# Patient Record
Sex: Female | Born: 1965
Health system: Southern US, Community
[De-identification: ages and names within clinical notes are randomized; demographics above are authoritative.]

## PROBLEM LIST (undated history)

## (undated) DIAGNOSIS — G8929 Other chronic pain: Secondary | ICD-10-CM

## (undated) DIAGNOSIS — IMO0002 Reserved for concepts with insufficient information to code with codable children: Secondary | ICD-10-CM

## (undated) DIAGNOSIS — F419 Anxiety disorder, unspecified: Secondary | ICD-10-CM

## (undated) DIAGNOSIS — L719 Rosacea, unspecified: Secondary | ICD-10-CM

## (undated) DIAGNOSIS — R519 Headache, unspecified: Secondary | ICD-10-CM

## (undated) DIAGNOSIS — M533 Sacrococcygeal disorders, not elsewhere classified: Secondary | ICD-10-CM

## (undated) DIAGNOSIS — N393 Stress incontinence (female) (male): Secondary | ICD-10-CM

## (undated) DIAGNOSIS — Z9989 Dependence on other enabling machines and devices: Secondary | ICD-10-CM

## (undated) DIAGNOSIS — J45998 Other asthma: Secondary | ICD-10-CM

## (undated) DIAGNOSIS — N920 Excessive and frequent menstruation with regular cycle: Secondary | ICD-10-CM

## (undated) DIAGNOSIS — G2581 Restless legs syndrome: Secondary | ICD-10-CM

## (undated) DIAGNOSIS — K219 Gastro-esophageal reflux disease without esophagitis: Secondary | ICD-10-CM

## (undated) DIAGNOSIS — M545 Low back pain, unspecified: Secondary | ICD-10-CM

## (undated) DIAGNOSIS — R112 Nausea with vomiting, unspecified: Secondary | ICD-10-CM

## (undated) DIAGNOSIS — E785 Hyperlipidemia, unspecified: Secondary | ICD-10-CM

## (undated) DIAGNOSIS — N390 Urinary tract infection, site not specified: Secondary | ICD-10-CM

## (undated) DIAGNOSIS — Z87442 Personal history of urinary calculi: Secondary | ICD-10-CM

## (undated) DIAGNOSIS — R9082 White matter disease, unspecified: Secondary | ICD-10-CM

## (undated) DIAGNOSIS — N3010 Interstitial cystitis (chronic) without hematuria: Secondary | ICD-10-CM

## (undated) DIAGNOSIS — Z8249 Family history of ischemic heart disease and other diseases of the circulatory system: Secondary | ICD-10-CM

## (undated) DIAGNOSIS — M797 Fibromyalgia: Secondary | ICD-10-CM

## (undated) DIAGNOSIS — E119 Type 2 diabetes mellitus without complications: Secondary | ICD-10-CM

## (undated) DIAGNOSIS — K589 Irritable bowel syndrome without diarrhea: Secondary | ICD-10-CM

## (undated) DIAGNOSIS — M1990 Unspecified osteoarthritis, unspecified site: Secondary | ICD-10-CM

## (undated) DIAGNOSIS — Z8659 Personal history of other mental and behavioral disorders: Secondary | ICD-10-CM

## (undated) DIAGNOSIS — H01009 Unspecified blepharitis unspecified eye, unspecified eyelid: Secondary | ICD-10-CM

## (undated) DIAGNOSIS — F329 Major depressive disorder, single episode, unspecified: Secondary | ICD-10-CM

## (undated) DIAGNOSIS — J189 Pneumonia, unspecified organism: Secondary | ICD-10-CM

## (undated) DIAGNOSIS — Z9889 Other specified postprocedural states: Secondary | ICD-10-CM

## (undated) DIAGNOSIS — G4733 Obstructive sleep apnea (adult) (pediatric): Secondary | ICD-10-CM

## (undated) DIAGNOSIS — R51 Headache: Secondary | ICD-10-CM

## (undated) DIAGNOSIS — F32A Depression, unspecified: Secondary | ICD-10-CM

## (undated) DIAGNOSIS — M329 Systemic lupus erythematosus, unspecified: Secondary | ICD-10-CM

## (undated) HISTORY — DX: Pneumonia, unspecified organism: J18.9

## (undated) HISTORY — DX: Family history of ischemic heart disease and other diseases of the circulatory system: Z82.49

## (undated) HISTORY — DX: Gastro-esophageal reflux disease without esophagitis: K21.9

## (undated) HISTORY — DX: Other chronic pain: G89.29

## (undated) HISTORY — DX: Unspecified blepharitis unspecified eye, unspecified eyelid: H01.009

## (undated) HISTORY — PX: TONSILLECTOMY: SUR1361

## (undated) HISTORY — DX: Depression, unspecified: F32.A

## (undated) HISTORY — DX: Fibromyalgia: M79.7

## (undated) HISTORY — DX: Low back pain, unspecified: M54.50

## (undated) HISTORY — DX: Type 2 diabetes mellitus without complications: E11.9

## (undated) HISTORY — DX: Irritable bowel syndrome, unspecified: K58.9

## (undated) HISTORY — DX: Restless legs syndrome: G25.81

## (undated) HISTORY — DX: Anxiety disorder, unspecified: F41.9

## (undated) HISTORY — DX: White matter disease, unspecified: R90.82

## (undated) HISTORY — DX: Interstitial cystitis (chronic) without hematuria: N30.10

## (undated) HISTORY — PX: ELBOW SURGERY: SHX618

## (undated) HISTORY — DX: Major depressive disorder, single episode, unspecified: F32.9

## (undated) HISTORY — DX: Personal history of urinary calculi: Z87.442

## (undated) HISTORY — DX: Hyperlipidemia, unspecified: E78.5

## (undated) HISTORY — DX: Unspecified osteoarthritis, unspecified site: M19.90

## (undated) HISTORY — DX: Rosacea, unspecified: L71.9

## (undated) HISTORY — PX: OTHER SURGICAL HISTORY: SHX169

## (undated) HISTORY — DX: Low back pain: M54.5

---

## 1993-09-23 HISTORY — PX: TUBAL LIGATION: SHX77

## 2002-09-23 HISTORY — PX: SHOULDER OPEN ROTATOR CUFF REPAIR: SHX2407

## 2002-11-11 ENCOUNTER — Encounter: Admission: RE | Admit: 2002-11-11 | Discharge: 2002-11-11 | Payer: Self-pay

## 2003-09-24 HISTORY — PX: OTHER SURGICAL HISTORY: SHX169

## 2004-08-12 ENCOUNTER — Emergency Department: Payer: Self-pay | Admitting: Unknown Physician Specialty

## 2004-10-15 ENCOUNTER — Encounter
Admission: RE | Admit: 2004-10-15 | Discharge: 2005-01-13 | Payer: Self-pay | Admitting: Physical Medicine & Rehabilitation

## 2004-10-17 ENCOUNTER — Ambulatory Visit: Payer: Self-pay | Admitting: Physical Medicine & Rehabilitation

## 2004-12-14 ENCOUNTER — Ambulatory Visit: Payer: Self-pay | Admitting: Physical Medicine & Rehabilitation

## 2004-12-28 ENCOUNTER — Encounter
Admission: RE | Admit: 2004-12-28 | Discharge: 2005-03-28 | Payer: Self-pay | Admitting: Physical Medicine & Rehabilitation

## 2004-12-28 ENCOUNTER — Ambulatory Visit: Payer: Self-pay | Admitting: Psychology

## 2005-01-23 ENCOUNTER — Encounter
Admission: RE | Admit: 2005-01-23 | Discharge: 2005-04-23 | Payer: Self-pay | Admitting: Physical Medicine & Rehabilitation

## 2005-02-05 ENCOUNTER — Ambulatory Visit: Payer: Self-pay | Admitting: Physical Medicine & Rehabilitation

## 2005-02-07 ENCOUNTER — Ambulatory Visit: Payer: Self-pay | Admitting: Psychology

## 2005-03-15 ENCOUNTER — Ambulatory Visit: Payer: Self-pay | Admitting: Psychology

## 2005-04-04 ENCOUNTER — Ambulatory Visit: Payer: Self-pay | Admitting: Physical Medicine & Rehabilitation

## 2005-04-17 ENCOUNTER — Ambulatory Visit: Payer: Self-pay | Admitting: Psychology

## 2005-04-29 ENCOUNTER — Ambulatory Visit: Payer: Self-pay | Admitting: Psychology

## 2005-05-17 ENCOUNTER — Encounter
Admission: RE | Admit: 2005-05-17 | Discharge: 2005-08-15 | Payer: Self-pay | Admitting: Physical Medicine & Rehabilitation

## 2005-05-17 ENCOUNTER — Ambulatory Visit: Payer: Self-pay | Admitting: Physical Medicine & Rehabilitation

## 2005-05-22 ENCOUNTER — Encounter
Admission: RE | Admit: 2005-05-22 | Discharge: 2005-08-20 | Payer: Self-pay | Admitting: Physical Medicine & Rehabilitation

## 2005-06-12 ENCOUNTER — Ambulatory Visit: Payer: Self-pay | Admitting: Psychology

## 2005-07-08 ENCOUNTER — Ambulatory Visit: Payer: Self-pay | Admitting: Psychology

## 2005-08-02 ENCOUNTER — Ambulatory Visit: Payer: Self-pay | Admitting: Psychology

## 2005-08-26 ENCOUNTER — Encounter
Admission: RE | Admit: 2005-08-26 | Discharge: 2005-11-24 | Payer: Self-pay | Admitting: Physical Medicine & Rehabilitation

## 2005-08-26 ENCOUNTER — Ambulatory Visit: Payer: Self-pay | Admitting: Physical Medicine & Rehabilitation

## 2005-10-31 ENCOUNTER — Ambulatory Visit: Payer: Self-pay | Admitting: Physical Medicine & Rehabilitation

## 2005-12-05 ENCOUNTER — Encounter
Admission: RE | Admit: 2005-12-05 | Discharge: 2006-03-05 | Payer: Self-pay | Admitting: Physical Medicine & Rehabilitation

## 2005-12-05 ENCOUNTER — Ambulatory Visit: Payer: Self-pay | Admitting: Physical Medicine & Rehabilitation

## 2006-01-02 ENCOUNTER — Ambulatory Visit: Payer: Self-pay | Admitting: Physical Medicine & Rehabilitation

## 2006-01-30 ENCOUNTER — Ambulatory Visit: Payer: Self-pay | Admitting: Cardiology

## 2006-02-04 ENCOUNTER — Ambulatory Visit: Payer: Self-pay | Admitting: Cardiology

## 2006-03-04 ENCOUNTER — Encounter: Payer: Self-pay | Admitting: Cardiology

## 2006-03-04 ENCOUNTER — Ambulatory Visit: Payer: Self-pay

## 2006-03-06 ENCOUNTER — Encounter
Admission: RE | Admit: 2006-03-06 | Discharge: 2006-06-04 | Payer: Self-pay | Admitting: Physical Medicine & Rehabilitation

## 2006-03-06 ENCOUNTER — Ambulatory Visit: Payer: Self-pay | Admitting: Physical Medicine & Rehabilitation

## 2006-03-07 ENCOUNTER — Ambulatory Visit: Payer: Self-pay | Admitting: Cardiology

## 2006-04-29 ENCOUNTER — Ambulatory Visit: Payer: Self-pay | Admitting: Physical Medicine & Rehabilitation

## 2006-06-07 HISTORY — PX: TRANSTHORACIC ECHOCARDIOGRAM: SHX275

## 2006-06-27 ENCOUNTER — Encounter
Admission: RE | Admit: 2006-06-27 | Discharge: 2006-09-25 | Payer: Self-pay | Admitting: Physical Medicine & Rehabilitation

## 2006-06-27 ENCOUNTER — Ambulatory Visit: Payer: Self-pay | Admitting: Physical Medicine & Rehabilitation

## 2006-08-22 ENCOUNTER — Ambulatory Visit: Payer: Self-pay | Admitting: Physical Medicine & Rehabilitation

## 2006-10-17 ENCOUNTER — Encounter
Admission: RE | Admit: 2006-10-17 | Discharge: 2007-01-15 | Payer: Self-pay | Admitting: Physical Medicine & Rehabilitation

## 2006-10-17 ENCOUNTER — Ambulatory Visit: Payer: Self-pay | Admitting: Physical Medicine & Rehabilitation

## 2006-10-27 ENCOUNTER — Ambulatory Visit (HOSPITAL_BASED_OUTPATIENT_CLINIC_OR_DEPARTMENT_OTHER): Admission: RE | Admit: 2006-10-27 | Discharge: 2006-10-27 | Payer: Self-pay | Admitting: Urology

## 2006-11-03 ENCOUNTER — Emergency Department: Payer: Self-pay | Admitting: Emergency Medicine

## 2006-11-05 ENCOUNTER — Ambulatory Visit: Payer: Self-pay | Admitting: Family Medicine

## 2007-01-14 ENCOUNTER — Ambulatory Visit: Payer: Self-pay | Admitting: Physical Medicine & Rehabilitation

## 2007-01-14 ENCOUNTER — Encounter
Admission: RE | Admit: 2007-01-14 | Discharge: 2007-04-14 | Payer: Self-pay | Admitting: Physical Medicine & Rehabilitation

## 2007-01-28 ENCOUNTER — Encounter
Admission: RE | Admit: 2007-01-28 | Discharge: 2007-04-28 | Payer: Self-pay | Admitting: Physical Medicine & Rehabilitation

## 2007-02-13 ENCOUNTER — Ambulatory Visit: Payer: Self-pay | Admitting: Psychology

## 2007-02-13 ENCOUNTER — Encounter: Admission: RE | Admit: 2007-02-13 | Discharge: 2007-05-14 | Payer: Self-pay | Admitting: Physician Assistant

## 2007-04-01 ENCOUNTER — Ambulatory Visit: Payer: Self-pay | Admitting: Psychology

## 2007-04-13 ENCOUNTER — Encounter
Admission: RE | Admit: 2007-04-13 | Discharge: 2007-04-14 | Payer: Self-pay | Admitting: Physical Medicine & Rehabilitation

## 2007-04-14 ENCOUNTER — Ambulatory Visit: Payer: Self-pay | Admitting: Physical Medicine & Rehabilitation

## 2007-04-16 ENCOUNTER — Encounter: Admission: RE | Admit: 2007-04-16 | Discharge: 2007-07-15 | Payer: Self-pay | Admitting: Psychology

## 2007-07-09 ENCOUNTER — Encounter
Admission: RE | Admit: 2007-07-09 | Discharge: 2007-09-01 | Payer: Self-pay | Admitting: Physical Medicine & Rehabilitation

## 2007-07-09 ENCOUNTER — Ambulatory Visit: Payer: Self-pay | Admitting: Physical Medicine & Rehabilitation

## 2007-10-01 ENCOUNTER — Encounter
Admission: RE | Admit: 2007-10-01 | Discharge: 2007-12-30 | Payer: Self-pay | Admitting: Physical Medicine & Rehabilitation

## 2007-10-01 ENCOUNTER — Ambulatory Visit: Payer: Self-pay | Admitting: Physical Medicine & Rehabilitation

## 2007-11-12 ENCOUNTER — Encounter: Admission: RE | Admit: 2007-11-12 | Discharge: 2008-02-10 | Payer: Self-pay | Admitting: Psychology

## 2007-11-12 ENCOUNTER — Ambulatory Visit: Payer: Self-pay | Admitting: Psychology

## 2007-11-25 ENCOUNTER — Ambulatory Visit: Payer: Self-pay | Admitting: Physical Medicine & Rehabilitation

## 2007-11-27 ENCOUNTER — Encounter: Payer: Self-pay | Admitting: Internal Medicine

## 2007-12-24 ENCOUNTER — Encounter
Admission: RE | Admit: 2007-12-24 | Discharge: 2007-12-24 | Payer: Self-pay | Admitting: Physical Medicine & Rehabilitation

## 2007-12-29 ENCOUNTER — Ambulatory Visit: Payer: Self-pay | Admitting: Physical Medicine & Rehabilitation

## 2007-12-30 ENCOUNTER — Ambulatory Visit (HOSPITAL_BASED_OUTPATIENT_CLINIC_OR_DEPARTMENT_OTHER): Admission: RE | Admit: 2007-12-30 | Discharge: 2007-12-30 | Payer: Self-pay | Admitting: Urology

## 2008-02-09 ENCOUNTER — Ambulatory Visit: Payer: Self-pay | Admitting: Psychology

## 2008-03-18 ENCOUNTER — Encounter
Admission: RE | Admit: 2008-03-18 | Discharge: 2008-04-13 | Payer: Self-pay | Admitting: Physical Medicine & Rehabilitation

## 2008-03-22 ENCOUNTER — Ambulatory Visit: Payer: Self-pay | Admitting: Physical Medicine & Rehabilitation

## 2008-05-23 ENCOUNTER — Encounter
Admission: RE | Admit: 2008-05-23 | Discharge: 2008-05-24 | Payer: Self-pay | Admitting: Physical Medicine & Rehabilitation

## 2008-05-24 ENCOUNTER — Ambulatory Visit: Payer: Self-pay | Admitting: Physical Medicine & Rehabilitation

## 2008-07-21 ENCOUNTER — Encounter: Admission: RE | Admit: 2008-07-21 | Discharge: 2008-10-19 | Payer: Self-pay | Admitting: Psychology

## 2008-07-21 ENCOUNTER — Ambulatory Visit: Payer: Self-pay | Admitting: Psychology

## 2008-09-12 ENCOUNTER — Encounter
Admission: RE | Admit: 2008-09-12 | Discharge: 2008-09-13 | Payer: Self-pay | Admitting: Physical Medicine & Rehabilitation

## 2008-09-13 ENCOUNTER — Ambulatory Visit: Payer: Self-pay | Admitting: Physical Medicine & Rehabilitation

## 2008-09-30 ENCOUNTER — Ambulatory Visit: Payer: Self-pay | Admitting: Psychology

## 2008-12-06 ENCOUNTER — Encounter
Admission: RE | Admit: 2008-12-06 | Discharge: 2008-12-07 | Payer: Self-pay | Admitting: Physical Medicine & Rehabilitation

## 2008-12-07 ENCOUNTER — Ambulatory Visit: Payer: Self-pay | Admitting: Physical Medicine & Rehabilitation

## 2009-02-22 ENCOUNTER — Ambulatory Visit: Payer: Self-pay | Admitting: Physical Medicine & Rehabilitation

## 2009-02-22 ENCOUNTER — Encounter
Admission: RE | Admit: 2009-02-22 | Discharge: 2009-05-23 | Payer: Self-pay | Admitting: Physical Medicine & Rehabilitation

## 2009-03-21 ENCOUNTER — Ambulatory Visit: Payer: Self-pay | Admitting: Physical Medicine & Rehabilitation

## 2009-06-06 ENCOUNTER — Encounter
Admission: RE | Admit: 2009-06-06 | Discharge: 2009-06-09 | Payer: Self-pay | Admitting: Physical Medicine & Rehabilitation

## 2009-06-09 ENCOUNTER — Ambulatory Visit: Payer: Self-pay | Admitting: Physical Medicine & Rehabilitation

## 2009-08-14 ENCOUNTER — Encounter
Admission: RE | Admit: 2009-08-14 | Discharge: 2009-09-20 | Payer: Self-pay | Admitting: Physical Medicine & Rehabilitation

## 2009-08-29 ENCOUNTER — Encounter
Admission: RE | Admit: 2009-08-29 | Discharge: 2009-09-20 | Payer: Self-pay | Admitting: Physical Medicine & Rehabilitation

## 2009-08-30 ENCOUNTER — Ambulatory Visit: Payer: Self-pay | Admitting: Physical Medicine & Rehabilitation

## 2009-09-27 ENCOUNTER — Encounter
Admission: RE | Admit: 2009-09-27 | Discharge: 2009-12-26 | Payer: Self-pay | Admitting: Physical Medicine & Rehabilitation

## 2009-11-20 ENCOUNTER — Encounter
Admission: RE | Admit: 2009-11-20 | Discharge: 2009-11-23 | Payer: Self-pay | Admitting: Physical Medicine & Rehabilitation

## 2009-11-22 ENCOUNTER — Ambulatory Visit: Payer: Self-pay | Admitting: Physical Medicine & Rehabilitation

## 2010-03-07 ENCOUNTER — Encounter
Admission: RE | Admit: 2010-03-07 | Discharge: 2010-03-14 | Payer: Self-pay | Admitting: Physical Medicine & Rehabilitation

## 2010-03-14 ENCOUNTER — Ambulatory Visit: Payer: Self-pay | Admitting: Physical Medicine & Rehabilitation

## 2010-06-01 ENCOUNTER — Encounter
Admission: RE | Admit: 2010-06-01 | Discharge: 2010-06-06 | Payer: Self-pay | Source: Home / Self Care | Attending: Physical Medicine & Rehabilitation | Admitting: Physical Medicine & Rehabilitation

## 2010-06-06 ENCOUNTER — Ambulatory Visit: Payer: Self-pay | Admitting: Physical Medicine & Rehabilitation

## 2010-07-16 ENCOUNTER — Ambulatory Visit (HOSPITAL_BASED_OUTPATIENT_CLINIC_OR_DEPARTMENT_OTHER): Admission: RE | Admit: 2010-07-16 | Discharge: 2010-07-16 | Payer: Self-pay | Admitting: Urology

## 2010-09-19 ENCOUNTER — Encounter: Payer: Self-pay | Admitting: Cardiology

## 2010-09-24 ENCOUNTER — Encounter
Admission: RE | Admit: 2010-09-24 | Discharge: 2010-09-26 | Payer: Self-pay | Source: Home / Self Care | Attending: Physical Medicine & Rehabilitation | Admitting: Physical Medicine & Rehabilitation

## 2010-09-26 ENCOUNTER — Ambulatory Visit
Admission: RE | Admit: 2010-09-26 | Discharge: 2010-09-26 | Payer: Self-pay | Source: Home / Self Care | Attending: Physical Medicine & Rehabilitation | Admitting: Physical Medicine & Rehabilitation

## 2010-10-02 ENCOUNTER — Encounter: Payer: Self-pay | Admitting: Cardiology

## 2010-10-03 ENCOUNTER — Ambulatory Visit
Admission: RE | Admit: 2010-10-03 | Discharge: 2010-10-03 | Payer: Self-pay | Source: Home / Self Care | Attending: Cardiology | Admitting: Cardiology

## 2010-10-03 DIAGNOSIS — E669 Obesity, unspecified: Secondary | ICD-10-CM | POA: Insufficient documentation

## 2010-10-03 DIAGNOSIS — F411 Generalized anxiety disorder: Secondary | ICD-10-CM | POA: Insufficient documentation

## 2010-10-03 DIAGNOSIS — R079 Chest pain, unspecified: Secondary | ICD-10-CM | POA: Insufficient documentation

## 2010-10-12 HISTORY — PX: OTHER SURGICAL HISTORY: SHX169

## 2010-10-22 ENCOUNTER — Ambulatory Visit
Admission: RE | Admit: 2010-10-22 | Discharge: 2010-10-22 | Payer: Self-pay | Source: Home / Self Care | Attending: Cardiology | Admitting: Cardiology

## 2010-10-25 NOTE — Miscellaneous (Signed)
  Clinical Lists Changes  Observations: Added new observation of ECHOINTERP:   -  Left ventricular ejection fraction was estimated to be 65 %.         There were no left ventricular regional wall motion         abnormalities.   -  No significant abnormalities noted.   IMPRESSIONS   -  No significant abnormalities noted. (03/04/2006 15:01)      Echocardiogram  Procedure date:  03/04/2006  Findings:        -  Left ventricular ejection fraction was estimated to be 65 %.         There were no left ventricular regional wall motion         abnormalities.   -  No significant abnormalities noted.   IMPRESSIONS   -  No significant abnormalities noted.

## 2010-10-25 NOTE — Letter (Signed)
Summary: [facility]   Imported By: Marylou Mccoy 10/16/2010 15:33:36  _____________________________________________________________________  External Attachment:    Type:   Image     Comment:   External Document

## 2010-10-25 NOTE — Assessment & Plan Note (Signed)
Summary: np6.  chestpain. pt has medicade. gd   Visit Type:  Initial Consult Primary Provider:  Lonie Peak PAc  CC:  chest pain.  History of Present Illness: The patient presents for evaluation of chest pain. She reports that she has had a stress test many years ago for chest pain and also had some palpitations. However, otherwise she has not had significant cardiovascular problems or testing. She reports chest discomfort happening recently 2-3 times. She points to her left-sided discomfort that has been severe. He had tingling down into her left arm. It happens at rest. It is sharp. There is associated diaphoresis and nausea. It eventually will go away after about 15 minutes. It has been 10 out of 10. It is not like previous reflux. She cannot bring this on. She has not taken anything to try to get rid of it.  Current Medications (verified): 1)  Prilosec 20 Mg Cpdr (Omeprazole) .[name].. 1 By Mouth Daily 2)  Bentyl 20 Mg Tabs (Dicyclomine Hcl) .... As Needed 3)  Alprazolam 1 Mg Tabs (Alprazolam) .[name].. 1 By Mouth Daily 4)  Proventil Hfa 108 (90 Base) Mcg/act Aers (Albuterol Sulfate) .... As Needed 5)  Cetirizine Hcl 10 Mg Tabs (Cetirizine Hcl) .[name].. 1 By Mouth Daily 6)  Phenergan 25 Mg/ml Soln (Promethazine Hcl) .... As Needed 7)  Meclizine Hcl 25 Mg Tabs (Meclizine Hcl) .[name].. 1 As Needed 8)  Flexeril 10 Mg Tabs (Cyclobenzaprine Hcl) .... As Needed 9)  Pulmicort Flexhaler 180 Mcg/act Aepb (Budesonide) .... Uad 10)  Maxalt 10 Mg Tabs (Rizatriptan Benzoate) .... As Needed 11)  Topamax 50 Mg Tabs (Topiramate) .[name].. 1 By Mouth Daily 12)  Dhea 25 Mg Tabs (Prasterone (Dhea)) .... 2 By Mouth Daily 13)  Magnesium 250 Mg Tabs (Magnesium) .[name].. 1 By Mouth Daily 14)  Celexa 20 Mg Tabs (Citalopram Hydrobromide) .[name].. 1 By Mouth Daily 15)  Vitamin D 2000 Unit Tabs (Cholecalciferol) .[name].. 1 By Mouth Daily 16)  Norco 10-325 Mg Tabs (Hydrocodone-Acetaminophen) .... As Needed  Allergies (verified): No Known Drug  Allergies  Past History:  Past Medical History: Anxiety/depression Fibromyalgia Interstial cystitis Nephrolithiasis  Past Surgical History: Tubal ligation Rotator cuff surgery Cystoscopies  Family History: The patient has no family history of early onset coronary disease though she does not know her father's medical history.  Social History: Patient lives at home with her parents and 3 children. She has never smoked cigarettes. She does not drink.  Review of Systems       Positive for headaches, dizziness, cough, reflux, nighttime leg cramping  Vital Signs:  Patient profile:   45 year old female Height:      63 inches Weight:      231 pounds BMI:     41.07 Pulse rate:   80 / minute Resp:     16 per minute BP sitting:   131 / 94  (right arm)  Vitals Entered By: Marrion Coy, CNA (October 03, 2010 2:37 PM)  Physical Exam  General:  Well developed, well nourished, in no acute distress. Head:  normocephalic and atraumatic Eyes:  PERRLA/EOM intact; conjunctiva and lids normal. Mouth:  Poor dentition otherwise unremarkable Neck:  Neck supple, no JVD. No masses, thyromegaly or abnormal cervical nodes. Chest Wall:  no deformities or breast masses noted Lungs:  Clear bilaterally to auscultation and percussion. Abdomen:  Bowel sounds positive; abdomen soft and non-tender without masses, organomegaly, or hernias noted. No hepatosplenomegaly. Msk:  Back normal, normal gait. Muscle strength and tone normal.  Extremities:  No clubbing or cyanosis. Neurologic:  Alert and oriented x 3. Skin:  Intact without lesions or rashes. Cervical Nodes:  no significant adenopathy Axillary Nodes:  no significant adenopathy Inguinal Nodes:  no significant adenopathy Psych:  Normal affect.   Detailed Cardiovascular Exam  Neck    Carotids: Carotids full and equal bilaterally without bruits.      Neck Veins: Normal, no JVD.    Heart    Inspection: no deformities or lifts noted.       Palpation: normal PMI with no thrills palpable.      Auscultation: regular rate and rhythm, S1, S2 without murmurs, rubs, gallops, or clicks.    Vascular    Abdominal Aorta: no palpable masses, pulsations, or audible bruits.      Femoral Pulses: normal femoral pulses bilaterally.      Pedal Pulses: normal pedal pulses bilaterally.      Radial Pulses: normal radial pulses bilaterally.      Peripheral Circulation: no clubbing, cyanosis, or edema noted with normal capillary refill.     EKG  Procedure date:  09/19/2010  Findings:      Sinus rhythm, rate 70, axis within normal limits, intervals within normal limits, no acute ST-T wave changes.  Impression & Recommendations:  Problem # 1:  CHEST PAIN, PRECORDIAL (ICD-786.51) The chest pain is atypical for the most part. I think screening with an exercise treadmill test is indicated. She is worried about this because she would get dizzy when trying to work with her to get this accomplished. If this is negative I would refer her back to her primary provider for consideration of GI evaluation. Orders: Treadmill (Treadmill)  Problem # 2:  OBESITY, UNSPECIFIED (ICD-278.00) We will further discuss weight loss with diet and exercise.  Problem # 3:  GENERALIZED ANXIETY DISORDER (AVW-098.11) This certainly could contribute to her chest pain. However, in discussing this with her she has wonderful followup and treatment of this.  Patient Instructions: 1)  Your physician recommends that you continue on your current medications as directed. Please refer to the Current Medication list given to you today. 2)  Your physician has requested that you have an exercise tolerance test.  For further information please visit [email or web address]  Please also follow instruction sheet, as given.

## 2010-11-07 ENCOUNTER — Telehealth (INDEPENDENT_AMBULATORY_CARE_PROVIDER_SITE_OTHER): Payer: Self-pay | Admitting: *Deleted

## 2010-11-14 NOTE — Progress Notes (Signed)
  Phone Note Other Incoming   Request: Send information Summary of Call: Request for records received from [employer]. Request forwarded to [employer].  09/23/2010 to present.

## 2010-12-05 LAB — POCT PREGNANCY, URINE: Preg Test, Ur: NEGATIVE

## 2010-12-05 LAB — POCT HEMOGLOBIN-HEMACUE: Hemoglobin: 13.9 g/dL (ref 12.0–15.0)

## 2011-02-05 NOTE — Assessment & Plan Note (Signed)
Karina Greer is back regarding her fibromyalgia syndrome.  She did not  tolerate the Savella due to bleeding from the mouth and nose as well as  rectal region.  Apparently, after stopping the medication, she had no  further bleeding.  Other workup was negative for sources of bleeding per  her PCP.  She continues the treatment per Dr. [name].  She is getting  Elmiron daily, which is helping somewhat with spasm.  Sleep is somewhat  improved.  She is working on exercise, but really not walking more than  5 or 10 minutes and is inconsistent still.  She has attempted some  socialization, but still has lot of anxiety blocking her way.  She rates  her pain as 7-8/10.  She describes pain is sharp, burning, stabbing,  aching, and constant.  Pain interferes with general activity, in  relationship with others, and enjoyment of life on a moderate-to-severe  level.   REVIEW OF SYSTEMS:  Notable for bladder, throat problems, numbness,  tremor, tingling, depression, confusion, dizziness, and anxiety.  She  has occasional abdominal pain and problem with appetite.  Full review is  in the written health and history section.   SOCIAL HISTORY:  The patient is divorced and lives with her children.  Other pertinent positives are above.   PHYSICAL EXAMINATION:  VITAL SIGNS:  Blood pressure is 134/59, pulse is  77, respiratory rate 18, and she is sating 99% on room air.  GENERAL:  The patient is pleasant, alert, and oriented x3.  Affect is  generally bright.  She did become tearful at times when talking about  her psychological issues, but overall, she showed better awareness  today.  She has some diffuse tenderness along the elbows, wrists,  shoulders, knees, neck, head, etc.  Although, insight was improved  today, memory can be a problem at times overall with better insight and  attention.  She has better recall.  Cranial nerve exam is intact.  Gait  stable.  Weight appeared to be unchanged from last visit.   ASSESSMENT:  1. Fibromyalgia syndrome.  2. Insomnia.  3. Interstitial cystitis.  4. Anxiety/depression.  5. Sacroiliac joint disorder.  6. Irritable bowel syndrome.  7. Costochondritis.   PLAN:  1. Continue exercise.  We discussed short-term and long-term stepwise      approach to her exercise.  I even would like to see her add some      weightlifting in at some point.  She needs to look on this now      rather than waiting.  I recommended keeping records of her      progress, etc.  We also discussed socialization and getting on the      Community, and this needs to begin as well.  She will continue with      Dr. Leonides Cave.  Followup as needed.  2. Follow up with Dr. [name] for urological management.  3. We will maintain Tofranil 150 mg nightly and the ReQuip at bedtime.  4. Refill Norco 10/325, #90.  5. Restart Celexa 20 mg for 3 days, 40 mg for 4 days, and then 60 mg      daily thereafter.  6. I will see her back in 4 months.      [name], M.D.  Electronically Signed     ZTS/MedQ  D:  05/24/2008 10:31:42  T:  05/25/2008 01:35:21  Job #:  [id number]   cc:   [name], M.D.  Fax: [phone]

## 2011-02-05 NOTE — Assessment & Plan Note (Signed)
HISTORY:  Karina Greer is back regarding her fibromyalgia.  She had hydraulic  bladder distention in April by Dr. [name].  She is on Elmiron treatments  now.  She still notes spastic symptoms of her bladder.  She follows up  with Dr. [name] next month.  She sees Dr. Leonides Cave for counseling.  She  has been more engaged in her diagnosis and the reasons behind her  problems.  She seems to be exercising a bit more and has better  realization of things.  She still does not do much socializing.  She  complains of left lower quadrant abdominal pain.  She wonders if it is  her kidneys or gallbladder, etc.  She rates her pain at 8/10 on average,  describes as constant, tingling, and aching.  She reports some  intermittent stabbing and stinging pain to the arms and legs.  Sleep is  poor, which she attributes to the pain in the left lower quadrant at  times.  She also has an anxiety at night.   REVIEW OF SYSTEMS:  Notable for the above as well as dizziness,  confusion, depression, constipation, abdominal pain, and diarrhea.  Full  review is in the written health and history section in the chart.   SOCIAL HISTORY:  The patient is single, living with her children.   PHYSICAL EXAMINATION:  VITAL SIGNS:  Blood pressure is 132/75, pulse 70,  and respiratory rate 20.  She is sating 99% on room air.  Weight is  stable  GENERAL:  The patient is a bit more alert.  MUSCULOSKELETAL:  She is able to ambulate and move without significant  issues today.  She has one topic and has better insight and awareness  overall today.  She has some generalized tenderness in the extremities  and trunk today.  No focal myofascial trigger points.  NEUROLOGICAL:  Cranial nerve exam is intact.   ASSESSMENT:  1. Fibromyalgia syndrome.  2. Insomnia.  3. Interstitial cystitis.  4. Irritable bowel syndrome.  5. Anxiety with depression.  6. Sacroiliac joint dysfunction.  7. Costochondritis.   PLAN:  1. Continue Elmiron per Dr.  [name].  2. Maintain Tofranil and ReQuip at current doses.  3. Norco 10/325 for breakthrough pain.  4. We will wean off Celexa and begin trial of Savella.  She was given      a starter pack.  Ultimately, we will titrate slowly up to 50 mg      b.i.d. if she is able to tolerate.  5. Continue counseling.  I wanted to focus more on goal in social-      related behavior.      [name], M.D.  Electronically Signed    ZTS/MedQ  D:  03/22/2008 10:06:51  T:  03/23/2008 02:34:56  Job #:  [id number]   cc:   [name], M.D.  Fax: [phone]

## 2011-02-05 NOTE — Assessment & Plan Note (Signed)
Karina Greer is back regarding her fibromyalgia.  She has been having more  problems with her interstitial cystitis as of late.  She is receiving  pelvic floor treatments at the outpatient center per Dr. Ellin Goodie  instruction.  She uses the Elmiron bladder washes, too, with some  results.  Sleep is poor.  Her energy is poor as well as her mood.  She  has gone back to seen Dr. Leonides Cave, and she prefers him over her prior  psychologist.  The patient uses Norco for breakthrough pain.  She is  using less Phenergan now due to decreased nausea.   REVIEW OF SYSTEMS:  Notable for the above as well as irritable bowel  symptoms.  Tremor is better with the Requip.  She still has some anxiety  issues, shortness of breath, and weight gain.  She is not eating much  yet still seems to be gaining weight.   SOCIAL HISTORY:  Notable for patient going through a divorce.  She is  living with her children currently.   PHYSICAL EXAMINATION:  Blood pressure is 128/85, pulse is 85,  respiratory rate 18.  She is satting 97% on room air.  The patient is pleasant, alert and oriented x3.  Affect is a bit flat.  She does appear to have gained weight.  She is still a bit anxious at  times as well.  She has tenderness throughout chest, arms, trunk today, etc.  HEART:  Regular.  CHEST:  Clear.  ABDOMEN:  Soft, nontender.   ASSESSMENT:  1. Fibromyalgia syndrome.  2. Insomnia.  3. Severe anxiety and depression.  4. Questionable asthma.  5. Sacroiliac disorder.  6. Interstitial cystitis/irritable bowel syndrome.   PLAN:  1. Will begin a trial of Mestinon 30 mg b.i.d. in the morning and      after lunch to see if this improves stamina and pain tolerance.  We      discussed effects and side effects including nausea, diarrhea,      dizziness, low blood pressure, and bradycardia.  The patient was      willing to try the medication despite these possibilities.  2. Begin a trial of Tofranil for sleep and mood at 75 mg  nightly.  3. Continue Requip at bedtime.  4. Refill Norco 10/325.  5. Continue pelvic floor treatments and followup with Dr. [name].  6. I will see her back in 2 months' time.      [name], M.D.  Electronically Signed     ZTS/MedQ  D:  04/14/2007 09:24:52  T:  04/14/2007 12:09:42  Job #:  [id number]

## 2011-02-05 NOTE — Assessment & Plan Note (Signed)
HISTORY OF PRESENT ILLNESS:  Karina Greer states that she has been doing a bit  better with the Requip. She did not have a good response to the Tofranil  or Mestinon but did not have any adverse effects.  She has had some  recent sternal pain and wonders if it is related to a cough or not.  It  may have just started before her cough worsened. She rates her pain as 7  to 9/10.  Described as sharp, intermittently, constant, tingling.  The  pain interferes with her general activities and relations with others  and enjoyment of life in a moderate to severe level.  She uses Vicodin  for break through pain.  The patient feels limited with her exercise.  She occasionally gets dizzy.  She wants to get back to seeing Dr. Leonides Cave  again but has some issues getting back and forth to his office.   REVIEW OF SYSTEMS:  The patient reports bladder control issues. She is  seeing urology for this.  She needs to follow up with them.  Apparently  she did some pelvic floor exercises which did not make much of a  difference for her. She complains of tremor, trouble walking, spasms,  abdominal pain, poor appetite, shortness of breath.  Other pertinent  positives as listed above.   SOCIAL HISTORY:  The patient is divorced, living with her children.   PHYSICAL EXAMINATION:  VITAL SIGNS:  Blood pressure 140/80.  Pulse 92.  Respiratory rate 18.  She is sating 99 percent on room air.  GENERAL:  The patient is pleasant.  A bit anxious. She is fairly alert.  She is not as negative as she has been today. She is able to redirect  and has fair insight.  MUSCULOSKELETAL:  She has multiple tender points once again throughout.  She has pain in the back and neck with range of motion today.  Her  weight is generally stable.  Trace swelling is appreciated in the limbs.  NEUROLOGICAL: Cognitively she is fair with some attentional issues but  no significant changes otherwise is seen.   ASSESSMENT:  1. Fibromyalgia syndrome.  2.  Insomnia.  3. Severe anxiety and depression.  4. Questionable asthma,.  5. SI joint disorder.  6. History of interstitial cystitis/irritable bowel syndrome.  7. Costochondritis.   PLAN:  1. Will increase Requip to 0.5 mg q.h.s. and add a 0. 25 mg dose in      the day.  2. I would like her to restart the Mestinon at 60 mg in the morning      and noon increasing potentially up to 90 mg depending on response.  3. Resume Tofranil.  4. Follow up with urology regarding her cystitis.  5. Recommended ice, rest, avoidance of exacerbating activities for her      chest.  6. Continue hydrocodone for break through pain (10/325).  7. I will see her back in 3 months time.      [name], M.D.  Electronically Signed    ZTS/MedQ  D:  07/10/2007 16:21:57  T:  07/12/2007 13:39:52  Job #:  [id number]

## 2011-02-05 NOTE — Assessment & Plan Note (Signed)
Karina Greer is back regarding her fibromyalgia.  She is seeing Dr. Leonides Cave for  mood and seems to be coming up with some constructive ideas for  behavioral modification.  She seems to have better awareness and reality  of her situation and her behavior.  She is focusing on positive  productive activities and thinking rather than the negative.  She rates  her pain overall 8/10.  She has issues still with sleeping which she  would like to improve.  Her pain during the day may be a bit better.  Her Oswestry score is 70% today.  Pain interferes with general activity,  relations with others, and enjoyment of life on a moderate-to-severe  level.  She is sleeping 3-4 hours at night.   REVIEW OF SYSTEMS:  Notable for urinary frequency of late.  She is  worried she has a UTI.  Also reports numbness, tremor, tingling,  confusion, and weight loss.  She lost about 15 pounds over the last 3  months.  She has occasional nausea still and problems with appetite.  The full review is in the written health and history section.   SOCIAL HISTORY:  The patient is divorced, living with her kids and  parents.   PHYSICAL EXAMINATION:  VITAL SIGNS:  Blood pressure is 123/83, pulse is  80, and respiratory rate 18.  She is sating 98% on room air.  GENERAL:  The patient is generally pleasant, a bit anxious and tearful  at times, but more appropriate than I have seen her for a while.  She  definitely was focusing on productive thoughts and activities rather  than focusing solely on her pain.  NEUROLOGIC:  She is stable with intact memory.  Cranial nerve exam,  motor function.  Sensory exam is stable.  Weight appeared to be slightly  decreased, although it is difficult to tell with her baggy clothing.  HEART:  Regular.  CHEST:  Clear.  ABDOMEN:  Soft and nontender.   ASSESSMENT:  1. Fibromyalgia syndrome.  2. Anxiety and depression.  The patient seems to have changed gears a      bit regarding her perceptions and  thinking which is a positive      sign.  3. Insomnia.  4. Interstitial cystitis and questionable urinary tract infection.  5. Sacroiliac joint disorder.  6. Irritable bowel syndrome.  7. Costochondritis.   PLAN:  1. Continue counseling with Dr. Leonides Cave and activity in psychosocial      changes which have been discussed at length in the past.      Hopefully, she is moving forward here.  2. We will check UA/CNS.  3. Follow up with Dr. [name] for ongoing urological needs.  4. Increased ReQuip 2 mg nightly to see if we can improve sleep      further before we add another agent.  Continue Tofranil 150 mg at      night.  5. Norco 10/325 one every 6 hours p.r.n. for pain.  6. Celexa 60 mg daily for mood.  7. I will see her back in 3 months.      [name], M.D.  Electronically Signed     ZTS/MedQ  D:  09/13/2008 09:50:22  T:  09/14/2008 03:25:51  Job #:  [id number]   cc:   [name], M.D.  Fax: [phone]

## 2011-02-05 NOTE — Assessment & Plan Note (Signed)
Karina Greer is back regarding her fibromyalgia.  She has actually been doing  a bit better recently.  Requip has helped her restless leg symptoms and  she feels that the Tofranil has helped her sleep a bit.  It does cause  some itching but she uses Zyrtec to help with that.  She is going for a  bladder dilatation with Dr. [name] tomorrow.  She has got a better  insight regarding her fibromyalgia and what in entails.  She has been  checking into fibromyalgia support groups, etc.  Sleep is 4-5 hours a  night.  She uses Norco for breakthrough pain.   REVIEW OF SYSTEMS:  Notable for the above as well as some depression,  anxiety, confusion, bladder control problems, abdominal pain, poor  appetite.  Abdominal pain has since resolved.  She does complain of some  pain on her medial elbows.   SOCIAL HISTORY:  Patient is divorced, living with her children.  Other  pertinent positives are above.   PHYSICAL EXAMINATION:  Blood pressure is 125/81.  Pulse is 90.  Respiratory rate 18.  She is satting 99% on room air.  Patient is  pleasant, a little less anxious than usual.  She is more directed in her  speech and thoughts.  She wonders less from topic to topic.  She had  some tenderness along the ulnar groove on both elbows.  Neither elbow  had a positive Tinel's sign today, however.  Other areas of tenderness  were noted as well.  Weight is generally stable.  Gait was intact.  Cognition was generally appropriate.  She does have some difficulties  with organization and memory still.  Cranial nerves exam is intact.   ASSESSMENT:  1. Fibromyalgia syndrome.  2. Insomnia.  3. Interstitial cystitis.  4. Anxiety, depression.  5. Sacroiliac joint disorder.  6. Irritable bowel syndrome.  7. Costochondritis.   PLAN:  1. Recommended ice, padding and extension of the elbows.  2. Patient will follow up with Dr. [name] tomorrow for the procedure.      Could consider other options including nerve block if she  fails      urological management.  3. Increased Tofranil 150 mg q. h.s. for mood, sleep and pain.      Maintain Requip at 1 mg at bedtime.  4. Refill Norco 10/325 #90.  5. Discusses exercise and more goal-oriented behavior.  I think she is      making some headway in her understanding and realization of her      problem and specific needs.  6. I will see her back in 3 month's time.      [name], M.D.  Electronically Signed     ZTS/MedQ  D:  12/29/2007 09:36:11  T:  12/29/2007 09:57:22  Job #:  [id number]   cc:   [name], M.D.  Fax: [phone]

## 2011-02-05 NOTE — Op Note (Signed)
Karina Greer, Karina Greer               ACCOUNT NO.:  [id number]   MEDICAL RECORD NO.:  [id number]          PATIENT TYPE:  AMB   LOCATION:  [facility]                         FACILITY:  [facility]   PHYSICIAN:  [name], M.D.  DATE OF BIRTH:  17-Jan-1966   DATE OF PROCEDURE:  12/30/2007  DATE OF DISCHARGE:                               OPERATIVE REPORT   PREOPERATIVE DIAGNOSIS:  Chronic pelvic pain with interstitial cystitis.   POSTOPERATIVE DIAGNOSIS:  Chronic pelvic pain with interstitial  cystitis.   PROCEDURE PERFORMED:  Cystoscopy with hydraulic overdistention of the  bladder and instillation of Clorpactin and Marcaine.   SURGEON:  [name], M.D.   ANESTHESIA:  General.   INDICATIONS:  Ms. Igo is a 45 year old female with longstanding  irritative voiding symptoms and pelvic discomfort.  In the past she  underwent a hydraulic overdistention of the bladder for presumed  interstitial cystitis with equivocal findings.  She did get better and  her symptoms did substantially improve.  The patient subsequently has  had fairly chronic pelvic discomfort with quite a bit of bladder  irritability and no other obvious etiology.  We went through several  options with her and she chose to have a repeat hydraulic overdistention  of her bladder.   TECHNIQUE AND FINDINGS:  The patient was brought to the operating room,  where she had successful induction of general anesthesia.  She was  placed in lithotomy position and prepped and draped in the usual manner.  Initial cystoscopy was unremarkable.  She underwent hydraulic  overdistention of her bladder to 100 cmH2O pressure for 5 minutes.  Capacity was reduced at 600 mL.  The patient had fairly diffuse 3-4+  glomerular hemorrhaging and endoscopic findings very consistent with  interstitial cystitis.  No ulcer noted.  A repeat hydraulic  overdistention was done for an additional 2-3 minutes and her bladder  was then emptied.  Clorpactin was  instilled under gravity drainage a few  hundred milliliters at a time utilizing a standard concentration.  That  was then drained from the bladder and 30 mL of Marcaine was instilled.  The patient appeared to tolerate the procedure well.  No obvious  complications or difficulties.  She was brought to recovery room in  stable condition.           ______________________________  [name], M.D.  Electronically Signed     DSG/MEDQ  D:  12/30/2007  T:  12/30/2007  Job:  [id number]

## 2011-02-05 NOTE — Assessment & Plan Note (Signed)
Karina Greer is back regarding her fibromyalgia.  She has been doing  fairly well as a whole related to her fibromyalgia.  She does note some  response to her periods, in regards to her fibromyalgia intensity.  She  rates her pain today at 8/10.  Her Oswestry score 64% today.  Pain is  sharp, stabbing, aching, and tingling.  Pain interferes with general  activity, relations with others, enjoyment of life on a severe level.  Pain remains poor at times.  She notes increased pain with activity and  on prolonged sitting.  She is still does exercise a bit, but does not  get the much the way of aerobic activity.   REVIEW OF SYSTEMS:  Notable for numbness, tremor, tingling, spasm,  dizziness, anxiety, confusion, depression, diarrhea, nausea, abdominal  pain, coughing, fever, chills, and shortness of breath.  She does report  ongoing load vitamin D level and she is taking essentially 1200 units a  week of vitamin D supplement.   SOCIAL HISTORY:  The patient is divorced.  Living with the children.  No  specific changes noted there.   PHYSICAL EXAMINATION:  VITAL SIGNS:  Blood pressure is 137/69, pulse is  71, respiratory rate 18, and she is saturating 97% on room air.  GENERAL:  The patient is pleasant, generally alert and appropriate.  I  thought she was in a better mood today and less anxious.  Cough was  minimal.  She has a fair range of motion activity tolerance today.  She  is able to walk with normal gait essentially.  Continues to wear loose,  bulky fitting clothing.  HEART:  Regular.  CHEST:  Clear.  ABDOMEN:  Soft and nontender.   ASSESSMENT:  1. Fibromyalgia syndrome.  2. Anxiety with depression.  3. Insomnia.  4. Questionable relation of periods to fibromyalgia intensity.  5. Interstitial cystitis.  6. Sacroiliac joint disorder.  7. Irritable bowel syndrome  8. Chronic costochondritis.  9. Vitamin D deficiency.   PLAN:  1. We will begin the patient on Calciferol 50,000  units every week x6      weeks.  We will check vitamin D series in approximately 4-6 weeks      time.  2. Begin the patient on a trial of Ortho Evra patch 20/150 mcg 1 patch      2 days prior to period for 1 week duration.  3. Refill hydrocodone 10/325, #90.  4. Discussed again exercise and importance of aerobic activity.  5. We can reduce ReQuip down to 1 mg at night for sleep as she noted      the ReQuip made her feel drunk in the morning.  Continue Tofranil.  6. I will see her back with me in about 3 months' time.     [name], M.D.  Electronically Signed    ZTS/MedQ  D:  12/07/2008 10:09:55  T:  12/07/2008 09:81:19  Job #:  [id number]

## 2011-02-05 NOTE — Assessment & Plan Note (Signed)
Karina Greer is back regarding her fibromyalgia.  She has been working on  improving her interactions and activities of a whole.  The vitamin D  seems to have helped.  I gave her instructions on the Ortho Evra patch  and it seems to help somewhat with her fibromyalgia intensity.  Apparently, she was given some different instructions and was told to  take it weekly which is not what I intended.  She has questions  regarding the scheduling of this drug today.  Today, she rates her pain  a at 4/10, but it can range to 8/10.  She describes the pain as sharp,  intermittent, tingling, stabbing.  Pain is in shoulders, back, legs.  She has some tingling and pain in the anterolateral thighs at times.  Sleep is fair.  She can walk 30 minutes at a time on a good day.   REVIEW OF SYSTEMS:  The patient reports bladder control problems,  numbness, dizziness, depression, anxiety, abdominal pain, problems with  appetite, limb swelling, nausea.  Other pertinent positives are above  and full review is in the written health and history section of the  chart.   SOCIAL HISTORY:  The patient is divorced, lives with her children and  family.   PHYSICAL EXAMINATION:  VITAL SIGNS:  Blood pressure 128/90, pulse is 90,  respiratory rate 18, and she is sating 99% on room air.  GENERAL:  The patient is pleasant, alert, and oriented x3.  Affect is  bright and appropriate.  She has not lost any significant weight.  She  has fair range of motion with flexion/extension in low back.  Her  strength is 5/5 including normal sensation overall.  She is wearing  shorts today.  HEART:  Regular.  CHEST:  Clear.  ABDOMEN:  Soft, nontender.  NEURO:  I felt that she is a bit clear as a whole and more direct with  her thought processing overall.   ASSESSMENT:  1. Fibromyalgia syndrome.  2. Anxiety with depression.  3. Insomnia.  4. Menstrual period fluctuation and fibromyalgia intensity.  5. Interstitial cystitis.  6.  Sacroiliac joint disorder.  7. Irritable bowel syndrome.  8. Chronic costochondritis.  9. Vitamin D deficiency.   PLAN:  1. The patient had her vitamin D level checked by her family      physician.  She can back off to the Calciferol to every other week      schedule.  We will check vitamin D levels in the fall.  2. Rewrote Ortho Evra patch 20/150 mcg 1 patch 2 days prior to period.      Patch is to stay on for 1 week at a time and then stop.  3. Refill hydrocodone 10/325, #90.  4. Continue pushing exercise and activity, as well as resocialization.  5. Other medicines were unchanged.  6. I will see her back in about 3 months' time.      [name], M.D.  Electronically Signed     ZTS/MedQ  D:  03/21/2009 10:33:19  T:  03/22/2009 01:42:59  Job #:  [id number]   cc:   Lonie Peak, PA

## 2011-02-05 NOTE — Assessment & Plan Note (Signed)
Karina Greer is back regarding her chronic pain.  She was unable to tolerate  the Mestinon.  She has multiple complaints about knee swelling, chest  pain, etc.  She still gets short-of-breath with minimal activity  associated with her chest tightness and pain. She did not tolerate the  Mestinon due to nausea.  She still thinks the Requip is beneficial.  She  is only on 0.5 mg at night and 0.25 mg in the morning.  She rates her  pain a 9-10/10 and describes it as stabbing and constant.  The pain  interferes with her general activity rates its interference with  enjoyment of life on a 10/10 level.  Sleep is poor.   REVIEW OF SYSTEMS:  Notable for the above as well as multiple positive  items as listed in the health history section of the chart.  The patient  does some housework such as helping with laundry and a few home chores,  but rarely does anything else.  She relies on her sons to help her with  these items.   SOCIAL HISTORY:  The patient is divorced and living with her children,  who are 61 and 8 years of age.   PHYSICAL EXAMINATION:  VITAL SIGNS:  Blood pressure 154/82.  Pulse is  94.  Respiratory rate 18.  She is saturated 100% on room air.  GENERAL:  The patient remains alert but is anxious and at times flat.  She is a bit more animated than she has been at other times but remains  really negative in the image of herself as well as her overall progress  and medical state.  She has multiple tender areas throughout.  I found  no specific areas of swelling, redness or breakdown.  The area around  her knee, which she complains of tenderness, was not specifically tender  or swollen in any way.  She continues to have problems with attention  and anxiety, which make conversation difficult at times.   ASSESSMENT:  1. Fibromyalgia syndrome.  2. Insomnia.  3. Severe anxiety with depression.  4. Questionable asthma.  5. SI joint disorder.  6. History of interstitial cystitis and irritable  bowel symptoms.  7,.  History of costochondritis.   PLAN:  1. We will try increasing Requip to 1 mg h.s. and 0.5 mg in the      morning.  2. We talked again about the patient's attitude and outlook.  She      continues to sabotage any chance she has for improvement in her      pain and overall mobility.  She is going to need to work through      some of these problems if she ever wants to have a chance to move      forward.  Dr. Leonides Cave stopped seeing her because essentially she was      sabotaging the sessions with her negativity and planned failure.  3. Follow up with Dr. Early Osmond regarding cystitis.  4. Continue ice and rest p.r.n. to tender areas on the sacrum and      chest.  5. Hydrocodone for breakthrough pain.  6. Resume Tofranil 75 mg q.h.s.  7. I will see her back in three months with nurse clinic follow up in      one month's time.      [name], M.D.  Electronically Signed    ZTS/MedQ  D:  10/02/2007 16:45:59  T:  10/02/2007 21:46:09  Job #:  [id number]

## 2011-02-08 NOTE — Assessment & Plan Note (Signed)
MEDICAL RECORD NUMBER:  [id number]   Karina Greer is back regarding her fibromyalgia. No new changes have been noted  since last visit from a pain standpoint. She did find the hip injection  helpful. She has had a little better sleep. She did not get her Norco  refilled so has not been using that. She reports her pain is an 8/10. She  follows up with Dr. Leonides Cave for behavioral issues. Her Celexa was increased  to 30 mg by Dr. Anna Genre. The patient reports increased nausea over the last  week or so. She is exercising daily but only for a few minutes at a time.  She is not doing anything scheduled or regimented. She is doing a little  stretching. The patient describes her pain as sharp, stabbing, aching, and  it interferes with general activity, relations with others, and enjoyment of  life on a severe level. Pain is a problem throughout the day. It seems to be  worse with exercise and activity. She can walk about 10 minutes without  stopping before pain sets in. She does report some blood in her stool. She  was placed on Bentyl by Dr. Anna Genre recently. Bladder is improved after she  passes what she thinks was a stone.   REVIEW OF SYSTEMS:  The patient reports anxiety and depression, spasms,  diarrhea, abdominal pain, some nausea as mentioned above. Other review of  systems items are negative.   SOCIAL HISTORY:  The patient lives with her husband and family without  significant change.   PHYSICAL EXAMINATION:  Blood pressure is a 131/89, pulse is 88, respiratory  rate is 16, she is saturating 99% in room air. The patient is in no acute  distress. She remains very anxious and is negative at times. Her weight  appears unchanged. She is alert and oriented x3. Gait stable. Her sensory  exam is without change. Reflexes are 2+. Coordination is good. The patient  has multiple tender points and tender areas throughout the body today. She  remains tight in the low back and leg musculature. Heart is  respiratory  rate, lungs are clear.   ASSESSMENT:  1.  Fibromyalgia.  2.  Low back pain and right hip pain that has been consistent with right      greater trochanter bursitis but is also related with tight hip      musculature.  3.  Insomnia.  4.  Anxiety/depression.  5.  Questionable sacroiliac joint syndrome.   PLAN:  1.  Will place Flexeril at bedtime 20-30 mg q.h.s.  2.  Continue Celexa 30 mg q.h.s. per Dr. Anna Genre.  3.  Encourage further Dr. Leonides Cave follow-up. I spoke with Dr. Leonides Cave      recently. He felt that he was making some headway with her emotional      levels.  4.  Continue magnesium.  5.  I refilled her Norco 10/325 one q.4-6h. p.r.n. #90.  6.  Encouraged increased exercise and stretching on regimented daily basis.  7.  If nausea does not improve, she needs to follow up with her      gastroenterologist.  8.  I will see the patient back in 2 months' time.     [name], M.D.  Electronically Signed    ZTS/MedQ  D:  05/20/2005 10:07:35  T:  05/20/2005 11:12:46  Job #:  [id number]

## 2011-02-08 NOTE — Assessment & Plan Note (Signed)
Karina Greer is back regarding her fibromyalgia and pain syndrome.  We had an MRI  performed of the brain after last visit which was really unremarkable.  There are still multiple areas of abnormal signal within the supratentorial  brain, parenchyma.  One small lesion was slightly increased over the prior  in the left external capsule.  One area was less apparent, and overall there  was a little significant change.  There may have been one small new lesion  in the right parietal lobe.  I spoke with Dr. Sandria Manly about the findings, and  he was not impressed by my account considering the fact that similar changes  were seen 2 years ago on a prior MRI scan.  He did not see the patient for  the films in person, however.  The patient has made good steps in reducing  her medications and is rarely using her Ativan currently.  She has gotten  off Flexeril as well.  She is only using the Norco for breakthrough pain 2  to 4 times a day.  Celexa is down to 40 mg a day.  She does complain of some  dizzy spells. She complains that her head feels fuzzy.  She is not able to  totally correlate them with an activity or certain preceding problem.  She  complains of tingling and numbness in the hands.  The pain is overall 9/10  still.  She states that it interferes with general activity, relationship  with others, and enjoyment of life on a 10/10 level.  Sleep is poor.   REVIEW OF SYSTEMS:  The patient lists multiple problems including trouble  walking, dizziness, confusion, anxiety, night sweats, nausea, vomiting,  diarrhea, decreased appetite, respiratory infections, etc.  Full review is  in the Health and History section of the chart.   SOCIAL HISTORY:  The patient lives with family and children currently. She  does not drive currently.   PHYSICAL EXAMINATION:  VITAL SIGNS: Blood pressure 151/81, pulse 84,  respiratory rate 17.  She is saturating 100% on room air.  GENERAL:  The patient is pleasant, in no acute  distress.  She appears much  more alert than she has on prior visits.  She has good awareness and  reasoning today.  Balance is good with normal gait.  The patient had some  loss of balance on heel-to-toe ambulation but corrected nicely.  Strength is  5/5.  Sensation was grossly intact, although she states there was some  decreased pinprick distally.  Reflexes are 1+ to 2+.  HEART: Regular without palpitations or irregularity noted.  CHEST: Clear.  ABDOMEN:  Soft, nontender.  NEUROLOGIC: Cranial nerve exam was nonfocal.  No vestibular signs were seen.   ASSESSMENT:  1.  Fibromyalgia-like syndrome.  2.  Low back and head pain.  3.  Insomnia.  4.  Anxiety and depression.  5.  History of questionable sacroiliac joint dysfunction.  6.  Gait disorder.  7.  Mental status changes.   PLAN:  1.  The patient seems much more alert to me with decreased Ativan and      Flexeril as well as Phenergan.  Continue Norco for breakthrough pain      10/325.  2.  Continue Celexa at 40 mg daily for mood.  3.  I see no acute evidence compared to her last exam and MRI of any new      neurological process.  If this was progressive MS,  I would expect to  see more on the scan.  I am a bit concerned over the lightheadedness as      it relates to potential cardiac issue.  I think it is worthwhile to have      her examined by a cardiologist to rule out any arrhythmia or other      cardiogenic source of her presyncopal episode, shortness of breath, and      questionable palpitations.  I will make a referral to [facility]      for this.  4.  Encourage exercise 5 days a week to tolerance.  5.  Add back DHEA 12.5 mg daily.  6.  Recommend B vitamin.  Consider nerve conduction study of the extremities      as well.  7.  Magnesium supplementation.  8.  Continue psychological counseling.  9.  I will see the patient back in about 2 months' time.      [name], M.D.  Electronically  Signed     ZTS/MedQ  D:  01/06/2006 12:25:08  T:  01/06/2006 21:04:22  Job #:  [id number]   cc:   Lonie Peak

## 2011-02-08 NOTE — Assessment & Plan Note (Signed)
Karina Greer is back regarding her fibromyalgia.  She has been doing fairly  well over the last few months.  The Ortho Evra patches work for her  during her periods for the most part except when she missed time.  Pain  ranges from 6-9/10.  Pain is sharp and tingling.  She states that she  wants to lose weight.  She is walking 2-3 times a week for about a half  mile.  She states that after she walks as the following day she has more  pain in her back, but then this resolves.  Sleep is fair.   REVIEW OF SYSTEMS:  Notable for some allergies despite her medications  that she is taking.  She reports weakness, tingling, anxiety,  depression, night sweats, abdominal pain, poor appetite, and coughing.  Full 14-point review is in the written health history section of the  chart.   SOCIAL HISTORY:  As noted above.  She lives with her children and family  still.   PHYSICAL EXAMINATION:  VITAL SIGNS:  Blood pressure 149/98, pulse 94,  respiratory rate 16.  She is sating 97% on room air.  GENERAL:  The patient is pleasant, alert, and oriented x3.  Affect is a  bit brighter than usual.  Her weight is unchanged that I can see.  MUSCULOSKELETAL:  She has fair range of motion still in her neck and low  back with some tenderness or with flexion and extension, but pain  throughout nonetheless.  Strength is 5/5 with 2+ reflexes and sensation.  HEART:  Regular.  CHEST:  Clear.  ABDOMEN:  Soft and nontender.   ASSESSMENT:  1. Fibromyalgia syndrome.  2. Anxiety with depression.  3. Insomnia.  4. Menstrual period fluctuation of fibromyalgia intensity.  5. Interstitial cystitis.  6. Sacroiliac joint disorder.  7. Irritable bowel syndrome.  8. Costochondritis, chronic.  9. Vitamin D deficiency.   PLAN:  1. Vitamin D levels per family physician.  2. Allergy management per family doctor.  I recommended resuming her      Flonase, however.  3. Should continue with the Ortho Evra patches for now as written.  4. Refill hydrocodone 10/325, #90.  5. Encourage again exercise activity and more appropriate diet.  She      is on essentially starvation-type diet, it does not lose weight.  6. I will send the patient to [facility] for      counseling.      [name], M.D.  Electronically Signed    ZTS/MedQ  D:  06/09/2009 11:43:10  T:  06/10/2009 03:48:06  Job #:  [id number]

## 2011-02-08 NOTE — Assessment & Plan Note (Signed)
MEDICAL RECORD NUMBER:  [id number]   Karina Greer is back regarding her fibromyalgia.  She has been doing a bit better  with the Bentyl for spasms at 20 mg q.i.d.  Pulmicort seems to help some of  her breathing issues as well, though she still has some anxiety that  interferes.  She does not know if the Xanax 0.25 q.12h. is completely  helping her anxiety.  It may be a bit better.  She had a fall on the steps  about 3 weeks ago where she slid and fell right on her sacral area.  She is  having right buttock and sacral pain.  She rates her pain as a 10/10.  She  describes the pain as sharp, stabbing, constant.  Pain interferes with  general activity, relations with others, and enjoyment of life on a severe  level.  Sleep is poor.  She is not consistently using her Flexeril for  spasms or sleep.   REVIEW OF SYSTEMS:  The patient reports occasional tremor, anxiety,  depression, easy bleeding, occasional nausea, diarrhea.  She still has some  intermittent shortness of breath,  poor appetite, and abdominal pain.  Full  review is in the health and history section of the chart.   SOCIAL HISTORY:  The patient is divorced and living with her children  currently.   PHYSICAL EXAMINATION:  Blood pressure is 134/78, pulse is 84, respiratory  rate 18, she is saturating 97% in room air.  The patient is pleasant, no  acute distress.  She is alert and oriented x3.  Affect is bright and  appropriate, gait is stable.  Heart is regular rate and rhythm, lungs are  clear, abdomen soft and nontender.  Coordination actually is stable to  improved.  Reflexes are 2+.  Sensation is normal.  Motor function is 5/5.  She had some pain along the lower third of the sacrum somewhat along the  upper gluteus maximus muscle.  There was point tenderness to palpation on  the sacrum today.  Flexion, extension, rotation, lateral bending were  essentially consistent with prior examinations.  Cognitively, she was  intact.  Her  anxiety seemed to be improved and she had better attention and  focus overall today.  She had written her cues for questioning on the palm  of her left hand today.   ASSESSMENT:  1. Fibromyalgia syndrome.  2. Low back pain which has been consistently musculoskeletal nature.  3. Recent fall with sacral contusion.  4. Insomnia.  5. Anxiety with depression.  6. Shortness of breath, questionable asthma.  7. History of sacroiliac joint dysfunction.   PLAN:  1. Continue low-dose Xanax 0.25 mg q.12h.  I do not really want to      increase this any further, considering her history of falls.  She needs      to see a psychiatrist for regular management.  She has failed other      medications and refused other medications in the past.  2. Maintain Norco 10/325 one q.6h. p.r.n. for pain.  3. Increase Pulmicort to 180 mcg one puff b.i.d.  4. Bentyl 20 mg q.i.d. for abdominal/GI spasms.  5. Advised ice to the sacral region.  Will give her a 14-day trial of      Celebrex 200 mg daily.  6. Phenergan 12.5 daily p.r.n. for nausea (severe).  7. Flexeril 10 mg t.i.d. p.r.n. for spasm.  8. I will see the patient back in about 2 months' time.  [name], M.D.  Electronically Signed     ZTS/MedQ  D:  06/30/2006 13:01:52  T:  07/01/2006 15:54:10  Job #:  [id number]   cc:   Lonie Peak, PA

## 2011-02-08 NOTE — Op Note (Signed)
Karina Greer, Karina Greer               ACCOUNT NO.:  [id number]   MEDICAL RECORD NO.:  [id number]          PATIENT TYPE:  AMB   LOCATION:  [facility]                         FACILITY:  [facility]   PHYSICIAN:  [name], M.D.  DATE OF BIRTH:  [birth year]   DATE OF PROCEDURE:  10/27/2006  DATE OF DISCHARGE:                               OPERATIVE REPORT   PREOPERATIVE DIAGNOSES:  1. Pelvic pain.  2. His urinary frequency.  3. 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   POSTOPERATIVE DIAGNOSES:  1. Pelvic pain.  2. His urinary frequency.  3. 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   PROCEDURE PERFORMED:  Cystoscopy, urethral dilation, hydraulic  overdistention of the bladder and installation of Marcaine.   SURGEON:  [name], M.D.   ANESTHESIA:  General.   INDICATIONS:  [name] is a 45 year old female.  She has had some  longstanding bladder issues.  She is complaining of chronic bladder  pressure and constant urge to void.  She is having nocturia three to  four times per evening and having substantial frequency.  She also has a  history of fibromyalgia.  Catheter urine was crystal clear and given her  longstanding symptoms of constant bladder pressure with significant  irritative voiding symptoms, we feel that she probably has some  component of painful bladder syndrome or interstitial cystitis.  She  presents now for further assessment.   TECHNIQUE AND FINDINGS:  The patient was brought to the operating room  where she had successful induction of general anesthesia.  She was  placed in lithotomy position and prepped, draped usual manner.  Urethral  dilation was performed to 26-French she did have mild urethral snugness.  Initial cystoscopy revealed unremarkable bladder.  She underwent  hydraulic over distension to 1 meter water pressure.  Capacity was only  500 mL.  She had some very minimal diffuse glomerular hemorrhaging I  would deem 1+.  Her hydraulic over distension was done for 5  minutes.  At the end of the procedure we instilled some Marcaine in her bladder.  She appeared to tolerate the procedure well.  There were no obvious  complications or problems.           ______________________________  [name], M.D.  Electronically Signed    DSG/MEDQ  D:  10/27/2006  T:  10/27/2006  Job:  [id number]

## 2011-02-08 NOTE — Assessment & Plan Note (Signed)
HISTORY OF PRESENT ILLNESS:  Karina Greer is back regarding her fibromyalgia and  pain syndrome.  Since I last saw her, she reports several falls just over  the last two weeks.  She even notes that her balance has been worse over the  last two months in general.  We sent her for a Lyme titer last visit, and  this was unremarkable, and within reference range.  She notes problems with  sleep.  Pain is an ongoing issue.  The pain has still been an issue in a  diffuse fashion.  She also complains of ongoing decreased mood/ energy and  blurred vision.  I asked Kelicia about her Celexa, and she states that it was  increased a few months ago and may coincide with symptoms.  She has  minimized her Ativan use and is only using this 1-2 times a week for  anxiety.  The Flexeril is used sometimes at night for sleep.  She is using  Norco 10/325 4-6 times a day for pain as needed.  She is also using  Phenergan for nausea associated with Vicodin and other medications 2-3 times  a day at the 25 mg dose.   I asked Amantha if she had seen Neurology in the past, and she stated that  she had seen Dr. Sandria Manly for headaches in the remote past.  It does not sound  as if he evaluated her for any cognitive deficits or gait disorder.   REVIEW OF SYSTEMS:  The patient reports bowel and bladder control problems,  depression, anxiety, decreased vision, weight gain, vomiting, nausea,  abdominal pain, swelling.   SOCIAL HISTORY:  The patient is married and lives with her children.  She is  no longer driving due to her decreased coordination.   PHYSICAL EXAMINATION:  VITAL SIGNS:  Blood pressure 132/80, pulse 110,  respiratory rate 16, saturating 98% on room air.  NEUROLOGICAL:  The patient's mood is fair.  She is very forgetful and  confused and lacks attention.  Gait is slightly shuffling.  We tried heel-to-  toe ambulation, and she had some difficulty with balance overall.  She had  some lateral nystagmus with end gaze to  the right or left.  Cranial nerve  exam is unremarkable, otherwise.  Sensation was normal.  Straight-leg  testing was negative to equivocal.  She has had some tightness in the  hamstrings and low back musculature.  Hand-eye coordination was fair, but  slow.  She did have some problems with following me on hand/eye coordination  and notes some decreased balance and vision when she went back and forth  from finger to nose.  HEART:  Regular rate and rhythm.  LUNGS:  Clear.  ABDOMEN:  Soft, nontender.   ASSESSMENT:  1.  Fibromyalgia-like syndrome.  2.  Low back and hip pain.  3.  Insomnia.  4.  Anxiety/depression.  5.  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  6.  Gait disorder and mental status changes.   PLAN:  1.  Would like to start by decreasing potentially sedating medications.  We      will reduce Phenergan to 12.5 mg daily p.r.n. for nausea.  2.  I will send for an MRI of the head to rule out any acute or new process      that would be affecting her balance.  3.  The patient needs to continue with her psychological/psychiatric follow      up.  We will decrease Celexa to 40 mg q.h.s. to see  if this provides any      improvement.  I refilled Norco 10/325 which she has been on for some      time.  4.  Add Rozerem 8 mg q.h.s. for sleep.  5.  Will follow up with the patient in about 4-6 weeks time.  I am still not      clear as to what we are dealing with at this point.  May consider      neurological follow up in the future if we have no success with the      current plan.      [name], M.D.  Electronically Signed     ZTS/MedQ  D:  11/01/2005 11:13:44  T:  11/01/2005 13:12:09  Job #:  [id number]

## 2011-02-08 NOTE — Assessment & Plan Note (Signed)
MEDICAL RECORD NUMBER:  [id number]   Karina Greer is back regarding her fibromyalgia.  She stopped the Klonopin due to  sedation.  Still reports some anxiety that seems to hold back her activities  and she is very self-conscious, particularly when she is out in public.  She  has not seen Dr. Leonides Cave lately for psychological support.  She is working on  her weight and has lost 12 pounds.  She is trying to exercise a bit.  She  states that she stopped eating, however.  She is taking her vitamin  supplementations which seem to be helping.  She complains of some right low  back pain that seems t worsen with activity.  She also had multiple tender  points along the extremities and chest.   Average pain ranges from a 7-9/10.  She describes it as constant. The pain  interferes with general activity, relations with others, and enjoyment of  life on a moderate level.  Sleep is fair.  The patient continues to take  Norco 10/325 for breakthrough pain.  She found the Lidoderm patches  difficult to keep on her skin.   REVIEW OF SYSTEMS:  The patient reports numbness, occasional tremor,  anxiety, weight loss, poor appetite, abdominal spasms, and diarrhea and  nausea, coughing occasionally with activity, and shortness of breath with  activity.  She has been cleared from a cardiology standpoint for the  shortness of breath.   SOCIAL HISTORY:  The patient is getting divorced and living with children.   PHYSICAL EXAMINATION:  Blood pressure is 131/79, pulse is 72, respiratory  rate 16, she is saturating 100% on room air.  The patient's weight was 193  pounds today.  The patient is generally pleasant, more alert, and  appropriate.  She was a bit anxious.  Gait was stable.  She had pain along  the low back with flexion and there was notable spasm in the lumbar  paraspinals.  Facets were stable.  Motor function is 5/5.  Sensation was  normal.  Heart was regular rate and rhythm, lungs were clear, abdomen soft  and nontender.  I thought attention and focus was improved today.  She did  have to try hard sometimes to maintain her focus in certain situations.  Cranial nerve exam was normal.   ASSESSMENT:  1. Fibromyalgia-like syndrome.  2. Low back and head pain which is musculoskeletal in nature.  3. Insomnia and anxiety with depression.  4. History of sacroiliac joint dysfunction.   PLAN:  1. Will try Xanax low-dose 0.25 q.12h., perhaps increasing to q.8h. for      mood control.  2. Continue exercise and social reintegration.  3. Refill Norco 10/325 #90.  4. Will increase the patient's Bentyl for abdominal spasms to 20 mg q.i.d.  5. I think a lot of shortness of breath with exercise is anxiety-related      but will give her a trial of Pulmicort 90 mcg inhaler one puff b.i.d.      to see if there is any type of asthmatic component to this shortness of      breath.  She has used an inhaler in the past but not recently.  She      states that these symptoms are somewhat similar to old symptoms she had      while she was younger.  6. Encourage followup with Dr. Leonides Cave for counseling.  7. Will see the patient back in about 2months' time.      Lamonte Richer.  Riley Kill, M.D.  Electronically Signed     ZTS/MedQ  D:  05/02/2006 12:34:58  T:  05/02/2006 14:13:15  Job #:  [id number]   cc:   Lonie Peak

## 2011-02-08 NOTE — Assessment & Plan Note (Signed)
Karina Greer is back regarding her fibromyalgia syndrome.  [name] has worked to  decrease some of her medications, including her Phenergan, which she has  come off of completely.  She is rarely using Flexeril and Ativan as well.  She is trying to improve her diet, which we have spoken about in the past.  She is taking vitamin B complex as well as magnesium supplementation.  She  tried the DHEA, but she stated that it makes her feel wicked.  She does  complain of severe knee pain today.  She also has pain in the low back and  neck.  She reports that her sleep is poor as she becomes very anxious at  bedtime and starts thinking about pain and other problems.  She reports a  fibro fog per se with occasional cloudiness and fuzziness in the head.   The patient has seen Dr. Diona Browner regarding her palpitations and cardiac  issues with workup still under way.  Apparently she is seeing him today, in  fact.   REVIEW OF SYSTEMS:  The patient reports anxiety, spasm, dizziness, weight  loss, fever, chills, skin rash, decreased appetite, diarrhea, occasional  nausea.   SOCIAL HISTORY:  The patient lives with her husband and children currently.   PHYSICAL EXAMINATION:  VITAL SIGNS:  Blood pressure is 133/84, pulse of 94,  respiratory rate 16.  She is saturating 98% in room air.  GENERAL:  The patient is pleasant.  She does appear to have lost some  weight.  She appears a bit more alert than in past visits.  MUSCULOSKELETAL/NEUROLOGIC:  Balance is stable.  She had some pain upon  palpation around the knee but no instability or crepitus was appreciated.  Strength was 5/5.  She had a stable gait, though she was a bit shuffling.  Multiple tender areas were palpated along the back, shoulder and neck areas.  No cranial nerve findings were noted today.  The patient remains anxious at  times.  She tends to lose focus occasionally as well.  CARDIAC:  Regular rate and rhythm without palpitations.  CHEST:  Clear.  ABDOMEN:  Soft, nontender.   ASSESSMENT:  1.  Fibromyalgia-like syndrome.  2.  Low back and head pain.  3.  Insomnia and anxiety/depression.  4.  History of sacroiliac joint dysfunction.  5.  Gait disorder.   PLAN:  1.  Try Klonopin 1 mg at bedtime to improve sleep.  A lot of her sleep      problems seem to be anxiety-mediated.  2.  Will refill Norco 10/325 mg one q.6h. p.r.n., #90 for pain.  3.  Will try Lidoderm patches to the knees.  We discussed multiple      modalities including heat and massage, desensitization exercises, etc.  4.  Continue vitamin supplementation and improved appetite.  I want to see      her push her exercise.  5.  We are a bit limited in medications due to her multiple intolerances.  6.  I still think she could benefit from psychological follow-up.  7.  Await results of cardiology workup.  8.  I will see the patient back in about 2 months' time.      [name], M.D.  Electronically Signed     ZTS/MedQ  D:  03/07/2006 12:54:02  T:  03/07/2006 13:41:26  Job #:  [id number]   cc:   Lonie Peak, M.D.

## 2011-02-08 NOTE — Assessment & Plan Note (Signed)
MEDICAL RECORD NUMBER:  [id number].   Karina Greer is here in followup of her diffuse pain and likely fibromyalgia. She  had intolerances of the trazodone at the last visit which caused headache,  so she has stopped this medication. She states that the hip injections that  we performed actually increased her pain, but now she is back to baseline.  Her pain levels range from 7 to 9/10. Generally, she is closer to 7. Anxiety  has been a bit better controlled with the Ativan. Chest x-rays were negative  for fracture or effusion. The Flexeril has been helpful for sleep, but she  is only using 5 mg. The patient had her laboratory tests done that we  ordered except that the DHEA and estrogen levels were not checked. We had  the nurse followup to see if we have the complete list lab testing in our  chart. Her magnesium was borderline low. Her progesterone was on the low  side as well. Otherwise, things appeared fairly unremarkable as related to  her metabolic panel, CBC, and thyroid function testing.   SOCIAL HISTORY:  She has had some family revelations take place this last  visit which have added stress but otherwise nothing new.   REVIEW OF SYSTEMS:  On review of systems, the patient reports nausea,  abdominal pain, problems with appetite, kidney pain in the left side,  tingling, anxiety, and depression.   PHYSICAL EXAMINATION:  Blood pressure 134/73, pulse 96, respiratory rate 16.  She is saturating 97% on room air. The patient is generally anxious but a  bit more controlled than usual. Heart is regular rate and rhythm. She is  alert and oriented x3. She is generally appropriate. Gait was stable. Motor  and sensory exam were 5/5. Deep tendon reflexes were 2+. Coordination was  fair. The patient had tenderness on multiple joints in muscular areas today  including the hips and ribs particularly. PSIS areas were painful. Cervical  range of motion remained fair. She had good shoulder movement.  Neurologically, her exam was stable.   ASSESSMENT:  1.  Likely fibromyalgia.  2.  Significant anxiety/depression.  3.  Insomnia.  4.  Bilateral hip pain, possibly consistent with greater trochanter      bursitis.   PLAN:  1.  Will increase Flexeril for better sleep habits. Encouraged her to try to      10 to 20 mg q.h.s.  2.  Regarding her mood, we will start Celexa which she has tolerated in the      past at 10 mg q.d. Also will send her to see Dr. Eula Flax for      stress, mood, and pain counseling.  3.  Need to followup on significant lab values. Would consider DHEA      supplementation pending her results, as her hormones that we did collect      seem to be the low side.  4.  Will continue off the trazodone.  5.  We would like for the patient to be in more aggressive aerobic exercise.      She may need physical therapy but is reluctant due to possible copays. I      did not push this issue with her today.  6.  I will see her back in about six weeks' time.     ZTS/MedQ  D:  12/14/2004 13:02:27  T:  12/15/2004 11:00:10  Job #:  [id number]

## 2011-02-08 NOTE — Assessment & Plan Note (Signed)
Karina Greer is back regarding her fibromyalgia. She states that she continues  to have problems with abdominal/hypogastric spasms. She has determined  that it is actually her bladder causing the symptoms. Her PCP tried her  on Detrol and Enablex without results thus far. Apparently, she has seen  a urologist in the past remotely. She felt that Celebrex was somewhat  helpful for pain. She feels that she may be a bit more depressed than  what she was. She has tried to set up follow up with Dr. Leonides Cave and has  psychiatric followup pending although she has had difficulties  communicating with the practice, apparently. The patient rates her pain  as 7 out of 10. Describes it as aching, tingling, dull, burning, and  stabbing. Pain is notable in the neck, chest, arms, shoulders, back,  legs, knees, and feet. The pain increases with walking, bending,  sitting, and most general activities. Sleep is poor. Pain is persistent  throughout the day but may be a little bit worse in the morning hours.  Pain interferes with general activity, relationships with others, and  enjoyment of life on a moderate level.   The patient continues low-dose Xanax 0.25 mg q.12 hours. She has  decreased her Phenergan use and only uses it rarely for nausea symptoms.  She remains on Pulmicort b.i.d. for asthma symptoms.  She uses Norco  10 for breakthrough pain.   REVIEW OF SYMPTOMS:  Positive for numbness and some tremor and tingling  in the hands, trouble walking, spasms, dizziness, anxiety, bladder spasm  issues, abdominal pain, poor appetite, coughing, and shortness of  breath.   SOCIAL HISTORY:  The patient is soon to be divorced. She is living with  her children and parents.   PHYSICAL EXAMINATION:  Blood pressure is 129/81, pulse 105, respiratory  rate 16, she is satting  at 98% in  room air. The patient is pleasant,  in no acute distress. She is alert and oriented times 3. Affect is  bright and appropriate.  Gait  is stable.  Coordination was fair. The  patient did have a mild intentional tremor and  I am not sure how  physiological it really was.  Reflexes are 2+.  Sensation is normal.  HEART:  Tachycardic.  CHEST: Clear.  ABDOMEN: Soft, nontender.  The patient has some generalized tenderness throughout the axial  skeleton, shoulders, hips, and extremities.  Cognitively, she had fair insight and awareness.   ASSESSMENT:  1. Fibromyalgia syndrome.  2. Low back pain, which ebbs and flows.  3. Insomnia.  4. Anxiety with depression.  5. Questionable asthma.  6. History of sacroiliac joint disfunction.  7. History of bladder spasms, questionable interstitial cystitis.   PLAN:  1. Begin trial of Sanctura 20 mg b.i.d. for spasms.  2. Begin trial of Lyrica 25 mg at low dose beginning h.s. and then      increasing up to t.i.d. over one weeks time.  3. Refill Norco 10/325 for breakthrough pain 1 q.6 hours p.r.n.,      number 90.  4. Phenergan for breakthrough nausea.  5. Celebrex 10 mg daily. The patient was given samples today.  6. I will see the patient back in about 2 months time.      [name], M.D.  Electronically Signed     ZTS/MedQ  D:  08/25/2006 10:03:43  T:  08/25/2006 16:15:22  Job #:  [id number]   cc:   Lonie Peak, P.A.

## 2011-02-08 NOTE — Assessment & Plan Note (Signed)
MEDICAL RECORD NUMBER:  [id number]   HISTORY OF PRESENT ILLNESS:  Karina Greer is back regarding her fibromyalgia and  low back pain.  We sent her for an MRI of the lumbar spine which was  completely normal.  She continues to have low back pain, however,  particularly in the central low back and into the right hip.  She has seen  Dr. Leonides Cave for psychological support which she has found helpful, and she  continues to see Dr. Anna Genre for Psychiatric adjustment.  Dr. Anna Genre is  adjusting her Celexa and plans to try some Wellbutrin at the end of the  month.  She has had some problems with urinary tract infection which  apparently have not been treated adequately at this point, and she has  dysuria, frequency, etc with severe abdominal pain centrally.  She rates her  pain an 8/10 in general.  She is unable to tolerate DHEA or the Lyrica  previously.  She is going to stick with the magnesium 250 mg daily without  obvious problems.  She finds the Flexeril is helpful for sleep.  The patient  rates her pain today at an 8-9/10, describes it as sharp and aching.  The  pain is worse throughout the day.  Sleep is fair to poor at 4-5 hours a  night.  Pain is worse with walking, bending, sitting and improves with  medication.  She does not stretch aggressively.   REVIEW OF SYSTEMS:  The patient describes bladder issues as above, as well  as, trouble walking, confusion, depression, intermittent nausea, dysuria,  abdominal pain.  She denies any cardiorespiratory complaints.   SOCIAL HISTORY:  The patient does not report any new changes today.  Pertinent positives as listed above.   PHYSICAL EXAMINATION:  VITAL SIGNS:  Blood pressure 133/85, pulse 91,  respirations 16, saturating 95% on room air.  GENERAL APPEARANCE:  The patient is pleasant and appears less negative  today.  She remains obese.  She is alert and oriented x3.  Affect is fair.  Gait is slightly wide based and she favors the right side somewhat  with  ambulation.  Coordination is fair.  Reflexes are 2+.  Sensory exam is  normal.  She had equivocal Patrick's test bilaterally today.  Compression  test was equivocal to negative.  Straight-leg testing was negative.  She did  have some pain along the right greater trochanter, and with piriformis  testing.  The pain was more in the posterior region of the fair trochanter.  BACK:  Range of motion was essentially stable.  She had tight musculature in  the hamstrings and adductors today of the right hip and somewhat in the hip  abductors and extensors.  NEUROLOGICAL:  Intact.   ASSESSMENT:  1.  Fibromyalgia.  2.  Low back/right hip pain consistent with greater trochanter bursitis and      tightness of the hip external rotators.  3.  Insomnia.  4.  Anxiety/depression.  5.  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  Facet problems appear less likely after      normal MRI.   PLAN:  1.  Continue Flexeril at bedtime at 10-20 mg.  2.  Celexa 20 mg q.h.s. and further adjustment per Dr. Anna Genre.  3.  Encouraged further psychological follow up with Dr. Leonides Cave.  4.  Will hold off on DHEA secondary to the patient's intolerance currently.      The patient may continue with her magnesium daily.  5.  After informed consent, we injected the  right hip at the insertion of      multiple muscles along the posterior greater trochanter with 40 mg of      Kenalog and 3 cc of 1% Lidocaine.  The patient tolerated the procedure      well.  6.  Stressed the importance of stretching and exercises.  Gave her a low      back exercise packet to practice on at      home.  She needs to do this daily.  7.  I will see her back in approximately 1-2 months time.       ZTS/MedQ  D:  04/08/2005 13:20:33  T:  04/09/2005 06:34:15  Job #:  [id number]

## 2011-02-08 NOTE — Assessment & Plan Note (Signed)
Karina Greer is back regarding her fibromyalgia.  She is in about the same  shape as she has been in.  She has her ups and downs.  The pain ranges  from an 8 to a 10 out of 10.  She has been put on a bladder treatment by  Dr. [name] which is helping some of her interstitial cystitis symptoms.  She has fair to poor sleep still.  The pain is diffuse and general.  She  still has some problems with focus and memory.  She sees Dr. [name] in  [place] for mood and coping mechanism.  She is working on stress  relief and relaxation strategies but is still having a hard time with  this.  She is taking multiple supplements, like I have recommended.  Sometimes she fells overwhelmed by the pills.   REVIEW OF SYSTEMS:  Notable for multiple issues including numbness,  tremor, tingling, anxiety, depression, night sweats, fever, poor  appetite, urinary retention, bladder discomfort, shortness of breath.  Full review is in the written health and history section.   SOCIAL HISTORY:  Without change.  She lives with her children.  She is  divorced.   PHYSICAL EXAMINATION:  VITAL SIGNS:  Blood pressure is 140/76, pulse is  84, respiratory rate 16.  She is sating 97% on room air.  GENERAL:  The patient is pleasant, alert and oriented x3.  Affect is  generally flat and anxious.  Gait is normal.  MUSCULOSKELETAL:  She has generalized tenderness throughout once again  in the cervical region, shoulders, back, knees, ankles.  HEART:  Regular rate.  CHEST:  Clear.  ABDOMEN:  Soft nontender.  NEUROLOGIC:  Motor function is 5 out of 5.  Sensory exam is two plus as  well as reflexes.   ASSESSMENT:  1. Fibromyalgia syndrome.  2. Insomnia.  3. Severe anxiety and depression.  4. Questionable asthma.  5. History of sacroiliac joint dysfunction.  6. Interstitial cystitis.   PLAN:  1. Continue with psychological followup.  I asked her to continue      trying to work with Dr. [name] to learn relaxation techniques.  2. We will begin a trial of Requip for sleep and restless leg type      symptoms in the evening to see if we can improve the quality of her      rest.  3. Norco 10/325 for breakthrough pain.  4. Continue her multiple supplements.  She certainly could back off      the vitamin C somewhat and maybe condense that and the magnesium      into a multivitamin.  5. Continue exercise and appropriate diet.  6. Follow up with Dr. [name] for cystitis.  7. I will see the patient back in about two months' time.      [name], M.D.  Electronically Signed     ZTS/MedQ  D:  01/16/2007 13:33:16  T:  01/16/2007 14:31:53  Job #:  [id number]   cc:   [name], M.D.  Fax: [phone]   [name], Dr.

## 2011-02-08 NOTE — Assessment & Plan Note (Signed)
MEDICAL RECORD NUMBER:  [id number]   Karina Greer is back regarding her fibromyalgia. She has seen Dr. Donell Beers and Dr.  Leonides Cave in the interim. I had a discussion with Dr. Leonides Cave a few weeks ago  about the patient's history of [facility] spotted fever. The patient  apparently had a long hospitalization. The patient has been monitored by Dr.  Donell Beers closely for her mood-related issues. She is currently on Celexa and  lorazepam. She still complains of decreased mood, forgetfulness, blurred  vision. She has low back pain and the irritable bowel symptoms. She also  complains of a lot of anxiety.   The patient stated that her [facility] spotted fever case took place in  1999 or 2000. She had a prolonged treatment course and apparently a relapse.  The patient reports her pain today at an 8/10. Describes it as constant,  aching, stabbing. It interferes with general activity, relations with  others, and enjoyment of life on a severe level. The patient's sleep is fair  although she states it could be better. The pain is worse with walking,  bending, sitting, and general activities. She is really not exercising much.  She does walk. Her pain is prominent in the low back and to the right leg.   I did review her MRI again from June and the report essentially was normal.  I have not reviewed the hard copy.   REVIEW OF SYSTEMS:  The patient reports bowel control problems, bladder  pressure, depression, anxiety, blurred vision, weight gain, vomiting,  nausea, abdominal pain, limb swelling, and the issues mentioned above.   SOCIAL HISTORY:  The patient is married and at home with her children.   PHYSICAL EXAMINATION:  Blood pressure 132/85, pulse is 120, respiratory rate  16. She is saturating 97% in room air. The patient is generally flat in  affect and significantly overweight. She is oriented to place and person  although she has poor awareness and attention. Gait was shuffling.  Coordination  was decreased. Reflexes 2+. I saw no focal motor loss in the  lower extremities. Sensation was generally normal. The patient's seated  slump test and straight leg raises were negative to equivocal bilaterally.  She only had some tightness in the back and tightness in the bilateral  hamstrings. Heart was regular rate and rhythm, lungs were clear, abdomen  soft and nontender.   ASSESSMENT:  1.  Fibromyalgia.  2.  Low back and right hip pain, appears to be more related to tight hip      muscular. I see no obvious signs of radiculopathy.  3.  Insomnia.  4.  Anxiety/depression.  5.  Question sacroiliac joint syndrome.  6.  Questionable chronic Lyme disease manifesting as fibromyalgia symptoms.   PLAN:  1.  The patient may increase her Flexeril to 30 mg q.h.s.  2.  Will send the patient for a Lyme screen including serum IgG and IgM.  3.  Continue magnesium.  4.  Continue psychological/psychiatric follow-up.  5.  I refilled Norco 10/325 #90.  6.  The patient needs to address mobility and exercises.  7.  I will see her back in 1 month's time.      [name], M.D.  Electronically Signed     ZTS/MedQ  D:  08/27/2005 16:06:47  T:  08/28/2005 09:39:16  Job #:  [id number]

## 2011-02-08 NOTE — Assessment & Plan Note (Signed)
Karina Greer is here in followup of her diffuse pain which we felt was most  consistent with fibromyalgia.  Since the last visit, she has had significant  problem with a cold and respiratory infection.  She states that she was  unable to pay for __________ due to copay. She felt that the Restoril we  called in was unhelpful for sleep.  She states that she could not tolerate  the Lyrica and does not recall why.  Norco 10/325 helped somewhat with her  pain. She has been on 2 or 3 antibiotics since I last saw her. She has had  some coughing and upper respiratory symptoms. She has had some chest wall  pain more recently.  The patient reports her pain at a 7-8/10 level.  The  pain is most prominent in the low back as well as both hips migrating to the  left as well as below the rib cage anteriorly. She also has pain elsewhere  through the ankles, knees, wrists, elbows, shoulders, neck and occipital  areas.  She continues to have a problem with anxiety.  She notes some  swelling in her fingers and hands.   SOCIAL HISTORY:  The patient is living with three kids and married. She  plans on going back to work at some point.   REVIEW OF SYMPTOMS:  The patient reports chest pain, coughing, cold, flu,  symptoms, weakness, numbness, confusion, blurred vision, anxiety, problems  with sleep, hearing, headaches, more recently nausea, reflux, frequent  urination, abdominal pain, problems with appetite, swelling in the right leg  and fingers and hands.   PHYSICAL EXAMINATION:  Blood pressure is 122/61, pulse 83, respiratory rate  18, she is sating 99% on room air. The patient walks with a fairly normal  gait unless she shuffles on occasion. Affect is bright and anxious,  appearance is generally well kept. The patient is obese particularly  abdominally. The patient has pain throughout characteristic tender point  areas. She is particularly tender along the anterior rib cage.  The  bilateral greater trochanters  were particularly tender today as well. Both  PSIS areas were painful somewhat to palpation. She had fair lumbar range of  motion without significant change. Motor and sensory exam were intact in  both upper and lower extremities. Cervical range of motion was fair today  without much additional pain provoked.  Shoulder movement was good. Strength  seemed to be better today in the upper extremities when compared to her last  exam.  Refluxes remain 1+, sensory exam is normal.  Cranial nerve exam was  intact.   ASSESSMENT:  1.  Chronic pain syndrome most consistent with fibromyalgia.  2.  Significant anxiety and depression.  3.  Recent chest pain related to upper respiratory tract illness.  4.  Insomnia.  5.  Bilateral hip pain consistent with greater trochanter bursitis.   PLAN:  1.  I would like to restart the Lyrica at some point but will hold at this      time due to her intolerance.  May try once pulmonary issues are      decreased.  2.  Will check a chest x-ray to rule out any rib fractures or effusion. She      would benefit from a trial of Lidoderm patches to the painful rib areas      which we provided her today.  3.  I refilled Lorcet.  4.  I would like to perform a set of fibromyalgia screening tests to  include      a.m. cortisol, estrogen, testosterone, progesterone, DHEA levels.  Also      will check CMET, magnesium phosphorus.  5.  Will check thyroid function tests.  6.  Check complete blood count.  7.  For sleep will try Trazodone 50-100 mg q.h.s.  8.  After informed consent, we injected both hips today with 40 mg of      Kenalog and 2 mL of 1% lidocaine. The      patient tolerated these well.  9.  I will the patient back in about one month's time.      ZTS/MedQ  D:  11/19/2004 12:25:28  T:  11/19/2004 13:56:25  Job #:  [id number]   cc:   Lonie Peak, M.D.  [facility]

## 2011-02-08 NOTE — Assessment & Plan Note (Signed)
MEDICAL RECORD NUMBER [id number]  Karina Greer is back regarding her fibromyalgia. We started her on Celexa as she  had tried this in the past and patient felt that the Celexa made her worse.  She felt like she was more tired and felt like her right foot was dragging.  She complains of increased low back pain. The patient saw Dr. Leonides Cave for  psychological support and evaluation. Dr. Leonides Cave notes that the patient had  multiple symptoms of psychological distress including anxiety, irritable  mood, preoccupation with pain and pessimism about pain relief and  helplessness. Dr. Leonides Cave is working on relaxation techniques and other  supports. The patient seemed fairly open to working with him. The patient's  DHEA level returned and was noted to be slightly in the low to borderline  low range at 141. Patient's pain levels are 7 to 8 out of 10. Pain is worse  in the neck and is causing headaches. She has chest pain, waist pain, pain  in the legs. The pain is described as sharp, intermittent stabbing aching.  The pain is worse with walking, bending, sitting. Her medication which  includes Flexeril at bed time helps somewhat with her sleep. The Narco helps  with her break through pain at 10/325 one p.o. q.8h p.r.n.   LABORATORY VALUES:  Generally labs are within normal range except borderline  low magnesium at 1.9, and the DHEA as mentioned above.   SOCIAL HISTORY:  The patient remains married and has stresses at the home  which tend to distract her.   REVIEW OF SYSTEMS:  The patient reports dizziness, numbness, confusion,  depression, problems with bladder, frequency, nausea with medications and  without medications. Irritable bowel symptoms. Poor sleep.   PHYSICAL EXAMINATION:  Blood pressure is 119/86, pulse is 104, respiratory  rate 18, she is saturating 100% on room air. The patient remains anxious and  negative in her outlook. She was very confrontational with me today about  certain topics. Heart was  regular rate and rhythm, lungs were clear. The  patient remains significantly obese. Gait is slightly wide based. The  patient's coordination is fair. Reflexes are 2+. Sensation is generally  intact. The patient is limited with lumbar flexion at about 30-40 degrees.  Extension at 5-10 degrees elicited pain as well today. Both PSIS areas were  painful as well as the gluteus maximus and lumbar flank musculature.  Trochanter regions were grossly tender. Neurological exam was generally  intact throughout.   ASSESSMENT:  1.  Fibromyalgia.  2.  Anxiety/depression.  3.  Insomnia.  4.  Bilateral hip pain.  5.  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   PLAN:  1.  Continue Flexeril at h.s., 10 to 20 mg q.h.s.  2.  For mood will be on Lexapro 10 mg q.h.s.  3.  Continue follow up with Dr. Leonides Cave.  4.  Begin low-dose DHEA supplement at 12.5 mg daily.  5.  Begin magnesium 250 mg daily.  6.  Encourage exercise and range of motion activities. The patient is      hesitant to do this due to lack of positive      reinforcement.  7.  I will see the patient back in about 1-2 month's time.      ZTS/MedQ  D:  02/05/2005 17:33:08  T:  02/06/2005 05:51:38  Job #:  [id number]

## 2011-02-08 NOTE — Group Therapy Note (Signed)
MEDICAL RECORD NUMBER:  [id number]   DATE OF VISIT:  October 17, 2004   REFERRING PHYSICIAN:  [facility]/Nathan Eber Hong.   CHIEF COMPLAINT:  Diffuse body pain and fatigue.   HISTORY OF PRESENT ILLNESS:  This is a 45 year old white female with a long  history of body pain, particularly in the right hip and low back.  She was  diagnosed and treated for Lyme's disease in 1988 and 1999.  She tells me  that she has had problems ever since then.  She notes over the last year  that her pain has increased.  She quit her job in March 2005 due to  significant hip pain.  In October 2005, her symptoms accelerated to where  she had more diffuse body pain.  She has had workup for osteoporosis which  has revealed osteoporosis in the back and hips apparently,.  I do not have  formal report to review.  The patient has complained of jaw pain, pain in  the shoulders, low back, right hip, and often left hip, as well as feet and  hand pain and swelling.  She has seen Dr. Phylliss Bob for rheumatologic workup  which really proved negative.  She had seen Dr. Melbourne Abts in August of last  year for MS workup due to some grey-white junction changes seen on T2  imaging.  His workup essentially was negative for any definite evidence of  multiple sclerosis.  The patient has had off and on headaches for 10+ years.  She has had problems with depression and anxiety.  She complains of growing  sleep issues.  She only sleeps 2 to 3 hours a night due to stiffness,  freezing, and pain in the hip and low back regions particularly.  Her  symptoms generally worsen in cold and bad weather.  She has tried a number  of medications for helping her pain and mood including Effexor, Elavil,  Skelaxin, Flexeril, Naproxen, Prozac, Cymbalta, Zoloft, Lexapro, Wellbutrin,  Ultram, Ultracet, oxycodone, Celebrex.  The list goes on and on.  She has  had definite intolerances of SKELAXIN as well as ULTRAM, ULTRACET,  OXYCODONE,  and CYMBALTA.   When the patient has an exacerbation of her pain, she tends to use Lorcet 10  with Klonopin in a hot bath as well as stretching to help her relax.  This  helps to a certain extent.  She used to walk several miles each day before  symptoms increased, and now she is lucky to walk a block before pain sets  in. She rates her pain overall at 10/10.  It may improve somewhat with ice  and heat and worse with walking and working.   CURRENT MEDICATIONS:  1.  Fosamax 70 mg.  2.  Klonopin 1 mg q.h.s.  3.  Lorcet 1/2 to 1 b.i.d., although she also takes 3 or 4 a day when pain      is worse.  4.  Urocit-K daily.  5.  Phenergan p.r.n. with her medications as she also has nausea with      LORCET.  6.  Magnesium supplement daily.   I have no x-rays to review nor from the lab reports to review at this visit  today.   PAST MEDICAL HISTORY:  1.  Osteoporosis.  2.  A 10-year history of migraine headaches.  3.  Shoulder surgery in 2000.  4.  Surgery for an ovarian cyst with tubal ligation per the patient's      report.  5.  Multiple problems with nephrolithiasis.  6.  She also reports irritable bowel syndrome.  7.  TMJ.  8.  Difficulties with frequent yeast infections.  9.  Anxiety/depression.  10. I believe she has had psychiatric followup.  I do not have any reports      here to that nature in available chart and information in hand.   Her current medications are listed above.   MEDICATION ALLERGIES:  Multiple.  The patient did not bring in a list of  allergies and intolerances.  These definitely include SKELAXIN, CYMBALTA,  ULTRAM, ULTRACET, and OXYCODONE.   SOCIAL HISTORY:  The patient is married, has three children ages 29, 68, and  7.  She last worked in March of last year.  She would like to go back to  work but does not feel that she is ready.   REVIEW OF SYSTEMS:  Pertinent positives are listed above.  The patient does  report occasional chest pain, weakness,  numbness, dizziness, spasm of neck,  problems with confusion and concentration, blurred vision, anxiety,  depression, agitation, headaches which have been actually better of late,  nausea, diarrhea, constipation, swelling in hands and lower extremities.  She also appears to have gained weight over the last several months.   PHYSICAL EXAMINATION:  VITAL SIGNS:  Blood pressure 133/81, pulse 71,  respiratory rate 16, She is saturating 97% on room air.  GENERAL:  The patient walks with a limp, really favoring alternating sides  depending on the movement in her gait process.  Affect is alert.  She is a  bit flat.  Appearance is fairly well kept.  HEENT:  Pupils equal, round, and reactive to light and accommodation.  Extraocular movements are intact.  Head is normocephalic and atraumatic.  HEART:  Regular rate and rhythm.  LUNGS:  Clear.  ABDOMEN:  Soft, nontender.  The patient is slightly obese.  EXTREMITIES:  No clubbing or cyanosis.  She has some trace edema in the  hands and 1+ pitting edema in the pretibial regions and feet.  Skin appears  to be generally intact throughout, and pulses were 2+ and regular.  MUSCULOSKELETAL:  Neck examination significant for pain along the  suboccipital ridge and associated paraspinal muscles below that to a lesser  extent. The right lower trap and medial upper border of the scapula was  tender on the left side more than the right.  Mid trap was less tender  today.  She had pain on palpation of the upper rib cage along the sternum.  Shoulder, humeral heads, and glenoid regions were minimally tender.  Elbows  were tender bilaterally at the epicondyles.  Hands were tender along the  distal phalangeal joints.  No obvious joint abnormalities were seen there.  She has diminished low back range of motion at 40 degrees flexion, 10  degrees extension, and 10 to 15 degrees lateral bending and rotation on either side.  There was minimal pain on palpation of the  lumbar facets and  paraspinal's.  She did have significant tenderness along the bilateral PS IF  areas, left more so than right.  There was also tenderness along the left  greater trochanter and left piriformis region.  Medial femoral condyles were  tender today.  She had some generalized tenderness in the feet but to a  lesser extent today.  Strength was in the 3+ to 4+/5 range in both lower  extremities, very inconsistent depending on level of pain.  Upper extremity  strength was  much less in the range of 2+ to 3/5 generally, but his is  inconsistent and I feel unreliable due to some of her pain complaints.  She  had really normal range of motion in the elbows, fingers, and hands.  She  had a bit diminution in right shoulder movement today.  Bilateral hips had  free range.  She had some tightness with straight leg raise, but this is  minimal.  She only had minimal back pain with left-sided straight leg raise.  Patrick's tests were equivocal to negative bilaterally.  Piriformis testing  was negative.  The patient had no obvious tilt or rotation.  Scapulas were  symmetrical as were shoulders.  NEUROLOGIC:  The patient had 1+ reflexes really throughout.  They may have  been a little bit more brisk in the upper extremities.  Sensory exam was  just about normal.  I found no focal abnormalities.  Motor exam as mentioned  above.  Cranial nerve exam was intact.  Cognitively, the patient seemed to  be appropriate.  She was a bit anxious and did lose concentration at times,  but I found no focal abnormalities on generalized testing today.   ASSESSMENT:  1.  This is a complex patient who appears to have chronic pain syndrome      indicative of some type of myalgia.  She has been tried on multiple      medications in the past and seems to have multiple intolerances.  She      has multiple other symptoms associated with her fibromyalgia including      chronic fatigue, decreased memory and  concentration, irritable bowel      syndrome, temporomandibular joint, insomnia, etc.  2.  The patient has been worked up for multiple sclerosis in the past with      inconclusive results.  3.  The patient has had a negative rheumatologic screen.  4.  Anxiety/depression.   PLAN:  1.  I would like to get the patient started on Lyrica which has been proven      helpful for fibromyalgia patients at higher doses.  We will begin her at      50 mg q.h.s. x 3 days and titrate her up to 150 mg b.i.d. after about      two weeks' time.  My target dosing would be 450 to 600 mg daily.  2.  I refilled her Lorcet today.  3.  I would like to review the formal x-rays and lab work that she has had      done through her primary care physician.  I also need her to bring in      her vitamin supplementation specifics.  4.  I would like to work up this patient from an endocrine standpoint to      asses cortisol and other hormones that may be potentially out of     balance.  I also would like to focus on any mineral or vitamin      deficiencies that she may have.  5.  Need to restore sleep.  We will begin Rozerem at 8 mg q.h.s. to work on      melatonin receptors.  6.  I would like to eventually address mood once again, either by further      medication or psychological support.  7.  I will see the patient back in one month's time.  My need additional      workup as far as her low back  and hips are concerned, although this more      than likely is a manifestation of fibromyalgia.   I would like to thank Lonie Peak at [facility] for the  referral of this kind lady.      ZTS/MedQ  D:  10/17/2004 11:52:01  T:  10/17/2004 13:08:22  Job #:  [id number]   cc:   [facility]  Lonie Peak, [place].A.  [address]  [place], [place] [address]

## 2011-02-08 NOTE — Assessment & Plan Note (Signed)
Monday, October 20, 2006:   HISTORY:  Karina Greer is back regarding her fibromyalgia.  She did not  tolerate the Lyrica due to swelling.  She did not feel like it helped at  lower doses either.  We placed her on Sanctura for spasms at night and  this was not helpful either.  She was placed on Elavil by her PCP which  we have used before unsuccessfully.  Celebrex irritates her stomach.  Reports pain diffusely.  She rates it 8 to 9 out of 10.  Pain is  stabbing, aching, constant and at times sharp.  She has had some sharp  chest pain as well.  She has been worked up in the past by cardiology  for this.  The Pulmicort inhaler seems to help somewhat with her  breathing.  Sleep is poor.  She reports generalized muscle tightness  and spasm.  Pain increases with walking, bending, sitting, standing and  some activities.  She has seen Dr. Mariann Laster in [place] who is a  psychologist x1 visit.  More follow-up is planned.   REVIEW OF SYSTEMS:  Notable for multiple issues including bladder  control problems, numbness, tremors, spasms, trouble walking, dizziness,  confusion, depression, anxiety, suicidal thoughts although she expressed  no will to carry through with these, chills, weight gain, night sweats,  skin rash, bleeding, vomiting, nausea, limb swelling, poor appetite.  She is using less Phenergan.   SOCIAL HISTORY:  Without significant change.   PHYSICAL EXAMINATION:  VITAL SIGNS:  Blood pressure is 123/71, pulse is  96, respirations 16.  She is sating 98% on room air.  GENERAL:  The patient is pleasant, no acute distress.  She is alert and  oriented x3.  Affect a bit flat and anxious at times, however.  Gait is  generally stable.  She has generalized tenderness throughout limbs,  chest, trunk, abdomen, hypogastric region, etc.  Reflexes are 2+.  Sensation normal.  HEART:  Regular rate and rhythm.  CHEST:  Clear.  ABDOMEN:  Soft, nontender.   ASSESSMENT:  1. Fibromyalgia  syndrome.  2. Insomnia.  3. Severe anxiety and depression.  4. Questionable asthma.  5. History of sacroiliac joint dysfunction.  6. Likely interstitial cystitis.   PLAN:  1. The patient sees Dr. [name] today for follow-up.  She may need more      investigation into her bladder issues.  I know she has had kidney      stones in the past although interstitial cystitis is very commonly      associated with fibromyalgia.  2. Will increase Celexa at 80 mg daily and observe for results.  She      feels that this does have some sort of a calming effect on her.  3. Refill Narco 10/325 one q. 6 hours p.r.n. #90.  4. Wrote down multiple supplements including DHEA which I want her to      increase to 25 mg b.i.d.  She can also try Omega 3 fatty acids,      Ribose, Coenzyme Q, NE1, cherry extract, etc. which I found helpful      with fibromyalgia patients.  5. The patient will follow-up with Dr. Letta Moynahan in [facility] for      psychological issues.  Her anxiety and depression weigh heavily on      her somatic complaints.  She has poor coping and adjustment      mechanisms.  6. Encourage exercise which is something she lacks as well.  7.  I will see her back in about three months time.      [name], M.D.  Electronically Signed     ZTS/MedQ  D:  10/20/2006 12:05:35  T:  10/20/2006 12:41:18  Job #:  [id number]   cc:   [name], M.D.  Fax: [phone]   Dr. Mariann Laster

## 2011-02-25 ENCOUNTER — Encounter: Payer: Medicare Other | Attending: Physical Medicine & Rehabilitation | Admitting: Physical Medicine & Rehabilitation

## 2011-02-25 DIAGNOSIS — N3010 Interstitial cystitis (chronic) without hematuria: Secondary | ICD-10-CM | POA: Insufficient documentation

## 2011-02-25 DIAGNOSIS — K589 Irritable bowel syndrome without diarrhea: Secondary | ICD-10-CM | POA: Insufficient documentation

## 2011-02-25 DIAGNOSIS — E539 Vitamin B deficiency, unspecified: Secondary | ICD-10-CM | POA: Insufficient documentation

## 2011-02-25 DIAGNOSIS — G43909 Migraine, unspecified, not intractable, without status migrainosus: Secondary | ICD-10-CM | POA: Insufficient documentation

## 2011-02-25 DIAGNOSIS — F341 Dysthymic disorder: Secondary | ICD-10-CM

## 2011-02-25 DIAGNOSIS — M79609 Pain in unspecified limb: Secondary | ICD-10-CM | POA: Insufficient documentation

## 2011-02-25 DIAGNOSIS — M47817 Spondylosis without myelopathy or radiculopathy, lumbosacral region: Secondary | ICD-10-CM

## 2011-02-25 DIAGNOSIS — M25559 Pain in unspecified hip: Secondary | ICD-10-CM | POA: Insufficient documentation

## 2011-02-25 DIAGNOSIS — M722 Plantar fascial fibromatosis: Secondary | ICD-10-CM | POA: Insufficient documentation

## 2011-02-25 DIAGNOSIS — IMO0001 Reserved for inherently not codable concepts without codable children: Secondary | ICD-10-CM | POA: Insufficient documentation

## 2011-02-25 DIAGNOSIS — M545 Low back pain, unspecified: Secondary | ICD-10-CM | POA: Insufficient documentation

## 2011-02-26 NOTE — Assessment & Plan Note (Signed)
Karina Greer is back regarding her fibromyalgia.  She complains of bilateral arch pain radiating up into the thighs on a daily basis.  She states there is no rhyme or reason to it.  It hurts her with rest or with walking.  She also complains of left low back pain and right hip pain. She is working with Psychology regarding coping and stress relief strategy.  She also has a psychiatrist now to.  She sees Urology regarding her bladder.  She states that she has been walking more and trying to walk on a daily basis about half a mile.  She states that the exercise makes her hurt for the most part, but she is doing it as part of her agreement to be more active.  REVIEW OF SYSTEMS:  Notable for multiple items.  These are all listed in the written health and history section of the chart.  SOCIAL HISTORY:  The patient is divorced, living with her kids and parents currently.  No other changes are noted.  PHYSICAL EXAMINATION:  VITAL SIGNS:  Blood pressure is 121/80, pulse 94, respiratory rate 18.  She is satting 96% on room air.  The patient's weight is increased, I believe from last visit. EXTREMITIES:  Strength is 5/5 and normal sensation.  She had some mild pain over the medial arches on either foot particularly with resisted inversion.  Resisted plantar flexion caused mild discomfort only.  He had some mild pain at the right greater trochanter and some discomfort on lumbar paraspinals about the L1 level on the left. HEART:  Regular. CHEST:  Clear. ABDOMEN:  Soft, nontender. PSYCHIATRICALLY:  She is stable.  She has fair insight, memory and awareness.  Focus is fair.  ASSESSMENT: 1. Fibromyalgia syndrome with myofascial pain. 2. Anxiety with depression. 3. Interstitial cystitis and irritable bowel syndrome. 4. Vitamin D deficiency. 5. Migraine headaches. 6. Bilateral plantar fasciitis likely induced by increase to exercise     regimen.  PLAN: 1. We injected the left lumbar trigger point  with 2 mL of 1%     lidocaine.  The patient tolerated well.  I asked her to work on     improved posture and technique with her gait and exercises. 2. Discussed stretching, ice and adequate medial arch supports for her     shoes.  I think she will do better also when she varies her     exercise routine up a bit as well.  Weight loss also will be a     benefit. 3. Continue psychiatric and psychological counseling. 4. I refilled her hydrocodone today. 5. I will see her back in about 4 months.     [name], M.D. Electronically Signed    ZTS/MedQ D:  02/25/2011 11:18:45  T:  02/25/2011 23:55:25  Job #:  [id number]  cc:   Lonie Peak, [place] Fax: [phone]

## 2011-04-01 ENCOUNTER — Encounter: Payer: Self-pay | Admitting: Physical Medicine & Rehabilitation

## 2011-04-24 ENCOUNTER — Encounter: Payer: Self-pay | Admitting: Physical Medicine & Rehabilitation

## 2011-05-25 ENCOUNTER — Encounter: Payer: Self-pay | Admitting: Physical Medicine & Rehabilitation

## 2011-06-17 ENCOUNTER — Encounter: Payer: Medicare Other | Attending: Physical Medicine & Rehabilitation | Admitting: Physical Medicine & Rehabilitation

## 2011-06-17 DIAGNOSIS — M545 Low back pain, unspecified: Secondary | ICD-10-CM | POA: Insufficient documentation

## 2011-06-17 DIAGNOSIS — R269 Unspecified abnormalities of gait and mobility: Secondary | ICD-10-CM

## 2011-06-17 DIAGNOSIS — F411 Generalized anxiety disorder: Secondary | ICD-10-CM | POA: Insufficient documentation

## 2011-06-17 DIAGNOSIS — K589 Irritable bowel syndrome without diarrhea: Secondary | ICD-10-CM | POA: Insufficient documentation

## 2011-06-17 DIAGNOSIS — IMO0001 Reserved for inherently not codable concepts without codable children: Secondary | ICD-10-CM

## 2011-06-17 DIAGNOSIS — E559 Vitamin D deficiency, unspecified: Secondary | ICD-10-CM | POA: Insufficient documentation

## 2011-06-17 DIAGNOSIS — F3289 Other specified depressive episodes: Secondary | ICD-10-CM | POA: Insufficient documentation

## 2011-06-17 DIAGNOSIS — F329 Major depressive disorder, single episode, unspecified: Secondary | ICD-10-CM | POA: Insufficient documentation

## 2011-06-17 DIAGNOSIS — N3010 Interstitial cystitis (chronic) without hematuria: Secondary | ICD-10-CM | POA: Insufficient documentation

## 2011-06-17 DIAGNOSIS — F341 Dysthymic disorder: Secondary | ICD-10-CM

## 2011-06-17 DIAGNOSIS — M4600 Spinal enthesopathy, site unspecified: Secondary | ICD-10-CM

## 2011-06-17 DIAGNOSIS — G43909 Migraine, unspecified, not intractable, without status migrainosus: Secondary | ICD-10-CM | POA: Insufficient documentation

## 2011-06-17 NOTE — Assessment & Plan Note (Signed)
Karina Greer is back regarding her fibromyalgia syndrome.  She states that she has had some lower pain recently with the cooler weather.  She is afraid of a cold weather and the winter coming up.  She did some therapy on her back this summer at [facility], and very much like the therapy she did. She unfortunately missed the last few sessions after she fell down her stairs.  She was placed on spironolactone by her family physician for "edema."  She does not know that it is helped a great deal and is not sure that she was to continue taking it.  She rates her pain as 7 to 10/10.  She is trying to exercise a bit more.  She can walk about 15-20 minutes without stopping.  She still involved in psychological services for her mood and depression.  REVIEW OF SYSTEMS:  Notable for the above.  Full 12-point review is in the written health and history section of the chart.  She still has some urinary urgency, which she sees Neurology for.  SOCIAL HISTORY:  The patient is single, living with her children and parents.  PHYSICAL EXAMINATION:  VITAL SIGNS:  Blood pressure 119/80, pulse 90, respiratory rate 16, she is satting 97% on room air. GENERAL:  The patient is pleasant, alert.  She remains overweight.  I saw no some swelling in either leg.  Strength is grossly 5/5 and normal sensory function today.  She has some generalized tenderness about the hands, shoulders, legs, and feet.  She can sits without any issues.  I thought her mood and affect was a bit brighter.  Her attention and purpose was better. HEART:  Regular. CHEST:  Clear. ABDOMEN:  Soft, nontender.  She had some tenderness in the low back with palpation today particularly towards the right lower quadrant.  She is able to flex to about 60 degrees and laterally bend to about 20-30, extend to 15-20 with pain in the central to right lower back with movement in all directions.  ASSESSMENT: 1. Fibromyalgia syndrome with myofascial pain. 2.  Anxiety with depression. 3. Vitamin D deficiency. 4. Interstitial cystitis and irritable bowel syndrome. 5. Migraine headaches. 6. Low back pain.  PLAN: 1. Again discussed the fact that she needs to continue with exercise,     range of motion, and weight loss.  Weight loss will probably be the     best recipe for edema control.  We discussed TED stockings as well.     I am not sure if the diuretic is right for this. 2. Continue counseling.  We discussed the fact that she needs to work     on positive attitude, goal-oriented behavior, and better coping     skills as a whole. 3. Recommended omega-3 fatty acids that help with muscle integrity as     well as any nerve pain and dysesthesias that she has.  She should     continue with her vitamin D supplementation as well. 4. We will make referral back to outpatient therapy to work on low     back, range of motion, general exercise and maintenance. 5. Refill hydrocodone 10/325, #120. 6. I will see her back in 4 months.     [name], M.D. Electronically Signed    ZTS/MedQ D:  06/17/2011 12:25:45  T:  06/17/2011 12:51:12  Job #:  [id number]

## 2011-06-18 LAB — POCT HEMOGLOBIN-HEMACUE
Hemoglobin: 15.2 — ABNORMAL HIGH
Operator id: 268271

## 2011-08-19 ENCOUNTER — Ambulatory Visit: Payer: Self-pay

## 2011-10-07 ENCOUNTER — Encounter: Payer: Self-pay | Admitting: *Deleted

## 2011-10-08 ENCOUNTER — Encounter: Payer: Medicare Other | Attending: Physical Medicine & Rehabilitation | Admitting: Physical Medicine & Rehabilitation

## 2011-10-10 ENCOUNTER — Encounter: Payer: Medicare Other | Attending: Neurosurgery | Admitting: Neurosurgery

## 2011-10-10 DIAGNOSIS — IMO0001 Reserved for inherently not codable concepts without codable children: Secondary | ICD-10-CM | POA: Insufficient documentation

## 2011-10-10 DIAGNOSIS — F329 Major depressive disorder, single episode, unspecified: Secondary | ICD-10-CM | POA: Diagnosis not present

## 2011-10-10 DIAGNOSIS — M545 Low back pain, unspecified: Secondary | ICD-10-CM | POA: Insufficient documentation

## 2011-10-10 DIAGNOSIS — F3289 Other specified depressive episodes: Secondary | ICD-10-CM | POA: Insufficient documentation

## 2011-10-10 DIAGNOSIS — F411 Generalized anxiety disorder: Secondary | ICD-10-CM | POA: Insufficient documentation

## 2011-10-10 DIAGNOSIS — F341 Dysthymic disorder: Secondary | ICD-10-CM | POA: Diagnosis not present

## 2011-10-11 NOTE — Assessment & Plan Note (Signed)
This is a patient of Dr. Riley Kill, seen for fibromyalgia.  She rates her average pain as 7 or 8, it is a sharp, stabbing, tingling, aching pain. General activity level is 5-7.  Pain is the same 24 hours a day.  Sleep patterns are fair.  All activities aggravate, medications tend to help. She walks without assistance about 15 minutes at a time.  She climbs steps and drives.  Functionally, she is on disability.  Needs help with ADLs.  REVIEW OF SYSTEMS:  Notable for difficulties as described above as well as some paresthesias, trouble walking, depression, anxiety, and suicidal thoughts for which she was encouraged to see her primary care or ED if that worsens.  She also has some GI issues, coughing, shortness of breath, and wheezing.  Past medical history, social history, and family history unchanged.  PHYSICAL EXAMINATION:  VITAL SIGNS:  Her blood pressure is 152/91, pulse 92, respirations 18, O2 sats 92 on room air. MUSCULOSKELETAL:  Motor strength and sensation intact.  Gait appears to be normal. CONSTITUTIONAL:  She is obese.  She is alert and oriented x3.  ASSESSMENT: 1. Fibromyalgia. 2. Anxiety and depression. 3. History of low back pain.  PLAN:  We will keep her hydrocodone filled.  She will follow up with Dr. Riley Kill in 4 months.  Her questions were encouraged and answered.     Karina Greer L. Blima Dessert Electronically Signed    RLW/MedQ D:  10/10/2011 11:33:13  T:  10/11/2011 01:56:37  Job #:  [id number]

## 2011-10-25 DIAGNOSIS — R002 Palpitations: Secondary | ICD-10-CM | POA: Diagnosis not present

## 2011-10-25 DIAGNOSIS — B009 Herpesviral infection, unspecified: Secondary | ICD-10-CM | POA: Diagnosis not present

## 2011-10-25 DIAGNOSIS — I4902 Ventricular flutter: Secondary | ICD-10-CM | POA: Diagnosis not present

## 2011-10-25 DIAGNOSIS — M25559 Pain in unspecified hip: Secondary | ICD-10-CM | POA: Diagnosis not present

## 2011-10-25 DIAGNOSIS — M79609 Pain in unspecified limb: Secondary | ICD-10-CM | POA: Diagnosis not present

## 2011-11-08 ENCOUNTER — Other Ambulatory Visit: Payer: Self-pay | Admitting: Orthopaedic Surgery

## 2011-11-08 ENCOUNTER — Ambulatory Visit: Payer: Medicare Other | Admitting: Physical Medicine & Rehabilitation

## 2011-11-08 DIAGNOSIS — M545 Low back pain, unspecified: Secondary | ICD-10-CM

## 2011-11-08 DIAGNOSIS — F331 Major depressive disorder, recurrent, moderate: Secondary | ICD-10-CM | POA: Diagnosis not present

## 2011-11-12 ENCOUNTER — Ambulatory Visit
Admission: RE | Admit: 2011-11-12 | Discharge: 2011-11-12 | Disposition: A | Discharge status: Home / Self Care | Payer: Medicare Other | Source: Ambulatory Visit | Attending: Orthopaedic Surgery | Admitting: Orthopaedic Surgery

## 2011-11-12 DIAGNOSIS — M545 Low back pain, unspecified: Secondary | ICD-10-CM

## 2011-11-12 DIAGNOSIS — M5137 Other intervertebral disc degeneration, lumbosacral region: Secondary | ICD-10-CM | POA: Diagnosis not present

## 2011-11-12 DIAGNOSIS — M47817 Spondylosis without myelopathy or radiculopathy, lumbosacral region: Secondary | ICD-10-CM | POA: Diagnosis not present

## 2011-11-18 ENCOUNTER — Other Ambulatory Visit: Payer: Self-pay | Admitting: *Deleted

## 2011-11-18 DIAGNOSIS — M47817 Spondylosis without myelopathy or radiculopathy, lumbosacral region: Secondary | ICD-10-CM | POA: Diagnosis not present

## 2011-11-18 MED ORDER — HYDROCODONE-ACETAMINOPHEN 10-325 MG PO TABS: 1.0000 | ORAL_TABLET | Freq: Four times a day (QID) | ORAL | Status: DC | PRN

## 2011-11-28 DIAGNOSIS — M940 Chondrocostal junction syndrome [Tietze]: Secondary | ICD-10-CM | POA: Diagnosis not present

## 2011-11-28 DIAGNOSIS — R0602 Shortness of breath: Secondary | ICD-10-CM | POA: Diagnosis not present

## 2011-12-03 DIAGNOSIS — J189 Pneumonia, unspecified organism: Secondary | ICD-10-CM | POA: Diagnosis not present

## 2011-12-06 DIAGNOSIS — J189 Pneumonia, unspecified organism: Secondary | ICD-10-CM | POA: Diagnosis not present

## 2011-12-06 DIAGNOSIS — H109 Unspecified conjunctivitis: Secondary | ICD-10-CM | POA: Diagnosis not present

## 2011-12-09 DIAGNOSIS — H209 Unspecified iridocyclitis: Secondary | ICD-10-CM | POA: Diagnosis not present

## 2011-12-09 DIAGNOSIS — J189 Pneumonia, unspecified organism: Secondary | ICD-10-CM | POA: Diagnosis not present

## 2011-12-09 DIAGNOSIS — B0052 Herpesviral keratitis: Secondary | ICD-10-CM | POA: Diagnosis not present

## 2011-12-12 DIAGNOSIS — B0052 Herpesviral keratitis: Secondary | ICD-10-CM | POA: Diagnosis not present

## 2011-12-18 ENCOUNTER — Other Ambulatory Visit: Payer: Self-pay | Admitting: *Deleted

## 2011-12-18 ENCOUNTER — Telehealth: Payer: Self-pay | Admitting: Physical Medicine & Rehabilitation

## 2011-12-18 MED ORDER — HYDROCODONE-ACETAMINOPHEN 10-325 MG PO TABS: 1.0000 | ORAL_TABLET | Freq: Four times a day (QID) | ORAL | Status: DC | PRN

## 2011-12-18 NOTE — Telephone Encounter (Signed)
Pharmacy says they keep faxing for refill and haven't heard anything.  [place]

## 2011-12-24 DIAGNOSIS — R0602 Shortness of breath: Secondary | ICD-10-CM | POA: Diagnosis not present

## 2011-12-24 DIAGNOSIS — H16209 Unspecified keratoconjunctivitis, unspecified eye: Secondary | ICD-10-CM | POA: Diagnosis not present

## 2011-12-24 DIAGNOSIS — J189 Pneumonia, unspecified organism: Secondary | ICD-10-CM | POA: Diagnosis not present

## 2011-12-30 DIAGNOSIS — B0052 Herpesviral keratitis: Secondary | ICD-10-CM | POA: Diagnosis not present

## 2012-01-01 DIAGNOSIS — J309 Allergic rhinitis, unspecified: Secondary | ICD-10-CM | POA: Diagnosis not present

## 2012-01-01 DIAGNOSIS — R059 Cough, unspecified: Secondary | ICD-10-CM | POA: Diagnosis not present

## 2012-01-01 DIAGNOSIS — R05 Cough: Secondary | ICD-10-CM | POA: Diagnosis not present

## 2012-01-07 ENCOUNTER — Ambulatory Visit: Payer: Self-pay | Admitting: Physician Assistant

## 2012-01-07 DIAGNOSIS — R05 Cough: Secondary | ICD-10-CM | POA: Diagnosis not present

## 2012-01-07 DIAGNOSIS — R059 Cough, unspecified: Secondary | ICD-10-CM | POA: Diagnosis not present

## 2012-01-07 DIAGNOSIS — R0602 Shortness of breath: Secondary | ICD-10-CM | POA: Diagnosis not present

## 2012-01-16 DIAGNOSIS — R0602 Shortness of breath: Secondary | ICD-10-CM | POA: Diagnosis not present

## 2012-01-16 DIAGNOSIS — R21 Rash and other nonspecific skin eruption: Secondary | ICD-10-CM | POA: Diagnosis not present

## 2012-01-16 DIAGNOSIS — R42 Dizziness and giddiness: Secondary | ICD-10-CM | POA: Diagnosis not present

## 2012-01-16 DIAGNOSIS — D1803 Hemangioma of intra-abdominal structures: Secondary | ICD-10-CM | POA: Diagnosis not present

## 2012-01-17 ENCOUNTER — Other Ambulatory Visit: Payer: Self-pay | Admitting: *Deleted

## 2012-01-17 MED ORDER — HYDROCODONE-ACETAMINOPHEN 10-325 MG PO TABS: 1.0000 | ORAL_TABLET | Freq: Four times a day (QID) | ORAL | Status: DC | PRN

## 2012-01-20 DIAGNOSIS — H179 Unspecified corneal scar and opacity: Secondary | ICD-10-CM | POA: Diagnosis not present

## 2012-01-27 DIAGNOSIS — F4001 Agoraphobia with panic disorder: Secondary | ICD-10-CM | POA: Diagnosis not present

## 2012-01-29 DIAGNOSIS — G4710 Hypersomnia, unspecified: Secondary | ICD-10-CM | POA: Diagnosis not present

## 2012-01-29 DIAGNOSIS — J309 Allergic rhinitis, unspecified: Secondary | ICD-10-CM | POA: Diagnosis not present

## 2012-01-29 DIAGNOSIS — J449 Chronic obstructive pulmonary disease, unspecified: Secondary | ICD-10-CM | POA: Diagnosis not present

## 2012-01-29 DIAGNOSIS — R0602 Shortness of breath: Secondary | ICD-10-CM | POA: Diagnosis not present

## 2012-02-04 ENCOUNTER — Encounter: Payer: Self-pay | Admitting: Physical Medicine & Rehabilitation

## 2012-02-04 ENCOUNTER — Encounter: Payer: Medicare Other | Attending: Physical Medicine & Rehabilitation | Admitting: Physical Medicine & Rehabilitation

## 2012-02-04 VITALS — BP 135/77 | HR 88 | Resp 16 | Ht 63.0 in | Wt 241.0 lb

## 2012-02-04 DIAGNOSIS — F329 Major depressive disorder, single episode, unspecified: Secondary | ICD-10-CM

## 2012-02-04 DIAGNOSIS — M47817 Spondylosis without myelopathy or radiculopathy, lumbosacral region: Secondary | ICD-10-CM

## 2012-02-04 DIAGNOSIS — F32A Depression, unspecified: Secondary | ICD-10-CM | POA: Insufficient documentation

## 2012-02-04 DIAGNOSIS — E669 Obesity, unspecified: Secondary | ICD-10-CM

## 2012-02-04 DIAGNOSIS — G894 Chronic pain syndrome: Secondary | ICD-10-CM | POA: Insufficient documentation

## 2012-02-04 DIAGNOSIS — IMO0001 Reserved for inherently not codable concepts without codable children: Secondary | ICD-10-CM | POA: Insufficient documentation

## 2012-02-04 DIAGNOSIS — Z8701 Personal history of pneumonia (recurrent): Secondary | ICD-10-CM | POA: Diagnosis not present

## 2012-02-04 DIAGNOSIS — M47816 Spondylosis without myelopathy or radiculopathy, lumbar region: Secondary | ICD-10-CM

## 2012-02-04 DIAGNOSIS — F341 Dysthymic disorder: Secondary | ICD-10-CM | POA: Insufficient documentation

## 2012-02-04 DIAGNOSIS — M545 Low back pain, unspecified: Secondary | ICD-10-CM | POA: Insufficient documentation

## 2012-02-04 DIAGNOSIS — F411 Generalized anxiety disorder: Secondary | ICD-10-CM

## 2012-02-04 MED ORDER — HYDROCODONE-ACETAMINOPHEN 10-325 MG PO TABS: 1.0000 | ORAL_TABLET | Freq: Four times a day (QID) | ORAL | Status: DC | PRN

## 2012-02-04 NOTE — Progress Notes (Signed)
Subjective:    Patient ID: Karina Greer, female    DOB: [birth year], 46 y.o.   MRN: [id number]  HPI  Karina Greer is back regarding her chronic pain syndrome.  She went to [facility] a few months back for low back pain.  She was told she had degenerative disk disease and another problem which she doesn't remember.  I reviewed the films which showed mild ddd at L5-S1 and facet disease at that level as well as L4-5 which is mild.  They suggested injections, but she really doesn't want to have them.  She was admitted in March for a pneumonia. She had high fevers. A pulmonary referral was made apparently and the pulmonologist is seeing her tomorrow for allergy work up, sleep study, and further work up.    She also developed herpes keratitis and was on a course of abx, etc per optho at [facility]. She still has visual deficits as a result. There is noted scaring on her eye lid as well as the cornea.   Pain Inventory Average Pain 7 Pain Right Now 5 My pain is intermittent, sharp, stabbing, tingling and aching  In the last 24 hours, has pain interfered with the following? General activity 6 Relation with others 7 Enjoyment of life 7 What TIME of day is your pain at its worst? throughout the day Sleep (in general) Fair  Pain is worse with: walking, bending, inactivity, standing and some activites Pain improves with: rest, heat/ice, therapy/exercise and medication Relief from Meds: 5  Mobility walk without assistance how many minutes can you walk? 15 min ability to climb steps?  yes do you drive?  yes Do you have any goals in this area?  yes  Function disabled: date disabled  I need assistance with the following:  meal prep, household duties and shopping Do you have any goals in this area?  yes  Neuro/Psych bladder control problems weakness numbness tremor tingling spasms dizziness confusion depression anxiety suicidal thoughts-no plan, just depends on how she feels  Prior  Studies Any changes since last visit?  no  Physicians involved in your care Any changes since last visit?  no       Review of Systems  Constitutional: Negative.   HENT: Negative.   Eyes: Negative.   Respiratory: Positive for shortness of breath.   Cardiovascular: Negative.   Gastrointestinal: Positive for nausea, abdominal pain and diarrhea.  Genitourinary: Negative.   Musculoskeletal: Negative.   Skin: Negative.   Neurological: Negative.   Hematological: Negative.   Psychiatric/Behavioral: Negative.        Objective:   Physical Exam  Constitutional: She is oriented to person, place, and time. She appears well-developed and well-nourished.       She is morbidly obese.  HENT:  Head: Normocephalic and atraumatic.  Eyes: Conjunctivae and EOM are normal. Pupils are equal, round, and reactive to light.  Neck: Normal range of motion.  Cardiovascular: Normal rate and regular rhythm.   Pulmonary/Chest: Effort normal and breath sounds normal.  Abdominal: Bowel sounds are normal.  Musculoskeletal:       Lumbar back: She exhibits decreased range of motion, tenderness, bony tenderness and pain.       Patient can flex to about 60 degrees with mild pain. She was able to extend to 15 degrees with more severe pain. Facet maneuvers are positive bilaterally producing pain at the L4-S1.  Neurological: She is alert and oriented to person, place, and time. She has normal strength. No cranial nerve deficit or  sensory deficit. She displays a negative Romberg sign.       STM deficits, anxious at times. Fair attention and awareness.           Assessment & Plan:  ASSESSMENT:  1. Fibromyalgia.  2. Anxiety and depression.  3. History of low back pain. Mild facet disease on MRI and exam is consistent 4. Recent pneumonia and herpes keratitis  PLAN:  1. Refilled hydrocodone today 2. Made a referral to PT to address lumbar ROM, exercise, posture, modalities. Aquatic therapy would be good  for her.  3. Continue psychiatry and psychology follow up. Her depression and anxiety continue to play big roles. At least at this point, she is aware that her mood is an important factor and that a lot of her somatic complaints are related to her FMS and not medical "emergencies". 4. Discussed weight loss and appropriate exercise.  She knows that she needs to lose weight. 5. Pulmonary follow up as directed. Agree with the sleep study.

## 2012-02-05 DIAGNOSIS — J309 Allergic rhinitis, unspecified: Secondary | ICD-10-CM | POA: Diagnosis not present

## 2012-02-07 DIAGNOSIS — G4710 Hypersomnia, unspecified: Secondary | ICD-10-CM | POA: Diagnosis not present

## 2012-02-07 DIAGNOSIS — J449 Chronic obstructive pulmonary disease, unspecified: Secondary | ICD-10-CM | POA: Diagnosis not present

## 2012-02-07 DIAGNOSIS — J309 Allergic rhinitis, unspecified: Secondary | ICD-10-CM | POA: Diagnosis not present

## 2012-02-11 DIAGNOSIS — IMO0001 Reserved for inherently not codable concepts without codable children: Secondary | ICD-10-CM | POA: Diagnosis not present

## 2012-02-11 DIAGNOSIS — R109 Unspecified abdominal pain: Secondary | ICD-10-CM | POA: Diagnosis not present

## 2012-02-11 DIAGNOSIS — R5382 Chronic fatigue, unspecified: Secondary | ICD-10-CM | POA: Insufficient documentation

## 2012-02-11 DIAGNOSIS — IMO0002 Reserved for concepts with insufficient information to code with codable children: Secondary | ICD-10-CM | POA: Diagnosis not present

## 2012-02-11 DIAGNOSIS — K589 Irritable bowel syndrome without diarrhea: Secondary | ICD-10-CM | POA: Diagnosis not present

## 2012-02-11 DIAGNOSIS — R300 Dysuria: Secondary | ICD-10-CM | POA: Insufficient documentation

## 2012-02-11 DIAGNOSIS — R112 Nausea with vomiting, unspecified: Secondary | ICD-10-CM | POA: Insufficient documentation

## 2012-02-11 DIAGNOSIS — R351 Nocturia: Secondary | ICD-10-CM | POA: Insufficient documentation

## 2012-02-11 DIAGNOSIS — F329 Major depressive disorder, single episode, unspecified: Secondary | ICD-10-CM | POA: Diagnosis not present

## 2012-02-11 DIAGNOSIS — N393 Stress incontinence (female) (male): Secondary | ICD-10-CM | POA: Insufficient documentation

## 2012-02-11 DIAGNOSIS — N3010 Interstitial cystitis (chronic) without hematuria: Secondary | ICD-10-CM | POA: Insufficient documentation

## 2012-02-11 DIAGNOSIS — N3941 Urge incontinence: Secondary | ICD-10-CM | POA: Insufficient documentation

## 2012-02-11 DIAGNOSIS — K580 Irritable bowel syndrome with diarrhea: Secondary | ICD-10-CM | POA: Insufficient documentation

## 2012-02-11 DIAGNOSIS — M549 Dorsalgia, unspecified: Secondary | ICD-10-CM | POA: Diagnosis not present

## 2012-02-11 DIAGNOSIS — R3915 Urgency of urination: Secondary | ICD-10-CM | POA: Insufficient documentation

## 2012-02-12 DIAGNOSIS — R0602 Shortness of breath: Secondary | ICD-10-CM | POA: Diagnosis not present

## 2012-02-12 DIAGNOSIS — J449 Chronic obstructive pulmonary disease, unspecified: Secondary | ICD-10-CM | POA: Diagnosis not present

## 2012-02-13 DIAGNOSIS — G4710 Hypersomnia, unspecified: Secondary | ICD-10-CM | POA: Diagnosis not present

## 2012-02-13 DIAGNOSIS — G4720 Circadian rhythm sleep disorder, unspecified type: Secondary | ICD-10-CM | POA: Diagnosis not present

## 2012-02-13 DIAGNOSIS — G4730 Sleep apnea, unspecified: Secondary | ICD-10-CM | POA: Diagnosis not present

## 2012-02-13 DIAGNOSIS — F4001 Agoraphobia with panic disorder: Secondary | ICD-10-CM | POA: Diagnosis not present

## 2012-02-19 DIAGNOSIS — R0602 Shortness of breath: Secondary | ICD-10-CM | POA: Diagnosis not present

## 2012-02-19 DIAGNOSIS — J449 Chronic obstructive pulmonary disease, unspecified: Secondary | ICD-10-CM | POA: Diagnosis not present

## 2012-02-24 DIAGNOSIS — G4730 Sleep apnea, unspecified: Secondary | ICD-10-CM | POA: Diagnosis not present

## 2012-02-24 DIAGNOSIS — J449 Chronic obstructive pulmonary disease, unspecified: Secondary | ICD-10-CM | POA: Diagnosis not present

## 2012-02-24 DIAGNOSIS — J309 Allergic rhinitis, unspecified: Secondary | ICD-10-CM | POA: Diagnosis not present

## 2012-02-24 DIAGNOSIS — G4710 Hypersomnia, unspecified: Secondary | ICD-10-CM | POA: Diagnosis not present

## 2012-02-26 DIAGNOSIS — M47817 Spondylosis without myelopathy or radiculopathy, lumbosacral region: Secondary | ICD-10-CM | POA: Diagnosis not present

## 2012-02-26 DIAGNOSIS — M6281 Muscle weakness (generalized): Secondary | ICD-10-CM | POA: Diagnosis not present

## 2012-02-26 DIAGNOSIS — F4001 Agoraphobia with panic disorder: Secondary | ICD-10-CM | POA: Diagnosis not present

## 2012-02-26 DIAGNOSIS — M549 Dorsalgia, unspecified: Secondary | ICD-10-CM | POA: Diagnosis not present

## 2012-03-02 DIAGNOSIS — M47817 Spondylosis without myelopathy or radiculopathy, lumbosacral region: Secondary | ICD-10-CM | POA: Diagnosis not present

## 2012-03-02 DIAGNOSIS — G43009 Migraine without aura, not intractable, without status migrainosus: Secondary | ICD-10-CM | POA: Diagnosis not present

## 2012-03-02 DIAGNOSIS — R21 Rash and other nonspecific skin eruption: Secondary | ICD-10-CM | POA: Diagnosis not present

## 2012-03-02 DIAGNOSIS — M6281 Muscle weakness (generalized): Secondary | ICD-10-CM | POA: Diagnosis not present

## 2012-03-02 DIAGNOSIS — R42 Dizziness and giddiness: Secondary | ICD-10-CM | POA: Diagnosis not present

## 2012-03-02 DIAGNOSIS — M549 Dorsalgia, unspecified: Secondary | ICD-10-CM | POA: Diagnosis not present

## 2012-03-03 DIAGNOSIS — R300 Dysuria: Secondary | ICD-10-CM | POA: Diagnosis not present

## 2012-03-03 DIAGNOSIS — N3010 Interstitial cystitis (chronic) without hematuria: Secondary | ICD-10-CM | POA: Diagnosis not present

## 2012-03-03 DIAGNOSIS — IMO0001 Reserved for inherently not codable concepts without codable children: Secondary | ICD-10-CM | POA: Diagnosis not present

## 2012-03-03 DIAGNOSIS — N3941 Urge incontinence: Secondary | ICD-10-CM | POA: Diagnosis not present

## 2012-03-03 DIAGNOSIS — N289 Disorder of kidney and ureter, unspecified: Secondary | ICD-10-CM | POA: Diagnosis not present

## 2012-03-03 DIAGNOSIS — IMO0002 Reserved for concepts with insufficient information to code with codable children: Secondary | ICD-10-CM | POA: Diagnosis not present

## 2012-03-03 DIAGNOSIS — R109 Unspecified abdominal pain: Secondary | ICD-10-CM | POA: Diagnosis not present

## 2012-03-03 DIAGNOSIS — K589 Irritable bowel syndrome without diarrhea: Secondary | ICD-10-CM | POA: Diagnosis not present

## 2012-03-03 DIAGNOSIS — F329 Major depressive disorder, single episode, unspecified: Secondary | ICD-10-CM | POA: Diagnosis not present

## 2012-03-03 DIAGNOSIS — R5382 Chronic fatigue, unspecified: Secondary | ICD-10-CM | POA: Diagnosis not present

## 2012-03-04 DIAGNOSIS — M47817 Spondylosis without myelopathy or radiculopathy, lumbosacral region: Secondary | ICD-10-CM | POA: Diagnosis not present

## 2012-03-04 DIAGNOSIS — M6281 Muscle weakness (generalized): Secondary | ICD-10-CM | POA: Diagnosis not present

## 2012-03-04 DIAGNOSIS — M549 Dorsalgia, unspecified: Secondary | ICD-10-CM | POA: Diagnosis not present

## 2012-03-06 DIAGNOSIS — G4720 Circadian rhythm sleep disorder, unspecified type: Secondary | ICD-10-CM | POA: Diagnosis not present

## 2012-03-06 DIAGNOSIS — G4730 Sleep apnea, unspecified: Secondary | ICD-10-CM | POA: Diagnosis not present

## 2012-03-06 DIAGNOSIS — G4710 Hypersomnia, unspecified: Secondary | ICD-10-CM | POA: Diagnosis not present

## 2012-03-09 DIAGNOSIS — G43009 Migraine without aura, not intractable, without status migrainosus: Secondary | ICD-10-CM | POA: Diagnosis not present

## 2012-03-09 DIAGNOSIS — R9409 Abnormal results of other function studies of central nervous system: Secondary | ICD-10-CM | POA: Diagnosis not present

## 2012-03-09 DIAGNOSIS — R209 Unspecified disturbances of skin sensation: Secondary | ICD-10-CM | POA: Diagnosis not present

## 2012-03-09 DIAGNOSIS — R51 Headache: Secondary | ICD-10-CM | POA: Diagnosis not present

## 2012-03-11 DIAGNOSIS — R51 Headache: Secondary | ICD-10-CM | POA: Diagnosis not present

## 2012-03-11 DIAGNOSIS — R209 Unspecified disturbances of skin sensation: Secondary | ICD-10-CM | POA: Diagnosis not present

## 2012-03-11 DIAGNOSIS — G43009 Migraine without aura, not intractable, without status migrainosus: Secondary | ICD-10-CM | POA: Diagnosis not present

## 2012-03-11 DIAGNOSIS — L509 Urticaria, unspecified: Secondary | ICD-10-CM | POA: Diagnosis not present

## 2012-03-12 DIAGNOSIS — R269 Unspecified abnormalities of gait and mobility: Secondary | ICD-10-CM | POA: Diagnosis not present

## 2012-03-12 DIAGNOSIS — R42 Dizziness and giddiness: Secondary | ICD-10-CM | POA: Diagnosis not present

## 2012-03-12 DIAGNOSIS — R51 Headache: Secondary | ICD-10-CM | POA: Diagnosis not present

## 2012-03-12 DIAGNOSIS — R209 Unspecified disturbances of skin sensation: Secondary | ICD-10-CM | POA: Diagnosis not present

## 2012-03-13 ENCOUNTER — Other Ambulatory Visit: Payer: Self-pay | Admitting: *Deleted

## 2012-03-13 DIAGNOSIS — M47816 Spondylosis without myelopathy or radiculopathy, lumbar region: Secondary | ICD-10-CM

## 2012-03-13 DIAGNOSIS — IMO0001 Reserved for inherently not codable concepts without codable children: Secondary | ICD-10-CM

## 2012-03-13 DIAGNOSIS — F4001 Agoraphobia with panic disorder: Secondary | ICD-10-CM | POA: Diagnosis not present

## 2012-03-13 MED ORDER — HYDROCODONE-ACETAMINOPHEN 10-325 MG PO TABS: 1.0000 | ORAL_TABLET | Freq: Four times a day (QID) | ORAL | Status: DC | PRN

## 2012-03-14 DIAGNOSIS — L299 Pruritus, unspecified: Secondary | ICD-10-CM | POA: Diagnosis not present

## 2012-03-14 DIAGNOSIS — B354 Tinea corporis: Secondary | ICD-10-CM | POA: Diagnosis not present

## 2012-03-17 ENCOUNTER — Other Ambulatory Visit: Payer: Self-pay | Admitting: Neurology

## 2012-03-17 DIAGNOSIS — R209 Unspecified disturbances of skin sensation: Secondary | ICD-10-CM

## 2012-03-17 DIAGNOSIS — M797 Fibromyalgia: Secondary | ICD-10-CM

## 2012-03-17 DIAGNOSIS — R9089 Other abnormal findings on diagnostic imaging of central nervous system: Secondary | ICD-10-CM

## 2012-03-17 DIAGNOSIS — R519 Headache, unspecified: Secondary | ICD-10-CM

## 2012-03-18 DIAGNOSIS — M47817 Spondylosis without myelopathy or radiculopathy, lumbosacral region: Secondary | ICD-10-CM | POA: Diagnosis not present

## 2012-03-18 DIAGNOSIS — M6281 Muscle weakness (generalized): Secondary | ICD-10-CM | POA: Diagnosis not present

## 2012-03-18 DIAGNOSIS — M549 Dorsalgia, unspecified: Secondary | ICD-10-CM | POA: Diagnosis not present

## 2012-03-23 DIAGNOSIS — J449 Chronic obstructive pulmonary disease, unspecified: Secondary | ICD-10-CM | POA: Diagnosis not present

## 2012-03-23 DIAGNOSIS — J309 Allergic rhinitis, unspecified: Secondary | ICD-10-CM | POA: Diagnosis not present

## 2012-03-23 DIAGNOSIS — G4710 Hypersomnia, unspecified: Secondary | ICD-10-CM | POA: Diagnosis not present

## 2012-03-23 DIAGNOSIS — G4730 Sleep apnea, unspecified: Secondary | ICD-10-CM | POA: Diagnosis not present

## 2012-03-23 DIAGNOSIS — M47817 Spondylosis without myelopathy or radiculopathy, lumbosacral region: Secondary | ICD-10-CM | POA: Diagnosis not present

## 2012-03-23 DIAGNOSIS — M549 Dorsalgia, unspecified: Secondary | ICD-10-CM | POA: Diagnosis not present

## 2012-03-23 DIAGNOSIS — M6281 Muscle weakness (generalized): Secondary | ICD-10-CM | POA: Diagnosis not present

## 2012-03-24 ENCOUNTER — Ambulatory Visit
Admission: RE | Admit: 2012-03-24 | Discharge: 2012-03-24 | Disposition: A | Discharge status: Home / Self Care | Payer: Medicare Other | Source: Ambulatory Visit | Attending: Neurology | Admitting: Neurology

## 2012-03-24 VITALS — BP 127/77 | HR 75

## 2012-03-24 DIAGNOSIS — R930 Abnormal findings on diagnostic imaging of skull and head, not elsewhere classified: Secondary | ICD-10-CM | POA: Diagnosis not present

## 2012-03-24 DIAGNOSIS — R51 Headache: Secondary | ICD-10-CM | POA: Diagnosis not present

## 2012-03-24 DIAGNOSIS — M797 Fibromyalgia: Secondary | ICD-10-CM

## 2012-03-24 DIAGNOSIS — R9089 Other abnormal findings on diagnostic imaging of central nervous system: Secondary | ICD-10-CM

## 2012-03-24 DIAGNOSIS — R209 Unspecified disturbances of skin sensation: Secondary | ICD-10-CM | POA: Diagnosis not present

## 2012-03-24 DIAGNOSIS — R519 Headache, unspecified: Secondary | ICD-10-CM

## 2012-03-24 LAB — CRYPTOCOCCAL ANTIGEN, CSF: Crypto Ag: NEGATIVE

## 2012-03-24 LAB — ANGIOTENSIN CONVERTING ENZYME: Angiotensin-Converting Enzyme: 35 U/L (ref 8–52)

## 2012-03-24 LAB — ALBUMIN: Albumin: 3.4 g/dL — ABNORMAL LOW (ref 3.5–5.2)

## 2012-03-24 NOTE — Progress Notes (Addendum)
Two tubes of blood drawn from right [facility] space without difficulty for serum for procedure.   [id number] pt returned from LP and is in no pain and is resting quietly.  Son is waiting in the car for pt.

## 2012-03-24 NOTE — Discharge Instructions (Signed)

## 2012-03-25 LAB — IGG: IgG (Immunoglobin G), Serum: 902 mg/dL (ref 690–1700)

## 2012-03-27 DIAGNOSIS — J309 Allergic rhinitis, unspecified: Secondary | ICD-10-CM | POA: Diagnosis not present

## 2012-03-27 DIAGNOSIS — F4001 Agoraphobia with panic disorder: Secondary | ICD-10-CM | POA: Diagnosis not present

## 2012-03-30 DIAGNOSIS — F4001 Agoraphobia with panic disorder: Secondary | ICD-10-CM | POA: Diagnosis not present

## 2012-04-01 DIAGNOSIS — G4710 Hypersomnia, unspecified: Secondary | ICD-10-CM | POA: Diagnosis not present

## 2012-04-01 LAB — CSF PANEL II
Glucose, CSF: 74 mg/dL (ref 43–76)
Myelin Basic Protein: <2 mcg/L (ref 0.0–4.0)
RBC Count, CSF: 0 cu mm
Total Protein, CSF: 38 mg/dL (ref 15–45)
Tube #: 1
WBC, CSF: 2 cu mm (ref 0–5)

## 2012-04-03 DIAGNOSIS — R059 Cough, unspecified: Secondary | ICD-10-CM | POA: Diagnosis not present

## 2012-04-03 DIAGNOSIS — J309 Allergic rhinitis, unspecified: Secondary | ICD-10-CM | POA: Diagnosis not present

## 2012-04-03 DIAGNOSIS — R05 Cough: Secondary | ICD-10-CM | POA: Diagnosis not present

## 2012-04-03 DIAGNOSIS — J029 Acute pharyngitis, unspecified: Secondary | ICD-10-CM | POA: Diagnosis not present

## 2012-04-08 DIAGNOSIS — R9409 Abnormal results of other function studies of central nervous system: Secondary | ICD-10-CM | POA: Diagnosis not present

## 2012-04-08 DIAGNOSIS — IMO0001 Reserved for inherently not codable concepts without codable children: Secondary | ICD-10-CM | POA: Diagnosis not present

## 2012-04-08 DIAGNOSIS — R51 Headache: Secondary | ICD-10-CM | POA: Diagnosis not present

## 2012-04-08 DIAGNOSIS — R209 Unspecified disturbances of skin sensation: Secondary | ICD-10-CM | POA: Diagnosis not present

## 2012-04-10 DIAGNOSIS — J309 Allergic rhinitis, unspecified: Secondary | ICD-10-CM | POA: Diagnosis not present

## 2012-04-13 ENCOUNTER — Other Ambulatory Visit: Payer: Self-pay

## 2012-04-13 DIAGNOSIS — IMO0001 Reserved for inherently not codable concepts without codable children: Secondary | ICD-10-CM

## 2012-04-13 DIAGNOSIS — M47816 Spondylosis without myelopathy or radiculopathy, lumbar region: Secondary | ICD-10-CM

## 2012-04-13 MED ORDER — HYDROCODONE-ACETAMINOPHEN 10-325 MG PO TABS: 1.0000 | ORAL_TABLET | Freq: Four times a day (QID) | ORAL | Status: DC

## 2012-04-17 DIAGNOSIS — J309 Allergic rhinitis, unspecified: Secondary | ICD-10-CM | POA: Diagnosis not present

## 2012-04-20 DIAGNOSIS — J449 Chronic obstructive pulmonary disease, unspecified: Secondary | ICD-10-CM | POA: Diagnosis not present

## 2012-04-20 DIAGNOSIS — G4733 Obstructive sleep apnea (adult) (pediatric): Secondary | ICD-10-CM | POA: Diagnosis not present

## 2012-04-22 DIAGNOSIS — F4001 Agoraphobia with panic disorder: Secondary | ICD-10-CM | POA: Diagnosis not present

## 2012-04-24 DIAGNOSIS — J309 Allergic rhinitis, unspecified: Secondary | ICD-10-CM | POA: Diagnosis not present

## 2012-04-30 DIAGNOSIS — J309 Allergic rhinitis, unspecified: Secondary | ICD-10-CM | POA: Diagnosis not present

## 2012-05-01 DIAGNOSIS — F4001 Agoraphobia with panic disorder: Secondary | ICD-10-CM | POA: Diagnosis not present

## 2012-05-07 ENCOUNTER — Emergency Department: Payer: Self-pay | Admitting: Emergency Medicine

## 2012-05-07 DIAGNOSIS — R109 Unspecified abdominal pain: Secondary | ICD-10-CM | POA: Diagnosis not present

## 2012-05-07 DIAGNOSIS — K8020 Calculus of gallbladder without cholecystitis without obstruction: Secondary | ICD-10-CM | POA: Diagnosis not present

## 2012-05-07 DIAGNOSIS — R1012 Left upper quadrant pain: Secondary | ICD-10-CM | POA: Diagnosis not present

## 2012-05-07 DIAGNOSIS — IMO0001 Reserved for inherently not codable concepts without codable children: Secondary | ICD-10-CM | POA: Diagnosis not present

## 2012-05-07 DIAGNOSIS — J309 Allergic rhinitis, unspecified: Secondary | ICD-10-CM | POA: Diagnosis not present

## 2012-05-07 DIAGNOSIS — R1011 Right upper quadrant pain: Secondary | ICD-10-CM | POA: Diagnosis not present

## 2012-05-07 DIAGNOSIS — Z79899 Other long term (current) drug therapy: Secondary | ICD-10-CM | POA: Diagnosis not present

## 2012-05-07 LAB — URINALYSIS, COMPLETE
Bilirubin,UR: NEGATIVE
Glucose,UR: NEGATIVE mg/dL (ref 0–75)
Hyaline Cast: 3
Nitrite: NEGATIVE
Ph: 5 (ref 4.5–8.0)
Protein: 30
RBC,UR: 17 /HPF (ref 0–5)
Specific Gravity: 1.023 (ref 1.003–1.030)
Squamous Epithelial: 64
WBC UR: 28 /HPF (ref 0–5)

## 2012-05-07 LAB — COMPREHENSIVE METABOLIC PANEL
Albumin: 3.2 g/dL — ABNORMAL LOW (ref 3.4–5.0)
Alkaline Phosphatase: 85 U/L (ref 50–136)
Anion Gap: 7 (ref 7–16)
BUN: 6 mg/dL — ABNORMAL LOW (ref 7–18)
Bilirubin,Total: 0.5 mg/dL (ref 0.2–1.0)
Calcium, Total: 8.6 mg/dL (ref 8.5–10.1)
Chloride: 106 mmol/L (ref 98–107)
Co2: 26 mmol/L (ref 21–32)
Creatinine: 0.81 mg/dL (ref 0.60–1.30)
EGFR (African American): >60
EGFR (Non-African Amer.): >60
Glucose: 94 mg/dL (ref 65–99)
Osmolality: 275 (ref 275–301)
Potassium: 3.7 mmol/L (ref 3.5–5.1)
SGOT(AST): 43 U/L — ABNORMAL HIGH (ref 15–37)
SGPT (ALT): 48 U/L (ref 12–78)
Sodium: 139 mmol/L (ref 136–145)
Total Protein: 7.3 g/dL (ref 6.4–8.2)

## 2012-05-07 LAB — CBC
HCT: 43.6 % (ref 35.0–47.0)
HGB: 14.6 g/dL (ref 12.0–16.0)
MCH: 29.4 pg (ref 26.0–34.0)
MCHC: 33.4 g/dL (ref 32.0–36.0)
MCV: 88 fL (ref 80–100)
Platelet: 340 10*3/uL (ref 150–440)
RBC: 4.95 10*6/uL (ref 3.80–5.20)
RDW: 13.9 % (ref 11.5–14.5)
WBC: 14.1 10*3/uL — ABNORMAL HIGH (ref 3.6–11.0)

## 2012-05-07 LAB — PREGNANCY, URINE: Pregnancy Test, Urine: NEGATIVE m[IU]/mL

## 2012-05-07 LAB — LIPASE, BLOOD: Lipase: 101 U/L (ref 73–393)

## 2012-05-08 DIAGNOSIS — K8020 Calculus of gallbladder without cholecystitis without obstruction: Secondary | ICD-10-CM | POA: Diagnosis not present

## 2012-05-08 DIAGNOSIS — R1012 Left upper quadrant pain: Secondary | ICD-10-CM | POA: Diagnosis not present

## 2012-05-11 ENCOUNTER — Encounter: Payer: Medicare Other | Admitting: Physical Medicine & Rehabilitation

## 2012-05-11 DIAGNOSIS — R1012 Left upper quadrant pain: Secondary | ICD-10-CM | POA: Diagnosis not present

## 2012-05-11 DIAGNOSIS — R197 Diarrhea, unspecified: Secondary | ICD-10-CM | POA: Diagnosis not present

## 2012-05-11 DIAGNOSIS — R112 Nausea with vomiting, unspecified: Secondary | ICD-10-CM | POA: Diagnosis not present

## 2012-05-12 ENCOUNTER — Telehealth: Payer: Self-pay | Admitting: Physical Medicine & Rehabilitation

## 2012-05-12 DIAGNOSIS — L909 Atrophic disorder of skin, unspecified: Secondary | ICD-10-CM | POA: Diagnosis not present

## 2012-05-12 DIAGNOSIS — L919 Hypertrophic disorder of the skin, unspecified: Secondary | ICD-10-CM | POA: Diagnosis not present

## 2012-05-12 DIAGNOSIS — L989 Disorder of the skin and subcutaneous tissue, unspecified: Secondary | ICD-10-CM | POA: Diagnosis not present

## 2012-05-12 NOTE — Telephone Encounter (Signed)
WENT TO ED LAST WEEK WITH GALLBLADDER.  SCHEDULED FOR CT ON Thursday .  PLEASE CALL

## 2012-05-12 NOTE — Telephone Encounter (Signed)
Was in ER last week. They sent her to a surgeon for her gallbladder. Surgeon is going to do a CT scan on Thursday with contrast first. He wrote for her 12 percocet to get through til the CT done.  Can Karina Greer fill this?  Her hydrocodone is due to be refilled tomorrow. She wants permission before she fills the rx for Percocet.

## 2012-05-13 ENCOUNTER — Other Ambulatory Visit: Payer: Self-pay | Admitting: *Deleted

## 2012-05-13 DIAGNOSIS — M47816 Spondylosis without myelopathy or radiculopathy, lumbar region: Secondary | ICD-10-CM

## 2012-05-13 DIAGNOSIS — IMO0001 Reserved for inherently not codable concepts without codable children: Secondary | ICD-10-CM

## 2012-05-13 MED ORDER — HYDROCODONE-ACETAMINOPHEN 10-325 MG PO TABS: 1.0000 | ORAL_TABLET | Freq: Four times a day (QID) | ORAL | Status: DC

## 2012-05-14 ENCOUNTER — Ambulatory Visit: Payer: Self-pay | Admitting: Surgery

## 2012-05-14 DIAGNOSIS — J309 Allergic rhinitis, unspecified: Secondary | ICD-10-CM | POA: Diagnosis not present

## 2012-05-14 DIAGNOSIS — N839 Noninflammatory disorder of ovary, fallopian tube and broad ligament, unspecified: Secondary | ICD-10-CM | POA: Diagnosis not present

## 2012-05-14 DIAGNOSIS — N200 Calculus of kidney: Secondary | ICD-10-CM | POA: Diagnosis not present

## 2012-05-14 DIAGNOSIS — R112 Nausea with vomiting, unspecified: Secondary | ICD-10-CM | POA: Diagnosis not present

## 2012-05-14 DIAGNOSIS — R109 Unspecified abdominal pain: Secondary | ICD-10-CM | POA: Diagnosis not present

## 2012-05-15 NOTE — Telephone Encounter (Signed)
She may fill the oxycodone this one time

## 2012-05-18 DIAGNOSIS — F4001 Agoraphobia with panic disorder: Secondary | ICD-10-CM | POA: Diagnosis not present

## 2012-05-18 NOTE — Telephone Encounter (Signed)
Notified Karina Greer that she can fill the Percocet this one time. She says that the surgeon is supposed to contact her today after reviewing her CT scan that was done.

## 2012-05-22 ENCOUNTER — Encounter: Payer: Self-pay | Admitting: Physical Medicine and Rehabilitation

## 2012-05-22 ENCOUNTER — Other Ambulatory Visit: Payer: Self-pay | Admitting: Physical Medicine and Rehabilitation

## 2012-05-22 ENCOUNTER — Encounter
Payer: Medicare Other | Attending: Physical Medicine and Rehabilitation | Admitting: Physical Medicine and Rehabilitation

## 2012-05-22 VITALS — BP 136/81 | HR 70 | Resp 14 | Ht 63.0 in | Wt 234.0 lb

## 2012-05-22 DIAGNOSIS — M47817 Spondylosis without myelopathy or radiculopathy, lumbosacral region: Secondary | ICD-10-CM

## 2012-05-22 DIAGNOSIS — B0052 Herpesviral keratitis: Secondary | ICD-10-CM | POA: Insufficient documentation

## 2012-05-22 DIAGNOSIS — M545 Low back pain, unspecified: Secondary | ICD-10-CM | POA: Diagnosis not present

## 2012-05-22 DIAGNOSIS — R209 Unspecified disturbances of skin sensation: Secondary | ICD-10-CM | POA: Diagnosis not present

## 2012-05-22 DIAGNOSIS — IMO0001 Reserved for inherently not codable concepts without codable children: Secondary | ICD-10-CM | POA: Diagnosis not present

## 2012-05-22 DIAGNOSIS — F329 Major depressive disorder, single episode, unspecified: Secondary | ICD-10-CM | POA: Insufficient documentation

## 2012-05-22 DIAGNOSIS — J309 Allergic rhinitis, unspecified: Secondary | ICD-10-CM | POA: Diagnosis not present

## 2012-05-22 DIAGNOSIS — F411 Generalized anxiety disorder: Secondary | ICD-10-CM | POA: Insufficient documentation

## 2012-05-22 DIAGNOSIS — Z5181 Encounter for therapeutic drug level monitoring: Secondary | ICD-10-CM

## 2012-05-22 DIAGNOSIS — M549 Dorsalgia, unspecified: Secondary | ICD-10-CM | POA: Diagnosis not present

## 2012-05-22 DIAGNOSIS — J189 Pneumonia, unspecified organism: Secondary | ICD-10-CM | POA: Insufficient documentation

## 2012-05-22 DIAGNOSIS — F3289 Other specified depressive episodes: Secondary | ICD-10-CM | POA: Insufficient documentation

## 2012-05-22 DIAGNOSIS — M47816 Spondylosis without myelopathy or radiculopathy, lumbar region: Secondary | ICD-10-CM

## 2012-05-22 NOTE — Progress Notes (Signed)
Subjective:    Patient ID: Karina Greer, female    DOB: 1966/08/03, 46 y.o.   MRN: [id number]  HPI The patient complains about chronic low back pain which radiates into her right posterior hip.  The patient also complains about numbness and tingling in the same distribution. The problem has been stable.  She went to [facility] a few months back for low back pain. She was told she had degenerative disk disease. I reviewed the films which showed mild ddd at L5-S1 and facet disease at that level as well as L4-5 which is mild. They suggested injections, but she really doesn't want to have them. She wants to restart PT.  Pain Inventory Average Pain 7 Pain Right Now 8 My pain is constant, sharp, dull, stabbing, tingling and aching  In the last 24 hours, has pain interfered with the following? General activity 5 Relation with others 5 Enjoyment of life 6 What TIME of day is your pain at its worst? all the time Sleep (in general) Fair  Pain is worse with: walking, bending, sitting, inactivity, standing and some activites Pain improves with: rest, heat/ice, therapy/exercise, pacing activities, medication, TENS and injections Relief from Meds: 5  Mobility how many minutes can you walk? 30 ability to climb steps?  yes do you drive?  yes Do you have any goals in this area?  yes  Function disabled: date disabled  I need assistance with the following:  meal prep, household duties and shopping Do you have any goals in this area?  yes  Neuro/Psych bladder control problems bowel control problems weakness numbness tremor tingling trouble walking spasms dizziness confusion depression anxiety suicidal thoughts  Prior Studies Hospital  Physicians involved in your care Any changes since last visit?  no   Family History  Problem Relation Age of Onset  . Hypertension Mother   . Diabetes Father    History   Social History  . Marital Status: Legally Separated    Spouse Name: N/A    Number of Children: N/A  . Years of Education: N/A   Social History Main Topics  . Smoking status: Never Smoker   . Smokeless tobacco: Never Used  . Alcohol Use: No  . Drug Use: None  . Sexually Active: None   Other Topics Concern  . None   Social History Narrative  . None   History reviewed. No pertinent past surgical history. Past Medical History  Diagnosis Date  . Fibromyalgia   . Depression   . Anxiety   . Abnormal gait   . Thoracolumbar back pain    BP 136/81  Pulse 70  Resp 14  Ht 5\' 3"  (1.6 m)  Wt 234 lb (106.142 kg)  BMI 41.45 kg/m2  SpO2 96%     Review of Systems  HENT: Positive for neck pain.   Gastrointestinal: Positive for nausea, vomiting, diarrhea and constipation.  Musculoskeletal: Positive for myalgias, back pain, arthralgias and gait problem.  Neurological: Positive for dizziness, tremors, weakness and numbness.  Psychiatric/Behavioral: Positive for suicidal ideas, confusion and dysphoric mood. The patient is nervous/anxious.   All other systems reviewed and are negative.       Objective:   Physical Exam  Constitutional: She is oriented to person, place, and time. She appears well-developed and well-nourished.       Morbidly obese  HENT:  Head: Normocephalic.  Neck: Neck supple.  Musculoskeletal: She exhibits tenderness.  Neurological: She is alert and oriented to person, place, and time.  Skin: Skin is  warm and dry.  Psychiatric: She has a normal mood and affect.    Symmetric normal motor tone is noted throughout. Normal muscle bulk. Muscle testing reveals 5/5 muscle strength of the upper extremity, and 5/5 of the lower extremity. Full range of motion in upper and lower extremities. ROM of spine is restricted. Fine motor movements are normal in both hands. Sensory is intact and symmetric to light touch, pinprick and proprioception. DTR in the upper and lower extremity are present and symmetric 2+. No clonus is noted.  Patient arises  from chair without difficulty. Narrow based gait with normal arm swing bilateral , able to walk on heels and toes . Tandem walk is stable. No pronator drift. Rhomberg negative.       Assessment & Plan:  1. Fibromyalgia.  2. Anxiety and depression.  3. History of low back pain. Mild facet disease on MRI and exam is consistent  4.  pneumonia and herpes keratitis  PLAN:  1. Refilled hydrocodone today  2. Referred her to PT, she interrupted her treatments, because of increased headaches. Prescribed Aquatic therapy, which would also help her to loose weight.  3. Continue psychiatry and psychology follow up. Her depression and anxiety continue to play big roles. At least at this point, she is aware that her mood is an important factor and that a lot of her somatic complaints are related to her FMS and not medical "emergencies".  4. Discussed weight loss and appropriate exercise. She knows that she needs to lose weight.  Gave her some suggestions about healthy nutrition. 5. Pulmonary follow up as directed. Did have a  sleep study, is on C-PAP now.

## 2012-05-22 NOTE — Patient Instructions (Signed)
Continue with a walking program , restart PT in the pool

## 2012-05-28 DIAGNOSIS — F4001 Agoraphobia with panic disorder: Secondary | ICD-10-CM | POA: Diagnosis not present

## 2012-05-29 DIAGNOSIS — J309 Allergic rhinitis, unspecified: Secondary | ICD-10-CM | POA: Diagnosis not present

## 2012-06-01 DIAGNOSIS — R197 Diarrhea, unspecified: Secondary | ICD-10-CM | POA: Diagnosis not present

## 2012-06-01 DIAGNOSIS — R933 Abnormal findings on diagnostic imaging of other parts of digestive tract: Secondary | ICD-10-CM | POA: Diagnosis not present

## 2012-06-01 DIAGNOSIS — R1310 Dysphagia, unspecified: Secondary | ICD-10-CM | POA: Diagnosis not present

## 2012-06-01 DIAGNOSIS — K625 Hemorrhage of anus and rectum: Secondary | ICD-10-CM | POA: Diagnosis not present

## 2012-06-01 DIAGNOSIS — R1012 Left upper quadrant pain: Secondary | ICD-10-CM | POA: Diagnosis not present

## 2012-06-02 ENCOUNTER — Ambulatory Visit: Payer: Self-pay | Admitting: Unknown Physician Specialty

## 2012-06-02 DIAGNOSIS — N9489 Other specified conditions associated with female genital organs and menstrual cycle: Secondary | ICD-10-CM | POA: Diagnosis not present

## 2012-06-02 DIAGNOSIS — R1012 Left upper quadrant pain: Secondary | ICD-10-CM | POA: Diagnosis not present

## 2012-06-02 DIAGNOSIS — R933 Abnormal findings on diagnostic imaging of other parts of digestive tract: Secondary | ICD-10-CM | POA: Diagnosis not present

## 2012-06-03 DIAGNOSIS — N390 Urinary tract infection, site not specified: Secondary | ICD-10-CM | POA: Diagnosis not present

## 2012-06-04 DIAGNOSIS — F4001 Agoraphobia with panic disorder: Secondary | ICD-10-CM | POA: Diagnosis not present

## 2012-06-05 DIAGNOSIS — J309 Allergic rhinitis, unspecified: Secondary | ICD-10-CM | POA: Diagnosis not present

## 2012-06-11 ENCOUNTER — Other Ambulatory Visit: Payer: Self-pay

## 2012-06-11 DIAGNOSIS — IMO0001 Reserved for inherently not codable concepts without codable children: Secondary | ICD-10-CM

## 2012-06-11 DIAGNOSIS — IMO0002 Reserved for concepts with insufficient information to code with codable children: Secondary | ICD-10-CM | POA: Diagnosis not present

## 2012-06-11 DIAGNOSIS — R262 Difficulty in walking, not elsewhere classified: Secondary | ICD-10-CM | POA: Diagnosis not present

## 2012-06-11 DIAGNOSIS — M47816 Spondylosis without myelopathy or radiculopathy, lumbar region: Secondary | ICD-10-CM

## 2012-06-11 MED ORDER — HYDROCODONE-ACETAMINOPHEN 10-325 MG PO TABS: 1.0000 | ORAL_TABLET | Freq: Four times a day (QID) | ORAL | Status: DC

## 2012-06-12 DIAGNOSIS — I10 Essential (primary) hypertension: Secondary | ICD-10-CM | POA: Diagnosis not present

## 2012-06-12 DIAGNOSIS — R079 Chest pain, unspecified: Secondary | ICD-10-CM | POA: Diagnosis not present

## 2012-06-12 DIAGNOSIS — J309 Allergic rhinitis, unspecified: Secondary | ICD-10-CM | POA: Diagnosis not present

## 2012-06-15 DIAGNOSIS — R262 Difficulty in walking, not elsewhere classified: Secondary | ICD-10-CM | POA: Diagnosis not present

## 2012-06-15 DIAGNOSIS — IMO0002 Reserved for concepts with insufficient information to code with codable children: Secondary | ICD-10-CM | POA: Diagnosis not present

## 2012-06-17 DIAGNOSIS — R262 Difficulty in walking, not elsewhere classified: Secondary | ICD-10-CM | POA: Diagnosis not present

## 2012-06-17 DIAGNOSIS — IMO0002 Reserved for concepts with insufficient information to code with codable children: Secondary | ICD-10-CM | POA: Diagnosis not present

## 2012-06-18 DIAGNOSIS — J449 Chronic obstructive pulmonary disease, unspecified: Secondary | ICD-10-CM | POA: Diagnosis not present

## 2012-06-18 DIAGNOSIS — J309 Allergic rhinitis, unspecified: Secondary | ICD-10-CM | POA: Diagnosis not present

## 2012-06-18 DIAGNOSIS — G4710 Hypersomnia, unspecified: Secondary | ICD-10-CM | POA: Diagnosis not present

## 2012-06-18 DIAGNOSIS — G4720 Circadian rhythm sleep disorder, unspecified type: Secondary | ICD-10-CM | POA: Diagnosis not present

## 2012-06-19 DIAGNOSIS — IMO0002 Reserved for concepts with insufficient information to code with codable children: Secondary | ICD-10-CM | POA: Diagnosis not present

## 2012-06-19 DIAGNOSIS — R262 Difficulty in walking, not elsewhere classified: Secondary | ICD-10-CM | POA: Diagnosis not present

## 2012-06-22 DIAGNOSIS — R262 Difficulty in walking, not elsewhere classified: Secondary | ICD-10-CM | POA: Diagnosis not present

## 2012-06-22 DIAGNOSIS — IMO0002 Reserved for concepts with insufficient information to code with codable children: Secondary | ICD-10-CM | POA: Diagnosis not present

## 2012-06-23 DIAGNOSIS — R262 Difficulty in walking, not elsewhere classified: Secondary | ICD-10-CM | POA: Diagnosis not present

## 2012-06-23 DIAGNOSIS — IMO0002 Reserved for concepts with insufficient information to code with codable children: Secondary | ICD-10-CM | POA: Diagnosis not present

## 2012-06-23 DIAGNOSIS — R1012 Left upper quadrant pain: Secondary | ICD-10-CM | POA: Diagnosis not present

## 2012-06-23 DIAGNOSIS — R109 Unspecified abdominal pain: Secondary | ICD-10-CM | POA: Diagnosis not present

## 2012-06-23 DIAGNOSIS — K625 Hemorrhage of anus and rectum: Secondary | ICD-10-CM | POA: Diagnosis not present

## 2012-06-24 DIAGNOSIS — N925 Other specified irregular menstruation: Secondary | ICD-10-CM | POA: Diagnosis not present

## 2012-06-24 DIAGNOSIS — N938 Other specified abnormal uterine and vaginal bleeding: Secondary | ICD-10-CM | POA: Diagnosis not present

## 2012-06-24 DIAGNOSIS — N949 Unspecified condition associated with female genital organs and menstrual cycle: Secondary | ICD-10-CM | POA: Diagnosis not present

## 2012-06-24 DIAGNOSIS — R109 Unspecified abdominal pain: Secondary | ICD-10-CM | POA: Diagnosis not present

## 2012-06-25 DIAGNOSIS — J309 Allergic rhinitis, unspecified: Secondary | ICD-10-CM | POA: Diagnosis not present

## 2012-07-01 DIAGNOSIS — R209 Unspecified disturbances of skin sensation: Secondary | ICD-10-CM | POA: Insufficient documentation

## 2012-07-01 DIAGNOSIS — R51 Headache: Secondary | ICD-10-CM | POA: Diagnosis not present

## 2012-07-01 DIAGNOSIS — R9409 Abnormal results of other function studies of central nervous system: Secondary | ICD-10-CM | POA: Insufficient documentation

## 2012-07-01 DIAGNOSIS — IMO0001 Reserved for inherently not codable concepts without codable children: Secondary | ICD-10-CM | POA: Diagnosis not present

## 2012-07-01 DIAGNOSIS — G43009 Migraine without aura, not intractable, without status migrainosus: Secondary | ICD-10-CM | POA: Insufficient documentation

## 2012-07-03 DIAGNOSIS — Z13220 Encounter for screening for lipoid disorders: Secondary | ICD-10-CM | POA: Diagnosis not present

## 2012-07-03 DIAGNOSIS — J309 Allergic rhinitis, unspecified: Secondary | ICD-10-CM | POA: Diagnosis not present

## 2012-07-03 DIAGNOSIS — R7401 Elevation of levels of liver transaminase levels: Secondary | ICD-10-CM | POA: Diagnosis not present

## 2012-07-03 DIAGNOSIS — Z23 Encounter for immunization: Secondary | ICD-10-CM | POA: Diagnosis not present

## 2012-07-03 DIAGNOSIS — I10 Essential (primary) hypertension: Secondary | ICD-10-CM | POA: Diagnosis not present

## 2012-07-06 ENCOUNTER — Ambulatory Visit: Payer: Self-pay | Admitting: Obstetrics and Gynecology

## 2012-07-06 DIAGNOSIS — Z01812 Encounter for preprocedural laboratory examination: Secondary | ICD-10-CM | POA: Diagnosis not present

## 2012-07-06 DIAGNOSIS — N950 Postmenopausal bleeding: Secondary | ICD-10-CM | POA: Diagnosis not present

## 2012-07-06 LAB — URINALYSIS, COMPLETE
Bacteria: NONE SEEN
Bilirubin,UR: NEGATIVE
Glucose,UR: NEGATIVE mg/dL (ref 0–75)
Ketone: NEGATIVE
Nitrite: NEGATIVE
Ph: 9 (ref 4.5–8.0)
Protein: NEGATIVE
RBC,UR: 1806 /HPF (ref 0–5)
Specific Gravity: 1.012 (ref 1.003–1.030)
Squamous Epithelial: 6
WBC UR: 7 /HPF (ref 0–5)

## 2012-07-06 LAB — HEMOGLOBIN: HGB: 15 g/dL (ref 12.0–16.0)

## 2012-07-07 ENCOUNTER — Other Ambulatory Visit: Payer: Self-pay | Admitting: Physical Medicine & Rehabilitation

## 2012-07-10 DIAGNOSIS — J309 Allergic rhinitis, unspecified: Secondary | ICD-10-CM | POA: Diagnosis not present

## 2012-07-10 DIAGNOSIS — Z23 Encounter for immunization: Secondary | ICD-10-CM | POA: Diagnosis not present

## 2012-07-10 DIAGNOSIS — B3731 Acute candidiasis of vulva and vagina: Secondary | ICD-10-CM | POA: Diagnosis not present

## 2012-07-10 DIAGNOSIS — B373 Candidiasis of vulva and vagina: Secondary | ICD-10-CM | POA: Diagnosis not present

## 2012-07-10 DIAGNOSIS — R21 Rash and other nonspecific skin eruption: Secondary | ICD-10-CM | POA: Diagnosis not present

## 2012-07-10 DIAGNOSIS — J45909 Unspecified asthma, uncomplicated: Secondary | ICD-10-CM | POA: Diagnosis not present

## 2012-07-13 ENCOUNTER — Ambulatory Visit: Payer: Self-pay | Admitting: Obstetrics and Gynecology

## 2012-07-13 DIAGNOSIS — N3010 Interstitial cystitis (chronic) without hematuria: Secondary | ICD-10-CM | POA: Diagnosis not present

## 2012-07-13 DIAGNOSIS — N924 Excessive bleeding in the premenopausal period: Secondary | ICD-10-CM | POA: Diagnosis not present

## 2012-07-13 DIAGNOSIS — G4730 Sleep apnea, unspecified: Secondary | ICD-10-CM | POA: Diagnosis not present

## 2012-07-13 DIAGNOSIS — F411 Generalized anxiety disorder: Secondary | ICD-10-CM | POA: Diagnosis not present

## 2012-07-13 DIAGNOSIS — N841 Polyp of cervix uteri: Secondary | ICD-10-CM | POA: Diagnosis not present

## 2012-07-13 DIAGNOSIS — I10 Essential (primary) hypertension: Secondary | ICD-10-CM | POA: Diagnosis not present

## 2012-07-13 DIAGNOSIS — N926 Irregular menstruation, unspecified: Secondary | ICD-10-CM | POA: Diagnosis not present

## 2012-07-13 DIAGNOSIS — Z79899 Other long term (current) drug therapy: Secondary | ICD-10-CM | POA: Diagnosis not present

## 2012-07-13 DIAGNOSIS — F329 Major depressive disorder, single episode, unspecified: Secondary | ICD-10-CM | POA: Diagnosis not present

## 2012-07-13 DIAGNOSIS — K589 Irritable bowel syndrome without diarrhea: Secondary | ICD-10-CM | POA: Diagnosis not present

## 2012-07-13 DIAGNOSIS — M545 Low back pain, unspecified: Secondary | ICD-10-CM | POA: Diagnosis not present

## 2012-07-13 DIAGNOSIS — R42 Dizziness and giddiness: Secondary | ICD-10-CM | POA: Diagnosis not present

## 2012-07-13 DIAGNOSIS — J45909 Unspecified asthma, uncomplicated: Secondary | ICD-10-CM | POA: Diagnosis not present

## 2012-07-13 DIAGNOSIS — G43909 Migraine, unspecified, not intractable, without status migrainosus: Secondary | ICD-10-CM | POA: Diagnosis not present

## 2012-07-13 DIAGNOSIS — M1990 Unspecified osteoarthritis, unspecified site: Secondary | ICD-10-CM | POA: Diagnosis not present

## 2012-07-13 DIAGNOSIS — N920 Excessive and frequent menstruation with regular cycle: Secondary | ICD-10-CM | POA: Diagnosis not present

## 2012-07-15 LAB — PATHOLOGY REPORT

## 2012-07-17 DIAGNOSIS — J309 Allergic rhinitis, unspecified: Secondary | ICD-10-CM | POA: Diagnosis not present

## 2012-07-24 DIAGNOSIS — J309 Allergic rhinitis, unspecified: Secondary | ICD-10-CM | POA: Diagnosis not present

## 2012-07-27 ENCOUNTER — Ambulatory Visit: Payer: Self-pay | Admitting: Unknown Physician Specialty

## 2012-07-27 DIAGNOSIS — D126 Benign neoplasm of colon, unspecified: Secondary | ICD-10-CM | POA: Diagnosis not present

## 2012-07-27 DIAGNOSIS — E669 Obesity, unspecified: Secondary | ICD-10-CM | POA: Diagnosis not present

## 2012-07-27 DIAGNOSIS — Z87442 Personal history of urinary calculi: Secondary | ICD-10-CM | POA: Diagnosis not present

## 2012-07-27 DIAGNOSIS — IMO0001 Reserved for inherently not codable concepts without codable children: Secondary | ICD-10-CM | POA: Diagnosis not present

## 2012-07-27 DIAGNOSIS — K625 Hemorrhage of anus and rectum: Secondary | ICD-10-CM | POA: Diagnosis not present

## 2012-07-27 DIAGNOSIS — M1990 Unspecified osteoarthritis, unspecified site: Secondary | ICD-10-CM | POA: Diagnosis not present

## 2012-07-27 DIAGNOSIS — N3020 Other chronic cystitis without hematuria: Secondary | ICD-10-CM | POA: Diagnosis not present

## 2012-07-27 DIAGNOSIS — R1310 Dysphagia, unspecified: Secondary | ICD-10-CM | POA: Diagnosis not present

## 2012-07-27 DIAGNOSIS — R1032 Left lower quadrant pain: Secondary | ICD-10-CM | POA: Diagnosis not present

## 2012-07-27 DIAGNOSIS — K2970 Gastritis, unspecified, without bleeding: Secondary | ICD-10-CM | POA: Diagnosis not present

## 2012-07-27 DIAGNOSIS — D131 Benign neoplasm of stomach: Secondary | ICD-10-CM | POA: Diagnosis not present

## 2012-07-27 DIAGNOSIS — Z79899 Other long term (current) drug therapy: Secondary | ICD-10-CM | POA: Diagnosis not present

## 2012-07-27 DIAGNOSIS — Z888 Allergy status to other drugs, medicaments and biological substances status: Secondary | ICD-10-CM | POA: Diagnosis not present

## 2012-07-27 DIAGNOSIS — K2990 Gastroduodenitis, unspecified, without bleeding: Secondary | ICD-10-CM | POA: Diagnosis not present

## 2012-07-27 DIAGNOSIS — K219 Gastro-esophageal reflux disease without esophagitis: Secondary | ICD-10-CM | POA: Diagnosis not present

## 2012-07-27 DIAGNOSIS — F411 Generalized anxiety disorder: Secondary | ICD-10-CM | POA: Diagnosis not present

## 2012-07-27 DIAGNOSIS — R112 Nausea with vomiting, unspecified: Secondary | ICD-10-CM | POA: Diagnosis not present

## 2012-07-27 DIAGNOSIS — K648 Other hemorrhoids: Secondary | ICD-10-CM | POA: Diagnosis not present

## 2012-07-27 DIAGNOSIS — J45909 Unspecified asthma, uncomplicated: Secondary | ICD-10-CM | POA: Diagnosis not present

## 2012-07-28 LAB — PATHOLOGY REPORT

## 2012-07-29 DIAGNOSIS — L719 Rosacea, unspecified: Secondary | ICD-10-CM | POA: Diagnosis not present

## 2012-07-31 DIAGNOSIS — J309 Allergic rhinitis, unspecified: Secondary | ICD-10-CM | POA: Diagnosis not present

## 2012-08-03 DIAGNOSIS — R109 Unspecified abdominal pain: Secondary | ICD-10-CM | POA: Diagnosis not present

## 2012-08-03 DIAGNOSIS — K7689 Other specified diseases of liver: Secondary | ICD-10-CM | POA: Diagnosis not present

## 2012-08-03 DIAGNOSIS — Z23 Encounter for immunization: Secondary | ICD-10-CM | POA: Diagnosis not present

## 2012-08-03 DIAGNOSIS — I10 Essential (primary) hypertension: Secondary | ICD-10-CM | POA: Diagnosis not present

## 2012-08-03 DIAGNOSIS — R1012 Left upper quadrant pain: Secondary | ICD-10-CM | POA: Diagnosis not present

## 2012-08-03 DIAGNOSIS — G43009 Migraine without aura, not intractable, without status migrainosus: Secondary | ICD-10-CM | POA: Diagnosis not present

## 2012-08-03 DIAGNOSIS — K8020 Calculus of gallbladder without cholecystitis without obstruction: Secondary | ICD-10-CM | POA: Diagnosis not present

## 2012-08-05 DIAGNOSIS — F4001 Agoraphobia with panic disorder: Secondary | ICD-10-CM | POA: Diagnosis not present

## 2012-08-07 ENCOUNTER — Other Ambulatory Visit: Payer: Self-pay | Admitting: Physical Medicine & Rehabilitation

## 2012-08-07 DIAGNOSIS — J309 Allergic rhinitis, unspecified: Secondary | ICD-10-CM | POA: Diagnosis not present

## 2012-08-10 ENCOUNTER — Ambulatory Visit: Payer: Self-pay | Admitting: Oncology

## 2012-08-10 DIAGNOSIS — D72829 Elevated white blood cell count, unspecified: Secondary | ICD-10-CM | POA: Diagnosis not present

## 2012-08-10 DIAGNOSIS — F4001 Agoraphobia with panic disorder: Secondary | ICD-10-CM | POA: Diagnosis not present

## 2012-08-10 DIAGNOSIS — Z79899 Other long term (current) drug therapy: Secondary | ICD-10-CM | POA: Diagnosis not present

## 2012-08-10 LAB — CBC CANCER CENTER
Basophil #: 0.1 x10 3/mm (ref 0.0–0.1)
Basophil %: 1.3 %
Eosinophil #: 0.1 x10 3/mm (ref 0.0–0.7)
Eosinophil %: 1.3 %
HCT: 45.3 % (ref 35.0–47.0)
HGB: 14.5 g/dL (ref 12.0–16.0)
Lymphocyte #: 2.4 x10 3/mm (ref 1.0–3.6)
Lymphocyte %: 21 %
MCH: 28.2 pg (ref 26.0–34.0)
MCHC: 32 g/dL (ref 32.0–36.0)
MCV: 88 fL (ref 80–100)
Monocyte #: 0.6 x10 3/mm (ref 0.2–0.9)
Monocyte %: 5.4 %
Neutrophil #: 8 x10 3/mm — ABNORMAL HIGH (ref 1.4–6.5)
Neutrophil %: 71 %
Platelet: 356 x10 3/mm (ref 150–440)
RBC: 5.14 10*6/uL (ref 3.80–5.20)
RDW: 13.3 % (ref 11.5–14.5)
WBC: 11.2 x10 3/mm — ABNORMAL HIGH (ref 3.6–11.0)

## 2012-08-13 ENCOUNTER — Encounter: Payer: Self-pay | Admitting: Physical Medicine and Rehabilitation

## 2012-08-13 ENCOUNTER — Encounter
Payer: Medicare Other | Attending: Physical Medicine and Rehabilitation | Admitting: Physical Medicine and Rehabilitation

## 2012-08-13 VITALS — BP 118/68 | HR 81 | Resp 14 | Ht 63.0 in | Wt 230.0 lb

## 2012-08-13 DIAGNOSIS — J189 Pneumonia, unspecified organism: Secondary | ICD-10-CM | POA: Diagnosis not present

## 2012-08-13 DIAGNOSIS — M5137 Other intervertebral disc degeneration, lumbosacral region: Secondary | ICD-10-CM | POA: Diagnosis not present

## 2012-08-13 DIAGNOSIS — R209 Unspecified disturbances of skin sensation: Secondary | ICD-10-CM | POA: Insufficient documentation

## 2012-08-13 DIAGNOSIS — B0052 Herpesviral keratitis: Secondary | ICD-10-CM | POA: Diagnosis not present

## 2012-08-13 DIAGNOSIS — F329 Major depressive disorder, single episode, unspecified: Secondary | ICD-10-CM | POA: Diagnosis not present

## 2012-08-13 DIAGNOSIS — M47817 Spondylosis without myelopathy or radiculopathy, lumbosacral region: Secondary | ICD-10-CM | POA: Diagnosis not present

## 2012-08-13 DIAGNOSIS — IMO0001 Reserved for inherently not codable concepts without codable children: Secondary | ICD-10-CM | POA: Insufficient documentation

## 2012-08-13 DIAGNOSIS — M545 Low back pain, unspecified: Secondary | ICD-10-CM | POA: Insufficient documentation

## 2012-08-13 DIAGNOSIS — F3289 Other specified depressive episodes: Secondary | ICD-10-CM | POA: Insufficient documentation

## 2012-08-13 DIAGNOSIS — J069 Acute upper respiratory infection, unspecified: Secondary | ICD-10-CM | POA: Diagnosis not present

## 2012-08-13 DIAGNOSIS — M51379 Other intervertebral disc degeneration, lumbosacral region without mention of lumbar back pain or lower extremity pain: Secondary | ICD-10-CM | POA: Insufficient documentation

## 2012-08-13 DIAGNOSIS — M26609 Unspecified temporomandibular joint disorder, unspecified side: Secondary | ICD-10-CM | POA: Diagnosis not present

## 2012-08-13 DIAGNOSIS — E669 Obesity, unspecified: Secondary | ICD-10-CM | POA: Diagnosis not present

## 2012-08-13 DIAGNOSIS — F411 Generalized anxiety disorder: Secondary | ICD-10-CM | POA: Diagnosis not present

## 2012-08-13 DIAGNOSIS — M47816 Spondylosis without myelopathy or radiculopathy, lumbar region: Secondary | ICD-10-CM

## 2012-08-13 NOTE — Progress Notes (Signed)
Subjective:    Patient ID: Karina Greer, female    DOB: 05-30-1966, 46 y.o.   MRN: [id number]  HPI The patient complains about chronic low back pain which radiates into her right posterior hip. The patient also complains about numbness and tingling in the same distribution.  The problem has improved. She reports, that she is doing aquatic PT and PT on land regularly, and she is enjoying this. She states, that her mood is much better, and she is in less pain.  She went to [facility] a few months back for low back pain. She was told she had degenerative disk disease. I reviewed the films which showed mild ddd at L5-S1 and facet disease at that level as well as L4-5 which is mild. They suggested injections, but she really doesn't want to have them.   Pain Inventory Average Pain 7 Pain Right Now 9 My pain is constant, burning and aching  In the last 24 hours, has pain interfered with the following? General activity 7 Relation with others 5 Enjoyment of life 5 What TIME of day is your pain at its worst? varies Sleep (in general) Fair  Pain is worse with: walking, bending, sitting, standing and some activites Pain improves with: rest, therapy/exercise, pacing activities and medication Relief from Meds: 5  Mobility walk without assistance how many minutes can you walk? 20 ability to climb steps?  yes do you drive?  yes Do you have any goals in this area?  yes  Function disabled: date disabled 2005  Neuro/Psych weakness tingling trouble walking spasms confusion  Prior Studies Any changes since last visit?  no  Physicians involved in your care Any changes since last visit?  no   Family History  Problem Relation Age of Onset  . Hypertension Mother   . Diabetes Father    History   Social History  . Marital Status: Legally Separated    Spouse Name: N/A    Number of Children: N/A  . Years of Education: N/A   Social History Main Topics  . Smoking status: Never Smoker   .  Smokeless tobacco: Never Used  . Alcohol Use: No  . Drug Use: None  . Sexually Active: None   Other Topics Concern  . None   Social History Narrative  . None   No past surgical history on file. Past Medical History  Diagnosis Date  . Fibromyalgia   . Depression   . Anxiety   . Abnormal gait   . Thoracolumbar back pain    BP 118/68  Pulse 81  Resp 14  Ht 5\' 3"  (1.6 m)  Wt 230 lb (104.327 kg)  BMI 40.74 kg/m2  SpO2 97%     Review of Systems  Musculoskeletal: Positive for myalgias, arthralgias and gait problem.  Neurological: Positive for weakness.  Psychiatric/Behavioral: Positive for confusion.  All other systems reviewed and are negative.       Objective:   Physical Exam Constitutional: She is oriented to person, place, and time. She appears well-developed and well-nourished.  Morbidly obese  HENT:  Head: Normocephalic.  Neck: Neck supple.  Musculoskeletal: She exhibits tenderness.  Neurological: She is alert and oriented to person, place, and time.  Skin: Skin is warm and dry.  Psychiatric: She has a normal mood and affect.   Symmetric normal motor tone is noted throughout. Normal muscle bulk. Muscle testing reveals 5/5 muscle strength of the upper extremity, and 5/5 of the lower extremity. Full range of motion in upper and  lower extremities. ROM of spine is mildly restricted. Fine motor movements are normal in both hands.  Sensory is intact and symmetric to light touch, pinprick and proprioception.  DTR in the upper and lower extremity are present and symmetric 2+. No clonus is noted.  Patient arises from chair without difficulty. Narrow based gait with normal arm swing bilateral , able to walk on heels and toes . Tandem walk is stable. No pronator drift. Rhomberg negative.        Assessment & Plan:  1. Fibromyalgia.  2. Anxiety and depression.  3. History of low back pain. Mild facet disease on MRI and exam is consistent  4. pneumonia and herpes  keratitis  PLAN:  1. Patient should continue PT, which has helped her a lot with her mood and pain. Ordered a mobile TENS unit, which has helped her in the PT office with decreasing her pain.  3. Continue psychiatry and psychology follow up. Her depression and anxiety have improved with her exercising. 4. Discussed weight loss and appropriate exercise and nutrition. She knows that she needs to lose weight. Gave her some suggestions about healthy nutrition.  5. Pulmonary follow up as directed. Did have a sleep study, is on C-PAP now. Follow up in 3 month.

## 2012-08-13 NOTE — Patient Instructions (Addendum)
Continue with the exercises in the water, the gym and at home.

## 2012-08-14 ENCOUNTER — Encounter: Payer: Medicare Other | Admitting: Physical Medicine and Rehabilitation

## 2012-08-14 DIAGNOSIS — J309 Allergic rhinitis, unspecified: Secondary | ICD-10-CM | POA: Diagnosis not present

## 2012-08-21 DIAGNOSIS — J309 Allergic rhinitis, unspecified: Secondary | ICD-10-CM | POA: Diagnosis not present

## 2012-08-23 ENCOUNTER — Ambulatory Visit: Payer: Self-pay | Admitting: Oncology

## 2012-08-23 DIAGNOSIS — M549 Dorsalgia, unspecified: Secondary | ICD-10-CM | POA: Diagnosis not present

## 2012-08-23 DIAGNOSIS — R262 Difficulty in walking, not elsewhere classified: Secondary | ICD-10-CM | POA: Diagnosis not present

## 2012-08-23 DIAGNOSIS — IMO0002 Reserved for concepts with insufficient information to code with codable children: Secondary | ICD-10-CM | POA: Diagnosis not present

## 2012-08-24 ENCOUNTER — Telehealth: Payer: Self-pay

## 2012-08-24 DIAGNOSIS — K8020 Calculus of gallbladder without cholecystitis without obstruction: Secondary | ICD-10-CM | POA: Diagnosis not present

## 2012-08-24 DIAGNOSIS — R52 Pain, unspecified: Secondary | ICD-10-CM

## 2012-08-24 DIAGNOSIS — K648 Other hemorrhoids: Secondary | ICD-10-CM | POA: Diagnosis not present

## 2012-08-24 NOTE — Telephone Encounter (Signed)
Patient needs her aquatic therapy order faxed to [phone].

## 2012-08-25 NOTE — Telephone Encounter (Signed)
Aquatic therapy order ordered.  Please fax to [facility].

## 2012-08-28 DIAGNOSIS — J309 Allergic rhinitis, unspecified: Secondary | ICD-10-CM | POA: Diagnosis not present

## 2012-08-31 DIAGNOSIS — R233 Spontaneous ecchymoses: Secondary | ICD-10-CM | POA: Diagnosis not present

## 2012-09-01 DIAGNOSIS — G43009 Migraine without aura, not intractable, without status migrainosus: Secondary | ICD-10-CM | POA: Diagnosis not present

## 2012-09-01 DIAGNOSIS — I10 Essential (primary) hypertension: Secondary | ICD-10-CM | POA: Diagnosis not present

## 2012-09-01 DIAGNOSIS — R233 Spontaneous ecchymoses: Secondary | ICD-10-CM | POA: Diagnosis not present

## 2012-09-01 DIAGNOSIS — K7689 Other specified diseases of liver: Secondary | ICD-10-CM | POA: Diagnosis not present

## 2012-09-01 DIAGNOSIS — Z23 Encounter for immunization: Secondary | ICD-10-CM | POA: Diagnosis not present

## 2012-09-01 DIAGNOSIS — E7800 Pure hypercholesterolemia, unspecified: Secondary | ICD-10-CM | POA: Diagnosis not present

## 2012-09-03 ENCOUNTER — Other Ambulatory Visit: Payer: Self-pay | Admitting: Physical Medicine & Rehabilitation

## 2012-09-04 DIAGNOSIS — J309 Allergic rhinitis, unspecified: Secondary | ICD-10-CM | POA: Diagnosis not present

## 2012-09-07 DIAGNOSIS — K648 Other hemorrhoids: Secondary | ICD-10-CM | POA: Diagnosis not present

## 2012-09-09 DIAGNOSIS — R51 Headache: Secondary | ICD-10-CM | POA: Diagnosis not present

## 2012-09-09 DIAGNOSIS — J449 Chronic obstructive pulmonary disease, unspecified: Secondary | ICD-10-CM | POA: Diagnosis not present

## 2012-09-09 DIAGNOSIS — L719 Rosacea, unspecified: Secondary | ICD-10-CM | POA: Diagnosis not present

## 2012-09-09 DIAGNOSIS — G4710 Hypersomnia, unspecified: Secondary | ICD-10-CM | POA: Diagnosis not present

## 2012-09-09 DIAGNOSIS — J309 Allergic rhinitis, unspecified: Secondary | ICD-10-CM | POA: Diagnosis not present

## 2012-09-09 DIAGNOSIS — G4730 Sleep apnea, unspecified: Secondary | ICD-10-CM | POA: Diagnosis not present

## 2012-09-09 DIAGNOSIS — I781 Nevus, non-neoplastic: Secondary | ICD-10-CM | POA: Diagnosis not present

## 2012-09-18 DIAGNOSIS — J309 Allergic rhinitis, unspecified: Secondary | ICD-10-CM | POA: Diagnosis not present

## 2012-09-21 DIAGNOSIS — E7800 Pure hypercholesterolemia, unspecified: Secondary | ICD-10-CM | POA: Diagnosis not present

## 2012-09-21 DIAGNOSIS — R5383 Other fatigue: Secondary | ICD-10-CM | POA: Diagnosis not present

## 2012-09-21 DIAGNOSIS — R5381 Other malaise: Secondary | ICD-10-CM | POA: Diagnosis not present

## 2012-09-21 DIAGNOSIS — Z0000 Encounter for general adult medical examination without abnormal findings: Secondary | ICD-10-CM | POA: Diagnosis not present

## 2012-09-21 DIAGNOSIS — J020 Streptococcal pharyngitis: Secondary | ICD-10-CM | POA: Diagnosis not present

## 2012-09-21 DIAGNOSIS — H60399 Other infective otitis externa, unspecified ear: Secondary | ICD-10-CM | POA: Diagnosis not present

## 2012-09-21 DIAGNOSIS — Z1389 Encounter for screening for other disorder: Secondary | ICD-10-CM | POA: Diagnosis not present

## 2012-09-21 DIAGNOSIS — Z130 Encounter for screening for diseases of the blood and blood-forming organs and certain disorders involving the immune mechanism: Secondary | ICD-10-CM | POA: Diagnosis not present

## 2012-09-21 DIAGNOSIS — E559 Vitamin D deficiency, unspecified: Secondary | ICD-10-CM | POA: Diagnosis not present

## 2012-09-21 DIAGNOSIS — Z01419 Encounter for gynecological examination (general) (routine) without abnormal findings: Secondary | ICD-10-CM | POA: Diagnosis not present

## 2012-09-21 DIAGNOSIS — Z139 Encounter for screening, unspecified: Secondary | ICD-10-CM | POA: Diagnosis not present

## 2012-09-23 HISTORY — PX: OTHER SURGICAL HISTORY: SHX169

## 2012-09-23 HISTORY — PX: CHOLECYSTECTOMY: SHX55

## 2012-09-24 DIAGNOSIS — J209 Acute bronchitis, unspecified: Secondary | ICD-10-CM | POA: Diagnosis not present

## 2012-09-28 DIAGNOSIS — J309 Allergic rhinitis, unspecified: Secondary | ICD-10-CM | POA: Diagnosis not present

## 2012-09-29 DIAGNOSIS — J209 Acute bronchitis, unspecified: Secondary | ICD-10-CM | POA: Diagnosis not present

## 2012-09-29 DIAGNOSIS — R21 Rash and other nonspecific skin eruption: Secondary | ICD-10-CM | POA: Diagnosis not present

## 2012-10-01 ENCOUNTER — Other Ambulatory Visit: Payer: Self-pay | Admitting: Physical Medicine & Rehabilitation

## 2012-10-06 DIAGNOSIS — K589 Irritable bowel syndrome without diarrhea: Secondary | ICD-10-CM | POA: Diagnosis not present

## 2012-10-08 DIAGNOSIS — G4730 Sleep apnea, unspecified: Secondary | ICD-10-CM | POA: Diagnosis not present

## 2012-10-08 DIAGNOSIS — R9409 Abnormal results of other function studies of central nervous system: Secondary | ICD-10-CM | POA: Diagnosis not present

## 2012-10-08 DIAGNOSIS — R51 Headache: Secondary | ICD-10-CM | POA: Diagnosis not present

## 2012-10-08 DIAGNOSIS — G4710 Hypersomnia, unspecified: Secondary | ICD-10-CM | POA: Diagnosis not present

## 2012-10-08 DIAGNOSIS — G4720 Circadian rhythm sleep disorder, unspecified type: Secondary | ICD-10-CM | POA: Diagnosis not present

## 2012-10-09 DIAGNOSIS — F4001 Agoraphobia with panic disorder: Secondary | ICD-10-CM | POA: Diagnosis not present

## 2012-10-09 DIAGNOSIS — J309 Allergic rhinitis, unspecified: Secondary | ICD-10-CM | POA: Diagnosis not present

## 2012-10-13 DIAGNOSIS — G4720 Circadian rhythm sleep disorder, unspecified type: Secondary | ICD-10-CM | POA: Diagnosis not present

## 2012-10-13 DIAGNOSIS — G4730 Sleep apnea, unspecified: Secondary | ICD-10-CM | POA: Diagnosis not present

## 2012-10-13 DIAGNOSIS — G4710 Hypersomnia, unspecified: Secondary | ICD-10-CM | POA: Diagnosis not present

## 2012-10-16 DIAGNOSIS — J309 Allergic rhinitis, unspecified: Secondary | ICD-10-CM | POA: Diagnosis not present

## 2012-10-23 DIAGNOSIS — J309 Allergic rhinitis, unspecified: Secondary | ICD-10-CM | POA: Diagnosis not present

## 2012-10-26 DIAGNOSIS — R1012 Left upper quadrant pain: Secondary | ICD-10-CM | POA: Diagnosis not present

## 2012-10-28 DIAGNOSIS — I10 Essential (primary) hypertension: Secondary | ICD-10-CM | POA: Diagnosis not present

## 2012-10-28 DIAGNOSIS — J029 Acute pharyngitis, unspecified: Secondary | ICD-10-CM | POA: Diagnosis not present

## 2012-10-28 DIAGNOSIS — G43009 Migraine without aura, not intractable, without status migrainosus: Secondary | ICD-10-CM | POA: Diagnosis not present

## 2012-10-28 DIAGNOSIS — E7800 Pure hypercholesterolemia, unspecified: Secondary | ICD-10-CM | POA: Diagnosis not present

## 2012-10-29 ENCOUNTER — Ambulatory Visit: Payer: Self-pay | Admitting: Anesthesiology

## 2012-10-29 DIAGNOSIS — K8010 Calculus of gallbladder with chronic cholecystitis without obstruction: Secondary | ICD-10-CM | POA: Diagnosis not present

## 2012-10-29 DIAGNOSIS — Z01812 Encounter for preprocedural laboratory examination: Secondary | ICD-10-CM | POA: Diagnosis not present

## 2012-10-29 DIAGNOSIS — I10 Essential (primary) hypertension: Secondary | ICD-10-CM | POA: Diagnosis not present

## 2012-10-29 DIAGNOSIS — Z01810 Encounter for preprocedural cardiovascular examination: Secondary | ICD-10-CM | POA: Diagnosis not present

## 2012-10-29 LAB — CBC
HCT: 42.5 % (ref 35.0–47.0)
HGB: 14.2 g/dL (ref 12.0–16.0)
MCH: 29.1 pg (ref 26.0–34.0)
MCHC: 33.4 g/dL (ref 32.0–36.0)
MCV: 87 fL (ref 80–100)
Platelet: 330 10*3/uL (ref 150–440)
RBC: 4.88 10*6/uL (ref 3.80–5.20)
RDW: 14 % (ref 11.5–14.5)
WBC: 11 10*3/uL (ref 3.6–11.0)

## 2012-10-29 LAB — COMPREHENSIVE METABOLIC PANEL
Albumin: 3.1 g/dL — ABNORMAL LOW (ref 3.4–5.0)
Alkaline Phosphatase: 100 U/L (ref 50–136)
Anion Gap: 5 — ABNORMAL LOW (ref 7–16)
BUN: 8 mg/dL (ref 7–18)
Bilirubin,Total: 0.3 mg/dL (ref 0.2–1.0)
Calcium, Total: 8.8 mg/dL (ref 8.5–10.1)
Chloride: 105 mmol/L (ref 98–107)
Co2: 29 mmol/L (ref 21–32)
Creatinine: 0.73 mg/dL (ref 0.60–1.30)
EGFR (African American): >60
EGFR (Non-African Amer.): >60
Glucose: 88 mg/dL (ref 65–99)
Osmolality: 275 (ref 275–301)
Potassium: 3.7 mmol/L (ref 3.5–5.1)
SGOT(AST): 19 U/L (ref 15–37)
SGPT (ALT): 23 U/L (ref 12–78)
Sodium: 139 mmol/L (ref 136–145)
Total Protein: 7.2 g/dL (ref 6.4–8.2)

## 2012-10-30 DIAGNOSIS — J309 Allergic rhinitis, unspecified: Secondary | ICD-10-CM | POA: Diagnosis not present

## 2012-11-02 ENCOUNTER — Telehealth: Payer: Self-pay

## 2012-11-02 ENCOUNTER — Ambulatory Visit: Payer: Self-pay | Admitting: Surgery

## 2012-11-02 DIAGNOSIS — Z885 Allergy status to narcotic agent status: Secondary | ICD-10-CM | POA: Diagnosis not present

## 2012-11-02 DIAGNOSIS — Z79899 Other long term (current) drug therapy: Secondary | ICD-10-CM | POA: Diagnosis not present

## 2012-11-02 DIAGNOSIS — M1990 Unspecified osteoarthritis, unspecified site: Secondary | ICD-10-CM | POA: Diagnosis not present

## 2012-11-02 DIAGNOSIS — K219 Gastro-esophageal reflux disease without esophagitis: Secondary | ICD-10-CM | POA: Diagnosis not present

## 2012-11-02 DIAGNOSIS — Z8249 Family history of ischemic heart disease and other diseases of the circulatory system: Secondary | ICD-10-CM | POA: Diagnosis not present

## 2012-11-02 DIAGNOSIS — I10 Essential (primary) hypertension: Secondary | ICD-10-CM | POA: Diagnosis not present

## 2012-11-02 DIAGNOSIS — N3010 Interstitial cystitis (chronic) without hematuria: Secondary | ICD-10-CM | POA: Diagnosis not present

## 2012-11-02 DIAGNOSIS — M810 Age-related osteoporosis without current pathological fracture: Secondary | ICD-10-CM | POA: Diagnosis not present

## 2012-11-02 DIAGNOSIS — Z888 Allergy status to other drugs, medicaments and biological substances status: Secondary | ICD-10-CM | POA: Diagnosis not present

## 2012-11-02 DIAGNOSIS — G4730 Sleep apnea, unspecified: Secondary | ICD-10-CM | POA: Diagnosis not present

## 2012-11-02 DIAGNOSIS — IMO0001 Reserved for inherently not codable concepts without codable children: Secondary | ICD-10-CM | POA: Diagnosis not present

## 2012-11-02 DIAGNOSIS — J45909 Unspecified asthma, uncomplicated: Secondary | ICD-10-CM | POA: Diagnosis not present

## 2012-11-02 DIAGNOSIS — K8020 Calculus of gallbladder without cholecystitis without obstruction: Secondary | ICD-10-CM | POA: Diagnosis not present

## 2012-11-02 DIAGNOSIS — Z833 Family history of diabetes mellitus: Secondary | ICD-10-CM | POA: Diagnosis not present

## 2012-11-02 DIAGNOSIS — E669 Obesity, unspecified: Secondary | ICD-10-CM | POA: Diagnosis not present

## 2012-11-02 DIAGNOSIS — K589 Irritable bowel syndrome without diarrhea: Secondary | ICD-10-CM | POA: Diagnosis not present

## 2012-11-02 DIAGNOSIS — K8010 Calculus of gallbladder with chronic cholecystitis without obstruction: Secondary | ICD-10-CM | POA: Diagnosis not present

## 2012-11-02 DIAGNOSIS — Z823 Family history of stroke: Secondary | ICD-10-CM | POA: Diagnosis not present

## 2012-11-02 DIAGNOSIS — G43909 Migraine, unspecified, not intractable, without status migrainosus: Secondary | ICD-10-CM | POA: Diagnosis not present

## 2012-11-02 DIAGNOSIS — M545 Low back pain, unspecified: Secondary | ICD-10-CM | POA: Diagnosis not present

## 2012-11-02 NOTE — Telephone Encounter (Signed)
Left message telling her the surgeon should prescribe until he has released her from his care.

## 2012-11-02 NOTE — Telephone Encounter (Signed)
Patient is going to have gallbladder surgery today and will be out of her norco.  She was wondering if she can get a refill or how long the surgeon can prescribe.  It is ok to leave message with her son.

## 2012-11-03 ENCOUNTER — Other Ambulatory Visit: Payer: Self-pay | Admitting: Physical Medicine & Rehabilitation

## 2012-11-03 MED ORDER — HYDROCODONE-ACETAMINOPHEN 10-325 MG PO TABS: ORAL_TABLET | ORAL | Status: DC

## 2012-11-04 LAB — PATHOLOGY REPORT

## 2012-11-09 ENCOUNTER — Ambulatory Visit: Payer: Self-pay | Admitting: Oncology

## 2012-11-10 ENCOUNTER — Encounter
Payer: Medicare Other | Attending: Physical Medicine and Rehabilitation | Admitting: Physical Medicine and Rehabilitation

## 2012-11-10 ENCOUNTER — Encounter: Payer: Self-pay | Admitting: Physical Medicine and Rehabilitation

## 2012-11-10 VITALS — BP 125/90 | HR 86 | Resp 14 | Ht 63.0 in | Wt 230.4 lb

## 2012-11-10 DIAGNOSIS — F411 Generalized anxiety disorder: Secondary | ICD-10-CM | POA: Diagnosis not present

## 2012-11-10 DIAGNOSIS — J189 Pneumonia, unspecified organism: Secondary | ICD-10-CM | POA: Diagnosis not present

## 2012-11-10 DIAGNOSIS — Z9089 Acquired absence of other organs: Secondary | ICD-10-CM | POA: Diagnosis not present

## 2012-11-10 DIAGNOSIS — M545 Low back pain, unspecified: Secondary | ICD-10-CM | POA: Diagnosis not present

## 2012-11-10 DIAGNOSIS — IMO0001 Reserved for inherently not codable concepts without codable children: Secondary | ICD-10-CM | POA: Diagnosis not present

## 2012-11-10 DIAGNOSIS — F329 Major depressive disorder, single episode, unspecified: Secondary | ICD-10-CM | POA: Insufficient documentation

## 2012-11-10 DIAGNOSIS — G8929 Other chronic pain: Secondary | ICD-10-CM | POA: Diagnosis not present

## 2012-11-10 DIAGNOSIS — F3289 Other specified depressive episodes: Secondary | ICD-10-CM | POA: Insufficient documentation

## 2012-11-10 DIAGNOSIS — B0052 Herpesviral keratitis: Secondary | ICD-10-CM | POA: Insufficient documentation

## 2012-11-10 DIAGNOSIS — M47817 Spondylosis without myelopathy or radiculopathy, lumbosacral region: Secondary | ICD-10-CM | POA: Diagnosis not present

## 2012-11-10 NOTE — Patient Instructions (Signed)
Continue with aquatic exercises as tolerated.

## 2012-11-10 NOTE — Progress Notes (Signed)
Subjective:    Patient ID: Karina Greer, female    DOB: 02/07/1966, 47 y.o.   MRN: [id number]  HPI The patient complains about chronic low back pain which radiates into her right posterior hip. The patient also complains about numbness and tingling in the same distribution.  The problem has improved. She reports, that she has stopped doing aquatic PT and PT on land, because she had a gall bladder removal on 2/10/214, which still causes her some pain. She went to [facility] a few months back for low back pain. She was told she had degenerative disk disease. I reviewed the films which showed mild ddd at L5-S1 and facet disease at that level as well as L4-5 which is mild. They suggested injections, but she really doesn't want to have them.   Pain Inventory Average Pain 8 Pain Right Now 10 My pain is sharp, stabbing and aching  In the last 24 hours, has pain interfered with the following? General activity 10 Relation with others 10 Enjoyment of life 10 What TIME of day is your pain at its worst? all Sleep (in general) Poor  Pain is worse with: standing and some activites Pain improves with: rest, heat/ice, therapy/exercise, pacing activities and medication Relief from Meds: 5  Mobility ability to climb steps?  yes do you drive?  yes  Function disabled: date disabled see chart I need assistance with the following:  meal prep, household duties and shopping  Neuro/Psych bowel control problems weakness tingling trouble walking spasms dizziness confusion depression anxiety suicidal thoughts sees psychiatrist and psychologist  Prior Studies Any changes since last visit?  no  Physicians involved in your care Any changes since last visit?  no   Family History  Problem Relation Age of Onset  . Hypertension Mother   . Diabetes Father    History   Social History  . Marital Status: Legally Separated    Spouse Name: N/A    Number of Children: N/A  . Years of Education: N/A    Social History Main Topics  . Smoking status: Never Smoker   . Smokeless tobacco: Never Used  . Alcohol Use: No  . Drug Use: None  . Sexually Active: None   Other Topics Concern  . None   Social History Narrative  . None   Past Surgical History  Procedure Laterality Date  . Cholecystectomy     Past Medical History  Diagnosis Date  . Fibromyalgia   . Depression   . Anxiety   . Abnormal gait   . Thoracolumbar back pain    BP 125/90  Pulse 86  Resp 14  Ht 5\' 3"  (1.6 m)  Wt 230 lb 6.4 oz (104.509 kg)  BMI 40.82 kg/m2  SpO2 96%    Review of Systems  Constitutional: Positive for appetite change and unexpected weight change.  Respiratory: Positive for apnea, cough, shortness of breath and wheezing.   Gastrointestinal: Positive for nausea and abdominal pain.  Genitourinary:       Bladder control problems  Musculoskeletal: Positive for gait problem.       Spasms  Neurological: Positive for weakness.       Tingling  Psychiatric/Behavioral: Positive for suicidal ideas, confusion and dysphoric mood. The patient is nervous/anxious.   All other systems reviewed and are negative.       Objective:   Physical Exam Constitutional: She is oriented to person, place, and time. She appears well-developed and well-nourished.  Morbidly obese  HENT:  Head: Normocephalic.  Neck:  Neck supple.  Musculoskeletal: She exhibits tenderness.  Neurological: She is alert and oriented to person, place, and time.  Skin: Skin is warm and dry. Redness , increased temperature, pain around at least one of her insicions, worse at RUQ. Psychiatric: She has a normal mood and affect.  Symmetric normal motor tone is noted throughout. Normal muscle bulk. Muscle testing reveals 5/5 muscle strength of the upper extremity, and 5/5 of the lower extremity. Full range of motion in upper and lower extremities. ROM of spine is mildly restricted. Fine motor movements are normal in both hands.  Sensory is  intact and symmetric to light touch, pinprick and proprioception.  DTR in the upper and lower extremity are present and symmetric 2+. No clonus is noted.  Patient arises from chair without difficulty. Narrow based gait with normal arm swing bilateral , able to walk on heels and toes . Tandem walk is stable. No pronator drift. Rhomberg negative.        Assessment & Plan:  1. Fibromyalgia.  2. Anxiety and depression.  3. History of low back pain. Mild facet disease on MRI and exam is consistent  4. pneumonia and herpes keratitis  5. S/p gallbladder removal on 2/10/ 2014, advised patient to follow up with her surgeon for redness, increased temperature and pain on palpation around at least one of her incisions. She states, that she will call him today. PLAN:  1. Patient should continue PT, which has helped her a lot with her mood and pain. Ordered a mobile TENS unit, which has helped her in the PT office with decreasing her pain.  3. Continue psychiatry and psychology follow up. Her depression and anxiety have improved with her exercising.  4. Discussed weight loss and appropriate exercise and nutrition. She knows that she needs to lose weight. Gave her some suggestions about healthy nutrition.  5. Pulmonary follow up as directed. Did have a sleep study, is on C-PAP now.  Follow up in 3 month.

## 2012-11-11 DIAGNOSIS — J309 Allergic rhinitis, unspecified: Secondary | ICD-10-CM | POA: Diagnosis not present

## 2012-11-24 ENCOUNTER — Encounter: Payer: Medicare Other | Admitting: Physical Medicine and Rehabilitation

## 2012-11-24 DIAGNOSIS — J309 Allergic rhinitis, unspecified: Secondary | ICD-10-CM | POA: Diagnosis not present

## 2012-11-25 ENCOUNTER — Encounter
Payer: Medicare Other | Attending: Physical Medicine and Rehabilitation | Admitting: Physical Medicine and Rehabilitation

## 2012-11-25 ENCOUNTER — Encounter: Payer: Self-pay | Admitting: Physical Medicine and Rehabilitation

## 2012-11-25 VITALS — BP 124/65 | HR 85 | Resp 17 | Ht 63.0 in | Wt 226.4 lb

## 2012-11-25 DIAGNOSIS — M545 Low back pain, unspecified: Secondary | ICD-10-CM | POA: Insufficient documentation

## 2012-11-25 DIAGNOSIS — M47817 Spondylosis without myelopathy or radiculopathy, lumbosacral region: Secondary | ICD-10-CM

## 2012-11-25 DIAGNOSIS — R209 Unspecified disturbances of skin sensation: Secondary | ICD-10-CM | POA: Insufficient documentation

## 2012-11-25 DIAGNOSIS — IMO0001 Reserved for inherently not codable concepts without codable children: Secondary | ICD-10-CM

## 2012-11-25 DIAGNOSIS — F411 Generalized anxiety disorder: Secondary | ICD-10-CM | POA: Diagnosis not present

## 2012-11-25 DIAGNOSIS — Z9089 Acquired absence of other organs: Secondary | ICD-10-CM | POA: Insufficient documentation

## 2012-11-25 DIAGNOSIS — Z6841 Body Mass Index (BMI) 40.0 and over, adult: Secondary | ICD-10-CM | POA: Diagnosis not present

## 2012-11-25 DIAGNOSIS — F329 Major depressive disorder, single episode, unspecified: Secondary | ICD-10-CM | POA: Insufficient documentation

## 2012-11-25 DIAGNOSIS — F3289 Other specified depressive episodes: Secondary | ICD-10-CM | POA: Insufficient documentation

## 2012-11-25 NOTE — Patient Instructions (Signed)
Continue with PT and your TENS unit

## 2012-11-25 NOTE — Progress Notes (Signed)
Subjective:    Patient ID: Karina Greer, female    DOB: 1966-05-19, 47 y.o.   MRN: [id number]  HPI The patient complains about chronic low back pain which radiates into her right posterior hip. The patient also complains about numbness and tingling in the same distribution.  The problem has improved. She reports, that she has stopped doing aquatic PT and PT on land, because she had a gall bladder removal on 2/10/214, but she would like to restart now, this was also approved by her surgeon. She went to [facility] a few months back for low back pain. She was told she had degenerative disk disease. I reviewed the films which showed mild ddd at L5-S1 and facet disease at that level as well as L4-5 which is mild. They suggested injections, but she really doesn't want to have them.   Pain Inventory Average Pain 7 Pain Right Now 5 My pain is intermittent, sharp, burning, dull, stabbing, tingling and aching  In the last 24 hours, has pain interfered with the following? General activity 4 Relation with others 5 Enjoyment of life 5 What TIME of day is your pain at its worst? morning, day time, evening, night time Sleep (in general) Fair  Pain is worse with: walking, bending, sitting, inactivity, standing and some activites Pain improves with: rest, heat/ice, therapy/exercise, pacing activities, medication, TENS and injections Relief from Meds: not answered  Mobility how many minutes can you walk? 20 ability to climb steps?  yes do you drive?  yes Do you have any goals in this area?  yes  Function disabled: date disabled 2006 Do you have any goals in this area?  yes  Neuro/Psych spasms depression anxiety  Prior Studies Any changes since last visit?  no  Physicians involved in your care Primary care Dr. Anna Genre   Family History  Problem Relation Age of Onset  . Hypertension Mother   . Diabetes Father    History   Social History  . Marital Status: Legally Separated    Spouse Name:  N/A    Number of Children: N/A  . Years of Education: N/A   Social History Main Topics  . Smoking status: Never Smoker   . Smokeless tobacco: Never Used  . Alcohol Use: No  . Drug Use: None  . Sexually Active: None   Other Topics Concern  . None   Social History Narrative  . None   Past Surgical History  Procedure Laterality Date  . Cholecystectomy     Past Medical History  Diagnosis Date  . Fibromyalgia   . Depression   . Anxiety   . Abnormal gait   . Thoracolumbar back pain    BP 124/65  Pulse 85  Resp 17  Ht 5\' 3"  (1.6 m)  Wt 226 lb 6.4 oz (102.694 kg)  BMI 40.11 kg/m2  SpO2 97%  LMP 11/20/2012      Review of Systems  Constitutional: Positive for appetite change and unexpected weight change.  Respiratory: Positive for wheezing.   Psychiatric/Behavioral: Positive for sleep disturbance.       Cpap  All other systems reviewed and are negative.       Objective:   Physical Exam Constitutional: She is oriented to person, place, and time. She appears well-developed and well-nourished.  Morbidly obese  HENT:  Head: Normocephalic.  Neck: Neck supple.  Musculoskeletal: She exhibits tenderness.  Neurological: She is alert and oriented to person, place, and time.  Skin: Skin is warm and dry. Redness ,  increased temperature, pain around at least one of her insicions, worse at RUQ. Psychiatric: She has a normal mood and affect.  Symmetric normal motor tone is noted throughout. Normal muscle bulk. Muscle testing reveals 5/5 muscle strength of the upper extremity, and 5/5 of the lower extremity. Full range of motion in upper and lower extremities. ROM of spine is mildly restricted. Fine motor movements are normal in both hands.  Sensory is intact and symmetric to light touch, pinprick and proprioception.  DTR in the upper and lower extremity are present and symmetric 2+. No clonus is noted.  Patient arises from chair without difficulty. Narrow based gait with  normal arm swing bilateral , able to walk on heels and toes . Tandem walk is stable. No pronator drift. Rhomberg negative.        Assessment & Plan:  1. Fibromyalgia.  2. Anxiety and depression.  3. History of low back pain. Mild facet disease on MRI and exam is consistent  4. pneumonia and herpes keratitis  5. S/p gallbladder removal on 2/10/ 2014, advised patient to follow up with her surgeon for redness, increased temperature and pain on palpation around at least one of her incisions. She states, that she will call him today.  PLAN:  1. Patient should continue PT, which has helped her a lot with her mood and pain, her surgeon approved of restarting pt, wrote a new order for aquatic and land PT. Ordered a mobile TENS unit, which has helped her in the PT office with decreasing her pain. She now has a TENS unit. She is using the TENS unit 4 times or more per week, for about 60 min, and it is giving her about 50% relief. 3. Continue psychiatry and psychology follow up. Her depression and anxiety have improved with her exercising.  4. Discussed weight loss and appropriate exercise and nutrition. She knows that she needs to lose weight. Gave her some suggestions about healthy nutrition.  5. Pulmonary follow up as directed. Did have a sleep study, is on C-PAP now.  Follow up in 3 month.

## 2012-11-29 ENCOUNTER — Other Ambulatory Visit: Payer: Self-pay | Admitting: Physical Medicine & Rehabilitation

## 2012-12-01 DIAGNOSIS — R42 Dizziness and giddiness: Secondary | ICD-10-CM | POA: Diagnosis not present

## 2012-12-01 DIAGNOSIS — S0993XA Unspecified injury of face, initial encounter: Secondary | ICD-10-CM | POA: Diagnosis not present

## 2012-12-01 DIAGNOSIS — H5710 Ocular pain, unspecified eye: Secondary | ICD-10-CM | POA: Diagnosis not present

## 2012-12-01 DIAGNOSIS — S0990XA Unspecified injury of head, initial encounter: Secondary | ICD-10-CM | POA: Diagnosis not present

## 2012-12-04 DIAGNOSIS — R110 Nausea: Secondary | ICD-10-CM | POA: Diagnosis not present

## 2012-12-04 DIAGNOSIS — R42 Dizziness and giddiness: Secondary | ICD-10-CM | POA: Diagnosis not present

## 2012-12-04 DIAGNOSIS — J309 Allergic rhinitis, unspecified: Secondary | ICD-10-CM | POA: Diagnosis not present

## 2012-12-04 DIAGNOSIS — Z87828 Personal history of other (healed) physical injury and trauma: Secondary | ICD-10-CM | POA: Diagnosis not present

## 2012-12-04 DIAGNOSIS — S0990XA Unspecified injury of head, initial encounter: Secondary | ICD-10-CM | POA: Diagnosis not present

## 2012-12-04 DIAGNOSIS — F4001 Agoraphobia with panic disorder: Secondary | ICD-10-CM | POA: Diagnosis not present

## 2012-12-07 DIAGNOSIS — F4001 Agoraphobia with panic disorder: Secondary | ICD-10-CM | POA: Diagnosis not present

## 2012-12-09 DIAGNOSIS — G44309 Post-traumatic headache, unspecified, not intractable: Secondary | ICD-10-CM | POA: Diagnosis not present

## 2012-12-14 DIAGNOSIS — J309 Allergic rhinitis, unspecified: Secondary | ICD-10-CM | POA: Diagnosis not present

## 2012-12-17 DIAGNOSIS — L719 Rosacea, unspecified: Secondary | ICD-10-CM | POA: Diagnosis not present

## 2012-12-18 DIAGNOSIS — J309 Allergic rhinitis, unspecified: Secondary | ICD-10-CM | POA: Diagnosis not present

## 2012-12-25 DIAGNOSIS — J309 Allergic rhinitis, unspecified: Secondary | ICD-10-CM | POA: Diagnosis not present

## 2012-12-28 DIAGNOSIS — R109 Unspecified abdominal pain: Secondary | ICD-10-CM | POA: Diagnosis not present

## 2012-12-28 DIAGNOSIS — J0110 Acute frontal sinusitis, unspecified: Secondary | ICD-10-CM | POA: Diagnosis not present

## 2012-12-28 DIAGNOSIS — K1370 Unspecified lesions of oral mucosa: Secondary | ICD-10-CM | POA: Diagnosis not present

## 2012-12-28 DIAGNOSIS — R51 Headache: Secondary | ICD-10-CM | POA: Diagnosis not present

## 2012-12-28 DIAGNOSIS — J019 Acute sinusitis, unspecified: Secondary | ICD-10-CM | POA: Diagnosis not present

## 2012-12-28 DIAGNOSIS — J029 Acute pharyngitis, unspecified: Secondary | ICD-10-CM | POA: Diagnosis not present

## 2012-12-28 DIAGNOSIS — R599 Enlarged lymph nodes, unspecified: Secondary | ICD-10-CM | POA: Diagnosis not present

## 2012-12-31 ENCOUNTER — Other Ambulatory Visit: Payer: Self-pay | Admitting: Physical Medicine & Rehabilitation

## 2013-01-01 DIAGNOSIS — J309 Allergic rhinitis, unspecified: Secondary | ICD-10-CM | POA: Diagnosis not present

## 2013-01-07 DIAGNOSIS — J309 Allergic rhinitis, unspecified: Secondary | ICD-10-CM | POA: Diagnosis not present

## 2013-01-15 DIAGNOSIS — J309 Allergic rhinitis, unspecified: Secondary | ICD-10-CM | POA: Diagnosis not present

## 2013-01-19 DIAGNOSIS — J309 Allergic rhinitis, unspecified: Secondary | ICD-10-CM | POA: Diagnosis not present

## 2013-01-22 DIAGNOSIS — J309 Allergic rhinitis, unspecified: Secondary | ICD-10-CM | POA: Diagnosis not present

## 2013-01-31 ENCOUNTER — Other Ambulatory Visit: Payer: Self-pay | Admitting: Physical Medicine & Rehabilitation

## 2013-02-02 DIAGNOSIS — Z006 Encounter for examination for normal comparison and control in clinical research program: Secondary | ICD-10-CM | POA: Diagnosis not present

## 2013-02-02 DIAGNOSIS — G4730 Sleep apnea, unspecified: Secondary | ICD-10-CM | POA: Diagnosis not present

## 2013-02-02 DIAGNOSIS — G4720 Circadian rhythm sleep disorder, unspecified type: Secondary | ICD-10-CM | POA: Diagnosis not present

## 2013-02-02 DIAGNOSIS — G4710 Hypersomnia, unspecified: Secondary | ICD-10-CM | POA: Diagnosis not present

## 2013-02-02 DIAGNOSIS — J4489 Other specified chronic obstructive pulmonary disease: Secondary | ICD-10-CM | POA: Diagnosis not present

## 2013-02-02 DIAGNOSIS — J309 Allergic rhinitis, unspecified: Secondary | ICD-10-CM | POA: Diagnosis not present

## 2013-02-02 DIAGNOSIS — J449 Chronic obstructive pulmonary disease, unspecified: Secondary | ICD-10-CM | POA: Diagnosis not present

## 2013-02-02 DIAGNOSIS — G479 Sleep disorder, unspecified: Secondary | ICD-10-CM | POA: Diagnosis not present

## 2013-02-03 DIAGNOSIS — J209 Acute bronchitis, unspecified: Secondary | ICD-10-CM | POA: Diagnosis not present

## 2013-02-03 DIAGNOSIS — R0601 Orthopnea: Secondary | ICD-10-CM | POA: Diagnosis not present

## 2013-02-05 ENCOUNTER — Encounter
Payer: Medicare Other | Attending: Physical Medicine and Rehabilitation | Admitting: Physical Medicine and Rehabilitation

## 2013-02-05 ENCOUNTER — Encounter: Payer: Self-pay | Admitting: Physical Medicine and Rehabilitation

## 2013-02-05 VITALS — BP 131/80 | HR 68 | Resp 16 | Ht 63.0 in | Wt 223.0 lb

## 2013-02-05 DIAGNOSIS — J189 Pneumonia, unspecified organism: Secondary | ICD-10-CM | POA: Diagnosis not present

## 2013-02-05 DIAGNOSIS — M545 Low back pain, unspecified: Secondary | ICD-10-CM | POA: Diagnosis not present

## 2013-02-05 DIAGNOSIS — F329 Major depressive disorder, single episode, unspecified: Secondary | ICD-10-CM | POA: Diagnosis not present

## 2013-02-05 DIAGNOSIS — M47817 Spondylosis without myelopathy or radiculopathy, lumbosacral region: Secondary | ICD-10-CM | POA: Diagnosis not present

## 2013-02-05 DIAGNOSIS — B0052 Herpesviral keratitis: Secondary | ICD-10-CM | POA: Diagnosis not present

## 2013-02-05 DIAGNOSIS — M5137 Other intervertebral disc degeneration, lumbosacral region: Secondary | ICD-10-CM | POA: Insufficient documentation

## 2013-02-05 DIAGNOSIS — R209 Unspecified disturbances of skin sensation: Secondary | ICD-10-CM | POA: Diagnosis not present

## 2013-02-05 DIAGNOSIS — M51379 Other intervertebral disc degeneration, lumbosacral region without mention of lumbar back pain or lower extremity pain: Secondary | ICD-10-CM | POA: Insufficient documentation

## 2013-02-05 DIAGNOSIS — F3289 Other specified depressive episodes: Secondary | ICD-10-CM | POA: Insufficient documentation

## 2013-02-05 DIAGNOSIS — IMO0001 Reserved for inherently not codable concepts without codable children: Secondary | ICD-10-CM | POA: Insufficient documentation

## 2013-02-05 DIAGNOSIS — F411 Generalized anxiety disorder: Secondary | ICD-10-CM | POA: Diagnosis not present

## 2013-02-05 NOTE — Patient Instructions (Addendum)
Try to stay as active as tolerated, continue with your aquatic exercising .

## 2013-02-05 NOTE — Progress Notes (Signed)
Subjective:    Patient ID: Karina Greer, female    DOB: July 30, 1966, 47 y.o.   MRN: [id number]  HPI The patient complains about chronic low back pain which radiates into her right posterior hip. The patient also complains about numbness and tingling in the same distribution.  The problem has improved. She reports, that she has restarted doing aquatic PT and PT on land, after  she had a gall bladder removal on 2/10/214,  this was also approved by her surgeon. She enjoys the PT in the water, and has already lost some weight, and feels overall better. She went to [facility] a few months back for low back pain. She was told she had degenerative disk disease. I reviewed the films which showed mild ddd at L5-S1 and facet disease at that level as well as L4-5 which is mild. They suggested injections, but she really doesn't want to have them.   Pain Inventory Average Pain 6 Pain Right Now 7 My pain is constant, sharp, stabbing, tingling and aching  In the last 24 hours, has pain interfered with the following? General activity na Relation with others na Enjoyment of life na What TIME of day is your pain at its worst? constant Sleep (in general) Fair  Pain is worse with: walking, bending, sitting, inactivity, standing and some activites Pain improves with: rest, heat/ice, therapy/exercise, pacing activities, medication and TENS Relief from Meds: 8  Mobility how many minutes can you walk? 10 ability to climb steps?  yes do you drive?  yes Do you have any goals in this area?  yes  Function disabled: date disabled na I need assistance with the following:  meal prep, household duties and shopping  Neuro/Psych bowel control problems tingling trouble walking spasms dizziness depression anxiety  Prior Studies Any changes since last visit?  no  Physicians involved in your care Any changes since last visit?  no   Family History  Problem Relation Age of Onset  . Hypertension Mother   .  Diabetes Father    History   Social History  . Marital Status: Legally Separated    Spouse Name: N/A    Number of Children: N/A  . Years of Education: N/A   Social History Main Topics  . Smoking status: Never Smoker   . Smokeless tobacco: Never Used  . Alcohol Use: No  . Drug Use: None  . Sexually Active: None   Other Topics Concern  . None   Social History Narrative  . None   Past Surgical History  Procedure Laterality Date  . Cholecystectomy     Past Medical History  Diagnosis Date  . Fibromyalgia   . Depression   . Anxiety   . Abnormal gait   . Thoracolumbar back pain    BP 131/80  Pulse 68  Resp 16  Ht 5\' 3"  (1.6 m)  Wt 223 lb (101.152 kg)  BMI 39.51 kg/m2  SpO2 94%  LMP 12/19/2012     Review of Systems  Constitutional: Positive for appetite change.  Respiratory: Positive for apnea, cough, shortness of breath and wheezing.   Gastrointestinal: Positive for nausea and abdominal pain.  Musculoskeletal: Positive for gait problem.  Neurological: Positive for dizziness.       Tingling  Psychiatric/Behavioral: Positive for dysphoric mood and agitation.  All other systems reviewed and are negative.       Objective:   Physical Exam Constitutional: She is oriented to person, place, and time. She appears well-developed and well-nourished.  Morbidly  obese  HENT:  Head: Normocephalic.  Neck: Neck supple.  Musculoskeletal: She exhibits tenderness.  Neurological: She is alert and oriented to person, place, and time.  Skin: Skin is warm and dry. Redness , increased temperature, pain around at least one of her insicions, worse at RUQ. Psychiatric: She has a normal mood and affect.  Symmetric normal motor tone is noted throughout. Normal muscle bulk. Muscle testing reveals 5/5 muscle strength of the upper extremity, and 5/5 of the lower extremity. Full range of motion in upper and lower extremities. ROM of spine is mildly restricted. Fine motor movements are  normal in both hands.  Sensory is intact and symmetric to light touch, pinprick and proprioception.  DTR in the upper and lower extremity are present and symmetric 2+. No clonus is noted.  Patient arises from chair without difficulty. Narrow based gait with normal arm swing bilateral , able to walk on heels and toes . Tandem walk is stable. No pronator drift. Rhomberg negative.        Assessment & Plan:   1. Fibromyalgia.  2. Anxiety and depression.  3. History of low back pain. Mild facet disease on MRI and exam is consistent  4. pneumonia and herpes keratitis  5. S/p gallbladder removal on 2/10/ 2014  PLAN:  1. Patient should continue PT, which has helped her a lot with her mood and pain, her surgeon approved of restarting pt, she has restarted aquatic and land PT. Ordered a mobile TENS unit, which has helped her in the PT office with decreasing her pain. She now has a TENS unit.  She is using the TENS unit 4 times or more per week, for about 60 min, and it is giving her about 50% relief.  3. Continue psychiatry and psychology follow up. Her depression and anxiety have improved with her exercising.  4. Discussed weight loss and appropriate exercise and nutrition. She knows that she needs to lose weight. Gave her some suggestions about healthy nutrition.  5. Pulmonary follow up as directed. Did have a sleep study, is on C-PAP now.  Follow up in 3 month.

## 2013-02-12 DIAGNOSIS — J309 Allergic rhinitis, unspecified: Secondary | ICD-10-CM | POA: Diagnosis not present

## 2013-02-18 DIAGNOSIS — Z23 Encounter for immunization: Secondary | ICD-10-CM | POA: Diagnosis not present

## 2013-02-19 DIAGNOSIS — J309 Allergic rhinitis, unspecified: Secondary | ICD-10-CM | POA: Diagnosis not present

## 2013-02-22 ENCOUNTER — Encounter: Payer: Self-pay | Admitting: Neurology

## 2013-02-22 DIAGNOSIS — G43009 Migraine without aura, not intractable, without status migrainosus: Secondary | ICD-10-CM

## 2013-02-22 DIAGNOSIS — R209 Unspecified disturbances of skin sensation: Secondary | ICD-10-CM

## 2013-02-22 DIAGNOSIS — R9409 Abnormal results of other function studies of central nervous system: Secondary | ICD-10-CM

## 2013-02-23 ENCOUNTER — Ambulatory Visit (INDEPENDENT_AMBULATORY_CARE_PROVIDER_SITE_OTHER): Payer: Medicare Other | Admitting: Neurology

## 2013-02-23 ENCOUNTER — Encounter: Payer: Self-pay | Admitting: Neurology

## 2013-02-23 ENCOUNTER — Telehealth: Payer: Self-pay | Admitting: Physical Medicine & Rehabilitation

## 2013-02-23 VITALS — BP 116/80 | HR 74 | Ht 62.5 in | Wt 225.0 lb

## 2013-02-23 DIAGNOSIS — G43009 Migraine without aura, not intractable, without status migrainosus: Secondary | ICD-10-CM | POA: Diagnosis not present

## 2013-02-23 DIAGNOSIS — R9082 White matter disease, unspecified: Secondary | ICD-10-CM

## 2013-02-23 DIAGNOSIS — R930 Abnormal findings on diagnostic imaging of skull and head, not elsewhere classified: Secondary | ICD-10-CM | POA: Diagnosis not present

## 2013-02-23 HISTORY — DX: White matter disease, unspecified: R90.82

## 2013-02-23 MED ORDER — RIZATRIPTAN BENZOATE 10 MG PO TABS: 10.0000 mg | ORAL_TABLET | Freq: Two times a day (BID) | ORAL | Status: DC | PRN

## 2013-02-23 MED ORDER — TIZANIDINE HCL 2 MG PO TABS: 2.0000 mg | ORAL_TABLET | Freq: Four times a day (QID) | ORAL | Status: DC | PRN

## 2013-02-23 NOTE — Telephone Encounter (Signed)
Patient came to window requesting a new prescription for aquatic therapy.  The one that she had has expired and she is requesting a new one and it will need to go to [facility].  Any questions please call patient.  Clydie Braun had ordered this for patient.

## 2013-02-23 NOTE — Progress Notes (Signed)
Reason for visit: Migraine headache  Karina Greer is an 47 y.o. female  History of present illness:  Karina Greer is a 47 year old right-handed white female with a history of obesity, fibromyalgia, interstitial cystitis, migraine headache, irritable bowel syndrome, and obstructive sleep apnea. The patient is having headaches, but the frequency has reduced to about one headache every 2 weeks. The patient indicates the headaches may last 2 hours to a full day. The patient may have confusion with the headache and some visual disturbance. In the past, the patient has had an abnormal MRI of the brain, and one frontal enhancing lesion was noted. A workup for multiple sclerosis was negative with a lumbar puncture. A repeat MRI done in January 2014 showed good stability. The patient returns for an evaluation. The patient has had some issues with chest pain and shortness of breath. The patient could not tolerate low-dose aspirin. The patient indicates that the use of Maxalt does help some, but not consistently. The patient also reports some neck discomfort. The patient has Flexeril, but this does not help her neck.  Past Medical History  Diagnosis Date  . Fibromyalgia   . Depression   . Anxiety   . Abnormal gait   . Thoracolumbar back pain   . Hypertension   . Dyslipidemia   . Arthritis   . Morbid obesity   . Obstructive sleep apnea   . GERD (gastroesophageal reflux disease)   . Chronic low back pain   . History of renal calculi   . RLS (restless legs syndrome)   . Interstitial cystitis   . IBS (irritable bowel syndrome)   . Pneumonia 2013,2014    x2  . 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 02/23/2013    Past Surgical History  Procedure Laterality Date  . Cholecystectomy    . Tubal ligation Bilateral   . Shoulder surgery Right   . Ovarian biopsy    . Gallbladder surgery  02/14    Family History  Problem Relation Age of Onset  . Hypertension Mother   . Diabetes Father   .  Fibromyalgia Sister   . Hypertension Other     Cancer, Cerebrovascular disease run on mother side of family    Social history:  reports that she has never smoked. She has never used smokeless tobacco. She reports that she does not drink alcohol or use illicit drugs.  Allergies:  Allergies  Allergen Reactions  . Alendronate Sodium Anaphylaxis    Closes throat up  . Milnacipran Hcl Other (See Comments)    Makes patient bleed "from everywhere."  . Elavil (Amitriptyline Hcl) Other (See Comments)    Hallucinations   . Tramadol Palpitations and Other (See Comments)    Tachycardia   . Cymbalta (Duloxetine Hcl) Other (See Comments)    Cannot remember   . Skelaxin Other (See Comments)    Cannot remember  . Oxycodone Rash    Medications:  Current Outpatient Prescriptions on File Prior to Visit  Medication Sig Dispense Refill  . albuterol (PROVENTIL HFA;VENTOLIN HFA) 108 (90 BASE) MCG/ACT inhaler Inhale 2 puffs into the lungs every 6 (six) hours as needed.       [name] b complex vitamins tablet Take 1 tablet by mouth daily.      . cetirizine (ZYRTEC) 5 MG tablet Take 5 mg by mouth.      . cyclobenzaprine (FLEXERIL) 5 MG tablet Take 5 mg by mouth 3 (three) times daily as needed.       [name]  dicyclomine (BENTYL) 10 MG capsule Take 10 mg by mouth 2 (two) times daily.       [name] HYDROcodone-acetaminophen (NORCO) 10-325 MG per tablet TAKE 1 TABLET EVERY 6 HOURS AS NEEDED FOR PAIN **MUST LAST 30 DAYS**  120 tablet  0  . ibandronate (BONIVA) 150 MG tablet Take 150 mg by mouth once a week. Take in the morning with a full glass of water, on an empty stomach, and do not take anything else by mouth or lie down for the next 30 min.      [name] omeprazole (PRILOSEC) 10 MG capsule Take by mouth.      . ondansetron (ZOFRAN-ODT) 4 MG disintegrating tablet Take 4 mg by mouth every 8 (eight) hours as needed.       . promethazine (PHENERGAN) 25 MG tablet Take 25 mg by mouth every 6 (six) hours as needed.        No  current facility-administered medications on file prior to visit.    ROS:  Out of a complete 14 system review of symptoms, the patient complains only of the following symptoms, and all other reviewed systems are negative.  Weight loss, fatigue Chest pain, palpitations Blurred vision, loss of vision with headache Shortness of breath, wheezing, snoring Urination problems, incontinence Flushing Joint pain, muscle cramps, achy muscles Allergies, skin sensitivity Memory loss, confusion, headache, numbness, weakness, dizziness Depression, anxiety, change in appetite, racing thoughts Insomnia, snoring, restless legs  Blood pressure 116/80, pulse 74, height 5' 2.5" (1.588 m), weight 225 lb (102.059 kg), last menstrual period 12/19/2012.  Physical Exam  General: The patient is alert and cooperative at the time of the examination. The patient is markedly obese.  Skin: No significant peripheral edema is noted.   Neurologic Exam  Cranial nerves: Facial symmetry is present. Speech is normal, no aphasia or dysarthria is noted. Extraocular movements are full. Visual fields are full.  Motor: The patient has good strength in all 4 extremities.  Coordination: The patient has good finger-nose-finger and heel-to-shin bilaterally.  Gait and station: The patient has a normal gait. Tandem gait is normal. Romberg is negative. No drift is seen.  Reflexes: Deep tendon reflexes are symmetric.   Assessment/Plan:  One. Migraine headache  2. Obesity  3. Fibromyalgia  4. Irritable bowel syndrome  5. Interstitial cystitis  6. Obstructive sleep apnea  7. Abnormal MRI brain  The patient will be set up for a transcranial doppler bubble study to evaluate for a patent foramen ovale. If this is unremarkable, the patient will be considered for genetic testing for the notch 3 gene looking for CADISIL syndrome. The patient was given refills for Maxalt, and placed on tizanidine. The patient will  followup in 6 months.  Marlan Palau MD 02/23/2013 8:39 PM  [facility] [address] [place], [place] [address]  Phone [phone] Fax [phone]

## 2013-02-24 NOTE — Telephone Encounter (Signed)
I put the order in , will also tell [facility]

## 2013-02-24 NOTE — Addendum Note (Signed)
Addended by: Su Monks on: 02/24/2013 11:08 AM   Modules accepted: Orders

## 2013-03-01 ENCOUNTER — Other Ambulatory Visit: Payer: Self-pay | Admitting: Physical Medicine and Rehabilitation

## 2013-03-04 DIAGNOSIS — R262 Difficulty in walking, not elsewhere classified: Secondary | ICD-10-CM | POA: Diagnosis not present

## 2013-03-04 DIAGNOSIS — M47817 Spondylosis without myelopathy or radiculopathy, lumbosacral region: Secondary | ICD-10-CM | POA: Diagnosis not present

## 2013-03-04 DIAGNOSIS — IMO0001 Reserved for inherently not codable concepts without codable children: Secondary | ICD-10-CM | POA: Diagnosis not present

## 2013-03-05 DIAGNOSIS — J309 Allergic rhinitis, unspecified: Secondary | ICD-10-CM | POA: Diagnosis not present

## 2013-03-10 DIAGNOSIS — IMO0001 Reserved for inherently not codable concepts without codable children: Secondary | ICD-10-CM | POA: Diagnosis not present

## 2013-03-10 DIAGNOSIS — R262 Difficulty in walking, not elsewhere classified: Secondary | ICD-10-CM | POA: Diagnosis not present

## 2013-03-10 DIAGNOSIS — M47817 Spondylosis without myelopathy or radiculopathy, lumbosacral region: Secondary | ICD-10-CM | POA: Diagnosis not present

## 2013-03-11 ENCOUNTER — Other Ambulatory Visit: Payer: Medicare Other

## 2013-03-11 ENCOUNTER — Other Ambulatory Visit: Payer: Self-pay | Admitting: Neurology

## 2013-03-11 DIAGNOSIS — R209 Unspecified disturbances of skin sensation: Secondary | ICD-10-CM

## 2013-03-12 DIAGNOSIS — IMO0001 Reserved for inherently not codable concepts without codable children: Secondary | ICD-10-CM | POA: Diagnosis not present

## 2013-03-12 DIAGNOSIS — R262 Difficulty in walking, not elsewhere classified: Secondary | ICD-10-CM | POA: Diagnosis not present

## 2013-03-12 DIAGNOSIS — M47817 Spondylosis without myelopathy or radiculopathy, lumbosacral region: Secondary | ICD-10-CM | POA: Diagnosis not present

## 2013-03-19 DIAGNOSIS — J309 Allergic rhinitis, unspecified: Secondary | ICD-10-CM | POA: Diagnosis not present

## 2013-03-31 ENCOUNTER — Other Ambulatory Visit: Payer: Self-pay | Admitting: Physical Medicine and Rehabilitation

## 2013-04-02 DIAGNOSIS — J309 Allergic rhinitis, unspecified: Secondary | ICD-10-CM | POA: Diagnosis not present

## 2013-04-08 ENCOUNTER — Ambulatory Visit (INDEPENDENT_AMBULATORY_CARE_PROVIDER_SITE_OTHER): Payer: Medicare Other

## 2013-04-08 ENCOUNTER — Ambulatory Visit (INDEPENDENT_AMBULATORY_CARE_PROVIDER_SITE_OTHER): Payer: Medicare Other | Admitting: Neurology

## 2013-04-08 DIAGNOSIS — G43009 Migraine without aura, not intractable, without status migrainosus: Secondary | ICD-10-CM

## 2013-04-08 DIAGNOSIS — Z0289 Encounter for other administrative examinations: Secondary | ICD-10-CM

## 2013-04-08 DIAGNOSIS — R9082 White matter disease, unspecified: Secondary | ICD-10-CM

## 2013-04-08 DIAGNOSIS — R209 Unspecified disturbances of skin sensation: Secondary | ICD-10-CM

## 2013-04-08 DIAGNOSIS — H5310 Unspecified subjective visual disturbances: Secondary | ICD-10-CM | POA: Diagnosis not present

## 2013-04-08 DIAGNOSIS — R9409 Abnormal results of other function studies of central nervous system: Secondary | ICD-10-CM | POA: Diagnosis not present

## 2013-04-19 DIAGNOSIS — N3010 Interstitial cystitis (chronic) without hematuria: Secondary | ICD-10-CM | POA: Diagnosis not present

## 2013-04-19 DIAGNOSIS — R5383 Other fatigue: Secondary | ICD-10-CM | POA: Diagnosis not present

## 2013-04-19 DIAGNOSIS — Z79899 Other long term (current) drug therapy: Secondary | ICD-10-CM | POA: Diagnosis not present

## 2013-04-19 DIAGNOSIS — R5381 Other malaise: Secondary | ICD-10-CM | POA: Diagnosis not present

## 2013-04-19 DIAGNOSIS — R358 Other polyuria: Secondary | ICD-10-CM | POA: Diagnosis not present

## 2013-04-19 DIAGNOSIS — R3589 Other polyuria: Secondary | ICD-10-CM | POA: Diagnosis not present

## 2013-04-20 DIAGNOSIS — J309 Allergic rhinitis, unspecified: Secondary | ICD-10-CM | POA: Diagnosis not present

## 2013-04-21 DIAGNOSIS — J309 Allergic rhinitis, unspecified: Secondary | ICD-10-CM | POA: Diagnosis not present

## 2013-04-28 DIAGNOSIS — R0602 Shortness of breath: Secondary | ICD-10-CM | POA: Diagnosis not present

## 2013-04-28 DIAGNOSIS — J309 Allergic rhinitis, unspecified: Secondary | ICD-10-CM | POA: Diagnosis not present

## 2013-04-30 ENCOUNTER — Other Ambulatory Visit: Payer: Self-pay | Admitting: Physical Medicine and Rehabilitation

## 2013-04-30 NOTE — Telephone Encounter (Signed)
Called hydrocodone into [facility].

## 2013-05-06 ENCOUNTER — Encounter
Payer: Medicare Other | Attending: Physical Medicine and Rehabilitation | Admitting: Physical Medicine and Rehabilitation

## 2013-05-06 ENCOUNTER — Encounter: Payer: Self-pay | Admitting: Physical Medicine and Rehabilitation

## 2013-05-06 VITALS — BP 118/78 | HR 70 | Resp 14 | Ht 63.0 in | Wt 216.0 lb

## 2013-05-06 DIAGNOSIS — M47817 Spondylosis without myelopathy or radiculopathy, lumbosacral region: Secondary | ICD-10-CM

## 2013-05-06 DIAGNOSIS — F329 Major depressive disorder, single episode, unspecified: Secondary | ICD-10-CM | POA: Insufficient documentation

## 2013-05-06 DIAGNOSIS — IMO0001 Reserved for inherently not codable concepts without codable children: Secondary | ICD-10-CM | POA: Diagnosis not present

## 2013-05-06 DIAGNOSIS — I10 Essential (primary) hypertension: Secondary | ICD-10-CM | POA: Insufficient documentation

## 2013-05-06 DIAGNOSIS — M25559 Pain in unspecified hip: Secondary | ICD-10-CM | POA: Diagnosis not present

## 2013-05-06 DIAGNOSIS — R209 Unspecified disturbances of skin sensation: Secondary | ICD-10-CM | POA: Diagnosis not present

## 2013-05-06 DIAGNOSIS — F4001 Agoraphobia with panic disorder: Secondary | ICD-10-CM | POA: Diagnosis not present

## 2013-05-06 DIAGNOSIS — Z79899 Other long term (current) drug therapy: Secondary | ICD-10-CM

## 2013-05-06 DIAGNOSIS — Z5181 Encounter for therapeutic drug level monitoring: Secondary | ICD-10-CM | POA: Diagnosis not present

## 2013-05-06 DIAGNOSIS — M545 Low back pain, unspecified: Secondary | ICD-10-CM | POA: Insufficient documentation

## 2013-05-06 DIAGNOSIS — E785 Hyperlipidemia, unspecified: Secondary | ICD-10-CM | POA: Insufficient documentation

## 2013-05-06 DIAGNOSIS — Z8701 Personal history of pneumonia (recurrent): Secondary | ICD-10-CM | POA: Insufficient documentation

## 2013-05-06 DIAGNOSIS — Z9089 Acquired absence of other organs: Secondary | ICD-10-CM | POA: Insufficient documentation

## 2013-05-06 DIAGNOSIS — F411 Generalized anxiety disorder: Secondary | ICD-10-CM | POA: Insufficient documentation

## 2013-05-06 DIAGNOSIS — F3289 Other specified depressive episodes: Secondary | ICD-10-CM | POA: Insufficient documentation

## 2013-05-06 DIAGNOSIS — G4733 Obstructive sleep apnea (adult) (pediatric): Secondary | ICD-10-CM | POA: Diagnosis not present

## 2013-05-06 DIAGNOSIS — K219 Gastro-esophageal reflux disease without esophagitis: Secondary | ICD-10-CM | POA: Insufficient documentation

## 2013-05-06 MED ORDER — HYDROCODONE-ACETAMINOPHEN 10-325 MG PO TABS: ORAL_TABLET | ORAL | Status: DC

## 2013-05-06 MED ORDER — DICLOFENAC SODIUM 1 % TD GEL: 2.0000 g | Freq: Four times a day (QID) | TRANSDERMAL | Status: DC

## 2013-05-06 NOTE — Progress Notes (Signed)
Subjective:    Patient ID: Karina Greer, female    DOB: 10/07/1965, 47 y.o.   MRN: [id number]  HPI The patient complains about chronic low back pain which radiates into her right posterior hip. The patient also complains about numbness and tingling in the same distribution.  The problem has improved. She reports, that she has restarted doing aquatic PT and PT on land, after she had a gall bladder removal on 2/10/214, this was also approved by her surgeon. She enjoys the PT in the water, and has already lost some weight, and feels overall better.  She went to [facility] a few months back for low back pain. She was told she had degenerative disk disease. I reviewed the films which showed mild ddd at L5-S1 and facet disease at that level as well as L4-5 which is mild. They suggested injections, but she really doesn't want to have them.  The patient reports that she slipped in her shower 2 weeks ago, where she hurt her left knee. It was mildly swollen for 2-3 days, this has resolved, no bruising. Pain Inventory Average Pain 6 Pain Right Now 9 My pain is constant, sharp, stabbing, tingling and aching  In the last 24 hours, has pain interfered with the following? General activity 9 Relation with others 9 Enjoyment of life 10 What TIME of day is your pain at its worst? constant Sleep (in general) Poor  Pain is worse with: walking, bending, sitting, inactivity, standing and some activites Pain improves with: rest, heat/ice, therapy/exercise, medication and TENS Relief from Meds: 5  Mobility walk with assistance how many minutes can you walk? 10 ability to climb steps?  yes do you drive?  yes Do you have any goals in this area?  yes  Function disabled: date disabled 2005 I need assistance with the following:  meal prep, household duties and shopping Do you have any goals in this area?  yes  Neuro/Psych bladder control problems weakness numbness tingling trouble  walking spasms dizziness confusion anxiety loss of taste or smell suicidal thoughts-no plan  Prior Studies Any changes since last visit?  no  Physicians involved in your care Any changes since last visit?  no   Family History  Problem Relation Age of Onset  . Hypertension Mother   . Diabetes Father   . Fibromyalgia Sister   . Hypertension Other     Cancer, Cerebrovascular disease run on mother side of family   History   Social History  . Marital Status: Legally Separated    Spouse Name: N/A    Number of Children: 3  . Years of Education: HS   Occupational History  . Unemployed     Disability   Social History Main Topics  . Smoking status: Never Smoker   . Smokeless tobacco: Never Used  . Alcohol Use: No  . Drug Use: No  . Sexual Activity: None   Other Topics Concern  . None   Social History Narrative  . None   Past Surgical History  Procedure Laterality Date  . Cholecystectomy    . Tubal ligation Bilateral   . Shoulder surgery Right   . Ovarian biopsy    . Gallbladder surgery  02/14   Past Medical History  Diagnosis Date  . Fibromyalgia   . Depression   . Anxiety   . Abnormal gait   . Thoracolumbar back pain   . Hypertension   . Dyslipidemia   . Arthritis   . Morbid obesity   .  Obstructive sleep apnea   . GERD (gastroesophageal reflux disease)   . Chronic low back pain   . History of renal calculi   . RLS (restless legs syndrome)   . Interstitial cystitis   . IBS (irritable bowel syndrome)   . Pneumonia 2013,2014    x2  . 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 02/23/2013   BP 118/78  Pulse 70  Resp 14  Ht 5\' 3"  (1.6 m)  Wt 216 lb (97.977 kg)  BMI 38.27 kg/m2  SpO2 96%  LMP 03/06/2013     Review of Systems  Constitutional: Positive for fever and chills.  Gastrointestinal: Positive for nausea, vomiting and abdominal pain.  Musculoskeletal: Positive for myalgias, arthralgias and gait problem.  Neurological: Positive for  dizziness, weakness and numbness.  Psychiatric/Behavioral: Positive for suicidal ideas and confusion. The patient is nervous/anxious.   All other systems reviewed and are negative.       Objective:   Physical Exam Constitutional: She is oriented to person, place, and time. She appears well-developed and well-nourished.  Morbidly obese  HENT:  Head: Normocephalic.  Neck: Neck supple.  Musculoskeletal: She exhibits tenderness at junction of muscle/tendon of left vastus medialis Neurological: She is alert and oriented to person, place, and time.  Skin: Skin is warm and dry. Redness , increased temperature, pain around at least one of her insicions, worse at RUQ. Psychiatric: She has a normal mood and affect.  Symmetric normal motor tone is noted throughout. Normal muscle bulk. Muscle testing reveals 5/5 muscle strength of the upper extremity, and 5/5 of the lower extremity. Full range of motion in upper and lower extremities. ROM of spine is mildly restricted. Fine motor movements are normal in both hands.  Sensory is intact and symmetric to light touch, pinprick and proprioception.  DTR in the upper and lower extremity are present and symmetric 2+. No clonus is noted.  Patient arises from chair without difficulty. Narrow based gait with normal arm swing bilateral , able to walk on heels and toes . Tandem walk is stable. No pronator drift. Rhomberg negative. ROM,stability tests of left knee, no significant findings       Assessment & Plan:  1. Fibromyalgia.  2. Anxiety and depression.  3. History of low back pain. Mild facet disease on MRI and exam is consistent  4. pneumonia and herpes keratitis  5. S/p gallbladder removal on 2/10/ 2014  6. Muscle pain at muscle/tendon junction of vastus medialis on the left, after slipping in the shower 2 weeks ago.Prescribed Voltaren gel, showed her some stretches, and how to do deep friction massage at that location. Consider further imaging, if not  resolved in the next 2 month. PLAN:  1. Patient should continue PT, which has helped her a lot with her mood and pain, her surgeon approved of restarting pt, she has restarted aquatic and land PT. Ordered a mobile TENS unit, which has helped her in the PT office with decreasing her pain. She now has a TENS unit. Reordered PT on land, also with stretches for quadriceps She is using the TENS unit 4 times or more per week, for about 60 min, and it is giving her about 50% relief.  3. Continue psychiatry and psychology follow up. Her depression and anxiety have improved with her exercising.  4. Discussed weight loss and appropriate exercise and nutrition. She knows that she needs to lose weight. Gave her some suggestions about healthy nutrition.  5. Pulmonary follow up as directed. Did  have a sleep study, is on C-PAP now.  Follow up in 3 month.

## 2013-05-12 DIAGNOSIS — IMO0001 Reserved for inherently not codable concepts without codable children: Secondary | ICD-10-CM | POA: Diagnosis not present

## 2013-05-12 DIAGNOSIS — M47817 Spondylosis without myelopathy or radiculopathy, lumbosacral region: Secondary | ICD-10-CM | POA: Diagnosis not present

## 2013-05-12 DIAGNOSIS — M549 Dorsalgia, unspecified: Secondary | ICD-10-CM | POA: Diagnosis not present

## 2013-05-12 DIAGNOSIS — M25569 Pain in unspecified knee: Secondary | ICD-10-CM | POA: Diagnosis not present

## 2013-05-14 DIAGNOSIS — J309 Allergic rhinitis, unspecified: Secondary | ICD-10-CM | POA: Diagnosis not present

## 2013-05-28 DIAGNOSIS — J209 Acute bronchitis, unspecified: Secondary | ICD-10-CM | POA: Diagnosis not present

## 2013-05-28 DIAGNOSIS — S63509A Unspecified sprain of unspecified wrist, initial encounter: Secondary | ICD-10-CM | POA: Diagnosis not present

## 2013-06-01 ENCOUNTER — Ambulatory Visit: Payer: Self-pay | Admitting: Internal Medicine

## 2013-06-01 DIAGNOSIS — R059 Cough, unspecified: Secondary | ICD-10-CM | POA: Diagnosis not present

## 2013-06-01 DIAGNOSIS — R0602 Shortness of breath: Secondary | ICD-10-CM | POA: Diagnosis not present

## 2013-06-01 DIAGNOSIS — R05 Cough: Secondary | ICD-10-CM | POA: Diagnosis not present

## 2013-06-01 DIAGNOSIS — R918 Other nonspecific abnormal finding of lung field: Secondary | ICD-10-CM | POA: Diagnosis not present

## 2013-06-01 DIAGNOSIS — J441 Chronic obstructive pulmonary disease with (acute) exacerbation: Secondary | ICD-10-CM | POA: Diagnosis not present

## 2013-06-02 DIAGNOSIS — M25539 Pain in unspecified wrist: Secondary | ICD-10-CM | POA: Diagnosis not present

## 2013-06-02 DIAGNOSIS — M79609 Pain in unspecified limb: Secondary | ICD-10-CM | POA: Diagnosis not present

## 2013-06-03 ENCOUNTER — Telehealth: Payer: Self-pay

## 2013-06-03 NOTE — Telephone Encounter (Signed)
Usually it is 10mg  hydrocodone per dose of her cough syrup, if that is the case ,she should take one tablet less for every dose of the cough syrup she is taking, because she is already taking Hydrocodone tablets 10mg  qid

## 2013-06-03 NOTE — Telephone Encounter (Signed)
Patient advised to decrease her dose of oral hydrocodone while talking the tussenex.  She understands.

## 2013-06-03 NOTE — Telephone Encounter (Signed)
Patient says she has bronchitis and was given tussenex.  Patient would like to know it its ok to take.

## 2013-06-15 DIAGNOSIS — N764 Abscess of vulva: Secondary | ICD-10-CM | POA: Diagnosis not present

## 2013-06-15 DIAGNOSIS — N938 Other specified abnormal uterine and vaginal bleeding: Secondary | ICD-10-CM | POA: Diagnosis not present

## 2013-06-15 DIAGNOSIS — J449 Chronic obstructive pulmonary disease, unspecified: Secondary | ICD-10-CM | POA: Diagnosis not present

## 2013-06-15 DIAGNOSIS — R109 Unspecified abdominal pain: Secondary | ICD-10-CM | POA: Diagnosis not present

## 2013-06-15 DIAGNOSIS — N949 Unspecified condition associated with female genital organs and menstrual cycle: Secondary | ICD-10-CM | POA: Diagnosis not present

## 2013-06-15 DIAGNOSIS — M810 Age-related osteoporosis without current pathological fracture: Secondary | ICD-10-CM | POA: Diagnosis not present

## 2013-06-15 DIAGNOSIS — D259 Leiomyoma of uterus, unspecified: Secondary | ICD-10-CM | POA: Diagnosis not present

## 2013-06-15 DIAGNOSIS — J309 Allergic rhinitis, unspecified: Secondary | ICD-10-CM | POA: Diagnosis not present

## 2013-06-28 ENCOUNTER — Telehealth: Payer: Self-pay

## 2013-06-28 DIAGNOSIS — J189 Pneumonia, unspecified organism: Secondary | ICD-10-CM | POA: Diagnosis not present

## 2013-06-28 NOTE — Telephone Encounter (Signed)
Patient aware of new hydrocodone dosing while taking cough syrup.

## 2013-06-28 NOTE — Telephone Encounter (Signed)
Patient called and said that she went to her PCP and was diagnosed with pneumonia and prescribed Tussinex, She was calling to see if she could take it.

## 2013-06-28 NOTE — Telephone Encounter (Signed)
Usually it is 10mg  hydrocodone per dose of her cough syrup, if that is the case ,she should take one tablet less for every dose of the cough syrup she is taking, because she is already taking Hydrocodone tablets 10mg  qid. Please let patient know

## 2013-07-05 DIAGNOSIS — J449 Chronic obstructive pulmonary disease, unspecified: Secondary | ICD-10-CM | POA: Diagnosis not present

## 2013-07-05 DIAGNOSIS — J309 Allergic rhinitis, unspecified: Secondary | ICD-10-CM | POA: Diagnosis not present

## 2013-07-06 DIAGNOSIS — B0052 Herpesviral keratitis: Secondary | ICD-10-CM | POA: Diagnosis not present

## 2013-07-09 DIAGNOSIS — F4001 Agoraphobia with panic disorder: Secondary | ICD-10-CM | POA: Diagnosis not present

## 2013-07-09 DIAGNOSIS — B0052 Herpesviral keratitis: Secondary | ICD-10-CM | POA: Diagnosis not present

## 2013-07-12 DIAGNOSIS — Z23 Encounter for immunization: Secondary | ICD-10-CM | POA: Diagnosis not present

## 2013-07-15 DIAGNOSIS — H1045 Other chronic allergic conjunctivitis: Secondary | ICD-10-CM | POA: Diagnosis not present

## 2013-07-21 DIAGNOSIS — L719 Rosacea, unspecified: Secondary | ICD-10-CM | POA: Diagnosis not present

## 2013-07-22 DIAGNOSIS — K7689 Other specified diseases of liver: Secondary | ICD-10-CM | POA: Diagnosis not present

## 2013-07-22 DIAGNOSIS — J309 Allergic rhinitis, unspecified: Secondary | ICD-10-CM | POA: Diagnosis not present

## 2013-07-22 DIAGNOSIS — K589 Irritable bowel syndrome without diarrhea: Secondary | ICD-10-CM | POA: Diagnosis not present

## 2013-08-03 ENCOUNTER — Encounter
Payer: Medicare Other | Attending: Physical Medicine and Rehabilitation | Admitting: Physical Medicine and Rehabilitation

## 2013-08-03 ENCOUNTER — Ambulatory Visit: Payer: Medicare Other | Admitting: Physical Medicine and Rehabilitation

## 2013-08-03 ENCOUNTER — Encounter: Payer: Self-pay | Admitting: Physical Medicine and Rehabilitation

## 2013-08-03 VITALS — BP 132/80 | HR 86 | Resp 14 | Ht 63.0 in | Wt 217.6 lb

## 2013-08-03 DIAGNOSIS — G4733 Obstructive sleep apnea (adult) (pediatric): Secondary | ICD-10-CM | POA: Diagnosis not present

## 2013-08-03 DIAGNOSIS — IMO0001 Reserved for inherently not codable concepts without codable children: Secondary | ICD-10-CM | POA: Insufficient documentation

## 2013-08-03 DIAGNOSIS — F329 Major depressive disorder, single episode, unspecified: Secondary | ICD-10-CM | POA: Diagnosis not present

## 2013-08-03 DIAGNOSIS — Z9089 Acquired absence of other organs: Secondary | ICD-10-CM | POA: Insufficient documentation

## 2013-08-03 DIAGNOSIS — M5137 Other intervertebral disc degeneration, lumbosacral region: Secondary | ICD-10-CM | POA: Diagnosis not present

## 2013-08-03 DIAGNOSIS — M47817 Spondylosis without myelopathy or radiculopathy, lumbosacral region: Secondary | ICD-10-CM

## 2013-08-03 DIAGNOSIS — F411 Generalized anxiety disorder: Secondary | ICD-10-CM | POA: Diagnosis not present

## 2013-08-03 DIAGNOSIS — M51379 Other intervertebral disc degeneration, lumbosacral region without mention of lumbar back pain or lower extremity pain: Secondary | ICD-10-CM | POA: Insufficient documentation

## 2013-08-03 DIAGNOSIS — F3289 Other specified depressive episodes: Secondary | ICD-10-CM | POA: Insufficient documentation

## 2013-08-03 MED ORDER — HYDROCODONE-ACETAMINOPHEN 10-325 MG PO TABS: ORAL_TABLET | ORAL | Status: DC

## 2013-08-03 NOTE — Progress Notes (Signed)
Subjective:    Patient ID: Karina Greer, female    DOB: 05/23/1966, 47 y.o.   MRN: [id number]  HPI The patient complains about chronic low back pain which radiates into her right posterior hip. The patient also complains about numbness and tingling in the same distribution.  The problem has gotten a little worse. Mainly because she was sick the last 3 month with having a bladder infection, severe pneumonia, shingles, and flare up of her Rosacea and IBS, she has been and still is on Ax almost for the last 3 month. She reports, that she has not done aquatic PT , which she had enjoyed before.  She went to [facility] a few months back for low back pain. She was told she had degenerative disk disease. I reviewed the films which showed mild ddd at L5-S1 and facet disease at that level as well as L4-5 which is mild. They suggested injections, but she really doesn't want to have them.    Pain Inventory Average Pain 7 Pain Right Now 5 My pain is constant, burning, stabbing, tingling and aching  In the last 24 hours, has pain interfered with the following? General activity 6 Relation with others 4 Enjoyment of life 7 What TIME of day is your pain at its worst? all Sleep (in general) Fair  Pain is worse with: walking, bending, sitting, standing and some activites Pain improves with: rest, heat/ice, therapy/exercise, medication and TENS Relief from Meds: 5  Mobility walk without assistance how many minutes can you walk? 10  Function disabled: date disabled . I need assistance with the following:  meal prep, household duties and shopping  Neuro/Psych bladder control problems numbness tingling spasms dizziness depression anxiety suicidal thoughts  No plan just thoughts  Prior Studies Any changes since last visit?  no  Physicians involved in your care Any changes since last visit?  no   Family History  Problem Relation Age of Onset  . Hypertension Mother   . Diabetes Father   .  Fibromyalgia Sister   . Hypertension Other     Cancer, Cerebrovascular disease run on mother side of family   History   Social History  . Marital Status: Legally Separated    Spouse Name: N/A    Number of Children: 3  . Years of Education: HS   Occupational History  . Unemployed     Disability   Social History Main Topics  . Smoking status: Never Smoker   . Smokeless tobacco: Never Used  . Alcohol Use: No  . Drug Use: No  . Sexual Activity: None   Other Topics Concern  . None   Social History Narrative  . None   Past Surgical History  Procedure Laterality Date  . Cholecystectomy    . Tubal ligation Bilateral   . Shoulder surgery Right   . Ovarian biopsy    . Gallbladder surgery  02/14   Past Medical History  Diagnosis Date  . Fibromyalgia   . Depression   . Anxiety   . Abnormal gait   . Thoracolumbar back pain   . Hypertension   . Dyslipidemia   . Arthritis   . Morbid obesity   . Obstructive sleep apnea   . GERD (gastroesophageal reflux disease)   . Chronic low back pain   . History of renal calculi   . RLS (restless legs syndrome)   . Interstitial cystitis   . IBS (irritable bowel syndrome)   . Pneumonia 2013,2014    x2  .  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 02/23/2013   BP 132/80  Pulse 86  Resp 14  Ht 5\' 3"  (1.6 m)  Wt 217 lb 9.6 oz (98.703 kg)  BMI 38.56 kg/m2  SpO2 99%   Review of Systems  Constitutional: Positive for appetite change and unexpected weight change.  Respiratory: Positive for apnea.   Gastrointestinal: Positive for nausea, abdominal pain, diarrhea and constipation.  Musculoskeletal:       Spasms  Skin: Positive for rash.  Neurological: Positive for dizziness.       Tingling  Psychiatric/Behavioral: Positive for suicidal ideas and dysphoric mood. The patient is nervous/anxious.        No plan  All other systems reviewed and are negative.       Objective:   Physical Exam Constitutional: She is oriented to  person, place, and time. She appears well-developed and well-nourished.  Morbidly obese  HENT:  Head: Normocephalic.  Neck: Neck supple.  Musculoskeletal: She exhibits tenderness at junction of muscle/tendon of left vastus medialis Neurological: She is alert and oriented to person, place, and time.  Skin: Skin is warm and dry. Psychiatric: She has a normal mood and affect.  Symmetric normal motor tone is noted throughout. Normal muscle bulk. Muscle testing reveals 5/5 muscle strength of the upper extremity, and 5/5 of the lower extremity. Full range of motion in upper and lower extremities. ROM of spine is mildly restricted. Fine motor movements are normal in both hands.  Sensory is intact and symmetric to light touch, pinprick and proprioception.  DTR in the upper and lower extremity are present and symmetric 2+. No clonus is noted.  Patient arises from chair without difficulty. Narrow based gait with normal arm swing bilateral , able to walk on heels and toes . Tandem walk is stable. No pronator drift. Rhomberg negative.  ROM,stability tests of left knee, no significant findings        Assessment & Plan:  1. Fibromyalgia.  2. Anxiety and depression.  3. History of low back pain. Mild facet disease on MRI and exam is consistent  4. She was sick the last 3 month with having a bladder infection, severe pneumonia, shingles, and flare up of her Rosacea and IBS, she has been and still is on Ax almost for the last 3 month. She reports, that she has not done aquatic PT , which she had enjoyed before. Advised patient to get her hormones, TSH, Vit D etc checked, her immune system seems to be a little deficient. She stated that she will see her OBGYN tomorrow, her neurologist next month, and that she will also go to her PCP for her yearly check up and will ask them for an extensive work up. 5. S/p gallbladder removal on 2/10/ 2014   PLAN:  1. Patient should continue with exercising, when she feels  a little better, exercising has helped her a lot with her mood and pain. Continue with the TENS unit, which has helped her . She is using the TENS unit 4 times or more per week, for about 60 min, and it is giving her about 50% relief.  3. Continue psychiatry and psychology follow up. Her depression and anxiety have gotten worse, because of all her sicknesses in the last 3 month.  4. Discussed weight loss and appropriate exercise and nutrition. She knows that she needs to lose weight. Gave her some suggestions about healthy nutrition.  5. Pulmonary follow up as directed. Did have a sleep study, is  on C-PAP now.  Refilled Hydrocodone 10mg , qid, #120 Follow up in 1 month.

## 2013-08-03 NOTE — Patient Instructions (Signed)
Continue with your exercise program as pain permits

## 2013-08-04 DIAGNOSIS — R197 Diarrhea, unspecified: Secondary | ICD-10-CM | POA: Diagnosis not present

## 2013-08-04 DIAGNOSIS — Z124 Encounter for screening for malignant neoplasm of cervix: Secondary | ICD-10-CM | POA: Diagnosis not present

## 2013-08-04 DIAGNOSIS — Z01419 Encounter for gynecological examination (general) (routine) without abnormal findings: Secondary | ICD-10-CM | POA: Diagnosis not present

## 2013-08-04 DIAGNOSIS — L719 Rosacea, unspecified: Secondary | ICD-10-CM | POA: Diagnosis not present

## 2013-08-04 DIAGNOSIS — R300 Dysuria: Secondary | ICD-10-CM | POA: Diagnosis not present

## 2013-08-04 DIAGNOSIS — R87610 Atypical squamous cells of undetermined significance on cytologic smear of cervix (ASC-US): Secondary | ICD-10-CM | POA: Diagnosis not present

## 2013-08-04 DIAGNOSIS — L981 Factitial dermatitis: Secondary | ICD-10-CM | POA: Diagnosis not present

## 2013-08-05 DIAGNOSIS — Z1231 Encounter for screening mammogram for malignant neoplasm of breast: Secondary | ICD-10-CM | POA: Diagnosis not present

## 2013-08-05 DIAGNOSIS — J309 Allergic rhinitis, unspecified: Secondary | ICD-10-CM | POA: Diagnosis not present

## 2013-08-11 DIAGNOSIS — R0789 Other chest pain: Secondary | ICD-10-CM | POA: Diagnosis not present

## 2013-08-11 DIAGNOSIS — G2581 Restless legs syndrome: Secondary | ICD-10-CM | POA: Diagnosis not present

## 2013-08-11 DIAGNOSIS — B079 Viral wart, unspecified: Secondary | ICD-10-CM | POA: Diagnosis not present

## 2013-08-11 DIAGNOSIS — E7800 Pure hypercholesterolemia, unspecified: Secondary | ICD-10-CM | POA: Diagnosis not present

## 2013-08-11 DIAGNOSIS — F341 Dysthymic disorder: Secondary | ICD-10-CM | POA: Diagnosis not present

## 2013-08-11 DIAGNOSIS — IMO0001 Reserved for inherently not codable concepts without codable children: Secondary | ICD-10-CM | POA: Diagnosis not present

## 2013-08-12 DIAGNOSIS — L719 Rosacea, unspecified: Secondary | ICD-10-CM | POA: Diagnosis not present

## 2013-08-16 IMAGING — CR CERVICAL SPINE - COMPLETE 4+ VIEW
1 series · 7 of 7 positions shown · non-contrast
Comparison: none

REASON FOR EXAM: trauma to head last night lt side neck pain
COMMENTS:

PROCEDURE:     DXR - DXR CERVICAL SPINE COMPLETE  - August 19, 2011  [DATE]
RESULT:     Comparison: None.

[Series 1: w cervical spine lat · 0.14mm/px · 7 of 7 slices shown]
[im 1/7]
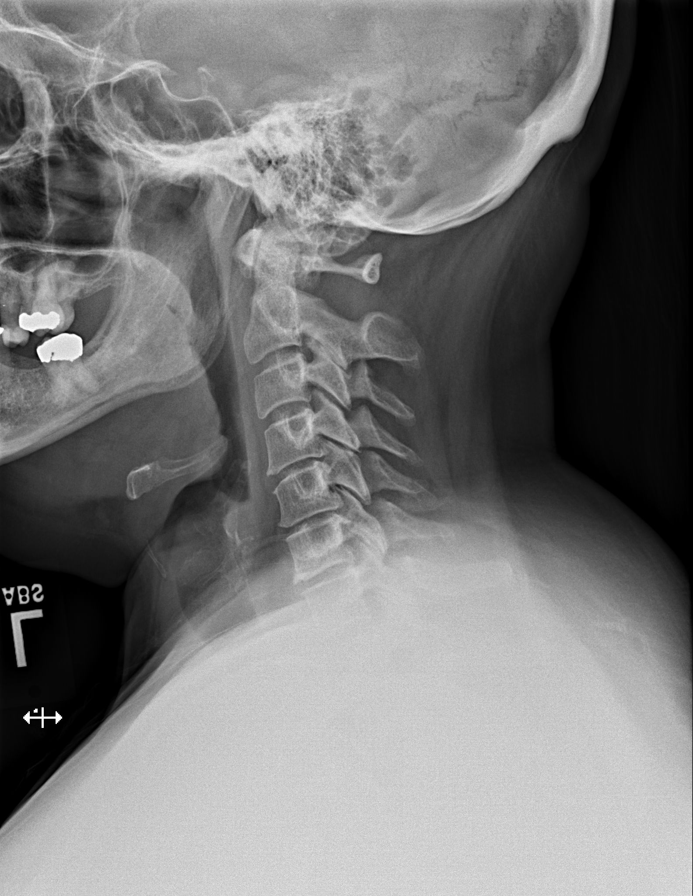
[im 2/7]
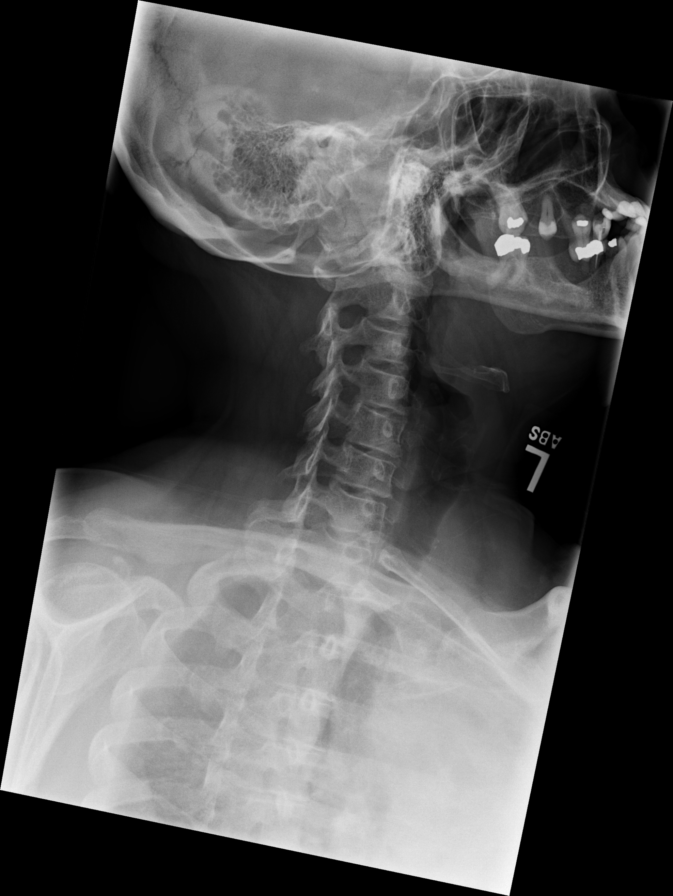
[im 3/7]
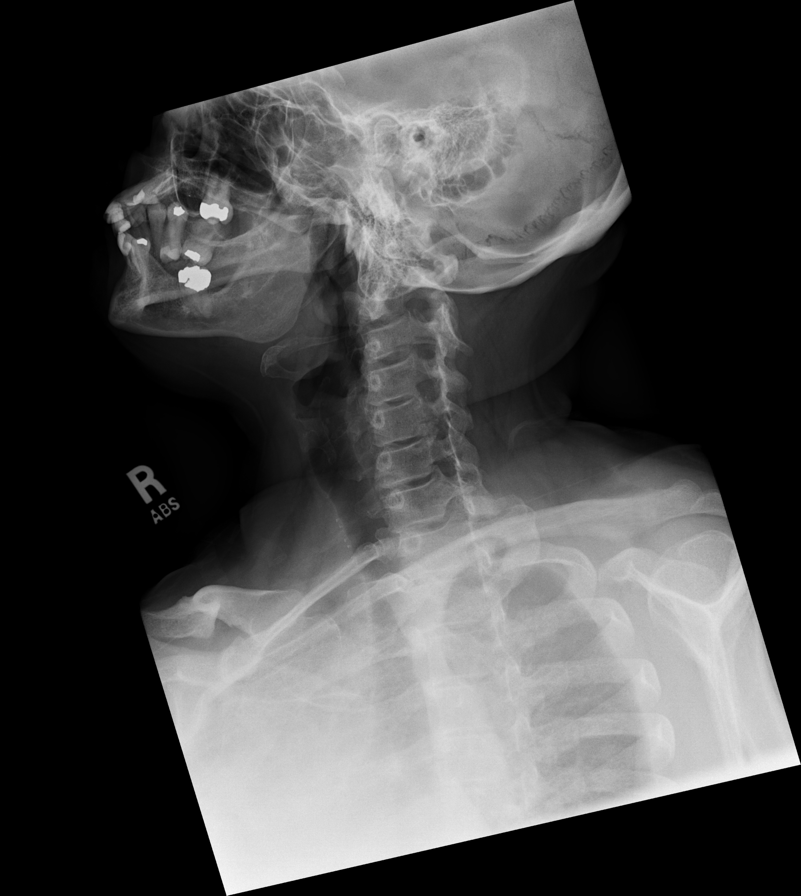
[im 4/7]
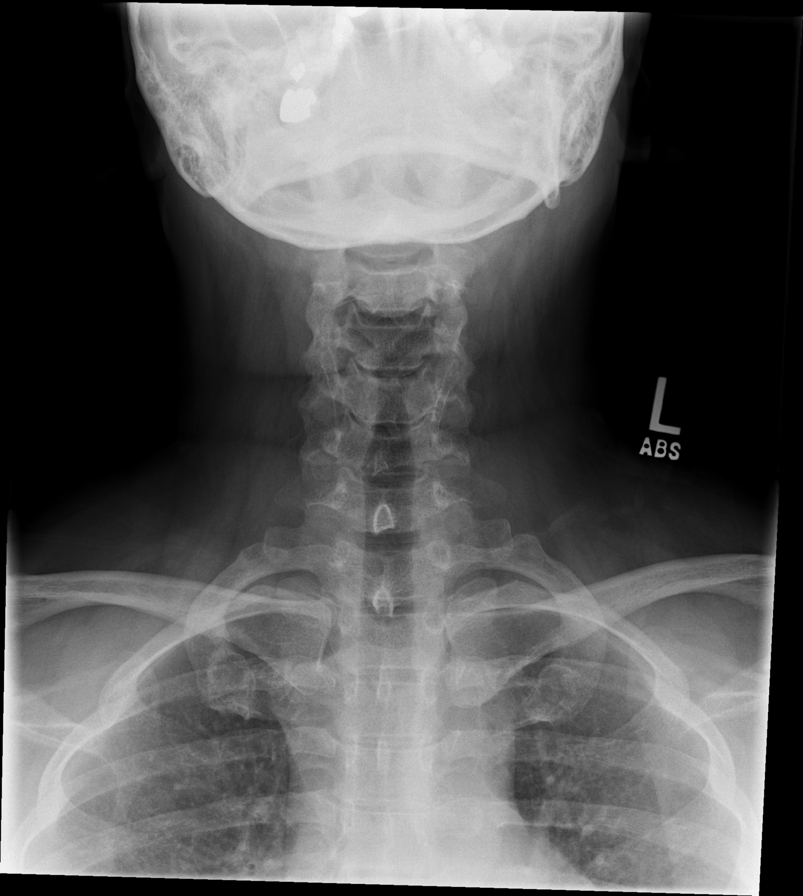
[im 5/7]
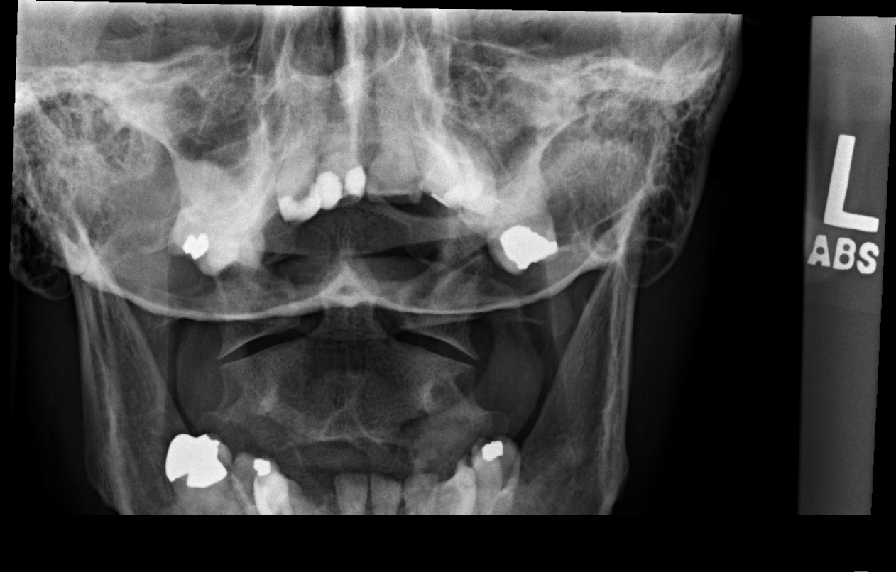
[im 6/7]
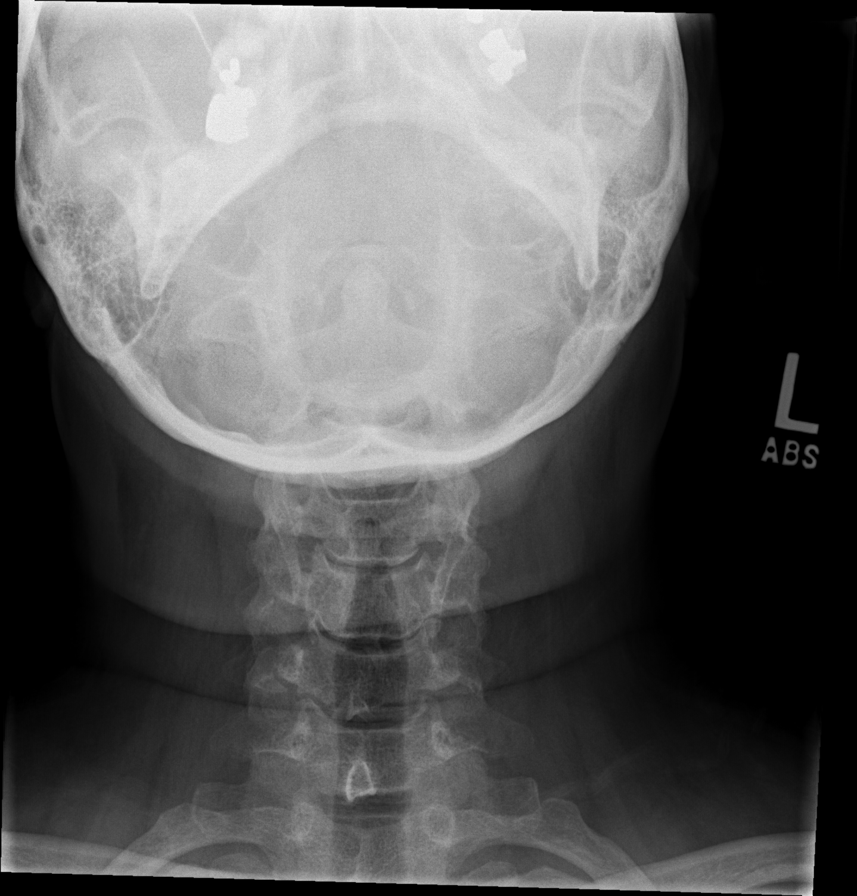
[im 7/7]
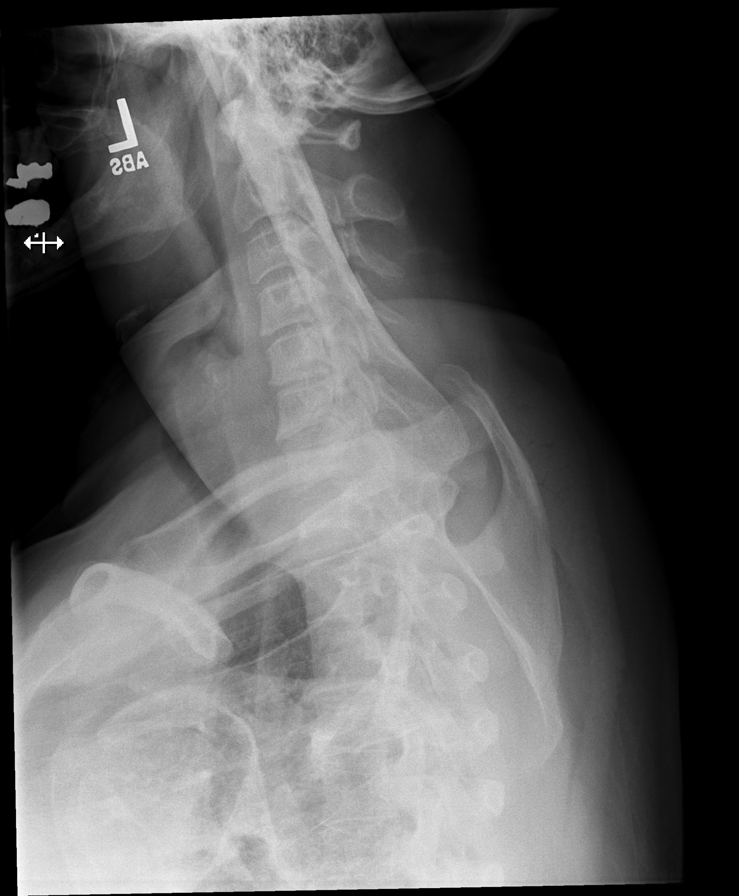

[7 of 7 positions shown; findings below may reference images not displayed]

FINDINGS: The cervical spine is imaged from the skull base through C7. C7-T1 is not
well seen. There is mild intervertebral disc height loss and osteophytosis
at C4-C5, C5-C6, and C6-C7. There is normal alignment. The neural foramina
are patent. Mild uncovertebral hypertrophy seen in the mid and lower
cervical spine.
IMPRESSION: 1. No cervical spine fracture seen. If there is continued clinical concern
for occult cervical spine fracture, further evaluation with CT is
recommended.
2. Mild multilevel degenerative disc disease.

## 2013-08-18 DIAGNOSIS — J309 Allergic rhinitis, unspecified: Secondary | ICD-10-CM | POA: Diagnosis not present

## 2013-08-25 ENCOUNTER — Other Ambulatory Visit: Payer: Self-pay | Admitting: *Deleted

## 2013-08-25 DIAGNOSIS — R109 Unspecified abdominal pain: Secondary | ICD-10-CM | POA: Diagnosis not present

## 2013-08-25 MED ORDER — HYDROCODONE-ACETAMINOPHEN 10-325 MG PO TABS: ORAL_TABLET | ORAL | Status: DC

## 2013-08-25 NOTE — Telephone Encounter (Signed)
rx printed early for controlled medication for the visit with RN on 08/31/13 (to be signed by MD) 

## 2013-08-31 ENCOUNTER — Encounter: Payer: Self-pay | Admitting: *Deleted

## 2013-08-31 ENCOUNTER — Encounter: Payer: Medicare Other | Attending: Physical Medicine & Rehabilitation | Admitting: *Deleted

## 2013-08-31 ENCOUNTER — Telehealth: Payer: Self-pay | Admitting: *Deleted

## 2013-08-31 VITALS — BP 140/90 | HR 94 | Resp 14 | Ht 63.0 in | Wt 215.0 lb

## 2013-08-31 DIAGNOSIS — F329 Major depressive disorder, single episode, unspecified: Secondary | ICD-10-CM

## 2013-08-31 DIAGNOSIS — M47816 Spondylosis without myelopathy or radiculopathy, lumbar region: Secondary | ICD-10-CM

## 2013-08-31 DIAGNOSIS — M5137 Other intervertebral disc degeneration, lumbosacral region: Secondary | ICD-10-CM | POA: Diagnosis not present

## 2013-08-31 DIAGNOSIS — IMO0001 Reserved for inherently not codable concepts without codable children: Secondary | ICD-10-CM

## 2013-08-31 DIAGNOSIS — M51379 Other intervertebral disc degeneration, lumbosacral region without mention of lumbar back pain or lower extremity pain: Secondary | ICD-10-CM | POA: Insufficient documentation

## 2013-08-31 DIAGNOSIS — F32A Depression, unspecified: Secondary | ICD-10-CM

## 2013-08-31 NOTE — Patient Instructions (Signed)
Follow up in one month or sooner with PA or Dr Riley Kill for refill and leg cramps

## 2013-08-31 NOTE — Progress Notes (Signed)
Here for pill count and medication refills. Hydrocodone 10-325 # 120 Fill date 08/03/13   Today NV# 40 appropriate She is complaining of cramps in her right lower leg and has been especially bad in the last 5 days.  She was concerned about having blood clot like her father has. I had her remove her shoes and socks.  Her legs are symetrical, 13.75 in around largest part of calf with no sign of swelling. Homans sign negative- no increased pain when foot flexed upward and pedal and posterior tibial pulses 2+. No edema or redness. Suggested she try a magnesium suppliment, she has just gotten one to take.  No diuretics that would deplete electrolytes.  Epsom salt soak to see if that would help calm cramps.  She has a card from [facility] for her tens unit supplies and the coding was incorrect and they could not pay for it, so new paperwork needs to be filled out.  I suggested that she see our PA Pam or Dr Riley Kill next visit to follow up on her leg/hip.

## 2013-08-31 NOTE — Telephone Encounter (Signed)
Itza has a card from [facility] for her tens unit supplies and the coding was incorrect and they could not pay for it, so new paperwork needs to be filled out.

## 2013-08-31 NOTE — Telephone Encounter (Signed)
It's hard for me to make any recommendations since i didn't see the leg. As long as it wasn't edematous or warm, the index of suspicion should be low.

## 2013-08-31 NOTE — Telephone Encounter (Signed)
There was no redness and did not appear to be suspicious.  She knows to all if more problems.

## 2013-08-31 NOTE — Telephone Encounter (Signed)
Please see my note from RN visit.  She was complaining of her leg having severe cramps and pain and was worried because her father has had DVTs.  I did not see an indication but look at documentation.

## 2013-09-01 DIAGNOSIS — R5381 Other malaise: Secondary | ICD-10-CM | POA: Diagnosis not present

## 2013-09-01 DIAGNOSIS — L719 Rosacea, unspecified: Secondary | ICD-10-CM | POA: Diagnosis not present

## 2013-09-01 DIAGNOSIS — J309 Allergic rhinitis, unspecified: Secondary | ICD-10-CM | POA: Diagnosis not present

## 2013-09-02 ENCOUNTER — Ambulatory Visit: Payer: Medicare Other | Admitting: Neurology

## 2013-09-02 ENCOUNTER — Telehealth: Payer: Self-pay | Admitting: Neurology

## 2013-09-02 NOTE — Telephone Encounter (Signed)
This patient did not show for the revisit appointment today. 

## 2013-09-10 DIAGNOSIS — B079 Viral wart, unspecified: Secondary | ICD-10-CM | POA: Diagnosis not present

## 2013-09-10 DIAGNOSIS — F4001 Agoraphobia with panic disorder: Secondary | ICD-10-CM | POA: Diagnosis not present

## 2013-09-10 DIAGNOSIS — J069 Acute upper respiratory infection, unspecified: Secondary | ICD-10-CM | POA: Diagnosis not present

## 2013-09-15 DIAGNOSIS — J309 Allergic rhinitis, unspecified: Secondary | ICD-10-CM | POA: Diagnosis not present

## 2013-09-24 DIAGNOSIS — J019 Acute sinusitis, unspecified: Secondary | ICD-10-CM | POA: Diagnosis not present

## 2013-09-24 DIAGNOSIS — N3010 Interstitial cystitis (chronic) without hematuria: Secondary | ICD-10-CM | POA: Diagnosis not present

## 2013-09-24 DIAGNOSIS — R894 Abnormal immunological findings in specimens from other organs, systems and tissues: Secondary | ICD-10-CM | POA: Diagnosis not present

## 2013-09-24 DIAGNOSIS — G43009 Migraine without aura, not intractable, without status migrainosus: Secondary | ICD-10-CM | POA: Diagnosis not present

## 2013-09-30 ENCOUNTER — Ambulatory Visit: Payer: Medicare Other | Admitting: Physical Medicine and Rehabilitation

## 2013-10-01 ENCOUNTER — Encounter: Payer: Medicare Other | Attending: Physical Medicine & Rehabilitation | Admitting: Physical Medicine & Rehabilitation

## 2013-10-01 ENCOUNTER — Encounter: Payer: Self-pay | Admitting: Physical Medicine & Rehabilitation

## 2013-10-01 VITALS — BP 136/80 | HR 98 | Resp 14 | Ht 63.0 in | Wt 221.2 lb

## 2013-10-01 DIAGNOSIS — R21 Rash and other nonspecific skin eruption: Secondary | ICD-10-CM | POA: Diagnosis not present

## 2013-10-01 DIAGNOSIS — G43809 Other migraine, not intractable, without status migrainosus: Secondary | ICD-10-CM | POA: Insufficient documentation

## 2013-10-01 DIAGNOSIS — F329 Major depressive disorder, single episode, unspecified: Secondary | ICD-10-CM

## 2013-10-01 DIAGNOSIS — G43009 Migraine without aura, not intractable, without status migrainosus: Secondary | ICD-10-CM

## 2013-10-01 DIAGNOSIS — G4733 Obstructive sleep apnea (adult) (pediatric): Secondary | ICD-10-CM | POA: Diagnosis not present

## 2013-10-01 DIAGNOSIS — E785 Hyperlipidemia, unspecified: Secondary | ICD-10-CM | POA: Insufficient documentation

## 2013-10-01 DIAGNOSIS — I10 Essential (primary) hypertension: Secondary | ICD-10-CM | POA: Insufficient documentation

## 2013-10-01 DIAGNOSIS — M47816 Spondylosis without myelopathy or radiculopathy, lumbar region: Secondary | ICD-10-CM

## 2013-10-01 DIAGNOSIS — F32A Depression, unspecified: Secondary | ICD-10-CM

## 2013-10-01 DIAGNOSIS — M47817 Spondylosis without myelopathy or radiculopathy, lumbosacral region: Secondary | ICD-10-CM

## 2013-10-01 DIAGNOSIS — IMO0001 Reserved for inherently not codable concepts without codable children: Secondary | ICD-10-CM

## 2013-10-01 DIAGNOSIS — M545 Low back pain, unspecified: Secondary | ICD-10-CM | POA: Insufficient documentation

## 2013-10-01 DIAGNOSIS — F411 Generalized anxiety disorder: Secondary | ICD-10-CM | POA: Diagnosis not present

## 2013-10-01 DIAGNOSIS — F3289 Other specified depressive episodes: Secondary | ICD-10-CM

## 2013-10-01 DIAGNOSIS — Z79899 Other long term (current) drug therapy: Secondary | ICD-10-CM | POA: Diagnosis not present

## 2013-10-01 MED ORDER — HYDROCODONE-ACETAMINOPHEN 10-325 MG PO TABS: ORAL_TABLET | ORAL | Status: DC

## 2013-10-01 NOTE — Progress Notes (Signed)
Subjective:    Patient ID: Karina Greer, female    DOB: 05/23/1966, 48 y.o.   MRN: [id number]  HPI  Karina Greer is back regarding her FMS. Things have been fairly steady over the last year. She is complaining of more low back pain. She has MRI evidence of sponydlosis. She continues to be followed by numerous specialists for her mood, OSA, skin, bladder, medical needs.   She had lost weight this past year until the holidays where she gained some of it back. She is wearing her CPAP at night which has helped her sleep for the most part.     Pain Inventory Average Pain 6 Pain Right Now 9 My pain is constant, sharp and tingling  In the last 24 hours, has pain interfered with the following? General activity 4 Relation with others 7 Enjoyment of life 7 What TIME of day is your pain at its worst? all day Sleep (in general) Fair  Pain is worse with: walking, bending, sitting, inactivity, standing and some activites Pain improves with: rest, heat/ice, therapy/exercise, pacing activities, medication and TENS Relief from Meds: 6  Mobility walk without assistance how many minutes can you walk? 1/2 ability to climb steps?  yes do you drive?  yes transfers alone Do you have any goals in this area?  yes  Function disabled: date disabled 06/06 I need assistance with the following:  meal prep, household duties and shopping Do you have any goals in this area?  yes  Neuro/Psych bladder control problems weakness numbness tremor tingling spasms dizziness confusion depression anxiety loss of taste or smell suicidal thoughts  Prior Studies Any changes since last visit?  no  Physicians involved in your care Any changes since last visit?  no   Family History  Problem Relation Age of Onset  . Hypertension Mother   . Diabetes Father   . Fibromyalgia Sister   . Hypertension Other     Cancer, Cerebrovascular disease run on mother side of family   History   Social History  .  Marital Status: Legally Separated    Spouse Name: N/A    Number of Children: 3  . Years of Education: HS   Occupational History  . Unemployed     Disability   Social History Main Topics  . Smoking status: Never Smoker   . Smokeless tobacco: Never Used  . Alcohol Use: No  . Drug Use: No  . Sexual Activity: None   Other Topics Concern  . None   Social History Narrative  . None   Past Surgical History  Procedure Laterality Date  . Cholecystectomy    . Tubal ligation Bilateral   . Shoulder surgery Right   . Ovarian biopsy    . Gallbladder surgery  02/14   Past Medical History  Diagnosis Date  . Fibromyalgia   . Depression   . Anxiety   . Abnormal gait   . Thoracolumbar back pain   . Hypertension   . Dyslipidemia   . Arthritis   . Morbid obesity   . Obstructive sleep apnea   . GERD (gastroesophageal reflux disease)   . Chronic low back pain   . History of renal calculi   . RLS (restless legs syndrome)   . Interstitial cystitis   . IBS (irritable bowel syndrome)   . Pneumonia 2013,2014    x2  . 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 02/23/2013   BP 136/80  Pulse 98  Resp 14  Ht _0  (  1.6 m)  Wt 221 lb 3.2 oz (100.336 kg)  BMI 39.19 kg/m2  SpO2 98%     Review of Systems  Constitutional: Positive for appetite change and unexpected weight change.  HENT:       Loss of taste or smell  Respiratory: Positive for apnea, cough, shortness of breath and wheezing.   Cardiovascular: Positive for leg swelling.  Gastrointestinal: Positive for nausea, vomiting, abdominal pain and diarrhea.  Genitourinary: Positive for dysuria.       Bladder control problems  Musculoskeletal: Positive for back pain and myalgias.  Skin: Positive for rash.  Neurological: Positive for dizziness, tremors, weakness and numbness.       Tingling, spasms  Psychiatric/Behavioral: Positive for suicidal ideas, confusion and dysphoric mood. The patient is nervous/anxious.   All other  systems reviewed and are negative.       Objective:   Physical Exam  Constitutional: She is oriented to person, place, and time. She appears well-developed and well-nourished.  She is morbidly obese but appears to have lost weight.  HENT:  Head: Normocephalic and atraumatic.  Eyes: Conjunctivae and EOM are normal. Pupils are equal, round, and reactive to light.  Neck: Normal range of motion.  Cardiovascular: Normal rate and regular rhythm.  Pulmonary/Chest: Effort normal and breath sounds normal.  Abdominal: Bowel sounds are normal.  Musculoskeletal:  Lumbar back:   Right lumbar paraspinals are taut. Slight elevation of the right hemipelvis as well.  Patient can flex to about 75 degrees with mild pain. She was able to extend to 15 degrees with more severe pain. Facet maneuvers are still positive bilaterally producing pain at the L4-S1.  Neurological: She is alert and oriented to person, place, and time. She has normal strength. No cranial nerve deficit or sensory deficit. She displays a negative Romberg sign. STM and attentional deficits  Psych:  anxious at times. Fair attention and awareness. Mood is generally pleasant   Assessment & Plan:   ASSESSMENT:  1. Fibromyalgia.  2. Anxiety and depression.  3. History of low back pain. Mild facet disease on MRI and exam is consistent  4. OSA 5. Migraine headaches due to FMS, but has history of WML on MRI. (neurology is following)   PLAN:  1. Refilled hydrocodone today  2. Continue to work on weight loss measures. Provided pilates and lumbar spine exercises today. May benefit from shoe lift on left, but will hold off on this for now. TENS unit is on order. I want her to increase her aerobic exercise as well.  3. Continue psychiatry and psychology follow up as her mood cycles and remains a prominent issues. I encouraged her to try the 11m mg dose of her celexa. 4. CPAP per pulmonolgy 5. She had one +ANA and a negative ANA---would hold  off on rheum referral given negative work up in the past. I would recommend a repeat ANA,RF, ESR, CRP in the spring however.  6. Follow up in one month with RN. I will see her back in about 2 months.

## 2013-10-01 NOTE — Patient Instructions (Signed)
I WOULD RECOMMEND CHECKING ANOTHER ANA, RHEUMATOID FACTOR, ESR, AND CRP IN ABOUT 3 MONTHS.  WORK ON INCREASING YOUR WALKING  FOLLOW THE PILATES PRINCIPLES AND EXERCISES I PROVIDED YOU  INCREASE YOU CELEXA TO 40MG DAILY

## 2013-10-04 DIAGNOSIS — J309 Allergic rhinitis, unspecified: Secondary | ICD-10-CM | POA: Diagnosis not present

## 2013-10-11 DIAGNOSIS — B079 Viral wart, unspecified: Secondary | ICD-10-CM | POA: Diagnosis not present

## 2013-10-14 DIAGNOSIS — K7689 Other specified diseases of liver: Secondary | ICD-10-CM | POA: Diagnosis not present

## 2013-10-14 DIAGNOSIS — K591 Functional diarrhea: Secondary | ICD-10-CM | POA: Diagnosis not present

## 2013-10-20 DIAGNOSIS — J309 Allergic rhinitis, unspecified: Secondary | ICD-10-CM | POA: Diagnosis not present

## 2013-10-26 ENCOUNTER — Other Ambulatory Visit: Payer: Self-pay | Admitting: *Deleted

## 2013-10-26 MED ORDER — HYDROCODONE-ACETAMINOPHEN 10-325 MG PO TABS: ORAL_TABLET | ORAL | Status: DC

## 2013-10-26 NOTE — Telephone Encounter (Signed)
RX printed early for controlled medication for the visit with RN on 11/01/13 (to be signed by MD) 

## 2013-10-28 DIAGNOSIS — R21 Rash and other nonspecific skin eruption: Secondary | ICD-10-CM | POA: Diagnosis not present

## 2013-10-28 DIAGNOSIS — E669 Obesity, unspecified: Secondary | ICD-10-CM | POA: Diagnosis not present

## 2013-10-28 DIAGNOSIS — R51 Headache: Secondary | ICD-10-CM | POA: Diagnosis not present

## 2013-11-01 DIAGNOSIS — N393 Stress incontinence (female) (male): Secondary | ICD-10-CM | POA: Diagnosis not present

## 2013-11-01 DIAGNOSIS — N318 Other neuromuscular dysfunction of bladder: Secondary | ICD-10-CM | POA: Diagnosis not present

## 2013-11-01 DIAGNOSIS — N3010 Interstitial cystitis (chronic) without hematuria: Secondary | ICD-10-CM | POA: Diagnosis not present

## 2013-11-02 ENCOUNTER — Encounter: Payer: Medicare Other | Attending: Physical Medicine & Rehabilitation | Admitting: *Deleted

## 2013-11-02 ENCOUNTER — Encounter: Payer: Self-pay | Admitting: *Deleted

## 2013-11-02 VITALS — BP 147/86 | HR 76 | Resp 14 | Wt 222.2 lb

## 2013-11-02 DIAGNOSIS — G2581 Restless legs syndrome: Secondary | ICD-10-CM | POA: Diagnosis not present

## 2013-11-02 DIAGNOSIS — G4733 Obstructive sleep apnea (adult) (pediatric): Secondary | ICD-10-CM | POA: Insufficient documentation

## 2013-11-02 DIAGNOSIS — F3289 Other specified depressive episodes: Secondary | ICD-10-CM | POA: Diagnosis not present

## 2013-11-02 DIAGNOSIS — M545 Low back pain, unspecified: Secondary | ICD-10-CM | POA: Insufficient documentation

## 2013-11-02 DIAGNOSIS — IMO0001 Reserved for inherently not codable concepts without codable children: Secondary | ICD-10-CM | POA: Insufficient documentation

## 2013-11-02 DIAGNOSIS — E785 Hyperlipidemia, unspecified: Secondary | ICD-10-CM | POA: Insufficient documentation

## 2013-11-02 DIAGNOSIS — K219 Gastro-esophageal reflux disease without esophagitis: Secondary | ICD-10-CM | POA: Insufficient documentation

## 2013-11-02 DIAGNOSIS — Z87442 Personal history of urinary calculi: Secondary | ICD-10-CM | POA: Insufficient documentation

## 2013-11-02 DIAGNOSIS — M47816 Spondylosis without myelopathy or radiculopathy, lumbar region: Secondary | ICD-10-CM

## 2013-11-02 DIAGNOSIS — F329 Major depressive disorder, single episode, unspecified: Secondary | ICD-10-CM | POA: Diagnosis not present

## 2013-11-02 DIAGNOSIS — I10 Essential (primary) hypertension: Secondary | ICD-10-CM | POA: Diagnosis not present

## 2013-11-02 DIAGNOSIS — G43909 Migraine, unspecified, not intractable, without status migrainosus: Secondary | ICD-10-CM | POA: Insufficient documentation

## 2013-11-02 DIAGNOSIS — F411 Generalized anxiety disorder: Secondary | ICD-10-CM | POA: Diagnosis not present

## 2013-11-02 NOTE — Patient Instructions (Signed)
Follow up in one month with RN for med refill

## 2013-11-02 NOTE — Progress Notes (Signed)
Here for pill count and medication refills.  Hydrocodone 10-325   #120 Fill date 10/01/13    Today NV#   30 VSS    Pain level:  6   No falls in last 6 months and her fall risk is moderate.  I have educated her and given her a handout on home fall prevention tips.  Her pill count was appropriate and refill given.  She will return in one month to see me for refills. She has concerns about  Her hair thinning and leg cramps as well as weight gain. She says that she has had her thyroid checked and it has not been abnormal.  I gave her a name of a test to that reveals hypothyroid due to presence of antibodies that she might want to talk with her other MD's about called a thyroid peroxidase antibody test. She has been suspected of having an autoimmune disorder but nothing conclusive. She will discuss this further with other physicians She does have a prominent butterfly rash on her face today. She will be following up with a neurologist very soon.

## 2013-11-04 DIAGNOSIS — G4730 Sleep apnea, unspecified: Secondary | ICD-10-CM | POA: Diagnosis not present

## 2013-11-04 DIAGNOSIS — J449 Chronic obstructive pulmonary disease, unspecified: Secondary | ICD-10-CM | POA: Diagnosis not present

## 2013-11-04 DIAGNOSIS — J4489 Other specified chronic obstructive pulmonary disease: Secondary | ICD-10-CM | POA: Diagnosis not present

## 2013-11-04 DIAGNOSIS — J309 Allergic rhinitis, unspecified: Secondary | ICD-10-CM | POA: Diagnosis not present

## 2013-11-04 DIAGNOSIS — G4710 Hypersomnia, unspecified: Secondary | ICD-10-CM | POA: Diagnosis not present

## 2013-11-05 DIAGNOSIS — R21 Rash and other nonspecific skin eruption: Secondary | ICD-10-CM | POA: Diagnosis not present

## 2013-11-05 DIAGNOSIS — M329 Systemic lupus erythematosus, unspecified: Secondary | ICD-10-CM | POA: Diagnosis not present

## 2013-11-06 DIAGNOSIS — M359 Systemic involvement of connective tissue, unspecified: Secondary | ICD-10-CM | POA: Diagnosis not present

## 2013-11-17 DIAGNOSIS — J309 Allergic rhinitis, unspecified: Secondary | ICD-10-CM | POA: Diagnosis not present

## 2013-11-17 DIAGNOSIS — J019 Acute sinusitis, unspecified: Secondary | ICD-10-CM | POA: Diagnosis not present

## 2013-11-19 ENCOUNTER — Telehealth: Payer: Self-pay

## 2013-11-19 NOTE — Telephone Encounter (Signed)
Patient wanted to let us know she has pneumonia.  She is concerned that she needs tussinex.  Advised her not to take until approved by provider.  Dr Naaman Plummer not in the office patient aware.  Please advise whether this is ok?

## 2013-11-19 NOTE — Telephone Encounter (Signed)
Informed patient she can take tussinex but should not take hydrocodone when she takes the syrup.  Patient understands and will be seen by Dr Naaman Plummer 11/23/2013.

## 2013-11-22 DIAGNOSIS — M940 Chondrocostal junction syndrome [Tietze]: Secondary | ICD-10-CM | POA: Diagnosis not present

## 2013-11-22 DIAGNOSIS — J019 Acute sinusitis, unspecified: Secondary | ICD-10-CM | POA: Diagnosis not present

## 2013-11-23 ENCOUNTER — Encounter: Payer: Self-pay | Admitting: Physical Medicine & Rehabilitation

## 2013-11-23 ENCOUNTER — Encounter: Payer: Medicare Other | Attending: Physical Medicine & Rehabilitation | Admitting: Physical Medicine & Rehabilitation

## 2013-11-23 VITALS — BP 136/90 | HR 90 | Resp 14 | Ht 62.0 in | Wt 224.0 lb

## 2013-11-23 DIAGNOSIS — F32A Depression, unspecified: Secondary | ICD-10-CM

## 2013-11-23 DIAGNOSIS — M51379 Other intervertebral disc degeneration, lumbosacral region without mention of lumbar back pain or lower extremity pain: Secondary | ICD-10-CM | POA: Insufficient documentation

## 2013-11-23 DIAGNOSIS — G43009 Migraine without aura, not intractable, without status migrainosus: Secondary | ICD-10-CM | POA: Diagnosis not present

## 2013-11-23 DIAGNOSIS — M5137 Other intervertebral disc degeneration, lumbosacral region: Secondary | ICD-10-CM | POA: Insufficient documentation

## 2013-11-23 DIAGNOSIS — F329 Major depressive disorder, single episode, unspecified: Secondary | ICD-10-CM | POA: Diagnosis not present

## 2013-11-23 DIAGNOSIS — F3289 Other specified depressive episodes: Secondary | ICD-10-CM

## 2013-11-23 DIAGNOSIS — IMO0001 Reserved for inherently not codable concepts without codable children: Secondary | ICD-10-CM

## 2013-11-23 DIAGNOSIS — E669 Obesity, unspecified: Secondary | ICD-10-CM

## 2013-11-23 MED ORDER — HYDROCODONE-ACETAMINOPHEN 10-325 MG PO TABS: ORAL_TABLET | ORAL | Status: DC

## 2013-11-23 NOTE — Progress Notes (Signed)
Subjective:    Patient ID: Karina Greer, female    DOB: 02-03-1966, 48 y.o.   MRN: [id number]  HPI  Eh is back regarding her chronic pain. She complains of being "sick" recently. She felt that she was getting a pneumonia. Her pcp placed her on abx which had varied effect. She finally feels that the symptoms are getting better. She also received a steroid injection which helped the most.  She may have developed some costochondritis as a result.   She is using her CPAP.   Her exercise has been limited due to the weather and her health issues.   Panic attacks are still a problem.   She was placed on pamelor for headaches, but the medication caused nightmares.     Pain Inventory Average Pain 8 Pain Right Now 5 My pain is sharp, burning, dull, stabbing, tingling and aching  In the last 24 hours, has pain interfered with the following? General activity 5 Relation with others 5 Enjoyment of life 5 What TIME of day is your pain at its worst? all day Sleep (in general) Fair  Pain is worse with: na Pain improves with: rest, heat/ice, therapy/exercise, pacing activities, medication and TENS Relief from Meds: 8  Mobility walk without assistance how many minutes can you walk? 15 ability to climb steps?  yes do you drive?  yes transfers alone  Function disabled: date disabled na I need assistance with the following:  meal prep, household duties and shopping Do you have any goals in this area?  yes  Neuro/Psych bladder control problems weakness numbness tingling dizziness depression anxiety  Prior Studies Any changes since last visit?  no  Physicians involved in your care Any changes since last visit?  no   Family History  Problem Relation Age of Onset  . Hypertension Mother   . Diabetes Father   . Fibromyalgia Sister   . Hypertension Other     Cancer, Cerebrovascular disease run on mother side of family   History   Social History  . Marital Status:  Legally Separated    Spouse Name: N/A    Number of Children: 3  . Years of Education: HS   Occupational History  . Unemployed     Disability   Social History Main Topics  . Smoking status: Never Smoker   . Smokeless tobacco: Never Used  . Alcohol Use: No  . Drug Use: No  . Sexual Activity: None   Other Topics Concern  . None   Social History Narrative  . None   Past Surgical History  Procedure Laterality Date  . Cholecystectomy    . Tubal ligation Bilateral   . Shoulder surgery Right   . Ovarian biopsy    . Gallbladder surgery  02/14   Past Medical History  Diagnosis Date  . Fibromyalgia   . Depression   . Anxiety   . Abnormal gait   . Thoracolumbar back pain   . Hypertension   . Dyslipidemia   . Arthritis   . Morbid obesity   . Obstructive sleep apnea   . GERD (gastroesophageal reflux disease)   . Chronic low back pain   . History of renal calculi   . RLS (restless legs syndrome)   . Interstitial cystitis   . IBS (irritable bowel syndrome)   . Pneumonia 2013,2014    x2  . 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 02/23/2013   BP 136/90  Pulse 90  Resp 14  Ht 5\' 2"  (  1.575 m)  Wt 224 lb (101.606 kg)  BMI 40.96 kg/m2  SpO2 99%  Opioid Risk Score:   Fall Risk Score:      Review of Systems  Constitutional: Positive for appetite change.  Respiratory: Positive for cough, shortness of breath and wheezing.   Gastrointestinal: Positive for nausea, abdominal pain and diarrhea.  Genitourinary:       Bladder control problems  Musculoskeletal: Positive for back pain.  Skin: Positive for rash.  Neurological: Positive for dizziness, weakness and numbness.       Tingling  Psychiatric/Behavioral: Positive for dysphoric mood. The patient is nervous/anxious.   All other systems reviewed and are negative.       Objective:   Physical Exam  Constitutional: She is oriented to person, place, and time. She appears well-developed and well-nourished.  She  remains overweight HENT:  Head: Normocephalic and atraumatic.  Eyes: Conjunctivae and EOM are normal. Pupils are equal, round, and reactive to light.  Neck: Normal range of motion.  Cardiovascular: Normal rate and regular rhythm.  Pulmonary/Chest: Effort normal and breath sounds normal.  Abdominal: Bowel sounds are normal.  Musculoskeletal:  Lumbar back: Right lumbar paraspinals are taut. Slight elevation of the right hemipelvis as well.  Patient can flex to about 80 degrees with mild pain. She was able to extend to 20 degrees with more severe pain. Facet maneuvers are still positive bilaterally producing pain at the L4-S1.  Neurological: She is alert and oriented to person, place, and time. She has normal strength. No cranial nerve deficit or sensory deficit. She displays a negative Romberg sign. STM and attentional deficits  Psych:   Fair attention and awareness. Mood is generally pleasant  Assessment & Plan:   ASSESSMENT:  1. Fibromyalgia.  2. Anxiety and depression.  3. History of low back pain. Mild facet disease on MRI and exam is consistent  4. OSA  5. Migraine headaches due to FMS, but has history of WML on MRI. (neurology is following)    PLAN:  1. Refilled hydrocodone today for next month. 2. Discussed the importance of exercise in treating her pain Still waiting on TENS unit. Asked her to follow up with company to see what the problem is.   3. Think that the celexa would be positive at a 40mg  dose  4. CPAP per pulmonology. Discussed use of cough medicines with patient.  5. No further rheum work up at this point.  6. Follow up in two months with PA. I will see her back in about 3 months.

## 2013-11-23 NOTE — Patient Instructions (Signed)
PLEASE CALL ME WITH ANY PROBLEMS OR QUESTIONS (#[phone]).      PLEASE WORK ON CARDIO EXERCISE AND STRENGTH TRAINING

## 2013-11-25 DIAGNOSIS — R51 Headache: Secondary | ICD-10-CM | POA: Diagnosis not present

## 2013-11-25 DIAGNOSIS — E669 Obesity, unspecified: Secondary | ICD-10-CM | POA: Diagnosis not present

## 2013-11-26 DIAGNOSIS — F4001 Agoraphobia with panic disorder: Secondary | ICD-10-CM | POA: Diagnosis not present

## 2013-12-02 DIAGNOSIS — J309 Allergic rhinitis, unspecified: Secondary | ICD-10-CM | POA: Diagnosis not present

## 2013-12-16 DIAGNOSIS — J309 Allergic rhinitis, unspecified: Secondary | ICD-10-CM | POA: Diagnosis not present

## 2013-12-31 DIAGNOSIS — J309 Allergic rhinitis, unspecified: Secondary | ICD-10-CM | POA: Diagnosis not present

## 2014-01-12 DIAGNOSIS — R209 Unspecified disturbances of skin sensation: Secondary | ICD-10-CM | POA: Diagnosis not present

## 2014-01-12 DIAGNOSIS — R51 Headache: Secondary | ICD-10-CM | POA: Diagnosis not present

## 2014-01-12 DIAGNOSIS — E669 Obesity, unspecified: Secondary | ICD-10-CM | POA: Diagnosis not present

## 2014-01-12 DIAGNOSIS — M542 Cervicalgia: Secondary | ICD-10-CM | POA: Diagnosis not present

## 2014-01-14 DIAGNOSIS — R143 Flatulence: Secondary | ICD-10-CM | POA: Diagnosis not present

## 2014-01-14 DIAGNOSIS — R141 Gas pain: Secondary | ICD-10-CM | POA: Diagnosis not present

## 2014-01-14 DIAGNOSIS — J309 Allergic rhinitis, unspecified: Secondary | ICD-10-CM | POA: Diagnosis not present

## 2014-01-14 DIAGNOSIS — K589 Irritable bowel syndrome without diarrhea: Secondary | ICD-10-CM | POA: Diagnosis not present

## 2014-01-14 DIAGNOSIS — K591 Functional diarrhea: Secondary | ICD-10-CM | POA: Diagnosis not present

## 2014-01-14 DIAGNOSIS — R110 Nausea: Secondary | ICD-10-CM | POA: Diagnosis not present

## 2014-01-19 DIAGNOSIS — M545 Low back pain, unspecified: Secondary | ICD-10-CM | POA: Diagnosis not present

## 2014-01-19 DIAGNOSIS — R110 Nausea: Secondary | ICD-10-CM | POA: Diagnosis not present

## 2014-01-19 DIAGNOSIS — L719 Rosacea, unspecified: Secondary | ICD-10-CM | POA: Diagnosis not present

## 2014-01-19 DIAGNOSIS — N3010 Interstitial cystitis (chronic) without hematuria: Secondary | ICD-10-CM | POA: Diagnosis not present

## 2014-01-19 DIAGNOSIS — R1012 Left upper quadrant pain: Secondary | ICD-10-CM | POA: Diagnosis not present

## 2014-01-20 ENCOUNTER — Encounter: Payer: Medicare Other | Attending: Physical Medicine & Rehabilitation | Admitting: Registered Nurse

## 2014-01-20 ENCOUNTER — Encounter: Payer: Self-pay | Admitting: Registered Nurse

## 2014-01-20 VITALS — BP 129/87 | HR 70 | Resp 14 | Ht 63.0 in | Wt 224.0 lb

## 2014-01-20 DIAGNOSIS — M5137 Other intervertebral disc degeneration, lumbosacral region: Secondary | ICD-10-CM | POA: Diagnosis not present

## 2014-01-20 DIAGNOSIS — Z5181 Encounter for therapeutic drug level monitoring: Secondary | ICD-10-CM

## 2014-01-20 DIAGNOSIS — IMO0001 Reserved for inherently not codable concepts without codable children: Secondary | ICD-10-CM

## 2014-01-20 DIAGNOSIS — F329 Major depressive disorder, single episode, unspecified: Secondary | ICD-10-CM | POA: Diagnosis not present

## 2014-01-20 DIAGNOSIS — F32A Depression, unspecified: Secondary | ICD-10-CM

## 2014-01-20 DIAGNOSIS — G43009 Migraine without aura, not intractable, without status migrainosus: Secondary | ICD-10-CM

## 2014-01-20 DIAGNOSIS — Z79899 Other long term (current) drug therapy: Secondary | ICD-10-CM

## 2014-01-20 DIAGNOSIS — M51379 Other intervertebral disc degeneration, lumbosacral region without mention of lumbar back pain or lower extremity pain: Secondary | ICD-10-CM | POA: Insufficient documentation

## 2014-01-20 DIAGNOSIS — F3289 Other specified depressive episodes: Secondary | ICD-10-CM

## 2014-01-20 DIAGNOSIS — E669 Obesity, unspecified: Secondary | ICD-10-CM | POA: Diagnosis not present

## 2014-01-20 MED ORDER — HYDROCODONE-ACETAMINOPHEN 10-325 MG PO TABS: ORAL_TABLET | ORAL | Status: DC

## 2014-01-20 NOTE — Progress Notes (Signed)
Subjective:    Patient ID: Karina Greer, female    DOB: 1966/02/22, 48 y.o.   MRN: [id number]  HPI: [name] is a 48 year old female who retrurns for follow up for chronic pain and medication refill. She says her pain is located right lower back and left flank pain. Her left flank pain is her concerned today. She seen her PMD Dr. Tobie Lords yesterday she had blood work done to see if she has a kidney stone, she has a history of nephrolithiasis.  She rates her pain 8. Her current exercise regime is walking. She has followed up with her urologist and neurologist last week.She admits to feeling anxious today due to left side pain.  She admits she lost her footing last week coming down her porch stairs and landed on her buttocks in the mud, she is not sure if she landed on her side.  Pain Inventory Average Pain 6 Pain Right Now 8 My pain is constant, sharp, burning and stabbing  In the last 24 hours, has pain interfered with the following? General activity 5 Relation with others 6 Enjoyment of life 6 What TIME of day is your pain at its worst? all day constant Sleep (in general) Fair  Pain is worse with: walking, bending, sitting, inactivity, standing, some activites and other Pain improves with: rest, heat/ice, therapy/exercise, pacing activities, medication, TENS and other Relief from Meds: 5  Mobility walk without assistance how many minutes can you walk? 20 ability to climb steps?  yes transfers alone Do you have any goals in this area?  yes  Function disabled: date disabled na I need assistance with the following:  meal prep, household duties and shopping Do you have any goals in this area?  yes  Neuro/Psych bladder control problems numbness tingling dizziness confusion depression anxiety  Prior Studies Any changes since last visit?  no  Physicians involved in your care Any changes since last visit?  no   Family History  Problem Relation Age of Onset  .  Hypertension Mother   . Diabetes Father   . Fibromyalgia Sister   . Hypertension Other     Cancer, Cerebrovascular disease run on mother side of family   History   Social History  . Marital Status: Legally Separated    Spouse Name: N/A    Number of Children: 3  . Years of Education: HS   Occupational History  . Unemployed     Disability   Social History Main Topics  . Smoking status: Never Smoker   . Smokeless tobacco: Never Used  . Alcohol Use: No  . Drug Use: No  . Sexual Activity: None   Other Topics Concern  . None   Social History Narrative  . None   Past Surgical History  Procedure Laterality Date  . Cholecystectomy    . Tubal ligation Bilateral   . Shoulder surgery Right   . Ovarian biopsy    . Gallbladder surgery  02/14   Past Medical History  Diagnosis Date  . Fibromyalgia   . Depression   . Anxiety   . Abnormal gait   . Thoracolumbar back pain   . Hypertension   . Dyslipidemia   . Arthritis   . Morbid obesity   . Obstructive sleep apnea   . GERD (gastroesophageal reflux disease)   . Chronic low back pain   . History of renal calculi   . RLS (restless legs syndrome)   . Interstitial cystitis   .  IBS (irritable bowel syndrome)   . Pneumonia 2013,2014    x2  . 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 02/23/2013   BP 129/87  Pulse 70  Resp 14  Ht 5\' 3"  (1.6 m)  Wt 224 lb (101.606 kg)  BMI 39.69 kg/m2  SpO2 97%  Opioid Risk Score:   Fall Risk Score: Low Fall Risk (0-5 points) (pt educated and given a brochure on fall risk previously)    Review of Systems  Constitutional: Positive for appetite change.  Respiratory: Positive for apnea and shortness of breath.   Gastrointestinal: Positive for nausea, abdominal pain and diarrhea.  Genitourinary:       Bladder control problems  Musculoskeletal: Positive for back pain.  Neurological: Positive for dizziness and numbness.       Tingling  Psychiatric/Behavioral: Positive for confusion. The  patient is nervous/anxious.        Depression  All other systems reviewed and are negative.      Objective:   Physical Exam  Nursing note and vitals reviewed. Constitutional: She is oriented to person, place, and time. She appears well-developed and well-nourished.  HENT:  Head: Normocephalic and atraumatic.  Neck: Normal range of motion. Neck supple.  Cardiovascular: Normal rate, regular rhythm and normal heart sounds.   Pulmonary/Chest: Effort normal and breath sounds normal.  Musculoskeletal:  Normal Muscle Bulk: Muscle Testing Reveals: Upper extremities Muscle Strength 5/5. Full ROM Left Flank Tenderness Noted Oblique muscles Lower Extremities: Left: Full ROM and Muscle Strength 5/5. Right: Flexion: Produce Pain to lower Back Narrow based Gait. Arises from chair with Ease  Neurological: She is alert and oriented to person, place, and time.  Skin: Skin is warm and dry.  Psychiatric: She has a normal mood and affect.  Anxious           Assessment & Plan:  1. Fibromyalgia.  Continue Current Medication Regime 2. Anxiety and depression:Psychiatry Following    3. History of low back pain. Refilled: HYDROcodone 10/325mg  one tablet every 6 hours as needed #120 4. OSA : Continue voltaren gel, exercise regime and losing weight.   5. Muscle Strain: PMD Following/ Continue to Monitor    20 minutes of face to face patient care time was spent during this visit. All questions were encouraged and answered.  F/U in 1 month

## 2014-01-21 ENCOUNTER — Encounter (HOSPITAL_BASED_OUTPATIENT_CLINIC_OR_DEPARTMENT_OTHER): Payer: Self-pay | Admitting: *Deleted

## 2014-01-21 ENCOUNTER — Other Ambulatory Visit: Payer: Self-pay | Admitting: Urology

## 2014-01-24 ENCOUNTER — Encounter (HOSPITAL_BASED_OUTPATIENT_CLINIC_OR_DEPARTMENT_OTHER): Payer: Self-pay | Admitting: *Deleted

## 2014-01-24 DIAGNOSIS — N943 Premenstrual tension syndrome: Secondary | ICD-10-CM | POA: Diagnosis not present

## 2014-01-24 DIAGNOSIS — Z01419 Encounter for gynecological examination (general) (routine) without abnormal findings: Secondary | ICD-10-CM | POA: Diagnosis not present

## 2014-01-24 NOTE — Progress Notes (Signed)
NPO AFTER MN. ARRIVE AT 0745. NEEDS HG.  WILL TAKE XANAX, GABAPENTIN, AND BENTYL AM DOS W/ SIPS OF WATER AND IF NEEDED HYDROCODONE.

## 2014-01-25 DIAGNOSIS — M7660 Achilles tendinitis, unspecified leg: Secondary | ICD-10-CM | POA: Diagnosis not present

## 2014-01-26 ENCOUNTER — Encounter (HOSPITAL_BASED_OUTPATIENT_CLINIC_OR_DEPARTMENT_OTHER): Payer: Medicare Other | Admitting: Anesthesiology

## 2014-01-26 ENCOUNTER — Encounter (HOSPITAL_BASED_OUTPATIENT_CLINIC_OR_DEPARTMENT_OTHER): Payer: Self-pay | Admitting: *Deleted

## 2014-01-26 ENCOUNTER — Ambulatory Visit (HOSPITAL_BASED_OUTPATIENT_CLINIC_OR_DEPARTMENT_OTHER)
Admission: RE | Admit: 2014-01-26 | Discharge: 2014-01-26 | Disposition: A | Discharge status: Home / Self Care | Payer: Medicare Other | Source: Ambulatory Visit | Attending: Urology | Admitting: Urology

## 2014-01-26 ENCOUNTER — Encounter (HOSPITAL_BASED_OUTPATIENT_CLINIC_OR_DEPARTMENT_OTHER)
Admission: RE | Disposition: A | Discharge status: Home / Self Care | Payer: Self-pay | Source: Ambulatory Visit | Attending: Urology

## 2014-01-26 ENCOUNTER — Ambulatory Visit (HOSPITAL_BASED_OUTPATIENT_CLINIC_OR_DEPARTMENT_OTHER): Payer: Medicare Other | Admitting: Anesthesiology

## 2014-01-26 DIAGNOSIS — N3010 Interstitial cystitis (chronic) without hematuria: Secondary | ICD-10-CM | POA: Diagnosis not present

## 2014-01-26 DIAGNOSIS — J309 Allergic rhinitis, unspecified: Secondary | ICD-10-CM | POA: Diagnosis not present

## 2014-01-26 HISTORY — DX: Anxiety disorder, unspecified: F41.9

## 2014-01-26 HISTORY — DX: Stress incontinence (female) (male): N39.3

## 2014-01-26 HISTORY — DX: Other specified postprocedural states: Z98.890

## 2014-01-26 HISTORY — DX: Obstructive sleep apnea (adult) (pediatric): G47.33

## 2014-01-26 HISTORY — DX: Sacrococcygeal disorders, not elsewhere classified: M53.3

## 2014-01-26 HISTORY — DX: Other asthma: J45.998

## 2014-01-26 HISTORY — DX: Dependence on other enabling machines and devices: Z99.89

## 2014-01-26 HISTORY — DX: Excessive and frequent menstruation with regular cycle: N92.0

## 2014-01-26 HISTORY — PX: CYSTO WITH HYDRODISTENSION: SHX5453

## 2014-01-26 HISTORY — DX: Personal history of other mental and behavioral disorders: Z86.59

## 2014-01-26 HISTORY — DX: Other specified postprocedural states: R11.2

## 2014-01-26 LAB — POCT HEMOGLOBIN-HEMACUE: Hemoglobin: 14.6 g/dL (ref 12.0–15.0)

## 2014-01-26 SURGERY — CYSTOSCOPY, WITH BLADDER HYDRODISTENSION
Anesthesia: General | Site: Bladder

## 2014-01-26 MED ORDER — ONDANSETRON HCL 4 MG/2ML IJ SOLN
INTRAMUSCULAR | Status: DC | PRN
  Administered 2014-01-26: 4 mg via INTRAVENOUS

## 2014-01-26 MED ORDER — SCOPOLAMINE 1 MG/3DAYS TD PT72
MEDICATED_PATCH | TRANSDERMAL | Status: AC
  Filled 2014-01-26: qty 1

## 2014-01-26 MED ORDER — MIDAZOLAM HCL 5 MG/5ML IJ SOLN
INTRAMUSCULAR | Status: DC | PRN
  Administered 2014-01-26 (×2): 2 mg via INTRAVENOUS

## 2014-01-26 MED ORDER — BUPIVACAINE HCL (PF) 0.25 % IJ SOLN
INTRAMUSCULAR | Status: DC | PRN
  Administered 2014-01-26: 30 mL

## 2014-01-26 MED ORDER — CIPROFLOXACIN IN D5W 400 MG/200ML IV SOLN
400.0000 mg | INTRAVENOUS | Status: AC
  Administered 2014-01-26: 400 mg via INTRAVENOUS
  Filled 2014-01-26: qty 200

## 2014-01-26 MED ORDER — FENTANYL CITRATE 0.05 MG/ML IJ SOLN
INTRAMUSCULAR | Status: DC | PRN
  Administered 2014-01-26 (×4): 50 ug via INTRAVENOUS

## 2014-01-26 MED ORDER — LACTATED RINGERS IV SOLN
INTRAVENOUS | Status: DC
  Administered 2014-01-26: 08:00:00 via INTRAVENOUS
  Filled 2014-01-26: qty 1000

## 2014-01-26 MED ORDER — HYDROMORPHONE HCL PF 1 MG/ML IJ SOLN
0.2500 mg | INTRAMUSCULAR | Status: DC | PRN
  Filled 2014-01-26: qty 1

## 2014-01-26 MED ORDER — METOCLOPRAMIDE HCL 5 MG/ML IJ SOLN
INTRAMUSCULAR | Status: DC | PRN
  Administered 2014-01-26: 10 mg via INTRAVENOUS

## 2014-01-26 MED ORDER — DEXAMETHASONE SODIUM PHOSPHATE 4 MG/ML IJ SOLN
INTRAMUSCULAR | Status: DC | PRN
  Administered 2014-01-26: 10 mg via INTRAVENOUS

## 2014-01-26 MED ORDER — PROMETHAZINE HCL 25 MG/ML IJ SOLN
6.2500 mg | INTRAMUSCULAR | Status: DC | PRN
  Filled 2014-01-26: qty 1

## 2014-01-26 MED ORDER — MIDAZOLAM HCL 2 MG/2ML IJ SOLN
INTRAMUSCULAR | Status: AC
  Filled 2014-01-26: qty 2

## 2014-01-26 MED ORDER — SCOPOLAMINE 1 MG/3DAYS TD PT72
1.0000 | MEDICATED_PATCH | Freq: Once | TRANSDERMAL | Status: AC
  Administered 2014-01-26: 1 via TRANSDERMAL
  Filled 2014-01-26: qty 1

## 2014-01-26 MED ORDER — FENTANYL CITRATE 0.05 MG/ML IJ SOLN
INTRAMUSCULAR | Status: AC
  Filled 2014-01-26: qty 4

## 2014-01-26 MED ORDER — MEPERIDINE HCL 25 MG/ML IJ SOLN
6.2500 mg | INTRAMUSCULAR | Status: DC | PRN
  Filled 2014-01-26: qty 1

## 2014-01-26 MED ORDER — ACETAMINOPHEN 10 MG/ML IV SOLN
INTRAVENOUS | Status: DC | PRN
  Administered 2014-01-26: 1000 mg via INTRAVENOUS

## 2014-01-26 MED ORDER — PROPOFOL 10 MG/ML IV BOLUS
INTRAVENOUS | Status: DC | PRN
  Administered 2014-01-26: 200 mg via INTRAVENOUS

## 2014-01-26 MED ORDER — KETOROLAC TROMETHAMINE 30 MG/ML IJ SOLN
INTRAMUSCULAR | Status: DC | PRN
Start: 2014-01-26 — End: 2014-01-26
  Administered 2014-01-26: 30 mg via INTRAVENOUS

## 2014-01-26 MED ORDER — LIDOCAINE HCL (CARDIAC) 20 MG/ML IV SOLN
INTRAVENOUS | Status: DC | PRN
  Administered 2014-01-26: 100 mg via INTRAVENOUS

## 2014-01-26 MED ORDER — STERILE WATER FOR IRRIGATION IR SOLN
Status: DC | PRN
  Administered 2014-01-26: 3000 mL

## 2014-01-26 SURGICAL SUPPLY — 24 items
BAG DRAIN URO-CYSTO SKYTR STRL (DRAIN) ×2 IMPLANT
BAG DRN UROCATH (DRAIN) ×1
CANISTER SUCT LVC 12 LTR MEDI- (MISCELLANEOUS) ×1 IMPLANT
CATH ROBINSON RED A/P 14FR (CATHETERS) IMPLANT
CATH ROBINSON RED A/P 16FR (CATHETERS) IMPLANT
CATH ROBINSON RED A/P 18FR (CATHETERS) ×3 IMPLANT
CLOTH BEACON ORANGE TIMEOUT ST (SAFETY) ×2 IMPLANT
DRAPE CAMERA CLOSED 9X96 (DRAPES) ×2 IMPLANT
ELECT REM PT RETURN 9FT ADLT (ELECTROSURGICAL)
ELECTRODE REM PT RTRN 9FT ADLT (ELECTROSURGICAL) ×1 IMPLANT
GLOVE BIO SURGEON STRL SZ7.5 (GLOVE) ×2 IMPLANT
GLOVE BIOGEL PI IND STRL 7.5 (GLOVE) IMPLANT
GLOVE BIOGEL PI INDICATOR 7.5 (GLOVE) ×2
GOWN PREVENTION PLUS LG XLONG (DISPOSABLE) ×1 IMPLANT
GOWN STRL REIN XL XLG (GOWN DISPOSABLE) ×1 IMPLANT
GOWN STRL REUS W/TWL LRG LVL3 (GOWN DISPOSABLE) ×1 IMPLANT
GOWN STRL REUS W/TWL XL LVL3 (GOWN DISPOSABLE) ×1 IMPLANT
NEEDLE HYPO 22GX1.5 SAFETY (NEEDLE) IMPLANT
NS IRRIG 500ML POUR BTL (IV SOLUTION) IMPLANT
PACK CYSTOSCOPY (CUSTOM PROCEDURE TRAY) ×2 IMPLANT
SYR 30ML LL (SYRINGE) ×1 IMPLANT
SYR BULB IRRIGATION 50ML (SYRINGE) IMPLANT
WATER STERILE IRR 3000ML UROMA (IV SOLUTION) ×2 IMPLANT
WATER STERILE IRR 500ML POUR (IV SOLUTION) IMPLANT

## 2014-01-26 NOTE — Discharge Instructions (Signed)
Cystoscopy patient instructions ° °Following a cystoscopy, a catheter (a flexible rubber tube) is sometimes left in place to empty the bladder. This may cause some discomfort or a feeling that you need to urinate. Your doctor determines the period of time that the catheter will be left in place. °You may have bloody urine for two to three days (Call your doctor if the amount of bleeding increases or does not subside). ° °You may pass blood clots in your urine, especially if you had a biopsy. It is not unusual to pass small blood clots and have some bloody urine a couple of weeks after your cystoscopy. Again, call your doctor if the bleeding does not subside. °You may have: °Dysuria (painful urination) °Frequency (urinating often) °Urgency (strong desire to urinate) ° °These symptoms are common especially if medicine is instilled into the bladder or a ureteral stent is placed. Avoiding alcohol and caffeine, such as coffee, tea, and chocolate, may help relieve these symptoms. Drink plenty of water, unless otherwise instructed. Your doctor may also prescribe an antibiotic or other medicine to reduce these symptoms. ° °Cystoscopy results are available soon after the procedure; biopsy results usually take two to four days. Your doctor will discuss the results of your exam with you. Before you go home, you will be given specific instructions for follow-up care. °Special Instructions: ° °1  If you are going home with a catheter in place do not take a tub bath until removed by your doctor.  °2  You may resume your normal activities.  °3  Do not drive or operate machinery if you are taking narcotic pain medicine.  °4  Be sure to keep all follow-up appointments with your doctor.  ° °5 Call Your Doctor If: The catheter is not draining ° You have severe pain ° You are unable to urinate ° You have a fever over 101 ° You have severe bleeding °        ° °Post Anesthesia Home Care Instructions ° °Activity: °Get plenty of rest for  the remainder of the day. A responsible adult should stay with you for 24 hours following the procedure.  °For the next 24 hours, DO NOT: °-Drive a car °-Operate machinery °-Drink alcoholic beverages °-Take any medication unless instructed by your physician °-Make any legal decisions or sign important papers. ° °Meals: °Start with liquid foods such as gelatin or soup. Progress to regular foods as tolerated. Avoid greasy, spicy, heavy foods. If nausea and/or vomiting occur, drink only clear liquids until the nausea and/or vomiting subsides. Call your physician if vomiting continues. ° °Special Instructions/Symptoms: °Your throat may feel dry or sore from the anesthesia or the breathing tube placed in your throat during surgery. If this causes discomfort, gargle with warm salt water. The discomfort should disappear within 24 hours. ° °

## 2014-01-26 NOTE — Transfer of Care (Signed)
Immediate Anesthesia Transfer of Care Note  Patient: Karina Greer  Procedure(s) Performed: Procedure(s) (LRB): CYSTOSCOPY/HYDRODISTENSION (N/A)  Patient Location: PACU  Anesthesia Type: General  Level of Consciousness: awake, alert  and oriented  Airway & Oxygen Therapy: Patient Spontanous Breathing and Patient connected to face mask oxygen  Post-op Assessment: Report given to PACU RN and Post -op Vital signs reviewed and stable  Post vital signs: Reviewed and stable  Complications: No apparent anesthesia complications

## 2014-01-26 NOTE — Anesthesia Postprocedure Evaluation (Signed)
Anesthesia Post Note  Patient: Karina Greer  Procedure(s) Performed: Procedure(s) (LRB): CYSTOSCOPY/HYDRODISTENSION (N/A)  Anesthesia type: General  Patient location: PACU  Post pain: Pain level controlled  Post assessment: Post-op Vital signs reviewed  Last Vitals: BP 131/86  Pulse 79  Temp(Src) 36.1 C (Oral)  Resp 16  Wt 222 lb (100.699 kg)  SpO2 97%  LMP 12/25/2013  Post vital signs: Reviewed  Level of consciousness: sedated  Complications: No apparent anesthesia complications

## 2014-01-26 NOTE — H&P (Signed)
Active Problems Ms. Belcourt presents today for a 2 month follow up   History of Present Illness Ms. Schaad has a history of IC. She has had HOD in the past as well as bladder instillations. She has mixed incontinence. She has tried and failed several medications for this due to side effects. She has seen Dr. Amalia Hailey a few times and he suggested interstim but she is not interested in this.    Interval history: Ms. Petz presents today for a 2 month recheck. She saw Dr. Risa Grill in February. She was given a trial of Uribel for her IC. She has been taking this and it does seem to help some. It certainly takes the edge off of the discomfort. She does continue to have intermittent flares. She denies gross hematuria, n/v or f/c.   Vitals Vital Signs [Data Includes: Last 1 Day]  Recorded: 29Apr2015 09:26AM  Blood Pressure: 124 / 85 Temperature: 97.1 F Heart Rate: 74  Physical Exam Constitutional: Well nourished and well developed . No acute distress.  Abdomen: The abdomen is soft and nontender.    Results/Data Urine [Data Includes: Last 1 Day]   29Apr2015  COLOR YELLOW   APPEARANCE CLEAR   SPECIFIC GRAVITY 1.025   pH 5.5   GLUCOSE NEG mg/dL  BILIRUBIN NEG   KETONE NEG mg/dL  BLOOD SMALL   PROTEIN NEG mg/dL  UROBILINOGEN 0.2 mg/dL  NITRITE NEG   LEUKOCYTE ESTERASE NEG   SQUAMOUS EPITHELIAL/HPF RARE   WBC 0-2 WBC/hpf  RBC NONE SEEN RBC/hpf  BACTERIA RARE   CRYSTALS NONE SEEN   CASTS NONE SEEN    Assessment Assessed  1. Chronic interstitial cystitis without hematuria (595.1)  Ms. Titsworth is managing her symptoms with Uribel. Despite this she does have some ongoing "flares" intermittently. She is not interested in instillations as she recalls from the past that these were even more painful for her than the IC discomfort. She would like to see if Dr. Risa Grill would consider a repeat HOD. I told her I can ask. I will get back to her. In the meantime she will continue Uribel PRN.    Plan Chronic interstitial cystitis without hematuria  1. Start: Uribel 118 MG Oral Capsule; TAKE 1 CAPSULE 4 times daily PRN dysuria Health Maintenance  2. UA With REFLEX; [Do Not Release]; Status:Complete;   Done: [id number] 09:18AM  1. Uribel PRN  2. Contact Dr. Risa Grill in regards to repeat HOD   Signatures Electronically signed by : Anders Grant, NP-C; Jan 19 2014  3:06PM EST

## 2014-01-26 NOTE — Op Note (Signed)
Preoperative diagnosis:  Interstitial Cystitis Postoperative diagnosis: Same Procedure:  Cystoscopy and hydraulic overdistention of the bladder with instillation of  Marcaine Surgeon Bernestine Amass, MD Anesthesia: General  Indication: Karina Greer has a long-standing history of interstitial cystitis.She  has had a recent flare and requested repeat hydraulic overdistention of the bladder.  Technique and findings:   The patient was brought to the operating room where [facility] successful induction of general anesthesia.The patient was placed in lithotomy position and prepped and draped in usual manner. Appropriate surgical timeout was performed.The patient received perioperative antibiotics. Initial cystoscopy revealed no abnormalities of the bladder. The patient underwent hydraulic overdistention of the bladder to 100 cm of water pressure for 5 minutes. Capacity was felt to be 600 cc. The patient had diffuse 2+ glomerular bleeding noted consistent with the diagnosis of interstitial cystitis. Marcaine was then instilled in the bladder.The patient was brought to recovery room in stable condition having no obvious complications or problems.   Bernestine Amass, MD 01/26/2014, 9:13 AM

## 2014-01-26 NOTE — Anesthesia Preprocedure Evaluation (Signed)
Anesthesia Evaluation  Patient identified by MRN, date of birth, ID band Patient awake    Reviewed: Allergy & Precautions, H&P , NPO status , Patient's Chart, lab work & pertinent test results  History of Anesthesia Complications (+) PONV and history of anesthetic complications  Airway Mallampati: II TM Distance: >3 FB Neck ROM: Full    Dental  (+) Dental Advisory Given   Pulmonary asthma , sleep apnea and Continuous Positive Airway Pressure Ventilation ,  breath sounds clear to auscultation        Cardiovascular negative cardio ROS  Rhythm:Regular Rate:Normal     Neuro/Psych  Headaches, PSYCHIATRIC DISORDERS Anxiety Depression    GI/Hepatic Neg liver ROS, GERD-  Medicated,  Endo/Other  Morbid obesity  Renal/GU negative Renal ROS  negative genitourinary   Musculoskeletal  (+) Fibromyalgia -  Abdominal (+) + obese,   Peds negative pediatric ROS (+)  Hematology negative hematology ROS (+)   Anesthesia Other Findings   Reproductive/Obstetrics negative OB ROS                           Anesthesia Physical Anesthesia Plan  ASA: III  Anesthesia Plan: General   Post-op Pain Management:    Induction: Intravenous  Airway Management Planned: LMA  Additional Equipment:   Intra-op Plan:   Post-operative Plan: Extubation in OR  Informed Consent: I have reviewed the patients History and Physical, chart, labs and discussed the procedure including the risks, benefits and alternatives for the proposed anesthesia with the patient or authorized representative who has indicated his/her understanding and acceptance.   Dental advisory given  Plan Discussed with: CRNA  Anesthesia Plan Comments:         Anesthesia Quick Evaluation

## 2014-01-26 NOTE — Interval H&P Note (Signed)
History and Physical Interval Note:  Karina Greer presents today for cystoscopy with hydraulic overdistention of her bladder for interstitial cystitis.  Exam: Well-developed well-nourished female in no acute distress Respiratory: Normal effort Cardiac: Regular rate and rhythm Abdomen: Obese, soft nontender Extremities: No gross edema  01/26/2014 8:37 AM  Karina Greer  has presented today for surgery, with the diagnosis of Interstitial Cystitis  The various methods of treatment have been discussed with the patient and family. After consideration of risks, benefits and other options for treatment, the patient has consented to  Procedure(s): CYSTOSCOPY/HYDRODISTENSION (N/A) as a surgical intervention .  The patient's history has been reviewed, patient examined, no change in status, stable for surgery.  I have reviewed the patient's chart and labs.  Questions were answered to the patient's satisfaction.     Bernestine Amass

## 2014-01-27 ENCOUNTER — Encounter (HOSPITAL_BASED_OUTPATIENT_CLINIC_OR_DEPARTMENT_OTHER): Payer: Self-pay | Admitting: Urology

## 2014-01-28 DIAGNOSIS — J309 Allergic rhinitis, unspecified: Secondary | ICD-10-CM | POA: Diagnosis not present

## 2014-01-28 DIAGNOSIS — F331 Major depressive disorder, recurrent, moderate: Secondary | ICD-10-CM | POA: Diagnosis not present

## 2014-01-29 ENCOUNTER — Other Ambulatory Visit: Payer: Self-pay | Admitting: Neurology

## 2014-01-31 ENCOUNTER — Telehealth: Payer: Self-pay

## 2014-01-31 NOTE — Telephone Encounter (Signed)
I called patient. I indicated that the Bentyl is not described to this office, she indicates that this is done through her gastroenterologist. We did not refill the medication.

## 2014-01-31 NOTE — Telephone Encounter (Signed)
[facility] in [place] is requesting a Rx for Bentyl 10mg  one twice daily.  It does not appear we have previously prescribed this med.  Would you like to fill?  Please advise.  Thank you.  (Patient has rescheduled appt, and will be seen on 06/09)

## 2014-02-01 DIAGNOSIS — N3010 Interstitial cystitis (chronic) without hematuria: Secondary | ICD-10-CM | POA: Diagnosis not present

## 2014-02-02 DIAGNOSIS — E7800 Pure hypercholesterolemia, unspecified: Secondary | ICD-10-CM | POA: Diagnosis not present

## 2014-02-02 DIAGNOSIS — R5383 Other fatigue: Secondary | ICD-10-CM | POA: Diagnosis not present

## 2014-02-02 DIAGNOSIS — R5381 Other malaise: Secondary | ICD-10-CM | POA: Diagnosis not present

## 2014-02-02 DIAGNOSIS — R42 Dizziness and giddiness: Secondary | ICD-10-CM | POA: Diagnosis not present

## 2014-02-02 DIAGNOSIS — G43009 Migraine without aura, not intractable, without status migrainosus: Secondary | ICD-10-CM | POA: Diagnosis not present

## 2014-02-02 DIAGNOSIS — R197 Diarrhea, unspecified: Secondary | ICD-10-CM | POA: Diagnosis not present

## 2014-02-07 ENCOUNTER — Other Ambulatory Visit: Payer: Self-pay | Admitting: Unknown Physician Specialty

## 2014-02-07 DIAGNOSIS — R197 Diarrhea, unspecified: Secondary | ICD-10-CM | POA: Diagnosis not present

## 2014-02-07 LAB — CLOSTRIDIUM DIFFICILE(ARMC)

## 2014-02-08 DIAGNOSIS — M66369 Spontaneous rupture of flexor tendons, unspecified lower leg: Secondary | ICD-10-CM | POA: Diagnosis not present

## 2014-02-10 LAB — STOOL CULTURE

## 2014-02-11 DIAGNOSIS — J309 Allergic rhinitis, unspecified: Secondary | ICD-10-CM | POA: Diagnosis not present

## 2014-02-17 ENCOUNTER — Encounter: Payer: Self-pay | Admitting: Registered Nurse

## 2014-02-17 ENCOUNTER — Ambulatory Visit (HOSPITAL_COMMUNITY)
Admission: RE | Admit: 2014-02-17 | Discharge: 2014-02-17 | Disposition: A | Discharge status: Home / Self Care | Payer: Medicare Other | Source: Ambulatory Visit | Attending: Registered Nurse | Admitting: Registered Nurse

## 2014-02-17 ENCOUNTER — Encounter: Payer: Medicare Other | Attending: Physical Medicine & Rehabilitation | Admitting: Registered Nurse

## 2014-02-17 VITALS — BP 126/82 | HR 77 | Resp 14 | Wt 225.0 lb

## 2014-02-17 DIAGNOSIS — F3289 Other specified depressive episodes: Secondary | ICD-10-CM

## 2014-02-17 DIAGNOSIS — G8929 Other chronic pain: Secondary | ICD-10-CM | POA: Diagnosis not present

## 2014-02-17 DIAGNOSIS — M25559 Pain in unspecified hip: Secondary | ICD-10-CM | POA: Insufficient documentation

## 2014-02-17 DIAGNOSIS — IMO0001 Reserved for inherently not codable concepts without codable children: Secondary | ICD-10-CM

## 2014-02-17 DIAGNOSIS — E669 Obesity, unspecified: Secondary | ICD-10-CM | POA: Diagnosis not present

## 2014-02-17 DIAGNOSIS — Z79899 Other long term (current) drug therapy: Secondary | ICD-10-CM

## 2014-02-17 DIAGNOSIS — M545 Low back pain, unspecified: Secondary | ICD-10-CM | POA: Diagnosis not present

## 2014-02-17 DIAGNOSIS — Z5181 Encounter for therapeutic drug level monitoring: Secondary | ICD-10-CM

## 2014-02-17 DIAGNOSIS — M25551 Pain in right hip: Secondary | ICD-10-CM

## 2014-02-17 DIAGNOSIS — G43009 Migraine without aura, not intractable, without status migrainosus: Secondary | ICD-10-CM | POA: Diagnosis not present

## 2014-02-17 DIAGNOSIS — F32A Depression, unspecified: Secondary | ICD-10-CM

## 2014-02-17 DIAGNOSIS — M169 Osteoarthritis of hip, unspecified: Secondary | ICD-10-CM | POA: Diagnosis not present

## 2014-02-17 DIAGNOSIS — F329 Major depressive disorder, single episode, unspecified: Secondary | ICD-10-CM

## 2014-02-17 DIAGNOSIS — M5137 Other intervertebral disc degeneration, lumbosacral region: Secondary | ICD-10-CM | POA: Diagnosis not present

## 2014-02-17 DIAGNOSIS — M51379 Other intervertebral disc degeneration, lumbosacral region without mention of lumbar back pain or lower extremity pain: Secondary | ICD-10-CM | POA: Diagnosis not present

## 2014-02-17 DIAGNOSIS — M1610 Unilateral primary osteoarthritis, unspecified hip: Secondary | ICD-10-CM | POA: Diagnosis not present

## 2014-02-17 MED ORDER — HYDROCODONE-ACETAMINOPHEN 10-325 MG PO TABS: ORAL_TABLET | ORAL | Status: DC

## 2014-02-17 NOTE — Progress Notes (Signed)
Subjective:    Patient ID: Karina Greer, female    DOB: 1965/12/02, 48 y.o.   MRN: [id number]  HPI: Karina Greer is a 48 year old female who returns for follow up for chronic pain and medication refill. She says her pain is located in her lower back, right hip and right foot. She rates her pain 8. Her current exercise routine is stretching exercises and ankle and chair exercises. She's not walking at this time due to a fall she had two weeks ago. She states she fell two weeks ago at her brother house, she lost her footing he has runners on his hardwood floors. She landed on her right hip and ankle. She went to see Dr. Lorin Mercy, she says he told her she had achilles tendonitis she has been keeping her right foot elevated and iced.     Pain Inventory Average Pain 6 Pain Right Now 8 My pain is intermittent, constant, sharp, dull, stabbing, tingling and aching  In the last 24 hours, has pain interfered with the following? General activity 5 Relation with others 5 Enjoyment of life 6 What TIME of day is your pain at its worst? all Sleep (in general) Fair  Pain is worse with: walking, bending, sitting, inactivity, standing and some activites Pain improves with: rest, heat/ice, therapy/exercise, pacing activities, medication, TENS and injections Relief from Meds: 6  Mobility walk without assistance how many minutes can you walk? 15 ability to climb steps?  yes do you drive?  yes  Function disabled: date disabled 2005 I need assistance with the following:  meal prep, household duties and shopping  Neuro/Psych bladder control problems dizziness depression anxiety  Prior Studies Any changes since last visit?  no  Physicians involved in your care Any changes since last visit?  no   Family History  Problem Relation Age of Onset  . Hypertension Mother   . Diabetes Father   . Fibromyalgia Sister   . Hypertension Other     Cancer, Cerebrovascular disease run on mother  side of family   History   Social History  . Marital Status: Legally Separated    Spouse Name: N/A    Number of Children: 3  . Years of Education: HS   Occupational History  . Unemployed     Disability   Social History Main Topics  . Smoking status: Never Smoker   . Smokeless tobacco: Never Used  . Alcohol Use: No  . Drug Use: No  . Sexual Activity: None   Other Topics Concern  . None   Social History Narrative  . None   Past Surgical History  Procedure Laterality Date  . Laparoscopic ovarian cyst bx  2005    AND  URETEROSCOPIC LASER LITHO  STONE EXTRACTION  . Cysto/  urethral dilation/  hydrodistention/   instillation therapy  07-16-2010//   12-30-2007//   10-27-2006  . Transthoracic echocardiogram  06-07-2006    normal study/  ef 60-65%  . Exercise tolerence test  10-12-2010    NEGATIVE  ADEQUATE ETT/  NO ISCHEMIA OR EVIDENCE HIGH GRADE OBSTRUCTIVE CAD/  NO FURTHER TEST NEEDED  . Tubal ligation Bilateral 1995  . Shoulder open rotator cuff repair Right 2004  . Cholecystectomy  2014  . Hysteroscopy w/  novasure endometrial ablation  2014  . Cysto with hydrodistension N/A 01/26/2014    Procedure: CYSTOSCOPY/HYDRODISTENSION;  Surgeon: Bernestine Amass, MD;  Location: [facility];  Service: Urology;  Laterality: N/A;  Past Medical History  Diagnosis Date  . Fibromyalgia   . Depression   . Dyslipidemia   . Arthritis   . GERD (gastroesophageal reflux disease)   . Chronic low back pain   . History of renal calculi   . RLS (restless legs syndrome)   . Interstitial cystitis   . IBS (irritable bowel syndrome)   . 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 PONV (postoperative nausea and vomiting)   . Anxiety disorder   . SI (sacroiliac) joint dysfunction   . SUI (stress urinary incontinence, female)   . Anxiety   . OSA on CPAP   . History of panic attacks   . Seasonal asthma   . Menorrhagia    BP 126/82  Pulse 77  Resp 14  Wt 225 lb  (102.059 kg)  SpO2 98%  LMP 12/25/2013  Opioid Risk Score:   Fall Risk Score: Low Fall Risk (0-5 points) (educated and fall prevention in the home handout given previously) Review of Systems  Constitutional: Positive for appetite change.  Respiratory: Positive for apnea and cough.   Gastrointestinal: Positive for nausea, vomiting, abdominal pain, diarrhea and constipation.  Genitourinary:       Bladder control problems  Neurological: Positive for dizziness.  Psychiatric/Behavioral: Positive for dysphoric mood. The patient is nervous/anxious.   All other systems reviewed and are negative.      Objective:   Physical Exam  Nursing note and vitals reviewed. Constitutional: She is oriented to person, place, and time. She appears well-developed and well-nourished.  HENT:  Head: Normocephalic and atraumatic.  Neck: Normal range of motion. Neck supple.  Cardiovascular: Normal rate, regular rhythm and normal heart sounds.   Pulmonary/Chest: Effort normal and breath sounds normal.  Musculoskeletal:  Normal Muscle Bulk and Muscle Testing Reveals: Upper Extremities: Full ROM and Muscle strength 5/5. Lower extremities: Bilateral Flexion Produces Pain to lumbar Right Lower extremity Muscle Strength 4/5 Left Lower extremity Muscle Strength   5/5 Arises from chair with ease Narrow based gait.  Neurological: She is alert and oriented to person, place, and time.  Skin: Skin is warm and dry.  Psychiatric: She has a normal mood and affect.          Assessment & Plan:  1.Right Hip Pain: S/P Fall:  X- ray of the Right Hip today 2. Lumbago: S/P Fall increased in intensity: X-ray of Lumbar Refilled: HYDROcodone 10/325mg  one tablet every 6 hours as needed #120  3. Fibromyalgia. Continue Current exercise  Regime  4. Anxiety and depression:Psychiatry Following  5. Migraines: On Maxalt. She says her neurologist added Vitamin B6 and Petadolex 4. OSA : Continue voltaren gel, exercise regime and  losing weight.   20 minutes of face to face patient care time was spent during this visit. All questions were encouraged and answered.

## 2014-02-22 DIAGNOSIS — R42 Dizziness and giddiness: Secondary | ICD-10-CM | POA: Diagnosis not present

## 2014-02-22 DIAGNOSIS — J309 Allergic rhinitis, unspecified: Secondary | ICD-10-CM | POA: Diagnosis not present

## 2014-02-24 DIAGNOSIS — J309 Allergic rhinitis, unspecified: Secondary | ICD-10-CM | POA: Diagnosis not present

## 2014-02-25 ENCOUNTER — Telehealth: Payer: Self-pay | Admitting: *Deleted

## 2014-02-25 NOTE — Telephone Encounter (Signed)
Will patient be willing to see Jinny Blossom instead of Willis on Tuesday? If yes, please switch schedules.

## 2014-02-28 ENCOUNTER — Ambulatory Visit: Payer: Self-pay | Admitting: Unknown Physician Specialty

## 2014-02-28 DIAGNOSIS — N949 Unspecified condition associated with female genital organs and menstrual cycle: Secondary | ICD-10-CM | POA: Diagnosis not present

## 2014-02-28 DIAGNOSIS — N318 Other neuromuscular dysfunction of bladder: Secondary | ICD-10-CM | POA: Diagnosis not present

## 2014-02-28 DIAGNOSIS — N3010 Interstitial cystitis (chronic) without hematuria: Secondary | ICD-10-CM | POA: Diagnosis not present

## 2014-02-28 DIAGNOSIS — Z713 Dietary counseling and surveillance: Secondary | ICD-10-CM | POA: Diagnosis not present

## 2014-02-28 DIAGNOSIS — E669 Obesity, unspecified: Secondary | ICD-10-CM | POA: Diagnosis not present

## 2014-03-01 ENCOUNTER — Ambulatory Visit: Payer: Medicare Other | Admitting: Neurology

## 2014-03-14 DIAGNOSIS — J449 Chronic obstructive pulmonary disease, unspecified: Secondary | ICD-10-CM | POA: Diagnosis not present

## 2014-03-14 DIAGNOSIS — G4730 Sleep apnea, unspecified: Secondary | ICD-10-CM | POA: Diagnosis not present

## 2014-03-14 DIAGNOSIS — G4720 Circadian rhythm sleep disorder, unspecified type: Secondary | ICD-10-CM | POA: Diagnosis not present

## 2014-03-14 DIAGNOSIS — G4710 Hypersomnia, unspecified: Secondary | ICD-10-CM | POA: Diagnosis not present

## 2014-03-16 DIAGNOSIS — R0602 Shortness of breath: Secondary | ICD-10-CM | POA: Diagnosis not present

## 2014-03-21 ENCOUNTER — Encounter: Payer: Self-pay | Admitting: Registered Nurse

## 2014-03-21 ENCOUNTER — Encounter: Payer: Medicare Other | Attending: Physical Medicine & Rehabilitation | Admitting: Registered Nurse

## 2014-03-21 VITALS — BP 132/78 | HR 82 | Resp 14 | Wt 222.0 lb

## 2014-03-21 DIAGNOSIS — E669 Obesity, unspecified: Secondary | ICD-10-CM

## 2014-03-21 DIAGNOSIS — Z79899 Other long term (current) drug therapy: Secondary | ICD-10-CM

## 2014-03-21 DIAGNOSIS — F329 Major depressive disorder, single episode, unspecified: Secondary | ICD-10-CM

## 2014-03-21 DIAGNOSIS — F32A Depression, unspecified: Secondary | ICD-10-CM

## 2014-03-21 DIAGNOSIS — N390 Urinary tract infection, site not specified: Secondary | ICD-10-CM | POA: Diagnosis not present

## 2014-03-21 DIAGNOSIS — Z5181 Encounter for therapeutic drug level monitoring: Secondary | ICD-10-CM

## 2014-03-21 DIAGNOSIS — IMO0001 Reserved for inherently not codable concepts without codable children: Secondary | ICD-10-CM

## 2014-03-21 DIAGNOSIS — M5441 Lumbago with sciatica, right side: Secondary | ICD-10-CM

## 2014-03-21 DIAGNOSIS — M5430 Sciatica, unspecified side: Secondary | ICD-10-CM

## 2014-03-21 DIAGNOSIS — G43009 Migraine without aura, not intractable, without status migrainosus: Secondary | ICD-10-CM | POA: Diagnosis not present

## 2014-03-21 DIAGNOSIS — F3289 Other specified depressive episodes: Secondary | ICD-10-CM | POA: Diagnosis not present

## 2014-03-21 DIAGNOSIS — M25559 Pain in unspecified hip: Secondary | ICD-10-CM

## 2014-03-21 DIAGNOSIS — M25551 Pain in right hip: Secondary | ICD-10-CM

## 2014-03-21 MED ORDER — HYDROCODONE-ACETAMINOPHEN 10-325 MG PO TABS: ORAL_TABLET | ORAL | Status: DC

## 2014-03-21 MED ORDER — METHYLPREDNISOLONE 4 MG PO KIT: PACK | ORAL | Status: DC

## 2014-03-21 NOTE — Progress Notes (Signed)
Subjective:    Patient ID: Karina Greer, female    DOB: [birth year], 48 y.o.   MRN: [id number]  HPI: Karina Greer is a 48 year old female who returns for follow up for chronic pain and medication refill. She says her pain is located in her lower back and right hip. Also states her right achilles tendonitis has been having increase intensity of pain. She will be Following with Dr. Lorin Mercy, She rates her pain 6. Her current exercise regime is performing stretching exercises, her walking is limited. She also using heat and ice therapy.  She is complaining of burning upon urination, urinalysis and urine culture ordered. She will follow up with her urologist. She is aware I will call her with urine culture results she verbalizes understanding.  Pain Inventory Average Pain 6 Pain Right Now 6 My pain is intermittent, sharp, dull, tingling and aching  In the last 24 hours, has pain interfered with the following? General activity 5 Relation with others 5 Enjoyment of life 6 What TIME of day is your pain at its worst? all Sleep (in general) Fair  Pain is worse with: walking, bending, sitting, inactivity, standing and some activites Pain improves with: rest, heat/ice, therapy/exercise, pacing activities, medication and TENS Relief from Meds: 6  Mobility walk without assistance how many minutes can you walk? 15 ability to climb steps?  yes do you drive?  yes  Function disabled: date disabled 2005 I need assistance with the following:  meal prep, household duties and shopping  Neuro/Psych bladder control problems numbness tingling spasms dizziness depression anxiety  Prior Studies Any changes since last visit?  no  Physicians involved in your care Any changes since last visit?  no   Family History  Problem Relation Age of Onset  . Hypertension Mother   . Diabetes Father   . Fibromyalgia Sister   . Hypertension Other     Cancer, Cerebrovascular disease run on mother  side of family   History   Social History  . Marital Status: Legally Separated    Spouse Name: N/A    Number of Children: 3  . Years of Education: HS   Occupational History  . Unemployed     Disability   Social History Main Topics  . Smoking status: Never Smoker   . Smokeless tobacco: Never Used  . Alcohol Use: No  . Drug Use: No  . Sexual Activity: None   Other Topics Concern  . None   Social History Narrative  . None   Past Surgical History  Procedure Laterality Date  . Laparoscopic ovarian cyst bx  2005    AND  URETEROSCOPIC LASER LITHO  STONE EXTRACTION  . Cysto/  urethral dilation/  hydrodistention/   instillation therapy  07-16-2010//   12-30-2007//   10-27-2006  . Transthoracic echocardiogram  06-07-2006    normal study/  ef 60-65%  . Exercise tolerence test  10-12-2010    NEGATIVE  ADEQUATE ETT/  NO ISCHEMIA OR EVIDENCE HIGH GRADE OBSTRUCTIVE CAD/  NO FURTHER TEST NEEDED  . Tubal ligation Bilateral 1995  . Shoulder open rotator cuff repair Right 2004  . Cholecystectomy  2014  . Hysteroscopy w/  novasure endometrial ablation  2014  . Cysto with hydrodistension N/A 01/26/2014    Procedure: CYSTOSCOPY/HYDRODISTENSION;  Surgeon: Bernestine Amass, MD;  Location: [facility];  Service: Urology;  Laterality: N/A;   Past Medical History  Diagnosis Date  . Fibromyalgia   . Depression   .  Dyslipidemia   . Arthritis   . GERD (gastroesophageal reflux disease)   . Chronic low back pain   . History of renal calculi   . RLS (restless legs syndrome)   . Interstitial cystitis   . IBS (irritable bowel syndrome)   . 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 PONV (postoperative nausea and vomiting)   . Anxiety disorder   . SI (sacroiliac) joint dysfunction   . SUI (stress urinary incontinence, female)   . Anxiety   . OSA on CPAP   . History of panic attacks   . Seasonal asthma   . Menorrhagia    BP 132/78  Pulse 82  Resp 14  Wt 222 lb  (100.699 kg)  SpO2 97%  Opioid Risk Score:   Fall Risk Score: Moderate Fall Risk (6-13 points) (previously educated and given handout for fall prevention in the home)  Review of Systems  Constitutional: Positive for appetite change.  Respiratory: Positive for apnea.   Gastrointestinal: Positive for nausea and abdominal pain.  Musculoskeletal:       Spasms  Neurological: Positive for dizziness and numbness.       Tingling  Psychiatric/Behavioral: Positive for dysphoric mood. The patient is nervous/anxious.   All other systems reviewed and are negative.      Objective:   Physical Exam  Nursing note and vitals reviewed. Constitutional: She is oriented to person, place, and time. She appears well-developed and well-nourished.  HENT:  Head: Normocephalic and atraumatic.  Neck: Normal range of motion. Neck supple.  Cardiovascular: Normal rate, regular rhythm and normal heart sounds.   Pulmonary/Chest: Effort normal and breath sounds normal.  Musculoskeletal:  Normal Muscle Bulk and Muscle Testing Reveals: Upper Extremities Full ROM and Muscle Strength 5/5 Lumbar Paraspinal Tenderness: L-3- L-5 Right Gluteal Maximus Tenderness Bilateral Greater Trochanteric Tenderness Lower Extremities: Left Leg: Full ROM and Muscle Strength 5/5 Right Leg: Flexion Produces Pain into Right Hip and Lumbar Arises from chair with ease Antalgic Gait  Neurological: She is alert and oriented to person, place, and time.  Skin: Skin is warm and dry.  Psychiatric: She has a normal mood and affect.          Assessment & Plan:  1.Right Hip Pain/ Bilateral Greater Trochanteric Tenderness: RX: Medrol Dose Pak 2. Lumbago:  Refilled: HYDROcodone 10/325mg  one tablet every 6 hours as needed #120  3. Fibromyalgia. Continue Current exercise Regime  4. Anxiety and depression:Psychiatry Following  5. Migraines: On Maxalt. Neurology Following 6. OSA : Continue voltaren gel, exercise regime and losing weight.   7. Obesity: Following with Dietician, encourage to continue follow with healthy diet regimen.  30 minutes of face to face patient care time was spent during this visit. All questions were encouraged and answered.

## 2014-03-21 NOTE — Patient Instructions (Signed)
Hip Exercises RANGE OF MOTION (ROM) AND STRETCHING EXERCISES  These exercises may help you when beginning to rehabilitate your injury. Doing them too aggressively can worsen your condition. Complete them slowly and gently. Your symptoms may resolve with or without further involvement from your physician, physical therapist or athletic trainer. While completing these exercises, remember:   Restoring tissue flexibility helps normal motion to return to the joints. This allows healthier, less painful movement and activity.  An effective stretch should be held for at least 30 seconds.  A stretch should never be painful. You should only feel a gentle lengthening or release in the stretched tissue. If these stretches worsen your symptoms even when done gently, consult your physician, physical therapist or athletic trainer. STRETCH - Hamstrings, Supine   Lie on your back. Loop a belt or towel over the ball of your right / left foot.  Straighten your right / left knee and slowly pull on the belt to raise your leg. Do not allow the right / left knee to bend. Keep your opposite leg flat on the floor.  Raise the leg until you feel a gentle stretch behind your right / left knee or thigh. Hold this position for __________ seconds. Repeat __________ times. Complete this stretch __________ times per day.  STRETCH - Hip Rotators   Lie on your back on a firm surface. Grasp your right / left knee with your right / left hand and your ankle with your opposite hand.  Keeping your hips and shoulders firmly planted, gently pull your right / left knee and rotate your lower leg toward your opposite shoulder until you feel a stretch in your buttocks.  Hold this stretch for __________ seconds. Repeat this stretch __________ times. Complete this stretch __________ times per day. STRETCH - Hamstrings/Adductors, V-Sit   Sit on the floor with your legs extended in a large "V," keeping your knees straight.  With your  head and chest upright, bend at your waist reaching for your right foot to stretch your left adductors.  You should feel a stretch in your left inner thigh. Hold for __________ seconds.  Return to the upright position to relax your leg muscles.  Continuing to keep your chest upright, bend straight forward at your waist to stretch your hamstrings.  You should feel a stretch behind both of your thighs and/or knees. Hold for __________ seconds.  Return to the upright position to relax your leg muscles.  Repeat steps 2 through 4 for opposite leg. Repeat __________ times. Complete this exercise __________ times per day.  STRETCHING - Hip Flexors, Lunge  Half kneel with your right / left knee on the floor and your opposite knee bent and directly over your ankle.  Keep good posture with your head over your shoulders. Tighten your buttocks to point your tailbone downward; this will prevent your back from arching too much.  You should feel a gentle stretch in the front of your thigh and/or hip. If you do not feel any resistance, slightly slide your opposite foot forward and then slowly lunge forward so your knee once again lines up over your ankle. Be sure your tailbone remains pointed downward.  Hold this stretch for __________ seconds. Repeat __________ times. Complete this stretch __________ times per day. STRENGTHENING EXERCISES These exercises may help you when beginning to rehabilitate your injury. They may resolve your symptoms with or without further involvement from your physician, physical therapist or athletic trainer. While completing these exercises, remember:   Muscles  can gain both the endurance and the strength needed for everyday activities through controlled exercises.  Complete these exercises as instructed by your physician, physical therapist or athletic trainer. Progress the resistance and repetitions only as guided.  You may experience muscle soreness or fatigue, but the  pain or discomfort you are trying to eliminate should never worsen during these exercises. If this pain does worsen, stop and make certain you are following the directions exactly. If the pain is still present after adjustments, discontinue the exercise until you can discuss the trouble with your clinician. STRENGTH - Hip Extensors, Bridge   Lie on your back on a firm surface. Bend your knees and place your feet flat on the floor.  Tighten your buttocks muscles and lift your bottom off the floor until your trunk is level with your thighs. You should feel the muscles in your buttocks and back of your thighs working. If you do not feel these muscles, slide your feet 1-2 inches further away from your buttocks.  Hold this position for __________ seconds.  Slowly lower your hips to the starting position and allow your buttock muscles relax completely before beginning the next repetition.  If this exercise is too easy, you may cross your arms over your chest. Repeat __________ times. Complete this exercise __________ times per day.  STRENGTH - Hip Abductors, Straight Leg Raises  Be aware of your form throughout the entire exercise so that you exercise the correct muscles. Sloppy form means that you are not strengthening the correct muscles.  Lie on your side so that your head, shoulders, knee and hip line up. You may bend your lower knee to help maintain your balance. Your right / left leg should be on top.  Roll your hips slightly forward, so that your hips are stacked directly over each other and your right / left knee is facing forward.  Lift your top leg up 4-6 inches, leading with your heel. Be sure that your foot does not drift forward or that your knee does not roll toward the ceiling.  Hold this position for __________ seconds. You should feel the muscles in your outer hip lifting (you may not notice this until your leg begins to tire).  Slowly lower your leg to the starting position. Allow  the muscles to fully relax before beginning the next repetition. Repeat __________ times. Complete this exercise __________ times per day.  STRENGTH - Hip Adductors, Straight Leg Raises   Lie on your side so that your head, shoulders, knee and hip line up. You may place your upper foot in front to help maintain your balance. Your right / left leg should be on the bottom.  Roll your hips slightly forward, so that your hips are stacked directly over each other and your right / left knee is facing forward.  Tense the muscles in your inner thigh and lift your bottom leg 4-6 inches. Hold this position for __________ seconds.  Slowly lower your leg to the starting position. Allow the muscles to fully relax before beginning the next repetition. Repeat __________ times. Complete this exercise __________ times per day.  STRENGTH - Quadriceps, Straight Leg Raises  Quality counts! Watch for signs that the quadriceps muscle is working to insure you are strengthening the correct muscles and not "cheating" by substituting with healthier muscles.  Lay on your back with your right / left leg extended and your opposite knee bent.  Tense the muscles in the front of your right / left  thigh. You should see either your knee cap slide up or increased dimpling just above the knee. Your thigh may even quiver.  Tighten these muscles even more and raise your leg 4 to 6 inches off the floor. Hold for right / left seconds.  Keeping these muscles tense, lower your leg.  Relax the muscles slowly and completely in between each repetition. Repeat __________ times. Complete this exercise __________ times per day.  STRENGTH - Hip Abductors, Standing  Tie one end of a rubber exercise band/tubing to a secure surface (table, pole) and tie a loop at the other end.  Place the loop around your right / left ankle. Keeping your ankle with the band directly opposite of the secured end, step away until there is tension in the  tube/band.  Hold onto a chair as needed for balance.  Keeping your back upright, your shoulders over your hips, and your toes pointing forward, lift your right / left leg out to your side. Be sure to lift your leg with your hip muscles. Do not "throw" your leg or tip your body to lift your leg.  Slowly and with control, return to the starting position. Repeat exercise __________ times. Complete this exercise __________ times per day.  STRENGTH - Quadriceps, Squats  Stand in a door frame so that your feet and knees are in line with the frame.  Use your hands for balance, not support, on the frame.  Slowly lower your weight, bending at the hips and knees. Keep your lower legs upright so that they are parallel with the door frame. Squat only within the range that does not increase your knee pain. Never let your hips drop below your knees.  Slowly return upright, pushing with your legs, not pulling with your hands. Document Released: 09/27/2005 Document Revised: 12/02/2011 Document Reviewed: 12/22/2008 [facility] Patient Information 2015 [place], [place]. This information is not intended to replace advice given to you by your health care provider. Make sure you discuss any questions you have with your health care provider. Back Exercises Back exercises help treat and prevent back injuries. The goal of back exercises is to increase the strength of your abdominal and back muscles and the flexibility of your back. These exercises should be started when you no longer have back pain. Back exercises include:  Pelvic Tilt. Lie on your back with your knees bent. Tilt your pelvis until the lower part of your back is against the floor. Hold this position 5 to 10 sec and repeat 5 to 10 times.  Knee to Chest. Pull first 1 knee up against your chest and hold for 20 to 30 seconds, repeat this with the other knee, and then both knees. This may be done with the other leg straight or bent, whichever feels  better.  Sit-Ups or Curl-Ups. Bend your knees 90 degrees. Start with tilting your pelvis, and do a partial, slow sit-up, lifting your trunk only 30 to 45 degrees off the floor. Take at least 2 to 3 seconds for each sit-up. Do not do sit-ups with your knees out straight. If partial sit-ups are difficult, simply do the above but with only tightening your abdominal muscles and holding it as directed.  Hip-Lift. Lie on your back with your knees flexed 90 degrees. Push down with your feet and shoulders as you raise your hips a couple inches off the floor; hold for 10 seconds, repeat 5 to 10 times.  Back arches. Lie on your stomach, propping yourself up on bent  elbows. Slowly press on your hands, causing an arch in your low back. Repeat 3 to 5 times. Any initial stiffness and discomfort should lessen with repetition over time.  Shoulder-Lifts. Lie face down with arms beside your body. Keep hips and torso pressed to floor as you slowly lift your head and shoulders off the floor. Do not overdo your exercises, especially in the beginning. Exercises may cause you some mild back discomfort which lasts for a few minutes; however, if the pain is more severe, or lasts for more than 15 minutes, do not continue exercises until you see your caregiver. Improvement with exercise therapy for back problems is slow.  See your caregivers for assistance with developing a proper back exercise program. Document Released: 10/17/2004 Document Revised: 12/02/2011 Document Reviewed: 07/11/2011 [facility] Patient Information 2015 [place], [place]. This information is not intended to replace advice given to you by your health care provider. Make sure you discuss any questions you have with your health care provider.

## 2014-03-22 LAB — URINALYSIS
Bilirubin Urine: NEGATIVE
Glucose, UA: NEGATIVE mg/dL
Hgb urine dipstick: NEGATIVE
Ketones, ur: NEGATIVE mg/dL
Nitrite: NEGATIVE
Protein, ur: NEGATIVE mg/dL
Specific Gravity, Urine: 1.027 (ref 1.005–1.030)
Urobilinogen, UA: 0.2 mg/dL (ref 0.0–1.0)
pH: 5 (ref 5.0–8.0)

## 2014-03-23 ENCOUNTER — Ambulatory Visit: Payer: Self-pay | Admitting: Unknown Physician Specialty

## 2014-03-23 DIAGNOSIS — M79609 Pain in unspecified limb: Secondary | ICD-10-CM | POA: Diagnosis not present

## 2014-03-23 DIAGNOSIS — L719 Rosacea, unspecified: Secondary | ICD-10-CM | POA: Diagnosis not present

## 2014-03-23 DIAGNOSIS — M7660 Achilles tendinitis, unspecified leg: Secondary | ICD-10-CM | POA: Diagnosis not present

## 2014-03-23 DIAGNOSIS — D72829 Elevated white blood cell count, unspecified: Secondary | ICD-10-CM | POA: Diagnosis not present

## 2014-03-23 DIAGNOSIS — E7800 Pure hypercholesterolemia, unspecified: Secondary | ICD-10-CM | POA: Diagnosis not present

## 2014-03-24 ENCOUNTER — Telehealth: Payer: Self-pay | Admitting: Registered Nurse

## 2014-03-24 LAB — URINE CULTURE: Colony Count: 50000

## 2014-03-24 MED ORDER — CIPROFLOXACIN HCL 500 MG PO TABS: 500.0000 mg | ORAL_TABLET | Freq: Two times a day (BID) | ORAL | Status: DC

## 2014-03-24 NOTE — Telephone Encounter (Signed)
I spoke with Karina Greer regarding her urine culture: She had 50,000 of E Coli. Prescribed Cipro 500 mg BID x 14 days. She was instructed not to take Tizanidine whilee on Cipro. She verbalized Understanding.

## 2014-04-01 DIAGNOSIS — F3112 Bipolar disorder, current episode manic without psychotic features, moderate: Secondary | ICD-10-CM | POA: Diagnosis not present

## 2014-04-01 DIAGNOSIS — N3010 Interstitial cystitis (chronic) without hematuria: Secondary | ICD-10-CM | POA: Diagnosis not present

## 2014-04-01 DIAGNOSIS — R109 Unspecified abdominal pain: Secondary | ICD-10-CM | POA: Diagnosis not present

## 2014-04-04 DIAGNOSIS — E669 Obesity, unspecified: Secondary | ICD-10-CM | POA: Diagnosis not present

## 2014-04-04 DIAGNOSIS — M6281 Muscle weakness (generalized): Secondary | ICD-10-CM | POA: Diagnosis not present

## 2014-04-04 DIAGNOSIS — R51 Headache: Secondary | ICD-10-CM | POA: Diagnosis not present

## 2014-04-04 DIAGNOSIS — N3010 Interstitial cystitis (chronic) without hematuria: Secondary | ICD-10-CM | POA: Diagnosis not present

## 2014-04-04 DIAGNOSIS — M62838 Other muscle spasm: Secondary | ICD-10-CM | POA: Diagnosis not present

## 2014-04-04 DIAGNOSIS — R200 Anesthesia of skin: Secondary | ICD-10-CM | POA: Insufficient documentation

## 2014-04-04 DIAGNOSIS — J309 Allergic rhinitis, unspecified: Secondary | ICD-10-CM | POA: Diagnosis not present

## 2014-04-04 DIAGNOSIS — M542 Cervicalgia: Secondary | ICD-10-CM | POA: Insufficient documentation

## 2014-04-04 DIAGNOSIS — R202 Paresthesia of skin: Secondary | ICD-10-CM

## 2014-04-04 DIAGNOSIS — R519 Headache, unspecified: Secondary | ICD-10-CM | POA: Insufficient documentation

## 2014-04-04 DIAGNOSIS — R279 Unspecified lack of coordination: Secondary | ICD-10-CM | POA: Diagnosis not present

## 2014-04-04 DIAGNOSIS — R209 Unspecified disturbances of skin sensation: Secondary | ICD-10-CM | POA: Diagnosis not present

## 2014-04-05 DIAGNOSIS — L719 Rosacea, unspecified: Secondary | ICD-10-CM | POA: Diagnosis not present

## 2014-04-05 DIAGNOSIS — L981 Factitial dermatitis: Secondary | ICD-10-CM | POA: Diagnosis not present

## 2014-04-11 DIAGNOSIS — H6980 Other specified disorders of Eustachian tube, unspecified ear: Secondary | ICD-10-CM | POA: Diagnosis not present

## 2014-04-11 DIAGNOSIS — K219 Gastro-esophageal reflux disease without esophagitis: Secondary | ICD-10-CM | POA: Diagnosis not present

## 2014-04-14 DIAGNOSIS — J069 Acute upper respiratory infection, unspecified: Secondary | ICD-10-CM | POA: Diagnosis not present

## 2014-04-18 ENCOUNTER — Encounter: Payer: Self-pay | Admitting: Registered Nurse

## 2014-04-18 ENCOUNTER — Telehealth: Payer: Self-pay

## 2014-04-18 ENCOUNTER — Encounter: Payer: Medicare Other | Attending: Physical Medicine & Rehabilitation | Admitting: Registered Nurse

## 2014-04-18 ENCOUNTER — Ambulatory Visit (HOSPITAL_COMMUNITY)
Admission: RE | Admit: 2014-04-18 | Discharge: 2014-04-18 | Disposition: A | Discharge status: Home / Self Care | Payer: Medicare Other | Source: Ambulatory Visit | Attending: Registered Nurse | Admitting: Registered Nurse

## 2014-04-18 VITALS — BP 141/83 | HR 79 | Resp 14

## 2014-04-18 DIAGNOSIS — R0989 Other specified symptoms and signs involving the circulatory and respiratory systems: Secondary | ICD-10-CM | POA: Diagnosis not present

## 2014-04-18 DIAGNOSIS — M797 Fibromyalgia: Secondary | ICD-10-CM | POA: Insufficient documentation

## 2014-04-18 DIAGNOSIS — M5441 Lumbago with sciatica, right side: Secondary | ICD-10-CM

## 2014-04-18 DIAGNOSIS — J019 Acute sinusitis, unspecified: Secondary | ICD-10-CM | POA: Diagnosis not present

## 2014-04-18 DIAGNOSIS — R079 Chest pain, unspecified: Secondary | ICD-10-CM | POA: Diagnosis not present

## 2014-04-18 DIAGNOSIS — M810 Age-related osteoporosis without current pathological fracture: Secondary | ICD-10-CM | POA: Insufficient documentation

## 2014-04-18 DIAGNOSIS — J189 Pneumonia, unspecified organism: Secondary | ICD-10-CM

## 2014-04-18 DIAGNOSIS — F32A Depression, unspecified: Secondary | ICD-10-CM

## 2014-04-18 DIAGNOSIS — N3090 Cystitis, unspecified without hematuria: Secondary | ICD-10-CM | POA: Insufficient documentation

## 2014-04-18 DIAGNOSIS — M1990 Unspecified osteoarthritis, unspecified site: Secondary | ICD-10-CM | POA: Insufficient documentation

## 2014-04-18 DIAGNOSIS — E669 Obesity, unspecified: Secondary | ICD-10-CM | POA: Diagnosis not present

## 2014-04-18 DIAGNOSIS — M5430 Sciatica, unspecified side: Secondary | ICD-10-CM

## 2014-04-18 DIAGNOSIS — F3289 Other specified depressive episodes: Secondary | ICD-10-CM

## 2014-04-18 DIAGNOSIS — IMO0001 Reserved for inherently not codable concepts without codable children: Secondary | ICD-10-CM | POA: Diagnosis not present

## 2014-04-18 DIAGNOSIS — M940 Chondrocostal junction syndrome [Tietze]: Secondary | ICD-10-CM | POA: Diagnosis not present

## 2014-04-18 DIAGNOSIS — F329 Major depressive disorder, single episode, unspecified: Secondary | ICD-10-CM

## 2014-04-18 DIAGNOSIS — Z5181 Encounter for therapeutic drug level monitoring: Secondary | ICD-10-CM

## 2014-04-18 DIAGNOSIS — M129 Arthropathy, unspecified: Secondary | ICD-10-CM | POA: Insufficient documentation

## 2014-04-18 DIAGNOSIS — F419 Anxiety disorder, unspecified: Secondary | ICD-10-CM | POA: Insufficient documentation

## 2014-04-18 DIAGNOSIS — Z79899 Other long term (current) drug therapy: Secondary | ICD-10-CM

## 2014-04-18 DIAGNOSIS — M329 Systemic lupus erythematosus, unspecified: Secondary | ICD-10-CM | POA: Insufficient documentation

## 2014-04-18 DIAGNOSIS — G43009 Migraine without aura, not intractable, without status migrainosus: Secondary | ICD-10-CM | POA: Diagnosis not present

## 2014-04-18 MED ORDER — HYDROCODONE-ACETAMINOPHEN 10-325 MG PO TABS: ORAL_TABLET | ORAL | Status: DC

## 2014-04-18 NOTE — Telephone Encounter (Signed)
I spoke with Ms. Celia she was given her xray results and a copy sent to the PA.  I okay the Tussinex prescribed by her PA Primary Care

## 2014-04-18 NOTE — Telephone Encounter (Signed)
Patient states she went to see her PCP. He prescribed Tussionex for her she wanted to call and inform Zella Ball.

## 2014-04-18 NOTE — Progress Notes (Signed)
Subjective:    Patient ID: Karina Greer, female    DOB: 1966/01/14, 48 y.o.   MRN: [id number]  HPI: [name] is a 48 year old female who returns for follow up for chronic pain and medication refill. She says her pain is located in her left ear and her chest from coughing and a sore throat. She rates her pain 6. She has not followed an exercise regime for the last three weeks due to not feeling well.   She went to emergent care and was told her eustachian tube was blocked she was given an antibiotic and referral to ENT. She followed up with her Primary PA Karina Greer. The following week 7/26 she wasn't feeling better, she went back to the PCP and received steroids and another antibiotic. Today she arrived to the office very tearful and stated she feels awful, and felt like an elephant was sitting on her chest. Denies any chest pain or pain radiating down her arms. She refused to go to the ED. She consented to chest xray, this was ordered. She went to [facility], results: no acute abnormalities. She was called and results were given. She also followed up with her urologist she says she had an [place] and it showed she had a kidney stone. Her urologist is following. She has been sent to Pelvic Pain Dysfunction Physical Therapy. Pain Inventory Average Pain 8 Pain Right Now 7 My pain is constant, sharp, stabbing and aching  In the last 24 hours, has pain interfered with the following? General activity 8 Relation with others 9 Enjoyment of life 10 What TIME of day is your pain at its worst? constant all day Sleep (in general) Fair  Pain is worse with: walking, bending, sitting, inactivity, standing, unsure and some activites Pain improves with: rest, heat/ice, therapy/exercise, pacing activities, medication and TENS Relief from Meds: 5  Mobility walk without assistance how many minutes can you walk? 5 ability to climb steps?  no do you drive?  yes transfers alone Do you have any goals  in this area?  yes  Function disabled: date disabled na I need assistance with the following:  meal prep, household duties and shopping Do you have any goals in this area?  yes  Neuro/Psych bladder control problems bowel control problems  Prior Studies Any changes since last visit?  no  Physicians involved in your care Any changes since last visit?  no   Family History  Problem Relation Age of Onset  . Hypertension Mother   . Diabetes Father   . Fibromyalgia Sister   . Hypertension Other     Cancer, Cerebrovascular disease run on mother side of family   History   Social History  . Marital Status: Legally Separated    Spouse Name: N/A    Number of Children: 3  . Years of Education: HS   Occupational History  . Unemployed     Disability   Social History Main Topics  . Smoking status: Never Smoker   . Smokeless tobacco: Never Used  . Alcohol Use: No  . Drug Use: No  . Sexual Activity: None   Other Topics Concern  . None   Social History Narrative  . None   Past Surgical History  Procedure Laterality Date  . Laparoscopic ovarian cyst bx  2005    AND  URETEROSCOPIC LASER LITHO  STONE EXTRACTION  . Cysto/  urethral dilation/  hydrodistention/   instillation therapy  07-16-2010//   12-30-2007//  10-27-2006  . Transthoracic echocardiogram  06-07-2006    normal study/  ef 60-65%  . Exercise tolerence test  10-12-2010    NEGATIVE  ADEQUATE ETT/  NO ISCHEMIA OR EVIDENCE HIGH GRADE OBSTRUCTIVE CAD/  NO FURTHER TEST NEEDED  . Tubal ligation Bilateral 1995  . Shoulder open rotator cuff repair Right 2004  . Cholecystectomy  2014  . Hysteroscopy w/  novasure endometrial ablation  2014  . Cysto with hydrodistension N/A 01/26/2014    Procedure: CYSTOSCOPY/HYDRODISTENSION;  Surgeon: Bernestine Amass, MD;  Location: [facility];  Service: Urology;  Laterality: N/A;   Past Medical History  Diagnosis Date  . Fibromyalgia   . Depression   . Dyslipidemia     . Arthritis   . GERD (gastroesophageal reflux disease)   . Chronic low back pain   . History of renal calculi   . RLS (restless legs syndrome)   . Interstitial cystitis   . IBS (irritable bowel syndrome)   . 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 PONV (postoperative nausea and vomiting)   . Anxiety disorder   . SI (sacroiliac) joint dysfunction   . SUI (stress urinary incontinence, female)   . Anxiety   . OSA on CPAP   . History of panic attacks   . Seasonal asthma   . Menorrhagia    BP 141/83  Pulse 79  Resp 14  SpO2 98%  Opioid Risk Score:   Fall Risk Score: High Fall Risk (>13 points) (pt educated on fall risk, brochure given to pt previously)    Review of Systems  Genitourinary:       Bowel and bladder control problems  Musculoskeletal: Positive for back pain.  All other systems reviewed and are negative.      Objective:   Physical Exam  Nursing note and vitals reviewed. Constitutional: She is oriented to person, place, and time. She appears well-developed and well-nourished.  HENT:  Head: Normocephalic and atraumatic.  Neck: Normal range of motion. Neck supple.  Cardiovascular: Normal rate and regular rhythm.   Pulmonary/Chest: Effort normal and breath sounds normal.  Musculoskeletal:  Normal Muscle Bulk and Muscle Testing Reveals: Upper Extremities: Full ROM and Muscle strength 5/5 Spinal Forward Flexion 30 Degrees and extension 20 Degrees Lumbar Paraspinal Tenderness: L-3- L-5 Lower Extremities: Full ROM and Muscle Strength 5/5 Arises From chair with ease Narrow based gait  Neurological: She is alert and oriented to person, place, and time.  Skin: Skin is warm and dry.  Psychiatric: She has a normal mood and affect.          Assessment & Plan:  1. Lumbago:  Refilled: HYDROcodone 10/325mg  one tablet every 6 hours as needed #120  2. Fibromyalgia. Continue Current exercise Regime  3. Anxiety and depression:Psychiatry Following  4.  Migraines: On Maxalt. Neurology Following  5. OSA : Continue voltaren gel, exercise regime and losing weight.  6. Obesity: Following with Dietician, encourage to continue follow with healthy diet regimen.  7. ? Pneumonia: RX: CXR: F/U PCP  30 minutes of face to face patient care time was spent during this visit. All questions were encouraged and answered.  F/U in 1 Month

## 2014-04-21 DIAGNOSIS — J309 Allergic rhinitis, unspecified: Secondary | ICD-10-CM | POA: Diagnosis not present

## 2014-04-22 DIAGNOSIS — R141 Gas pain: Secondary | ICD-10-CM | POA: Diagnosis not present

## 2014-04-22 DIAGNOSIS — R143 Flatulence: Secondary | ICD-10-CM | POA: Diagnosis not present

## 2014-04-28 DIAGNOSIS — J309 Allergic rhinitis, unspecified: Secondary | ICD-10-CM | POA: Diagnosis not present

## 2014-05-04 DIAGNOSIS — J309 Allergic rhinitis, unspecified: Secondary | ICD-10-CM | POA: Diagnosis not present

## 2014-05-06 DIAGNOSIS — K219 Gastro-esophageal reflux disease without esophagitis: Secondary | ICD-10-CM | POA: Diagnosis not present

## 2014-05-06 DIAGNOSIS — M79609 Pain in unspecified limb: Secondary | ICD-10-CM | POA: Diagnosis not present

## 2014-05-06 DIAGNOSIS — R0789 Other chest pain: Secondary | ICD-10-CM | POA: Diagnosis not present

## 2014-05-06 DIAGNOSIS — F341 Dysthymic disorder: Secondary | ICD-10-CM | POA: Diagnosis not present

## 2014-05-16 ENCOUNTER — Encounter: Payer: Self-pay | Admitting: Registered Nurse

## 2014-05-16 ENCOUNTER — Encounter: Payer: Medicare Other | Attending: Physical Medicine & Rehabilitation | Admitting: Registered Nurse

## 2014-05-16 VITALS — BP 156/97 | HR 73 | Resp 14 | Wt 224.6 lb

## 2014-05-16 DIAGNOSIS — F329 Major depressive disorder, single episode, unspecified: Secondary | ICD-10-CM

## 2014-05-16 DIAGNOSIS — G43009 Migraine without aura, not intractable, without status migrainosus: Secondary | ICD-10-CM

## 2014-05-16 DIAGNOSIS — F3289 Other specified depressive episodes: Secondary | ICD-10-CM | POA: Diagnosis not present

## 2014-05-16 DIAGNOSIS — Z5181 Encounter for therapeutic drug level monitoring: Secondary | ICD-10-CM

## 2014-05-16 DIAGNOSIS — E669 Obesity, unspecified: Secondary | ICD-10-CM

## 2014-05-16 DIAGNOSIS — M5441 Lumbago with sciatica, right side: Secondary | ICD-10-CM

## 2014-05-16 DIAGNOSIS — M5430 Sciatica, unspecified side: Secondary | ICD-10-CM

## 2014-05-16 DIAGNOSIS — J189 Pneumonia, unspecified organism: Secondary | ICD-10-CM | POA: Insufficient documentation

## 2014-05-16 DIAGNOSIS — IMO0001 Reserved for inherently not codable concepts without codable children: Secondary | ICD-10-CM

## 2014-05-16 DIAGNOSIS — F32A Depression, unspecified: Secondary | ICD-10-CM

## 2014-05-16 DIAGNOSIS — Z79899 Other long term (current) drug therapy: Secondary | ICD-10-CM

## 2014-05-16 MED ORDER — HYDROCODONE-ACETAMINOPHEN 10-325 MG PO TABS: ORAL_TABLET | ORAL | Status: DC

## 2014-05-16 NOTE — Progress Notes (Signed)
Subjective:    Patient ID: Karina Greer, female    DOB: [birth year], 48 y.o.   MRN: [id number]  HPI: [name] is a 48 year old female who returns for follow up for chronic pain and medication refill. She says her pain is located in her lower back, right hip and right foot. She has an appointment with Dr. Lorin Mercy on 06/17/14 regarding her her right foot pain. She rates her pain 9. Her current exercise regime is performing stretching exercises and walking for short distances.  Pain Inventory Average Pain 7 Pain Right Now 9 My pain is constant, sharp, burning, stabbing and aching  In the last 24 hours, has pain interfered with the following? General activity 6 Relation with others 6 Enjoyment of life 8 What TIME of day is your pain at its worst? all Sleep (in general) Fair  Pain is worse with: walking, bending, sitting, inactivity, standing and some activites Pain improves with: rest, heat/ice, therapy/exercise, pacing activities and medication Relief from Meds: 6  Mobility walk without assistance how many minutes can you walk? 5 ability to climb steps?  yes do you drive?  yes  Function disabled: date disabled . I need assistance with the following:  meal prep, household duties and shopping  Neuro/Psych bladder control problems weakness numbness tingling dizziness depression anxiety  Prior Studies Any changes since last visit?  no  Physicians involved in your care Any changes since last visit?  no   Family History  Problem Relation Age of Onset  . Hypertension Mother   . Diabetes Father   . Fibromyalgia Sister   . Hypertension Other     Cancer, Cerebrovascular disease run on mother side of family   History   Social History  . Marital Status: Legally Separated    Spouse Name: N/A    Number of Children: 3  . Years of Education: HS   Occupational History  . Unemployed     Disability   Social History Main Topics  . Smoking status: Never Smoker    . Smokeless tobacco: Never Used  . Alcohol Use: No  . Drug Use: No  . Sexual Activity: None   Other Topics Concern  . None   Social History Narrative  . None   Past Surgical History  Procedure Laterality Date  . Laparoscopic ovarian cyst bx  2005    AND  URETEROSCOPIC LASER LITHO  STONE EXTRACTION  . Cysto/  urethral dilation/  hydrodistention/   instillation therapy  07-16-2010//   12-30-2007//   10-27-2006  . Transthoracic echocardiogram  06-07-2006    normal study/  ef 60-65%  . Exercise tolerence test  10-12-2010    NEGATIVE  ADEQUATE ETT/  NO ISCHEMIA OR EVIDENCE HIGH GRADE OBSTRUCTIVE CAD/  NO FURTHER TEST NEEDED  . Tubal ligation Bilateral 1995  . Shoulder open rotator cuff repair Right 2004  . Cholecystectomy  2014  . Hysteroscopy w/  novasure endometrial ablation  2014  . Cysto with hydrodistension N/A 01/26/2014    Procedure: CYSTOSCOPY/HYDRODISTENSION;  Surgeon: Bernestine Amass, MD;  Location: [facility];  Service: Urology;  Laterality: N/A;   Past Medical History  Diagnosis Date  . Fibromyalgia   . Depression   . Dyslipidemia   . Arthritis   . GERD (gastroesophageal reflux disease)   . Chronic low back pain   . History of renal calculi   . RLS (restless legs syndrome)   . Interstitial cystitis   . IBS (  irritable bowel syndrome)   . 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 PONV (postoperative nausea and vomiting)   . Anxiety disorder   . SI (sacroiliac) joint dysfunction   . SUI (stress urinary incontinence, female)   . Anxiety   . OSA on CPAP   . History of panic attacks   . Seasonal asthma   . Menorrhagia    BP 156/97  Pulse 73  Resp 14  Wt 224 lb 9.6 oz (101.878 kg)  SpO2 97%  Opioid Risk Score:   Fall Risk Score: Moderate Fall Risk (6-13 points) (previoulsy educated and given handout)   Review of Systems  Constitutional: Positive for appetite change.  Respiratory: Positive for apnea.   Gastrointestinal: Positive  for nausea and abdominal pain.  Skin: Positive for rash.  Neurological: Positive for numbness.       Tingling  All other systems reviewed and are negative.      Objective:   Physical Exam  Nursing note and vitals reviewed. Constitutional: She is oriented to person, place, and time. She appears well-developed and well-nourished.  HENT:  Head: Normocephalic and atraumatic.  Neck: Normal range of motion. Neck supple.  Cardiovascular: Normal rate and regular rhythm.   Pulmonary/Chest: Effort normal and breath sounds normal.  Musculoskeletal:  Normal Muscle Bulk and Muscle testing Reveals: Upper Extremities: Full ROM and Muscle strength 4/5 Spinal Forward Flexion 80 Degrees and extension 20 Degrees Lower Extremities: Full ROM and Muscle strength 5/5 Bilateral Lower extremities Flexion Produces Pain into Lumbar Arises from chair with ease Narrow based gait  Neurological: She is alert and oriented to person, place, and time.  Skin: Skin is warm and dry.  Psychiatric: She has a normal mood and affect.          Assessment & Plan:  1. Lumbago:  Refilled: HYDROcodone 10/325mg  one tablet every 6 hours as needed #120  2. Fibromyalgia. Continue Current exercise Regime  3. Anxiety and depression:Psychiatry/ Psychologist Following  4. Migraines: On Maxalt. Neurology Following  5. OSA : Continue voltaren gel, exercise regime and losing weight.  6. Obesity: Following Healthy Diet Regimen with Dietician,   20 minutes of face to face patient care time was spent during this visit. All questions were encouraged and answered.   F/U in 1 Month

## 2014-05-19 DIAGNOSIS — N3010 Interstitial cystitis (chronic) without hematuria: Secondary | ICD-10-CM | POA: Diagnosis not present

## 2014-05-19 DIAGNOSIS — M62838 Other muscle spasm: Secondary | ICD-10-CM | POA: Diagnosis not present

## 2014-05-19 DIAGNOSIS — R279 Unspecified lack of coordination: Secondary | ICD-10-CM | POA: Diagnosis not present

## 2014-05-19 DIAGNOSIS — N949 Unspecified condition associated with female genital organs and menstrual cycle: Secondary | ICD-10-CM | POA: Diagnosis not present

## 2014-05-19 DIAGNOSIS — J309 Allergic rhinitis, unspecified: Secondary | ICD-10-CM | POA: Diagnosis not present

## 2014-05-25 DIAGNOSIS — M66369 Spontaneous rupture of flexor tendons, unspecified lower leg: Secondary | ICD-10-CM | POA: Diagnosis not present

## 2014-05-26 DIAGNOSIS — F331 Major depressive disorder, recurrent, moderate: Secondary | ICD-10-CM | POA: Diagnosis not present

## 2014-05-26 DIAGNOSIS — F411 Generalized anxiety disorder: Secondary | ICD-10-CM | POA: Diagnosis not present

## 2014-06-09 DIAGNOSIS — J309 Allergic rhinitis, unspecified: Secondary | ICD-10-CM | POA: Diagnosis not present

## 2014-06-14 ENCOUNTER — Encounter: Payer: Self-pay | Admitting: Registered Nurse

## 2014-06-14 ENCOUNTER — Encounter: Payer: Medicare Other | Attending: Physical Medicine & Rehabilitation | Admitting: Registered Nurse

## 2014-06-14 VITALS — BP 148/91 | HR 78 | Resp 14 | Ht 63.0 in | Wt 220.0 lb

## 2014-06-14 DIAGNOSIS — G43009 Migraine without aura, not intractable, without status migrainosus: Secondary | ICD-10-CM | POA: Diagnosis not present

## 2014-06-14 DIAGNOSIS — Z5181 Encounter for therapeutic drug level monitoring: Secondary | ICD-10-CM | POA: Diagnosis not present

## 2014-06-14 DIAGNOSIS — E669 Obesity, unspecified: Secondary | ICD-10-CM | POA: Diagnosis not present

## 2014-06-14 DIAGNOSIS — M5430 Sciatica, unspecified side: Secondary | ICD-10-CM | POA: Diagnosis not present

## 2014-06-14 DIAGNOSIS — IMO0001 Reserved for inherently not codable concepts without codable children: Secondary | ICD-10-CM

## 2014-06-14 DIAGNOSIS — J189 Pneumonia, unspecified organism: Secondary | ICD-10-CM | POA: Insufficient documentation

## 2014-06-14 DIAGNOSIS — F329 Major depressive disorder, single episode, unspecified: Secondary | ICD-10-CM

## 2014-06-14 DIAGNOSIS — F3289 Other specified depressive episodes: Secondary | ICD-10-CM

## 2014-06-14 DIAGNOSIS — Z79899 Other long term (current) drug therapy: Secondary | ICD-10-CM

## 2014-06-14 DIAGNOSIS — F32A Depression, unspecified: Secondary | ICD-10-CM

## 2014-06-14 DIAGNOSIS — M5441 Lumbago with sciatica, right side: Secondary | ICD-10-CM

## 2014-06-14 MED ORDER — HYDROCODONE-ACETAMINOPHEN 10-325 MG PO TABS: ORAL_TABLET | ORAL | Status: DC

## 2014-06-14 NOTE — Progress Notes (Deleted)
   Subjective:    Patient ID: Karina Greer, female    DOB: Jun 13, 1966, 48 y.o.   MRN: [id number]  HPI    Review of Systems     Objective:   Physical Exam        Assessment & Plan:

## 2014-06-14 NOTE — Progress Notes (Signed)
Subjective:    Patient ID: Karina Greer, female    DOB: [birth year], 48 y.o.   MRN: [id number]  HPI: [name] is a 48 year old female who returns for follow up for chronic pain and medication refill. She says her pain is located in her lower back, right hip right leg and foot She rates her pain 10. Her current exercise regime is performing stretching exercises, chair exercises and walking for short distances.  She is wearing a Cam Boot on her right foot she states" Dr. Lorin Mercy says she has some torn tendons, she will be wearing the boot for 6 weeks and he will repeat the x-ray and decide whether she needs surgery.  Pain Inventory Average Pain 8 Pain Right Now 10 My pain is constant, sharp and burning  In the last 24 hours, has pain interfered with the following? General activity 10 Relation with others 10 Enjoyment of life 10 What TIME of day is your pain at its worst? All Sleep (in general) Fair  Pain is worse with: walking, bending, sitting, inactivity and standing Pain improves with: rest, heat/ice, therapy/exercise, pacing activities, medication and TENS Relief from Meds: 5  Mobility walk without assistance  Function disabled: date disabled 2005  Neuro/Psych bladder control problems spasms dizziness confusion  Prior Studies Any changes since last visit?  yes  Physicians involved in your care Any changes since last visit?  no   Family History  Problem Relation Age of Onset  . Hypertension Mother   . Diabetes Father   . Fibromyalgia Sister   . Hypertension Other     Cancer, Cerebrovascular disease run on mother side of family   History   Social History  . Marital Status: Legally Separated    Spouse Name: N/A    Number of Children: 3  . Years of Education: HS   Occupational History  . Unemployed     Disability   Social History Main Topics  . Smoking status: Never Smoker   . Smokeless tobacco: Never Used  . Alcohol Use: No  . Drug Use: No    . Sexual Activity: None   Other Topics Concern  . None   Social History Narrative  . None   Past Surgical History  Procedure Laterality Date  . Laparoscopic ovarian cyst bx  2005    AND  URETEROSCOPIC LASER LITHO  STONE EXTRACTION  . Cysto/  urethral dilation/  hydrodistention/   instillation therapy  07-16-2010//   12-30-2007//   10-27-2006  . Transthoracic echocardiogram  06-07-2006    normal study/  ef 60-65%  . Exercise tolerence test  10-12-2010    NEGATIVE  ADEQUATE ETT/  NO ISCHEMIA OR EVIDENCE HIGH GRADE OBSTRUCTIVE CAD/  NO FURTHER TEST NEEDED  . Tubal ligation Bilateral 1995  . Shoulder open rotator cuff repair Right 2004  . Cholecystectomy  2014  . Hysteroscopy w/  novasure endometrial ablation  2014  . Cysto with hydrodistension N/A 01/26/2014    Procedure: CYSTOSCOPY/HYDRODISTENSION;  Surgeon: Bernestine Amass, MD;  Location: [facility];  Service: Urology;  Laterality: N/A;   Past Medical History  Diagnosis Date  . Fibromyalgia   . Depression   . Dyslipidemia   . Arthritis   . GERD (gastroesophageal reflux disease)   . Chronic low back pain   . History of renal calculi   . RLS (restless legs syndrome)   . Interstitial cystitis   . IBS (irritable bowel syndrome)   .  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 PONV (postoperative nausea and vomiting)   . Anxiety disorder   . SI (sacroiliac) joint dysfunction   . SUI (stress urinary incontinence, female)   . Anxiety   . OSA on CPAP   . History of panic attacks   . Seasonal asthma   . Menorrhagia    BP 148/91  Pulse 78  Resp 14  Ht 5\' 3"  (1.6 m)  Wt 220 lb (99.791 kg)  BMI 38.98 kg/m2  SpO2 99%  Opioid Risk Score:   Fall Risk Score: Low Fall Risk (0-5 points)   Review of Systems     Objective:   Physical Exam  Nursing note and vitals reviewed. Constitutional: She is oriented to person, place, and time. She appears well-developed and well-nourished.  HENT:  Head:  Normocephalic and atraumatic.  Neck: Normal range of motion. Neck supple.  Cardiovascular: Normal rate and regular rhythm.   Pulmonary/Chest: Effort normal and breath sounds normal.  Musculoskeletal:  Normal Muscle Bulk and Muscle Testing Reveals: Upper Extremities: Full ROM and Muscle Strength 5/5 Lumbar Paraspinal Tenderness: L-3- L-5 Mainly Right Side Lower Extremities: Full ROM and Muscle Strength 5/5 Right Leg Flexion Produces Pain into Patella medial Arises from chair with ease Wide Based Gait   Neurological: She is alert and oriented to person, place, and time.  Skin: Skin is warm and dry.  Psychiatric: She has a normal mood and affect.          Assessment & Plan:  1. Lumbago:  Refilled: HYDROcodone 10/325mg  one tablet every 6 hours as needed #120  2. Fibromyalgia. Continue Current exercise Regime  3. Anxiety and depression:Psychiatry/ Psychologist Following  4. Migraines: On Maxalt. Neurology Following  5. OSA : Continue voltaren gel, exercise regime and losing weight.  6. Obesity: Following Healthy Diet Regimen with Dietician,   20 minutes of face to face patient care time was spent during this visit. All questions were encouraged and answered.   F/U in 1 Month

## 2014-06-16 DIAGNOSIS — G43009 Migraine without aura, not intractable, without status migrainosus: Secondary | ICD-10-CM | POA: Diagnosis not present

## 2014-06-16 DIAGNOSIS — Z23 Encounter for immunization: Secondary | ICD-10-CM | POA: Diagnosis not present

## 2014-06-16 DIAGNOSIS — R42 Dizziness and giddiness: Secondary | ICD-10-CM | POA: Diagnosis not present

## 2014-06-21 DIAGNOSIS — F331 Major depressive disorder, recurrent, moderate: Secondary | ICD-10-CM | POA: Diagnosis not present

## 2014-06-29 DIAGNOSIS — J301 Allergic rhinitis due to pollen: Secondary | ICD-10-CM | POA: Diagnosis not present

## 2014-06-29 DIAGNOSIS — J3081 Allergic rhinitis due to animal (cat) (dog) hair and dander: Secondary | ICD-10-CM | POA: Diagnosis not present

## 2014-06-29 DIAGNOSIS — J3089 Other allergic rhinitis: Secondary | ICD-10-CM | POA: Diagnosis not present

## 2014-07-06 DIAGNOSIS — M66361 Spontaneous rupture of flexor tendons, right lower leg: Secondary | ICD-10-CM | POA: Diagnosis not present

## 2014-07-12 ENCOUNTER — Encounter: Payer: Medicare Other | Attending: Physical Medicine & Rehabilitation | Admitting: Registered Nurse

## 2014-07-12 ENCOUNTER — Encounter: Payer: Self-pay | Admitting: Registered Nurse

## 2014-07-12 VITALS — BP 131/84 | HR 80 | Resp 14 | Ht 63.0 in | Wt 230.6 lb

## 2014-07-12 DIAGNOSIS — N920 Excessive and frequent menstruation with regular cycle: Secondary | ICD-10-CM | POA: Insufficient documentation

## 2014-07-12 DIAGNOSIS — M25551 Pain in right hip: Secondary | ICD-10-CM | POA: Insufficient documentation

## 2014-07-12 DIAGNOSIS — E669 Obesity, unspecified: Secondary | ICD-10-CM | POA: Diagnosis not present

## 2014-07-12 DIAGNOSIS — G43909 Migraine, unspecified, not intractable, without status migrainosus: Secondary | ICD-10-CM | POA: Insufficient documentation

## 2014-07-12 DIAGNOSIS — M79671 Pain in right foot: Secondary | ICD-10-CM | POA: Insufficient documentation

## 2014-07-12 DIAGNOSIS — N393 Stress incontinence (female) (male): Secondary | ICD-10-CM | POA: Diagnosis not present

## 2014-07-12 DIAGNOSIS — N3010 Interstitial cystitis (chronic) without hematuria: Secondary | ICD-10-CM | POA: Insufficient documentation

## 2014-07-12 DIAGNOSIS — G4733 Obstructive sleep apnea (adult) (pediatric): Secondary | ICD-10-CM | POA: Insufficient documentation

## 2014-07-12 DIAGNOSIS — F32A Depression, unspecified: Secondary | ICD-10-CM

## 2014-07-12 DIAGNOSIS — G8929 Other chronic pain: Secondary | ICD-10-CM | POA: Diagnosis not present

## 2014-07-12 DIAGNOSIS — L918 Other hypertrophic disorders of the skin: Secondary | ICD-10-CM | POA: Diagnosis not present

## 2014-07-12 DIAGNOSIS — Z87442 Personal history of urinary calculi: Secondary | ICD-10-CM | POA: Diagnosis not present

## 2014-07-12 DIAGNOSIS — J45998 Other asthma: Secondary | ICD-10-CM | POA: Insufficient documentation

## 2014-07-12 DIAGNOSIS — G2581 Restless legs syndrome: Secondary | ICD-10-CM | POA: Insufficient documentation

## 2014-07-12 DIAGNOSIS — N926 Irregular menstruation, unspecified: Secondary | ICD-10-CM | POA: Diagnosis not present

## 2014-07-12 DIAGNOSIS — F418 Other specified anxiety disorders: Secondary | ICD-10-CM | POA: Diagnosis not present

## 2014-07-12 DIAGNOSIS — M797 Fibromyalgia: Secondary | ICD-10-CM | POA: Diagnosis not present

## 2014-07-12 DIAGNOSIS — M1990 Unspecified osteoarthritis, unspecified site: Secondary | ICD-10-CM | POA: Insufficient documentation

## 2014-07-12 DIAGNOSIS — Z760 Encounter for issue of repeat prescription: Secondary | ICD-10-CM | POA: Diagnosis not present

## 2014-07-12 DIAGNOSIS — M545 Low back pain: Secondary | ICD-10-CM | POA: Diagnosis not present

## 2014-07-12 DIAGNOSIS — M791 Myalgia: Secondary | ICD-10-CM

## 2014-07-12 DIAGNOSIS — E785 Hyperlipidemia, unspecified: Secondary | ICD-10-CM | POA: Diagnosis not present

## 2014-07-12 DIAGNOSIS — M5441 Lumbago with sciatica, right side: Secondary | ICD-10-CM | POA: Diagnosis not present

## 2014-07-12 DIAGNOSIS — Z79899 Other long term (current) drug therapy: Secondary | ICD-10-CM

## 2014-07-12 DIAGNOSIS — K219 Gastro-esophageal reflux disease without esophagitis: Secondary | ICD-10-CM | POA: Insufficient documentation

## 2014-07-12 DIAGNOSIS — IMO0001 Reserved for inherently not codable concepts without codable children: Secondary | ICD-10-CM

## 2014-07-12 DIAGNOSIS — K589 Irritable bowel syndrome without diarrhea: Secondary | ICD-10-CM | POA: Insufficient documentation

## 2014-07-12 DIAGNOSIS — F329 Major depressive disorder, single episode, unspecified: Secondary | ICD-10-CM

## 2014-07-12 DIAGNOSIS — R21 Rash and other nonspecific skin eruption: Secondary | ICD-10-CM | POA: Diagnosis not present

## 2014-07-12 DIAGNOSIS — Z5181 Encounter for therapeutic drug level monitoring: Secondary | ICD-10-CM

## 2014-07-12 DIAGNOSIS — M609 Myositis, unspecified: Secondary | ICD-10-CM

## 2014-07-12 MED ORDER — HYDROCODONE-ACETAMINOPHEN 10-325 MG PO TABS: ORAL_TABLET | ORAL | Status: DC

## 2014-07-12 NOTE — Progress Notes (Signed)
Subjective:    Patient ID: Karina Greer, female    DOB: 11-06-1965, 48 y.o.   MRN: [id number]  HPI: Karina Greer is a 48 year old female who returns for follow up for chronic pain and medication refill. She says her pain is located in her lower back, right hip and right foot. She rates her pain 9. Her current exercise regime is performing stretching exercises, and walking for short distances.  She is still wearing her Cam Boot on her right foot she states" Dr. Lorin Mercy wants her to keep the boot on for additional 4 weeks.   Pain Inventory Average Pain 8 Pain Right Now 9 My pain is constant, sharp, burning and tingling  In the last 24 hours, has pain interfered with the following? General activity 8 Relation with others 8 Enjoyment of life 9 What TIME of day is your pain at its worst? ALL Sleep (in general) Fair  Pain is worse with: walking, bending, sitting, inactivity, standing and some activites Pain improves with: rest, heat/ice, therapy/exercise, pacing activities, medication, TENS and injections Relief from Meds: 5  Mobility walk without assistance how many minutes can you walk? 10 ability to climb steps?  no do you drive?  yes Function disabled: date disabled 2005  Neuro/Psych bladder control problems weakness tingling dizziness confusion depression anxiety  Prior Studies Any changes since last visit?  no  Physicians involved in your care Any changes since last visit?  no   Family History  Problem Relation Age of Onset  . Hypertension Mother   . Diabetes Father   . Fibromyalgia Sister   . Hypertension Other     Cancer, Cerebrovascular disease run on mother side of family   History   Social History  . Marital Status: Legally Separated    Spouse Name: N/A    Number of Children: 3  . Years of Education: HS   Occupational History  . Unemployed     Disability   Social History Main Topics  . Smoking status: Never Smoker   . Smokeless  tobacco: Never Used  . Alcohol Use: No  . Drug Use: No  . Sexual Activity: None   Other Topics Concern  . None   Social History Narrative  . None   Past Surgical History  Procedure Laterality Date  . Laparoscopic ovarian cyst bx  2005    AND  URETEROSCOPIC LASER LITHO  STONE EXTRACTION  . Cysto/  urethral dilation/  hydrodistention/   instillation therapy  07-16-2010//   12-30-2007//   10-27-2006  . Transthoracic echocardiogram  06-07-2006    normal study/  ef 60-65%  . Exercise tolerence test  10-12-2010    NEGATIVE  ADEQUATE ETT/  NO ISCHEMIA OR EVIDENCE HIGH GRADE OBSTRUCTIVE CAD/  NO FURTHER TEST NEEDED  . Tubal ligation Bilateral 1995  . Shoulder open rotator cuff repair Right 2004  . Cholecystectomy  2014  . Hysteroscopy w/  novasure endometrial ablation  2014  . Cysto with hydrodistension N/A 01/26/2014    Procedure: CYSTOSCOPY/HYDRODISTENSION;  Surgeon: Bernestine Amass, MD;  Location: [facility];  Service: Urology;  Laterality: N/A;   Past Medical History  Diagnosis Date  . Fibromyalgia   . Depression   . Dyslipidemia   . Arthritis   . GERD (gastroesophageal reflux disease)   . Chronic low back pain   . History of renal calculi   . RLS (restless legs syndrome)   . Interstitial cystitis   . IBS (  irritable bowel syndrome)   . 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 PONV (postoperative nausea and vomiting)   . Anxiety disorder   . SI (sacroiliac) joint dysfunction   . SUI (stress urinary incontinence, female)   . Anxiety   . OSA on CPAP   . History of panic attacks   . Seasonal asthma   . Menorrhagia    BP 131/84  Pulse 80  Resp 14  Ht 5\' 3"  (1.6 m)  Wt 230 lb 9.6 oz (104.599 kg)  BMI 40.86 kg/m2  SpO2 96%  Opioid Risk Score:   Fall Risk Score: Low Fall Risk (0-5 points)  Review of Systems     Objective:   Physical Exam  Nursing note and vitals reviewed. Constitutional: She is oriented to person, place, and time. She  appears well-developed and well-nourished.  HENT:  Head: Normocephalic and atraumatic.  Neck: Normal range of motion. Neck supple.  Cardiovascular: Normal rate and regular rhythm.   Pulmonary/Chest: Effort normal and breath sounds normal.  Musculoskeletal:  Normal Muscle Bulk and Muscle Testing Reveals: Upper Extremities: Full ROM and Muscle Strength 5/5 Lumbar Paraspinal Tenderness: L-3- L-5 Lower Extremities: Right Cam Boot Intact/ Flexion Produces Pain into Lower Back and Muscle Strength 4/5 Left Full ROM and Muscle Strength 5/5 Arises from chair with ease Narrow based Gait  Neurological: She is alert and oriented to person, place, and time.  Skin: Skin is warm and dry.  Psychiatric: She has a normal mood and affect.          Assessment & Plan:  1. Lumbago:  Refilled: HYDROcodone 10/325mg  one tablet every 6 hours as needed #120  2. Fibromyalgia. Continue Current exercise Regime  3. Anxiety and depression: On Xanax: Psychiatry/ Psychologist Following  4. Migraines: On Maxalt. Neurology Following  5. OSA : Continue voltaren gel, exercise regime and losing weight.  6. Obesity: Following Healthy Diet Regimen with Dietician,  20 minutes of face to face patient care time was spent during this visit. All questions were encouraged and answered.  F/U in 1 Month

## 2014-07-13 DIAGNOSIS — G44221 Chronic tension-type headache, intractable: Secondary | ICD-10-CM | POA: Insufficient documentation

## 2014-07-13 DIAGNOSIS — R202 Paresthesia of skin: Secondary | ICD-10-CM | POA: Diagnosis not present

## 2014-07-13 DIAGNOSIS — G44229 Chronic tension-type headache, not intractable: Secondary | ICD-10-CM | POA: Diagnosis not present

## 2014-07-13 DIAGNOSIS — J301 Allergic rhinitis due to pollen: Secondary | ICD-10-CM | POA: Diagnosis not present

## 2014-07-13 DIAGNOSIS — J3089 Other allergic rhinitis: Secondary | ICD-10-CM | POA: Diagnosis not present

## 2014-07-13 DIAGNOSIS — M542 Cervicalgia: Secondary | ICD-10-CM | POA: Diagnosis not present

## 2014-07-13 DIAGNOSIS — J3081 Allergic rhinitis due to animal (cat) (dog) hair and dander: Secondary | ICD-10-CM | POA: Diagnosis not present

## 2014-07-13 DIAGNOSIS — E669 Obesity, unspecified: Secondary | ICD-10-CM | POA: Diagnosis not present

## 2014-07-15 DIAGNOSIS — L918 Other hypertrophic disorders of the skin: Secondary | ICD-10-CM | POA: Diagnosis not present

## 2014-07-21 DIAGNOSIS — F331 Major depressive disorder, recurrent, moderate: Secondary | ICD-10-CM | POA: Diagnosis not present

## 2014-07-26 DIAGNOSIS — F331 Major depressive disorder, recurrent, moderate: Secondary | ICD-10-CM | POA: Diagnosis not present

## 2014-07-27 DIAGNOSIS — J3081 Allergic rhinitis due to animal (cat) (dog) hair and dander: Secondary | ICD-10-CM | POA: Diagnosis not present

## 2014-07-27 DIAGNOSIS — J301 Allergic rhinitis due to pollen: Secondary | ICD-10-CM | POA: Diagnosis not present

## 2014-07-27 DIAGNOSIS — J3089 Other allergic rhinitis: Secondary | ICD-10-CM | POA: Diagnosis not present

## 2014-07-28 DIAGNOSIS — L299 Pruritus, unspecified: Secondary | ICD-10-CM | POA: Diagnosis not present

## 2014-07-28 DIAGNOSIS — L719 Rosacea, unspecified: Secondary | ICD-10-CM | POA: Diagnosis not present

## 2014-07-28 DIAGNOSIS — R233 Spontaneous ecchymoses: Secondary | ICD-10-CM | POA: Diagnosis not present

## 2014-08-08 DIAGNOSIS — G44221 Chronic tension-type headache, intractable: Secondary | ICD-10-CM | POA: Diagnosis not present

## 2014-08-08 DIAGNOSIS — N951 Menopausal and female climacteric states: Secondary | ICD-10-CM | POA: Insufficient documentation

## 2014-08-08 DIAGNOSIS — G4489 Other headache syndrome: Secondary | ICD-10-CM | POA: Diagnosis not present

## 2014-08-08 DIAGNOSIS — Z01419 Encounter for gynecological examination (general) (routine) without abnormal findings: Secondary | ICD-10-CM | POA: Diagnosis not present

## 2014-08-08 DIAGNOSIS — F329 Major depressive disorder, single episode, unspecified: Secondary | ICD-10-CM | POA: Diagnosis not present

## 2014-08-08 DIAGNOSIS — R42 Dizziness and giddiness: Secondary | ICD-10-CM | POA: Insufficient documentation

## 2014-08-08 DIAGNOSIS — Z124 Encounter for screening for malignant neoplasm of cervix: Secondary | ICD-10-CM | POA: Diagnosis not present

## 2014-08-08 DIAGNOSIS — R202 Paresthesia of skin: Secondary | ICD-10-CM | POA: Diagnosis not present

## 2014-08-10 ENCOUNTER — Other Ambulatory Visit: Payer: Self-pay | Admitting: Orthopaedic Surgery

## 2014-08-10 ENCOUNTER — Encounter: Payer: Medicare Other | Attending: Physical Medicine & Rehabilitation | Admitting: Registered Nurse

## 2014-08-10 ENCOUNTER — Encounter: Payer: Self-pay | Admitting: Registered Nurse

## 2014-08-10 ENCOUNTER — Ambulatory Visit
Admission: RE | Admit: 2014-08-10 | Discharge: 2014-08-10 | Disposition: A | Discharge status: Home / Self Care | Payer: Medicare Other | Source: Ambulatory Visit | Attending: Orthopaedic Surgery | Admitting: Orthopaedic Surgery

## 2014-08-10 VITALS — BP 150/97 | HR 78 | Resp 14 | Ht 63.0 in | Wt 228.8 lb

## 2014-08-10 DIAGNOSIS — M79671 Pain in right foot: Secondary | ICD-10-CM | POA: Insufficient documentation

## 2014-08-10 DIAGNOSIS — Z79899 Other long term (current) drug therapy: Secondary | ICD-10-CM | POA: Diagnosis not present

## 2014-08-10 DIAGNOSIS — M545 Low back pain: Secondary | ICD-10-CM | POA: Diagnosis not present

## 2014-08-10 DIAGNOSIS — M1990 Unspecified osteoarthritis, unspecified site: Secondary | ICD-10-CM | POA: Diagnosis not present

## 2014-08-10 DIAGNOSIS — E785 Hyperlipidemia, unspecified: Secondary | ICD-10-CM | POA: Diagnosis not present

## 2014-08-10 DIAGNOSIS — Z87442 Personal history of urinary calculi: Secondary | ICD-10-CM | POA: Diagnosis not present

## 2014-08-10 DIAGNOSIS — G8929 Other chronic pain: Secondary | ICD-10-CM | POA: Diagnosis not present

## 2014-08-10 DIAGNOSIS — Z5181 Encounter for therapeutic drug level monitoring: Secondary | ICD-10-CM

## 2014-08-10 DIAGNOSIS — G43909 Migraine, unspecified, not intractable, without status migrainosus: Secondary | ICD-10-CM | POA: Diagnosis not present

## 2014-08-10 DIAGNOSIS — K219 Gastro-esophageal reflux disease without esophagitis: Secondary | ICD-10-CM | POA: Diagnosis not present

## 2014-08-10 DIAGNOSIS — N920 Excessive and frequent menstruation with regular cycle: Secondary | ICD-10-CM | POA: Insufficient documentation

## 2014-08-10 DIAGNOSIS — M797 Fibromyalgia: Secondary | ICD-10-CM | POA: Diagnosis not present

## 2014-08-10 DIAGNOSIS — Z760 Encounter for issue of repeat prescription: Secondary | ICD-10-CM | POA: Insufficient documentation

## 2014-08-10 DIAGNOSIS — M609 Myositis, unspecified: Secondary | ICD-10-CM

## 2014-08-10 DIAGNOSIS — N3010 Interstitial cystitis (chronic) without hematuria: Secondary | ICD-10-CM | POA: Insufficient documentation

## 2014-08-10 DIAGNOSIS — M5441 Lumbago with sciatica, right side: Secondary | ICD-10-CM

## 2014-08-10 DIAGNOSIS — F418 Other specified anxiety disorders: Secondary | ICD-10-CM | POA: Insufficient documentation

## 2014-08-10 DIAGNOSIS — G2581 Restless legs syndrome: Secondary | ICD-10-CM | POA: Insufficient documentation

## 2014-08-10 DIAGNOSIS — K589 Irritable bowel syndrome without diarrhea: Secondary | ICD-10-CM | POA: Insufficient documentation

## 2014-08-10 DIAGNOSIS — N393 Stress incontinence (female) (male): Secondary | ICD-10-CM | POA: Insufficient documentation

## 2014-08-10 DIAGNOSIS — IMO0001 Reserved for inherently not codable concepts without codable children: Secondary | ICD-10-CM

## 2014-08-10 DIAGNOSIS — F329 Major depressive disorder, single episode, unspecified: Secondary | ICD-10-CM | POA: Diagnosis not present

## 2014-08-10 DIAGNOSIS — G4733 Obstructive sleep apnea (adult) (pediatric): Secondary | ICD-10-CM | POA: Diagnosis not present

## 2014-08-10 DIAGNOSIS — M25571 Pain in right ankle and joints of right foot: Secondary | ICD-10-CM

## 2014-08-10 DIAGNOSIS — J45998 Other asthma: Secondary | ICD-10-CM | POA: Diagnosis not present

## 2014-08-10 DIAGNOSIS — M25551 Pain in right hip: Secondary | ICD-10-CM | POA: Diagnosis not present

## 2014-08-10 DIAGNOSIS — M791 Myalgia: Secondary | ICD-10-CM

## 2014-08-10 DIAGNOSIS — E669 Obesity, unspecified: Secondary | ICD-10-CM

## 2014-08-10 DIAGNOSIS — F32A Depression, unspecified: Secondary | ICD-10-CM

## 2014-08-10 MED ORDER — HYDROCODONE-ACETAMINOPHEN 10-325 MG PO TABS: ORAL_TABLET | ORAL | Status: DC

## 2014-08-10 NOTE — Progress Notes (Signed)
Subjective:    Patient ID: Karina Greer, female    DOB: Nov 29, 1965, 48 y.o.   MRN: [id number]  HPI: [name] is a 48 year old female who returns for follow up for chronic pain and medication refill. She says her pain is located in her bilateral arms,right hip, right leg and right foot. She rates her pain 9. Her current exercise regime is performing stretching exercises, and walking in her yard. She's wearing her Cam Boot on her right foot she states" Dr. Lorin Mercy wants her to keep the boot on until after the results of the MRI. She is scheduled for MRI on right foot today at [facility]. She stated" she has been having chest pain and bilateral arm pain and relates it to her anxiety". She states" her psychiatrist decreased her Xanax and she's having a hard time adjusting. She's  going to counseling, and seeing the psychiatrist.Also was told the psychiatrist is no longer at the practice and states"their not listening to me". Her GYN referred  her to a new psychiatrist she is awaiting a call back. I educated her on the importance of seeking medical attention for chest pain. She verbalizes understanding. She denies any chest pain at this time admits she is very anxious and tearful. She denies wanting to harm herself, she does not have a plan. Emotional support given, all questions answered. She fell last Friday 08/05/14 she tripped over her feet and hit her head on the car she didn't seek medical attention. The pain increased in intensity over the weekend, she called Dr. Melrose Nakayama the neurologist and was seen on 08/08/2014 she stated "she was told she had a concussion and medrol dose pak was given".   Pain Inventory Average Pain 8 Pain Right Now 9 My pain is constant, burning, dull, stabbing, tingling and aching  In the last 24 hours, has pain interfered with the following? General activity 10 Relation with others 10 Enjoyment of life 10 What TIME of day is your pain at its worst? All  Day and night Sleep (in general) Poor  Pain is worse with: walking, bending, sitting, standing and some activites Pain improves with: rest, heat/ice, therapy/exercise, pacing activities, medication and TENS Relief from Meds: 6  Mobility walk without assistance how many minutes can you walk? 10 ability to climb steps?  yes do you drive?  yes  Function disabled: date disabled .  Neuro/Psych bladder control problems weakness numbness tingling trouble walking dizziness confusion depression anxiety  Prior Studies Any changes since last visit?  no  Physicians involved in your care Any changes since last visit?  no   Family History  Problem Relation Age of Onset  . Hypertension Mother   . Diabetes Father   . Fibromyalgia Sister   . Hypertension Other     Cancer, Cerebrovascular disease run on mother side of family   History   Social History  . Marital Status: Legally Separated    Spouse Name: N/A    Number of Children: 3  . Years of Education: HS   Occupational History  . Unemployed     Disability   Social History Main Topics  . Smoking status: Never Smoker   . Smokeless tobacco: Never Used  . Alcohol Use: No  . Drug Use: No  . Sexual Activity: None   Other Topics Concern  . None   Social History Narrative   Past Surgical History  Procedure Laterality Date  . Laparoscopic ovarian cyst bx  2005    AND  URETEROSCOPIC LASER LITHO  STONE EXTRACTION  . Cysto/  urethral dilation/  hydrodistention/   instillation therapy  07-16-2010//   12-30-2007//   10-27-2006  . Transthoracic echocardiogram  06-07-2006    normal study/  ef 60-65%  . Exercise tolerence test  10-12-2010    NEGATIVE  ADEQUATE ETT/  NO ISCHEMIA OR EVIDENCE HIGH GRADE OBSTRUCTIVE CAD/  NO FURTHER TEST NEEDED  . Tubal ligation Bilateral 1995  . Shoulder open rotator cuff repair Right 2004  . Cholecystectomy  2014  . Hysteroscopy w/  novasure endometrial ablation  2014  . Cysto with  hydrodistension N/A 01/26/2014    Procedure: CYSTOSCOPY/HYDRODISTENSION;  Surgeon: Bernestine Amass, MD;  Location: [facility];  Service: Urology;  Laterality: N/A;   Past Medical History  Diagnosis Date  . Fibromyalgia   . Depression   . Dyslipidemia   . Arthritis   . GERD (gastroesophageal reflux disease)   . Chronic low back pain   . History of renal calculi   . RLS (restless legs syndrome)   . Interstitial cystitis   . IBS (irritable bowel syndrome)   . 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 PONV (postoperative nausea and vomiting)   . Anxiety disorder   . SI (sacroiliac) joint dysfunction   . SUI (stress urinary incontinence, female)   . Anxiety   . OSA on CPAP   . History of panic attacks   . Seasonal asthma   . Menorrhagia    BP 150/97 mmHg  Pulse 78  Resp 14  Ht 5\' 3"  (1.6 m)  Wt 228 lb 12.8 oz (103.783 kg)  BMI 40.54 kg/m2  SpO2 98%  Opioid Risk Score:   Fall Risk Score: Moderate Fall Risk (6-13 points)  Review of Systems  Constitutional: Positive for fatigue.  HENT: Negative.   Eyes: Negative.   Respiratory: Negative.   Cardiovascular: Negative.   Gastrointestinal: Negative.   Endocrine: Negative.   Genitourinary:       Bladder control problems  Musculoskeletal: Positive for myalgias, back pain, joint swelling, arthralgias and neck pain.  Skin: Negative.   Allergic/Immunologic: Negative.   Neurological: Positive for dizziness, weakness and numbness.       Tingling, trouble walking, dizziness  Hematological: Negative.   Psychiatric/Behavioral: Positive for dysphoric mood. The patient is nervous/anxious.        Objective:   Physical Exam  Constitutional: She is oriented to person, place, and time. She appears well-developed and well-nourished.  HENT:  Head: Normocephalic and atraumatic.  Neck: Normal range of motion. Neck supple.  Cardiovascular: Normal rate.   Pulmonary/Chest: Effort normal and breath sounds normal.    Musculoskeletal:  Normal Muscle Bulk and Muscle testing Reveals: Upper Extremities:Left:Full ROM and Muscle Strength 5/5 Right Muscle Strength 4/5 Hypersensitivity Thoracic and Lumbar Lower Extremities: Right with Cam Boot/ Full ROM and Muscle strength 4/5 Left Full ROM and Muscle strength 5/5 Arises from chair with ease   Neurological: She is alert and oriented to person, place, and time.  Skin: Skin is warm and dry.  Psychiatric: She has a normal mood and affect.  Nursing note and vitals reviewed.         Assessment & Plan:  1. Lumbago:  Refilled: HYDROcodone 10/325mg  one tablet every 6 hours as needed #120  2. Fibromyalgia. Continue Current exercise Regime  3. Anxiety and depression: On Xanax: Psychiatry/ Psychologist Following . She's obtaining a new Psychiatrist 4. Migraines: On Maxalt.  Neurology Following  5. OSA : Continue voltaren gel, exercise regime and losing weight.  6. Obesity: Following Healthy Diet Regimen with Dietician,   20 minutes of face to face patient care time was spent during this visit. All questions were encouraged and answered.   F/U in 1 Month

## 2014-08-11 DIAGNOSIS — M79675 Pain in left toe(s): Secondary | ICD-10-CM | POA: Diagnosis not present

## 2014-08-11 DIAGNOSIS — R0789 Other chest pain: Secondary | ICD-10-CM | POA: Diagnosis not present

## 2014-08-11 DIAGNOSIS — F418 Other specified anxiety disorders: Secondary | ICD-10-CM | POA: Diagnosis not present

## 2014-08-15 DIAGNOSIS — J3081 Allergic rhinitis due to animal (cat) (dog) hair and dander: Secondary | ICD-10-CM | POA: Diagnosis not present

## 2014-08-15 DIAGNOSIS — J301 Allergic rhinitis due to pollen: Secondary | ICD-10-CM | POA: Diagnosis not present

## 2014-08-15 DIAGNOSIS — J3089 Other allergic rhinitis: Secondary | ICD-10-CM | POA: Diagnosis not present

## 2014-08-16 DIAGNOSIS — M66361 Spontaneous rupture of flexor tendons, right lower leg: Secondary | ICD-10-CM | POA: Diagnosis not present

## 2014-08-19 DIAGNOSIS — R300 Dysuria: Secondary | ICD-10-CM | POA: Diagnosis not present

## 2014-08-24 DIAGNOSIS — J3089 Other allergic rhinitis: Secondary | ICD-10-CM | POA: Diagnosis not present

## 2014-08-24 DIAGNOSIS — J301 Allergic rhinitis due to pollen: Secondary | ICD-10-CM | POA: Diagnosis not present

## 2014-08-24 DIAGNOSIS — J3081 Allergic rhinitis due to animal (cat) (dog) hair and dander: Secondary | ICD-10-CM | POA: Diagnosis not present

## 2014-08-26 DIAGNOSIS — R42 Dizziness and giddiness: Secondary | ICD-10-CM | POA: Diagnosis not present

## 2014-08-26 DIAGNOSIS — E780 Pure hypercholesterolemia: Secondary | ICD-10-CM | POA: Diagnosis not present

## 2014-08-26 DIAGNOSIS — N390 Urinary tract infection, site not specified: Secondary | ICD-10-CM | POA: Diagnosis not present

## 2014-09-07 ENCOUNTER — Other Ambulatory Visit: Payer: Self-pay | Admitting: Physical Medicine & Rehabilitation

## 2014-09-07 ENCOUNTER — Encounter: Payer: Medicare Other | Attending: Physical Medicine & Rehabilitation | Admitting: Registered Nurse

## 2014-09-07 ENCOUNTER — Encounter: Payer: Self-pay | Admitting: Registered Nurse

## 2014-09-07 VITALS — BP 139/101 | HR 94 | Resp 14

## 2014-09-07 DIAGNOSIS — M25551 Pain in right hip: Secondary | ICD-10-CM | POA: Diagnosis not present

## 2014-09-07 DIAGNOSIS — N3010 Interstitial cystitis (chronic) without hematuria: Secondary | ICD-10-CM | POA: Diagnosis not present

## 2014-09-07 DIAGNOSIS — M797 Fibromyalgia: Secondary | ICD-10-CM | POA: Diagnosis not present

## 2014-09-07 DIAGNOSIS — J3081 Allergic rhinitis due to animal (cat) (dog) hair and dander: Secondary | ICD-10-CM | POA: Diagnosis not present

## 2014-09-07 DIAGNOSIS — IMO0001 Reserved for inherently not codable concepts without codable children: Secondary | ICD-10-CM

## 2014-09-07 DIAGNOSIS — F418 Other specified anxiety disorders: Secondary | ICD-10-CM | POA: Insufficient documentation

## 2014-09-07 DIAGNOSIS — N200 Calculus of kidney: Secondary | ICD-10-CM | POA: Diagnosis not present

## 2014-09-07 DIAGNOSIS — F32A Depression, unspecified: Secondary | ICD-10-CM

## 2014-09-07 DIAGNOSIS — M1990 Unspecified osteoarthritis, unspecified site: Secondary | ICD-10-CM | POA: Insufficient documentation

## 2014-09-07 DIAGNOSIS — Z760 Encounter for issue of repeat prescription: Secondary | ICD-10-CM | POA: Insufficient documentation

## 2014-09-07 DIAGNOSIS — M545 Low back pain: Secondary | ICD-10-CM | POA: Insufficient documentation

## 2014-09-07 DIAGNOSIS — J45998 Other asthma: Secondary | ICD-10-CM | POA: Insufficient documentation

## 2014-09-07 DIAGNOSIS — E669 Obesity, unspecified: Secondary | ICD-10-CM | POA: Diagnosis not present

## 2014-09-07 DIAGNOSIS — G8929 Other chronic pain: Secondary | ICD-10-CM | POA: Insufficient documentation

## 2014-09-07 DIAGNOSIS — N920 Excessive and frequent menstruation with regular cycle: Secondary | ICD-10-CM | POA: Insufficient documentation

## 2014-09-07 DIAGNOSIS — Z87442 Personal history of urinary calculi: Secondary | ICD-10-CM | POA: Diagnosis not present

## 2014-09-07 DIAGNOSIS — G2581 Restless legs syndrome: Secondary | ICD-10-CM | POA: Diagnosis not present

## 2014-09-07 DIAGNOSIS — M5441 Lumbago with sciatica, right side: Secondary | ICD-10-CM

## 2014-09-07 DIAGNOSIS — G43909 Migraine, unspecified, not intractable, without status migrainosus: Secondary | ICD-10-CM | POA: Diagnosis not present

## 2014-09-07 DIAGNOSIS — R109 Unspecified abdominal pain: Secondary | ICD-10-CM | POA: Diagnosis not present

## 2014-09-07 DIAGNOSIS — N393 Stress incontinence (female) (male): Secondary | ICD-10-CM | POA: Diagnosis not present

## 2014-09-07 DIAGNOSIS — G4733 Obstructive sleep apnea (adult) (pediatric): Secondary | ICD-10-CM | POA: Insufficient documentation

## 2014-09-07 DIAGNOSIS — K219 Gastro-esophageal reflux disease without esophagitis: Secondary | ICD-10-CM | POA: Insufficient documentation

## 2014-09-07 DIAGNOSIS — E785 Hyperlipidemia, unspecified: Secondary | ICD-10-CM | POA: Insufficient documentation

## 2014-09-07 DIAGNOSIS — M609 Myositis, unspecified: Secondary | ICD-10-CM

## 2014-09-07 DIAGNOSIS — Z5181 Encounter for therapeutic drug level monitoring: Secondary | ICD-10-CM | POA: Diagnosis not present

## 2014-09-07 DIAGNOSIS — M791 Myalgia: Secondary | ICD-10-CM

## 2014-09-07 DIAGNOSIS — K589 Irritable bowel syndrome without diarrhea: Secondary | ICD-10-CM | POA: Diagnosis not present

## 2014-09-07 DIAGNOSIS — Z79899 Other long term (current) drug therapy: Secondary | ICD-10-CM

## 2014-09-07 DIAGNOSIS — G894 Chronic pain syndrome: Secondary | ICD-10-CM | POA: Diagnosis not present

## 2014-09-07 DIAGNOSIS — M79671 Pain in right foot: Secondary | ICD-10-CM | POA: Insufficient documentation

## 2014-09-07 DIAGNOSIS — J3089 Other allergic rhinitis: Secondary | ICD-10-CM | POA: Diagnosis not present

## 2014-09-07 DIAGNOSIS — F329 Major depressive disorder, single episode, unspecified: Secondary | ICD-10-CM

## 2014-09-07 DIAGNOSIS — J301 Allergic rhinitis due to pollen: Secondary | ICD-10-CM | POA: Diagnosis not present

## 2014-09-07 MED ORDER — HYDROCODONE-ACETAMINOPHEN 10-325 MG PO TABS: ORAL_TABLET | ORAL | Status: DC

## 2014-09-07 NOTE — Progress Notes (Signed)
Subjective:    Patient ID: Karina Greer, female    DOB: August 05, 1966, 48 y.o.   MRN: [id number]  HPI: Karina Greer is a 48 year old female who returns for follow up for chronic pain and medication refill. She says her pain is located in her lower back and right hip. She rates her pain 9. Her current exercise regime is performing stretching exercises, and walking.  She fell up the stairs 4 days ago lost her footing. Also fell in her bathroom she states she tripped over her foot, she wasn't wearing her cam boot. She was educated on falls prevention and verbalizes understanding. Also instructed to wear her cam boot as directed by Dr. Lorin Mercy.  She's very emotional today still looking for a new psychiatrist and psychologist her PCP is assisting in this matter. Emotional support given. Admits to being anxious and having panic attack her PCP increased her Xanax. She denies any suicidal ideation or plans.  She arrived to office hypertensive 139/101 blood pressure was re-checked 149/104. She refuses to go to the ED, she states" she will call her PCP Today for a follow-up appointment. Encouraged to keep a blood pressure log and she verbalizes understanding.   Pain Inventory Average Pain 7 Pain Right Now 9 My pain is constant, sharp, dull, stabbing, tingling and aching  In the last 24 hours, has pain interfered with the following? General activity 6 Relation with others 8 Enjoyment of life 8 What TIME of day is your pain at its worst? all Sleep (in general) Poor  Pain is worse with: walking, bending, sitting, inactivity, standing and some activites Pain improves with: rest, heat/ice, therapy/exercise, pacing activities, medication and TENS Relief from Meds: 5  Mobility walk without assistance how many minutes can you walk? 10 ability to climb steps?  yes do you drive?  yes Do you have any goals in this area?  yes  Function disabled: date disabled . I need assistance with the  following:  meal prep, household duties and shopping Do you have any goals in this area?  yes  Neuro/Psych bladder control problems weakness dizziness confusion depression anxiety  Prior Studies Any changes since last visit?  no  Physicians involved in your care Any changes since last visit?  no   Family History  Problem Relation Age of Onset  . Hypertension Mother   . Diabetes Father   . Fibromyalgia Sister   . Hypertension Other     Cancer, Cerebrovascular disease run on mother side of family   History   Social History  . Marital Status: Divorced    Spouse Name: N/A    Number of Children: 3  . Years of Education: HS   Occupational History  . Unemployed     Disability   Social History Main Topics  . Smoking status: Never Smoker   . Smokeless tobacco: Never Used  . Alcohol Use: No  . Drug Use: No  . Sexual Activity: None   Other Topics Concern  . None   Social History Narrative   Past Surgical History  Procedure Laterality Date  . Laparoscopic ovarian cyst bx  2005    AND  URETEROSCOPIC LASER LITHO  STONE EXTRACTION  . Cysto/  urethral dilation/  hydrodistention/   instillation therapy  07-16-2010//   12-30-2007//   10-27-2006  . Transthoracic echocardiogram  06-07-2006    normal study/  ef 60-65%  . Exercise tolerence test  10-12-2010    NEGATIVE  ADEQUATE ETT/  NO ISCHEMIA OR EVIDENCE HIGH GRADE OBSTRUCTIVE CAD/  NO FURTHER TEST NEEDED  . Tubal ligation Bilateral 1995  . Shoulder open rotator cuff repair Right 2004  . Cholecystectomy  2014  . Hysteroscopy w/  novasure endometrial ablation  2014  . Cysto with hydrodistension N/A 01/26/2014    Procedure: CYSTOSCOPY/HYDRODISTENSION;  Surgeon: Bernestine Amass, MD;  Location: [facility];  Service: Urology;  Laterality: N/A;   Past Medical History  Diagnosis Date  . Fibromyalgia   . Depression   . Dyslipidemia   . Arthritis   . GERD (gastroesophageal reflux disease)   . Chronic low  back pain   . History of renal calculi   . RLS (restless legs syndrome)   . Interstitial cystitis   . IBS (irritable bowel syndrome)   . 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 PONV (postoperative nausea and vomiting)   . Anxiety disorder   . SI (sacroiliac) joint dysfunction   . SUI (stress urinary incontinence, female)   . Anxiety   . OSA on CPAP   . History of panic attacks   . Seasonal asthma   . Menorrhagia    BP 139/101 mmHg  Pulse 94  Resp 14  SpO2 96%  Opioid Risk Score:   Fall Risk Score: High Fall Risk (>13 points) (pt given pamphlet during todays visit) Review of Systems  Constitutional: Positive for appetite change.  Respiratory: Positive for apnea, cough and shortness of breath.   Gastrointestinal: Positive for nausea and abdominal pain.  Genitourinary:       Bladder control problems  Neurological: Positive for dizziness and weakness.  Psychiatric/Behavioral: Positive for confusion and dysphoric mood. The patient is nervous/anxious.   All other systems reviewed and are negative.      Objective:   Physical Exam  Constitutional: She is oriented to person, place, and time. She appears well-developed and well-nourished.  HENT:  Head: Normocephalic and atraumatic.  Neck: Normal range of motion. Neck supple.  Cervical Paraspinal tenderness: C-3- C-5   Cardiovascular: Normal rate and regular rhythm.   Pulmonary/Chest: Effort normal and breath sounds normal.  Musculoskeletal:  Normal Muscle Bulk and Muscle Testing Reveals: Upper Extremities: Full ROM and Muscle Strength 5/5 Left Spine of Scapula Tenderness Lumbar Paraspinal tenderness: L-3- L-5 Lower Extremities: Full ROM and Muscle strength 5/5 Arises from chair with ease Narrow based Gait  Neurological: She is alert and oriented to person, place, and time.  Skin: Skin is warm and dry.  Psychiatric: She has a normal mood and affect.  Nursing note and vitals reviewed.         Assessment  & Plan:  1. Lumbago:  Refilled: HYDROcodone 10/325mg  one tablet every 6 hours as needed #120  2. Fibromyalgia. Continue Current exercise Regime  3. Anxiety and depression: On Xanax: PCP Following:  obtaining a new Psychiatrist/ Psychologist 4. Migraines: On Maxalt. Neurology Following  5. OSA : Continue voltaren gel, exercise regime and losing weight.  6. Obesity: Following Healthy Diet Regimen with Dietician,   20 minutes of face to face patient care time was spent during this visit. All questions were encouraged and answered.   F/U in 1 Month

## 2014-09-08 DIAGNOSIS — L719 Rosacea, unspecified: Secondary | ICD-10-CM | POA: Diagnosis not present

## 2014-09-08 DIAGNOSIS — L270 Generalized skin eruption due to drugs and medicaments taken internally: Secondary | ICD-10-CM | POA: Diagnosis not present

## 2014-09-08 LAB — PMP ALCOHOL METABOLITE (ETG): Ethyl Glucuronide (EtG): NEGATIVE ng/mL

## 2014-09-09 DIAGNOSIS — F418 Other specified anxiety disorders: Secondary | ICD-10-CM | POA: Diagnosis not present

## 2014-09-09 DIAGNOSIS — E780 Pure hypercholesterolemia: Secondary | ICD-10-CM | POA: Diagnosis not present

## 2014-09-09 DIAGNOSIS — K5900 Constipation, unspecified: Secondary | ICD-10-CM | POA: Diagnosis not present

## 2014-09-12 DIAGNOSIS — R140 Abdominal distension (gaseous): Secondary | ICD-10-CM | POA: Insufficient documentation

## 2014-09-12 DIAGNOSIS — M542 Cervicalgia: Secondary | ICD-10-CM | POA: Diagnosis not present

## 2014-09-12 DIAGNOSIS — G44221 Chronic tension-type headache, intractable: Secondary | ICD-10-CM | POA: Diagnosis not present

## 2014-09-12 DIAGNOSIS — R202 Paresthesia of skin: Secondary | ICD-10-CM | POA: Diagnosis not present

## 2014-09-12 LAB — BENZODIAZEPINES (GC/LC/MS), URINE
Alprazolam metabolite (GC/LC/MS), ur confirm: 1208 ng/mL — AB (ref <25)
Clonazepam metabolite (GC/LC/MS), ur confirm: NEGATIVE ng/mL (ref <25)
Flurazepam metabolite (GC/LC/MS), ur confirm: NEGATIVE ng/mL (ref <50)
Lorazepam (GC/LC/MS), ur confirm: NEGATIVE ng/mL (ref <50)
Midazolam (GC/LC/MS), ur confirm: NEGATIVE ng/mL (ref <50)
Nordiazepam (GC/LC/MS), ur confirm: NEGATIVE ng/mL (ref <50)
Oxazepam (GC/LC/MS), ur confirm: NEGATIVE ng/mL (ref <50)
Temazepam (GC/LC/MS), ur confirm: NEGATIVE ng/mL (ref <50)
Triazolam metabolite (GC/LC/MS), ur confirm: NEGATIVE ng/mL (ref <50)

## 2014-09-12 LAB — OPIATES/OPIOIDS (LC/MS-MS)
Codeine Urine: NEGATIVE ng/mL (ref <50)
Hydrocodone: 2560 ng/mL (ref <50)
Hydromorphone: 564 ng/mL (ref <50)
Morphine Urine: NEGATIVE ng/mL (ref <50)
Norhydrocodone, Ur: 3085 ng/mL (ref <50)
Noroxycodone, Ur: NEGATIVE ng/mL (ref <50)
Oxycodone, ur: NEGATIVE ng/mL (ref <50)
Oxymorphone: NEGATIVE ng/mL (ref <50)

## 2014-09-13 LAB — PRESCRIPTION MONITORING PROFILE (SOLSTAS)
Amphetamine/Meth: NEGATIVE ng/mL
Barbiturate Screen, Urine: NEGATIVE ng/mL
Buprenorphine, Urine: NEGATIVE ng/mL
Cannabinoid Scrn, Ur: NEGATIVE ng/mL
Carisoprodol, Urine: NEGATIVE ng/mL
Cocaine Metabolites: NEGATIVE ng/mL
Creatinine, Urine: 386.57 mg/dL (ref >20.0)
Fentanyl, Ur: NEGATIVE ng/mL
MDMA URINE: NEGATIVE ng/mL
Meperidine, Ur: NEGATIVE ng/mL
Methadone Screen, Urine: NEGATIVE ng/mL
Nitrites, Initial: NEGATIVE ug/mL
Oxycodone Screen, Ur: NEGATIVE ng/mL
Propoxyphene: NEGATIVE ng/mL
Tapentadol, urine: NEGATIVE ng/mL
Tramadol Scrn, Ur: NEGATIVE ng/mL
Zolpidem, Urine: NEGATIVE ng/mL
pH, Initial: 5.1 pH (ref 4.5–8.9)

## 2014-09-21 DIAGNOSIS — J3089 Other allergic rhinitis: Secondary | ICD-10-CM | POA: Diagnosis not present

## 2014-09-21 DIAGNOSIS — J301 Allergic rhinitis due to pollen: Secondary | ICD-10-CM | POA: Diagnosis not present

## 2014-09-21 DIAGNOSIS — J3081 Allergic rhinitis due to animal (cat) (dog) hair and dander: Secondary | ICD-10-CM | POA: Diagnosis not present

## 2014-09-28 NOTE — Progress Notes (Signed)
Urine drug screen for this encounter is consistent for prescribed medications.   

## 2014-09-29 DIAGNOSIS — E559 Vitamin D deficiency, unspecified: Secondary | ICD-10-CM | POA: Diagnosis not present

## 2014-09-29 DIAGNOSIS — J019 Acute sinusitis, unspecified: Secondary | ICD-10-CM | POA: Diagnosis not present

## 2014-09-29 DIAGNOSIS — M940 Chondrocostal junction syndrome [Tietze]: Secondary | ICD-10-CM | POA: Diagnosis not present

## 2014-10-03 DIAGNOSIS — F418 Other specified anxiety disorders: Secondary | ICD-10-CM | POA: Diagnosis not present

## 2014-10-03 DIAGNOSIS — J019 Acute sinusitis, unspecified: Secondary | ICD-10-CM | POA: Diagnosis not present

## 2014-10-03 DIAGNOSIS — R0602 Shortness of breath: Secondary | ICD-10-CM | POA: Diagnosis not present

## 2014-10-03 DIAGNOSIS — G4733 Obstructive sleep apnea (adult) (pediatric): Secondary | ICD-10-CM | POA: Diagnosis not present

## 2014-10-03 DIAGNOSIS — J301 Allergic rhinitis due to pollen: Secondary | ICD-10-CM | POA: Diagnosis not present

## 2014-10-03 DIAGNOSIS — M797 Fibromyalgia: Secondary | ICD-10-CM | POA: Diagnosis not present

## 2014-10-03 DIAGNOSIS — J0110 Acute frontal sinusitis, unspecified: Secondary | ICD-10-CM | POA: Diagnosis not present

## 2014-10-05 ENCOUNTER — Encounter: Payer: Medicare Other | Admitting: Registered Nurse

## 2014-10-07 ENCOUNTER — Telehealth: Payer: Self-pay | Admitting: *Deleted

## 2014-10-07 NOTE — Telephone Encounter (Signed)
Notified Kalya

## 2014-10-07 NOTE — Telephone Encounter (Signed)
Its fine

## 2014-10-07 NOTE — Telephone Encounter (Signed)
Pt. Called feeling really ill, saw PCP, PCP said he would provide  An Rx for cough syrup if we would give her permission, patient  Asking for permission to get this script

## 2014-10-12 ENCOUNTER — Encounter: Payer: Medicare Other | Attending: Physical Medicine & Rehabilitation | Admitting: Registered Nurse

## 2014-10-12 ENCOUNTER — Encounter: Payer: Self-pay | Admitting: Registered Nurse

## 2014-10-12 VITALS — BP 164/99 | HR 99

## 2014-10-12 DIAGNOSIS — M1990 Unspecified osteoarthritis, unspecified site: Secondary | ICD-10-CM | POA: Diagnosis not present

## 2014-10-12 DIAGNOSIS — M25551 Pain in right hip: Secondary | ICD-10-CM

## 2014-10-12 DIAGNOSIS — M609 Myositis, unspecified: Secondary | ICD-10-CM

## 2014-10-12 DIAGNOSIS — N920 Excessive and frequent menstruation with regular cycle: Secondary | ICD-10-CM | POA: Insufficient documentation

## 2014-10-12 DIAGNOSIS — G8929 Other chronic pain: Secondary | ICD-10-CM | POA: Diagnosis not present

## 2014-10-12 DIAGNOSIS — E669 Obesity, unspecified: Secondary | ICD-10-CM | POA: Diagnosis not present

## 2014-10-12 DIAGNOSIS — N3010 Interstitial cystitis (chronic) without hematuria: Secondary | ICD-10-CM | POA: Insufficient documentation

## 2014-10-12 DIAGNOSIS — M797 Fibromyalgia: Secondary | ICD-10-CM | POA: Insufficient documentation

## 2014-10-12 DIAGNOSIS — Z760 Encounter for issue of repeat prescription: Secondary | ICD-10-CM | POA: Diagnosis not present

## 2014-10-12 DIAGNOSIS — M5441 Lumbago with sciatica, right side: Secondary | ICD-10-CM | POA: Diagnosis not present

## 2014-10-12 DIAGNOSIS — Z79899 Other long term (current) drug therapy: Secondary | ICD-10-CM | POA: Diagnosis not present

## 2014-10-12 DIAGNOSIS — G4733 Obstructive sleep apnea (adult) (pediatric): Secondary | ICD-10-CM | POA: Insufficient documentation

## 2014-10-12 DIAGNOSIS — K589 Irritable bowel syndrome without diarrhea: Secondary | ICD-10-CM | POA: Diagnosis not present

## 2014-10-12 DIAGNOSIS — K219 Gastro-esophageal reflux disease without esophagitis: Secondary | ICD-10-CM | POA: Insufficient documentation

## 2014-10-12 DIAGNOSIS — G43909 Migraine, unspecified, not intractable, without status migrainosus: Secondary | ICD-10-CM | POA: Diagnosis not present

## 2014-10-12 DIAGNOSIS — M791 Myalgia: Secondary | ICD-10-CM | POA: Diagnosis not present

## 2014-10-12 DIAGNOSIS — E785 Hyperlipidemia, unspecified: Secondary | ICD-10-CM | POA: Diagnosis not present

## 2014-10-12 DIAGNOSIS — F418 Other specified anxiety disorders: Secondary | ICD-10-CM | POA: Insufficient documentation

## 2014-10-12 DIAGNOSIS — IMO0001 Reserved for inherently not codable concepts without codable children: Secondary | ICD-10-CM

## 2014-10-12 DIAGNOSIS — Z5181 Encounter for therapeutic drug level monitoring: Secondary | ICD-10-CM

## 2014-10-12 DIAGNOSIS — M79671 Pain in right foot: Secondary | ICD-10-CM | POA: Insufficient documentation

## 2014-10-12 DIAGNOSIS — J45998 Other asthma: Secondary | ICD-10-CM | POA: Diagnosis not present

## 2014-10-12 DIAGNOSIS — M545 Low back pain, unspecified: Secondary | ICD-10-CM

## 2014-10-12 DIAGNOSIS — G2581 Restless legs syndrome: Secondary | ICD-10-CM | POA: Diagnosis not present

## 2014-10-12 DIAGNOSIS — N393 Stress incontinence (female) (male): Secondary | ICD-10-CM | POA: Diagnosis not present

## 2014-10-12 DIAGNOSIS — Z87442 Personal history of urinary calculi: Secondary | ICD-10-CM | POA: Diagnosis not present

## 2014-10-12 MED ORDER — HYDROCODONE-ACETAMINOPHEN 10-325 MG PO TABS: ORAL_TABLET | ORAL | Status: DC

## 2014-10-12 NOTE — Progress Notes (Signed)
Subjective:    Patient ID: Karina Greer, female    DOB: August 03, 1966, 49 y.o.   MRN: [id number]  HPI: Karina Greer is a 49 year old female who returns for follow up for chronic pain and medication refill. She says her pain is located in her lower back, right hip and right foot. She rates her pain 7. Her current exercise regime is performing stretching exercises, and walking. She admits to being depressed her PCP discontinued her celexa and started her on cymbalta. She had an allergic reaction to cymbalta and discontinued the medication. Still awaiting  counselor, [facility] given. She verbalizes understanding.   Pain Inventory Average Pain 6 Pain Right Now 7 My pain is constant, sharp, stabbing and aching  In the last 24 hours, has pain interfered with the following? General activity 6 Relation with others 7 Enjoyment of life 8 What TIME of day is your pain at its worst? ALL Sleep (in general) VARIES  Pain is worse with: walking, bending, sitting, standing and some activites Pain improves with: rest, heat/ice, therapy/exercise, pacing activities, medication and TENS Relief from Meds: 6  Mobility walk without assistance how many minutes can you walk? 10 ability to climb steps?  yes do you drive?  yes  Function disabled: date disabled .  Neuro/Psych bladder control problems weakness numbness dizziness depression anxiety  Prior Studies Any changes since last visit?  no  Physicians involved in your care Any changes since last visit?  no   Family History  Problem Relation Age of Onset  . Hypertension Mother   . Diabetes Father   . Fibromyalgia Sister   . Hypertension Other     Cancer, Cerebrovascular disease run on mother side of family   History   Social History  . Marital Status: Divorced    Spouse Name: N/A    Number of Children: 3  . Years of Education: HS   Occupational History  . Unemployed     Disability   Social History Main Topics    . Smoking status: Never Smoker   . Smokeless tobacco: Never Used  . Alcohol Use: No  . Drug Use: No  . Sexual Activity: None   Other Topics Concern  . None   Social History Narrative   Past Surgical History  Procedure Laterality Date  . Laparoscopic ovarian cyst bx  2005    AND  URETEROSCOPIC LASER LITHO  STONE EXTRACTION  . Cysto/  urethral dilation/  hydrodistention/   instillation therapy  07-16-2010//   12-30-2007//   10-27-2006  . Transthoracic echocardiogram  06-07-2006    normal study/  ef 60-65%  . Exercise tolerence test  10-12-2010    NEGATIVE  ADEQUATE ETT/  NO ISCHEMIA OR EVIDENCE HIGH GRADE OBSTRUCTIVE CAD/  NO FURTHER TEST NEEDED  . Tubal ligation Bilateral 1995  . Shoulder open rotator cuff repair Right 2004  . Cholecystectomy  2014  . Hysteroscopy w/  novasure endometrial ablation  2014  . Cysto with hydrodistension N/A 01/26/2014    Procedure: CYSTOSCOPY/HYDRODISTENSION;  Surgeon: Bernestine Amass, MD;  Location: [facility];  Service: Urology;  Laterality: N/A;   Past Medical History  Diagnosis Date  . Fibromyalgia   . Depression   . Dyslipidemia   . Arthritis   . GERD (gastroesophageal reflux disease)   . Chronic low back pain   . History of renal calculi   . RLS (restless legs syndrome)   . Interstitial cystitis   .  IBS (irritable bowel syndrome)   . 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 PONV (postoperative nausea and vomiting)   . Anxiety disorder   . SI (sacroiliac) joint dysfunction   . SUI (stress urinary incontinence, female)   . Anxiety   . OSA on CPAP   . History of panic attacks   . Seasonal asthma   . Menorrhagia    BP 164/99 mmHg  Pulse 99  SpO2 99%  Opioid Risk Score:   Fall Risk Score:    Review of Systems  Constitutional:       Poor appetite  HENT: Negative.   Eyes: Negative.   Respiratory: Positive for cough and shortness of breath.        Sleep apnea/CPAP  Cardiovascular: Negative.    Gastrointestinal: Positive for nausea, diarrhea and constipation.  Endocrine: Negative.   Genitourinary:       Bladder control problems  Musculoskeletal: Positive for myalgias, back pain, arthralgias, neck pain and neck stiffness.  Skin: Positive for rash.  Allergic/Immunologic: Negative.   Neurological: Positive for dizziness, weakness and numbness.  Hematological: Negative.   Psychiatric/Behavioral: Positive for dysphoric mood. The patient is nervous/anxious.        Objective:   Physical Exam  Constitutional: She is oriented to person, place, and time. She appears well-developed and well-nourished.  HENT:  Head: Normocephalic and atraumatic.  Neck: Normal range of motion. Neck supple.  Cardiovascular: Normal rate and regular rhythm.   Pulmonary/Chest: Effort normal and breath sounds normal.  Musculoskeletal:  Normal Muscle Bulk and Muscle Testing Reveals: Upper Extremities: Full ROM and Muscle strength 5/5 Lumbar Paraspinal Tenderness: L-3- L-5 Right Greater Trochanteric Tenderness Lower Extremities: Full ROM and Muscle Strength 5/5 Arises from chair with ease Narrow Based gait   Neurological: She is alert and oriented to person, place, and time.  Skin: Skin is warm and dry.  Psychiatric: She has a normal mood and affect.  Nursing note and vitals reviewed.         Assessment & Plan:  1. Lumbago:  Refilled: HYDROcodone 10/325mg  one tablet every 6 hours as needed #120  2. Fibromyalgia. Continue Current exercise Regime  3. Anxiety and depression: On Xanax: PCP Following: [facility] Pamphlet Given 4. Migraines: On Maxalt. Neurology Following  5. OSA : Continue voltaren gel, exercise regime and losing weight.  6. Obesity: Following Healthy Diet Regimen with Dietician,   20 minutes of face to face patient care time was spent during this visit. All questions were encouraged and answered.   F/U in 1 Month

## 2014-10-17 DIAGNOSIS — M549 Dorsalgia, unspecified: Secondary | ICD-10-CM | POA: Diagnosis not present

## 2014-10-17 DIAGNOSIS — M79604 Pain in right leg: Secondary | ICD-10-CM | POA: Diagnosis not present

## 2014-10-17 DIAGNOSIS — W009XXA Unspecified fall due to ice and snow, initial encounter: Secondary | ICD-10-CM | POA: Diagnosis not present

## 2014-10-17 DIAGNOSIS — R51 Headache: Secondary | ICD-10-CM | POA: Diagnosis not present

## 2014-10-24 DIAGNOSIS — R112 Nausea with vomiting, unspecified: Secondary | ICD-10-CM | POA: Diagnosis not present

## 2014-10-24 DIAGNOSIS — S060X0S Concussion without loss of consciousness, sequela: Secondary | ICD-10-CM | POA: Diagnosis not present

## 2014-10-24 DIAGNOSIS — H538 Other visual disturbances: Secondary | ICD-10-CM | POA: Diagnosis not present

## 2014-10-25 DIAGNOSIS — J301 Allergic rhinitis due to pollen: Secondary | ICD-10-CM | POA: Diagnosis not present

## 2014-10-25 DIAGNOSIS — R51 Headache: Secondary | ICD-10-CM | POA: Diagnosis not present

## 2014-10-25 DIAGNOSIS — S060X0S Concussion without loss of consciousness, sequela: Secondary | ICD-10-CM | POA: Diagnosis not present

## 2014-11-02 DIAGNOSIS — F411 Generalized anxiety disorder: Secondary | ICD-10-CM | POA: Diagnosis not present

## 2014-11-02 DIAGNOSIS — M66361 Spontaneous rupture of flexor tendons, right lower leg: Secondary | ICD-10-CM | POA: Diagnosis not present

## 2014-11-02 DIAGNOSIS — F331 Major depressive disorder, recurrent, moderate: Secondary | ICD-10-CM | POA: Diagnosis not present

## 2014-11-02 DIAGNOSIS — M25571 Pain in right ankle and joints of right foot: Secondary | ICD-10-CM | POA: Diagnosis not present

## 2014-11-04 DIAGNOSIS — F331 Major depressive disorder, recurrent, moderate: Secondary | ICD-10-CM | POA: Diagnosis not present

## 2014-11-04 DIAGNOSIS — F411 Generalized anxiety disorder: Secondary | ICD-10-CM | POA: Diagnosis not present

## 2014-11-09 ENCOUNTER — Encounter: Payer: Medicare Other | Attending: Physical Medicine & Rehabilitation | Admitting: Registered Nurse

## 2014-11-09 ENCOUNTER — Encounter: Payer: Self-pay | Admitting: Registered Nurse

## 2014-11-09 VITALS — BP 149/95 | HR 94 | Resp 14

## 2014-11-09 DIAGNOSIS — K219 Gastro-esophageal reflux disease without esophagitis: Secondary | ICD-10-CM | POA: Insufficient documentation

## 2014-11-09 DIAGNOSIS — M79671 Pain in right foot: Secondary | ICD-10-CM | POA: Insufficient documentation

## 2014-11-09 DIAGNOSIS — K589 Irritable bowel syndrome without diarrhea: Secondary | ICD-10-CM | POA: Insufficient documentation

## 2014-11-09 DIAGNOSIS — M5441 Lumbago with sciatica, right side: Secondary | ICD-10-CM

## 2014-11-09 DIAGNOSIS — M545 Low back pain, unspecified: Secondary | ICD-10-CM

## 2014-11-09 DIAGNOSIS — M797 Fibromyalgia: Secondary | ICD-10-CM | POA: Insufficient documentation

## 2014-11-09 DIAGNOSIS — F418 Other specified anxiety disorders: Secondary | ICD-10-CM | POA: Insufficient documentation

## 2014-11-09 DIAGNOSIS — Z5181 Encounter for therapeutic drug level monitoring: Secondary | ICD-10-CM | POA: Diagnosis not present

## 2014-11-09 DIAGNOSIS — E785 Hyperlipidemia, unspecified: Secondary | ICD-10-CM | POA: Diagnosis not present

## 2014-11-09 DIAGNOSIS — Z79899 Other long term (current) drug therapy: Secondary | ICD-10-CM

## 2014-11-09 DIAGNOSIS — G8929 Other chronic pain: Secondary | ICD-10-CM | POA: Diagnosis not present

## 2014-11-09 DIAGNOSIS — G2581 Restless legs syndrome: Secondary | ICD-10-CM | POA: Diagnosis not present

## 2014-11-09 DIAGNOSIS — G43909 Migraine, unspecified, not intractable, without status migrainosus: Secondary | ICD-10-CM | POA: Insufficient documentation

## 2014-11-09 DIAGNOSIS — M791 Myalgia: Secondary | ICD-10-CM

## 2014-11-09 DIAGNOSIS — J45998 Other asthma: Secondary | ICD-10-CM | POA: Diagnosis not present

## 2014-11-09 DIAGNOSIS — N393 Stress incontinence (female) (male): Secondary | ICD-10-CM | POA: Insufficient documentation

## 2014-11-09 DIAGNOSIS — G4733 Obstructive sleep apnea (adult) (pediatric): Secondary | ICD-10-CM | POA: Insufficient documentation

## 2014-11-09 DIAGNOSIS — Z87442 Personal history of urinary calculi: Secondary | ICD-10-CM | POA: Diagnosis not present

## 2014-11-09 DIAGNOSIS — N3010 Interstitial cystitis (chronic) without hematuria: Secondary | ICD-10-CM | POA: Insufficient documentation

## 2014-11-09 DIAGNOSIS — M1990 Unspecified osteoarthritis, unspecified site: Secondary | ICD-10-CM | POA: Insufficient documentation

## 2014-11-09 DIAGNOSIS — M25551 Pain in right hip: Secondary | ICD-10-CM

## 2014-11-09 DIAGNOSIS — N920 Excessive and frequent menstruation with regular cycle: Secondary | ICD-10-CM | POA: Insufficient documentation

## 2014-11-09 DIAGNOSIS — Z760 Encounter for issue of repeat prescription: Secondary | ICD-10-CM | POA: Insufficient documentation

## 2014-11-09 DIAGNOSIS — F411 Generalized anxiety disorder: Secondary | ICD-10-CM | POA: Diagnosis not present

## 2014-11-09 DIAGNOSIS — IMO0001 Reserved for inherently not codable concepts without codable children: Secondary | ICD-10-CM

## 2014-11-09 DIAGNOSIS — M609 Myositis, unspecified: Secondary | ICD-10-CM

## 2014-11-09 DIAGNOSIS — F331 Major depressive disorder, recurrent, moderate: Secondary | ICD-10-CM | POA: Diagnosis not present

## 2014-11-09 DIAGNOSIS — E669 Obesity, unspecified: Secondary | ICD-10-CM | POA: Insufficient documentation

## 2014-11-09 MED ORDER — HYDROCODONE-ACETAMINOPHEN 10-325 MG PO TABS: ORAL_TABLET | ORAL | Status: DC

## 2014-11-09 NOTE — Progress Notes (Signed)
Subjective:    Patient ID: Karina Greer, female    DOB: August 17, 1966, 49 y.o.   MRN: [id number]  HPI [name] is a 49 year old female who returns for follow up for chronic pain and medication refill. She says her pain is located in her lower back, right hip, right knee and right foot. She rates her pain 9. Her current exercise regime is performing stretching exercises, and walking. She says she was walking outside and loss her footing in January slid across the ice and landed on her back and she hit her head. She followed up with her PCP and had a CT Scan. Has a follow up appointment with her neurologist Dr. Melrose Nakayama 11/15/14. She has been receiving counseling at [facility] waiting on appointment with Psychiatrist for anti-depressant. She's very pleased with the counseling she is receiving. Her father has been diagnosed with Glioblastoma receiving radiation. Very tearful emotional support given. She will be having surgery on her right foot by Dr. Lorin Mercy awaiting a date.   Pain Inventory Average Pain 6 Pain Right Now 9 My pain is constant, sharp, burning and stabbing  In the last 24 hours, has pain interfered with the following? General activity 8 Relation with others 9 Enjoyment of life 9 What TIME of day is your pain at its worst? all Sleep (in general) Poor  Pain is worse with: walking, bending, sitting, inactivity, standing and some activites Pain improves with: rest, heat/ice, pacing activities, medication and TENS Relief from Meds: 5  Mobility walk without assistance how many minutes can you walk? 10 ability to climb steps?  yes do you drive?  yes Do you have any goals in this area?  yes  Function disabled: date disabled . I need assistance with the following:  meal prep, household duties and shopping Do you have any goals in this area?  yes  Neuro/Psych bladder control problems weakness tingling trouble  walking spasms dizziness confusion depression  Prior Studies Any changes since last visit?  yes bone scan x-rays CT/MRI nerve study  Physicians involved in your care Any changes since last visit?  no   Family History  Problem Relation Age of Onset  . Hypertension Mother   . Diabetes Father   . Fibromyalgia Sister   . Hypertension Other     Cancer, Cerebrovascular disease run on mother side of family   History   Social History  . Marital Status: Divorced    Spouse Name: N/A  . Number of Children: 3  . Years of Education: HS   Occupational History  . Unemployed     Disability   Social History Main Topics  . Smoking status: Never Smoker   . Smokeless tobacco: Never Used  . Alcohol Use: No  . Drug Use: No  . Sexual Activity: Not on file   Other Topics Concern  . None   Social History Narrative   Past Surgical History  Procedure Laterality Date  . Laparoscopic ovarian cyst bx  2005    AND  URETEROSCOPIC LASER LITHO  STONE EXTRACTION  . Cysto/  urethral dilation/  hydrodistention/   instillation therapy  07-16-2010//   12-30-2007//   10-27-2006  . Transthoracic echocardiogram  06-07-2006    normal study/  ef 60-65%  . Exercise tolerence test  10-12-2010    NEGATIVE  ADEQUATE ETT/  NO ISCHEMIA OR EVIDENCE HIGH GRADE OBSTRUCTIVE CAD/  NO FURTHER TEST NEEDED  . Tubal ligation Bilateral 1995  . Shoulder  open rotator cuff repair Right 2004  . Cholecystectomy  2014  . Hysteroscopy w/  novasure endometrial ablation  2014  . Cysto with hydrodistension N/A 01/26/2014    Procedure: CYSTOSCOPY/HYDRODISTENSION;  Surgeon: Bernestine Amass, MD;  Location: [facility];  Service: Urology;  Laterality: N/A;   Past Medical History  Diagnosis Date  . Fibromyalgia   . Depression   . Dyslipidemia   . Arthritis   . GERD (gastroesophageal reflux disease)   . Chronic low back pain   . History of renal calculi   . RLS (restless legs syndrome)   . Interstitial  cystitis   . IBS (irritable bowel syndrome)   . 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 PONV (postoperative nausea and vomiting)   . Anxiety disorder   . SI (sacroiliac) joint dysfunction   . SUI (stress urinary incontinence, female)   . Anxiety   . OSA on CPAP   . History of panic attacks   . Seasonal asthma   . Menorrhagia    BP 149/95 mmHg  Pulse 94  Resp 14  SpO2 96%  Opioid Risk Score:   Fall Risk Score: High Fall Risk (>13 points)  Review of Systems  Constitutional:       Poor appetite  Respiratory: Positive for apnea.   Gastrointestinal: Positive for nausea.  Musculoskeletal: Positive for gait problem.  Neurological: Positive for dizziness and weakness.       Tingling Spasms   Psychiatric/Behavioral: Positive for confusion and dysphoric mood. The patient is nervous/anxious.   All other systems reviewed and are negative.      Objective:   Physical Exam  Constitutional: She is oriented to person, place, and time. She appears well-developed and well-nourished.  HENT:  Head: Normocephalic and atraumatic.  Neck: Normal range of motion. Neck supple.  Cardiovascular: Normal rate and regular rhythm.   Pulmonary/Chest: Effort normal and breath sounds normal.  Musculoskeletal:  Normal Muscle Bulk and Muscle Testing Reveals: Upper Extremities: Full ROM and Muscle Strength 5/5 Lumbar Paraspinal Tenderness: L-3- L-5 Right Greater Trochanteric Tenderness Lower Extremities: Full ROM and Muscle Strength 5/5 Arises from chair with ease Narrow Based Gait  Neurological: She is alert and oriented to person, place, and time.  Skin: Skin is warm and dry.  Psychiatric: She has a normal mood and affect.  Nursing note and vitals reviewed.         Assessment & Plan:  1. Lumbago:  Refilled: HYDROcodone 10/325mg  one tablet every 6 hours as needed #120  2. Fibromyalgia. Continue Current exercise Regime  3. Anxiety and depression: On Xanax: PCP  Following:Continue Counseling at [facility] 4. Migraines: On Maxalt. Neurology Following  5. OSA : Continue voltaren gel, exercise regime and losing weight.  6. Obesity: Following Healthy Diet Regimen with Dietician,   20 minutes of face to face patient care time was spent during this visit. All questions were encouraged and answered.   F/U in 1 Month

## 2014-11-11 DIAGNOSIS — F411 Generalized anxiety disorder: Secondary | ICD-10-CM | POA: Diagnosis not present

## 2014-11-11 DIAGNOSIS — F331 Major depressive disorder, recurrent, moderate: Secondary | ICD-10-CM | POA: Diagnosis not present

## 2014-11-14 DIAGNOSIS — F411 Generalized anxiety disorder: Secondary | ICD-10-CM | POA: Diagnosis not present

## 2014-11-14 DIAGNOSIS — F331 Major depressive disorder, recurrent, moderate: Secondary | ICD-10-CM | POA: Diagnosis not present

## 2014-11-15 DIAGNOSIS — M542 Cervicalgia: Secondary | ICD-10-CM | POA: Diagnosis not present

## 2014-11-15 DIAGNOSIS — R109 Unspecified abdominal pain: Secondary | ICD-10-CM | POA: Diagnosis not present

## 2014-11-15 DIAGNOSIS — R42 Dizziness and giddiness: Secondary | ICD-10-CM | POA: Diagnosis not present

## 2014-11-15 DIAGNOSIS — G44221 Chronic tension-type headache, intractable: Secondary | ICD-10-CM | POA: Diagnosis not present

## 2014-11-15 DIAGNOSIS — J301 Allergic rhinitis due to pollen: Secondary | ICD-10-CM | POA: Diagnosis not present

## 2014-11-18 ENCOUNTER — Emergency Department: Payer: Self-pay | Admitting: Emergency Medicine

## 2014-11-18 DIAGNOSIS — I10 Essential (primary) hypertension: Secondary | ICD-10-CM | POA: Diagnosis not present

## 2014-11-18 DIAGNOSIS — R0789 Other chest pain: Secondary | ICD-10-CM | POA: Diagnosis not present

## 2014-11-18 DIAGNOSIS — Z79899 Other long term (current) drug therapy: Secondary | ICD-10-CM | POA: Diagnosis not present

## 2014-11-18 DIAGNOSIS — R079 Chest pain, unspecified: Secondary | ICD-10-CM | POA: Diagnosis not present

## 2014-11-18 DIAGNOSIS — F329 Major depressive disorder, single episode, unspecified: Secondary | ICD-10-CM | POA: Diagnosis not present

## 2014-11-18 DIAGNOSIS — F419 Anxiety disorder, unspecified: Secondary | ICD-10-CM | POA: Diagnosis not present

## 2014-11-18 DIAGNOSIS — R110 Nausea: Secondary | ICD-10-CM | POA: Diagnosis not present

## 2014-11-22 HISTORY — PX: ACHILLES TENDON SURGERY: SHX542

## 2014-11-23 DIAGNOSIS — J301 Allergic rhinitis due to pollen: Secondary | ICD-10-CM | POA: Diagnosis not present

## 2014-11-23 DIAGNOSIS — J3081 Allergic rhinitis due to animal (cat) (dog) hair and dander: Secondary | ICD-10-CM | POA: Diagnosis not present

## 2014-11-23 DIAGNOSIS — J3089 Other allergic rhinitis: Secondary | ICD-10-CM | POA: Diagnosis not present

## 2014-11-25 ENCOUNTER — Telehealth: Payer: Self-pay | Admitting: *Deleted

## 2014-11-25 DIAGNOSIS — F411 Generalized anxiety disorder: Secondary | ICD-10-CM | POA: Diagnosis not present

## 2014-11-25 DIAGNOSIS — F331 Major depressive disorder, recurrent, moderate: Secondary | ICD-10-CM | POA: Diagnosis not present

## 2014-11-25 NOTE — Telephone Encounter (Signed)
Pt is reporting that she is having foot surgery this Monday with Dr. Lorin Mercy and it may be that she is given a Rx for additional pain medications. Pt is calling in advance to make sure that this would be okay. Pt asking for call back

## 2014-11-25 NOTE — Telephone Encounter (Signed)
I talked to the patient and relayed this message to her from Dr. Naaman Plummer asking the patient to contact us with the medication type, dose and sig. Pt understood

## 2014-11-25 NOTE — Telephone Encounter (Signed)
i'm ok. Please let us know what the med/dose are

## 2014-11-28 DIAGNOSIS — S86011A Strain of right Achilles tendon, initial encounter: Secondary | ICD-10-CM | POA: Diagnosis not present

## 2014-11-28 DIAGNOSIS — X58XXXA Exposure to other specified factors, initial encounter: Secondary | ICD-10-CM | POA: Diagnosis not present

## 2014-11-28 DIAGNOSIS — M65271 Calcific tendinitis, right ankle and foot: Secondary | ICD-10-CM | POA: Diagnosis not present

## 2014-11-28 DIAGNOSIS — G8918 Other acute postprocedural pain: Secondary | ICD-10-CM | POA: Diagnosis not present

## 2014-11-28 DIAGNOSIS — M66361 Spontaneous rupture of flexor tendons, right lower leg: Secondary | ICD-10-CM | POA: Diagnosis not present

## 2014-11-28 DIAGNOSIS — Y929 Unspecified place or not applicable: Secondary | ICD-10-CM | POA: Diagnosis not present

## 2014-11-28 DIAGNOSIS — M25571 Pain in right ankle and joints of right foot: Secondary | ICD-10-CM | POA: Diagnosis not present

## 2014-11-28 DIAGNOSIS — M7731 Calcaneal spur, right foot: Secondary | ICD-10-CM | POA: Diagnosis not present

## 2014-12-02 ENCOUNTER — Telehealth: Payer: Self-pay | Admitting: *Deleted

## 2014-12-02 NOTE — Telephone Encounter (Signed)
Karina Greer called to report that she had her surgery Monday and was discharged Tuesday.  They gave her oxycodone 5/325 # 50 2 q 4-6 hr prn.  She has appt next week 12/07/14 with Naaman Plummer

## 2014-12-07 ENCOUNTER — Encounter: Payer: Self-pay | Admitting: Registered Nurse

## 2014-12-07 ENCOUNTER — Ambulatory Visit: Payer: Medicare Other | Admitting: Physical Medicine & Rehabilitation

## 2014-12-07 ENCOUNTER — Telehealth: Payer: Self-pay | Admitting: *Deleted

## 2014-12-07 ENCOUNTER — Encounter: Payer: Medicare Other | Attending: Physical Medicine & Rehabilitation | Admitting: Registered Nurse

## 2014-12-07 VITALS — BP 141/92 | HR 83 | Resp 14

## 2014-12-07 DIAGNOSIS — F418 Other specified anxiety disorders: Secondary | ICD-10-CM | POA: Insufficient documentation

## 2014-12-07 DIAGNOSIS — N393 Stress incontinence (female) (male): Secondary | ICD-10-CM | POA: Diagnosis not present

## 2014-12-07 DIAGNOSIS — M25551 Pain in right hip: Secondary | ICD-10-CM | POA: Diagnosis not present

## 2014-12-07 DIAGNOSIS — N920 Excessive and frequent menstruation with regular cycle: Secondary | ICD-10-CM | POA: Insufficient documentation

## 2014-12-07 DIAGNOSIS — Z87442 Personal history of urinary calculi: Secondary | ICD-10-CM | POA: Diagnosis not present

## 2014-12-07 DIAGNOSIS — E669 Obesity, unspecified: Secondary | ICD-10-CM | POA: Insufficient documentation

## 2014-12-07 DIAGNOSIS — G8929 Other chronic pain: Secondary | ICD-10-CM | POA: Insufficient documentation

## 2014-12-07 DIAGNOSIS — Z79899 Other long term (current) drug therapy: Secondary | ICD-10-CM | POA: Diagnosis not present

## 2014-12-07 DIAGNOSIS — N3010 Interstitial cystitis (chronic) without hematuria: Secondary | ICD-10-CM | POA: Diagnosis not present

## 2014-12-07 DIAGNOSIS — E785 Hyperlipidemia, unspecified: Secondary | ICD-10-CM | POA: Diagnosis not present

## 2014-12-07 DIAGNOSIS — M66361 Spontaneous rupture of flexor tendons, right lower leg: Secondary | ICD-10-CM | POA: Diagnosis not present

## 2014-12-07 DIAGNOSIS — Z9889 Other specified postprocedural states: Secondary | ICD-10-CM

## 2014-12-07 DIAGNOSIS — K589 Irritable bowel syndrome without diarrhea: Secondary | ICD-10-CM | POA: Diagnosis not present

## 2014-12-07 DIAGNOSIS — M1990 Unspecified osteoarthritis, unspecified site: Secondary | ICD-10-CM | POA: Diagnosis not present

## 2014-12-07 DIAGNOSIS — Z760 Encounter for issue of repeat prescription: Secondary | ICD-10-CM | POA: Insufficient documentation

## 2014-12-07 DIAGNOSIS — K219 Gastro-esophageal reflux disease without esophagitis: Secondary | ICD-10-CM | POA: Diagnosis not present

## 2014-12-07 DIAGNOSIS — M791 Myalgia: Secondary | ICD-10-CM

## 2014-12-07 DIAGNOSIS — G4733 Obstructive sleep apnea (adult) (pediatric): Secondary | ICD-10-CM | POA: Diagnosis not present

## 2014-12-07 DIAGNOSIS — M797 Fibromyalgia: Secondary | ICD-10-CM | POA: Diagnosis not present

## 2014-12-07 DIAGNOSIS — M609 Myositis, unspecified: Secondary | ICD-10-CM | POA: Diagnosis not present

## 2014-12-07 DIAGNOSIS — G43909 Migraine, unspecified, not intractable, without status migrainosus: Secondary | ICD-10-CM | POA: Insufficient documentation

## 2014-12-07 DIAGNOSIS — IMO0001 Reserved for inherently not codable concepts without codable children: Secondary | ICD-10-CM

## 2014-12-07 DIAGNOSIS — J45998 Other asthma: Secondary | ICD-10-CM | POA: Diagnosis not present

## 2014-12-07 DIAGNOSIS — G2581 Restless legs syndrome: Secondary | ICD-10-CM | POA: Diagnosis not present

## 2014-12-07 DIAGNOSIS — Z5181 Encounter for therapeutic drug level monitoring: Secondary | ICD-10-CM

## 2014-12-07 DIAGNOSIS — M5441 Lumbago with sciatica, right side: Secondary | ICD-10-CM

## 2014-12-07 DIAGNOSIS — M79671 Pain in right foot: Secondary | ICD-10-CM | POA: Diagnosis not present

## 2014-12-07 DIAGNOSIS — M545 Low back pain: Secondary | ICD-10-CM | POA: Diagnosis not present

## 2014-12-07 MED ORDER — HYDROCODONE-ACETAMINOPHEN 10-325 MG PO TABS: ORAL_TABLET | ORAL | Status: DC

## 2014-12-07 NOTE — Progress Notes (Signed)
Subjective:    Patient ID: Karina Greer, female    DOB: 11-Feb-1966, 49 y.o.   MRN: [id number]  HPI:  [name] is a 49 year old female who returns for follow up for chronic pain and medication refill. She says her pain is located in her lower back and  Right lower extremity (S/P right foot surgery 11/28/14 by Dr. Lorin Greer If he wants to change her analgesics due to manipulation of foot today she has been instructed to call office she verbalizes understanding). She has a follow-up appointment with Dr. Lorin Greer today at 10:15.  She rates her pain 9. She's unable to perform current exercise regime due to non-weight bearing on her right foot. She's using a knee scooter at this time. On her PHQ-9 she scored a 19, she's receiving counseling at [facility] and seeing Dr. Silvio Greer the Psychiatrist. She's very pleased with the counseling she is receiving. Her father has been diagnosed with Glioblastoma receiving chemotherapy at this time. Emotional support given.   Pain Inventory Average Pain 8 Pain Right Now 9 My pain is constant, sharp, burning, stabbing, tingling and aching  In the last 24 hours, has pain interfered with the following? General activity 10 Relation with others 10 Enjoyment of life 10 What TIME of day is your pain at its worst? all Sleep (in general) Fair  Pain is worse with: walking, bending, sitting, inactivity, standing and some activites Pain improves with: rest, heat/ice, therapy/exercise, pacing activities, medication and TENS Relief from Meds: 5  Mobility walk with assistance use a walker how many minutes can you walk? 0 ability to climb steps?  no do you drive?  no Do you have any goals in this area?  yes  Function disabled: date disabled . I need assistance with the following:  meal prep, household duties and shopping Do you have any goals in this area?  yes  Neuro/Psych numbness trouble walking dizziness depression anxiety  Prior Studies Any  changes since last visit?  no bone scan x-rays CT/MRI nerve study  Physicians involved in your care Any changes since last visit?  no   Family History  Problem Relation Age of Onset  . Hypertension Mother   . Diabetes Father   . Fibromyalgia Sister   . Hypertension Other     Cancer, Cerebrovascular disease run on mother side of family   History   Social History  . Marital Status: Divorced    Spouse Name: N/A  . Number of Children: 3  . Years of Education: HS   Occupational History  . Unemployed     Disability   Social History Main Topics  . Smoking status: Never Smoker   . Smokeless tobacco: Never Used  . Alcohol Use: No  . Drug Use: No  . Sexual Activity: Not on file   Other Topics Concern  . None   Social History Narrative   Past Surgical History  Procedure Laterality Date  . Laparoscopic ovarian cyst bx  2005    AND  URETEROSCOPIC LASER LITHO  STONE EXTRACTION  . Cysto/  urethral dilation/  hydrodistention/   instillation therapy  07-16-2010//   12-30-2007//   10-27-2006  . Transthoracic echocardiogram  06-07-2006    normal study/  ef 60-65%  . Exercise tolerence test  10-12-2010    NEGATIVE  ADEQUATE ETT/  NO ISCHEMIA OR EVIDENCE HIGH GRADE OBSTRUCTIVE CAD/  NO FURTHER TEST NEEDED  . Tubal ligation Bilateral 1995  . Shoulder open rotator  cuff repair Right 2004  . Cholecystectomy  2014  . Hysteroscopy w/  novasure endometrial ablation  2014  . Cysto with hydrodistension N/A 01/26/2014    Procedure: CYSTOSCOPY/HYDRODISTENSION;  Surgeon: Karina Amass, MD;  Location: [facility];  Service: Urology;  Laterality: N/A;   Past Medical History  Diagnosis Date  . Fibromyalgia   . Depression   . Dyslipidemia   . Arthritis   . GERD (gastroesophageal reflux disease)   . Chronic low back pain   . History of renal calculi   . RLS (restless legs syndrome)   . Interstitial cystitis   . IBS (irritable bowel syndrome)   . White matter abnormality  on MRI of brain 02/23/2013  . PONV (postoperative nausea and vomiting)   . Anxiety disorder   . SI (sacroiliac) joint dysfunction   . SUI (stress urinary incontinence, female)   . Anxiety   . OSA on CPAP   . History of panic attacks   . Seasonal asthma   . Menorrhagia    BP 141/92 mmHg  Pulse 83  Resp 14  SpO2 97%  Opioid Risk Score:   Fall Risk Score: High Fall Risk (>13 points)  Review of Systems  Constitutional:       Poor appetite  Respiratory: Positive for apnea and shortness of breath.        Respiratory infection  Cardiovascular: Positive for leg swelling.  Gastrointestinal: Positive for nausea and constipation.  Musculoskeletal: Positive for joint swelling and gait problem.  Neurological: Positive for dizziness and numbness.  Psychiatric/Behavioral: Positive for dysphoric mood. The patient is nervous/anxious.   All other systems reviewed and are negative.      Objective:   Physical Exam  Constitutional: She is oriented to person, place, and time. She appears well-developed and well-nourished.  HENT:  Head: Normocephalic and atraumatic.  Neck: Normal range of motion. Neck supple.  Cardiovascular: Normal rate and regular rhythm.   Pulmonary/Chest: Effort normal and breath sounds normal.  Musculoskeletal:  Normal Muscle Bulk and Muscle Testing Reveals: Upper Extremities: Full ROM and Muscle Strength 5/5 Lumbar Paraspinal Tenderness: L-4- L-5 Lower Extremities: Left Full ROM and Muscle Strength 5/5 Right Lower Extremity: Cast/ ace-wrap intact [place] Mobile Arises from chair using knee scooter for support    Neurological: She is alert and oriented to person, place, and time.  Skin: Skin is warm and dry.  Psychiatric: She has a normal mood and affect.  Nursing note and vitals reviewed.         Assessment & Plan:  1. Lumbago:  Refilled: HYDROcodone 10/325mg  one tablet every 6 hours as needed #120  2. Fibromyalgia. Continue Current exercise Regime   3. Anxiety and depression: On Xanax: PCP Following:Continue Counseling at [facility] 4. Migraines: On Maxalt. Neurology Following  5. OSA : Continue voltaren gel, exercise regime and losing weight.  6. Obesity: Following Healthy Diet Regimen with Dietician,  7. Status Post Right Foot Surgery: Dr. Lorin Greer Following 20 minutes of face to face patient care time was spent during this visit. All questions were encouraged and answered.   F/U in 1 Month

## 2014-12-07 NOTE — Telephone Encounter (Signed)
Saw you this morning, called back as requested following her orthopedic appt. Orthopedic doctor gave her another script for the percocet. She also has the information regarding her surgery.  Asking you to please call her back

## 2014-12-07 NOTE — Telephone Encounter (Signed)
Spoke to [name] ,  Dr. Lorin Mercy prescribed her Percocets today. Instructed to follow Dr. Lorin Mercy instructions. She was instructed not to take the hydrocodone at this time. She verbalizes understanding.

## 2014-12-08 DIAGNOSIS — J301 Allergic rhinitis due to pollen: Secondary | ICD-10-CM | POA: Diagnosis not present

## 2014-12-13 DIAGNOSIS — M66361 Spontaneous rupture of flexor tendons, right lower leg: Secondary | ICD-10-CM | POA: Diagnosis not present

## 2014-12-13 DIAGNOSIS — M25571 Pain in right ankle and joints of right foot: Secondary | ICD-10-CM | POA: Diagnosis not present

## 2014-12-27 DIAGNOSIS — F331 Major depressive disorder, recurrent, moderate: Secondary | ICD-10-CM | POA: Diagnosis not present

## 2014-12-27 DIAGNOSIS — F411 Generalized anxiety disorder: Secondary | ICD-10-CM | POA: Diagnosis not present

## 2015-01-04 ENCOUNTER — Encounter: Payer: Medicare Other | Admitting: Physical Medicine & Rehabilitation

## 2015-01-05 DIAGNOSIS — G4733 Obstructive sleep apnea (adult) (pediatric): Secondary | ICD-10-CM | POA: Diagnosis not present

## 2015-01-05 DIAGNOSIS — J301 Allergic rhinitis due to pollen: Secondary | ICD-10-CM | POA: Diagnosis not present

## 2015-01-05 DIAGNOSIS — R079 Chest pain, unspecified: Secondary | ICD-10-CM | POA: Diagnosis not present

## 2015-01-05 DIAGNOSIS — R0602 Shortness of breath: Secondary | ICD-10-CM | POA: Diagnosis not present

## 2015-01-09 ENCOUNTER — Ambulatory Visit
Admit: 2015-01-09 | Disposition: A | Discharge status: Home / Self Care | Payer: Self-pay | Attending: Internal Medicine | Admitting: Internal Medicine

## 2015-01-09 DIAGNOSIS — R0602 Shortness of breath: Secondary | ICD-10-CM | POA: Diagnosis not present

## 2015-01-09 DIAGNOSIS — R0789 Other chest pain: Secondary | ICD-10-CM | POA: Diagnosis not present

## 2015-01-09 DIAGNOSIS — R079 Chest pain, unspecified: Secondary | ICD-10-CM | POA: Diagnosis not present

## 2015-01-10 ENCOUNTER — Ambulatory Visit
Admission: RE | Admit: 2015-01-10 | Discharge: 2015-01-10 | Disposition: A | Discharge status: Home / Self Care | Payer: Medicare Other | Source: Ambulatory Visit | Attending: Orthopaedic Surgery | Admitting: Orthopaedic Surgery

## 2015-01-10 ENCOUNTER — Other Ambulatory Visit: Payer: Self-pay | Admitting: Orthopaedic Surgery

## 2015-01-10 DIAGNOSIS — M79604 Pain in right leg: Secondary | ICD-10-CM | POA: Diagnosis not present

## 2015-01-10 DIAGNOSIS — R609 Edema, unspecified: Secondary | ICD-10-CM

## 2015-01-10 DIAGNOSIS — M7989 Other specified soft tissue disorders: Secondary | ICD-10-CM | POA: Diagnosis not present

## 2015-01-10 NOTE — Op Note (Signed)
PATIENT NAME:  Karina Greer, Karina Greer MR#:  [id number] DATE OF BIRTH:  20-Feb-1966  DATE OF PROCEDURE:  07/13/2012  PREOPERATIVE DIAGNOSIS: Irregular heavy bleeding.   POSTOPERATIVE DIAGNOSIS: Irregular heavy bleeding.   PROCEDURES:  1. Hysteroscopy.  2. Endometrial sampling. 3. NovaSure ablation.   SURGEON: Ricky L. Amalia Hailey, MD  ANESTHESIA: General endotracheal.   FINDINGS: Small fleshy-appearing polyp towards the left anterior cornual. Minimal to moderate amount of curettings otherwise.   DRAINS: In and out catheter at the end of the case  of clear urine.   PROCEDURE IN DETAIL: Patient consented. Preoperative antibiotics given. Taken to the Operating Room, placed in supine position. Anesthesia was initiated. Placed in dorsal lithotomy position, prepped and draped in usual sterile fashion. Cardiovascular was visualized, grasped with single-tooth tenaculum. Measurements were taken which showed the endometrial cavity to be 4 cm.   Hysteroscopic evaluation did show polyp toward the left cornu which was sampled with polyp forceps. I attempted to place a NovaSure device, unable to get the arms to array laterally. This was overcome by some sharp curettage of the upper uterus. Then inserted the NovaSure device and was able to get a cavity width of 2.5 and then cycle was carried out after passing cavity test on first attempt, for the full two minutes.   Instruments removed, cervix was seen to be hemostatic. Patient was returned to the supine position. All instrument, needle, and sponge counts were correct. Tissue sent for permanent pathology.   I anticipate a routine postoperative course.   ____________________________ Rockey Situ. Amalia Hailey, MD rle:cms D: 07/13/2012 08:16:59 ET T: 07/13/2012 09:40:26 ET JOB#: [id number]  cc: Ricky L. Amalia Hailey, MD, <Dictator> Selmer Dominion MD ELECTRONICALLY SIGNED 07/14/2012 8:36

## 2015-01-11 ENCOUNTER — Encounter: Payer: Medicare Other | Attending: Physical Medicine & Rehabilitation | Admitting: Registered Nurse

## 2015-01-11 ENCOUNTER — Other Ambulatory Visit: Payer: Self-pay | Admitting: Registered Nurse

## 2015-01-11 ENCOUNTER — Encounter: Payer: Self-pay | Admitting: Registered Nurse

## 2015-01-11 VITALS — BP 141/98 | HR 80 | Resp 14

## 2015-01-11 DIAGNOSIS — E669 Obesity, unspecified: Secondary | ICD-10-CM | POA: Diagnosis not present

## 2015-01-11 DIAGNOSIS — M609 Myositis, unspecified: Secondary | ICD-10-CM

## 2015-01-11 DIAGNOSIS — N920 Excessive and frequent menstruation with regular cycle: Secondary | ICD-10-CM | POA: Diagnosis not present

## 2015-01-11 DIAGNOSIS — N3010 Interstitial cystitis (chronic) without hematuria: Secondary | ICD-10-CM | POA: Diagnosis not present

## 2015-01-11 DIAGNOSIS — M1990 Unspecified osteoarthritis, unspecified site: Secondary | ICD-10-CM | POA: Diagnosis not present

## 2015-01-11 DIAGNOSIS — M797 Fibromyalgia: Secondary | ICD-10-CM | POA: Diagnosis not present

## 2015-01-11 DIAGNOSIS — G43909 Migraine, unspecified, not intractable, without status migrainosus: Secondary | ICD-10-CM | POA: Insufficient documentation

## 2015-01-11 DIAGNOSIS — F32A Depression, unspecified: Secondary | ICD-10-CM

## 2015-01-11 DIAGNOSIS — G894 Chronic pain syndrome: Secondary | ICD-10-CM

## 2015-01-11 DIAGNOSIS — Z79899 Other long term (current) drug therapy: Secondary | ICD-10-CM | POA: Diagnosis not present

## 2015-01-11 DIAGNOSIS — K589 Irritable bowel syndrome without diarrhea: Secondary | ICD-10-CM | POA: Insufficient documentation

## 2015-01-11 DIAGNOSIS — J45998 Other asthma: Secondary | ICD-10-CM | POA: Diagnosis not present

## 2015-01-11 DIAGNOSIS — F418 Other specified anxiety disorders: Secondary | ICD-10-CM | POA: Diagnosis not present

## 2015-01-11 DIAGNOSIS — M25551 Pain in right hip: Secondary | ICD-10-CM | POA: Insufficient documentation

## 2015-01-11 DIAGNOSIS — F329 Major depressive disorder, single episode, unspecified: Secondary | ICD-10-CM

## 2015-01-11 DIAGNOSIS — E785 Hyperlipidemia, unspecified: Secondary | ICD-10-CM | POA: Diagnosis not present

## 2015-01-11 DIAGNOSIS — K219 Gastro-esophageal reflux disease without esophagitis: Secondary | ICD-10-CM | POA: Diagnosis not present

## 2015-01-11 DIAGNOSIS — Z760 Encounter for issue of repeat prescription: Secondary | ICD-10-CM | POA: Insufficient documentation

## 2015-01-11 DIAGNOSIS — G2581 Restless legs syndrome: Secondary | ICD-10-CM | POA: Insufficient documentation

## 2015-01-11 DIAGNOSIS — G4733 Obstructive sleep apnea (adult) (pediatric): Secondary | ICD-10-CM | POA: Insufficient documentation

## 2015-01-11 DIAGNOSIS — N393 Stress incontinence (female) (male): Secondary | ICD-10-CM | POA: Insufficient documentation

## 2015-01-11 DIAGNOSIS — M79671 Pain in right foot: Secondary | ICD-10-CM | POA: Diagnosis not present

## 2015-01-11 DIAGNOSIS — Z87442 Personal history of urinary calculi: Secondary | ICD-10-CM | POA: Insufficient documentation

## 2015-01-11 DIAGNOSIS — Z5181 Encounter for therapeutic drug level monitoring: Secondary | ICD-10-CM | POA: Diagnosis not present

## 2015-01-11 DIAGNOSIS — G8929 Other chronic pain: Secondary | ICD-10-CM | POA: Insufficient documentation

## 2015-01-11 DIAGNOSIS — M791 Myalgia: Secondary | ICD-10-CM

## 2015-01-11 DIAGNOSIS — M5441 Lumbago with sciatica, right side: Secondary | ICD-10-CM

## 2015-01-11 DIAGNOSIS — M545 Low back pain: Secondary | ICD-10-CM | POA: Diagnosis not present

## 2015-01-11 DIAGNOSIS — IMO0001 Reserved for inherently not codable concepts without codable children: Secondary | ICD-10-CM

## 2015-01-11 MED ORDER — HYDROCODONE-ACETAMINOPHEN 10-325 MG PO TABS: ORAL_TABLET | ORAL | Status: DC

## 2015-01-11 NOTE — Progress Notes (Signed)
Subjective:    Patient ID: Karina Greer, female    DOB: 01/06/1966, 49 y.o.   MRN: [id number]  HPI: [name] is a 49 year old female who returns for follow up for chronic pain and medication refill. She's complaining of generalized pain all over. She's not following an exercise regime at this time. She had her right lower extremity cast removed yesterday 01/10/15 at Dr. Lorin Mercy office, she was instructed to wear the cam boot 24 hours a day. Also was instructed to start light weight bearing she's walking with a walker. She rates her pain 10.  Pain Inventory Average Pain 6 Pain Right Now 10 My pain is constant, sharp and burning  In the last 24 hours, has pain interfered with the following? General activity 10 Relation with others 10 Enjoyment of life 10 What TIME of day is your pain at its worst? all Sleep (in general) Fair  Pain is worse with: walking, bending, sitting, inactivity, standing and some activites Pain improves with: rest, heat/ice, therapy/exercise, pacing activities, medication and TENS Relief from Meds: 5  Mobility walk with assistance use a walker ability to climb steps?  no do you drive?  no Do you have any goals in this area?  yes  Function disabled: date disabled . I need assistance with the following:  meal prep, household duties and shopping Do you have any goals in this area?  yes  Neuro/Psych bladder control problems weakness numbness trouble walking dizziness confusion depression anxiety  Prior Studies Any changes since last visit?  yes  Physicians involved in your care Any changes since last visit?  yes   Family History  Problem Relation Age of Onset  . Hypertension Mother   . Diabetes Father   . Fibromyalgia Sister   . Hypertension Other     Cancer, Cerebrovascular disease run on mother side of family   History   Social History  . Marital Status: Divorced    Spouse Name: N/A  . Number of Children: 3  . Years of  Education: HS   Occupational History  . Unemployed     Disability   Social History Main Topics  . Smoking status: Never Smoker   . Smokeless tobacco: Never Used  . Alcohol Use: No  . Drug Use: No  . Sexual Activity: Not on file   Other Topics Concern  . None   Social History Narrative   Past Surgical History  Procedure Laterality Date  . Laparoscopic ovarian cyst bx  2005    AND  URETEROSCOPIC LASER LITHO  STONE EXTRACTION  . Cysto/  urethral dilation/  hydrodistention/   instillation therapy  07-16-2010//   12-30-2007//   10-27-2006  . Transthoracic echocardiogram  06-07-2006    normal study/  ef 60-65%  . Exercise tolerence test  10-12-2010    NEGATIVE  ADEQUATE ETT/  NO ISCHEMIA OR EVIDENCE HIGH GRADE OBSTRUCTIVE CAD/  NO FURTHER TEST NEEDED  . Tubal ligation Bilateral 1995  . Shoulder open rotator cuff repair Right 2004  . Cholecystectomy  2014  . Hysteroscopy w/  novasure endometrial ablation  2014  . Cysto with hydrodistension N/A 01/26/2014    Procedure: CYSTOSCOPY/HYDRODISTENSION;  Surgeon: Bernestine Amass, MD;  Location: [facility];  Service: Urology;  Laterality: N/A;   Past Medical History  Diagnosis Date  . Fibromyalgia   . Depression   . Dyslipidemia   . Arthritis   . GERD (gastroesophageal reflux disease)   . Chronic  low back pain   . History of renal calculi   . RLS (restless legs syndrome)   . Interstitial cystitis   . IBS (irritable bowel syndrome)   . 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 PONV (postoperative nausea and vomiting)   . Anxiety disorder   . SI (sacroiliac) joint dysfunction   . SUI (stress urinary incontinence, female)   . Anxiety   . OSA on CPAP   . History of panic attacks   . Seasonal asthma   . Menorrhagia    BP 141/98 mmHg  Pulse 80  Resp 14  SpO2 97%  Opioid Risk Score:   Fall Risk Score: High Fall Risk (>13 points)`1  Depression screen PHQ 2/9  Depression screen PHQ 2/9 12/07/2014    Decreased Interest 3  Down, Depressed, Hopeless 2  PHQ - 2 Score 5  Altered sleeping 3  Tired, decreased energy 3  Change in appetite 3  Feeling bad or failure about yourself  1  Trouble concentrating 3  Moving slowly or fidgety/restless 1  Suicidal thoughts 0  PHQ-9 Score 19     Review of Systems  Constitutional:       Poor appetite  Respiratory: Positive for apnea, cough and shortness of breath.   Cardiovascular: Positive for leg swelling.  Gastrointestinal: Positive for nausea.  Genitourinary: Positive for urgency and frequency.  Musculoskeletal: Positive for gait problem.  Neurological: Positive for dizziness, weakness and numbness.  Psychiatric/Behavioral: Positive for confusion and dysphoric mood. The patient is nervous/anxious.   All other systems reviewed and are negative.      Objective:   Physical Exam  Constitutional: She is oriented to person, place, and time. She appears well-developed and well-nourished.  HENT:  Head: Normocephalic and atraumatic.  Neck: Normal range of motion. Neck supple.  Cardiovascular: Normal rate and regular rhythm.   Pulmonary/Chest: Effort normal and breath sounds normal.  Musculoskeletal:  Normal Muscle Bulk and Muscle Testing Reveals: Upper Extremities: Full ROM and Muscle Strength 5/5 Thoracic Tightness T-3- T-12 Lumbar Paraspinal Tenderness: L-3- L-5 Lower Extremities: Left: Full ROM and Muscle Strength 5/5 Right: Decreased ROM and Muscle Strength 3/5 Wearing Cam Boot Arises from chair slowly using walker Antalgic Gait  Neurological: She is alert and oriented to person, place, and time.  Skin: Skin is warm and dry.  Psychiatric: She has a normal mood and affect.  Nursing note and vitals reviewed.         Assessment & Plan:  1. Lumbago:  Refilled: HYDROcodone 10/325mg  one tablet every 6 hours as needed #120  2. Fibromyalgia. Continue Current exercise Regime  3. Anxiety and depression: Dr. Jordan Hawks Psychiatrist  Following was prescribed Klonopin.  Continue Counseling at [facility] 4. Migraines: On Maxalt. Neurology Following  5. OSA : Continue exercise regime as tolerated and losing weight.  6. Obesity: Following Healthy Diet Regimen 7. Status Post Right Foot Surgery: Dr. Lorin Mercy Following  20 minutes of face to face patient care time was spent during this visit. All questions were encouraged and answered.   F/U in 1 Month

## 2015-01-12 DIAGNOSIS — G4733 Obstructive sleep apnea (adult) (pediatric): Secondary | ICD-10-CM | POA: Diagnosis not present

## 2015-01-12 DIAGNOSIS — R0602 Shortness of breath: Secondary | ICD-10-CM | POA: Diagnosis not present

## 2015-01-12 DIAGNOSIS — J301 Allergic rhinitis due to pollen: Secondary | ICD-10-CM | POA: Diagnosis not present

## 2015-01-12 DIAGNOSIS — F410 Panic disorder [episodic paroxysmal anxiety] without agoraphobia: Secondary | ICD-10-CM | POA: Diagnosis not present

## 2015-01-12 DIAGNOSIS — J3089 Other allergic rhinitis: Secondary | ICD-10-CM | POA: Diagnosis not present

## 2015-01-12 DIAGNOSIS — R079 Chest pain, unspecified: Secondary | ICD-10-CM | POA: Diagnosis not present

## 2015-01-12 LAB — PMP ALCOHOL METABOLITE (ETG): Ethyl Glucuronide (EtG): NEGATIVE ng/mL

## 2015-01-13 NOTE — Op Note (Signed)
PATIENT NAME:  Karina, Greer MR#:  [id number] DATE OF BIRTH:  05-19-1966  DATE OF PROCEDURE:  11/02/2012  PREOPERATIVE DIAGNOSIS: Chronic cholecystitis, cholelithiasis.   POSTOPERATIVE DIAGNOSIS:  Chronic cholecystitis, cholelithiasis.  PROCEDURE:  1.  Laparoscopic cholecystectomy. 2.  Cholangiogram.   SURGEON: Rochel Brome, M.D.   ANESTHESIA: General.   INDICATIONS: This 49 year old female has a history of chronic intermittent pains in the left upper quadrant of the abdomen.  She had ultrasound findings of gallstones. She has had recent gastroenterology evaluation. Did have some findings of gastritis. Has been treated with omeprazole and is continuing to have these intermittent left upper quadrant pains and it appeared that her pains were likely attributed to cholelithiasis and surgery was recommended for definitive treatment.   DESCRIPTION OF PROCEDURE: The patient was placed on the operating table in the supine position under general endotracheal anesthesia. The abdomen was prepared with ChloraPrep and draped in a sterile manner. A short incision was made in the inferior aspect of the umbilicus and carried down to the deep fascia which was grasped with laryngeal hook and elevated. A Veress needle was inserted, aspirated and irrigated with a saline solution. Next, the peritoneal cavity was inflated with carbon dioxide. The Veress needle was removed. The 10 mm cannula was inserted. The 10 mm, 0 degree laparoscope was inserted to view the peritoneal cavity. The liver appeared normal. Another incision was made in the epigastrium just to the right of the midline to introduce an 11 mm cannula and 2 incisions were made in the lateral aspect of the right upper quadrant to introduce two 5-mm cannulas.   With the patient in the reverse Trendelenburg position and turned several degrees to the left, the gallbladder was retracted towards the right shoulder.  A number of adhesions were taken down with  blunt and sharp dissection and use of electrocautery. The infundibulum was retracted inferiorly and laterally. The cystic duct was dissected free from surrounding structures. The cystic artery was dissected free from surrounding structures. The neck of the gallbladder was mobilized with incision of the visceral peritoneum. A critical view of safety was demonstrated. An Endo Clip was placed across the cystic duct adjacent to the neck of the gallbladder. An incision was made in the cystic duct to introduce a Reddick catheter. Half-strength Conray-60 dye was injected as the cholangiogram was done with fluoroscopy.  The vertebral column did secure the distal common bile duct even when the patient was turned several degrees to the right, but there was prompt flow of dye into the duodenum. The biliary tree was not dilated and the biliary tree was observed and films sent for cholangiogram interpretation. The Reddick catheter was removed.   The cystic duct was doubly ligated with Endo clips and divided. The cystic artery was controlled with double Endo clips and divided. Several small tubular structures possibly ducts of Luschka  drugs wished were controlled with Endo Clip and divided. The gallbladder was dissected free from the liver with hook and cautery. The gallbladder was delivered out through the infraumbilical incision; however, it was somewhat bulky and did have to lengthen the incision by about a centimeter and also lengthen the fascial defect which the patient was quite obese and was fairly deep. The gallbladder was delivered up as it was opened and suctioned.  It did contain some dark bile. It did contain an approximately 1 cm gallstone and was submitted in formalin for routine pathology.  Next, it appeared that the fascia would need to be  closed at the umbilicus and used an Endo Close suture needle and used 0 Vicryl and place a figure-of-eight suture to approximate the fascia.  Following this, all cannulas  were removed.  The wounds were infiltrated with 0.5% Sensorcaine with epinephrine. The skin edges were approximated with interrupted 5-0 chromic subcuticular suture, benzoin and Steri-Strips. Dressings were applied with paper tape. The patient tolerated surgery satisfactorily and was prepared for transfer to the recovery room.    ____________________________ Lenna Sciara. Rochel Brome, MD jws:ct D: 11/02/2012 13:34:44 ET T: 11/02/2012 13:54:42 ET JOB#: [id number]  cc: Loreli Dollar, MD, <Dictator> Loreli Dollar MD ELECTRONICALLY SIGNED 11/07/2012 20:38

## 2015-01-16 LAB — BENZODIAZEPINES (GC/LC/MS), URINE
Alprazolam metabolite (GC/LC/MS), ur confirm: 2056 ng/mL (ref <25)
Clonazepam metabolite (GC/LC/MS), ur confirm: 409 ng/mL — AB (ref <25)
Flurazepam metabolite (GC/LC/MS), ur confirm: NEGATIVE ng/mL (ref <50)
Lorazepam (GC/LC/MS), ur confirm: NEGATIVE ng/mL (ref <50)
Midazolam (GC/LC/MS), ur confirm: NEGATIVE ng/mL (ref <50)
Nordiazepam (GC/LC/MS), ur confirm: NEGATIVE ng/mL (ref <50)
Oxazepam (GC/LC/MS), ur confirm: NEGATIVE ng/mL (ref <50)
Temazepam (GC/LC/MS), ur confirm: NEGATIVE ng/mL (ref <50)
Triazolam metabolite (GC/LC/MS), ur confirm: NEGATIVE ng/mL (ref <50)

## 2015-01-16 LAB — OPIATES/OPIOIDS (LC/MS-MS)
Codeine Urine: NEGATIVE ng/mL (ref <50)
Hydrocodone: 4178 ng/mL (ref <50)
Hydromorphone: 1021 ng/mL (ref <50)
Morphine Urine: NEGATIVE ng/mL (ref <50)
Norhydrocodone, Ur: 4727 ng/mL (ref <50)
Noroxycodone, Ur: NEGATIVE ng/mL (ref <50)
Oxycodone, ur: NEGATIVE ng/mL (ref <50)
Oxymorphone: NEGATIVE ng/mL (ref <50)

## 2015-01-17 LAB — PRESCRIPTION MONITORING PROFILE (SOLSTAS)
Amphetamine/Meth: NEGATIVE ng/mL
Barbiturate Screen, Urine: NEGATIVE ng/mL
Buprenorphine, Urine: NEGATIVE ng/mL
Cannabinoid Scrn, Ur: NEGATIVE ng/mL
Carisoprodol, Urine: NEGATIVE ng/mL
Cocaine Metabolites: NEGATIVE ng/mL
Creatinine, Urine: 439.39 mg/dL (ref >20.0)
Fentanyl, Ur: NEGATIVE ng/mL
MDMA URINE: NEGATIVE ng/mL
Meperidine, Ur: NEGATIVE ng/mL
Methadone Screen, Urine: NEGATIVE ng/mL
Nitrites, Initial: NEGATIVE ug/mL
Oxycodone Screen, Ur: NEGATIVE ng/mL
Propoxyphene: NEGATIVE ng/mL
Tapentadol, urine: NEGATIVE ng/mL
Tramadol Scrn, Ur: NEGATIVE ng/mL
Zolpidem, Urine: NEGATIVE ng/mL
pH, Initial: 5.4 pH (ref 4.5–8.9)

## 2015-01-20 NOTE — Progress Notes (Signed)
Urine drug screen for this encounter is consistent for prescribed medication.  There is inconsistent a positive clonazepam which by [employer] she is being prescribed by a 2nd MD other than the Xanax prescriber.

## 2015-01-26 DIAGNOSIS — J301 Allergic rhinitis due to pollen: Secondary | ICD-10-CM | POA: Diagnosis not present

## 2015-01-30 DIAGNOSIS — J301 Allergic rhinitis due to pollen: Secondary | ICD-10-CM | POA: Diagnosis not present

## 2015-02-07 DIAGNOSIS — F411 Generalized anxiety disorder: Secondary | ICD-10-CM | POA: Diagnosis not present

## 2015-02-07 DIAGNOSIS — F331 Major depressive disorder, recurrent, moderate: Secondary | ICD-10-CM | POA: Diagnosis not present

## 2015-02-09 DIAGNOSIS — R0602 Shortness of breath: Secondary | ICD-10-CM | POA: Diagnosis not present

## 2015-02-09 DIAGNOSIS — J3081 Allergic rhinitis due to animal (cat) (dog) hair and dander: Secondary | ICD-10-CM | POA: Diagnosis not present

## 2015-02-09 DIAGNOSIS — J301 Allergic rhinitis due to pollen: Secondary | ICD-10-CM | POA: Diagnosis not present

## 2015-02-09 DIAGNOSIS — G4733 Obstructive sleep apnea (adult) (pediatric): Secondary | ICD-10-CM | POA: Diagnosis not present

## 2015-02-09 DIAGNOSIS — J0101 Acute recurrent maxillary sinusitis: Secondary | ICD-10-CM | POA: Diagnosis not present

## 2015-02-09 DIAGNOSIS — F410 Panic disorder [episodic paroxysmal anxiety] without agoraphobia: Secondary | ICD-10-CM | POA: Diagnosis not present

## 2015-02-09 DIAGNOSIS — J3089 Other allergic rhinitis: Secondary | ICD-10-CM | POA: Diagnosis not present

## 2015-02-14 ENCOUNTER — Encounter: Payer: Medicare Other | Attending: Physical Medicine & Rehabilitation | Admitting: Physical Medicine & Rehabilitation

## 2015-02-14 ENCOUNTER — Telehealth: Payer: Self-pay | Admitting: *Deleted

## 2015-02-14 ENCOUNTER — Encounter: Payer: Self-pay | Admitting: Physical Medicine & Rehabilitation

## 2015-02-14 VITALS — BP 154/98 | HR 77 | Resp 16

## 2015-02-14 DIAGNOSIS — K589 Irritable bowel syndrome without diarrhea: Secondary | ICD-10-CM | POA: Diagnosis not present

## 2015-02-14 DIAGNOSIS — G2581 Restless legs syndrome: Secondary | ICD-10-CM | POA: Insufficient documentation

## 2015-02-14 DIAGNOSIS — R42 Dizziness and giddiness: Secondary | ICD-10-CM | POA: Diagnosis not present

## 2015-02-14 DIAGNOSIS — G44221 Chronic tension-type headache, intractable: Secondary | ICD-10-CM | POA: Diagnosis not present

## 2015-02-14 DIAGNOSIS — M47816 Spondylosis without myelopathy or radiculopathy, lumbar region: Secondary | ICD-10-CM

## 2015-02-14 DIAGNOSIS — E669 Obesity, unspecified: Secondary | ICD-10-CM | POA: Diagnosis not present

## 2015-02-14 DIAGNOSIS — M79671 Pain in right foot: Secondary | ICD-10-CM | POA: Insufficient documentation

## 2015-02-14 DIAGNOSIS — IMO0001 Reserved for inherently not codable concepts without codable children: Secondary | ICD-10-CM

## 2015-02-14 DIAGNOSIS — M797 Fibromyalgia: Secondary | ICD-10-CM | POA: Diagnosis not present

## 2015-02-14 DIAGNOSIS — G43909 Migraine, unspecified, not intractable, without status migrainosus: Secondary | ICD-10-CM | POA: Insufficient documentation

## 2015-02-14 DIAGNOSIS — J45998 Other asthma: Secondary | ICD-10-CM | POA: Diagnosis not present

## 2015-02-14 DIAGNOSIS — F418 Other specified anxiety disorders: Secondary | ICD-10-CM | POA: Insufficient documentation

## 2015-02-14 DIAGNOSIS — G8929 Other chronic pain: Secondary | ICD-10-CM | POA: Diagnosis not present

## 2015-02-14 DIAGNOSIS — R109 Unspecified abdominal pain: Secondary | ICD-10-CM | POA: Diagnosis not present

## 2015-02-14 DIAGNOSIS — G43019 Migraine without aura, intractable, without status migrainosus: Secondary | ICD-10-CM

## 2015-02-14 DIAGNOSIS — M609 Myositis, unspecified: Secondary | ICD-10-CM

## 2015-02-14 DIAGNOSIS — M25551 Pain in right hip: Secondary | ICD-10-CM | POA: Insufficient documentation

## 2015-02-14 DIAGNOSIS — M1990 Unspecified osteoarthritis, unspecified site: Secondary | ICD-10-CM | POA: Insufficient documentation

## 2015-02-14 DIAGNOSIS — Z760 Encounter for issue of repeat prescription: Secondary | ICD-10-CM | POA: Insufficient documentation

## 2015-02-14 DIAGNOSIS — F329 Major depressive disorder, single episode, unspecified: Secondary | ICD-10-CM

## 2015-02-14 DIAGNOSIS — N3010 Interstitial cystitis (chronic) without hematuria: Secondary | ICD-10-CM | POA: Insufficient documentation

## 2015-02-14 DIAGNOSIS — M545 Low back pain: Secondary | ICD-10-CM | POA: Insufficient documentation

## 2015-02-14 DIAGNOSIS — Z87442 Personal history of urinary calculi: Secondary | ICD-10-CM | POA: Diagnosis not present

## 2015-02-14 DIAGNOSIS — E785 Hyperlipidemia, unspecified: Secondary | ICD-10-CM | POA: Insufficient documentation

## 2015-02-14 DIAGNOSIS — N920 Excessive and frequent menstruation with regular cycle: Secondary | ICD-10-CM | POA: Diagnosis not present

## 2015-02-14 DIAGNOSIS — N393 Stress incontinence (female) (male): Secondary | ICD-10-CM | POA: Insufficient documentation

## 2015-02-14 DIAGNOSIS — G4733 Obstructive sleep apnea (adult) (pediatric): Secondary | ICD-10-CM | POA: Insufficient documentation

## 2015-02-14 DIAGNOSIS — K219 Gastro-esophageal reflux disease without esophagitis: Secondary | ICD-10-CM | POA: Diagnosis not present

## 2015-02-14 DIAGNOSIS — F32A Depression, unspecified: Secondary | ICD-10-CM

## 2015-02-14 DIAGNOSIS — M542 Cervicalgia: Secondary | ICD-10-CM | POA: Diagnosis not present

## 2015-02-14 DIAGNOSIS — M7918 Myalgia, other site: Secondary | ICD-10-CM

## 2015-02-14 DIAGNOSIS — M791 Myalgia: Secondary | ICD-10-CM

## 2015-02-14 MED ORDER — HYDROCODONE-ACETAMINOPHEN 10-325 MG PO TABS: ORAL_TABLET | ORAL | Status: DC

## 2015-02-14 NOTE — Patient Instructions (Addendum)
Call Dr. Silvio Pate regarding your Viibryd.  FIND TIME EACH DAY TO DO SOMETHING FOR YOURSELF.

## 2015-02-14 NOTE — Progress Notes (Signed)
Subjective:    Patient ID: Karina Greer, female    DOB: Jul 27, 1966, 49 y.o.   MRN: [id number]  HPI   Karina Greer is back in regard to her chronic pain. She had surgery on her right Achilles tendon in March of this year. She is in a walking boot currently. The injury has put a crimp in her exercise and general activity. The ankle remains quite tender. She is doing a little stretching at this point.   She was put on Viibrid for depression but doesn't like how it makes her feel angry and irritable. She sees Dr. Silvio Greer at the [facility] for her depression/anxiety.  She did some genetic testing on her drug metabolism---pt brought in a copy of results.  Still needs a dx code for TENS unit. Hasn't received.  Pain Inventory Average Pain 8 Pain Right Now 9 My pain is constant, sharp, burning and stabbing  In the last 24 hours, has pain interfered with the following? General activity 9 Relation with others 10 Enjoyment of life 10 What TIME of day is your pain at its worst? all Sleep (in general) Poor  Pain is worse with: walking, bending, sitting, inactivity, standing and some activites Pain improves with: rest, heat/ice, therapy/exercise, pacing activities, medication and TENS Relief from Meds: 6  Mobility how many minutes can you walk? 0 ability to climb steps?  yes do you drive?  yes  Function I need assistance with the following:  meal prep, household duties and shopping  Neuro/Psych bladder control problems trouble walking dizziness confusion depression anxiety  Prior Studies Any changes since last visit?  no  Physicians involved in your care Any changes since last visit?  no   Family History  Problem Relation Age of Onset  . Hypertension Mother   . Diabetes Father   . Fibromyalgia Sister   . Hypertension Other     Cancer, Cerebrovascular disease run on mother side of family   History   Social History  . Marital Status: Divorced    Spouse Name: N/A  .  Number of Children: 3  . Years of Education: HS   Occupational History  . Unemployed     Disability   Social History Main Topics  . Smoking status: Never Smoker   . Smokeless tobacco: Never Used  . Alcohol Use: No  . Drug Use: No  . Sexual Activity: Not on file   Other Topics Concern  . None   Social History Narrative   Past Surgical History  Procedure Laterality Date  . Laparoscopic ovarian cyst bx  2005    AND  URETEROSCOPIC LASER LITHO  STONE EXTRACTION  . Cysto/  urethral dilation/  hydrodistention/   instillation therapy  07-16-2010//   12-30-2007//   10-27-2006  . Transthoracic echocardiogram  06-07-2006    normal study/  ef 60-65%  . Exercise tolerence test  10-12-2010    NEGATIVE  ADEQUATE ETT/  NO ISCHEMIA OR EVIDENCE HIGH GRADE OBSTRUCTIVE CAD/  NO FURTHER TEST NEEDED  . Tubal ligation Bilateral 1995  . Shoulder open rotator cuff repair Right 2004  . Cholecystectomy  2014  . Hysteroscopy w/  novasure endometrial ablation  2014  . Cysto with hydrodistension N/A 01/26/2014    Procedure: CYSTOSCOPY/HYDRODISTENSION;  Surgeon: Bernestine Amass, MD;  Location: [facility];  Service: Urology;  Laterality: N/A;   Past Medical History  Diagnosis Date  . Fibromyalgia   . Depression   . Dyslipidemia   . Arthritis   .  GERD (gastroesophageal reflux disease)   . Chronic low back pain   . History of renal calculi   . RLS (restless legs syndrome)   . Interstitial cystitis   . IBS (irritable bowel syndrome)   . 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 PONV (postoperative nausea and vomiting)   . Anxiety disorder   . SI (sacroiliac) joint dysfunction   . SUI (stress urinary incontinence, female)   . Anxiety   . OSA on CPAP   . History of panic attacks   . Seasonal asthma   . Menorrhagia    BP 154/98 mmHg  Pulse 77  Resp 16  SpO2 97%  Opioid Risk Score:   Fall Risk Score: Moderate Fall Risk (6-13 points) (previously educated and given  handout)`1  Depression screen PHQ 2/9  Depression screen PHQ 2/9 12/07/2014  Decreased Interest 3  Down, Depressed, Hopeless 2  PHQ - 2 Score 5  Altered sleeping 3  Tired, decreased energy 3  Change in appetite 3  Feeling bad or failure about yourself  1  Trouble concentrating 3  Moving slowly or fidgety/restless 1  Suicidal thoughts 0  PHQ-9 Score 19     Review of Systems  Constitutional: Positive for appetite change.  Respiratory: Positive for apnea and shortness of breath.   Gastrointestinal: Positive for nausea.  Genitourinary:       Bladder control problems  Musculoskeletal: Positive for gait problem.  Neurological: Positive for dizziness.  Psychiatric/Behavioral: Positive for confusion and dysphoric mood. The patient is nervous/anxious.   All other systems reviewed and are negative.      Objective:   Physical Exam  Constitutional: She is oriented to person, place, and time. She appears well-developed and well-nourished.  She remains overweight  HENT:  Head: Normocephalic and atraumatic.  Eyes: Conjunctivae and EOM are normal. Pupils are equal, round, and reactive to light.  Neck: Normal range of motion.  Cardiovascular: Normal rate and regular rhythm.  Pulmonary/Chest: Effort normal and breath sounds normal.  Abdominal: Bowel sounds are normal.  Musculoskeletal:  Lumbar back: Right lumbar paraspinals are taut. Slight elevation of the right hemipelvis as well.  Patient can flex to about 80 degrees with mild pain. She was able to extend to 25 degrees with pain.  Neurological: She is alert and oriented to person, place, and time. She has normal strength. No cranial nerve deficit or sensory deficit. She displays a negative Romberg sign. STM and attentional deficits  Psych: Fair attention and awareness. Mood is generally pleasant   Assessment & Plan:   ASSESSMENT:  1. Fibromyalgia.  2. Anxiety and depression.  3. History of low back pain. Mild facet disease on  MRI and exam is consistent  4. OSA  5. Migraine headaches due to FMS, but has history of WML on MRI. (neurology is following)   PLAN:  1. Refilled hydrocodone today and for next month.  2. Working on acquiring TENS unit. Provided appropriate dx codes today. 3. Asked pt to follow up with her psychiatrist about viibryd--not having a good response 4. CPAP per pulmonology. Discussed use of cough medicines with patient.  5. Discussed finding an hour each day to have fun, relaxation, stress relief, etc. Might want discuss with psychiatry having a therapist to work on coping skills.  6. Follow up in two months with PA. I will see her back in about 4-6 months.

## 2015-02-14 NOTE — Telephone Encounter (Signed)
Pt seen earlier today by Dr. Naaman Plummer. She called [facility] about getting supplies for her TENS unit. The rep told her that her account has been closed. She received her unit back in 2013. They are asking for a new request/prescription in order to reopen her account and so she can get her supplies

## 2015-02-15 NOTE — Telephone Encounter (Addendum)
Rena needs a new orders sent to [facility] for TENS .  New order placed and faxed to [facility] [phone]

## 2015-02-22 DIAGNOSIS — J301 Allergic rhinitis due to pollen: Secondary | ICD-10-CM | POA: Diagnosis not present

## 2015-02-22 DIAGNOSIS — R0602 Shortness of breath: Secondary | ICD-10-CM | POA: Diagnosis not present

## 2015-02-22 DIAGNOSIS — J3081 Allergic rhinitis due to animal (cat) (dog) hair and dander: Secondary | ICD-10-CM | POA: Diagnosis not present

## 2015-02-22 DIAGNOSIS — J3089 Other allergic rhinitis: Secondary | ICD-10-CM | POA: Diagnosis not present

## 2015-02-23 DIAGNOSIS — F331 Major depressive disorder, recurrent, moderate: Secondary | ICD-10-CM | POA: Diagnosis not present

## 2015-02-23 DIAGNOSIS — F411 Generalized anxiety disorder: Secondary | ICD-10-CM | POA: Diagnosis not present

## 2015-03-01 ENCOUNTER — Telehealth: Payer: Self-pay | Admitting: *Deleted

## 2015-03-01 NOTE — Telephone Encounter (Signed)
Spoke to [name],  I asked if she will call the office after seeing her PCP she verbalizes understanding.

## 2015-03-01 NOTE — Telephone Encounter (Signed)
Pt says she is ill, is going to see PCP,

## 2015-03-01 NOTE — Telephone Encounter (Signed)
Pt is ill, going to see PCP tomorrow. She wants to report in advance that with way she is feeling, it might be that the PCP will prescribe a cough medication containing codeine. In the past she says she has rec'd tussinex.  She understands clinic policy and that is why she is calling.  She is asking you to call her back to see if it is okay

## 2015-03-02 ENCOUNTER — Telehealth: Payer: Self-pay | Admitting: *Deleted

## 2015-03-02 DIAGNOSIS — R05 Cough: Secondary | ICD-10-CM | POA: Diagnosis not present

## 2015-03-02 DIAGNOSIS — F418 Other specified anxiety disorders: Secondary | ICD-10-CM | POA: Diagnosis not present

## 2015-03-02 DIAGNOSIS — B079 Viral wart, unspecified: Secondary | ICD-10-CM | POA: Diagnosis not present

## 2015-03-02 DIAGNOSIS — L719 Rosacea, unspecified: Secondary | ICD-10-CM | POA: Diagnosis not present

## 2015-03-02 DIAGNOSIS — M67441 Ganglion, right hand: Secondary | ICD-10-CM | POA: Diagnosis not present

## 2015-03-02 DIAGNOSIS — Z6841 Body Mass Index (BMI) 40.0 and over, adult: Secondary | ICD-10-CM | POA: Diagnosis not present

## 2015-03-02 NOTE — Telephone Encounter (Signed)
Miss Buster called back to report that she was given a script for 4 oz's of Tussinex. She called back as instructed by Zella Ball.  I consulted [place].  I called the pt back and informed that she had permission to take the cough syrup containig codeine. I also passed on the instructions to NOT take the cough syrup and her pain meds at the same time. She understood clearly

## 2015-03-06 DIAGNOSIS — L719 Rosacea, unspecified: Secondary | ICD-10-CM | POA: Diagnosis not present

## 2015-03-07 DIAGNOSIS — F411 Generalized anxiety disorder: Secondary | ICD-10-CM | POA: Diagnosis not present

## 2015-03-07 DIAGNOSIS — F331 Major depressive disorder, recurrent, moderate: Secondary | ICD-10-CM | POA: Diagnosis not present

## 2015-03-09 DIAGNOSIS — J3089 Other allergic rhinitis: Secondary | ICD-10-CM | POA: Diagnosis not present

## 2015-03-09 DIAGNOSIS — J301 Allergic rhinitis due to pollen: Secondary | ICD-10-CM | POA: Diagnosis not present

## 2015-03-09 DIAGNOSIS — J3081 Allergic rhinitis due to animal (cat) (dog) hair and dander: Secondary | ICD-10-CM | POA: Diagnosis not present

## 2015-03-13 DIAGNOSIS — F331 Major depressive disorder, recurrent, moderate: Secondary | ICD-10-CM | POA: Diagnosis not present

## 2015-03-13 DIAGNOSIS — F411 Generalized anxiety disorder: Secondary | ICD-10-CM | POA: Diagnosis not present

## 2015-03-15 DIAGNOSIS — M25531 Pain in right wrist: Secondary | ICD-10-CM | POA: Diagnosis not present

## 2015-03-16 DIAGNOSIS — M25531 Pain in right wrist: Secondary | ICD-10-CM | POA: Diagnosis not present

## 2015-03-21 DIAGNOSIS — M25841 Other specified joint disorders, right hand: Secondary | ICD-10-CM | POA: Diagnosis not present

## 2015-03-21 DIAGNOSIS — M654 Radial styloid tenosynovitis [de Quervain]: Secondary | ICD-10-CM | POA: Diagnosis not present

## 2015-03-21 DIAGNOSIS — M25571 Pain in right ankle and joints of right foot: Secondary | ICD-10-CM | POA: Diagnosis not present

## 2015-03-21 DIAGNOSIS — M67449 Ganglion, unspecified hand: Secondary | ICD-10-CM | POA: Diagnosis not present

## 2015-03-21 DIAGNOSIS — M66361 Spontaneous rupture of flexor tendons, right lower leg: Secondary | ICD-10-CM | POA: Diagnosis not present

## 2015-03-22 DIAGNOSIS — J301 Allergic rhinitis due to pollen: Secondary | ICD-10-CM | POA: Diagnosis not present

## 2015-03-22 DIAGNOSIS — J3081 Allergic rhinitis due to animal (cat) (dog) hair and dander: Secondary | ICD-10-CM | POA: Diagnosis not present

## 2015-03-22 DIAGNOSIS — J3089 Other allergic rhinitis: Secondary | ICD-10-CM | POA: Diagnosis not present

## 2015-03-30 DIAGNOSIS — F411 Generalized anxiety disorder: Secondary | ICD-10-CM | POA: Diagnosis not present

## 2015-03-30 DIAGNOSIS — F331 Major depressive disorder, recurrent, moderate: Secondary | ICD-10-CM | POA: Diagnosis not present

## 2015-04-05 DIAGNOSIS — R140 Abdominal distension (gaseous): Secondary | ICD-10-CM | POA: Diagnosis not present

## 2015-04-05 DIAGNOSIS — M542 Cervicalgia: Secondary | ICD-10-CM | POA: Diagnosis not present

## 2015-04-05 DIAGNOSIS — R200 Anesthesia of skin: Secondary | ICD-10-CM | POA: Diagnosis not present

## 2015-04-05 DIAGNOSIS — R202 Paresthesia of skin: Secondary | ICD-10-CM | POA: Diagnosis not present

## 2015-04-05 DIAGNOSIS — R42 Dizziness and giddiness: Secondary | ICD-10-CM | POA: Diagnosis not present

## 2015-04-05 DIAGNOSIS — G44221 Chronic tension-type headache, intractable: Secondary | ICD-10-CM | POA: Diagnosis not present

## 2015-04-10 DIAGNOSIS — J3081 Allergic rhinitis due to animal (cat) (dog) hair and dander: Secondary | ICD-10-CM | POA: Diagnosis not present

## 2015-04-10 DIAGNOSIS — J3089 Other allergic rhinitis: Secondary | ICD-10-CM | POA: Diagnosis not present

## 2015-04-10 DIAGNOSIS — J301 Allergic rhinitis due to pollen: Secondary | ICD-10-CM | POA: Diagnosis not present

## 2015-04-17 ENCOUNTER — Encounter: Payer: Medicare Other | Attending: Physical Medicine & Rehabilitation | Admitting: Physical Medicine & Rehabilitation

## 2015-04-17 ENCOUNTER — Encounter: Payer: Self-pay | Admitting: Physical Medicine & Rehabilitation

## 2015-04-17 DIAGNOSIS — M25551 Pain in right hip: Secondary | ICD-10-CM | POA: Insufficient documentation

## 2015-04-17 DIAGNOSIS — F418 Other specified anxiety disorders: Secondary | ICD-10-CM | POA: Diagnosis not present

## 2015-04-17 DIAGNOSIS — M545 Low back pain: Secondary | ICD-10-CM | POA: Insufficient documentation

## 2015-04-17 DIAGNOSIS — M797 Fibromyalgia: Secondary | ICD-10-CM | POA: Insufficient documentation

## 2015-04-17 DIAGNOSIS — N920 Excessive and frequent menstruation with regular cycle: Secondary | ICD-10-CM | POA: Insufficient documentation

## 2015-04-17 DIAGNOSIS — N393 Stress incontinence (female) (male): Secondary | ICD-10-CM | POA: Insufficient documentation

## 2015-04-17 DIAGNOSIS — G8929 Other chronic pain: Secondary | ICD-10-CM | POA: Diagnosis not present

## 2015-04-17 DIAGNOSIS — G43009 Migraine without aura, not intractable, without status migrainosus: Secondary | ICD-10-CM | POA: Diagnosis not present

## 2015-04-17 DIAGNOSIS — G2581 Restless legs syndrome: Secondary | ICD-10-CM | POA: Diagnosis not present

## 2015-04-17 DIAGNOSIS — E785 Hyperlipidemia, unspecified: Secondary | ICD-10-CM | POA: Diagnosis not present

## 2015-04-17 DIAGNOSIS — F419 Anxiety disorder, unspecified: Secondary | ICD-10-CM

## 2015-04-17 DIAGNOSIS — N3010 Interstitial cystitis (chronic) without hematuria: Secondary | ICD-10-CM | POA: Diagnosis not present

## 2015-04-17 DIAGNOSIS — J45998 Other asthma: Secondary | ICD-10-CM | POA: Insufficient documentation

## 2015-04-17 DIAGNOSIS — Z760 Encounter for issue of repeat prescription: Secondary | ICD-10-CM | POA: Diagnosis not present

## 2015-04-17 DIAGNOSIS — M47816 Spondylosis without myelopathy or radiculopathy, lumbar region: Secondary | ICD-10-CM | POA: Diagnosis not present

## 2015-04-17 DIAGNOSIS — M1990 Unspecified osteoarthritis, unspecified site: Secondary | ICD-10-CM | POA: Insufficient documentation

## 2015-04-17 DIAGNOSIS — K219 Gastro-esophageal reflux disease without esophagitis: Secondary | ICD-10-CM | POA: Diagnosis not present

## 2015-04-17 DIAGNOSIS — G4733 Obstructive sleep apnea (adult) (pediatric): Secondary | ICD-10-CM | POA: Insufficient documentation

## 2015-04-17 DIAGNOSIS — M79671 Pain in right foot: Secondary | ICD-10-CM | POA: Diagnosis not present

## 2015-04-17 DIAGNOSIS — E669 Obesity, unspecified: Secondary | ICD-10-CM | POA: Diagnosis not present

## 2015-04-17 DIAGNOSIS — Z87442 Personal history of urinary calculi: Secondary | ICD-10-CM | POA: Insufficient documentation

## 2015-04-17 DIAGNOSIS — G43909 Migraine, unspecified, not intractable, without status migrainosus: Secondary | ICD-10-CM | POA: Diagnosis not present

## 2015-04-17 DIAGNOSIS — M791 Myalgia: Secondary | ICD-10-CM

## 2015-04-17 DIAGNOSIS — IMO0001 Reserved for inherently not codable concepts without codable children: Secondary | ICD-10-CM

## 2015-04-17 DIAGNOSIS — K589 Irritable bowel syndrome without diarrhea: Secondary | ICD-10-CM | POA: Insufficient documentation

## 2015-04-17 DIAGNOSIS — M609 Myositis, unspecified: Secondary | ICD-10-CM

## 2015-04-17 MED ORDER — HYDROCODONE-ACETAMINOPHEN 10-325 MG PO TABS: ORAL_TABLET | ORAL | Status: DC

## 2015-04-17 NOTE — Progress Notes (Signed)
Subjective:    Patient ID: Karina Greer, female    DOB: 11-Jan-1966, 49 y.o.   MRN: [id number]  HPI  Karina Greer is here in follow up of her chronic pain. She continues to see numerous providers who manage mood, ortho, neuro issues. She sees psychiatry for mood mgt, ortho for wrist and hand, and neurology for migraines. She continues to have headaches and insists that they are related to "polycystic" ovary disease. She wants to see an endocrinologist to run further tests on her. She hasn't seen her gynecologist for some time.   She is eating better and trying to work on stress mgt, she is trying to exercise when she can (now out of the boot).   Pain Inventory Average Pain 8 Pain Right Now 9 My pain is intermittent, constant, sharp and stabbing  In the last 24 hours, has pain interfered with the following? General activity 7 Relation with others 8 Enjoyment of life 8 What TIME of day is your pain at its worst? all Sleep (in general) Poor  Pain is worse with: standing and some activites Pain improves with: rest, heat/ice, therapy/exercise, pacing activities, medication and TENS Relief from Meds: 6  Mobility walk without assistance how many minutes can you walk? 10 ability to climb steps?  yes do you drive?  yes  Function I need assistance with the following:  meal prep, household duties and shopping  Neuro/Psych bladder control problems numbness tingling dizziness confusion depression anxiety  Prior Studies Any changes since last visit?  yes  Right wrist injection by Dr Lorin Mercy  Physicians involved in your care Any changes since last visit?  no   Family History  Problem Relation Age of Onset  . Hypertension Mother   . Diabetes Father   . Fibromyalgia Sister   . Hypertension Other     Cancer, Cerebrovascular disease run on mother side of family   History   Social History  . Marital Status: Divorced    Spouse Name: N/A  . Number of Children: 3  . Years of  Education: HS   Occupational History  . Unemployed     Disability   Social History Main Topics  . Smoking status: Never Smoker   . Smokeless tobacco: Never Used  . Alcohol Use: No  . Drug Use: No  . Sexual Activity: Not on file   Other Topics Concern  . None   Social History Narrative   Past Surgical History  Procedure Laterality Date  . Laparoscopic ovarian cyst bx  2005    AND  URETEROSCOPIC LASER LITHO  STONE EXTRACTION  . Cysto/  urethral dilation/  hydrodistention/   instillation therapy  07-16-2010//   12-30-2007//   10-27-2006  . Transthoracic echocardiogram  06-07-2006    normal study/  ef 60-65%  . Exercise tolerence test  10-12-2010    NEGATIVE  ADEQUATE ETT/  NO ISCHEMIA OR EVIDENCE HIGH GRADE OBSTRUCTIVE CAD/  NO FURTHER TEST NEEDED  . Tubal ligation Bilateral 1995  . Shoulder open rotator cuff repair Right 2004  . Cholecystectomy  2014  . Hysteroscopy w/  novasure endometrial ablation  2014  . Cysto with hydrodistension N/A 01/26/2014    Procedure: CYSTOSCOPY/HYDRODISTENSION;  Surgeon: Bernestine Amass, MD;  Location: [facility];  Service: Urology;  Laterality: N/A;   Past Medical History  Diagnosis Date  . Fibromyalgia   . Depression   . Dyslipidemia   . Arthritis   . GERD (gastroesophageal reflux disease)   .  Chronic low back pain   . History of renal calculi   . RLS (restless legs syndrome)   . Interstitial cystitis   . IBS (irritable bowel syndrome)   . 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 PONV (postoperative nausea and vomiting)   . Anxiety disorder   . SI (sacroiliac) joint dysfunction   . SUI (stress urinary incontinence, female)   . Anxiety   . OSA on CPAP   . History of panic attacks   . Seasonal asthma   . Menorrhagia    There were no vitals taken for this visit.  Opioid Risk Score:   Fall Risk Score:  `1  Depression screen PHQ 2/9  Depression screen [facility] 2/9 04/17/2015 12/07/2014  Decreased Interest 3 3    Down, Depressed, Hopeless 2 2  PHQ - 2 Score 5 5  Altered sleeping - 3  Tired, decreased energy - 3  Change in appetite - 3  Feeling bad or failure about yourself  - 1  Trouble concentrating - 3  Moving slowly or fidgety/restless - 1  Suicidal thoughts - 0  PHQ-9 Score - 19    Review of Systems  Constitutional: Positive for appetite change.       Poor  Gastrointestinal: Positive for nausea and abdominal pain.  Genitourinary:       Bladder control problems  Neurological: Positive for dizziness and numbness.       Tingling  Psychiatric/Behavioral: Positive for confusion and dysphoric mood. The patient is nervous/anxious.   All other systems reviewed and are negative.      Objective:   Physical Exam  Constitutional: She is oriented to person, place, and time. She appears well-developed and well-nourished.  She remains overweight but hasn't gained further HENT:  Head: Normocephalic and atraumatic.  Eyes: Conjunctivae and EOM are normal. Pupils are equal, round, and reactive to light.  Neck: Normal range of motion.  Cardiovascular: Normal rate and regular rhythm.  Pulmonary/Chest: Effort normal and breath sounds normal.  Abdominal: Bowel sounds are normal.  Musculoskeletal:  Lumbar back: Right lumbar paraspinals are taut. Slight elevation of the right hemipelvis as well.  Patient can flex to about 80 degrees with mild pain. She was able to extend to 25 degrees with pain.  Neurological: She is alert and oriented to person, place, and time. She has normal strength. No cranial nerve deficit or sensory deficit. She displays a negative Romberg sign. STM and attentional deficits  Psych: still anxious, but not overly so. Can occasionally become tearful when talking about herself.  Assessment & Plan:   ASSESSMENT:  1. Fibromyalgia.  2. Anxiety and depression.  3. History of low back pain. Mild facet disease on MRI and exam is consistent  4. OSA  5. Migraine headaches due to FMS,  (does have history of WML on MRI). (neurology is following)   PLAN:  1. Refilled hydrocodone today and for next month.  2. Headache mgt per [facility]---received riboflavin per neuro, VPA?---would check lft's before beginning  3. TENS:  Will contact [facility] re: TENS---hasn't received yet---will check on her 4. CPAP per pulmonology.    5. Again stress mgt and relaxation is most important for her.   6. Follow up in two months with PA. Fifteen minutes of face to face patient care time were spent during this visit. All questions were encouraged and answered.

## 2015-04-17 NOTE — Patient Instructions (Signed)
PLEASE CALL ME WITH ANY PROBLEMS OR QUESTIONS (#[phone]).  HAVE A GOOD DAY!    

## 2015-04-25 DIAGNOSIS — F411 Generalized anxiety disorder: Secondary | ICD-10-CM | POA: Diagnosis not present

## 2015-04-25 DIAGNOSIS — F331 Major depressive disorder, recurrent, moderate: Secondary | ICD-10-CM | POA: Diagnosis not present

## 2015-04-26 DIAGNOSIS — J3089 Other allergic rhinitis: Secondary | ICD-10-CM | POA: Diagnosis not present

## 2015-04-26 DIAGNOSIS — J3081 Allergic rhinitis due to animal (cat) (dog) hair and dander: Secondary | ICD-10-CM | POA: Diagnosis not present

## 2015-04-26 DIAGNOSIS — J301 Allergic rhinitis due to pollen: Secondary | ICD-10-CM | POA: Diagnosis not present

## 2015-05-09 DIAGNOSIS — F331 Major depressive disorder, recurrent, moderate: Secondary | ICD-10-CM | POA: Diagnosis not present

## 2015-05-09 DIAGNOSIS — J3081 Allergic rhinitis due to animal (cat) (dog) hair and dander: Secondary | ICD-10-CM | POA: Diagnosis not present

## 2015-05-09 DIAGNOSIS — J301 Allergic rhinitis due to pollen: Secondary | ICD-10-CM | POA: Diagnosis not present

## 2015-05-09 DIAGNOSIS — J3089 Other allergic rhinitis: Secondary | ICD-10-CM | POA: Diagnosis not present

## 2015-05-09 DIAGNOSIS — F411 Generalized anxiety disorder: Secondary | ICD-10-CM | POA: Diagnosis not present

## 2015-05-24 DIAGNOSIS — F331 Major depressive disorder, recurrent, moderate: Secondary | ICD-10-CM | POA: Diagnosis not present

## 2015-05-24 DIAGNOSIS — F411 Generalized anxiety disorder: Secondary | ICD-10-CM | POA: Diagnosis not present

## 2015-05-25 DIAGNOSIS — J3081 Allergic rhinitis due to animal (cat) (dog) hair and dander: Secondary | ICD-10-CM | POA: Diagnosis not present

## 2015-05-25 DIAGNOSIS — J3089 Other allergic rhinitis: Secondary | ICD-10-CM | POA: Diagnosis not present

## 2015-05-25 DIAGNOSIS — M67449 Ganglion, unspecified hand: Secondary | ICD-10-CM | POA: Diagnosis not present

## 2015-05-25 DIAGNOSIS — J301 Allergic rhinitis due to pollen: Secondary | ICD-10-CM | POA: Diagnosis not present

## 2015-05-25 DIAGNOSIS — M654 Radial styloid tenosynovitis [de Quervain]: Secondary | ICD-10-CM | POA: Diagnosis not present

## 2015-05-25 DIAGNOSIS — M66361 Spontaneous rupture of flexor tendons, right lower leg: Secondary | ICD-10-CM | POA: Diagnosis not present

## 2015-06-01 DIAGNOSIS — J301 Allergic rhinitis due to pollen: Secondary | ICD-10-CM | POA: Diagnosis not present

## 2015-06-01 DIAGNOSIS — G4733 Obstructive sleep apnea (adult) (pediatric): Secondary | ICD-10-CM | POA: Diagnosis not present

## 2015-06-05 DIAGNOSIS — F411 Generalized anxiety disorder: Secondary | ICD-10-CM | POA: Diagnosis not present

## 2015-06-05 DIAGNOSIS — F331 Major depressive disorder, recurrent, moderate: Secondary | ICD-10-CM | POA: Diagnosis not present

## 2015-06-08 DIAGNOSIS — B079 Viral wart, unspecified: Secondary | ICD-10-CM | POA: Diagnosis not present

## 2015-06-08 DIAGNOSIS — Z6841 Body Mass Index (BMI) 40.0 and over, adult: Secondary | ICD-10-CM | POA: Diagnosis not present

## 2015-06-12 ENCOUNTER — Encounter: Payer: Medicare Other | Attending: Physical Medicine & Rehabilitation | Admitting: Registered Nurse

## 2015-06-12 ENCOUNTER — Encounter: Payer: Self-pay | Admitting: Registered Nurse

## 2015-06-12 ENCOUNTER — Other Ambulatory Visit: Payer: Self-pay | Admitting: Registered Nurse

## 2015-06-12 VITALS — BP 142/96 | HR 81

## 2015-06-12 DIAGNOSIS — G4733 Obstructive sleep apnea (adult) (pediatric): Secondary | ICD-10-CM | POA: Insufficient documentation

## 2015-06-12 DIAGNOSIS — M25551 Pain in right hip: Secondary | ICD-10-CM | POA: Diagnosis not present

## 2015-06-12 DIAGNOSIS — N393 Stress incontinence (female) (male): Secondary | ICD-10-CM | POA: Diagnosis not present

## 2015-06-12 DIAGNOSIS — N3010 Interstitial cystitis (chronic) without hematuria: Secondary | ICD-10-CM | POA: Diagnosis not present

## 2015-06-12 DIAGNOSIS — K589 Irritable bowel syndrome without diarrhea: Secondary | ICD-10-CM | POA: Insufficient documentation

## 2015-06-12 DIAGNOSIS — Z79899 Other long term (current) drug therapy: Secondary | ICD-10-CM

## 2015-06-12 DIAGNOSIS — Z87442 Personal history of urinary calculi: Secondary | ICD-10-CM | POA: Insufficient documentation

## 2015-06-12 DIAGNOSIS — G2581 Restless legs syndrome: Secondary | ICD-10-CM | POA: Diagnosis not present

## 2015-06-12 DIAGNOSIS — K219 Gastro-esophageal reflux disease without esophagitis: Secondary | ICD-10-CM | POA: Diagnosis not present

## 2015-06-12 DIAGNOSIS — E785 Hyperlipidemia, unspecified: Secondary | ICD-10-CM | POA: Insufficient documentation

## 2015-06-12 DIAGNOSIS — Z5181 Encounter for therapeutic drug level monitoring: Secondary | ICD-10-CM | POA: Diagnosis not present

## 2015-06-12 DIAGNOSIS — M79671 Pain in right foot: Secondary | ICD-10-CM | POA: Diagnosis not present

## 2015-06-12 DIAGNOSIS — M609 Myositis, unspecified: Secondary | ICD-10-CM | POA: Diagnosis not present

## 2015-06-12 DIAGNOSIS — M797 Fibromyalgia: Secondary | ICD-10-CM | POA: Insufficient documentation

## 2015-06-12 DIAGNOSIS — F418 Other specified anxiety disorders: Secondary | ICD-10-CM | POA: Insufficient documentation

## 2015-06-12 DIAGNOSIS — M545 Low back pain: Secondary | ICD-10-CM | POA: Diagnosis not present

## 2015-06-12 DIAGNOSIS — R5381 Other malaise: Secondary | ICD-10-CM

## 2015-06-12 DIAGNOSIS — IMO0001 Reserved for inherently not codable concepts without codable children: Secondary | ICD-10-CM

## 2015-06-12 DIAGNOSIS — G8929 Other chronic pain: Secondary | ICD-10-CM | POA: Insufficient documentation

## 2015-06-12 DIAGNOSIS — M47816 Spondylosis without myelopathy or radiculopathy, lumbar region: Secondary | ICD-10-CM | POA: Diagnosis not present

## 2015-06-12 DIAGNOSIS — M1990 Unspecified osteoarthritis, unspecified site: Secondary | ICD-10-CM | POA: Insufficient documentation

## 2015-06-12 DIAGNOSIS — M791 Myalgia: Secondary | ICD-10-CM

## 2015-06-12 DIAGNOSIS — G43009 Migraine without aura, not intractable, without status migrainosus: Secondary | ICD-10-CM

## 2015-06-12 DIAGNOSIS — G43909 Migraine, unspecified, not intractable, without status migrainosus: Secondary | ICD-10-CM | POA: Insufficient documentation

## 2015-06-12 DIAGNOSIS — J45998 Other asthma: Secondary | ICD-10-CM | POA: Insufficient documentation

## 2015-06-12 DIAGNOSIS — Z760 Encounter for issue of repeat prescription: Secondary | ICD-10-CM | POA: Diagnosis not present

## 2015-06-12 DIAGNOSIS — G839 Paralytic syndrome, unspecified: Secondary | ICD-10-CM | POA: Diagnosis not present

## 2015-06-12 DIAGNOSIS — F419 Anxiety disorder, unspecified: Secondary | ICD-10-CM

## 2015-06-12 DIAGNOSIS — E669 Obesity, unspecified: Secondary | ICD-10-CM | POA: Diagnosis not present

## 2015-06-12 DIAGNOSIS — N920 Excessive and frequent menstruation with regular cycle: Secondary | ICD-10-CM | POA: Diagnosis not present

## 2015-06-12 MED ORDER — HYDROCODONE-ACETAMINOPHEN 10-325 MG PO TABS: ORAL_TABLET | ORAL | Status: DC

## 2015-06-12 NOTE — Progress Notes (Signed)
Subjective:    Patient ID: Karina Greer, female    DOB: 1966-03-07, 49 y.o.   MRN: [id number]  HPI: [name] is a 49 year old female who returns for follow up for chronic pain and medication refill. She says her pain is located in her mid- lower back, right hip and right leg. She rates her pain 9. Her current exercise regime walking short distances, foot exercises and performing stretching exercises. She admits to physical decline will order physical therapy for deconditioning.  Pain Inventory Average Pain 7 Pain Right Now 9 My pain is constant, sharp, stabbing, tingling and aching  In the last 24 hours, has pain interfered with the following? General activity 6 Relation with others 7 Enjoyment of life 7 What TIME of day is your pain at its worst? morning,daytime, evening, night Sleep (in general) Fair  Pain is worse with: walking, bending, sitting, inactivity, standing and some activites Pain improves with: rest, heat/ice, therapy/exercise, pacing activities, medication and TENS Relief from Meds: na  Mobility walk without assistance how many minutes can you walk? 10 ability to climb steps?  yes do you drive?  yes Do you have any goals in this area?  yes  Function disabled: date disabled 2005 I need assistance with the following:  meal prep, household duties and shopping Do you have any goals in this area?  yes  Neuro/Psych bladder control problems weakness trouble walking dizziness confusion depression anxiety  Prior Studies Any changes since last visit?  no x-rays CT/MRI nerve study  Physicians involved in your care Any changes since last visit?  no Primary care na Neurologist na Psychiatrist na Orthopedist na Psychologist na   Family History  Problem Relation Age of Onset  . Hypertension Mother   . Diabetes Father   . Fibromyalgia Sister   . Hypertension Other     Cancer, Cerebrovascular disease run on mother side of family   Social  History   Social History  . Marital Status: Divorced    Spouse Name: N/A  . Number of Children: 3  . Years of Education: HS   Occupational History  . Unemployed     Disability   Social History Main Topics  . Smoking status: Never Smoker   . Smokeless tobacco: Never Used  . Alcohol Use: No  . Drug Use: No  . Sexual Activity: Not Asked   Other Topics Concern  . None   Social History Narrative   Past Surgical History  Procedure Laterality Date  . Laparoscopic ovarian cyst bx  2005    AND  URETEROSCOPIC LASER LITHO  STONE EXTRACTION  . Cysto/  urethral dilation/  hydrodistention/   instillation therapy  07-16-2010//   12-30-2007//   10-27-2006  . Transthoracic echocardiogram  06-07-2006    normal study/  ef 60-65%  . Exercise tolerence test  10-12-2010    NEGATIVE  ADEQUATE ETT/  NO ISCHEMIA OR EVIDENCE HIGH GRADE OBSTRUCTIVE CAD/  NO FURTHER TEST NEEDED  . Tubal ligation Bilateral 1995  . Shoulder open rotator cuff repair Right 2004  . Cholecystectomy  2014  . Hysteroscopy w/  novasure endometrial ablation  2014  . Cysto with hydrodistension N/A 01/26/2014    Procedure: CYSTOSCOPY/HYDRODISTENSION;  Surgeon: Bernestine Amass, MD;  Location: [facility];  Service: Urology;  Laterality: N/A;   Past Medical History  Diagnosis Date  . Fibromyalgia   . Depression   . Dyslipidemia   . Arthritis   . GERD (  gastroesophageal reflux disease)   . Chronic low back pain   . History of renal calculi   . RLS (restless legs syndrome)   . Interstitial cystitis   . IBS (irritable bowel syndrome)   . 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 PONV (postoperative nausea and vomiting)   . Anxiety disorder   . SI (sacroiliac) joint dysfunction   . SUI (stress urinary incontinence, female)   . Anxiety   . OSA on CPAP   . History of panic attacks   . Seasonal asthma   . Menorrhagia    BP 146/91 mmHg  Pulse 106  SpO2 98%  Opioid Risk Score:   Fall Risk  Score:  `1  Depression screen PHQ 2/9  Depression screen [facility] 2/9 06/12/2015 04/17/2015 12/07/2014  Decreased Interest 3 3 3   Down, Depressed, Hopeless 3 2 2   PHQ - 2 Score 6 5 5   Altered sleeping - - 3  Tired, decreased energy - - 3  Change in appetite - - 3  Feeling bad or failure about yourself  - - 1  Trouble concentrating - - 3  Moving slowly or fidgety/restless - - 1  Suicidal thoughts - - 0  PHQ-9 Score - - 19     Review of Systems  Constitutional: Positive for appetite change and unexpected weight change.  Respiratory: Positive for apnea.   Gastrointestinal: Positive for nausea.  Skin: Positive for rash.  All other systems reviewed and are negative.      Objective:   Physical Exam  Constitutional: She is oriented to person, place, and time. She appears well-developed and well-nourished.  HENT:  Head: Normocephalic and atraumatic.  Neck: Normal range of motion. Neck supple.  Cardiovascular: Normal rate and regular rhythm.   Pulmonary/Chest: Effort normal and breath sounds normal.  Musculoskeletal:  Normal Muscle Bulk and Muscle Testing Reveals: Upper Extremities: Full ROM and Muscle Strength 5/5 Spinal Forward Flexion 45 Degrees and Extension 20 Degrees Thoracic Paraspinal Tenderness: T-3- T-6 Lumbar Paraspinal Tenderness: L-3- L-5 Lower Extremities: Full ROM and Muscle Strength 5/5 Bilateral Lower Extremities Flexion Produces Pain into Lumbar Arises from chair slowly Antalgic Gait  Neurological: She is alert and oriented to person, place, and time.  Skin: Skin is warm and dry.  Psychiatric: She has a normal mood and affect.  Nursing note and vitals reviewed.         Assessment & Plan:  1. Lumbago:  Refilled: HYDROcodone 10/325mg  one tablet every 6 hours as needed #120 second script given for the following month.  2. Fibromyalgia. Continue Current exercise Regime  3. Anxiety and depression: Dr. Jordan Hawks Psychiatrist Following was prescribed Klonopin.  Continue Counseling at [facility] 4. Migraines: On Maxalt. Neurology Following  5. OSA : Continue exercise regime as tolerated and losing weight.  6. Obesity: Following Healthy Diet Regimen 7. Status Post Right Foot Surgery: Dr. Lorin Mercy Following 8. Physical Deconditioning: RX: Physical Therapy 20 minutes of face to face patient care time was spent during this visit. All questions were encouraged and answered.   F/U in 2 Month

## 2015-06-13 LAB — PMP ALCOHOL METABOLITE (ETG): Ethyl Glucuronide (EtG): NEGATIVE ng/mL

## 2015-06-14 DIAGNOSIS — J3081 Allergic rhinitis due to animal (cat) (dog) hair and dander: Secondary | ICD-10-CM | POA: Diagnosis not present

## 2015-06-14 DIAGNOSIS — J301 Allergic rhinitis due to pollen: Secondary | ICD-10-CM | POA: Diagnosis not present

## 2015-06-14 DIAGNOSIS — J3089 Other allergic rhinitis: Secondary | ICD-10-CM | POA: Diagnosis not present

## 2015-06-15 DIAGNOSIS — J301 Allergic rhinitis due to pollen: Secondary | ICD-10-CM | POA: Diagnosis not present

## 2015-06-16 LAB — OPIATES/OPIOIDS (LC/MS-MS)
Codeine Urine: NEGATIVE ng/mL (ref <50)
Hydrocodone: 1773 ng/mL (ref <50)
Hydromorphone: 279 ng/mL (ref <50)
Morphine Urine: NEGATIVE ng/mL (ref <50)
Norhydrocodone, Ur: 1641 ng/mL (ref <50)
Noroxycodone, Ur: NEGATIVE ng/mL (ref <50)
Oxycodone, ur: NEGATIVE ng/mL (ref <50)
Oxymorphone: NEGATIVE ng/mL (ref <50)

## 2015-06-16 LAB — BENZODIAZEPINES (GC/LC/MS), URINE
Alprazolam metabolite (GC/LC/MS), ur confirm: 169 ng/mL — AB (ref <25)
Clonazepam metabolite (GC/LC/MS), ur confirm: NEGATIVE ng/mL (ref <25)
Flurazepam metabolite (GC/LC/MS), ur confirm: NEGATIVE ng/mL (ref <50)
Lorazepam (GC/LC/MS), ur confirm: NEGATIVE ng/mL (ref <50)
Midazolam (GC/LC/MS), ur confirm: NEGATIVE ng/mL (ref <50)
Nordiazepam (GC/LC/MS), ur confirm: NEGATIVE ng/mL (ref <50)
Oxazepam (GC/LC/MS), ur confirm: NEGATIVE ng/mL (ref <50)
Temazepam (GC/LC/MS), ur confirm: NEGATIVE ng/mL (ref <50)
Triazolam metabolite (GC/LC/MS), ur confirm: NEGATIVE ng/mL (ref <50)

## 2015-06-17 LAB — PRESCRIPTION MONITORING PROFILE (SOLSTAS)
Amphetamine/Meth: NEGATIVE ng/mL
Barbiturate Screen, Urine: NEGATIVE ng/mL
Buprenorphine, Urine: NEGATIVE ng/mL
Cannabinoid Scrn, Ur: NEGATIVE ng/mL
Carisoprodol, Urine: NEGATIVE ng/mL
Cocaine Metabolites: NEGATIVE ng/mL
Creatinine, Urine: 130.92 mg/dL (ref >20.0)
Fentanyl, Ur: NEGATIVE ng/mL
MDMA URINE: NEGATIVE ng/mL
Meperidine, Ur: NEGATIVE ng/mL
Methadone Screen, Urine: NEGATIVE ng/mL
Nitrites, Initial: NEGATIVE ug/mL
Oxycodone Screen, Ur: NEGATIVE ng/mL
Propoxyphene: NEGATIVE ng/mL
Tapentadol, urine: NEGATIVE ng/mL
Tramadol Scrn, Ur: NEGATIVE ng/mL
Zolpidem, Urine: NEGATIVE ng/mL
pH, Initial: 5.4 pH (ref 4.5–8.9)

## 2015-06-22 ENCOUNTER — Ambulatory Visit: Payer: Medicare Other | Attending: Registered Nurse | Admitting: Physical Therapy

## 2015-06-22 DIAGNOSIS — M545 Low back pain, unspecified: Secondary | ICD-10-CM

## 2015-06-22 DIAGNOSIS — R531 Weakness: Secondary | ICD-10-CM | POA: Insufficient documentation

## 2015-06-22 DIAGNOSIS — R262 Difficulty in walking, not elsewhere classified: Secondary | ICD-10-CM | POA: Diagnosis not present

## 2015-06-22 DIAGNOSIS — F331 Major depressive disorder, recurrent, moderate: Secondary | ICD-10-CM | POA: Diagnosis not present

## 2015-06-22 DIAGNOSIS — F411 Generalized anxiety disorder: Secondary | ICD-10-CM | POA: Diagnosis not present

## 2015-06-22 NOTE — Therapy (Addendum)
[place] MAIN [facility] SERVICES [address] [place], [place], [address] Phone: [phone]   Fax:  [phone]  Physical Therapy Evaluation  Patient Details  Name: Karina Greer MRN: [id number] Date of Birth: November 05, 1965 Referring Provider:  Bayard Hugger, NP  Encounter Date: 06/22/2015      PT End of Session - 06/22/15 1411    Visit Number 1   Number of Visits 25   PT Start Time 0100   PT Stop Time 0200   PT Time Calculation (min) 60 min   Activity Tolerance Patient limited by fatigue;Patient limited by pain   Behavior During Therapy [facility] for tasks assessed/performed;Restless      Past Medical History  Diagnosis Date  . Fibromyalgia   . Depression   . Dyslipidemia   . Arthritis   . GERD (gastroesophageal reflux disease)   . Chronic low back pain   . History of renal calculi   . RLS (restless legs syndrome)   . Interstitial cystitis   . IBS (irritable bowel syndrome)   . 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 PONV (postoperative nausea and vomiting)   . Anxiety disorder   . SI (sacroiliac) joint dysfunction   . SUI (stress urinary incontinence, female)   . Anxiety   . OSA on CPAP   . History of panic attacks   . Seasonal asthma   . Menorrhagia     Past Surgical History  Procedure Laterality Date  . Laparoscopic ovarian cyst bx  2005    AND  URETEROSCOPIC LASER LITHO  STONE EXTRACTION  . Cysto/  urethral dilation/  hydrodistention/   instillation therapy  07-16-2010//   12-30-2007//   10-27-2006  . Transthoracic echocardiogram  06-07-2006    normal study/  ef 60-65%  . Exercise tolerence test  10-12-2010    NEGATIVE  ADEQUATE ETT/  NO ISCHEMIA OR EVIDENCE HIGH GRADE OBSTRUCTIVE CAD/  NO FURTHER TEST NEEDED  . Tubal ligation Bilateral 1995  . Shoulder open rotator cuff repair Right 2004  . Cholecystectomy  2014  . Hysteroscopy w/  novasure endometrial ablation  2014  . Cysto with hydrodistension N/A  01/26/2014    Procedure: CYSTOSCOPY/HYDRODISTENSION;  Surgeon: Bernestine Amass, MD;  Location: [facility];  Service: Urology;  Laterality: N/A;    There were no vitals filed for this visit.  Visit Diagnosis:  Low back pain at multiple sites - Plan: PT plan of care cert/re-cert  Weakness - Plan: PT plan of care cert/re-cert  Difficulty walking - Plan: PT plan of care cert/re-cert      Subjective Assessment - 06/22/15 1311    Subjective Patient is having 7/10 today, and pins and needles in her BLE.,    Currently in Pain? Yes            [facility] PT Assessment - 06/22/15 0001    Assessment   Medical Diagnosis low back pain   Onset Date/Surgical Date 06/12/15   Hand Dominance Right   Next MD Visit 08/08/15   Prior Therapy --  Patient thinks that she had therapy for her back pain a long   Precautions   Precautions None   Balance Screen   Has the patient fallen in the past 6 months Yes   How many times? 3   Has the patient had a decrease in activity level because of a fear of falling?  Yes   Is the patient reluctant to leave their home  because of a fear of falling?  No   Home Social worker Private residence   Living Arrangements Children   Available Help at Discharge Family   Type of [place] to enter   Entrance Stairs-Number of Steps 6   Entrance Stairs-Rails Right;Left;Can reach both   [place] One level   [place] - 2 wheels;Shower seat   Prior Function   Level of Independence Independent     PAIN: Patient has intermittent pain that ranges from 4-10/10 in back and radiates down both legs intermittently  POSTURE: WFL Palpation: + spring test L1-L5, tender to palpation L1-L5 and paraspinal muscles Accessory ; hypomobilie L 1-L5   PROM/AROM:  STRENGTH:  Graded on a 0-5 scale Muscle Group Left Right  Shoulder flex    Shoulder Abd    Shoulder Ext    Shoulder IR/ER    Elbow    Wrist/hand     Hip Flex 4 4  Hip Abd 4 4  Hip Add 3 3  Hip Ext 3 3  Hip IR/ER 3 3  Knee Flex 4 4  Knee Ext 4 4  Ankle DF 5 5  Ankle PF 5 5   SENSATION: numbness and tingling BLE from hip to foot   SPECIAL TESTS:  + SLR bilaterally, - prone knee flex test, + FABER Repeated movement improved  With extension and RSB, LSB was painful , flex had no effect21.59   FUNCTIONAL MOBILITY: indpendent  :   GAIT: antalgic gait with RLE foot pain , R hip pain and low back pain that gets worse with walking OUTCOME MEASURES: TEST Outcome Interpretation  5 times sit<>stand 21.50sec >58 yo, >15 sec indicates increased risk for falls  10 meter walk test   1.14              m/s <1.0 m/s indicates increased risk for falls; limited community ambulator  Timed up and Go   9.50               sec <14 sec indicates increased risk for falls  6 minute walk test   960             Feet 1000 feet is community Water quality scientist na <36/56 (100% risk for falls), 37-45 (80% risk for falls); 46-51 (>50% risk for falls); 52-55 (lower risk <25% of falls)  9 Hole Peg Test L:   na             R: na                                  PT Long Term Goals - 06/22/15 1408    PT LONG TERM GOAL #1   Title Patient will report a worst pain of 3/10 on VAS in      back and right hip       to improve tolerance with ADLs and reduced symptoms with activities  09/14/15   PT LONG TERM GOAL #2   Title Patient will be modified independent in walking on even/uneven surface with least restrictive assistive device, for 20+ minutes without rest break, reporting some difficulty or less to improve walking tolerance with community ambulation including grocery shopping, going to church,09/14/15   PT LONG TERM GOAL #3   Title Patient will be able to perform household work/ chores without increase in symptoms.  09/14/15   PT LONG TERM GOAL #4   Title Patient will increase six minute walk test distance to >1000 for  progression to community ambulator and improve gait ability 09/14/15               Plan - 07-21-2015 1344    Clinical Impression Statement Patient is 49 yr old female with multiple complaints of pain: right foot, neck, low back, right hip. She had foot surgery on achilels tendon in march 2017 and has been in a boot or a cast beginning october 2015 until June 2016. She develpoped right hip pain and her chronic low bac pain has gotten worse. She is concerned about her weight gain and overall health.    Pt will benefit from skilled therapeutic intervention in order to improve on the following deficits Abnormal gait;Impaired flexibility;Dizziness;Decreased range of motion;Decreased mobility;Difficulty walking;Decreased strength;Impaired sensation;Obesity;Pain   Rehab Potential Fair   PT Frequency 2x / week   PT Duration 12 weeks   PT Treatment/Interventions Cryotherapy;Electrical Stimulation;Moist Heat;Traction;Therapeutic exercise;Therapeutic activities;Stair training;Gait training;Manual techniques   PT Next Visit Plan core strengthening, modalities for pain control   Consulted and Agree with Plan of Care Patient          G-Codes - 07/21/2015 1541    Functional Assessment Tool Used 5 x sit to stand, TUG, 6 MW 10  MW   Functional Limitation Mobility: Walking and moving around   Mobility: Walking and Moving Around Current Status [phone]) At least 20 percent but less than 40 percent impaired, limited or restricted   Mobility: Walking and Moving Around Goal Status [phone]) At least 1 percent but less than 20 percent impaired, limited or restricted       Problem List Patient Active Problem List   Diagnosis Date Noted  . Anxiety 04/18/2014  . Chronic arthritis 04/18/2014  . Cystitis 04/18/2014  . Extreme obesity 04/18/2014  . Fibrositis 04/18/2014  . 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 Cephalalgia 04/04/2014  . Cervical pain  04/04/2014  . Numbness and tingling 04/04/2014  . 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 Other nonspecific abnormal result of function study of brain and central nervous system 07/01/2012  . Disturbance of skin sensation 07/01/2012  . Migraine without aura 07/01/2012  . Lumbar spondylosis 02/04/2012  . Myalgia and myositis 02/04/2012  . Depression 02/04/2012  . OBESITY, UNSPECIFIED 10/03/2010  . GENERALIZED ANXIETY DISORDER 10/03/2010  . CHEST PAIN, PRECORDIAL 10/03/2010    Alanson Puls Jul 21, 2015, 3:42 PM  [place] MAIN [facility] SERVICES [address] [place], [place], [address] Phone: [phone]   Fax:  [phone]

## 2015-06-27 ENCOUNTER — Ambulatory Visit: Payer: Medicare Other | Admitting: Physical Therapy

## 2015-06-28 DIAGNOSIS — J3081 Allergic rhinitis due to animal (cat) (dog) hair and dander: Secondary | ICD-10-CM | POA: Diagnosis not present

## 2015-06-28 DIAGNOSIS — F331 Major depressive disorder, recurrent, moderate: Secondary | ICD-10-CM | POA: Diagnosis not present

## 2015-06-28 DIAGNOSIS — J301 Allergic rhinitis due to pollen: Secondary | ICD-10-CM | POA: Diagnosis not present

## 2015-06-28 DIAGNOSIS — F411 Generalized anxiety disorder: Secondary | ICD-10-CM | POA: Diagnosis not present

## 2015-06-28 DIAGNOSIS — J3089 Other allergic rhinitis: Secondary | ICD-10-CM | POA: Diagnosis not present

## 2015-06-29 ENCOUNTER — Ambulatory Visit: Payer: Medicare Other | Admitting: Physical Therapy

## 2015-06-29 DIAGNOSIS — L219 Seborrheic dermatitis, unspecified: Secondary | ICD-10-CM | POA: Diagnosis not present

## 2015-06-29 DIAGNOSIS — K219 Gastro-esophageal reflux disease without esophagitis: Secondary | ICD-10-CM | POA: Diagnosis not present

## 2015-06-29 DIAGNOSIS — R490 Dysphonia: Secondary | ICD-10-CM | POA: Diagnosis not present

## 2015-06-29 DIAGNOSIS — F411 Generalized anxiety disorder: Secondary | ICD-10-CM | POA: Diagnosis not present

## 2015-06-29 DIAGNOSIS — B079 Viral wart, unspecified: Secondary | ICD-10-CM | POA: Diagnosis not present

## 2015-06-29 DIAGNOSIS — E049 Nontoxic goiter, unspecified: Secondary | ICD-10-CM | POA: Diagnosis not present

## 2015-06-29 DIAGNOSIS — F331 Major depressive disorder, recurrent, moderate: Secondary | ICD-10-CM | POA: Diagnosis not present

## 2015-06-30 DIAGNOSIS — M79671 Pain in right foot: Secondary | ICD-10-CM | POA: Diagnosis not present

## 2015-06-30 DIAGNOSIS — M7661 Achilles tendinitis, right leg: Secondary | ICD-10-CM | POA: Diagnosis not present

## 2015-07-04 ENCOUNTER — Ambulatory Visit: Payer: Medicare Other | Attending: Registered Nurse | Admitting: Physical Therapy

## 2015-07-06 ENCOUNTER — Ambulatory Visit: Payer: Medicare Other | Admitting: Physical Therapy

## 2015-07-06 NOTE — Progress Notes (Signed)
Urine drug screen for this encounter is consistent for prescribed medication 

## 2015-07-07 DIAGNOSIS — F411 Generalized anxiety disorder: Secondary | ICD-10-CM | POA: Diagnosis not present

## 2015-07-07 DIAGNOSIS — F331 Major depressive disorder, recurrent, moderate: Secondary | ICD-10-CM | POA: Diagnosis not present

## 2015-07-07 DIAGNOSIS — Z23 Encounter for immunization: Secondary | ICD-10-CM | POA: Diagnosis not present

## 2015-07-13 DIAGNOSIS — E041 Nontoxic single thyroid nodule: Secondary | ICD-10-CM | POA: Diagnosis not present

## 2015-07-13 DIAGNOSIS — J301 Allergic rhinitis due to pollen: Secondary | ICD-10-CM | POA: Diagnosis not present

## 2015-07-13 DIAGNOSIS — J3081 Allergic rhinitis due to animal (cat) (dog) hair and dander: Secondary | ICD-10-CM | POA: Diagnosis not present

## 2015-07-13 DIAGNOSIS — E010 Iodine-deficiency related diffuse (endemic) goiter: Secondary | ICD-10-CM | POA: Diagnosis not present

## 2015-07-13 DIAGNOSIS — J3089 Other allergic rhinitis: Secondary | ICD-10-CM | POA: Diagnosis not present

## 2015-07-14 DIAGNOSIS — N3010 Interstitial cystitis (chronic) without hematuria: Secondary | ICD-10-CM | POA: Diagnosis not present

## 2015-07-14 DIAGNOSIS — J029 Acute pharyngitis, unspecified: Secondary | ICD-10-CM | POA: Diagnosis not present

## 2015-07-14 DIAGNOSIS — Z6841 Body Mass Index (BMI) 40.0 and over, adult: Secondary | ICD-10-CM | POA: Diagnosis not present

## 2015-07-14 DIAGNOSIS — F331 Major depressive disorder, recurrent, moderate: Secondary | ICD-10-CM | POA: Diagnosis not present

## 2015-07-14 DIAGNOSIS — R300 Dysuria: Secondary | ICD-10-CM | POA: Diagnosis not present

## 2015-07-14 DIAGNOSIS — F411 Generalized anxiety disorder: Secondary | ICD-10-CM | POA: Diagnosis not present

## 2015-07-25 DIAGNOSIS — F411 Generalized anxiety disorder: Secondary | ICD-10-CM | POA: Diagnosis not present

## 2015-07-25 DIAGNOSIS — F331 Major depressive disorder, recurrent, moderate: Secondary | ICD-10-CM | POA: Diagnosis not present

## 2015-07-26 DIAGNOSIS — N3010 Interstitial cystitis (chronic) without hematuria: Secondary | ICD-10-CM | POA: Diagnosis not present

## 2015-07-26 DIAGNOSIS — R3121 Asymptomatic microscopic hematuria: Secondary | ICD-10-CM | POA: Diagnosis not present

## 2015-07-26 DIAGNOSIS — R300 Dysuria: Secondary | ICD-10-CM | POA: Diagnosis not present

## 2015-07-27 DIAGNOSIS — J301 Allergic rhinitis due to pollen: Secondary | ICD-10-CM | POA: Diagnosis not present

## 2015-07-27 DIAGNOSIS — J3081 Allergic rhinitis due to animal (cat) (dog) hair and dander: Secondary | ICD-10-CM | POA: Diagnosis not present

## 2015-07-27 DIAGNOSIS — J3089 Other allergic rhinitis: Secondary | ICD-10-CM | POA: Diagnosis not present

## 2015-08-07 DIAGNOSIS — M940 Chondrocostal junction syndrome [Tietze]: Secondary | ICD-10-CM | POA: Diagnosis not present

## 2015-08-07 DIAGNOSIS — N83202 Unspecified ovarian cyst, left side: Secondary | ICD-10-CM | POA: Diagnosis not present

## 2015-08-07 DIAGNOSIS — Z6841 Body Mass Index (BMI) 40.0 and over, adult: Secondary | ICD-10-CM | POA: Diagnosis not present

## 2015-08-07 DIAGNOSIS — J019 Acute sinusitis, unspecified: Secondary | ICD-10-CM | POA: Diagnosis not present

## 2015-08-07 DIAGNOSIS — R110 Nausea: Secondary | ICD-10-CM | POA: Diagnosis not present

## 2015-08-08 DIAGNOSIS — M7661 Achilles tendinitis, right leg: Secondary | ICD-10-CM | POA: Diagnosis not present

## 2015-08-08 DIAGNOSIS — M79671 Pain in right foot: Secondary | ICD-10-CM | POA: Diagnosis not present

## 2015-08-09 DIAGNOSIS — R102 Pelvic and perineal pain: Secondary | ICD-10-CM | POA: Diagnosis not present

## 2015-08-09 DIAGNOSIS — R278 Other lack of coordination: Secondary | ICD-10-CM | POA: Diagnosis not present

## 2015-08-09 DIAGNOSIS — M62838 Other muscle spasm: Secondary | ICD-10-CM | POA: Diagnosis not present

## 2015-08-09 DIAGNOSIS — N3010 Interstitial cystitis (chronic) without hematuria: Secondary | ICD-10-CM | POA: Diagnosis not present

## 2015-08-10 DIAGNOSIS — N393 Stress incontinence (female) (male): Secondary | ICD-10-CM | POA: Diagnosis not present

## 2015-08-10 DIAGNOSIS — R278 Other lack of coordination: Secondary | ICD-10-CM | POA: Diagnosis not present

## 2015-08-10 DIAGNOSIS — R102 Pelvic and perineal pain: Secondary | ICD-10-CM | POA: Diagnosis not present

## 2015-08-10 DIAGNOSIS — J301 Allergic rhinitis due to pollen: Secondary | ICD-10-CM | POA: Diagnosis not present

## 2015-08-10 DIAGNOSIS — J3081 Allergic rhinitis due to animal (cat) (dog) hair and dander: Secondary | ICD-10-CM | POA: Diagnosis not present

## 2015-08-10 DIAGNOSIS — J3089 Other allergic rhinitis: Secondary | ICD-10-CM | POA: Diagnosis not present

## 2015-08-10 DIAGNOSIS — M62838 Other muscle spasm: Secondary | ICD-10-CM | POA: Diagnosis not present

## 2015-08-11 ENCOUNTER — Encounter: Payer: Medicare Other | Attending: Physical Medicine & Rehabilitation | Admitting: Registered Nurse

## 2015-08-11 ENCOUNTER — Encounter: Payer: Self-pay | Admitting: Registered Nurse

## 2015-08-11 ENCOUNTER — Telehealth: Payer: Self-pay | Admitting: Physical Medicine & Rehabilitation

## 2015-08-11 VITALS — BP 139/106 | HR 72

## 2015-08-11 DIAGNOSIS — F419 Anxiety disorder, unspecified: Secondary | ICD-10-CM

## 2015-08-11 DIAGNOSIS — M47816 Spondylosis without myelopathy or radiculopathy, lumbar region: Secondary | ICD-10-CM | POA: Diagnosis not present

## 2015-08-11 DIAGNOSIS — Z5181 Encounter for therapeutic drug level monitoring: Secondary | ICD-10-CM

## 2015-08-11 DIAGNOSIS — M79671 Pain in right foot: Secondary | ICD-10-CM | POA: Diagnosis not present

## 2015-08-11 DIAGNOSIS — E785 Hyperlipidemia, unspecified: Secondary | ICD-10-CM | POA: Diagnosis not present

## 2015-08-11 DIAGNOSIS — Z79899 Other long term (current) drug therapy: Secondary | ICD-10-CM

## 2015-08-11 DIAGNOSIS — N920 Excessive and frequent menstruation with regular cycle: Secondary | ICD-10-CM | POA: Diagnosis not present

## 2015-08-11 DIAGNOSIS — N393 Stress incontinence (female) (male): Secondary | ICD-10-CM | POA: Diagnosis not present

## 2015-08-11 DIAGNOSIS — G2581 Restless legs syndrome: Secondary | ICD-10-CM | POA: Diagnosis not present

## 2015-08-11 DIAGNOSIS — Z760 Encounter for issue of repeat prescription: Secondary | ICD-10-CM | POA: Diagnosis not present

## 2015-08-11 DIAGNOSIS — G43909 Migraine, unspecified, not intractable, without status migrainosus: Secondary | ICD-10-CM | POA: Insufficient documentation

## 2015-08-11 DIAGNOSIS — M791 Myalgia: Secondary | ICD-10-CM

## 2015-08-11 DIAGNOSIS — G8929 Other chronic pain: Secondary | ICD-10-CM | POA: Insufficient documentation

## 2015-08-11 DIAGNOSIS — G4733 Obstructive sleep apnea (adult) (pediatric): Secondary | ICD-10-CM | POA: Diagnosis not present

## 2015-08-11 DIAGNOSIS — M545 Low back pain: Secondary | ICD-10-CM | POA: Insufficient documentation

## 2015-08-11 DIAGNOSIS — Z87442 Personal history of urinary calculi: Secondary | ICD-10-CM | POA: Diagnosis not present

## 2015-08-11 DIAGNOSIS — IMO0001 Reserved for inherently not codable concepts without codable children: Secondary | ICD-10-CM

## 2015-08-11 DIAGNOSIS — F418 Other specified anxiety disorders: Secondary | ICD-10-CM | POA: Diagnosis not present

## 2015-08-11 DIAGNOSIS — M25551 Pain in right hip: Secondary | ICD-10-CM

## 2015-08-11 DIAGNOSIS — K219 Gastro-esophageal reflux disease without esophagitis: Secondary | ICD-10-CM | POA: Insufficient documentation

## 2015-08-11 DIAGNOSIS — M797 Fibromyalgia: Secondary | ICD-10-CM | POA: Diagnosis not present

## 2015-08-11 DIAGNOSIS — G43009 Migraine without aura, not intractable, without status migrainosus: Secondary | ICD-10-CM

## 2015-08-11 DIAGNOSIS — K589 Irritable bowel syndrome without diarrhea: Secondary | ICD-10-CM | POA: Diagnosis not present

## 2015-08-11 DIAGNOSIS — J45998 Other asthma: Secondary | ICD-10-CM | POA: Diagnosis not present

## 2015-08-11 DIAGNOSIS — M1990 Unspecified osteoarthritis, unspecified site: Secondary | ICD-10-CM | POA: Diagnosis not present

## 2015-08-11 DIAGNOSIS — E669 Obesity, unspecified: Secondary | ICD-10-CM | POA: Insufficient documentation

## 2015-08-11 DIAGNOSIS — N3010 Interstitial cystitis (chronic) without hematuria: Secondary | ICD-10-CM | POA: Diagnosis not present

## 2015-08-11 DIAGNOSIS — M609 Myositis, unspecified: Secondary | ICD-10-CM

## 2015-08-11 MED ORDER — HYDROCODONE-ACETAMINOPHEN 10-325 MG PO TABS: ORAL_TABLET | ORAL | Status: DC

## 2015-08-11 NOTE — Progress Notes (Signed)
Subjective:    Patient ID: Karina Greer, female    DOB: [birth year], 49 y.o.   MRN: [id number]  HPI: Karina Greer is a 49 year old female who returns for follow up for chronic pain and medication refill. She says her pain is located in her lower back, sacral pain, right hip, right leg and right foot pain. She rates her pain 7. Her current exercise regime is walking. Arrived hypertensive encouraged to keep a blood pressure log she verbalizes understanding.  Pain Inventory Average Pain 7 Pain Right Now 7 My pain is sharp, burning, stabbing, tingling and aching  In the last 24 hours, has pain interfered with the following? General activity 6 Relation with others 6 Enjoyment of life 7 What TIME of day is your pain at its worst? constant Sleep (in general) Fair  Pain is worse with: walking, bending, sitting, inactivity, standing and some activites Pain improves with: rest, heat/ice, therapy/exercise, pacing activities, medication and TENS Relief from Meds: 5  Mobility walk without assistance how many minutes can you walk? 5 ability to climb steps?  yes do you drive?  yes Do you have any goals in this area?  yes  Function disabled: date disabled . I need assistance with the following:  meal prep, household duties and shopping  Neuro/Psych bladder control problems weakness numbness tingling trouble walking spasms dizziness confusion depression anxiety  Prior Studies Any changes since last visit?  no  Physicians involved in your care Any changes since last visit?  no   Family History  Problem Relation Age of Onset  . Hypertension Mother   . Diabetes Father   . Fibromyalgia Sister   . Hypertension Other     Cancer, Cerebrovascular disease run on mother side of family   Social History   Social History  . Marital Status: Divorced    Spouse Name: N/A  . Number of Children: 3  . Years of Education: HS   Occupational History  . Unemployed    Disability   Social History Main Topics  . Smoking status: Never Smoker   . Smokeless tobacco: Never Used  . Alcohol Use: No  . Drug Use: No  . Sexual Activity: Not Asked   Other Topics Concern  . None   Social History Narrative   Past Surgical History  Procedure Laterality Date  . Laparoscopic ovarian cyst bx  2005    AND  URETEROSCOPIC LASER LITHO  STONE EXTRACTION  . Cysto/  urethral dilation/  hydrodistention/   instillation therapy  07-16-2010//   12-30-2007//   10-27-2006  . Transthoracic echocardiogram  06-07-2006    normal study/  ef 60-65%  . Exercise tolerence test  10-12-2010    NEGATIVE  ADEQUATE ETT/  NO ISCHEMIA OR EVIDENCE HIGH GRADE OBSTRUCTIVE CAD/  NO FURTHER TEST NEEDED  . Tubal ligation Bilateral 1995  . Shoulder open rotator cuff repair Right 2004  . Cholecystectomy  2014  . Hysteroscopy w/  novasure endometrial ablation  2014  . Cysto with hydrodistension N/A 01/26/2014    Procedure: CYSTOSCOPY/HYDRODISTENSION;  Surgeon: Bernestine Amass, MD;  Location: [facility];  Service: Urology;  Laterality: N/A;   Past Medical History  Diagnosis Date  . Fibromyalgia   . Depression   . Dyslipidemia   . Arthritis   . GERD (gastroesophageal reflux disease)   . Chronic low back pain   . History of renal calculi   . RLS (restless legs syndrome)   . Interstitial  cystitis   . IBS (irritable bowel syndrome)   . 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 PONV (postoperative nausea and vomiting)   . Anxiety disorder   . SI (sacroiliac) joint dysfunction   . SUI (stress urinary incontinence, female)   . Anxiety   . OSA on CPAP   . History of panic attacks   . Seasonal asthma   . Menorrhagia    BP 139/106 mmHg  Pulse 72  SpO2 94%  Opioid Risk Score:   Fall Risk Score:  `1  Depression screen PHQ 2/9  Depression screen [facility] 2/9 06/12/2015 04/17/2015 12/07/2014  Decreased Interest 3 3 3   Down, Depressed, Hopeless 3 2 2   PHQ - 2 Score 6 5  5   Altered sleeping - - 3  Tired, decreased energy - - 3  Change in appetite - - 3  Feeling bad or failure about yourself  - - 1  Trouble concentrating - - 3  Moving slowly or fidgety/restless - - 1  Suicidal thoughts - - 0  PHQ-9 Score - - 19      Review of Systems  Gastrointestinal: Positive for nausea.  Musculoskeletal: Positive for gait problem.  Neurological: Positive for weakness.  Psychiatric/Behavioral: Positive for dysphoric mood.  All other systems reviewed and are negative.      Objective:   Physical Exam  Constitutional: She is oriented to person, place, and time. She appears well-developed and well-nourished.  HENT:  Head: Normocephalic and atraumatic.  Neck: Normal range of motion. Neck supple.  Cardiovascular: Normal rate and regular rhythm.   Pulmonary/Chest: Effort normal and breath sounds normal.  Musculoskeletal:  Normal Muscle Bulk and Muscle Testing Reveals: Upper Extremities: Full ROM and Muscle Strength 5/5 Thoracic Paraspinal Tenderness: T-10- T-12 Lumbar Paraspinal Tenderness: L-3-L-5 Lower Extremities: Full ROM and Muscle Strength 5/5 Arises from chair with ease Narrow Based Gait   Neurological: She is alert and oriented to person, place, and time.  Skin: Skin is warm and dry.  Psychiatric: She has a normal mood and affect.  Nursing note and vitals reviewed.         Assessment & Plan:  1. Lumbago:  Refilled: HYDROcodone 10/325mg  one tablet every 6 hours as needed #120. Second script given for the following month. 2. Fibromyalgia. Continue Current exercise Regime  3. Anxiety and depression: Dr. Jordan Hawks Psychiatrist Following prescribed Klonopin. Continue Counseling at [facility] 4. Migraines: On Maxalt. Neurology Following  5. OSA : Continue exercise regime as tolerated and losing weight.  6. Obesity: Following Healthy Diet Regimen 7. Status Post Right Foot Surgery: Dr. Lorin Mercy Following  20 minutes of face to face patient  care time was spent during this visit. All questions were encouraged and answered.   F/U in 1 Month

## 2015-08-11 NOTE — Telephone Encounter (Signed)
Spoke with Ms. Fante she called to states her PCP Dr. Tobie Lords prescribed Tussinex and a Z-pak. Instructed not to take the Hydrocodone while on Tussinex she verbalizes understanding. Also states she took her blood pressure it was 138/94, encouraged to continue with blood pressure log and to follow up with her PCP she verbalizes understanding.

## 2015-08-11 NOTE — Telephone Encounter (Signed)
Patient wanted to let Zella Ball know she checked her blood pressure again and it was 138/93.

## 2015-08-14 ENCOUNTER — Telehealth: Payer: Self-pay

## 2015-08-14 NOTE — Telephone Encounter (Signed)
Patient called to make sure it was ok for her to get tussinex filled.  Patient says she spoke with Zella Ball regarding having syrup.  Per Zella Ball patient can take tussinex but not hydrocodone at the same time.

## 2015-08-15 DIAGNOSIS — R102 Pelvic and perineal pain: Secondary | ICD-10-CM | POA: Diagnosis not present

## 2015-08-15 DIAGNOSIS — N393 Stress incontinence (female) (male): Secondary | ICD-10-CM | POA: Diagnosis not present

## 2015-08-15 DIAGNOSIS — R278 Other lack of coordination: Secondary | ICD-10-CM | POA: Diagnosis not present

## 2015-08-15 DIAGNOSIS — M62838 Other muscle spasm: Secondary | ICD-10-CM | POA: Diagnosis not present

## 2015-08-16 DIAGNOSIS — R278 Other lack of coordination: Secondary | ICD-10-CM | POA: Diagnosis not present

## 2015-08-16 DIAGNOSIS — R102 Pelvic and perineal pain: Secondary | ICD-10-CM | POA: Diagnosis not present

## 2015-08-16 DIAGNOSIS — M62838 Other muscle spasm: Secondary | ICD-10-CM | POA: Diagnosis not present

## 2015-08-16 DIAGNOSIS — N393 Stress incontinence (female) (male): Secondary | ICD-10-CM | POA: Diagnosis not present

## 2015-08-21 DIAGNOSIS — M6281 Muscle weakness (generalized): Secondary | ICD-10-CM | POA: Diagnosis not present

## 2015-08-21 DIAGNOSIS — N393 Stress incontinence (female) (male): Secondary | ICD-10-CM | POA: Diagnosis not present

## 2015-08-21 DIAGNOSIS — R278 Other lack of coordination: Secondary | ICD-10-CM | POA: Diagnosis not present

## 2015-08-21 DIAGNOSIS — R102 Pelvic and perineal pain: Secondary | ICD-10-CM | POA: Diagnosis not present

## 2015-08-21 DIAGNOSIS — M62838 Other muscle spasm: Secondary | ICD-10-CM | POA: Diagnosis not present

## 2015-08-22 DIAGNOSIS — B9689 Other specified bacterial agents as the cause of diseases classified elsewhere: Secondary | ICD-10-CM | POA: Diagnosis not present

## 2015-08-22 DIAGNOSIS — J019 Acute sinusitis, unspecified: Secondary | ICD-10-CM | POA: Diagnosis not present

## 2015-08-28 DIAGNOSIS — J019 Acute sinusitis, unspecified: Secondary | ICD-10-CM | POA: Diagnosis not present

## 2015-08-28 DIAGNOSIS — E559 Vitamin D deficiency, unspecified: Secondary | ICD-10-CM | POA: Diagnosis not present

## 2015-08-28 DIAGNOSIS — E7800 Pure hypercholesterolemia, unspecified: Secondary | ICD-10-CM | POA: Diagnosis not present

## 2015-08-28 DIAGNOSIS — K219 Gastro-esophageal reflux disease without esophagitis: Secondary | ICD-10-CM | POA: Diagnosis not present

## 2015-08-28 DIAGNOSIS — Z79899 Other long term (current) drug therapy: Secondary | ICD-10-CM | POA: Diagnosis not present

## 2015-08-29 DIAGNOSIS — J3089 Other allergic rhinitis: Secondary | ICD-10-CM | POA: Diagnosis not present

## 2015-08-29 DIAGNOSIS — J3081 Allergic rhinitis due to animal (cat) (dog) hair and dander: Secondary | ICD-10-CM | POA: Diagnosis not present

## 2015-08-29 DIAGNOSIS — Z79899 Other long term (current) drug therapy: Secondary | ICD-10-CM | POA: Diagnosis not present

## 2015-08-29 DIAGNOSIS — E7800 Pure hypercholesterolemia, unspecified: Secondary | ICD-10-CM | POA: Diagnosis not present

## 2015-08-29 DIAGNOSIS — J301 Allergic rhinitis due to pollen: Secondary | ICD-10-CM | POA: Diagnosis not present

## 2015-08-29 DIAGNOSIS — E559 Vitamin D deficiency, unspecified: Secondary | ICD-10-CM | POA: Diagnosis not present

## 2015-09-01 DIAGNOSIS — R1032 Left lower quadrant pain: Secondary | ICD-10-CM | POA: Diagnosis not present

## 2015-09-01 DIAGNOSIS — R300 Dysuria: Secondary | ICD-10-CM | POA: Diagnosis not present

## 2015-09-01 DIAGNOSIS — Z113 Encounter for screening for infections with a predominantly sexual mode of transmission: Secondary | ICD-10-CM | POA: Diagnosis not present

## 2015-09-14 DIAGNOSIS — F331 Major depressive disorder, recurrent, moderate: Secondary | ICD-10-CM | POA: Diagnosis not present

## 2015-09-14 DIAGNOSIS — J301 Allergic rhinitis due to pollen: Secondary | ICD-10-CM | POA: Diagnosis not present

## 2015-09-14 DIAGNOSIS — F411 Generalized anxiety disorder: Secondary | ICD-10-CM | POA: Diagnosis not present

## 2015-09-14 DIAGNOSIS — J3081 Allergic rhinitis due to animal (cat) (dog) hair and dander: Secondary | ICD-10-CM | POA: Diagnosis not present

## 2015-09-14 DIAGNOSIS — J3089 Other allergic rhinitis: Secondary | ICD-10-CM | POA: Diagnosis not present

## 2015-09-14 DIAGNOSIS — Z1231 Encounter for screening mammogram for malignant neoplasm of breast: Secondary | ICD-10-CM | POA: Diagnosis not present

## 2015-09-15 DIAGNOSIS — J3089 Other allergic rhinitis: Secondary | ICD-10-CM | POA: Diagnosis not present

## 2015-09-15 DIAGNOSIS — J301 Allergic rhinitis due to pollen: Secondary | ICD-10-CM | POA: Diagnosis not present

## 2015-09-15 DIAGNOSIS — J3081 Allergic rhinitis due to animal (cat) (dog) hair and dander: Secondary | ICD-10-CM | POA: Diagnosis not present

## 2015-09-21 DIAGNOSIS — Z6841 Body Mass Index (BMI) 40.0 and over, adult: Secondary | ICD-10-CM | POA: Diagnosis not present

## 2015-09-21 DIAGNOSIS — M25532 Pain in left wrist: Secondary | ICD-10-CM | POA: Diagnosis not present

## 2015-09-29 DIAGNOSIS — N3000 Acute cystitis without hematuria: Secondary | ICD-10-CM | POA: Diagnosis not present

## 2015-09-29 DIAGNOSIS — R1032 Left lower quadrant pain: Secondary | ICD-10-CM | POA: Diagnosis not present

## 2015-10-05 DIAGNOSIS — J3081 Allergic rhinitis due to animal (cat) (dog) hair and dander: Secondary | ICD-10-CM | POA: Diagnosis not present

## 2015-10-05 DIAGNOSIS — J301 Allergic rhinitis due to pollen: Secondary | ICD-10-CM | POA: Diagnosis not present

## 2015-10-05 DIAGNOSIS — J3089 Other allergic rhinitis: Secondary | ICD-10-CM | POA: Diagnosis not present

## 2015-10-06 DIAGNOSIS — N63 Unspecified lump in breast: Secondary | ICD-10-CM | POA: Diagnosis not present

## 2015-10-06 DIAGNOSIS — R928 Other abnormal and inconclusive findings on diagnostic imaging of breast: Secondary | ICD-10-CM | POA: Diagnosis not present

## 2015-10-17 ENCOUNTER — Encounter: Payer: Self-pay | Admitting: Registered Nurse

## 2015-10-17 ENCOUNTER — Encounter: Payer: Medicare Other | Attending: Physical Medicine & Rehabilitation | Admitting: Registered Nurse

## 2015-10-17 VITALS — BP 133/91 | HR 87 | Resp 14

## 2015-10-17 DIAGNOSIS — F32A Depression, unspecified: Secondary | ICD-10-CM

## 2015-10-17 DIAGNOSIS — M791 Myalgia: Secondary | ICD-10-CM | POA: Diagnosis not present

## 2015-10-17 DIAGNOSIS — Z87442 Personal history of urinary calculi: Secondary | ICD-10-CM | POA: Insufficient documentation

## 2015-10-17 DIAGNOSIS — E785 Hyperlipidemia, unspecified: Secondary | ICD-10-CM | POA: Insufficient documentation

## 2015-10-17 DIAGNOSIS — M47816 Spondylosis without myelopathy or radiculopathy, lumbar region: Secondary | ICD-10-CM | POA: Diagnosis not present

## 2015-10-17 DIAGNOSIS — K219 Gastro-esophageal reflux disease without esophagitis: Secondary | ICD-10-CM | POA: Insufficient documentation

## 2015-10-17 DIAGNOSIS — G4733 Obstructive sleep apnea (adult) (pediatric): Secondary | ICD-10-CM | POA: Insufficient documentation

## 2015-10-17 DIAGNOSIS — M609 Myositis, unspecified: Secondary | ICD-10-CM

## 2015-10-17 DIAGNOSIS — G8929 Other chronic pain: Secondary | ICD-10-CM | POA: Insufficient documentation

## 2015-10-17 DIAGNOSIS — F418 Other specified anxiety disorders: Secondary | ICD-10-CM | POA: Diagnosis not present

## 2015-10-17 DIAGNOSIS — Z760 Encounter for issue of repeat prescription: Secondary | ICD-10-CM | POA: Diagnosis not present

## 2015-10-17 DIAGNOSIS — G2581 Restless legs syndrome: Secondary | ICD-10-CM | POA: Diagnosis not present

## 2015-10-17 DIAGNOSIS — G43909 Migraine, unspecified, not intractable, without status migrainosus: Secondary | ICD-10-CM | POA: Insufficient documentation

## 2015-10-17 DIAGNOSIS — Z79899 Other long term (current) drug therapy: Secondary | ICD-10-CM

## 2015-10-17 DIAGNOSIS — K589 Irritable bowel syndrome without diarrhea: Secondary | ICD-10-CM | POA: Diagnosis not present

## 2015-10-17 DIAGNOSIS — N393 Stress incontinence (female) (male): Secondary | ICD-10-CM | POA: Insufficient documentation

## 2015-10-17 DIAGNOSIS — N3010 Interstitial cystitis (chronic) without hematuria: Secondary | ICD-10-CM | POA: Insufficient documentation

## 2015-10-17 DIAGNOSIS — M545 Low back pain: Secondary | ICD-10-CM | POA: Insufficient documentation

## 2015-10-17 DIAGNOSIS — IMO0001 Reserved for inherently not codable concepts without codable children: Secondary | ICD-10-CM

## 2015-10-17 DIAGNOSIS — M25551 Pain in right hip: Secondary | ICD-10-CM

## 2015-10-17 DIAGNOSIS — M797 Fibromyalgia: Secondary | ICD-10-CM | POA: Diagnosis not present

## 2015-10-17 DIAGNOSIS — J45998 Other asthma: Secondary | ICD-10-CM | POA: Insufficient documentation

## 2015-10-17 DIAGNOSIS — M1990 Unspecified osteoarthritis, unspecified site: Secondary | ICD-10-CM | POA: Diagnosis not present

## 2015-10-17 DIAGNOSIS — Z5181 Encounter for therapeutic drug level monitoring: Secondary | ICD-10-CM | POA: Diagnosis not present

## 2015-10-17 DIAGNOSIS — E669 Obesity, unspecified: Secondary | ICD-10-CM | POA: Insufficient documentation

## 2015-10-17 DIAGNOSIS — N920 Excessive and frequent menstruation with regular cycle: Secondary | ICD-10-CM | POA: Insufficient documentation

## 2015-10-17 DIAGNOSIS — M79671 Pain in right foot: Secondary | ICD-10-CM | POA: Diagnosis not present

## 2015-10-17 DIAGNOSIS — F419 Anxiety disorder, unspecified: Secondary | ICD-10-CM

## 2015-10-17 DIAGNOSIS — G43009 Migraine without aura, not intractable, without status migrainosus: Secondary | ICD-10-CM

## 2015-10-17 DIAGNOSIS — F329 Major depressive disorder, single episode, unspecified: Secondary | ICD-10-CM

## 2015-10-17 MED ORDER — HYDROCODONE-ACETAMINOPHEN 10-325 MG PO TABS: ORAL_TABLET | ORAL | Status: DC

## 2015-10-17 NOTE — Progress Notes (Signed)
Subjective:    Patient ID: Karina Greer, female    DOB: Apr 16, 1966, 50 y.o.   MRN: [id number]  HPI: Ms. SAAVI LAWTON is a 50 year old female who returns for follow up for chronic pain and medication refill. She says her pain is located in her mid- lower back and right hip. She rates her pain 6. Her current exercise regime is walking and performing stretching exercises using bands.  Pain Inventory Average Pain 6 Pain Right Now 6 My pain is constant, sharp, burning, dull, stabbing and aching  In the last 24 hours, has pain interfered with the following? General activity 5 Relation with others 10 Enjoyment of life 10 What TIME of day is your pain at its worst? all Sleep (in general) Fair  Pain is worse with: walking, bending, sitting, inactivity, standing and some activites Pain improves with: rest, heat/ice, therapy/exercise, pacing activities, medication and TENS Relief from Meds: 6  Mobility walk without assistance how many minutes can you walk? 0 ability to climb steps?  yes do you drive?  yes  Function disabled: date disabled . I need assistance with the following:  meal prep, household duties and shopping Do you have any goals in this area?  no  Neuro/Psych No problems in this area  Prior Studies Any changes since last visit?  yes bone scan CT/MRI  Physicians involved in your care Any changes since last visit?  no   Family History  Problem Relation Age of Onset  . Hypertension Mother   . Diabetes Father   . Fibromyalgia Sister   . Hypertension Other     Cancer, Cerebrovascular disease run on mother side of family   Social History   Social History  . Marital Status: Divorced    Spouse Name: N/A  . Number of Children: 3  . Years of Education: HS   Occupational History  . Unemployed     Disability   Social History Main Topics  . Smoking status: Never Smoker   . Smokeless tobacco: Never Used  . Alcohol Use: No  . Drug Use: No  . Sexual  Activity: Not Asked   Other Topics Concern  . None   Social History Narrative   Past Surgical History  Procedure Laterality Date  . Laparoscopic ovarian cyst bx  2005    AND  URETEROSCOPIC LASER LITHO  STONE EXTRACTION  . Cysto/  urethral dilation/  hydrodistention/   instillation therapy  07-16-2010//   12-30-2007//   10-27-2006  . Transthoracic echocardiogram  06-07-2006    normal study/  ef 60-65%  . Exercise tolerence test  10-12-2010    NEGATIVE  ADEQUATE ETT/  NO ISCHEMIA OR EVIDENCE HIGH GRADE OBSTRUCTIVE CAD/  NO FURTHER TEST NEEDED  . Tubal ligation Bilateral 1995  . Shoulder open rotator cuff repair Right 2004  . Cholecystectomy  2014  . Hysteroscopy w/  novasure endometrial ablation  2014  . Cysto with hydrodistension N/A 01/26/2014    Procedure: CYSTOSCOPY/HYDRODISTENSION;  Surgeon: Bernestine Amass, MD;  Location: [facility];  Service: Urology;  Laterality: N/A;   Past Medical History  Diagnosis Date  . Fibromyalgia   . Depression   . Dyslipidemia   . Arthritis   . GERD (gastroesophageal reflux disease)   . Chronic low back pain   . History of renal calculi   . RLS (restless legs syndrome)   . Interstitial cystitis   . IBS (irritable bowel syndrome)   . White matter abnormality  on MRI of brain 02/23/2013  . PONV (postoperative nausea and vomiting)   . Anxiety disorder   . SI (sacroiliac) joint dysfunction   . SUI (stress urinary incontinence, female)   . Anxiety   . OSA on CPAP   . History of panic attacks   . Seasonal asthma   . Menorrhagia    BP 133/91 mmHg  Pulse 87  Resp 14  SpO2 97%  Opioid Risk Score:   Fall Risk Score:  `1  Depression screen PHQ 2/9  Depression screen [facility] 2/9 06/12/2015 04/17/2015 12/07/2014  Decreased Interest 3 3 3   Down, Depressed, Hopeless 3 2 2   PHQ - 2 Score 6 5 5   Altered sleeping - - 3  Tired, decreased energy - - 3  Change in appetite - - 3  Feeling bad or failure about yourself  - - 1  Trouble  concentrating - - 3  Moving slowly or fidgety/restless - - 1  Suicidal thoughts - - 0  PHQ-9 Score - - 19     Review of Systems  Constitutional: Positive for appetite change.  Respiratory: Positive for apnea.   Gastrointestinal: Positive for abdominal pain.  All other systems reviewed and are negative.      Objective:   Physical Exam  Constitutional: She is oriented to person, place, and time. She appears well-developed and well-nourished.  HENT:  Head: Normocephalic and atraumatic.  Neck: Normal range of motion. Neck supple.  Cardiovascular: Normal rate and regular rhythm.   Pulmonary/Chest: Effort normal and breath sounds normal.  Musculoskeletal:  Normal Muscle Bulk and Muscle Testing Reveals: Upper Extremities: Full ROM and Muscle Strength 5/5 Lumbar Paraspinal Tenderness: L-3- L-5 Mainly Right Side Right Greater Trochanteric Tenderness Lower Extremities: Full ROM and Muscle strength 5/5 Right Lower Extremity Flexion Produces Pain into Right Hip Arises from chair with ease Narrow Based Gait     Neurological: She is alert and oriented to person, place, and time.  Skin: Skin is warm and dry.  Psychiatric: She has a normal mood and affect.  Nursing note and vitals reviewed.         Assessment & Plan:  1. Lumbago:  Refilled: HYDROcodone 10/325mg  one tablet every 6 hours as needed #120. Second script given for the following month. 2. Fibromyalgia. Continue Current exercise Regime  3. Anxiety and depression: Dr. Jordan Hawks Psychiatrist Following prescribed Xanax. Continue Counseling at [facility] 4. Migraines: On Maxalt. Neurology Following  5. OSA : Continue exercise regime as tolerated and losing weight.  6. Obesity: Following Healthy Diet Regimen 7. Status Post Right Foot Surgery: Dr. Lorin Mercy Following  20 minutes of face to face patient care time was spent during this visit. All questions were encouraged and answered.   F/U in 1 Month

## 2015-10-23 DIAGNOSIS — F411 Generalized anxiety disorder: Secondary | ICD-10-CM | POA: Diagnosis not present

## 2015-10-23 DIAGNOSIS — F331 Major depressive disorder, recurrent, moderate: Secondary | ICD-10-CM | POA: Diagnosis not present

## 2015-10-24 LAB — TOXASSURE SELECT,+ANTIDEPR,UR: PDF: 0

## 2015-10-24 NOTE — Progress Notes (Signed)
Urine drug screen for this encounter is consistent for prescribed medication 

## 2015-10-30 DIAGNOSIS — J069 Acute upper respiratory infection, unspecified: Secondary | ICD-10-CM | POA: Diagnosis not present

## 2015-10-30 DIAGNOSIS — J301 Allergic rhinitis due to pollen: Secondary | ICD-10-CM | POA: Diagnosis not present

## 2015-10-30 DIAGNOSIS — G4733 Obstructive sleep apnea (adult) (pediatric): Secondary | ICD-10-CM | POA: Diagnosis not present

## 2015-10-30 DIAGNOSIS — J3089 Other allergic rhinitis: Secondary | ICD-10-CM | POA: Diagnosis not present

## 2015-11-08 DIAGNOSIS — M542 Cervicalgia: Secondary | ICD-10-CM | POA: Diagnosis not present

## 2015-11-14 DIAGNOSIS — F331 Major depressive disorder, recurrent, moderate: Secondary | ICD-10-CM | POA: Diagnosis not present

## 2015-11-14 DIAGNOSIS — F411 Generalized anxiety disorder: Secondary | ICD-10-CM | POA: Diagnosis not present

## 2015-11-15 DIAGNOSIS — R202 Paresthesia of skin: Secondary | ICD-10-CM | POA: Diagnosis not present

## 2015-11-22 DIAGNOSIS — E7800 Pure hypercholesterolemia, unspecified: Secondary | ICD-10-CM | POA: Diagnosis not present

## 2015-11-22 DIAGNOSIS — M79643 Pain in unspecified hand: Secondary | ICD-10-CM | POA: Diagnosis not present

## 2015-11-22 DIAGNOSIS — E559 Vitamin D deficiency, unspecified: Secondary | ICD-10-CM | POA: Diagnosis not present

## 2015-11-22 DIAGNOSIS — Z6841 Body Mass Index (BMI) 40.0 and over, adult: Secondary | ICD-10-CM | POA: Diagnosis not present

## 2015-11-22 DIAGNOSIS — G43919 Migraine, unspecified, intractable, without status migrainosus: Secondary | ICD-10-CM | POA: Diagnosis not present

## 2015-11-24 ENCOUNTER — Other Ambulatory Visit: Payer: Self-pay | Admitting: Orthopaedic Surgery

## 2015-11-24 DIAGNOSIS — M542 Cervicalgia: Secondary | ICD-10-CM | POA: Diagnosis not present

## 2015-11-24 DIAGNOSIS — R202 Paresthesia of skin: Secondary | ICD-10-CM | POA: Diagnosis not present

## 2015-11-27 DIAGNOSIS — J3089 Other allergic rhinitis: Secondary | ICD-10-CM | POA: Diagnosis not present

## 2015-11-27 DIAGNOSIS — J301 Allergic rhinitis due to pollen: Secondary | ICD-10-CM | POA: Diagnosis not present

## 2015-11-29 DIAGNOSIS — I7300 Raynaud's syndrome without gangrene: Secondary | ICD-10-CM | POA: Insufficient documentation

## 2015-11-29 DIAGNOSIS — M79641 Pain in right hand: Secondary | ICD-10-CM | POA: Insufficient documentation

## 2015-11-29 DIAGNOSIS — M797 Fibromyalgia: Secondary | ICD-10-CM | POA: Diagnosis not present

## 2015-11-29 DIAGNOSIS — R768 Other specified abnormal immunological findings in serum: Secondary | ICD-10-CM | POA: Insufficient documentation

## 2015-11-29 DIAGNOSIS — M79642 Pain in left hand: Secondary | ICD-10-CM | POA: Diagnosis not present

## 2015-11-29 DIAGNOSIS — M12842 Other specific arthropathies, not elsewhere classified, left hand: Secondary | ICD-10-CM | POA: Diagnosis not present

## 2015-11-29 DIAGNOSIS — G44221 Chronic tension-type headache, intractable: Secondary | ICD-10-CM | POA: Diagnosis not present

## 2015-11-29 DIAGNOSIS — M12841 Other specific arthropathies, not elsewhere classified, right hand: Secondary | ICD-10-CM | POA: Diagnosis not present

## 2015-11-29 DIAGNOSIS — R7982 Elevated C-reactive protein (CRP): Secondary | ICD-10-CM | POA: Insufficient documentation

## 2015-12-03 ENCOUNTER — Ambulatory Visit
Admission: RE | Admit: 2015-12-03 | Discharge: 2015-12-03 | Disposition: A | Discharge status: Home / Self Care | Payer: Medicare Other | Source: Ambulatory Visit | Attending: Orthopaedic Surgery | Admitting: Orthopaedic Surgery

## 2015-12-03 DIAGNOSIS — M542 Cervicalgia: Secondary | ICD-10-CM

## 2015-12-03 DIAGNOSIS — M5023 Other cervical disc displacement, cervicothoracic region: Secondary | ICD-10-CM | POA: Diagnosis not present

## 2015-12-12 ENCOUNTER — Encounter: Payer: Self-pay | Admitting: Physical Medicine & Rehabilitation

## 2015-12-12 ENCOUNTER — Encounter: Payer: Medicare Other | Attending: Physical Medicine & Rehabilitation | Admitting: Physical Medicine & Rehabilitation

## 2015-12-12 VITALS — BP 143/101 | HR 85

## 2015-12-12 DIAGNOSIS — K219 Gastro-esophageal reflux disease without esophagitis: Secondary | ICD-10-CM | POA: Diagnosis not present

## 2015-12-12 DIAGNOSIS — G4733 Obstructive sleep apnea (adult) (pediatric): Secondary | ICD-10-CM | POA: Insufficient documentation

## 2015-12-12 DIAGNOSIS — K589 Irritable bowel syndrome without diarrhea: Secondary | ICD-10-CM | POA: Insufficient documentation

## 2015-12-12 DIAGNOSIS — M791 Myalgia: Secondary | ICD-10-CM | POA: Diagnosis not present

## 2015-12-12 DIAGNOSIS — G43009 Migraine without aura, not intractable, without status migrainosus: Secondary | ICD-10-CM | POA: Diagnosis not present

## 2015-12-12 DIAGNOSIS — J45998 Other asthma: Secondary | ICD-10-CM | POA: Insufficient documentation

## 2015-12-12 DIAGNOSIS — F418 Other specified anxiety disorders: Secondary | ICD-10-CM | POA: Diagnosis not present

## 2015-12-12 DIAGNOSIS — M609 Myositis, unspecified: Secondary | ICD-10-CM

## 2015-12-12 DIAGNOSIS — G8929 Other chronic pain: Secondary | ICD-10-CM | POA: Diagnosis not present

## 2015-12-12 DIAGNOSIS — G43909 Migraine, unspecified, not intractable, without status migrainosus: Secondary | ICD-10-CM | POA: Insufficient documentation

## 2015-12-12 DIAGNOSIS — N920 Excessive and frequent menstruation with regular cycle: Secondary | ICD-10-CM | POA: Insufficient documentation

## 2015-12-12 DIAGNOSIS — E785 Hyperlipidemia, unspecified: Secondary | ICD-10-CM | POA: Diagnosis not present

## 2015-12-12 DIAGNOSIS — Z87442 Personal history of urinary calculi: Secondary | ICD-10-CM | POA: Insufficient documentation

## 2015-12-12 DIAGNOSIS — N393 Stress incontinence (female) (male): Secondary | ICD-10-CM | POA: Diagnosis not present

## 2015-12-12 DIAGNOSIS — M1990 Unspecified osteoarthritis, unspecified site: Secondary | ICD-10-CM | POA: Diagnosis not present

## 2015-12-12 DIAGNOSIS — E669 Obesity, unspecified: Secondary | ICD-10-CM | POA: Diagnosis not present

## 2015-12-12 DIAGNOSIS — F419 Anxiety disorder, unspecified: Secondary | ICD-10-CM

## 2015-12-12 DIAGNOSIS — N3010 Interstitial cystitis (chronic) without hematuria: Secondary | ICD-10-CM | POA: Insufficient documentation

## 2015-12-12 DIAGNOSIS — M47816 Spondylosis without myelopathy or radiculopathy, lumbar region: Secondary | ICD-10-CM | POA: Diagnosis not present

## 2015-12-12 DIAGNOSIS — J3089 Other allergic rhinitis: Secondary | ICD-10-CM | POA: Diagnosis not present

## 2015-12-12 DIAGNOSIS — M79671 Pain in right foot: Secondary | ICD-10-CM | POA: Diagnosis not present

## 2015-12-12 DIAGNOSIS — M797 Fibromyalgia: Secondary | ICD-10-CM | POA: Diagnosis not present

## 2015-12-12 DIAGNOSIS — M545 Low back pain: Secondary | ICD-10-CM | POA: Diagnosis not present

## 2015-12-12 DIAGNOSIS — G2581 Restless legs syndrome: Secondary | ICD-10-CM | POA: Insufficient documentation

## 2015-12-12 DIAGNOSIS — Z760 Encounter for issue of repeat prescription: Secondary | ICD-10-CM | POA: Diagnosis not present

## 2015-12-12 DIAGNOSIS — J301 Allergic rhinitis due to pollen: Secondary | ICD-10-CM | POA: Diagnosis not present

## 2015-12-12 DIAGNOSIS — M25551 Pain in right hip: Secondary | ICD-10-CM | POA: Insufficient documentation

## 2015-12-12 DIAGNOSIS — IMO0001 Reserved for inherently not codable concepts without codable children: Secondary | ICD-10-CM

## 2015-12-12 MED ORDER — HYDROCODONE-ACETAMINOPHEN 10-325 MG PO TABS: ORAL_TABLET | ORAL | Status: DC

## 2015-12-12 NOTE — Patient Instructions (Signed)
YOU NEED TO COLLECT YOUR IMAGES AND LABWORK FROM RHEUMATOLOGIST TO BRING WITH YOU TO FUTURE APPOINTMENT

## 2015-12-12 NOTE — Progress Notes (Signed)
Subjective:    Patient ID: Karina Greer, female    DOB: [birth year], 50 y.o.   MRN: [id number]  HPI   Karina Greer is here in follow up of her chronic pain. Her sister committed suicide last week by overdosing on her psychiatric medications.  It has been a struggle for Karina Greer since then in coping with the loss.  She has an appointment with her counselor scheduled for next week.   Prior to this she had been starting at the [facility] and working on the equipment and in the water, too.  She has seen a rheumatologist at [facility] who found elevated CRP, ESR, and rheumatoid factor and advised she start on methotrexate. Apparently xrays were normal per patient. She was unhappy with the recommendations and would like another opinion.   She continues on her other medications as prescribed.     Pain Inventory Average Pain 8 Pain Right Now 9 My pain is constant, stabbing and aching  In the last 24 hours, has pain interfered with the following? General activity 9 Relation with others 9 Enjoyment of life 9 What TIME of day is your pain at its worst? all times Sleep (in general) NA  Pain is worse with: walking, bending, sitting, inactivity, standing and some activites Pain improves with: rest, heat/ice, therapy/exercise, pacing activities, medication and TENS Relief from Meds: 7  Mobility walk without assistance how many minutes can you walk? 53mns ability to climb steps?  yes do you drive?  yes Do you have any goals in this area?  yes  Function disabled: date disabled .  Neuro/Psych bladder control problems weakness numbness tingling trouble walking dizziness confusion depression anxiety loss of taste or smell  Prior Studies Any changes since last visit?  no  Physicians involved in your care Any changes since last visit?  no   Family History  Problem Relation Age of Onset  . Hypertension Mother   . Diabetes Father   . Fibromyalgia Sister   . Hypertension Other     Cancer,  Cerebrovascular disease run on mother side of family   Social History   Social History  . Marital Status: Divorced    Spouse Name: N/A  . Number of Children: 3  . Years of Education: HS   Occupational History  . Unemployed     Disability   Social History Main Topics  . Smoking status: Never Smoker   . Smokeless tobacco: Never Used  . Alcohol Use: No  . Drug Use: No  . Sexual Activity: Not Asked   Other Topics Concern  . None   Social History Narrative   Past Surgical History  Procedure Laterality Date  . Laparoscopic ovarian cyst bx  2005    AND  URETEROSCOPIC LASER LITHO  STONE EXTRACTION  . Cysto/  urethral dilation/  hydrodistention/   instillation therapy  07-16-2010//   12-30-2007//   10-27-2006  . Transthoracic echocardiogram  06-07-2006    normal study/  ef 60-65%  . Exercise tolerence test  10-12-2010    NEGATIVE  ADEQUATE ETT/  NO ISCHEMIA OR EVIDENCE HIGH GRADE OBSTRUCTIVE CAD/  NO FURTHER TEST NEEDED  . Tubal ligation Bilateral 1995  . Shoulder open rotator cuff repair Right 2004  . Cholecystectomy  2014  . Hysteroscopy w/  novasure endometrial ablation  2014  . Cysto with hydrodistension N/A 01/26/2014    Procedure: CYSTOSCOPY/HYDRODISTENSION;  Surgeon: DBernestine Amass MD;  [facility]  Service: Urology;  Laterality: N/A;  Past Medical History  Diagnosis Date  . Fibromyalgia   . Depression   . Dyslipidemia   . Arthritis   . GERD (gastroesophageal reflux disease)   . Chronic low back pain   . History of renal calculi   . RLS (restless legs syndrome)   . Interstitial cystitis   . IBS (irritable bowel syndrome)   . 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 PONV (postoperative nausea and vomiting)   . Anxiety disorder   . SI (sacroiliac) joint dysfunction   . SUI (stress urinary incontinence, female)   . Anxiety   . OSA on CPAP   . History of panic attacks   . Seasonal asthma   . Menorrhagia    There were no  vitals taken for this visit.  Opioid Risk Score:   Fall Risk Score:  `1  Depression screen PHQ 2/9  Depression screen [facility] 2/9 06/12/2015 04/17/2015 12/07/2014  Decreased Interest _0 Down, Depressed, Hopeless _1 PHQ - 2 Score _2 Altered sleeping - - 3  Tired, decreased energy - - 3  Change in appetite - - 3  Feeling bad or failure about yourself  - - 1  Trouble concentrating - - 3  Moving slowly or fidgety/restless - - 1  Suicidal thoughts - - 0  PHQ-9 Score - - 19     Review of Systems     Objective:   Physical Exam   Constitutional: She is oriented to person, place, and time. She appears well-developed and well-nourished.  She remains overweight but hasn't gained further  HENT:  Head: Normocephalic and atraumatic.  Eyes: Conjunctivae and EOM are normal. Pupils are equal, round, and reactive to light.  Neck: Normal range of motion.  Cardiovascular: Normal rate and regular rhythm.  Pulmonary/Chest: Effort normal and breath sounds normal.  Abdominal: Bowel sounds are normal.  Musculoskeletal:  Lumbar back: Right lumbar paraspinals are taut. Slight elevation of the right hemipelvis as well.  Back rom fair. Neurological: She is alert and oriented to person, place, and time. She has normal strength. No cranial nerve deficit or sensory deficit. She displays a negative Romberg sign. STM and attentional deficits  Psych: surprisingly calm given the recent events. Good attention and awareness today   Assessment & Plan:   ASSESSMENT:  1. Fibromyalgia.  2. Anxiety and depression.  3. History of low back pain. Mild facet disease on MRI and exam is consistent  4. OSA  5. 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 (neurology is following)  6. Recent suicide of sister 36. 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 Refilled hydrocodone today and for next month.  2. Pt will seek another opinion  regarding her autoimmune disorder/elevated labs. Asked her to collect labwork and images.  3. Grief counseling offered --will go through behavioral health for now 4. CPAP per pulmonology.  5. Provided supportive counseling regarding her sister.   6. Follow up in two months with PA. Fifteen minutes of face to face patient care time were spent during this visit. All questions were encouraged and answered.

## 2015-12-13 DIAGNOSIS — J019 Acute sinusitis, unspecified: Secondary | ICD-10-CM | POA: Diagnosis not present

## 2015-12-13 DIAGNOSIS — L719 Rosacea, unspecified: Secondary | ICD-10-CM | POA: Diagnosis not present

## 2015-12-13 DIAGNOSIS — R768 Other specified abnormal immunological findings in serum: Secondary | ICD-10-CM | POA: Diagnosis not present

## 2015-12-15 DIAGNOSIS — F331 Major depressive disorder, recurrent, moderate: Secondary | ICD-10-CM | POA: Diagnosis not present

## 2015-12-15 DIAGNOSIS — F411 Generalized anxiety disorder: Secondary | ICD-10-CM | POA: Diagnosis not present

## 2015-12-15 DIAGNOSIS — M542 Cervicalgia: Secondary | ICD-10-CM | POA: Diagnosis not present

## 2015-12-18 DIAGNOSIS — Z6841 Body Mass Index (BMI) 40.0 and over, adult: Secondary | ICD-10-CM | POA: Diagnosis not present

## 2015-12-18 DIAGNOSIS — J019 Acute sinusitis, unspecified: Secondary | ICD-10-CM | POA: Diagnosis not present

## 2015-12-26 DIAGNOSIS — J0101 Acute recurrent maxillary sinusitis: Secondary | ICD-10-CM | POA: Diagnosis not present

## 2015-12-26 DIAGNOSIS — J301 Allergic rhinitis due to pollen: Secondary | ICD-10-CM | POA: Diagnosis not present

## 2015-12-26 DIAGNOSIS — J3081 Allergic rhinitis due to animal (cat) (dog) hair and dander: Secondary | ICD-10-CM | POA: Diagnosis not present

## 2015-12-26 DIAGNOSIS — J3089 Other allergic rhinitis: Secondary | ICD-10-CM | POA: Diagnosis not present

## 2015-12-26 DIAGNOSIS — R1310 Dysphagia, unspecified: Secondary | ICD-10-CM | POA: Diagnosis not present

## 2015-12-27 ENCOUNTER — Other Ambulatory Visit: Payer: Self-pay | Admitting: Internal Medicine

## 2015-12-27 DIAGNOSIS — L719 Rosacea, unspecified: Secondary | ICD-10-CM | POA: Diagnosis not present

## 2015-12-27 DIAGNOSIS — B079 Viral wart, unspecified: Secondary | ICD-10-CM | POA: Diagnosis not present

## 2015-12-27 DIAGNOSIS — R1310 Dysphagia, unspecified: Secondary | ICD-10-CM

## 2015-12-29 ENCOUNTER — Telehealth: Payer: Self-pay

## 2015-12-29 NOTE — Telephone Encounter (Signed)
Pt called requesting permission to pick up some cough syrup from her PCP. She can not recall the name, but she knows it has codeine. Please advise.

## 2015-12-29 NOTE — Telephone Encounter (Signed)
It's ok. She needs to let us know which one. thanks

## 2015-12-29 NOTE — Telephone Encounter (Signed)
Pt advised. She states it is Tussinex.

## 2016-01-03 ENCOUNTER — Ambulatory Visit
Admission: RE | Admit: 2016-01-03 | Discharge: 2016-01-03 | Disposition: A | Discharge status: Home / Self Care | Payer: Medicare Other | Source: Ambulatory Visit | Attending: Internal Medicine | Admitting: Internal Medicine

## 2016-01-03 DIAGNOSIS — K219 Gastro-esophageal reflux disease without esophagitis: Secondary | ICD-10-CM | POA: Diagnosis not present

## 2016-01-03 DIAGNOSIS — R1310 Dysphagia, unspecified: Secondary | ICD-10-CM | POA: Insufficient documentation

## 2016-01-08 DIAGNOSIS — J301 Allergic rhinitis due to pollen: Secondary | ICD-10-CM | POA: Diagnosis not present

## 2016-01-08 DIAGNOSIS — J3081 Allergic rhinitis due to animal (cat) (dog) hair and dander: Secondary | ICD-10-CM | POA: Diagnosis not present

## 2016-01-08 DIAGNOSIS — F411 Generalized anxiety disorder: Secondary | ICD-10-CM | POA: Diagnosis not present

## 2016-01-08 DIAGNOSIS — J3089 Other allergic rhinitis: Secondary | ICD-10-CM | POA: Diagnosis not present

## 2016-01-08 DIAGNOSIS — F331 Major depressive disorder, recurrent, moderate: Secondary | ICD-10-CM | POA: Diagnosis not present

## 2016-01-09 DIAGNOSIS — J3089 Other allergic rhinitis: Secondary | ICD-10-CM | POA: Diagnosis not present

## 2016-01-09 DIAGNOSIS — J3081 Allergic rhinitis due to animal (cat) (dog) hair and dander: Secondary | ICD-10-CM | POA: Diagnosis not present

## 2016-01-09 DIAGNOSIS — R0602 Shortness of breath: Secondary | ICD-10-CM | POA: Diagnosis not present

## 2016-01-09 DIAGNOSIS — J301 Allergic rhinitis due to pollen: Secondary | ICD-10-CM | POA: Diagnosis not present

## 2016-01-09 DIAGNOSIS — K219 Gastro-esophageal reflux disease without esophagitis: Secondary | ICD-10-CM | POA: Diagnosis not present

## 2016-01-15 DIAGNOSIS — M542 Cervicalgia: Secondary | ICD-10-CM | POA: Diagnosis not present

## 2016-01-16 DIAGNOSIS — M542 Cervicalgia: Secondary | ICD-10-CM | POA: Diagnosis not present

## 2016-01-19 DIAGNOSIS — N3010 Interstitial cystitis (chronic) without hematuria: Secondary | ICD-10-CM | POA: Diagnosis not present

## 2016-01-19 DIAGNOSIS — R030 Elevated blood-pressure reading, without diagnosis of hypertension: Secondary | ICD-10-CM | POA: Diagnosis not present

## 2016-01-19 DIAGNOSIS — N764 Abscess of vulva: Secondary | ICD-10-CM | POA: Diagnosis not present

## 2016-01-19 DIAGNOSIS — M542 Cervicalgia: Secondary | ICD-10-CM | POA: Diagnosis not present

## 2016-01-24 ENCOUNTER — Telehealth: Payer: Self-pay | Admitting: *Deleted

## 2016-01-24 DIAGNOSIS — J3081 Allergic rhinitis due to animal (cat) (dog) hair and dander: Secondary | ICD-10-CM | POA: Diagnosis not present

## 2016-01-24 DIAGNOSIS — J301 Allergic rhinitis due to pollen: Secondary | ICD-10-CM | POA: Diagnosis not present

## 2016-01-24 DIAGNOSIS — J3089 Other allergic rhinitis: Secondary | ICD-10-CM | POA: Diagnosis not present

## 2016-01-24 DIAGNOSIS — M542 Cervicalgia: Secondary | ICD-10-CM | POA: Diagnosis not present

## 2016-01-24 NOTE — Telephone Encounter (Signed)
Pharmacy did override and medication can be filled.  I have spoken to Karina Greer to please get the information from the pharmacy as to where the rejection is coming from.  They pharmacy)are not articulating where the breakdown is either.The only cards she has is medicare and medicaid.

## 2016-01-24 NOTE — Telephone Encounter (Signed)
Message from credentialing:  I have checked the CMS website, and it says that to be eligible as a prescribing provider with Medicare, the provider must be enrolled with their local MAC, which for [place] is [place]. I checked, and Dr. Naaman Plummer is currently active with [facility] and not due for any revalidation at this time.   Please have the pharmacy tell us which plan is denying the RX.

## 2016-01-24 NOTE — Telephone Encounter (Signed)
Elgene called back this morning about the pharmacy problem. I let Leianna know Arletta Bale is working on the issue.

## 2016-01-24 NOTE — Telephone Encounter (Signed)
Calling because they could not fill Karina Greer's hydrocodone because Dr Naaman Plummer is coming up not credentialed with her medicare. Please advise

## 2016-02-02 DIAGNOSIS — I10 Essential (primary) hypertension: Secondary | ICD-10-CM | POA: Diagnosis not present

## 2016-02-02 DIAGNOSIS — R109 Unspecified abdominal pain: Secondary | ICD-10-CM | POA: Diagnosis not present

## 2016-02-02 DIAGNOSIS — M5090 Cervical disc disorder, unspecified, unspecified cervical region: Secondary | ICD-10-CM | POA: Diagnosis not present

## 2016-02-02 DIAGNOSIS — E7800 Pure hypercholesterolemia, unspecified: Secondary | ICD-10-CM | POA: Diagnosis not present

## 2016-02-02 DIAGNOSIS — E559 Vitamin D deficiency, unspecified: Secondary | ICD-10-CM | POA: Diagnosis not present

## 2016-02-02 DIAGNOSIS — Z6841 Body Mass Index (BMI) 40.0 and over, adult: Secondary | ICD-10-CM | POA: Diagnosis not present

## 2016-02-05 DIAGNOSIS — F411 Generalized anxiety disorder: Secondary | ICD-10-CM | POA: Diagnosis not present

## 2016-02-05 DIAGNOSIS — F331 Major depressive disorder, recurrent, moderate: Secondary | ICD-10-CM | POA: Diagnosis not present

## 2016-02-06 DIAGNOSIS — M542 Cervicalgia: Secondary | ICD-10-CM | POA: Diagnosis not present

## 2016-02-08 DIAGNOSIS — F411 Generalized anxiety disorder: Secondary | ICD-10-CM | POA: Diagnosis not present

## 2016-02-08 DIAGNOSIS — M542 Cervicalgia: Secondary | ICD-10-CM | POA: Diagnosis not present

## 2016-02-08 DIAGNOSIS — F331 Major depressive disorder, recurrent, moderate: Secondary | ICD-10-CM | POA: Diagnosis not present

## 2016-02-12 ENCOUNTER — Encounter: Payer: Medicare Other | Attending: Physical Medicine & Rehabilitation | Admitting: Physical Medicine & Rehabilitation

## 2016-02-12 ENCOUNTER — Encounter: Payer: Self-pay | Admitting: Physical Medicine & Rehabilitation

## 2016-02-12 VITALS — BP 142/88 | HR 71 | Resp 17

## 2016-02-12 DIAGNOSIS — G4733 Obstructive sleep apnea (adult) (pediatric): Secondary | ICD-10-CM | POA: Insufficient documentation

## 2016-02-12 DIAGNOSIS — N920 Excessive and frequent menstruation with regular cycle: Secondary | ICD-10-CM | POA: Insufficient documentation

## 2016-02-12 DIAGNOSIS — M1990 Unspecified osteoarthritis, unspecified site: Secondary | ICD-10-CM | POA: Insufficient documentation

## 2016-02-12 DIAGNOSIS — G8929 Other chronic pain: Secondary | ICD-10-CM | POA: Insufficient documentation

## 2016-02-12 DIAGNOSIS — N393 Stress incontinence (female) (male): Secondary | ICD-10-CM | POA: Diagnosis not present

## 2016-02-12 DIAGNOSIS — M25551 Pain in right hip: Secondary | ICD-10-CM | POA: Insufficient documentation

## 2016-02-12 DIAGNOSIS — M545 Low back pain: Secondary | ICD-10-CM | POA: Diagnosis not present

## 2016-02-12 DIAGNOSIS — M79671 Pain in right foot: Secondary | ICD-10-CM | POA: Diagnosis not present

## 2016-02-12 DIAGNOSIS — Z79899 Other long term (current) drug therapy: Secondary | ICD-10-CM | POA: Diagnosis not present

## 2016-02-12 DIAGNOSIS — F418 Other specified anxiety disorders: Secondary | ICD-10-CM | POA: Insufficient documentation

## 2016-02-12 DIAGNOSIS — N3010 Interstitial cystitis (chronic) without hematuria: Secondary | ICD-10-CM | POA: Insufficient documentation

## 2016-02-12 DIAGNOSIS — M542 Cervicalgia: Secondary | ICD-10-CM | POA: Diagnosis not present

## 2016-02-12 DIAGNOSIS — J45998 Other asthma: Secondary | ICD-10-CM | POA: Diagnosis not present

## 2016-02-12 DIAGNOSIS — G894 Chronic pain syndrome: Secondary | ICD-10-CM

## 2016-02-12 DIAGNOSIS — Z87442 Personal history of urinary calculi: Secondary | ICD-10-CM | POA: Diagnosis not present

## 2016-02-12 DIAGNOSIS — IMO0001 Reserved for inherently not codable concepts without codable children: Secondary | ICD-10-CM

## 2016-02-12 DIAGNOSIS — M609 Myositis, unspecified: Secondary | ICD-10-CM

## 2016-02-12 DIAGNOSIS — G43009 Migraine without aura, not intractable, without status migrainosus: Secondary | ICD-10-CM | POA: Diagnosis not present

## 2016-02-12 DIAGNOSIS — Z760 Encounter for issue of repeat prescription: Secondary | ICD-10-CM | POA: Diagnosis not present

## 2016-02-12 DIAGNOSIS — M47816 Spondylosis without myelopathy or radiculopathy, lumbar region: Secondary | ICD-10-CM

## 2016-02-12 DIAGNOSIS — M791 Myalgia: Secondary | ICD-10-CM | POA: Diagnosis not present

## 2016-02-12 DIAGNOSIS — M797 Fibromyalgia: Secondary | ICD-10-CM | POA: Diagnosis not present

## 2016-02-12 DIAGNOSIS — K219 Gastro-esophageal reflux disease without esophagitis: Secondary | ICD-10-CM | POA: Diagnosis not present

## 2016-02-12 DIAGNOSIS — G43909 Migraine, unspecified, not intractable, without status migrainosus: Secondary | ICD-10-CM | POA: Diagnosis not present

## 2016-02-12 DIAGNOSIS — E669 Obesity, unspecified: Secondary | ICD-10-CM | POA: Diagnosis not present

## 2016-02-12 DIAGNOSIS — Z5181 Encounter for therapeutic drug level monitoring: Secondary | ICD-10-CM

## 2016-02-12 DIAGNOSIS — G2581 Restless legs syndrome: Secondary | ICD-10-CM | POA: Diagnosis not present

## 2016-02-12 DIAGNOSIS — F419 Anxiety disorder, unspecified: Secondary | ICD-10-CM

## 2016-02-12 DIAGNOSIS — K589 Irritable bowel syndrome without diarrhea: Secondary | ICD-10-CM | POA: Insufficient documentation

## 2016-02-12 DIAGNOSIS — E785 Hyperlipidemia, unspecified: Secondary | ICD-10-CM | POA: Diagnosis not present

## 2016-02-12 MED ORDER — HYDROCODONE-ACETAMINOPHEN 10-325 MG PO TABS: ORAL_TABLET | ORAL | Status: DC

## 2016-02-12 NOTE — Progress Notes (Signed)
Subjective:    Patient ID: Karina Greer, female    DOB: [birth year], 50 y.o.   MRN: [id number]  HPI   Karina Greer is here in follow up of her chronic pain. She is just now getting over the loss of her sister although she still struggles with it emotionally still. Her ex husband is now sick which has added to the "emotional load."  She is going to the [facility] for counseling and psychiatric rx which has helped.   She has had increased neck pain with radiation and numbness in the arms. There Cervical MRI revealed:   C5-C6: Broad-based central disc protrusion indents the ventral thecal sac, abutting the cervical spinal cord with mild cord flattening. No cord signal changes. Cephalad migration of disc material posterior to the C5 vertebral body (series 4, image 6). Mild to moderate canal stenosis with mild bilateral foraminal narrowing. This could potentially effect the transiting C6 nerve roots bilaterally.  C6-C7: Diffuse degenerative disc bulge, slightly eccentric to the left. Superimposed bilateral uncovertebral hypertrophy. The bulging disc flattens and partially faces the ventral thecal sac with resultant mild canal stenosis. No significant foraminal narrowing.  C7-T1: Shallow broad-based posterior disc protrusion minimally indents the ventral thecal sac without significant stenosis. Bulging disc slightly eccentric to the left. Foramina widely patent.  Apparently Dr. Lorin Mercy will be doing surgery on her soon----perhaps a C5-6 discectomy?     Pain Inventory Average Pain 7 Pain Right Now 9 My pain is constant, sharp, burning, stabbing and tingling  In the last 24 hours, has pain interfered with the following? General activity 6 Relation with others 6 Enjoyment of life 6 What TIME of day is your pain at its worst? All the time Sleep (in general) Fair  Pain is worse with: walking, bending, sitting, inactivity, standing and some activites Pain improves with: rest,  heat/ice, therapy/exercise, pacing activities, medication and TENS Relief from Meds: 6  Mobility walk without assistance how many minutes can you walk? 15 ability to climb steps?  yes do you drive?  yes Do you have any goals in this area?  yes  Function disabled: date disabled NA I need assistance with the following:  meal prep, household duties and shopping Do you have any goals in this area?  yes  Neuro/Psych bladder control problems weakness numbness tingling spasms dizziness confusion depression anxiety  Prior Studies Any changes since last visit?  yes  Physicians involved in your care Any changes since last visit?  yes   Family History  Problem Relation Age of Onset  . Hypertension Mother   . Diabetes Father   . Fibromyalgia Sister   . Hypertension Other     Cancer, Cerebrovascular disease run on mother side of family   Social History   Social History  . Marital Status: Divorced    Spouse Name: N/A  . Number of Children: 3  . Years of Education: HS   Occupational History  . Unemployed     Disability   Social History Main Topics  . Smoking status: Never Smoker   . Smokeless tobacco: Never Used  . Alcohol Use: No  . Drug Use: No  . Sexual Activity: Not Asked   Other Topics Concern  . None   Social History Narrative   Past Surgical History  Procedure Laterality Date  . Laparoscopic ovarian cyst bx  2005    AND  URETEROSCOPIC LASER LITHO  STONE EXTRACTION  . Cysto/  urethral dilation/  hydrodistention/   instillation therapy  07-16-2010//   12-30-2007//   10-27-2006  . Transthoracic echocardiogram  06-07-2006    normal study/  ef 60-65%  . Exercise tolerence test  10-12-2010    NEGATIVE  ADEQUATE ETT/  NO ISCHEMIA OR EVIDENCE HIGH GRADE OBSTRUCTIVE CAD/  NO FURTHER TEST NEEDED  . Tubal ligation Bilateral 1995  . Shoulder open rotator cuff repair Right 2004  . Cholecystectomy  2014  . Hysteroscopy w/  novasure endometrial ablation  2014  .  Cysto with hydrodistension N/A 01/26/2014    Procedure: CYSTOSCOPY/HYDRODISTENSION;  Surgeon: Bernestine Amass, MD;  Location: [facility];  Service: Urology;  Laterality: N/A;   Past Medical History  Diagnosis Date  . Fibromyalgia   . Depression   . Dyslipidemia   . Arthritis   . GERD (gastroesophageal reflux disease)   . Chronic low back pain   . History of renal calculi   . RLS (restless legs syndrome)   . Interstitial cystitis   . IBS (irritable bowel syndrome)   . 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 PONV (postoperative nausea and vomiting)   . Anxiety disorder   . SI (sacroiliac) joint dysfunction   . SUI (stress urinary incontinence, female)   . Anxiety   . OSA on CPAP   . History of panic attacks   . Seasonal asthma   . Menorrhagia    BP 142/88 mmHg  Pulse 71  Resp 17  SpO2 97%  Opioid Risk Score:   Fall Risk Score:  `1  Depression screen PHQ 2/9  Depression screen [facility] 2/9 12/12/2015 06/12/2015 04/17/2015 12/07/2014  Decreased Interest _0 Down, Depressed, Hopeless _1 PHQ - 2 Score _2 Altered sleeping 2 - - 3  Tired, decreased energy 3 - - 3  Change in appetite 3 - - 3  Feeling bad or failure about yourself  3 - - 1  Trouble concentrating 3 - - 3  Moving slowly or fidgety/restless 0 - - 1  Suicidal thoughts 0 - - 0  PHQ-9 Score 18 - - 19  Difficult doing work/chores Very difficult - - -      Review of Systems  Constitutional: Positive for appetite change.  Respiratory: Positive for apnea.   Gastrointestinal: Positive for nausea and abdominal pain.  Genitourinary:       Bladder Control Problems  Neurological: Positive for dizziness, weakness and numbness.       Tingling Spasms  Psychiatric/Behavioral: Positive for confusion.       Depression Anxiety  All other systems reviewed and are negative.      Objective:   Physical Exam  Constitutional: She is oriented to person, place, and time. She appears  well-developed and well-nourished.  She remains overweight but hasn't gained further  HENT:  Head: Normocephalic and atraumatic.  Eyes: Conjunctivae and EOM are normal. Pupils are equal, round, and reactive to light.  Neck: Normal range of motion.  Cardiovascular: Normal rate and regular rhythm.  Pulmonary/Chest: Effort normal and breath sounds normal.  Abdominal: Bowel sounds are normal.  Musculoskeletal:  Lumbar back: Right lumbar paraspinals are taut. Slight elevation of the right hemipelvis as well.  Back rom fair. Neurological: She is alert and oriented to person, place, and time. She has normal strength. No cranial nerve deficit or sensory deficit. She displays a negative Romberg sign. STM and attentional deficits  Psych: reasonable awareness today.     Assessment & Plan:  ASSESSMENT:  1. Fibromyalgia.  2. Anxiety and depression.  3. History of low back pain. Mild facet disease on MRI and exam is consistent  4. OSA  5. 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 (neurology is following)  6. Recent suicide of sister  70. ?autoimmune disorder with elevated CRP, ESR, and RF per paperwork she brought      PLAN:  1. Refilled hydrocodone today and for next month.  2. GI work up per primary. I have to think a lot of her GI symptoms are related to stress,FMS,IBS 3. Continue psychology/psychiatry rx per [facility] 4. CPAP per pulmonology.  5. Provided counseling today regarding the impact of mood upon her pain. Discussed constructive ways to channel her emotions and to distract her from pain. Pt expressed an understanding and was appreciative. 6. Follow up in two months with NP. Fifteen minutes of face to face patient care time were spent during this visit. All questions were encouraged and answered.

## 2016-02-12 NOTE — Addendum Note (Signed)
Addended by: Gerald Leitz A on: 02/12/2016 02:59 PM   Modules accepted: Orders

## 2016-02-12 NOTE — Patient Instructions (Signed)
  PLEASE CALL ME WITH ANY PROBLEMS OR QUESTIONS (#[phone]).      

## 2016-02-13 DIAGNOSIS — M542 Cervicalgia: Secondary | ICD-10-CM | POA: Diagnosis not present

## 2016-02-15 DIAGNOSIS — J3081 Allergic rhinitis due to animal (cat) (dog) hair and dander: Secondary | ICD-10-CM | POA: Diagnosis not present

## 2016-02-15 DIAGNOSIS — J301 Allergic rhinitis due to pollen: Secondary | ICD-10-CM | POA: Diagnosis not present

## 2016-02-15 DIAGNOSIS — J3089 Other allergic rhinitis: Secondary | ICD-10-CM | POA: Diagnosis not present

## 2016-02-17 LAB — TOXASSURE SELECT,+ANTIDEPR,UR

## 2016-02-20 DIAGNOSIS — F331 Major depressive disorder, recurrent, moderate: Secondary | ICD-10-CM | POA: Diagnosis not present

## 2016-02-20 DIAGNOSIS — F411 Generalized anxiety disorder: Secondary | ICD-10-CM | POA: Diagnosis not present

## 2016-02-23 DIAGNOSIS — R768 Other specified abnormal immunological findings in serum: Secondary | ICD-10-CM | POA: Diagnosis not present

## 2016-02-23 NOTE — Progress Notes (Signed)
Urine drug screen for this encounter is consistent for prescribed medication 

## 2016-02-26 DIAGNOSIS — M2550 Pain in unspecified joint: Secondary | ICD-10-CM | POA: Diagnosis not present

## 2016-02-26 DIAGNOSIS — R768 Other specified abnormal immunological findings in serum: Secondary | ICD-10-CM | POA: Diagnosis not present

## 2016-02-26 DIAGNOSIS — M7989 Other specified soft tissue disorders: Secondary | ICD-10-CM | POA: Diagnosis not present

## 2016-02-29 DIAGNOSIS — J3089 Other allergic rhinitis: Secondary | ICD-10-CM | POA: Diagnosis not present

## 2016-02-29 DIAGNOSIS — J301 Allergic rhinitis due to pollen: Secondary | ICD-10-CM | POA: Diagnosis not present

## 2016-02-29 DIAGNOSIS — J3081 Allergic rhinitis due to animal (cat) (dog) hair and dander: Secondary | ICD-10-CM | POA: Diagnosis not present

## 2016-03-01 ENCOUNTER — Ambulatory Visit (HOSPITAL_COMMUNITY)
Admission: RE | Admit: 2016-03-01 | Discharge: 2016-03-01 | Disposition: A | Discharge status: Home / Self Care | Payer: Medicare Other | Source: Ambulatory Visit | Attending: Surgery | Admitting: Surgery

## 2016-03-01 ENCOUNTER — Encounter (HOSPITAL_COMMUNITY)
Admission: RE | Admit: 2016-03-01 | Discharge: 2016-03-01 | Disposition: A | Discharge status: Home / Self Care | Payer: Medicare Other | Source: Ambulatory Visit | Attending: Orthopaedic Surgery | Admitting: Orthopaedic Surgery

## 2016-03-01 ENCOUNTER — Encounter (HOSPITAL_COMMUNITY): Payer: Self-pay

## 2016-03-01 DIAGNOSIS — Z01810 Encounter for preprocedural cardiovascular examination: Secondary | ICD-10-CM | POA: Insufficient documentation

## 2016-03-01 DIAGNOSIS — Z01818 Encounter for other preprocedural examination: Secondary | ICD-10-CM | POA: Diagnosis not present

## 2016-03-01 DIAGNOSIS — Z01812 Encounter for preprocedural laboratory examination: Secondary | ICD-10-CM | POA: Insufficient documentation

## 2016-03-01 HISTORY — DX: Urinary tract infection, site not specified: N39.0

## 2016-03-01 HISTORY — DX: Systemic lupus erythematosus, unspecified: M32.9

## 2016-03-01 HISTORY — DX: Headache, unspecified: R51.9

## 2016-03-01 HISTORY — DX: Headache: R51

## 2016-03-01 HISTORY — DX: Personal history of urinary calculi: Z87.442

## 2016-03-01 HISTORY — DX: Reserved for concepts with insufficient information to code with codable children: IMO0002

## 2016-03-01 LAB — COMPREHENSIVE METABOLIC PANEL
ALT: 32 U/L (ref 14–54)
AST: 24 U/L (ref 15–41)
Albumin: 3.3 g/dL — ABNORMAL LOW (ref 3.5–5.0)
Alkaline Phosphatase: 69 U/L (ref 38–126)
Anion gap: 4 — ABNORMAL LOW (ref 5–15)
BUN: 8 mg/dL (ref 6–20)
CO2: 28 mmol/L (ref 22–32)
Calcium: 9.2 mg/dL (ref 8.9–10.3)
Chloride: 108 mmol/L (ref 101–111)
Creatinine, Ser: 0.89 mg/dL (ref 0.44–1.00)
GFR calc Af Amer: >60 mL/min (ref >60)
GFR calc non Af Amer: >60 mL/min (ref >60)
Glucose, Bld: 104 mg/dL — ABNORMAL HIGH (ref 65–99)
Potassium: 3.8 mmol/L (ref 3.5–5.1)
Sodium: 140 mmol/L (ref 135–145)
Total Bilirubin: 0.5 mg/dL (ref 0.3–1.2)
Total Protein: 6.6 g/dL (ref 6.5–8.1)

## 2016-03-01 LAB — URINALYSIS, ROUTINE W REFLEX MICROSCOPIC
Bilirubin Urine: NEGATIVE
Glucose, UA: NEGATIVE mg/dL
Hgb urine dipstick: NEGATIVE
Ketones, ur: NEGATIVE mg/dL
Leukocytes, UA: NEGATIVE
Nitrite: NEGATIVE
Protein, ur: NEGATIVE mg/dL
Specific Gravity, Urine: 1.024 (ref 1.005–1.030)
pH: 5 (ref 5.0–8.0)

## 2016-03-01 LAB — CBC
HCT: 41.6 % (ref 36.0–46.0)
Hemoglobin: 14.2 g/dL (ref 12.0–15.0)
MCH: 28.8 pg (ref 26.0–34.0)
MCHC: 34.1 g/dL (ref 30.0–36.0)
MCV: 84.4 fL (ref 78.0–100.0)
Platelets: 309 10*3/uL (ref 150–400)
RBC: 4.93 MIL/uL (ref 3.87–5.11)
RDW: 12.4 % (ref 11.5–15.5)
WBC: 8.7 10*3/uL (ref 4.0–10.5)

## 2016-03-01 LAB — SURGICAL PCR SCREEN
MRSA, PCR: NEGATIVE
Staphylococcus aureus: NEGATIVE

## 2016-03-01 LAB — PROTIME-INR
INR: 1.32 (ref 0.00–1.49)
Prothrombin Time: 16.5 seconds — ABNORMAL HIGH (ref 11.6–15.2)

## 2016-03-01 LAB — APTT: aPTT: 32 seconds (ref 24–37)

## 2016-03-01 LAB — HCG, SERUM, QUALITATIVE: Preg, Serum: NEGATIVE

## 2016-03-01 NOTE — Pre-Procedure Instructions (Addendum)
Karina Greer  03/01/2016      [id number] [place] [place] [address] [place] [address] Phone: [phone] Fax: [phone]    Your procedure is scheduled on 03/11/16  Report to [facility] Admitting at 1030 A.M.  Call this number if you have problems the morning of surgery:  [phone]   Remember:  Do not eat food or drink liquids after midnight.  Take these medicines the morning of surgery with A SIP OF WATER inhalers if needed(bring albuterol),dexilant, hydrocodone if needed,singulair,   STOP all herbel meds, nsaids (aleve,naproxen,advil,ibuprofen) 5 days prior to surgery including vitamins,aspirin   Do not wear jewelry, make-up or nail polish.  Do not wear lotions, powders, or perfumes.  You may wear deodorant.  Do not shave 48 hours prior to surgery.  Men may shave face and neck.  Do not bring valuables to the hospital.  [facility] is not responsible for any belongings or valuables.  Contacts, dentures or bridgework may not be worn into surgery.  Leave your suitcase in the car.  After surgery it may be brought to your room.  For patients admitted to the hospital, discharge time will be determined by your treatment team.  Patients discharged the day of surgery will not be allowed to drive home.   Name and phone number of your driver:   Special instructions:   Special Instructions: [facility] - Preparing for Surgery  Before surgery, you can play an important role.  Because skin is not sterile, your skin needs to be as free of germs as possible.  You can reduce the number of germs on you skin by washing with CHG (chlorahexidine gluconate) soap before surgery.  CHG is an antiseptic cleaner which kills germs and bonds with the skin to continue killing germs even after washing.  Please DO NOT use if you have an allergy to CHG or antibacterial soaps.  If your skin becomes  reddened/irritated stop using the CHG and inform your nurse when you arrive at Short Stay.  Do not shave (including legs and underarms) for at least 48 hours prior to the first CHG shower.  You may shave your face.  Please follow these instructions carefully:   1.  Shower with CHG Soap the night before surgery and the morning of Surgery.  2.  If you choose to wash your hair, wash your hair first as usual with your normal shampoo.  3.  After you shampoo, rinse your hair and body thoroughly to remove the Shampoo.  4.  Use CHG as you would any other liquid soap.  You can apply chg directly  to the skin and wash gently with scrungie or a clean washcloth.  5.  Apply the CHG Soap to your body ONLY FROM THE NECK DOWN.  Do not use on open wounds or open sores.  Avoid contact with your eyes ears, mouth and genitals (private parts).  Wash genitals (private parts)       with your normal soap.  6.  Wash thoroughly, paying special attention to the area where your surgery will be performed.  7.  Thoroughly rinse your body with warm water from the neck down.  8.  DO NOT shower/wash with your normal soap after using and rinsing off the CHG Soap.  9.  Pat yourself dry with a clean towel.  10.  Wear clean pajamas.            11.  Place clean sheets on your bed the night of your first shower and do not sleep with pets.  Day of Surgery  Do not apply any lotions/deodorants the morning of surgery.  Please wear clean clothes to the hospital/surgery center.  Please read over the following fact sheets that you were given. Pain Booklet

## 2016-03-11 ENCOUNTER — Encounter (HOSPITAL_COMMUNITY): Payer: Self-pay | Admitting: *Deleted

## 2016-03-11 ENCOUNTER — Inpatient Hospital Stay (HOSPITAL_COMMUNITY): Payer: Medicare Other | Admitting: Certified Registered Nurse Anesthetist

## 2016-03-11 ENCOUNTER — Encounter (HOSPITAL_COMMUNITY)
Admission: RE | Disposition: A | Discharge status: Home / Self Care | Payer: Self-pay | Source: Ambulatory Visit | Attending: Orthopaedic Surgery

## 2016-03-11 ENCOUNTER — Observation Stay (HOSPITAL_COMMUNITY)
Admission: RE | Admit: 2016-03-11 | Discharge: 2016-03-12 | Disposition: A | Discharge status: Home / Self Care | Payer: Medicare Other | Source: Ambulatory Visit | Attending: Orthopaedic Surgery | Admitting: Orthopaedic Surgery

## 2016-03-11 ENCOUNTER — Inpatient Hospital Stay (HOSPITAL_COMMUNITY): Payer: Medicare Other

## 2016-03-11 DIAGNOSIS — G43909 Migraine, unspecified, not intractable, without status migrainosus: Secondary | ICD-10-CM | POA: Diagnosis not present

## 2016-03-11 DIAGNOSIS — Z79899 Other long term (current) drug therapy: Secondary | ICD-10-CM | POA: Insufficient documentation

## 2016-03-11 DIAGNOSIS — Z981 Arthrodesis status: Secondary | ICD-10-CM | POA: Diagnosis not present

## 2016-03-11 DIAGNOSIS — M5020 Other cervical disc displacement, unspecified cervical region: Secondary | ICD-10-CM | POA: Diagnosis not present

## 2016-03-11 DIAGNOSIS — M329 Systemic lupus erythematosus, unspecified: Secondary | ICD-10-CM | POA: Diagnosis not present

## 2016-03-11 DIAGNOSIS — F418 Other specified anxiety disorders: Secondary | ICD-10-CM | POA: Diagnosis not present

## 2016-03-11 DIAGNOSIS — K219 Gastro-esophageal reflux disease without esophagitis: Secondary | ICD-10-CM | POA: Diagnosis not present

## 2016-03-11 DIAGNOSIS — G8929 Other chronic pain: Secondary | ICD-10-CM | POA: Insufficient documentation

## 2016-03-11 DIAGNOSIS — G4733 Obstructive sleep apnea (adult) (pediatric): Secondary | ICD-10-CM | POA: Insufficient documentation

## 2016-03-11 DIAGNOSIS — M545 Low back pain: Secondary | ICD-10-CM | POA: Diagnosis not present

## 2016-03-11 DIAGNOSIS — M47812 Spondylosis without myelopathy or radiculopathy, cervical region: Secondary | ICD-10-CM | POA: Diagnosis not present

## 2016-03-11 DIAGNOSIS — M50222 Other cervical disc displacement at C5-C6 level: Secondary | ICD-10-CM | POA: Diagnosis not present

## 2016-03-11 DIAGNOSIS — M4712 Other spondylosis with myelopathy, cervical region: Principal | ICD-10-CM | POA: Insufficient documentation

## 2016-03-11 DIAGNOSIS — J45998 Other asthma: Secondary | ICD-10-CM | POA: Diagnosis not present

## 2016-03-11 DIAGNOSIS — M50022 Cervical disc disorder at C5-C6 level with myelopathy: Secondary | ICD-10-CM | POA: Diagnosis not present

## 2016-03-11 DIAGNOSIS — Z419 Encounter for procedure for purposes other than remedying health state, unspecified: Secondary | ICD-10-CM

## 2016-03-11 HISTORY — PX: ANTERIOR CERVICAL DECOMP/DISCECTOMY FUSION: SHX1161

## 2016-03-11 SURGERY — ANTERIOR CERVICAL DECOMPRESSION/DISCECTOMY FUSION 2 LEVELS
Anesthesia: General

## 2016-03-11 MED ORDER — PROMETHAZINE HCL 25 MG/ML IJ SOLN: 6.2500 mg | INTRAMUSCULAR | Status: DC | PRN

## 2016-03-11 MED ORDER — CEFAZOLIN SODIUM-DEXTROSE 2-4 GM/100ML-% IV SOLN
2.0000 g | INTRAVENOUS | Status: AC
  Administered 2016-03-11: 2 g via INTRAVENOUS

## 2016-03-11 MED ORDER — MECLIZINE HCL 25 MG PO TABS
25.0000 mg | ORAL_TABLET | Freq: Three times a day (TID) | ORAL | Status: DC | PRN
  Filled 2016-03-11: qty 1

## 2016-03-11 MED ORDER — BUPIVACAINE-EPINEPHRINE (PF) 0.25% -1:200000 IJ SOLN
INTRAMUSCULAR | Status: AC
  Filled 2016-03-11: qty 30

## 2016-03-11 MED ORDER — CEFAZOLIN SODIUM-DEXTROSE 2-4 GM/100ML-% IV SOLN
INTRAVENOUS | Status: AC
  Filled 2016-03-11: qty 100

## 2016-03-11 MED ORDER — ROCURONIUM BROMIDE 100 MG/10ML IV SOLN
INTRAVENOUS | Status: DC | PRN
  Administered 2016-03-11: 50 mg via INTRAVENOUS

## 2016-03-11 MED ORDER — FENTANYL CITRATE (PF) 250 MCG/5ML IJ SOLN
INTRAMUSCULAR | Status: AC
  Filled 2016-03-11: qty 5

## 2016-03-11 MED ORDER — POLYETHYLENE GLYCOL 3350 17 G PO PACK: 17.0000 g | PACK | Freq: Every day | ORAL | Status: DC | PRN

## 2016-03-11 MED ORDER — GLYCOPYRROLATE 0.2 MG/ML IJ SOLN
INTRAMUSCULAR | Status: DC | PRN
  Administered 2016-03-11: 0.4 mg via INTRAVENOUS

## 2016-03-11 MED ORDER — DICYCLOMINE HCL 10 MG PO CAPS: 10.0000 mg | ORAL_CAPSULE | Freq: Two times a day (BID) | ORAL | Status: DC | PRN

## 2016-03-11 MED ORDER — BISACODYL 10 MG RE SUPP: 10.0000 mg | Freq: Every day | RECTAL | Status: DC | PRN

## 2016-03-11 MED ORDER — ACETAMINOPHEN 10 MG/ML IV SOLN
INTRAVENOUS | Status: AC
  Filled 2016-03-11: qty 100

## 2016-03-11 MED ORDER — HYDROMORPHONE HCL 1 MG/ML IJ SOLN
0.2500 mg | INTRAMUSCULAR | Status: DC | PRN
  Administered 2016-03-11 (×4): 0.5 mg via INTRAVENOUS

## 2016-03-11 MED ORDER — LORATADINE 10 MG PO TABS
10.0000 mg | ORAL_TABLET | Freq: Every day | ORAL | Status: DC
  Administered 2016-03-11: 10 mg via ORAL
  Filled 2016-03-11: qty 1

## 2016-03-11 MED ORDER — HYDROMORPHONE HCL 1 MG/ML IJ SOLN
INTRAMUSCULAR | Status: AC
  Administered 2016-03-11: 0.5 mg via INTRAVENOUS
  Filled 2016-03-11: qty 1

## 2016-03-11 MED ORDER — MONTELUKAST SODIUM 10 MG PO TABS: 10.0000 mg | ORAL_TABLET | Freq: Every morning | ORAL | Status: DC

## 2016-03-11 MED ORDER — NEOSTIGMINE METHYLSULFATE 10 MG/10ML IV SOLN
INTRAVENOUS | Status: DC | PRN
  Administered 2016-03-11: 2 mg via INTRAVENOUS

## 2016-03-11 MED ORDER — ALBUTEROL SULFATE (2.5 MG/3ML) 0.083% IN NEBU: 2.5000 mg | INHALATION_SOLUTION | Freq: Four times a day (QID) | RESPIRATORY_TRACT | Status: DC | PRN

## 2016-03-11 MED ORDER — ACETAMINOPHEN 10 MG/ML IV SOLN
INTRAVENOUS | Status: DC | PRN
  Administered 2016-03-11: 1000 mg via INTRAVENOUS

## 2016-03-11 MED ORDER — ALPRAZOLAM 0.5 MG PO TABS: 0.5000 mg | ORAL_TABLET | Freq: Every evening | ORAL | Status: DC | PRN

## 2016-03-11 MED ORDER — FLUTICASONE PROPIONATE 50 MCG/ACT NA SUSP
1.0000 | Freq: Every day | NASAL | Status: DC | PRN
  Filled 2016-03-11: qty 16

## 2016-03-11 MED ORDER — BUPIVACAINE-EPINEPHRINE 0.25% -1:200000 IJ SOLN
INTRAMUSCULAR | Status: DC | PRN
  Administered 2016-03-11: 6 mL

## 2016-03-11 MED ORDER — ONDANSETRON HCL 4 MG/2ML IJ SOLN
4.0000 mg | INTRAMUSCULAR | Status: DC | PRN
  Administered 2016-03-11: 4 mg via INTRAVENOUS
  Filled 2016-03-11: qty 2

## 2016-03-11 MED ORDER — LIDOCAINE HCL (CARDIAC) 20 MG/ML IV SOLN
INTRAVENOUS | Status: DC | PRN
  Administered 2016-03-11: 100 mg via INTRAVENOUS

## 2016-03-11 MED ORDER — DEXAMETHASONE SODIUM PHOSPHATE 10 MG/ML IJ SOLN
INTRAMUSCULAR | Status: DC | PRN
  Administered 2016-03-11: 10 mg via INTRAVENOUS

## 2016-03-11 MED ORDER — LIDOCAINE 2% (20 MG/ML) 5 ML SYRINGE
INTRAMUSCULAR | Status: AC
  Filled 2016-03-11: qty 5

## 2016-03-11 MED ORDER — KETAMINE HCL 100 MG/ML IJ SOLN
INTRAMUSCULAR | Status: AC
  Filled 2016-03-11: qty 1

## 2016-03-11 MED ORDER — LACTATED RINGERS IV SOLN
INTRAVENOUS | Status: DC
  Administered 2016-03-11 (×2): via INTRAVENOUS

## 2016-03-11 MED ORDER — ROCURONIUM BROMIDE 50 MG/5ML IV SOLN
INTRAVENOUS | Status: AC
  Filled 2016-03-11: qty 1

## 2016-03-11 MED ORDER — MIDAZOLAM HCL 5 MG/5ML IJ SOLN
INTRAMUSCULAR | Status: DC | PRN
  Administered 2016-03-11: 2 mg via INTRAVENOUS

## 2016-03-11 MED ORDER — ONDANSETRON HCL 4 MG/2ML IJ SOLN
INTRAMUSCULAR | Status: DC | PRN
  Administered 2016-03-11 (×2): 4 mg via INTRAVENOUS

## 2016-03-11 MED ORDER — METHOCARBAMOL 1000 MG/10ML IJ SOLN
500.0000 mg | Freq: Four times a day (QID) | INTRAVENOUS | Status: DC | PRN
  Filled 2016-03-11: qty 5

## 2016-03-11 MED ORDER — ALBUTEROL SULFATE HFA 108 (90 BASE) MCG/ACT IN AERS: 2.0000 | INHALATION_SPRAY | Freq: Four times a day (QID) | RESPIRATORY_TRACT | Status: DC | PRN

## 2016-03-11 MED ORDER — SODIUM CHLORIDE 0.9% FLUSH: 3.0000 mL | Freq: Two times a day (BID) | INTRAVENOUS | Status: DC

## 2016-03-11 MED ORDER — SODIUM CHLORIDE 0.45 % IV SOLN: INTRAVENOUS | Status: DC

## 2016-03-11 MED ORDER — MIDAZOLAM HCL 2 MG/2ML IJ SOLN
INTRAMUSCULAR | Status: AC
  Filled 2016-03-11: qty 2

## 2016-03-11 MED ORDER — SODIUM CHLORIDE 0.9% FLUSH: 3.0000 mL | INTRAVENOUS | Status: DC | PRN

## 2016-03-11 MED ORDER — ONDANSETRON HCL 4 MG/2ML IJ SOLN
INTRAMUSCULAR | Status: AC
  Filled 2016-03-11: qty 4

## 2016-03-11 MED ORDER — CHLORHEXIDINE GLUCONATE 4 % EX LIQD
60.0000 mL | Freq: Once | CUTANEOUS | Status: DC
Start: 2016-03-11 — End: 2016-03-11

## 2016-03-11 MED ORDER — PROPOFOL 10 MG/ML IV BOLUS
INTRAVENOUS | Status: AC
  Filled 2016-03-11: qty 20

## 2016-03-11 MED ORDER — PROPOFOL 10 MG/ML IV BOLUS
INTRAVENOUS | Status: DC | PRN
  Administered 2016-03-11: 150 mg via INTRAVENOUS

## 2016-03-11 MED ORDER — PHENOL 1.4 % MT LIQD: 1.0000 | OROMUCOSAL | Status: DC | PRN

## 2016-03-11 MED ORDER — FENTANYL CITRATE (PF) 100 MCG/2ML IJ SOLN
INTRAMUSCULAR | Status: DC | PRN
  Administered 2016-03-11 (×2): 50 ug via INTRAVENOUS
  Administered 2016-03-11: 150 ug via INTRAVENOUS
  Administered 2016-03-11: 50 ug via INTRAVENOUS
  Administered 2016-03-11: 100 ug via INTRAVENOUS

## 2016-03-11 MED ORDER — CEFAZOLIN SODIUM-DEXTROSE 2-4 GM/100ML-% IV SOLN
2.0000 g | Freq: Three times a day (TID) | INTRAVENOUS | Status: AC
  Administered 2016-03-11 – 2016-03-12 (×2): 2 g via INTRAVENOUS
  Filled 2016-03-11 (×2): qty 100

## 2016-03-11 MED ORDER — SODIUM CHLORIDE 0.9 % IV SOLN: 250.0000 mL | INTRAVENOUS | Status: DC

## 2016-03-11 MED ORDER — HYDROCODONE-ACETAMINOPHEN 7.5-325 MG PO TABS: 2.0000 | ORAL_TABLET | Freq: Once | ORAL | Status: DC | PRN

## 2016-03-11 MED ORDER — METHOCARBAMOL 500 MG PO TABS
500.0000 mg | ORAL_TABLET | Freq: Four times a day (QID) | ORAL | Status: DC | PRN
  Administered 2016-03-11 – 2016-03-12 (×2): 500 mg via ORAL
  Filled 2016-03-11 (×2): qty 1

## 2016-03-11 MED ORDER — 0.9 % SODIUM CHLORIDE (POUR BTL) OPTIME
TOPICAL | Status: DC | PRN
  Administered 2016-03-11: 1000 mL

## 2016-03-11 MED ORDER — MENTHOL 3 MG MT LOZG: 1.0000 | LOZENGE | OROMUCOSAL | Status: DC | PRN

## 2016-03-11 MED ORDER — SCOPOLAMINE 1 MG/3DAYS TD PT72
MEDICATED_PATCH | TRANSDERMAL | Status: AC
  Administered 2016-03-11: 1 via TRANSDERMAL
  Filled 2016-03-11: qty 1

## 2016-03-11 MED ORDER — KETAMINE HCL 100 MG/ML IJ SOLN
INTRAMUSCULAR | Status: DC | PRN
  Administered 2016-03-11: 10 mg via INTRAVENOUS
  Administered 2016-03-11: 20 mg via INTRAVENOUS
  Administered 2016-03-11: 50 mg via INTRAVENOUS
  Administered 2016-03-11: 20 mg via INTRAVENOUS

## 2016-03-11 MED ORDER — HYDROMORPHONE HCL 1 MG/ML IJ SOLN
0.5000 mg | INTRAMUSCULAR | Status: DC | PRN
  Administered 2016-03-11 – 2016-03-12 (×2): 0.5 mg via INTRAVENOUS
  Filled 2016-03-11 (×2): qty 1

## 2016-03-11 MED ORDER — PANTOPRAZOLE SODIUM 40 MG PO TBEC
40.0000 mg | DELAYED_RELEASE_TABLET | Freq: Every day | ORAL | Status: DC
  Administered 2016-03-11: 40 mg via ORAL
  Filled 2016-03-11: qty 1

## 2016-03-11 SURGICAL SUPPLY — 57 items
APL SKNCLS STERI-STRIP NONHPOA (GAUZE/BANDAGES/DRESSINGS) ×2
BENZOIN TINCTURE PRP APPL 2/3 (GAUZE/BANDAGES/DRESSINGS) ×3 IMPLANT
BIT DRILL SKYLINE 12MM (BIT) IMPLANT
BLADE SURG ROTATE 9660 (MISCELLANEOUS) IMPLANT
BONE CERV LORDOTIC 14.5X12X6 (Bone Implant) ×2 IMPLANT
BONE CERV LORDOTIC 14.5X12X7 (Bone Implant) ×2 IMPLANT
BUR ROUND FLUTED 4 SOFT TCH (BURR) ×1 IMPLANT
COLLAR CERV LO CONTOUR FIRM DE (SOFTGOODS) ×1 IMPLANT
CORDS BIPOLAR (ELECTRODE) ×2 IMPLANT
COVER SURGICAL LIGHT HANDLE (MISCELLANEOUS) ×2 IMPLANT
CRADLE DONUT ADULT HEAD (MISCELLANEOUS) ×2 IMPLANT
DRAPE C-ARM 42X72 X-RAY (DRAPES) ×2 IMPLANT
DRAPE MICROSCOPE LEICA (MISCELLANEOUS) ×2 IMPLANT
DRAPE PROXIMA HALF (DRAPES) ×2 IMPLANT
DRILL BIT SKYLINE 12MM (BIT) ×2
DURAPREP 6ML APPLICATOR 50/CS (WOUND CARE) ×2 IMPLANT
ELECT COATED BLADE 2.86 ST (ELECTRODE) ×2 IMPLANT
ELECT REM PT RETURN 9FT ADLT (ELECTROSURGICAL) ×2
ELECTRODE REM PT RTRN 9FT ADLT (ELECTROSURGICAL) ×1 IMPLANT
EVACUATOR 1/8 PVC DRAIN (DRAIN) ×2 IMPLANT
GAUZE SPONGE 4X4 12PLY STRL (GAUZE/BANDAGES/DRESSINGS) ×2 IMPLANT
GAUZE XEROFORM 1X8 LF (GAUZE/BANDAGES/DRESSINGS) IMPLANT
GLOVE BIO SURGEON STRL SZ 6.5 (GLOVE) ×1 IMPLANT
GLOVE BIOGEL PI IND STRL 8 (GLOVE) ×2 IMPLANT
GLOVE BIOGEL PI INDICATOR 8 (GLOVE) ×2
GLOVE ORTHO TXT STRL SZ7.5 (GLOVE) ×4 IMPLANT
GOWN STRL REUS W/ TWL LRG LVL3 (GOWN DISPOSABLE) ×1 IMPLANT
GOWN STRL REUS W/ TWL XL LVL3 (GOWN DISPOSABLE) ×1 IMPLANT
GOWN STRL REUS W/TWL 2XL LVL3 (GOWN DISPOSABLE) ×2 IMPLANT
GOWN STRL REUS W/TWL LRG LVL3 (GOWN DISPOSABLE) ×2
GOWN STRL REUS W/TWL XL LVL3 (GOWN DISPOSABLE) ×2
GRAFT BNE SPCR VG2 14.5X12X6 (Bone Implant) IMPLANT
GRAFT BNE SPCR VG2 14.5X12X7 (Bone Implant) IMPLANT
HEAD HALTER (SOFTGOODS) ×2 IMPLANT
HEMOSTAT SURGICEL 2X14 (HEMOSTASIS) IMPLANT
KIT BASIN OR (CUSTOM PROCEDURE TRAY) ×2 IMPLANT
KIT ROOM TURNOVER OR (KITS) ×2 IMPLANT
MANIFOLD NEPTUNE II (INSTRUMENTS) ×1 IMPLANT
NDL 25GX 5/8IN NON SAFETY (NEEDLE) ×1 IMPLANT
NEEDLE 25GX 5/8IN NON SAFETY (NEEDLE) ×2 IMPLANT
NS IRRIG 1000ML POUR BTL (IV SOLUTION) ×2 IMPLANT
PACK ORTHO CERVICAL (CUSTOM PROCEDURE TRAY) ×2 IMPLANT
PAD ARMBOARD 7.5X6 YLW CONV (MISCELLANEOUS) ×4 IMPLANT
PATTIES SURGICAL .5 X.5 (GAUZE/BANDAGES/DRESSINGS) IMPLANT
PIN TEMP SKYLINE THREADED (PIN) ×1 IMPLANT
PLATE TWO LEVEL SKYLINE 30MM (Plate) ×1 IMPLANT
RESTRAINT LIMB HOLDER UNIV (RESTRAINTS) ×1 IMPLANT
SCREW VARIABLE SELF TAP 12MM (Screw) ×6 IMPLANT
STRIP CLOSURE SKIN 1/2X4 (GAUZE/BANDAGES/DRESSINGS) ×2 IMPLANT
SURGIFLO W/THROMBIN 8M KIT (HEMOSTASIS) ×1 IMPLANT
SUT BONE WAX W31G (SUTURE) ×2 IMPLANT
SUT VIC AB 3-0 X1 27 (SUTURE) ×2 IMPLANT
SUT VICRYL 4-0 PS2 18IN ABS (SUTURE) ×4 IMPLANT
SYR 30ML SLIP (SYRINGE) ×2 IMPLANT
TOWEL OR 17X24 6PK STRL BLUE (TOWEL DISPOSABLE) ×2 IMPLANT
TOWEL OR 17X26 10 PK STRL BLUE (TOWEL DISPOSABLE) ×2 IMPLANT
TRAY FOLEY CATH 16FR SILVER (SET/KITS/TRAYS/PACK) IMPLANT

## 2016-03-11 NOTE — Brief Op Note (Signed)
03/11/2016  3:59 PM  PATIENT:  Karina Greer  50 y.o. 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

## 2016-03-11 NOTE — Anesthesia Procedure Notes (Addendum)
Procedure Name: Intubation Date/Time: 03/11/2016 1:34 PM Performed by: Salli Quarry Hermen Mario Pre-anesthesia Checklist: Patient identified, Emergency Drugs available, Suction available and Patient being monitored Patient Re-evaluated:Patient Re-evaluated prior to inductionOxygen Delivery Method: Circle System Utilized Preoxygenation: Pre-oxygenation with 100% oxygen Intubation Type: IV induction Ventilation: Mask ventilation without difficulty Laryngoscope Size: Glidescope and 3 Grade View: Grade I Tube type: Oral Tube size: 7.0 mm Number of attempts: 1 Airway Equipment and Method: Stylet and Video-laryngoscopy Placement Confirmation: ETT inserted through vocal cords under direct vision,  positive ETCO2 and breath sounds checked- equal and bilateral Secured at: 22 cm Tube secured with: Tape Dental Injury: Teeth and Oropharynx as per pre-operative assessment

## 2016-03-11 NOTE — Interval H&P Note (Signed)
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 After consideration of risks, benefits and other options for treatment, the patient has consented to  Procedure(s): C5-6, C6-7 Anterior Cervical Discectomy and Fusion, Allograft, Plate (N/A) as a surgical intervention .  The patient's history has been reviewed, patient examined, no change in status, stable for surgery.  I have reviewed the patient's chart and labs.  Questions were answered to the patient's satisfaction.     Marketia Stallsmith C

## 2016-03-11 NOTE — Anesthesia Preprocedure Evaluation (Addendum)
Anesthesia Evaluation  Patient identified by MRN, date of birth, ID band Patient awake    Reviewed: Allergy & Precautions, H&P , NPO status , Patient's Chart, lab work & pertinent test results  History of Anesthesia Complications (+) PONV and history of anesthetic complications  Airway Mallampati: II  TM Distance: >3 FB Neck ROM: full    Dental no notable dental hx.    Pulmonary asthma , sleep apnea and Continuous Positive Airway Pressure Ventilation ,    Pulmonary exam normal breath sounds clear to auscultation       Cardiovascular negative cardio ROS Normal cardiovascular exam Rhythm:regular Rate:Normal     Neuro/Psych  Headaches, PSYCHIATRIC DISORDERS Anxiety Depression  Neuromuscular disease    GI/Hepatic Neg liver ROS, GERD  ,  Endo/Other  Morbid obesity  Renal/GU negative Renal ROS     Musculoskeletal  (+) Arthritis , Fibromyalgia -  Abdominal   Peds  Hematology negative hematology ROS (+)   Anesthesia Other Findings Chronic pain which is psychiatric in nature  Reproductive/Obstetrics negative OB ROS                            Anesthesia Physical Anesthesia Plan  ASA: III  Anesthesia Plan: General   Post-op Pain Management:    Induction: Intravenous  Airway Management Planned: Oral ETT and Video Laryngoscope Planned  Additional Equipment:   Intra-op Plan:   Post-operative Plan: Extubation in OR  Informed Consent: I have reviewed the patients History and Physical, chart, labs and discussed the procedure including the risks, benefits and alternatives for the proposed anesthesia with the patient or authorized representative who has indicated his/her understanding and acceptance.   Dental Advisory Given  Plan Discussed with: Anesthesiologist, CRNA and Surgeon  Anesthesia Plan Comments: (Multimodal with ketamine, opioid, acetaminophen)       Anesthesia Quick  Evaluation

## 2016-03-11 NOTE — Transfer of Care (Signed)
Immediate Anesthesia Transfer of Care Note  Patient: Karina Greer  Procedure(s) Performed: Procedure(s): C5-6, C6-7 Anterior Cervical Discectomy and Fusion, Allograft, Plate (N/A)  Patient Location: PACU  Anesthesia Type:General  Level of Consciousness: sedated  Airway & Oxygen Therapy: Patient Spontanous Breathing and Patient connected to nasal cannula oxygen  Post-op Assessment: Report given to RN and Post -op Vital signs reviewed and stable  Post vital signs: stable  Last Vitals:  Filed Vitals:   03/11/16 1037  BP: 140/84  Pulse: 83  Temp: 36.8 C  Resp: 18    Last Pain: There were no vitals filed for this visit.    Patients Stated Pain Goal: 7 ([id number])  Complications: No apparent anesthesia complications

## 2016-03-11 NOTE — H&P (Signed)
Karina Greer is an 50 y.o. female.   CHIEF COMPLAINT:    C5-6 HNP with compression and C6-7 spondylosis.    HISTORY OF PRESENT ILLNESS:   This 50 year old female returns, she states she is continuing to have pain.  We sent her for a month of therapy, she states the therapy, of which she has worked hard with the therapist, is actually making her symptoms worse.  She has been worked up for some reflux and is doing much better.  She continues to have neck pain, shoulder pain, pain that radiates down more to the left C6 distribution than right.  She has cephalad migration of disk material behind the vertebral body with compression.  She has had several prednisone packs without relief.  She has had over a month of physical therapy.  Patient is normally followed at [facility] by Cyndi Bender, P.A.    CURRENT MEDICATIONS:   Include B6, Norco 10  mg for the neck pain, Maxalt/hydrochlorothiazide  p.r.n. swelling, Zyrtec, Singulair daily, tizanidine muscle relaxant for her neck, Bentyl 20 mg daily, ondansetron 4 mg daily, Flovent inhaler, bupropion, Dexilant.   ALLERGIES:    Certain ANTI-INFLAMMATORIES, ROBAXIN, CODEINE bothers her stomach, REQUIP, ATORVASTATIN.   SOCIAL HISTORY:    Patient is disabled, divorced, does not work, does not smoke or drink.   REVIEW OF SYSTEMS:  Positive for acid reflux, depression, anxiety, bladder problems, asthma, bronchitis, hypertension, migraines, pneumonia in the distant past, sleep apnea.   Past Medical History  Diagnosis Date  . Fibromyalgia   . Depression   . Dyslipidemia   . Arthritis   . GERD (gastroesophageal reflux disease)   . Chronic low back pain   . History of renal calculi   . RLS (restless legs syndrome)   . Interstitial cystitis   . IBS (irritable bowel syndrome)   . 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 PONV (postoperative nausea and vomiting)   . Anxiety disorder   . SI (sacroiliac) joint dysfunction   . SUI (stress  urinary incontinence, female)   . Anxiety   . History of panic attacks   . Seasonal asthma   . Menorrhagia   . Lupus ([place])     tested positive for antibodies for lupus. dr to do further tests  . OSA on CPAP     not used cpap for several weeks  . Pneumonia     hx  . History of kidney stones   . UTI (lower urinary tract infection)     hx  . Headache     hx migraines    Past Surgical History  Procedure Laterality Date  . Laparoscopic ovarian cyst bx  2005    AND  URETEROSCOPIC LASER LITHO  STONE EXTRACTION  . Cysto/  urethral dilation/  hydrodistention/   instillation therapy  07-16-2010//   12-30-2007//   10-27-2006  . Transthoracic echocardiogram  06-07-2006    normal study/  ef 60-65%  . Exercise tolerence test  10-12-2010    NEGATIVE  ADEQUATE ETT/  NO ISCHEMIA OR EVIDENCE HIGH GRADE OBSTRUCTIVE CAD/  NO FURTHER TEST NEEDED  . Tubal ligation Bilateral 1995  . Shoulder open rotator cuff repair Right 2004  . Cholecystectomy  2014  . Hysteroscopy w/  novasure endometrial ablation  2014  . Cysto with hydrodistension N/A 01/26/2014    Procedure: CYSTOSCOPY/HYDRODISTENSION;  Surgeon: Bernestine Amass, MD;  Location: [facility];  Service: Urology;  Laterality: N/A;  . Tonsillectomy    .  Achilles tendon surgery Right 3/16    Family History  Problem Relation Age of Onset  . Hypertension Mother   . Diabetes Father   . Fibromyalgia Sister   . Hypertension Other     Cancer, Cerebrovascular disease run on mother side of family   Social History:  reports that she has never smoked. She has never used smokeless tobacco. She reports that she does not drink alcohol or use illicit drugs.  Allergies:  Allergies  Allergen Reactions  . Alendronate Sodium Anaphylaxis    Closes throat up  . Milnacipran Hcl Other (See Comments)    Makes patient bleed "from everywhere."  . Elavil [Amitriptyline Hcl] Other (See Comments)    Hallucinations   . Tramadol Palpitations and Other  (See Comments)    Tachycardia   . Codeine Sulfate Other (See Comments)  . Cymbalta [Duloxetine Hcl] Other (See Comments)    Cannot remember   . Lamotrigine   . Morphine Other (See Comments)    Tachycardia per pt  . Nsaids Other (See Comments)    Upset stomach  . Other     bandaid  . Skelaxin Other (See Comments)    Cannot remember  . Tessalon [Benzonatate] Other (See Comments)    "FEELS LIKE I'M CHOKING"  . Oxycodone Itching and Rash    SEVERE ITCHING  . Oxycodone-Acetaminophen Palpitations    Medications Prior to Admission  Medication Sig Dispense Refill  . albuterol (PROVENTIL HFA;VENTOLIN HFA) 108 (90 BASE) MCG/ACT inhaler Inhale 2 puffs into the lungs every 6 (six) hours as needed.     [name] albuterol (PROVENTIL) (2.5 MG/3ML) 0.083% nebulizer solution INHALE 1 VIAL VIA NEBULIZER EVERY 6 HOURS AS NEEDED  1  . ALPRAZolam (XANAX) 0.5 MG tablet Take 0.5 mg by mouth at bedtime as needed for anxiety.    . cetirizine (ZYRTEC) 10 MG tablet TAKE 1 TABLET BY MOUTH EVERY DAY FOR ALLERGIES  1  . DEXILANT 30 MG capsule Take 1 capsule by mouth 2 (two) times daily.    [name] dicyclomine (BENTYL) 10 MG capsule Take 10 mg by mouth 2 (two) times daily. As needed    . EPIPEN 2-PAK 0.3 MG/0.3ML SOAJ injection 0.3 mg See admin instructions.   0  . fluticasone (FLONASE) 50 MCG/ACT nasal spray Place 1 spray into both nostrils daily as needed for allergies.     [name] HYDROcodone-acetaminophen (NORCO) 10-325 MG tablet TAKE 1 TABLET BY MOUTH EVERY 6 HOURS AS NEEDED FOR PAIN 120 tablet 0  . meclizine (ANTIVERT) 25 MG tablet Take 1 tablet by mouth 3 (three) times daily as needed.    . montelukast (SINGULAIR) 10 MG tablet Take 10 mg by mouth every morning.    . ondansetron (ZOFRAN-ODT) 4 MG disintegrating tablet Take 1 tablet by mouth 3 (three) times daily as needed.    [name] PRESCRIPTION MEDICATION every 14 (fourteen) days. 2 ALLERGY INJECTIONS EVERY 2 WKS    . rizatriptan (MAXALT) 10 MG tablet Take 1 tablet (10 mg  total) by mouth 2 (two) times daily as needed. May repeat in 2 hours if needed 10 tablet 5  . Vitamin D, Ergocalciferol, (DRISDOL) 50000 units CAPS capsule Take 50,000 Units by mouth every 7 (seven) days. THURSDAYS    . b complex vitamins tablet Take 1 tablet by mouth daily.    [name] tiZANidine (ZANAFLEX) 2 MG tablet Take 1 tablet (2 mg total) by mouth every 6 (six) hours as needed. 90 tablet 3    No results found for this  or any previous visit (from the past 48 hour(s)). No results found.  Review of Systems  Constitutional: Negative.   Respiratory: Negative.   Cardiovascular: Negative.   Gastrointestinal: Negative.   Genitourinary: Negative.   Musculoskeletal: Positive for neck pain.  Psychiatric/Behavioral: Negative.     Blood pressure 140/84, pulse 83, temperature 98.2 F (36.8 C), temperature source Oral, resp. rate 18, height 5\' 3"  (1.6 m), weight 103.103 kg (227 lb 4.8 oz), SpO2 97 %. Physical Exam  Constitutional: She is oriented to person, place, and time. No distress.  HENT:  Head: Atraumatic.  Eyes: EOM are normal.  Respiratory: No respiratory distress.  GI: She exhibits no distension.  Musculoskeletal: She exhibits tenderness.  Neurological: She is alert and oriented to person, place, and time.  Skin: Skin is warm and dry.  Psychiatric: She has a normal mood and affect.      PHYSICAL EXAMINATION:  Patient is 5 feet 3 inches, 225 pounds, BMI is 40, BP is 155/90.  Extraocular movements intact.  She has bilateral brachial plexus tenderness.  Decreased sensation in the radial side of the left hand.  No audible wheezing. No rash over exposed skin.  Skin over neck is normal, thyroid is normal to palpation.  Upper extremity reflexes are 2+, no lower extremity hyperreflexia.  Normal heel-toe gait.  Biceps, triceps, wrist flexion, pronation, extension, interossei are normal.  EMGs,  nerve conduction velocity shows some evidence of right ulnar nerve sensitivity, no evidence of carpal  tunnel syndrome.  Patient has been treated at [facility], has been through physical therapy, multiple Medrol Dosepaks, traction and is having persistent neck pain.  MRI scan 12/04/2015 showed broad-based disk protrusion, flattening of the cord with cephalad migration of disk material behind C5 vertebral body causing mild to moderate canal stenosis.  Diffuse disk bulge left paracentral at C6-7 with uncovertebral spurring and osteophytes causing mild canal stenosis.  Shallow protrusion at C7-T1, no compression.  C4-5 level shows minimal changes.   ASSESSMENT:  C5-6 HNP, C6-7 spondylosis.   PLAN:  Two level cervical fusion, allograft and plate.  She is on Medicare, she would be admission only procedure, overnight stay, discharged the following morning.  Soft collar x6 weeks.  We discussed dysphonia, dysphagia, pseudoarthrosis and need for posterior fusion if the anterior fusion did not heal, which we discussed in detail at previous visit.  All questions answered.  She requested we proceed pending insurance approval.    Lanae Crumbly, PA-C 03/11/2016, 12:02 PM

## 2016-03-11 NOTE — Anesthesia Postprocedure Evaluation (Signed)
Anesthesia Post Note  Patient: Karina Greer  Procedure(s) Performed: Procedure(s) (LRB): C5-6, C6-7 Anterior Cervical Discectomy and Fusion, Allograft, Plate (N/A)  Patient location during evaluation: PACU Anesthesia Type: General Level of consciousness: awake Pain management: pain level controlled Vital Signs Assessment: post-procedure vital signs reviewed and stable Respiratory status: spontaneous breathing Cardiovascular status: stable Anesthetic complications: no    Last Vitals:  Filed Vitals:   03/11/16 1628 03/11/16 1643  BP: 140/88 140/90  Pulse: 104 93  Temp: 36.8 C   Resp: 15 18    Last Pain:  Filed Vitals:   03/11/16 1646  PainSc: 7                  EDWARDS,Chasitty Hehl

## 2016-03-11 NOTE — Op Note (Signed)
NAMENALLELY, FADLEY NO.:  [id number]  MEDICAL RECORD NO.:  [id number]  LOCATION:  [id number]                        FACILITY:  [facility]  PHYSICIAN:  Mark C. Lorin Mercy, M.D.    DATE OF BIRTH:  [birth year]  DATE OF PROCEDURE:  03/11/2016 DATE OF DISCHARGE:                              OPERATIVE REPORT   PREOPERATIVE DIAGNOSIS:  C5-6 herniated nucleus pulposus with compression, C6-7 spondylosis.  POSTOPERATIVE DIAGNOSIS:  C5-6 herniated nucleus pulposus with compression, C6-7 spondylosis.  PROCEDURE:  C5-6, C6-7 anterior cervical diskectomy and fusion, allograft and plate.  SURGEON:  Mark C. Lorin Mercy, M.D.  ASSISTANT:  Alyson Locket. Ricard Dillon, PA-C, medically necessary and present for the entire procedure.  DRAINS:  One Hemovac neck.  ANESTHESIA:  GOT plus 6 mL Marcaine.  IMPLANTS:  Synthes Skyline 30-mm implant, 7-mm graft at C5-6, 6-mm C6-7, 12-mm screws x6.  DESCRIPTION OF PROCEDURE:  After standard prepping and draping, arms tucked to the side with traction available with wrist restraints.  Due to the patient's body habitus, neck was prepped, after head halter traction had been applied without weight.  Neck was prepped with DuraPrep.  The area was squared with towels, sterile skin marker, used one-half of a Betadine Steri-Drape, sterile Mayo stand at the head and thyroid sheet and drape.  Ancef prophylaxis.  Time-out completed. Incision was made started at the midline extending to the left. Platysma was divided.  Omohyoid was noted in the disk level after splitting the longus coli in the midline just above the omohyoid with short 25 needle, with a straight clamp, draped, C-arm brought in. Lateral image showed this was C4-5.  We jumped on the other side of the omohyoid, prominent spurs, C5-6 needle placed and then second spot fluoro picture confirming this was the appropriate level.  C5-6 was done first, which had the larger HNP with compression.  Spurs were  removed anteriorly.  Cloward retractor was placed.  Teeth blades right and left; smooth blades, cephalad and caudad.  Microscope was brought in and draped as the posterior aspect of the disk was removed.  Overhanging spurs were removed.  Chunks of ligament and disk.  There were no extruded fragments.  All disk fragments were retained by the ligament, but it was causing diffuse bulge with compression.  This was decompressed with the dura exposed.  Uncovertebral joints were stripped. Trial sizers 6 was not quite tight enough, 7 gave good fit.  A 7-mm graft was marked anteriorly with the marker.  Vertically impacted, countersunk 2-mm was snug tight.  Identical procedure repeated at C6-7. At this level, there was primarily spondylitic changes with some narrowing of the disk space, spurs some disk, put primarily spur overhanging and uncovertebral changes, which were stripped.  A 6-mm graft was placed at this size, plate was selected.  C-arm was brought back in and confirmed appropriate position.  Screws were filled, rechecked with C-arm.  Screws were locked in.  Hemovac placed with in-and-out technique, 3-0 Vicryl in the platysmal, 4-0 Vicryl subcuticular closure.  Operative field was dry.  Tincture of benzoin and Steri-Strips.  Instrument count and needle count were correct.     Mark C. Lorin Mercy, M.D.  MCY/MEDQ  D:  03/11/2016  T:  03/11/2016  Job:  [id number]

## 2016-03-12 ENCOUNTER — Encounter (HOSPITAL_COMMUNITY): Payer: Self-pay | Admitting: Orthopaedic Surgery

## 2016-03-12 DIAGNOSIS — G8929 Other chronic pain: Secondary | ICD-10-CM | POA: Diagnosis not present

## 2016-03-12 DIAGNOSIS — M50022 Cervical disc disorder at C5-C6 level with myelopathy: Secondary | ICD-10-CM | POA: Diagnosis not present

## 2016-03-12 DIAGNOSIS — M4712 Other spondylosis with myelopathy, cervical region: Secondary | ICD-10-CM | POA: Diagnosis not present

## 2016-03-12 DIAGNOSIS — M545 Low back pain: Secondary | ICD-10-CM | POA: Diagnosis not present

## 2016-03-12 DIAGNOSIS — J45998 Other asthma: Secondary | ICD-10-CM | POA: Diagnosis not present

## 2016-03-12 DIAGNOSIS — K219 Gastro-esophageal reflux disease without esophagitis: Secondary | ICD-10-CM | POA: Diagnosis not present

## 2016-03-12 MED ORDER — HYDROCODONE-ACETAMINOPHEN 10-325 MG PO TABS: 1.0000 | ORAL_TABLET | Freq: Four times a day (QID) | ORAL | Status: DC | PRN

## 2016-03-12 NOTE — Progress Notes (Signed)
Subjective: 1 Day Post-Op Procedure(s) (LRB): C5-6, C6-7 Anterior Cervical Discectomy and Fusion, Allograft, Plate (N/A) Patient reports pain as mild.  Good relief of hand pain and numbness she had pre-op.   Objective: Vital signs in last 24 hours: Temp:  [97.6 F (36.4 C)-98.8 F (37.1 C)] 97.6 F (36.4 C) (06/20 0502) Pulse Rate:  [76-110] 76 (06/20 0502) Resp:  [15-26] 16 (06/20 0502) BP: (119-150)/(72-96) 119/73 mmHg (06/20 0502) SpO2:  [91 %-97 %] 96 % (06/20 0502) Weight:  [103.103 kg (227 lb 4.8 oz)] 103.103 kg (227 lb 4.8 oz) (06/19 1037)  Intake/Output from previous day: 06/19 0701 - 06/20 0700 In: 1500 [I.V.:1500] Out: 50 [Drains:20; Blood:30] Intake/Output this shift:    No results for input(s): HGB in the last 72 hours. No results for input(s): WBC, RBC, HCT, PLT in the last 72 hours. No results for input(s): NA, K, CL, CO2, BUN, CREATININE, GLUCOSE, CALCIUM in the last 72 hours. No results for input(s): LABPT, INR in the last 72 hours.  Neurologically intact  Assessment/Plan: 1 Day Post-Op Procedure(s) (LRB): C5-6, C6-7 Anterior Cervical Discectomy and Fusion, Allograft, Plate (N/A) Plan: discharge [place] C 03/12/2016, 7:45 AM

## 2016-03-12 NOTE — Progress Notes (Signed)
Orthopedic Tech Progress Note Patient Details:  Karina Greer Sep 26, 1965 [id number]  Ortho Devices Type of Ortho Device: Soft collar Ortho Device/Splint Location: neck Ortho Device/Splint Interventions: Loanne Drilling, Jaylin Benzel 03/12/2016, 7:41 AM

## 2016-03-12 NOTE — Discharge Instructions (Signed)
Keep collar on except when changing to the shower collar. See Dr. Lorin Mercy in about one week. Avoid foods that are difficult to swallow for a couple weeks.

## 2016-03-12 NOTE — Progress Notes (Signed)
Changed dressing per Dr Lorin Mercy, clean dry and intact

## 2016-03-21 DIAGNOSIS — J301 Allergic rhinitis due to pollen: Secondary | ICD-10-CM | POA: Diagnosis not present

## 2016-03-21 DIAGNOSIS — J3089 Other allergic rhinitis: Secondary | ICD-10-CM | POA: Diagnosis not present

## 2016-03-21 DIAGNOSIS — J3081 Allergic rhinitis due to animal (cat) (dog) hair and dander: Secondary | ICD-10-CM | POA: Diagnosis not present

## 2016-03-22 DIAGNOSIS — M4712 Other spondylosis with myelopathy, cervical region: Secondary | ICD-10-CM | POA: Diagnosis not present

## 2016-03-22 DIAGNOSIS — M50222 Other cervical disc displacement at C5-C6 level: Secondary | ICD-10-CM | POA: Diagnosis not present

## 2016-03-22 DIAGNOSIS — F411 Generalized anxiety disorder: Secondary | ICD-10-CM | POA: Diagnosis not present

## 2016-03-22 DIAGNOSIS — F331 Major depressive disorder, recurrent, moderate: Secondary | ICD-10-CM | POA: Diagnosis not present

## 2016-03-27 ENCOUNTER — Telehealth: Payer: Self-pay | Admitting: *Deleted

## 2016-03-27 NOTE — Discharge Summary (Signed)
Patient ID: Karina Greer MRN: [id number] DOB/AGE: Dec 02, 1965 50 y.o.  Admit date: 03/11/2016 Discharge date: 03/27/2016  Admission Diagnoses:  Active Problems:   S/P cervical spinal fusion   Discharge Diagnoses:  Active Problems:   S/P cervical spinal fusion  status post Procedure(s): C5-6, C6-7 Anterior Cervical Discectomy and Fusion, Allograft, Plate  Past Medical History  Diagnosis Date  . Fibromyalgia   . Depression   . Dyslipidemia   . Arthritis   . GERD (gastroesophageal reflux disease)   . Chronic low back pain   . History of renal calculi   . RLS (restless legs syndrome)   . Interstitial cystitis   . IBS (irritable bowel syndrome)   . 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 PONV (postoperative nausea and vomiting)   . Anxiety disorder   . SI (sacroiliac) joint dysfunction   . SUI (stress urinary incontinence, female)   . Anxiety   . History of panic attacks   . Seasonal asthma   . Menorrhagia   . Lupus ([place])     tested positive for antibodies for lupus. dr to do further tests  . OSA on CPAP     not used cpap for several weeks  . Pneumonia     hx  . History of kidney stones   . UTI (lower urinary tract infection)     hx  . Headache     hx migraines    Surgeries: Procedure(s): C5-6, C6-7 Anterior Cervical Discectomy and Fusion, Allograft, Plate on 075-GRM   Consultants:    Discharged Condition: Improved  Hospital Course: Karina Greer is an 50 y.o. female who was admitted 03/11/2016 for operative treatment of cervical stenosis. Patient failed conservative treatments (please see the history and physical for the specifics) and had severe unremitting pain that affects sleep, daily activities and work/hobbies. After pre-op clearance, the patient was taken to the operating room on 03/11/2016 and underwent  Procedure(s): C5-6, C6-7 Anterior Cervical Discectomy and Fusion, Allograft, Plate.    Patient was given perioperative antibiotics:   Anti-infectives    Start     Dose/Rate Route Frequency Ordered Stop   03/11/16 2200  ceFAZolin (ANCEF) IVPB 2g/100 mL premix     2 g 200 mL/hr over 30 Minutes Intravenous Every 8 hours 03/11/16 1623 03/12/16 0640   03/11/16 1200  ceFAZolin (ANCEF) IVPB 2g/100 mL premix     2 g 200 mL/hr over 30 Minutes Intravenous To ShortStay [facility] 03/11/16 1024 03/11/16 1345   03/11/16 1028  ceFAZolin (ANCEF) 2-4 GM/100ML-% IVPB    Comments:  Ronnald Ramp, [place]   : cabinet override      03/11/16 1028 03/11/16 2229       Patient was given sequential compression devices and early ambulation to prevent DVT.   Patient benefited maximally from hospital stay and there were no complications. At the time of discharge, the patient was urinating/moving their bowels without difficulty, tolerating a regular diet, pain is controlled with oral pain medications and they have been cleared by PT/OT.   Recent vital signs: No data found.    Recent laboratory studies: No results for input(s): WBC, HGB, HCT, PLT, NA, K, CL, CO2, BUN, CREATININE, GLUCOSE, INR, CALCIUM in the last 72 hours.  Invalid input(s): PT, 2   Discharge Medications:     Medication List    TAKE these medications        albuterol 108 (90 Base) MCG/ACT inhaler  Commonly known as:  PROVENTIL HFA;VENTOLIN  HFA  Inhale 2 puffs into the lungs every 6 (six) hours as needed.     albuterol (2.5 MG/3ML) 0.083% nebulizer solution  Commonly known as:  PROVENTIL  INHALE 1 VIAL VIA NEBULIZER EVERY 6 HOURS AS NEEDED     ALPRAZolam 0.5 MG tablet  Commonly known as:  XANAX  Take 0.5 mg by mouth at bedtime as needed for anxiety.     b complex vitamins tablet  Take 1 tablet by mouth daily.     cetirizine 10 MG tablet  Commonly known as:  ZYRTEC  TAKE 1 TABLET BY MOUTH EVERY DAY FOR ALLERGIES     DEXILANT 30 MG capsule  Generic drug:  Dexlansoprazole  Take 1 capsule by mouth 2 (two) times daily.     dicyclomine 10 MG capsule  Commonly known as:   BENTYL  Take 10 mg by mouth 2 (two) times daily. As needed     EPIPEN 2-PAK 0.3 mg/0.3 mL Soaj injection  Generic drug:  EPINEPHrine  0.3 mg See admin instructions.     fluticasone 50 MCG/ACT nasal spray  Commonly known as:  FLONASE  Place 1 spray into both nostrils daily as needed for allergies.     HYDROcodone-acetaminophen 10-325 MG tablet  Commonly known as:  NORCO  TAKE 1 TABLET BY MOUTH EVERY 6 HOURS AS NEEDED FOR PAIN     HYDROcodone-acetaminophen 10-325 MG tablet  Commonly known as:  NORCO  Take 1 tablet by mouth every 6 (six) hours as needed.     meclizine 25 MG tablet  Commonly known as:  ANTIVERT  Take 1 tablet by mouth 3 (three) times daily as needed.     montelukast 10 MG tablet  Commonly known as:  SINGULAIR  Take 10 mg by mouth every morning.     ondansetron 4 MG disintegrating tablet  Commonly known as:  ZOFRAN-ODT  Take 1 tablet by mouth 3 (three) times daily as needed.     PRESCRIPTION MEDICATION  every 14 (fourteen) days. 2 ALLERGY INJECTIONS EVERY 2 WKS     rizatriptan 10 MG tablet  Commonly known as:  MAXALT  Take 1 tablet (10 mg total) by mouth 2 (two) times daily as needed. May repeat in 2 hours if needed     tiZANidine 2 MG tablet  Commonly known as:  ZANAFLEX  Take 1 tablet (2 mg total) by mouth every 6 (six) hours as needed.     Vitamin D (Ergocalciferol) 50000 units Caps capsule  Commonly known as:  DRISDOL  Take 50,000 Units by mouth every 7 (seven) days. THURSDAYS        Diagnostic Studies: Dg Chest 2 View  03/01/2016  CLINICAL DATA:  Preoperative radiograph. EXAM: CHEST  2 VIEW COMPARISON:  CT of the chest 01/09/2015 FINDINGS: Cardiomediastinal silhouette is normal. Mediastinal contours appear intact. There is no evidence of focal airspace consolidation, pleural effusion or pneumothorax. Osseous structures are without acute abnormality. Soft tissues are grossly normal. IMPRESSION: No active cardiopulmonary disease. Electronically Signed    By: Fidela Salisbury M.D.   On: 03/01/2016 14:04   Dg Cervical Spine 2-3 Views  03/11/2016  CLINICAL DATA:  50 year old female undergoing cervical spine surgery. Initial encounter. EXAM: DG C-ARM 61-120 MIN; CERVICAL SPINE - 2-3 VIEW COMPARISON:  Cervical spine MRI 12/03/2015. FLUOROSCOPY TIME:  0 minutes 12 seconds FINDINGS: Intraoperative fluoroscopic AP and lateral views of the cervical spine. ACDF hardware is visible at C5-C6 and C6-C7. Endotracheal tube in place. IMPRESSION: C5-C6 and C6-C7 ACDF  hardware in place. Electronically Signed   By: Genevie Ann M.D.   On: 03/11/2016 16:00   Dg C-arm 61-120 Min  03/11/2016  CLINICAL DATA:  50 year old female undergoing cervical spine surgery. Initial encounter. EXAM: DG C-ARM 61-120 MIN; CERVICAL SPINE - 2-3 VIEW COMPARISON:  Cervical spine MRI 12/03/2015. FLUOROSCOPY TIME:  0 minutes 12 seconds FINDINGS: Intraoperative fluoroscopic AP and lateral views of the cervical spine. ACDF hardware is visible at C5-C6 and C6-C7. Endotracheal tube in place. IMPRESSION: C5-C6 and C6-C7 ACDF hardware in place. Electronically Signed   By: Genevie Ann M.D.   On: 03/11/2016 16:00          Follow-up Information    Follow up with YATES,MARK C, MD In 1 week.   Specialty:  Orthopedic Surgery   Contact information:   [address] [place] [address] [phone]       Discharge Plan:  discharge to home  Disposition:     Signed: Lanae Crumbly  03/27/2016, 9:27 AM

## 2016-03-27 NOTE — Telephone Encounter (Signed)
Pharmacy called regarding Dr. Charm Barges  DEA number not being recognized.

## 2016-03-28 NOTE — Telephone Encounter (Signed)
Spoke with patient and the pharmacy. Patient was assured by the pharmacist that the problem was fixed.

## 2016-04-01 DIAGNOSIS — K219 Gastro-esophageal reflux disease without esophagitis: Secondary | ICD-10-CM | POA: Diagnosis not present

## 2016-04-01 DIAGNOSIS — Z79899 Other long term (current) drug therapy: Secondary | ICD-10-CM | POA: Diagnosis not present

## 2016-04-01 DIAGNOSIS — I10 Essential (primary) hypertension: Secondary | ICD-10-CM | POA: Diagnosis not present

## 2016-04-01 DIAGNOSIS — Z6839 Body mass index (BMI) 39.0-39.9, adult: Secondary | ICD-10-CM | POA: Diagnosis not present

## 2016-04-01 DIAGNOSIS — L309 Dermatitis, unspecified: Secondary | ICD-10-CM | POA: Diagnosis not present

## 2016-04-01 DIAGNOSIS — M5090 Cervical disc disorder, unspecified, unspecified cervical region: Secondary | ICD-10-CM | POA: Diagnosis not present

## 2016-04-04 DIAGNOSIS — J301 Allergic rhinitis due to pollen: Secondary | ICD-10-CM | POA: Diagnosis not present

## 2016-04-04 DIAGNOSIS — J3081 Allergic rhinitis due to animal (cat) (dog) hair and dander: Secondary | ICD-10-CM | POA: Diagnosis not present

## 2016-04-04 DIAGNOSIS — J3089 Other allergic rhinitis: Secondary | ICD-10-CM | POA: Diagnosis not present

## 2016-04-11 DIAGNOSIS — J3081 Allergic rhinitis due to animal (cat) (dog) hair and dander: Secondary | ICD-10-CM | POA: Diagnosis not present

## 2016-04-11 DIAGNOSIS — J3089 Other allergic rhinitis: Secondary | ICD-10-CM | POA: Diagnosis not present

## 2016-04-11 DIAGNOSIS — J301 Allergic rhinitis due to pollen: Secondary | ICD-10-CM | POA: Diagnosis not present

## 2016-04-12 ENCOUNTER — Ambulatory Visit: Payer: Medicare Other | Admitting: Registered Nurse

## 2016-04-15 ENCOUNTER — Encounter: Payer: Medicare Other | Attending: Physical Medicine & Rehabilitation | Admitting: Registered Nurse

## 2016-04-15 ENCOUNTER — Encounter: Payer: Self-pay | Admitting: Registered Nurse

## 2016-04-15 VITALS — BP 149/91 | HR 70 | Temp 98.3°F

## 2016-04-15 DIAGNOSIS — M545 Low back pain: Secondary | ICD-10-CM | POA: Diagnosis not present

## 2016-04-15 DIAGNOSIS — F418 Other specified anxiety disorders: Secondary | ICD-10-CM | POA: Diagnosis not present

## 2016-04-15 DIAGNOSIS — G8929 Other chronic pain: Secondary | ICD-10-CM | POA: Diagnosis not present

## 2016-04-15 DIAGNOSIS — K589 Irritable bowel syndrome without diarrhea: Secondary | ICD-10-CM | POA: Insufficient documentation

## 2016-04-15 DIAGNOSIS — G2581 Restless legs syndrome: Secondary | ICD-10-CM | POA: Diagnosis not present

## 2016-04-15 DIAGNOSIS — N920 Excessive and frequent menstruation with regular cycle: Secondary | ICD-10-CM | POA: Diagnosis not present

## 2016-04-15 DIAGNOSIS — M1990 Unspecified osteoarthritis, unspecified site: Secondary | ICD-10-CM | POA: Insufficient documentation

## 2016-04-15 DIAGNOSIS — E785 Hyperlipidemia, unspecified: Secondary | ICD-10-CM | POA: Diagnosis not present

## 2016-04-15 DIAGNOSIS — M47816 Spondylosis without myelopathy or radiculopathy, lumbar region: Secondary | ICD-10-CM | POA: Diagnosis not present

## 2016-04-15 DIAGNOSIS — G4733 Obstructive sleep apnea (adult) (pediatric): Secondary | ICD-10-CM | POA: Diagnosis not present

## 2016-04-15 DIAGNOSIS — Z760 Encounter for issue of repeat prescription: Secondary | ICD-10-CM | POA: Insufficient documentation

## 2016-04-15 DIAGNOSIS — M79671 Pain in right foot: Secondary | ICD-10-CM | POA: Insufficient documentation

## 2016-04-15 DIAGNOSIS — Z5181 Encounter for therapeutic drug level monitoring: Secondary | ICD-10-CM

## 2016-04-15 DIAGNOSIS — L918 Other hypertrophic disorders of the skin: Secondary | ICD-10-CM | POA: Diagnosis not present

## 2016-04-15 DIAGNOSIS — M797 Fibromyalgia: Secondary | ICD-10-CM | POA: Insufficient documentation

## 2016-04-15 DIAGNOSIS — F419 Anxiety disorder, unspecified: Secondary | ICD-10-CM

## 2016-04-15 DIAGNOSIS — N393 Stress incontinence (female) (male): Secondary | ICD-10-CM | POA: Insufficient documentation

## 2016-04-15 DIAGNOSIS — Z6841 Body Mass Index (BMI) 40.0 and over, adult: Secondary | ICD-10-CM | POA: Diagnosis not present

## 2016-04-15 DIAGNOSIS — K219 Gastro-esophageal reflux disease without esophagitis: Secondary | ICD-10-CM | POA: Insufficient documentation

## 2016-04-15 DIAGNOSIS — IMO0001 Reserved for inherently not codable concepts without codable children: Secondary | ICD-10-CM

## 2016-04-15 DIAGNOSIS — M25551 Pain in right hip: Secondary | ICD-10-CM | POA: Insufficient documentation

## 2016-04-15 DIAGNOSIS — E669 Obesity, unspecified: Secondary | ICD-10-CM | POA: Diagnosis not present

## 2016-04-15 DIAGNOSIS — M791 Myalgia: Secondary | ICD-10-CM

## 2016-04-15 DIAGNOSIS — M62838 Other muscle spasm: Secondary | ICD-10-CM

## 2016-04-15 DIAGNOSIS — G894 Chronic pain syndrome: Secondary | ICD-10-CM

## 2016-04-15 DIAGNOSIS — N3010 Interstitial cystitis (chronic) without hematuria: Secondary | ICD-10-CM | POA: Diagnosis not present

## 2016-04-15 DIAGNOSIS — G43909 Migraine, unspecified, not intractable, without status migrainosus: Secondary | ICD-10-CM | POA: Insufficient documentation

## 2016-04-15 DIAGNOSIS — M609 Myositis, unspecified: Secondary | ICD-10-CM

## 2016-04-15 DIAGNOSIS — Z79899 Other long term (current) drug therapy: Secondary | ICD-10-CM

## 2016-04-15 DIAGNOSIS — Z87442 Personal history of urinary calculi: Secondary | ICD-10-CM | POA: Insufficient documentation

## 2016-04-15 DIAGNOSIS — Z981 Arthrodesis status: Secondary | ICD-10-CM

## 2016-04-15 DIAGNOSIS — G43009 Migraine without aura, not intractable, without status migrainosus: Secondary | ICD-10-CM

## 2016-04-15 DIAGNOSIS — J45998 Other asthma: Secondary | ICD-10-CM | POA: Diagnosis not present

## 2016-04-15 MED ORDER — HYDROCODONE-ACETAMINOPHEN 10-325 MG PO TABS: ORAL_TABLET | ORAL | 0 refills | Status: DC

## 2016-04-15 NOTE — Progress Notes (Signed)
Subjective:    Patient ID: Karina Greer, female    DOB: Dec 02, 1965, 50 y.o.   MRN: [id number]  HPI:  Karina Greer is a 50 year old female who returns for follow up for chronic pain and medication refill. She states her pain is located in her neck, bilateral hands, lower back radiating into her right hip. She rates her pain 8. Her current exercise regime is walking. Ms. Brizendine wearing soft neck brace.  S/p C5C6- C6-C7 Anterior Cervical Discectomy and Fusion, Allograft Plate by Dr. Lorin Mercy.  Pain Inventory Average Pain 7 Pain Right Now 8 My pain is intermittent, sharp, stabbing and tingling  In the last 24 hours, has pain interfered with the following? General activity 8 Relation with others 9 Enjoyment of life 9 What TIME of day is your pain at its worst? daytime evening and night Sleep (in general) Fair  Pain is worse with: walking, bending, sitting, inactivity, standing and some activites Pain improves with: rest, heat/ice, therapy/exercise, pacing activities, medication and injections Relief from Meds: 5  Mobility walk without assistance how many minutes can you walk? 0 right now ability to climb steps?  yes do you drive?  yes  Function disabled: date disabled 2013  Neuro/Psych weakness tingling dizziness anxiety  Prior Studies Any changes since last visit?  yes  Neck surgery  Physicians involved in your care Any changes since last visit?  no   Family History  Problem Relation Age of Onset  . Hypertension Mother   . Diabetes Father   . Fibromyalgia Sister   . Hypertension Other     Cancer, Cerebrovascular disease run on mother side of family   Social History   Social History  . Marital status: Divorced    Spouse name: N/A  . Number of children: 3  . Years of education: HS   Occupational History  . Unemployed Unemployed    Disability   Social History Main Topics  . Smoking status: Never Smoker  . Smokeless tobacco: Never Used  . Alcohol  use No  . Drug use: No  . Sexual activity: Not Asked   Other Topics Concern  . None   Social History Narrative  . None   Past Surgical History:  Procedure Laterality Date  . ACHILLES TENDON SURGERY Right 3/16  . ANTERIOR CERVICAL DECOMP/DISCECTOMY FUSION N/A 03/11/2016   Procedure: C5-6, C6-7 Anterior Cervical Discectomy and Fusion, Allograft, Plate;  Surgeon: Marybelle Killings, MD;  Location: [facility];  Service: Orthopedics;  Laterality: N/A;  . CHOLECYSTECTOMY  2014  . CYSTO WITH HYDRODISTENSION N/A 01/26/2014   Procedure: CYSTOSCOPY/HYDRODISTENSION;  Surgeon: Bernestine Amass, MD;  Location[facility];  Service: Urology;  Laterality: N/A;  . CYSTO/  URETHRAL DILATION/  HYDRODISTENTION/   INSTILLATION THERAPY  07-16-2010//   12-30-2007//   10-27-2006  . EXERCISE TOLERENCE TEST  10-12-2010   NEGATIVE  ADEQUATE ETT/  NO ISCHEMIA OR EVIDENCE HIGH GRADE OBSTRUCTIVE CAD/  NO FURTHER TEST NEEDED  . [place] ENDOMETRIAL ABLATION  2014  . LAPAROSCOPIC OVARIAN CYST BX  2005   AND  URETEROSCOPIC LASER LITHO  STONE EXTRACTION  . SHOULDER OPEN ROTATOR CUFF REPAIR Right 2004  . TONSILLECTOMY    . TRANSTHORACIC ECHOCARDIOGRAM  06-07-2006   normal study/  ef 60-65%  . TUBAL LIGATION Bilateral 1995   Past Medical History:  Diagnosis Date  . Anxiety   . Anxiety disorder   . Arthritis   . Chronic low  back pain   . Depression   . Dyslipidemia   . Fibromyalgia   . GERD (gastroesophageal reflux disease)   . Headache    hx migraines  . History of kidney stones   . History of panic attacks   . History of renal calculi   . IBS (irritable bowel syndrome)   . Interstitial cystitis   . Lupus ([place])    tested positive for antibodies for lupus. dr to do further tests  . Menorrhagia   . OSA on CPAP    not used cpap for several weeks  . Pneumonia    hx  . PONV (postoperative nausea and vomiting)   . RLS (restless legs syndrome)   . Seasonal asthma   . SI (sacroiliac)  joint dysfunction   . SUI (stress urinary incontinence, female)   . UTI (lower urinary tract infection)    hx  . 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 02/23/2013   BP (!) 149/91 (BP Location: Left Arm)   Pulse 70   Temp 98.3 F (36.8 C)   SpO2 98%   Opioid Risk Score:   Fall Risk Score:  `1  Depression screen PHQ 2/9  Depression screen [facility] 2/9 12/12/2015 06/12/2015 04/17/2015 12/07/2014  Decreased Interest 3 3 3 3   Down, Depressed, Hopeless 1 3 2 2   PHQ - 2 Score 4 6 5 5   Altered sleeping 2 - - 3  Tired, decreased energy 3 - - 3  Change in appetite 3 - - 3  Feeling bad or failure about yourself  3 - - 1  Trouble concentrating 3 - - 3  Moving slowly or fidgety/restless 0 - - 1  Suicidal thoughts 0 - - 0  PHQ-9 Score 18 - - 19  Difficult doing work/chores Very difficult - - -    Review of Systems  Respiratory: Positive for apnea and cough.   Gastrointestinal: Positive for nausea.       Objective:   Physical Exam  Constitutional: She is oriented to person, place, and time. She appears well-developed and well-nourished.  HENT:  Head: Normocephalic and atraumatic.  Neck: Normal range of motion. Neck supple.  Cervical Paraspinal Tenderness: C-5- C-6  Cardiovascular: Normal rate and regular rhythm.   Pulmonary/Chest: Effort normal and breath sounds normal.  Musculoskeletal:  Normal Muscle Bulk and Muscle Testing Reveals: Upper Extremities: Full ROM and Muscle Strength 5/5 Left AC Joint Tenderness Lumbar Paraspinal Tenderness: L-4- L-5 Lower Extremities: Full ROM and Muscle Strength 5/5 Arises from chair with ease  Narrow Based Gait  Neurological: She is alert and oriented to person, place, and time.  Skin: Skin is warm and dry.  Psychiatric: She has a normal mood and affect.  Nursing note and vitals reviewed.         Assessment & Plan:  1. Lumbago:  Refilled: HYDROcodone 10/325mg  one tablet every 6 hours as needed #120. Second script given for the following  month. We will continue the opioid monitoring program, this consists of regular clinic visits, examinations, urine drug screen, pill counts as well as use of [place] Controlled Substance Reporting System. 2. Fibromyalgia. Continue Current exercise Regime  3. Anxiety and depression: Dr. Jordan Hawks Psychiatrist Following prescribed Xanax. Continue Counseling at [facility] 4. Migraines: On Maxalt. Neurology Following  5. OSA : Continue exercise regime as tolerated and losing weight.  6. Obesity: Following Healthy Diet Regimen 7. Status Post Cervical Spinal Fusion: C5C6- C-6- C-7 Anterior Cervical Discectomy Fusion and Allograft Plate.: Dr.  Yates Following 8. Muscle Spasm: Continue Tizanidine  20 minutes of face to face patient care time was spent during this visit. All questions were encouraged and answered.

## 2016-04-17 DIAGNOSIS — J3081 Allergic rhinitis due to animal (cat) (dog) hair and dander: Secondary | ICD-10-CM | POA: Diagnosis not present

## 2016-04-17 DIAGNOSIS — J0110 Acute frontal sinusitis, unspecified: Secondary | ICD-10-CM | POA: Diagnosis not present

## 2016-04-17 DIAGNOSIS — J3089 Other allergic rhinitis: Secondary | ICD-10-CM | POA: Diagnosis not present

## 2016-04-17 DIAGNOSIS — G4733 Obstructive sleep apnea (adult) (pediatric): Secondary | ICD-10-CM | POA: Diagnosis not present

## 2016-04-17 DIAGNOSIS — J301 Allergic rhinitis due to pollen: Secondary | ICD-10-CM | POA: Diagnosis not present

## 2016-04-19 DIAGNOSIS — R768 Other specified abnormal immunological findings in serum: Secondary | ICD-10-CM | POA: Diagnosis not present

## 2016-04-19 DIAGNOSIS — M2550 Pain in unspecified joint: Secondary | ICD-10-CM | POA: Diagnosis not present

## 2016-04-19 DIAGNOSIS — M5030 Other cervical disc degeneration, unspecified cervical region: Secondary | ICD-10-CM | POA: Diagnosis not present

## 2016-04-29 ENCOUNTER — Telehealth: Payer: Self-pay | Admitting: Physical Medicine & Rehabilitation

## 2016-04-29 NOTE — Telephone Encounter (Signed)
[facility]  She would like a call at [phone] to let her know why it's not being filled.

## 2016-04-29 NOTE — Telephone Encounter (Signed)
Resolved

## 2016-04-30 DIAGNOSIS — M50222 Other cervical disc displacement at C5-C6 level: Secondary | ICD-10-CM | POA: Diagnosis not present

## 2016-05-06 DIAGNOSIS — L309 Dermatitis, unspecified: Secondary | ICD-10-CM | POA: Diagnosis not present

## 2016-05-06 DIAGNOSIS — J301 Allergic rhinitis due to pollen: Secondary | ICD-10-CM | POA: Diagnosis not present

## 2016-05-06 DIAGNOSIS — L719 Rosacea, unspecified: Secondary | ICD-10-CM | POA: Diagnosis not present

## 2016-05-07 DIAGNOSIS — F331 Major depressive disorder, recurrent, moderate: Secondary | ICD-10-CM | POA: Diagnosis not present

## 2016-05-07 DIAGNOSIS — F411 Generalized anxiety disorder: Secondary | ICD-10-CM | POA: Diagnosis not present

## 2016-05-14 DIAGNOSIS — F331 Major depressive disorder, recurrent, moderate: Secondary | ICD-10-CM | POA: Diagnosis not present

## 2016-05-14 DIAGNOSIS — F411 Generalized anxiety disorder: Secondary | ICD-10-CM | POA: Diagnosis not present

## 2016-05-14 DIAGNOSIS — Z6841 Body Mass Index (BMI) 40.0 and over, adult: Secondary | ICD-10-CM | POA: Diagnosis not present

## 2016-05-14 DIAGNOSIS — N644 Mastodynia: Secondary | ICD-10-CM | POA: Diagnosis not present

## 2016-05-15 ENCOUNTER — Emergency Department: Payer: Medicare Other

## 2016-05-15 ENCOUNTER — Emergency Department
Admission: EM | Admit: 2016-05-15 | Discharge: 2016-05-16 | Disposition: A | Discharge status: Home / Self Care | Payer: Medicare Other | Attending: Emergency Medicine | Admitting: Emergency Medicine

## 2016-05-15 ENCOUNTER — Encounter: Payer: Self-pay | Admitting: Emergency Medicine

## 2016-05-15 DIAGNOSIS — N200 Calculus of kidney: Secondary | ICD-10-CM | POA: Insufficient documentation

## 2016-05-15 DIAGNOSIS — Z79899 Other long term (current) drug therapy: Secondary | ICD-10-CM | POA: Insufficient documentation

## 2016-05-15 DIAGNOSIS — R1030 Lower abdominal pain, unspecified: Secondary | ICD-10-CM | POA: Diagnosis present

## 2016-05-15 DIAGNOSIS — N201 Calculus of ureter: Secondary | ICD-10-CM

## 2016-05-15 DIAGNOSIS — N132 Hydronephrosis with renal and ureteral calculous obstruction: Secondary | ICD-10-CM | POA: Diagnosis not present

## 2016-05-15 LAB — BASIC METABOLIC PANEL
Anion gap: 5 (ref 5–15)
BUN: 11 mg/dL (ref 6–20)
CO2: 27 mmol/L (ref 22–32)
Calcium: 9.1 mg/dL (ref 8.9–10.3)
Chloride: 104 mmol/L (ref 101–111)
Creatinine, Ser: 0.65 mg/dL (ref 0.44–1.00)
GFR calc Af Amer: >60 mL/min (ref >60)
GFR calc non Af Amer: >60 mL/min (ref >60)
Glucose, Bld: 129 mg/dL — ABNORMAL HIGH (ref 65–99)
Potassium: 3.9 mmol/L (ref 3.5–5.1)
Sodium: 136 mmol/L (ref 135–145)

## 2016-05-15 LAB — URINALYSIS COMPLETE WITH MICROSCOPIC (ARMC ONLY)
Bacteria, UA: NONE SEEN
Bilirubin Urine: NEGATIVE
Glucose, UA: NEGATIVE mg/dL
Ketones, ur: NEGATIVE mg/dL
Leukocytes, UA: NEGATIVE
Nitrite: NEGATIVE
Protein, ur: NEGATIVE mg/dL
Specific Gravity, Urine: 1.018 (ref 1.005–1.030)
pH: 5 (ref 5.0–8.0)

## 2016-05-15 LAB — CBC
HCT: 44.6 % (ref 35.0–47.0)
Hemoglobin: 15.2 g/dL (ref 12.0–16.0)
MCH: 28.9 pg (ref 26.0–34.0)
MCHC: 34.1 g/dL (ref 32.0–36.0)
MCV: 84.8 fL (ref 80.0–100.0)
Platelets: 270 10*3/uL (ref 150–440)
RBC: 5.26 MIL/uL — ABNORMAL HIGH (ref 3.80–5.20)
RDW: 13.5 % (ref 11.5–14.5)
WBC: 11.3 10*3/uL — ABNORMAL HIGH (ref 3.6–11.0)

## 2016-05-15 MED ORDER — SODIUM CHLORIDE 0.9 % IV BOLUS (SEPSIS)
500.0000 mL | INTRAVENOUS | Status: AC
  Administered 2016-05-15: 500 mL via INTRAVENOUS

## 2016-05-15 MED ORDER — HYDROMORPHONE HCL 1 MG/ML IJ SOLN
INTRAMUSCULAR | Status: AC
  Administered 2016-05-15: 1 mg via INTRAVENOUS
  Filled 2016-05-15: qty 1

## 2016-05-15 MED ORDER — HYDROMORPHONE HCL 1 MG/ML IJ SOLN
1.0000 mg | Freq: Once | INTRAMUSCULAR | Status: AC
  Administered 2016-05-15: 1 mg via INTRAVENOUS

## 2016-05-15 MED ORDER — KETOROLAC TROMETHAMINE 30 MG/ML IJ SOLN
INTRAMUSCULAR | Status: AC
  Administered 2016-05-15: 15 mg via INTRAVENOUS
  Filled 2016-05-15: qty 1

## 2016-05-15 MED ORDER — TAMSULOSIN HCL 0.4 MG PO CAPS: ORAL_CAPSULE | ORAL | 0 refills | Status: DC

## 2016-05-15 MED ORDER — HYDROMORPHONE HCL 1 MG/ML IJ SOLN
1.0000 mg | INTRAMUSCULAR | Status: AC
  Administered 2016-05-15: 1 mg via INTRAVENOUS
  Filled 2016-05-15: qty 1

## 2016-05-15 MED ORDER — ONDANSETRON HCL 4 MG/2ML IJ SOLN
4.0000 mg | INTRAMUSCULAR | Status: AC
  Administered 2016-05-15: 4 mg via INTRAVENOUS
  Filled 2016-05-15: qty 2

## 2016-05-15 MED ORDER — KETOROLAC TROMETHAMINE 30 MG/ML IJ SOLN
15.0000 mg | Freq: Once | INTRAMUSCULAR | Status: AC
  Administered 2016-05-15: 15 mg via INTRAVENOUS

## 2016-05-15 MED ORDER — LIDOCAINE HCL (CARDIAC) 20 MG/ML IV SOLN
1.5000 mg/kg | Freq: Once | INTRAVENOUS | Status: AC
  Administered 2016-05-15: 156.4 mg via INTRAVENOUS
  Filled 2016-05-15: qty 10

## 2016-05-15 NOTE — ED Provider Notes (Signed)
[facility] Emergency Department Provider Note  ____________________________________________   First MD Initiated Contact with Patient 05/15/16 2128     (approximate)  I have reviewed the triage vital signs and the nursing notes.   HISTORY  Chief Complaint Flank Pain    HPI Karina Greer is a 50 y.o. female with an extensive past medical history that includes several chronic pain syndromes and a history of kidney stones that she states required lithotripsy who presents for evaluation of acute onset right-sided flank pain.He states that it started about 7 hours ago acutely, is sharp and stabbing and radiating around from her right flank to her lower abdomen.  She reports nausea but denies vomiting.  She has not noticed any hematuria and has had no dysuria.  She is very restless and uncomfortable and states that it hurts all the time but sitting down or lying down makes it worse.  She denies fever/chills, chest pain, shortness of breath.  Pain Is severe and feels similar to prior kidney stones but worse.   Past Medical History:  Diagnosis Date  . Anxiety   . Anxiety disorder   . Arthritis   . Chronic low back pain   . Depression   . Dyslipidemia   . Fibromyalgia   . GERD (gastroesophageal reflux disease)   . Headache    hx migraines  . History of kidney stones   . History of panic attacks   . History of renal calculi   . IBS (irritable bowel syndrome)   . Interstitial cystitis   . Lupus ([place])    tested positive for antibodies for lupus. dr to do further tests  . Menorrhagia   . OSA on CPAP    not used cpap for several weeks  . Pneumonia    hx  . PONV (postoperative nausea and vomiting)   . RLS (restless legs syndrome)   . Seasonal asthma   . SI (sacroiliac) joint dysfunction   . SUI (stress urinary incontinence, female)   . UTI (lower urinary tract infection)    hx  . 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 02/23/2013    Patient Active  Problem List   Diagnosis Date Noted  . S/P cervical spinal fusion 03/11/2016  . Anxiety 04/18/2014  . Chronic arthritis 04/18/2014  . Cystitis 04/18/2014  . Extreme obesity ([place]) 04/18/2014  . Fibrositis 04/18/2014  . 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 Cephalalgia 04/04/2014  . Cervical pain 04/04/2014  . Numbness and tingling 04/04/2014  . 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 Other nonspecific abnormal result of function study of brain and central nervous system 07/01/2012  . Disturbance of skin sensation 07/01/2012  . Migraine without aura 07/01/2012  . Lumbar spondylosis 02/04/2012  . Myalgia and myositis 02/04/2012  . Depression 02/04/2012  . OBESITY, UNSPECIFIED 10/03/2010  . GENERALIZED ANXIETY DISORDER 10/03/2010  . CHEST PAIN, PRECORDIAL 10/03/2010    Past Surgical History:  Procedure Laterality Date  . ACHILLES TENDON SURGERY Right 3/16  . ANTERIOR CERVICAL DECOMP/DISCECTOMY FUSION N/A 03/11/2016   Procedure: C5-6, C6-7 Anterior Cervical Discectomy and Fusion, Allograft, Plate;  Surgeon: Marybelle Killings, MD;  Location: [place];  Service: Orthopedics;  Laterality: N/A;  . CHOLECYSTECTOMY  2014  . CYSTO WITH HYDRODISTENSION N/A 01/26/2014   Procedure: CYSTOSCOPY/HYDRODISTENSION;  Surgeon: Bernestine Amass, MD;  Location: [facility];  Service: Urology;  Laterality: N/A;  . CYSTO/  URETHRAL  DILATION/  HYDRODISTENTION/   INSTILLATION THERAPY  07-16-2010//   12-30-2007//   10-27-2006  . EXERCISE TOLERENCE TEST  10-12-2010   NEGATIVE  ADEQUATE ETT/  NO ISCHEMIA OR [place]IDENCE HIGH GRADE OBSTRUCTIVE CAD/  NO FURTHER TEST NEEDED  . [place] ENDOMETRIAL ABLATION  2014  . LAPAROSCOPIC OVARIAN CYST BX  2005   AND  URETEROSCOPIC LASER LITHO  STONE EXTRACTION  . SHOULDER OPEN ROTATOR CUFF REPAIR Right 2004  . TONSILLECTOMY    . TRANSTHORACIC ECHOCARDIOGRAM  06-07-2006   normal  study/  ef 60-65%  . TUBAL LIGATION Bilateral 1995    Prior to Admission medications   Medication Sig Start Date End Date Taking? Authorizing Provider  albuterol (PROVENTIL HFA;VENTOLIN HFA) 108 (90 BASE) MCG/ACT inhaler Inhale 2 puffs into the lungs every 6 (six) hours as needed.     Historical Provider, MD  albuterol (PROVENTIL) (2.5 MG/3ML) 0.083% nebulizer solution INHALE 1 VIAL VIA NEBULIZER EVERY 6 HOURS AS NEEDED 03/18/15   Historical Provider, MD  ALPRAZolam Duanne Moron) 0.5 MG tablet Take 0.5 mg by mouth at bedtime as needed for anxiety.    Historical Provider, MD  b complex vitamins tablet Take 1 tablet by mouth daily.    Historical Provider, MD  cetirizine (ZYRTEC) 10 MG tablet TAKE 1 TABLET BY MOUTH EVERY DAY FOR ALLERGIES 03/14/15   Historical Provider, MD  DEXILANT 30 MG capsule Take 1 capsule by mouth 2 (two) times daily. 02/14/16   Historical Provider, MD  dicyclomine (BENTYL) 10 MG capsule Take 10 mg by mouth 2 (two) times daily. As needed 08/03/12   Historical Provider, MD  EPIPEN 2-PAK 0.3 MG/0.3ML SOAJ injection 0.3 mg See admin instructions.  01/12/15   Historical Provider, MD  fluticasone (FLONASE) 50 MCG/ACT nasal spray Place 1 spray into both nostrils daily as needed for allergies.  02/09/15   Historical Provider, MD  HYDROcodone-acetaminophen (NORCO) 10-325 MG tablet TAKE 1 TABLET BY MOUTH EVERY 6 HOURS AS NEEDED FOR PAIN 04/15/16   Bayard Hugger, NP  meclizine (ANTIVERT) 25 MG tablet Take 1 tablet by mouth 3 (three) times daily as needed. 12/23/13   Historical Provider, MD  montelukast (SINGULAIR) 10 MG tablet Take 10 mg by mouth every morning.    Historical Provider, MD  ondansetron (ZOFRAN-ODT) 4 MG disintegrating tablet Take 1 tablet by mouth 3 (three) times daily as needed. 02/02/14   Historical Provider, MD  PRESCRIPTION MEDICATION every 14 (fourteen) days. 2 ALLERGY INJECTIONS EVERY 2 WKS    Historical Provider, MD  rizatriptan (MAXALT) 10 MG tablet Take 1 tablet (10 mg total)  by mouth 2 (two) times daily as needed. May repeat in 2 hours if needed 02/23/13   Kathrynn Ducking, MD  tamsulosin [facility]) 0.4 MG CAPS capsule Take 1 tablet by mouth daily until you pass the kidney stone or no longer have symptoms 05/15/16   Hinda Kehr, MD  tiZANidine (ZANAFLEX) 2 MG tablet Take 1 tablet (2 mg total) by mouth every 6 (six) hours as needed. 02/23/13   Kathrynn Ducking, MD  Vitamin D, Ergocalciferol, (DRISDOL) 50000 units CAPS capsule Take 50,000 Units by mouth every 7 (seven) days. [place]    Historical Provider, MD    Allergies Alendronate sodium; Milnacipran hcl; Elavil [amitriptyline hcl]; Tramadol; Codeine sulfate; Cymbalta [duloxetine hcl]; Lamotrigine; Morphine; Nsaids; Other; Skelaxin; Tessalon [benzonatate]; Oxycodone; and Oxycodone-acetaminophen  Family History  Problem Relation Age of Onset  . Hypertension Mother   . Diabetes Father   . Fibromyalgia Sister   .  Hypertension Other     Cancer, Cerebrovascular disease run on mother side of family    Social History Social History  Substance Use Topics  . Smoking status: Never Smoker  . Smokeless tobacco: Never Used  . Alcohol use No    Review of Systems Constitutional: No fever/chills Eyes: No visual changes. ENT: No sore throat. Cardiovascular: Denies chest pain. Respiratory: Denies shortness of breath. Gastrointestinal: pain from right flank into right side of abdomen.  Nausea, no vomiting. Genitourinary: Negative for dysuria. Musculoskeletal: Right flank pain radiating to abd Skin: Negative for rash. Neurological: Negative for headaches, focal weakness or numbness.  10-point ROS otherwise negative.  ____________________________________________   PHYSICAL EXAM:  VITAL SIGNS: ED Triage Vitals  Enc Vitals Group     BP 05/15/16 1910 (!) 161/103     Pulse Rate 05/15/16 1910 93     Resp 05/15/16 1910 (!) 22     Temp 05/15/16 1910 97.7 F (36.5 C)     Temp Source 05/15/16 1910 Oral     SpO2  05/15/16 1910 100 %     Weight 05/15/16 1911 230 lb (104.3 kg)     Height 05/15/16 1911 5\' 3"  (1.6 m)     Head Circumference --      Peak Flow --      Pain Score 05/15/16 1911 10     Pain Loc --      Pain Edu? --      Excl. in [place]? --     Constitutional: Alert and oriented. Well appearing and in no acute distress. Eyes: Conjunctivae are normal. PERRL. EOMI. Head: Atraumatic. Nose: No congestion/rhinnorhea. Mouth/Throat: Mucous membranes are moist.  Oropharynx non-erythematous. Neck: No stridor.  No meningeal signs.   Cardiovascular: Normal rate, regular rhythm. Good peripheral circulation. Grossly normal heart sounds. Respiratory: Normal respiratory effort.  No retractions. Lungs CTAB. Gastrointestinal: Soft with diffuse right sided tenderness. Musculoskeletal: No lower extremity tenderness nor edema. No gross deformities of extremities. Right CVA tenderness Neurologic:  Normal speech and language. No gross focal neurologic deficits are appreciated.  Skin:  Skin is warm, dry and intact. No rash noted. Psychiatric: Mood and affect are anxious. Speech and behavior are normal.  ____________________________________________   LABS (all labs ordered are listed, but only abnormal results are displayed)  Labs Reviewed  URINALYSIS COMPLETEWITH MICROSCOPIC ([place]) - Abnormal; Notable for the following:       Result Value   Color, Urine YELLOW (*)    APPearance CLEAR (*)    Hgb urine dipstick 3+ (*)    Squamous Epithelial / LPF 0-5 (*)    All other components within normal limits  BASIC METABOLIC PANEL - Abnormal; Notable for the following:    Glucose, Bld 129 (*)    All other components within normal limits  CBC - Abnormal; Notable for the following:    WBC 11.3 (*)    RBC 5.26 (*)    All other components within normal limits  URINE CULTURE  PREGNANCY, URINE   ____________________________________________  EKG  None - EKG not ordered by ED  physician ____________________________________________  RADIOLOGY   Ct Renal Stone Study  Result Date: 05/15/2016 CLINICAL DATA:  Right flank pain EXAM: CT ABDOMEN AND PELVIS WITHOUT CONTRAST TECHNIQUE: Multidetector CT imaging of the abdomen and pelvis was performed following the standard protocol without IV contrast. COMPARISON:  CT abdomen pelvis dated 05/14/2012 FINDINGS: Lower chest:  Mild linear scarring/ atelectasis in the lingula. Hepatobiliary: Liver is within normal limits. Status post  cholecystectomy. No intrahepatic or extrahepatic ductal dilatation. Pancreas: Within normal limits. Spleen: Within normal limits. Adrenals/Urinary Tract: Adrenal glands are within normal limits. Left kidney is within normal limits. Right kidney is notable for mild right hydroureteronephrosis. Associated 5 mm distal right ureteral calculus at the UVJ. Bladder is underdistended but unremarkable. Stomach/Bowel: Stomach is within normal limits. No evidence of bowel obstruction. Normal appendix (series 2/image 59). Vascular/Lymphatic: No evidence of abdominal aortic aneurysm. No suspicious abdominopelvic lymphadenopathy. Reproductive: Uterus is within normal limits. Bilateral ovaries are within normal limits, noting a 2.4 cm left ovarian cyst/follicle, physiologic. Other: No abdominopelvic ascites. Tiny fat containing left inguinal hernia (series 2/image 79). Musculoskeletal: Mild degenerative changes of the lumbar spine. IMPRESSION: 5 mm distal right ureteral calculus at the UVJ. Associated mild right hydroureteronephrosis. Electronically Signed   By: Julian Hy M.D.   On: 05/15/2016 22:24    ____________________________________________   PROCEDURES  Procedure(s) performed:   Procedures   Critical Care performed: No ____________________________________________   INITIAL IMPRESSION / ASSESSMENT AND PLAN / ED COURSE  Pertinent labs & imaging results that were available during my care of the  patient were reviewed by me and considered in my medical decision making (see chart for details).  The patient appears extremely uncomfortable and she has some hemoglobin in her urine.  I suspect she has a kidney stone.  Her labs are otherwise reassuring and her vital signs are stable.  I will give her Dilaudid and Zofran and obtain a CT renal stone protocol.  She has extensive allergies listed and I checked the [place] controlled substance database and she gets monthly prescriptions for hydrocodone-acetaminophen 10-325.  I believe that the benefit of the narcotics in her present condition outweighs any possible risk of allergic or adverse reaction.   Clinical Course  Comment By Time  No evidence of UTI.  Joaquim Lai is of a size that should pass on its own, but the patient continues to have 5/10 pain after multiple rounds of Dilaudid and one round of Toradol.  I will administer Lidocaine 1.5 mg /kg IV over 15 minutes, which has proven to be effective for refractory renal colic.  Transferring ED care to Dr. Owens Shark to follow up - hopefully pain will be adequately controlled for outpatient follow up. Hinda Kehr, MD 08/23 2354    ____________________________________________  FINAL CLINICAL IMPRESSION(S) / ED DIAGNOSES  Final diagnoses:  Right ureteral stone  Right kidney stone     MEDICATIONS GIVEN DURING THIS VISIT:  Medications  HYDROmorphone (DILAUDID) injection 1 mg (1 mg Intravenous Given 05/15/16 2144)  ondansetron (ZOFRAN) injection 4 mg (4 mg Intravenous Given 05/15/16 2144)  sodium chloride 0.9 % bolus 500 mL (0 mLs Intravenous Stopped 05/15/16 2229)  HYDROmorphone (DILAUDID) injection 1 mg (1 mg Intravenous Given 05/15/16 2227)  ketorolac (TORADOL) 30 MG/ML injection 15 mg (15 mg Intravenous Given 05/15/16 2315)  lidocaine (cardiac) 100 mg/23ml (XYLOCAINE) 20 MG/ML injection 2% 156.4 mg (156.4 mg Intravenous Given 05/15/16 2358)  ondansetron (ZOFRAN) injection 4 mg (4 mg Intravenous  Given 05/16/16 0028)     NEW OUTPATIENT MEDICATIONS STARTED DURING THIS VISIT:  New Prescriptions   TAMSULOSIN (FLOMAX) 0.4 MG CAPS CAPSULE    Take 1 tablet by mouth daily until you pass the kidney stone or no longer have symptoms      Note:  This document was prepared using Dragon voice recognition software and may include unintentional dictation errors.    Hinda Kehr, MD 05/16/16 [phone]

## 2016-05-15 NOTE — ED Triage Notes (Signed)
Pt ambulatory to triage, reports right flank pain radiating to lower abd since 2:30pm, reports nausea, denies dysuria, pt restless in triage

## 2016-05-15 NOTE — Discharge Instructions (Addendum)
You have been seen in the Emergency Department (ED) today for pain that we believe based on your workup, is caused by kidney stones.  As we have discussed, please drink plenty of fluids.  Please make a follow up appointment with the physician(s) listed elsewhere in this documentation.  You may take pain medication as needed but ONLY as prescribed.  Please also take your prescribed Flomax daily.  We also recommend that you take over-the-counter ibuprofen regularly according to label instructions over the next 5 days.  Take it with meals to minimize stomach discomfort.  Please see your doctor as soon as possible as stones may take 1-3 weeks to pass and you may require additional care or medications.  Do not drink alcohol, drive or participate in any other potentially dangerous activities while taking opiate pain medication as it may make you sleepy. Do not take this medication with any other sedating medications, either prescription or over-the-counter. If you were prescribed Percocet or Vicodin, do not take these with acetaminophen (Tylenol) as it is already contained within these medications.   Take your regular pain medication as needed for severe pain.  This medication is an opiate (or narcotic) pain medication and can be habit forming.  Use it as little as possible to achieve adequate pain control.  Do not use or use it with extreme caution if you have a history of opiate abuse or dependence.  If you are on a pain contract with your primary care doctor or a pain specialist, be sure to let them know you were prescribed this medication today from the [facility] Emergency Department.  This medication is intended for your use only - do not give any to anyone else and keep it in a secure place where nobody else, especially children, have access to it.  It will also cause or worsen constipation, so you may want to consider taking an over-the-counter stool softener while you are taking this  medication.  Return to the Emergency Department (ED) or call your doctor if you have any worsening pain, fever, painful urination, are unable to urinate, or develop other symptoms that concern you.

## 2016-05-16 DIAGNOSIS — N200 Calculus of kidney: Secondary | ICD-10-CM | POA: Diagnosis not present

## 2016-05-16 DIAGNOSIS — N201 Calculus of ureter: Secondary | ICD-10-CM | POA: Diagnosis not present

## 2016-05-16 LAB — PREGNANCY, URINE: Preg Test, Ur: NEGATIVE

## 2016-05-16 MED ORDER — ONDANSETRON HCL 4 MG/2ML IJ SOLN
4.0000 mg | Freq: Once | INTRAMUSCULAR | Status: AC
  Administered 2016-05-16: 4 mg via INTRAVENOUS

## 2016-05-16 MED ORDER — ONDANSETRON HCL 4 MG/2ML IJ SOLN
INTRAMUSCULAR | Status: AC
  Administered 2016-05-16: 4 mg via INTRAVENOUS
  Filled 2016-05-16: qty 2

## 2016-05-17 LAB — URINE CULTURE
Culture: <10000 — AB
Special Requests: NORMAL

## 2016-05-21 DIAGNOSIS — N3010 Interstitial cystitis (chronic) without hematuria: Secondary | ICD-10-CM | POA: Diagnosis not present

## 2016-05-21 DIAGNOSIS — N201 Calculus of ureter: Secondary | ICD-10-CM | POA: Diagnosis not present

## 2016-05-23 DIAGNOSIS — Z23 Encounter for immunization: Secondary | ICD-10-CM | POA: Diagnosis not present

## 2016-06-03 ENCOUNTER — Telehealth: Payer: Self-pay | Admitting: *Deleted

## 2016-06-03 NOTE — Telephone Encounter (Signed)
Prior authorization submitted to [employer] for hydrocodone-acetaminophen 10-325mg 

## 2016-06-04 ENCOUNTER — Telehealth: Payer: Self-pay | Admitting: Physical Medicine & Rehabilitation

## 2016-06-04 NOTE — Telephone Encounter (Signed)
Patient has been told by Inez Catalina at [facility] that Dr. Naaman Plummer DEA number is not good.  Please call patient when this is cleared up.

## 2016-06-05 DIAGNOSIS — F411 Generalized anxiety disorder: Secondary | ICD-10-CM | POA: Diagnosis not present

## 2016-06-05 DIAGNOSIS — F331 Major depressive disorder, recurrent, moderate: Secondary | ICD-10-CM | POA: Diagnosis not present

## 2016-06-05 NOTE — Telephone Encounter (Signed)
Contacted [facility]. Informed pharmacist that Danella Sensing, NP was the prescribing provider. Dr. Naaman Plummer is the supervising providor. Asked if she could submit RX with Eunice's DEA number. Rx went through and was filled

## 2016-06-06 DIAGNOSIS — J3081 Allergic rhinitis due to animal (cat) (dog) hair and dander: Secondary | ICD-10-CM | POA: Diagnosis not present

## 2016-06-06 DIAGNOSIS — J3089 Other allergic rhinitis: Secondary | ICD-10-CM | POA: Diagnosis not present

## 2016-06-06 DIAGNOSIS — J301 Allergic rhinitis due to pollen: Secondary | ICD-10-CM | POA: Diagnosis not present

## 2016-06-17 ENCOUNTER — Encounter: Payer: Medicare Other | Attending: Physical Medicine & Rehabilitation | Admitting: Registered Nurse

## 2016-06-17 ENCOUNTER — Encounter: Payer: Self-pay | Admitting: Registered Nurse

## 2016-06-17 ENCOUNTER — Telehealth: Payer: Self-pay | Admitting: Physical Medicine & Rehabilitation

## 2016-06-17 VITALS — BP 121/84 | HR 79 | Resp 14

## 2016-06-17 DIAGNOSIS — K589 Irritable bowel syndrome without diarrhea: Secondary | ICD-10-CM | POA: Diagnosis not present

## 2016-06-17 DIAGNOSIS — J45998 Other asthma: Secondary | ICD-10-CM | POA: Diagnosis not present

## 2016-06-17 DIAGNOSIS — G43909 Migraine, unspecified, not intractable, without status migrainosus: Secondary | ICD-10-CM | POA: Insufficient documentation

## 2016-06-17 DIAGNOSIS — N920 Excessive and frequent menstruation with regular cycle: Secondary | ICD-10-CM | POA: Diagnosis not present

## 2016-06-17 DIAGNOSIS — K219 Gastro-esophageal reflux disease without esophagitis: Secondary | ICD-10-CM | POA: Diagnosis not present

## 2016-06-17 DIAGNOSIS — N3010 Interstitial cystitis (chronic) without hematuria: Secondary | ICD-10-CM | POA: Insufficient documentation

## 2016-06-17 DIAGNOSIS — Z87442 Personal history of urinary calculi: Secondary | ICD-10-CM | POA: Insufficient documentation

## 2016-06-17 DIAGNOSIS — IMO0001 Reserved for inherently not codable concepts without codable children: Secondary | ICD-10-CM

## 2016-06-17 DIAGNOSIS — M797 Fibromyalgia: Secondary | ICD-10-CM | POA: Insufficient documentation

## 2016-06-17 DIAGNOSIS — E669 Obesity, unspecified: Secondary | ICD-10-CM | POA: Insufficient documentation

## 2016-06-17 DIAGNOSIS — F419 Anxiety disorder, unspecified: Secondary | ICD-10-CM

## 2016-06-17 DIAGNOSIS — E785 Hyperlipidemia, unspecified: Secondary | ICD-10-CM | POA: Insufficient documentation

## 2016-06-17 DIAGNOSIS — G894 Chronic pain syndrome: Secondary | ICD-10-CM

## 2016-06-17 DIAGNOSIS — M791 Myalgia: Secondary | ICD-10-CM

## 2016-06-17 DIAGNOSIS — Z760 Encounter for issue of repeat prescription: Secondary | ICD-10-CM | POA: Insufficient documentation

## 2016-06-17 DIAGNOSIS — G8929 Other chronic pain: Secondary | ICD-10-CM | POA: Insufficient documentation

## 2016-06-17 DIAGNOSIS — M1990 Unspecified osteoarthritis, unspecified site: Secondary | ICD-10-CM | POA: Insufficient documentation

## 2016-06-17 DIAGNOSIS — M47816 Spondylosis without myelopathy or radiculopathy, lumbar region: Secondary | ICD-10-CM

## 2016-06-17 DIAGNOSIS — G4733 Obstructive sleep apnea (adult) (pediatric): Secondary | ICD-10-CM | POA: Insufficient documentation

## 2016-06-17 DIAGNOSIS — G2581 Restless legs syndrome: Secondary | ICD-10-CM | POA: Diagnosis not present

## 2016-06-17 DIAGNOSIS — M25551 Pain in right hip: Secondary | ICD-10-CM | POA: Diagnosis not present

## 2016-06-17 DIAGNOSIS — M545 Low back pain: Secondary | ICD-10-CM | POA: Diagnosis not present

## 2016-06-17 DIAGNOSIS — M5416 Radiculopathy, lumbar region: Secondary | ICD-10-CM

## 2016-06-17 DIAGNOSIS — F418 Other specified anxiety disorders: Secondary | ICD-10-CM | POA: Diagnosis not present

## 2016-06-17 DIAGNOSIS — M79671 Pain in right foot: Secondary | ICD-10-CM | POA: Insufficient documentation

## 2016-06-17 DIAGNOSIS — Z5181 Encounter for therapeutic drug level monitoring: Secondary | ICD-10-CM

## 2016-06-17 DIAGNOSIS — Z79899 Other long term (current) drug therapy: Secondary | ICD-10-CM | POA: Diagnosis not present

## 2016-06-17 DIAGNOSIS — N393 Stress incontinence (female) (male): Secondary | ICD-10-CM | POA: Insufficient documentation

## 2016-06-17 DIAGNOSIS — M62838 Other muscle spasm: Secondary | ICD-10-CM

## 2016-06-17 DIAGNOSIS — M5417 Radiculopathy, lumbosacral region: Secondary | ICD-10-CM

## 2016-06-17 DIAGNOSIS — M609 Myositis, unspecified: Secondary | ICD-10-CM

## 2016-06-17 MED ORDER — HYDROCODONE-ACETAMINOPHEN 10-325 MG PO TABS: ORAL_TABLET | ORAL | 0 refills | Status: DC

## 2016-06-17 NOTE — Telephone Encounter (Signed)
Patient was in office today, but when she went to pharmacy they told her with Medicaid she has to get prior approval every time she gets her medications filled now.  She will need them again in October and she wants to know should she call a week in advance so that way it will be approved before she goes to pharmacy?  Please call patient.

## 2016-06-17 NOTE — Progress Notes (Signed)
Subjective:    Patient ID: Karina Greer, female    DOB: Oct 03, 1965, 50 y.o.   MRN: [id number]  HPI: Ms. Karina GEISSINGER is a 50 year old female who returns for follow up for chronic pain and medication refill. She states her pain is located in her lower back radiating into her right hip. She rates her pain 8. Her current exercise regime is walking and performing stretching exercises.  [name] was at [facility] on 05/15/16 with complaint of Flank Pain, CT Renal Stone Study Performed 87mm distal right ureteral calculus noted. She received IV Dilaudid, Toradol and Zofran Urology Following.  [name] returned a script written by Dr. Lorin Mercy on 03/12/16, prescription discarded.  S/p C5C6- C6-C7 Anterior Cervical Discectomy and Fusion, Allograft Plate by Dr. Lorin Mercy.   Pain Inventory Average Pain 8 Pain Right Now 8 My pain is constant, sharp and aching  In the last 24 hours, has pain interfered with the following? General activity 8 Relation with others 8 Enjoyment of life 8 What TIME of day is your pain at its worst? all Sleep (in general) Fair  Pain is worse with: walking, bending, sitting, inactivity, standing and some activites Pain improves with: rest, heat/ice, therapy/exercise, pacing activities, medication and TENS Relief from Meds: 6  Mobility how many minutes can you walk? 15 ability to climb steps?  yes do you drive?  yes Do you have any goals in this area?  yes  Function disabled: date disabled . I need assistance with the following:  meal prep, household duties and shopping Do you have any goals in this area?  yes  Neuro/Psych spasms dizziness anxiety  Prior Studies Any changes since last visit?  no  Physicians involved in your care Any changes since last visit?  no   Family History  Problem Relation Age of Onset  . Hypertension Mother   . Diabetes Father   . Fibromyalgia Sister   . Hypertension Other     Cancer,  Cerebrovascular disease run on mother side of family   Social History   Social History  . Marital status: Divorced    Spouse name: N/A  . Number of children: 3  . Years of education: HS   Occupational History  . Unemployed Unemployed    Disability   Social History Main Topics  . Smoking status: Never Smoker  . Smokeless tobacco: Never Used  . Alcohol use No  . Drug use: No  . Sexual activity: Not Asked   Other Topics Concern  . None   Social History Narrative  . None   Past Surgical History:  Procedure Laterality Date  . ACHILLES TENDON SURGERY Right 3/16  . ANTERIOR CERVICAL DECOMP/DISCECTOMY FUSION N/A 03/11/2016   Procedure: C5-6, C6-7 Anterior Cervical Discectomy and Fusion, Allograft, Plate;  Surgeon: Marybelle Killings, MD;  Location: [place];  Service: Orthopedics;  Laterality: N/A;  . CHOLECYSTECTOMY  2014  . CYSTO WITH HYDRODISTENSION N/A 01/26/2014   Procedure: CYSTOSCOPY/HYDRODISTENSION;  Surgeon: Bernestine Amass, MD;  Location: [facility];  Service: Urology;  Laterality: N/A;  . CYSTO/  URETHRAL DILATION/  HYDRODISTENTION/   INSTILLATION THERAPY  07-16-2010//   12-30-2007//   10-27-2006  . EXERCISE TOLERENCE TEST  10-12-2010   NEGATIVE  ADEQUATE ETT/  NO ISCHEMIA OR EVIDENCE HIGH GRADE OBSTRUCTIVE CAD/  NO FURTHER TEST NEEDED  . [place] ENDOMETRIAL ABLATION  2014  . LAPAROSCOPIC OVARIAN CYST BX  2005   AND  URETEROSCOPIC LASER LITHO  STONE EXTRACTION  . SHOULDER OPEN ROTATOR CUFF REPAIR Right 2004  . TONSILLECTOMY    . TRANSTHORACIC ECHOCARDIOGRAM  06-07-2006   normal study/  ef 60-65%  . TUBAL LIGATION Bilateral 1995   Past Medical History:  Diagnosis Date  . Anxiety   . Anxiety disorder   . Arthritis   . Chronic low back pain   . Depression   . Dyslipidemia   . Fibromyalgia   . GERD (gastroesophageal reflux disease)   . Headache    hx migraines  . History of kidney stones   . History of panic attacks   . History of renal  calculi   . IBS (irritable bowel syndrome)   . Interstitial cystitis   . Lupus ([place])    tested positive for antibodies for lupus. dr to do further tests  . Menorrhagia   . OSA on CPAP    not used cpap for several weeks  . Pneumonia    hx  . PONV (postoperative nausea and vomiting)   . RLS (restless legs syndrome)   . Seasonal asthma   . SI (sacroiliac) joint dysfunction   . SUI (stress urinary incontinence, female)   . UTI (lower urinary tract infection)    hx  . 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 02/23/2013   BP 121/84 (BP Location: Left Arm, Patient Position: Sitting, Cuff Size: Large)   Pulse 79   Resp 14   SpO2 98%   Opioid Risk Score:   Fall Risk Score:  `1  Depression screen PHQ 2/9  Depression screen [facility] 2/9 12/12/2015 06/12/2015 04/17/2015 12/07/2014  Decreased Interest 3 3 3 3   Down, Depressed, Hopeless 1 3 2 2   PHQ - 2 Score 4 6 5 5   Altered sleeping 2 - - 3  Tired, decreased energy 3 - - 3  Change in appetite 3 - - 3  Feeling bad or failure about yourself  3 - - 1  Trouble concentrating 3 - - 3  Moving slowly or fidgety/restless 0 - - 1  Suicidal thoughts 0 - - 0  PHQ-9 Score 18 - - 19  Difficult doing work/chores Very difficult - - -    Review of Systems  Constitutional: Positive for appetite change.  HENT: Negative.   Eyes: Negative.   Respiratory: Positive for apnea.   Cardiovascular: Positive for leg swelling.  Gastrointestinal: Positive for nausea.  Endocrine: Negative.   Musculoskeletal: Positive for arthralgias, back pain, myalgias and neck pain.       Spasms  Neurological: Positive for dizziness.  Hematological: Negative.   Psychiatric/Behavioral: The patient is nervous/anxious.   All other systems reviewed and are negative.      Objective:   Physical Exam  Constitutional: She is oriented to person, place, and time. She appears well-developed and well-nourished.  HENT:  Head: Normocephalic and atraumatic.  Neck: Normal range of  motion. Neck supple.  Cardiovascular: Normal rate and regular rhythm.   Pulmonary/Chest: Effort normal and breath sounds normal.  Musculoskeletal:  Normal Muscle Bulk and Muscle Testing Reveals:  Upper Extremities: Full ROM and Muscle Strength 5/5 Lumbar Paraspinal Tenderness: L-3- L-5 Lower Extremities: Full ROM and Muscle Strength 5/5 Bilateral Lower Extremities Flexion Produces Pain into Lumbar and Right Hip Arises from Table with ease Narrow Based Gait  Neurological: She is alert and oriented to person, place, and time.  Skin: Skin is warm and dry.  Psychiatric: She has a normal mood and affect.  Nursing note and  vitals reviewed.         Assessment & Plan:  1. Lumbago:  Refilled: HYDROcodone 10/325mg  one tablet every 6 hours as needed #120. Second script given for the following month. We will continue the opioid monitoring program, this consists of regular clinic visits, examinations, urine drug screen, pill counts as well as use of [place] Controlled Substance Reporting System. 2. Fibromyalgia. Continue Current exercise Regime  3. Anxiety and depression: Dr. Jordan Hawks Psychiatrist Following prescribed Xanax. Continue Counseling at [facility] 4. Migraines: On Maxalt. Neurology Following  5. OSA : Continue exercise regime as tolerated and losing weight.  6. Obesity: Following Healthy Diet Regimen 7. Status Post Cervical Spinal Fusion: C5C6- C-6- C-7 Anterior Cervical Discectomy Fusion and Allograft Plate.: Dr. Lorin Mercy Following 8. Muscle Spasm: Continue Tizanidine  20 minutes of face to face patient care time was spent during this visit. All questions were encouraged and answered.   F/U in 2 months

## 2016-06-19 DIAGNOSIS — Z6839 Body mass index (BMI) 39.0-39.9, adult: Secondary | ICD-10-CM | POA: Diagnosis not present

## 2016-06-19 DIAGNOSIS — J069 Acute upper respiratory infection, unspecified: Secondary | ICD-10-CM | POA: Diagnosis not present

## 2016-06-24 LAB — TOXASSURE SELECT,+ANTIDEPR,UR

## 2016-06-24 NOTE — Progress Notes (Signed)
Urine drug screen for this encounter is consistent for prescribed medication 

## 2016-06-26 DIAGNOSIS — J3081 Allergic rhinitis due to animal (cat) (dog) hair and dander: Secondary | ICD-10-CM | POA: Diagnosis not present

## 2016-06-26 DIAGNOSIS — J3089 Other allergic rhinitis: Secondary | ICD-10-CM | POA: Diagnosis not present

## 2016-06-26 DIAGNOSIS — J301 Allergic rhinitis due to pollen: Secondary | ICD-10-CM | POA: Diagnosis not present

## 2016-06-26 NOTE — Telephone Encounter (Addendum)
I spoke with pharmacist and he is not aware of this being an issue. I spoke with Lamaiya and she is to bring her MEDICARE part D info to next appt.

## 2016-06-28 DIAGNOSIS — Z6841 Body Mass Index (BMI) 40.0 and over, adult: Secondary | ICD-10-CM | POA: Diagnosis not present

## 2016-06-28 DIAGNOSIS — K121 Other forms of stomatitis: Secondary | ICD-10-CM | POA: Diagnosis not present

## 2016-06-28 DIAGNOSIS — J019 Acute sinusitis, unspecified: Secondary | ICD-10-CM | POA: Diagnosis not present

## 2016-07-10 DIAGNOSIS — J3089 Other allergic rhinitis: Secondary | ICD-10-CM | POA: Diagnosis not present

## 2016-07-10 DIAGNOSIS — J3081 Allergic rhinitis due to animal (cat) (dog) hair and dander: Secondary | ICD-10-CM | POA: Diagnosis not present

## 2016-07-10 DIAGNOSIS — J301 Allergic rhinitis due to pollen: Secondary | ICD-10-CM | POA: Diagnosis not present

## 2016-07-11 DIAGNOSIS — T63481A Toxic effect of venom of other arthropod, accidental (unintentional), initial encounter: Secondary | ICD-10-CM | POA: Diagnosis not present

## 2016-07-11 DIAGNOSIS — Z6841 Body Mass Index (BMI) 40.0 and over, adult: Secondary | ICD-10-CM | POA: Diagnosis not present

## 2016-07-15 DIAGNOSIS — J301 Allergic rhinitis due to pollen: Secondary | ICD-10-CM | POA: Diagnosis not present

## 2016-07-15 DIAGNOSIS — J3081 Allergic rhinitis due to animal (cat) (dog) hair and dander: Secondary | ICD-10-CM | POA: Diagnosis not present

## 2016-07-15 DIAGNOSIS — J3089 Other allergic rhinitis: Secondary | ICD-10-CM | POA: Diagnosis not present

## 2016-07-24 ENCOUNTER — Ambulatory Visit
Admission: RE | Admit: 2016-07-24 | Discharge: 2016-07-24 | Disposition: A | Discharge status: Home / Self Care | Payer: Medicare Other | Source: Ambulatory Visit | Attending: Internal Medicine | Admitting: Internal Medicine

## 2016-07-24 ENCOUNTER — Other Ambulatory Visit: Payer: Self-pay | Admitting: Internal Medicine

## 2016-07-24 DIAGNOSIS — R52 Pain, unspecified: Secondary | ICD-10-CM

## 2016-07-24 DIAGNOSIS — J3089 Other allergic rhinitis: Secondary | ICD-10-CM | POA: Diagnosis not present

## 2016-07-24 DIAGNOSIS — J301 Allergic rhinitis due to pollen: Secondary | ICD-10-CM | POA: Diagnosis not present

## 2016-07-24 DIAGNOSIS — R079 Chest pain, unspecified: Secondary | ICD-10-CM | POA: Insufficient documentation

## 2016-07-24 DIAGNOSIS — G4733 Obstructive sleep apnea (adult) (pediatric): Secondary | ICD-10-CM | POA: Diagnosis not present

## 2016-07-25 DIAGNOSIS — F411 Generalized anxiety disorder: Secondary | ICD-10-CM | POA: Diagnosis not present

## 2016-07-25 DIAGNOSIS — F331 Major depressive disorder, recurrent, moderate: Secondary | ICD-10-CM | POA: Diagnosis not present

## 2016-07-29 DIAGNOSIS — N911 Secondary amenorrhea: Secondary | ICD-10-CM | POA: Diagnosis not present

## 2016-07-29 DIAGNOSIS — R079 Chest pain, unspecified: Secondary | ICD-10-CM | POA: Diagnosis not present

## 2016-07-29 DIAGNOSIS — F411 Generalized anxiety disorder: Secondary | ICD-10-CM | POA: Diagnosis not present

## 2016-07-29 DIAGNOSIS — M79602 Pain in left arm: Secondary | ICD-10-CM | POA: Diagnosis not present

## 2016-07-29 DIAGNOSIS — M940 Chondrocostal junction syndrome [Tietze]: Secondary | ICD-10-CM | POA: Diagnosis not present

## 2016-07-29 DIAGNOSIS — F331 Major depressive disorder, recurrent, moderate: Secondary | ICD-10-CM | POA: Diagnosis not present

## 2016-07-30 DIAGNOSIS — N911 Secondary amenorrhea: Secondary | ICD-10-CM | POA: Diagnosis not present

## 2016-07-31 DIAGNOSIS — F331 Major depressive disorder, recurrent, moderate: Secondary | ICD-10-CM | POA: Diagnosis not present

## 2016-07-31 DIAGNOSIS — F411 Generalized anxiety disorder: Secondary | ICD-10-CM | POA: Diagnosis not present

## 2016-08-02 DIAGNOSIS — R079 Chest pain, unspecified: Secondary | ICD-10-CM | POA: Diagnosis not present

## 2016-08-07 DIAGNOSIS — J301 Allergic rhinitis due to pollen: Secondary | ICD-10-CM | POA: Diagnosis not present

## 2016-08-07 DIAGNOSIS — J3089 Other allergic rhinitis: Secondary | ICD-10-CM | POA: Diagnosis not present

## 2016-08-07 IMAGING — MR MR ANKLE*R* W/O CM
4 of 7 series · 19 of 40 positions shown · non-contrast
Comparison: None.

CLINICAL DATA: Posterior heel and lateral ankle pain for 6 months
beginning after walking. No acute injury or prior relevant surgery.
Initial encounter.

EXAM:
MRI OF THE RIGHT ANKLE WITHOUT CONTRAST
TECHNIQUE: Multiplanar, multisequence MR imaging of the ankle was performed. No
intravenous contrast was administered.

[Series 3: T1 · axial · 4.0mm · 0.27mm/px · z∈[-87,+21]mm · 3 of 36 slices shown]
[im 6/36]
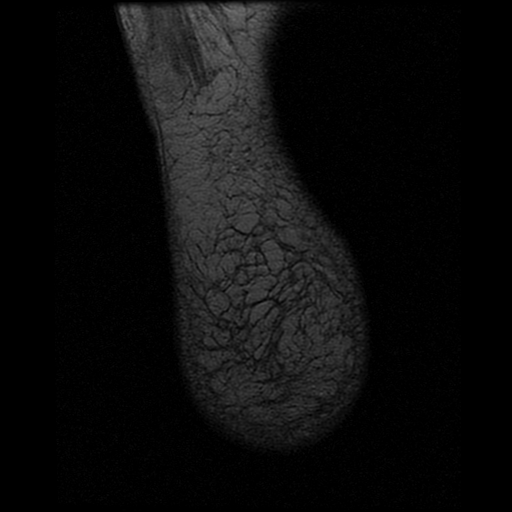
[im 18/36]
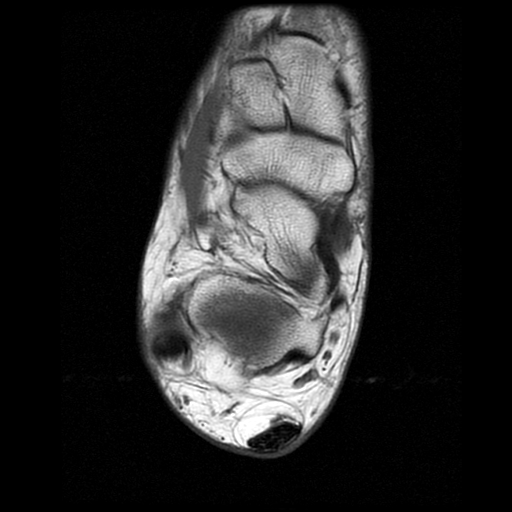
[im 30/36]
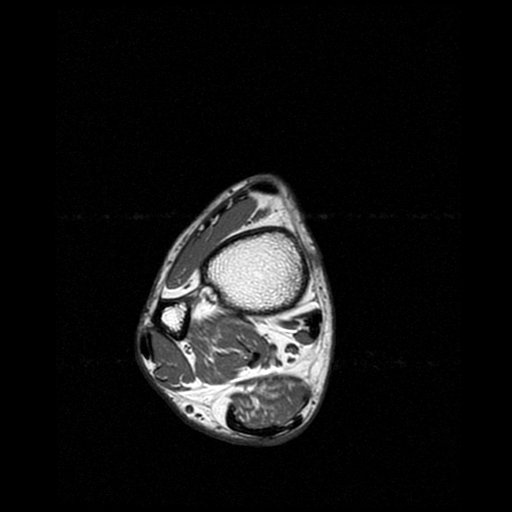

[Series 5: T2 fat-sat · coronal · 3.0mm · 0.29mm/px · 6 of 36 slices shown (1 of 2)]
[im 1/36]
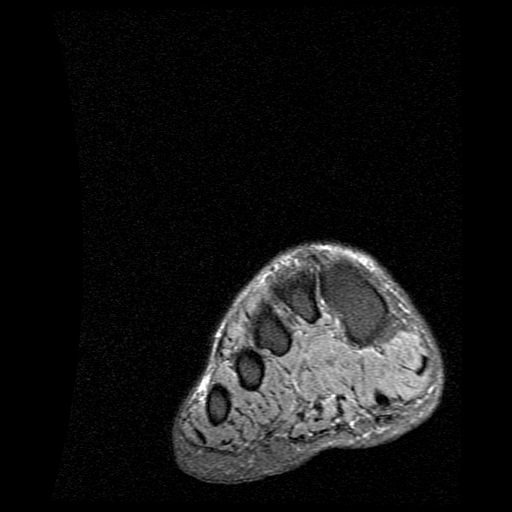
[im 6/36]
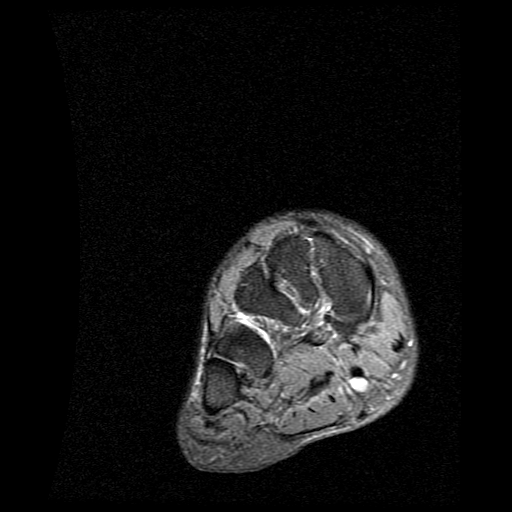
[im 12/36]
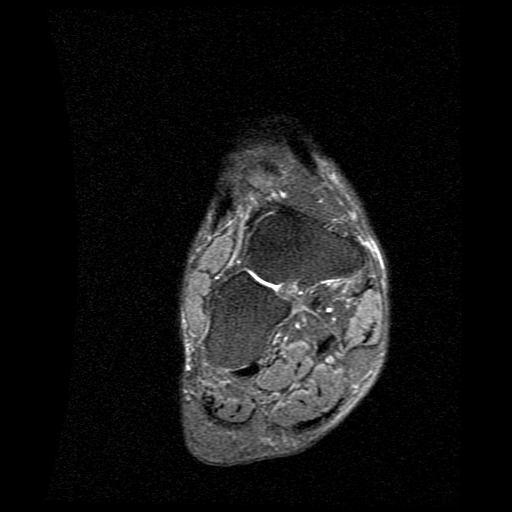
[im 18/36]
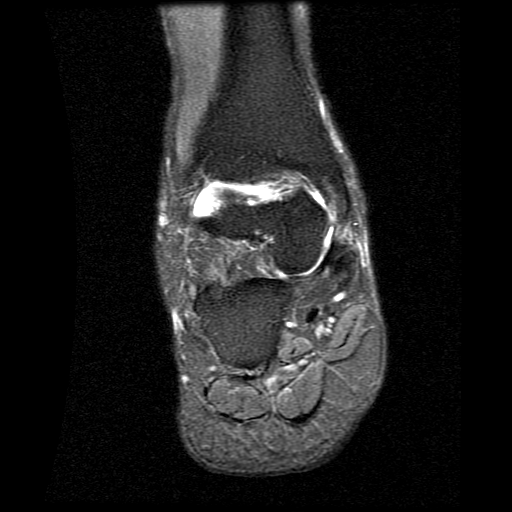
[im 24/36]
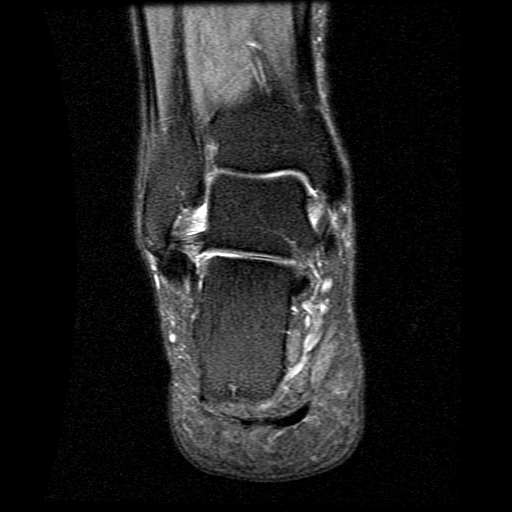
[im 30/36]
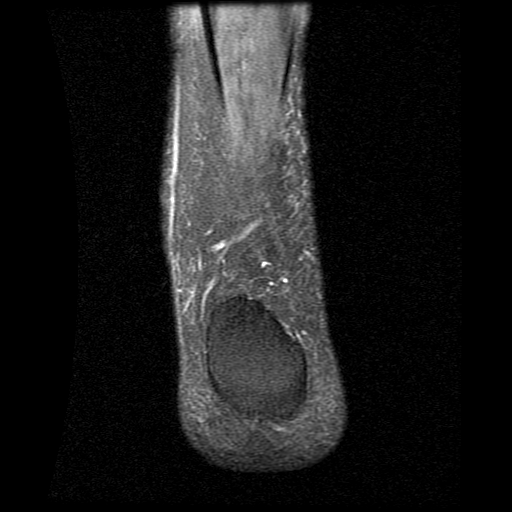

[Series 8: PD · axial · 4.0mm · 0.27mm/px · z∈[-109,+48]mm · 7 of 36 slices shown]
[im 1/36]
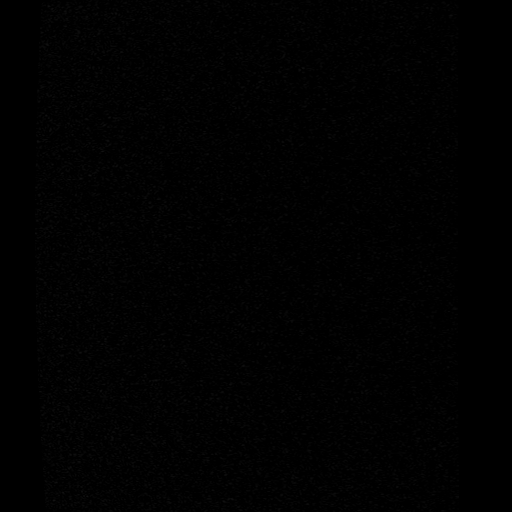
[im 6/36]
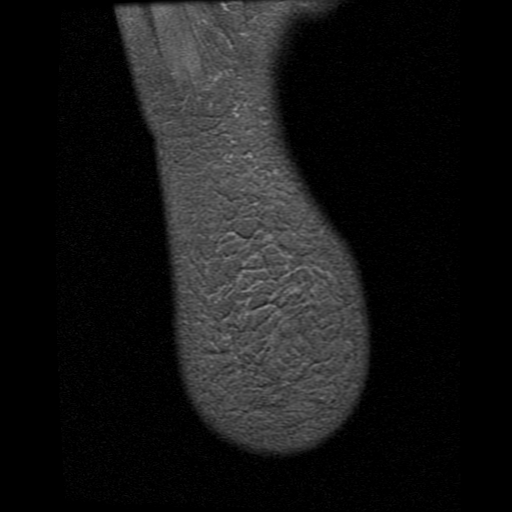
[im 12/36]
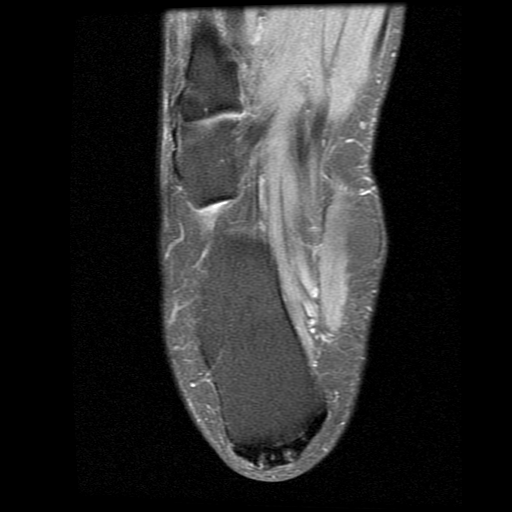
[im 18/36]
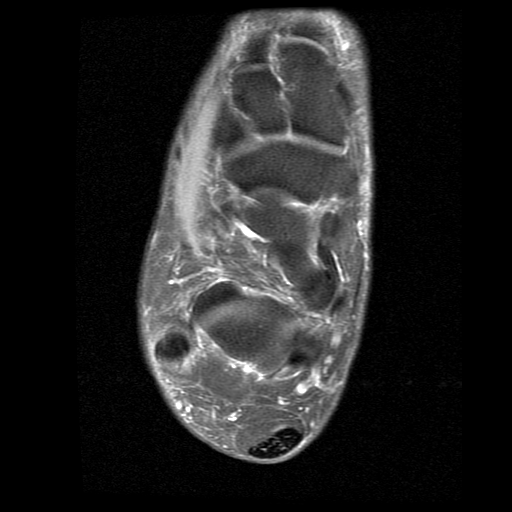
[im 24/36]
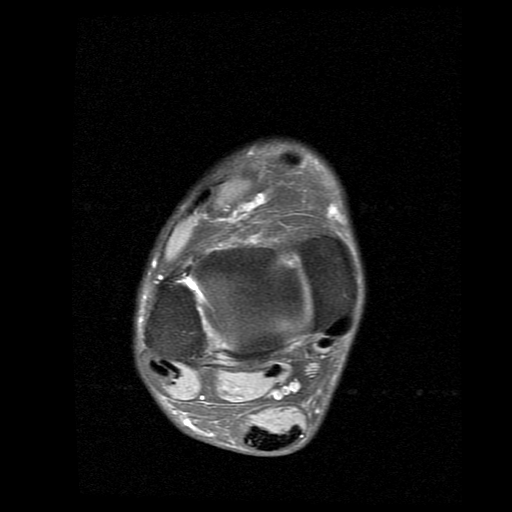
[im 30/36]
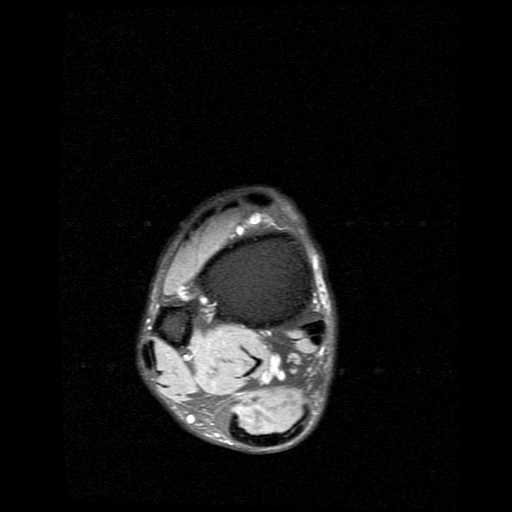
[im 36/36]
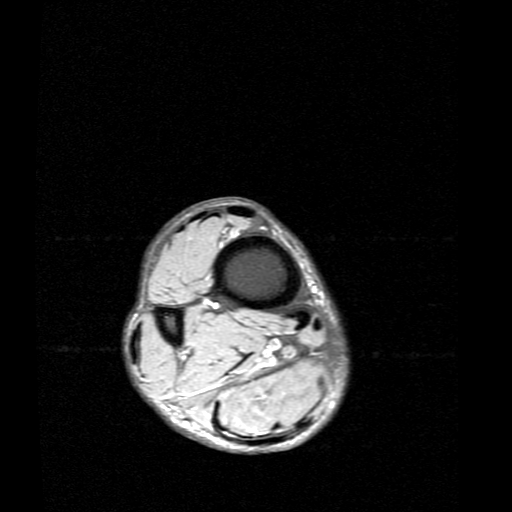

[Series 9: T2 fat-sat · sagittal · 3.0mm · 0.29mm/px · 3 of 23 slices shown (2 of 2)]
[im 1/23]
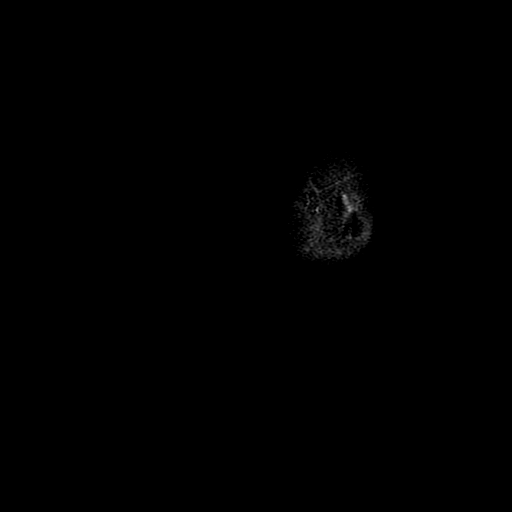
[im 15/23]
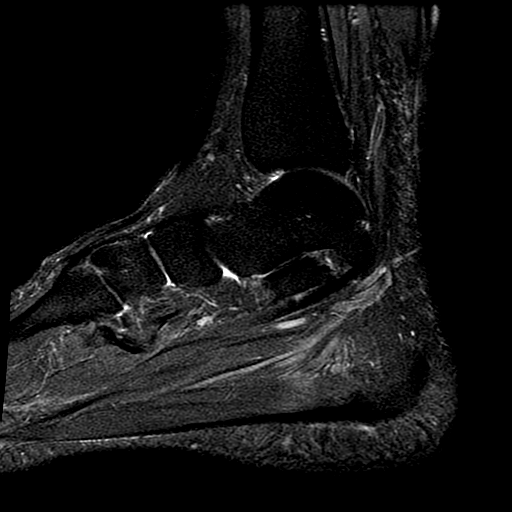
[im 23/23]
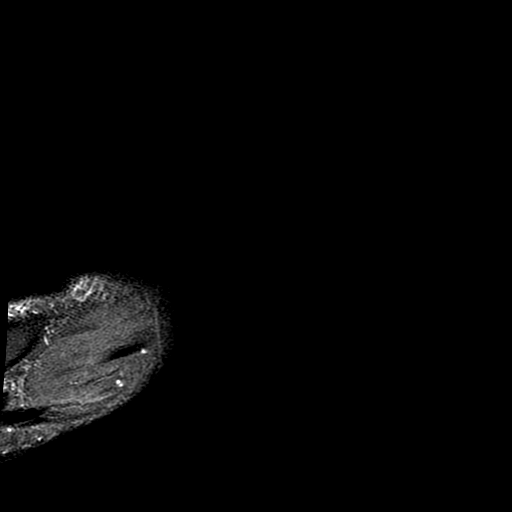

[19 of 40 positions shown; findings below may reference images not displayed]

FINDINGS: TENDONS

Peroneal: Intact and normally positioned.

Posteromedial: Intact and normally positioned.

Anterior: Intact and normally positioned.

Achilles: There is focal T2 hyperintensity distally within the
Achilles tendon. Based on the T1 weighted images, this appears to be
within posterior calcaneal spurs which are nondisplaced. Proximally,
the Achilles tendon appears normal. There is no surrounding soft
tissue abnormality or bursitis.

Plantar Fascia: Intact.

LIGAMENTS

Lateral: The anterior talofibular ligament appears thickened,
possibly secondary to prior injury. In the appropriate clinical
context, this could indicate anterolateral impingement. The
additional lateral ankle ligaments appear normal.

Medial: Intact.

CARTILAGE

Ankle Joint: Small ankle joint effusion. The talar dome and tibial
plafond appear normal. No loose bodies observed.

Subtalar Joints/Sinus Tarsi: Unremarkable.

Bones: No significant extra-articular osseous findings.
IMPRESSION: 1. T2 hyperintensity at the insertion of the Achilles tendon
consistent with focal tendinitis or symptomatic enthesophytes. No
tendon rupture, retraction or surrounding bursitis.
2. The additional ankle tendons appear normal.
3. The anterior talofibular ligament appears mildly thickened. This
may be secondary to prior injury or anterolateral impingement.
Correlate clinically.

## 2016-08-19 ENCOUNTER — Encounter: Payer: Medicare Other | Attending: Physical Medicine & Rehabilitation | Admitting: Registered Nurse

## 2016-08-19 ENCOUNTER — Encounter: Payer: Self-pay | Admitting: Registered Nurse

## 2016-08-19 VITALS — BP 142/90 | HR 79

## 2016-08-19 DIAGNOSIS — N920 Excessive and frequent menstruation with regular cycle: Secondary | ICD-10-CM | POA: Diagnosis not present

## 2016-08-19 DIAGNOSIS — F418 Other specified anxiety disorders: Secondary | ICD-10-CM | POA: Diagnosis not present

## 2016-08-19 DIAGNOSIS — Z87442 Personal history of urinary calculi: Secondary | ICD-10-CM | POA: Diagnosis not present

## 2016-08-19 DIAGNOSIS — E669 Obesity, unspecified: Secondary | ICD-10-CM | POA: Insufficient documentation

## 2016-08-19 DIAGNOSIS — G2581 Restless legs syndrome: Secondary | ICD-10-CM | POA: Diagnosis not present

## 2016-08-19 DIAGNOSIS — G894 Chronic pain syndrome: Secondary | ICD-10-CM | POA: Diagnosis not present

## 2016-08-19 DIAGNOSIS — Z760 Encounter for issue of repeat prescription: Secondary | ICD-10-CM | POA: Diagnosis not present

## 2016-08-19 DIAGNOSIS — G43909 Migraine, unspecified, not intractable, without status migrainosus: Secondary | ICD-10-CM | POA: Insufficient documentation

## 2016-08-19 DIAGNOSIS — K589 Irritable bowel syndrome without diarrhea: Secondary | ICD-10-CM | POA: Diagnosis not present

## 2016-08-19 DIAGNOSIS — M47816 Spondylosis without myelopathy or radiculopathy, lumbar region: Secondary | ICD-10-CM | POA: Diagnosis not present

## 2016-08-19 DIAGNOSIS — N393 Stress incontinence (female) (male): Secondary | ICD-10-CM | POA: Diagnosis not present

## 2016-08-19 DIAGNOSIS — N3010 Interstitial cystitis (chronic) without hematuria: Secondary | ICD-10-CM | POA: Diagnosis not present

## 2016-08-19 DIAGNOSIS — M5416 Radiculopathy, lumbar region: Secondary | ICD-10-CM

## 2016-08-19 DIAGNOSIS — M797 Fibromyalgia: Secondary | ICD-10-CM | POA: Diagnosis not present

## 2016-08-19 DIAGNOSIS — G4733 Obstructive sleep apnea (adult) (pediatric): Secondary | ICD-10-CM | POA: Insufficient documentation

## 2016-08-19 DIAGNOSIS — M79671 Pain in right foot: Secondary | ICD-10-CM | POA: Insufficient documentation

## 2016-08-19 DIAGNOSIS — M25551 Pain in right hip: Secondary | ICD-10-CM | POA: Diagnosis not present

## 2016-08-19 DIAGNOSIS — Z981 Arthrodesis status: Secondary | ICD-10-CM

## 2016-08-19 DIAGNOSIS — M545 Low back pain: Secondary | ICD-10-CM | POA: Diagnosis not present

## 2016-08-19 DIAGNOSIS — F419 Anxiety disorder, unspecified: Secondary | ICD-10-CM

## 2016-08-19 DIAGNOSIS — Z79899 Other long term (current) drug therapy: Secondary | ICD-10-CM

## 2016-08-19 DIAGNOSIS — M62838 Other muscle spasm: Secondary | ICD-10-CM

## 2016-08-19 DIAGNOSIS — Z5181 Encounter for therapeutic drug level monitoring: Secondary | ICD-10-CM

## 2016-08-19 DIAGNOSIS — M1990 Unspecified osteoarthritis, unspecified site: Secondary | ICD-10-CM | POA: Insufficient documentation

## 2016-08-19 DIAGNOSIS — K219 Gastro-esophageal reflux disease without esophagitis: Secondary | ICD-10-CM | POA: Diagnosis not present

## 2016-08-19 DIAGNOSIS — E785 Hyperlipidemia, unspecified: Secondary | ICD-10-CM | POA: Diagnosis not present

## 2016-08-19 DIAGNOSIS — F3289 Other specified depressive episodes: Secondary | ICD-10-CM

## 2016-08-19 DIAGNOSIS — G8929 Other chronic pain: Secondary | ICD-10-CM | POA: Diagnosis not present

## 2016-08-19 DIAGNOSIS — J45998 Other asthma: Secondary | ICD-10-CM | POA: Insufficient documentation

## 2016-08-19 MED ORDER — HYDROCODONE-ACETAMINOPHEN 10-325 MG PO TABS: ORAL_TABLET | ORAL | 0 refills | Status: DC

## 2016-08-19 NOTE — Progress Notes (Signed)
Subjective:    Patient ID: Karina Greer, female    DOB: 09-03-1966, 50 y.o.   MRN: [id number]  HPI: Karina Greer is a 50 year old female who returns for follow up for chronic pain and medication refill. She states her pain is located in her neck, lower back radiating into herright hip. She rates her pain 7.Her current exercise regime is walking and performing stretching exercises.  S/p C5C6- C6-C7 Anterior Cervical Discectomy and Fusion, Allograft Plate by Dr. Lorin Mercy.  Pain Inventory Average Pain 7 Pain Right Now 7 My pain is constant, sharp and stabbing  In the last 24 hours, has pain interfered with the following? General activity 6 Relation with others 7 Enjoyment of life 6 What TIME of day is your pain at its worst? all Sleep (in general) Fair  Pain is worse with: walking, bending, sitting, inactivity, standing and some activites Pain improves with: rest, heat/ice, therapy/exercise, pacing activities, medication and TENS Relief from Meds: 6  Mobility walk without assistance how many minutes can you walk? 15 ability to climb steps?  yes do you drive?  yes Do you have any goals in this area?  yes  Function disabled: date disabled 2005 I need assistance with the following:  meal prep, household duties and shopping Do you have any goals in this area?  no  Neuro/Psych bladder control problems dizziness depression anxiety  Prior Studies Any changes since last visit?  no  Physicians involved in your care Any changes since last visit?  no   Family History  Problem Relation Age of Onset  . Hypertension Mother   . Diabetes Father   . Fibromyalgia Sister   . Hypertension Other     Cancer, Cerebrovascular disease run on mother side of family   Social History   Social History  . Marital status: Divorced    Spouse name: N/A  . Number of children: 3  . Years of education: HS   Occupational History  . Unemployed Unemployed    Disability   Social  History Main Topics  . Smoking status: Never Smoker  . Smokeless tobacco: Never Used  . Alcohol use No  . Drug use: No  . Sexual activity: Not Asked   Other Topics Concern  . None   Social History Narrative  . None   Past Surgical History:  Procedure Laterality Date  . ACHILLES TENDON SURGERY Right 3/16  . ANTERIOR CERVICAL DECOMP/DISCECTOMY FUSION N/A 03/11/2016   Procedure: C5-6, C6-7 Anterior Cervical Discectomy and Fusion, Allograft, Plate;  Surgeon: Marybelle Killings, MD;  Location: [place];  Service: Orthopedics;  Laterality: N/A;  . CHOLECYSTECTOMY  2014  . CYSTO WITH HYDRODISTENSION N/A 01/26/2014   Procedure: CYSTOSCOPY/HYDRODISTENSION;  Surgeon: Bernestine Amass, MD;  Location: [facility];  Service: Urology;  Laterality: N/A;  . CYSTO/  URETHRAL DILATION/  HYDRODISTENTION/   INSTILLATION THERAPY  07-16-2010//   12-30-2007//   10-27-2006  . EXERCISE TOLERENCE TEST  10-12-2010   NEGATIVE  ADEQUATE ETT/  NO ISCHEMIA OR EVIDENCE HIGH GRADE OBSTRUCTIVE CAD/  NO FURTHER TEST NEEDED  . [place] ENDOMETRIAL ABLATION  2014  . LAPAROSCOPIC OVARIAN CYST BX  2005   AND  URETEROSCOPIC LASER LITHO  STONE EXTRACTION  . SHOULDER OPEN ROTATOR CUFF REPAIR Right 2004  . TONSILLECTOMY    . TRANSTHORACIC ECHOCARDIOGRAM  06-07-2006   normal study/  ef 60-65%  . TUBAL LIGATION Bilateral 1995   Past Medical History:  Diagnosis Date  . Anxiety   . Anxiety disorder   . Arthritis   . Chronic low back pain   . Depression   . Dyslipidemia   . Fibromyalgia   . GERD (gastroesophageal reflux disease)   . Headache    hx migraines  . History of kidney stones   . History of panic attacks   . History of renal calculi   . IBS (irritable bowel syndrome)   . Interstitial cystitis   . Lupus    tested positive for antibodies for lupus. dr to do further tests  . Menorrhagia   . OSA on CPAP    not used cpap for several weeks  . Pneumonia    hx  . PONV (postoperative  nausea and vomiting)   . RLS (restless legs syndrome)   . Seasonal asthma   . SI (sacroiliac) joint dysfunction   . SUI (stress urinary incontinence, female)   . UTI (lower urinary tract infection)    hx  . 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 02/23/2013   BP (!) 142/90 (BP Location: Right Arm, Patient Position: Sitting, Cuff Size: Normal)   Pulse 79   SpO2 97%   Opioid Risk Score:   Fall Risk Score:  `1  Depression screen PHQ 2/9  Depression screen [facility] 2/9 12/12/2015 06/12/2015 04/17/2015 12/07/2014  Decreased Interest 3 3 3 3   Down, Depressed, Hopeless 1 3 2 2   PHQ - 2 Score 4 6 5 5   Altered sleeping 2 - - 3  Tired, decreased energy 3 - - 3  Change in appetite 3 - - 3  Feeling bad or failure about yourself  3 - - 1  Trouble concentrating 3 - - 3  Moving slowly or fidgety/restless 0 - - 1  Suicidal thoughts 0 - - 0  PHQ-9 Score 18 - - 19  Difficult doing work/chores Very difficult - - -   Review of Systems  Constitutional: Negative.   HENT: Negative.   Eyes: Negative.   Respiratory: Negative.   Cardiovascular: Negative.   Gastrointestinal: Negative.   Endocrine: Negative.   Genitourinary: Positive for difficulty urinating.  Musculoskeletal: Positive for arthralgias and back pain.  Skin: Negative.   Allergic/Immunologic: Negative.   Neurological: Positive for dizziness.  Hematological: Negative.   Psychiatric/Behavioral: Positive for dysphoric mood. The patient is nervous/anxious.   All other systems reviewed and are negative.      Objective:   Physical Exam  Constitutional: She is oriented to person, place, and time. She appears well-developed and well-nourished.  HENT:  Head: Normocephalic.  Neck: Normal range of motion. Neck supple.  Cervical Paraspinal Tenderness: C-5-C-6  Cardiovascular: Normal rate and regular rhythm.   Pulmonary/Chest: Effort normal and breath sounds normal.  Musculoskeletal:  Normal Muscle Bulk and Muscle Testing Reveals: Upper  Extremities: Full ROM and Muscle Strength 5/5 Lumbar Paraspinal Tenderness: L-3-L-5 Right Greater Trochanteric Tenderness Lower Extremities: Full ROM and Muscle Strength 5/5   Neurological: She is alert and oriented to person, place, and time.  Skin: Skin is warm and dry.  Psychiatric: She has a normal mood and affect.  Nursing note and vitals reviewed.         Assessment & Plan:  1. Lumbago:  Refilled: HYDROcodone 10/325mg  one tablet every 6 hours as needed #120. Second script given for the following month. We will continue the opioid monitoring program, this consists of regular clinic visits, examinations, urine drug screen, pill counts as well as use of [place] Controlled  Substance Reporting System. 2. Fibromyalgia. Continue Current exercise Regime  3. Anxiety and depression:Psychiatrist Following at the [place] [facility]. On Xanax and Prestiq 4. Migraines: On Maxalt. Neurology Following  5. OSA : Continue exercise regime as tolerated and losing weight.  6. Obesity: Following Healthy Diet Regimen 7. Status Post Cervical Spinal Fusion: C5C6- C-6- C-7 Anterior Cervical Discectomy Fusion and Allograft Plate.: Dr. Lorin Mercy Following 8. Muscle Spasm: Continue Tizanidine  20 minutes of face to face patient care time was spent during this visit. All questions were encouraged and answered.   F/U in 2 months

## 2016-08-20 ENCOUNTER — Telehealth: Payer: Self-pay | Admitting: Registered Nurse

## 2016-08-20 DIAGNOSIS — M47816 Spondylosis without myelopathy or radiculopathy, lumbar region: Secondary | ICD-10-CM

## 2016-08-20 DIAGNOSIS — F419 Anxiety disorder, unspecified: Secondary | ICD-10-CM

## 2016-08-20 MED ORDER — HYDROCODONE-ACETAMINOPHEN 10-325 MG PO TABS: ORAL_TABLET | ORAL | 0 refills | Status: DC

## 2016-08-20 NOTE — Telephone Encounter (Signed)
New prescription for Hydrocodone printed, Karina Greer will return her Hydrocodone prescription that was printed for January 2018. Due to wrong date was placed on her prescription. She is aware of the above. Will be picking up the Hydrocodone prescription this week.

## 2016-08-21 DIAGNOSIS — G4733 Obstructive sleep apnea (adult) (pediatric): Secondary | ICD-10-CM | POA: Diagnosis not present

## 2016-08-21 DIAGNOSIS — J069 Acute upper respiratory infection, unspecified: Secondary | ICD-10-CM | POA: Diagnosis not present

## 2016-08-21 DIAGNOSIS — R0602 Shortness of breath: Secondary | ICD-10-CM | POA: Diagnosis not present

## 2016-08-21 DIAGNOSIS — J301 Allergic rhinitis due to pollen: Secondary | ICD-10-CM | POA: Diagnosis not present

## 2016-09-04 DIAGNOSIS — G4733 Obstructive sleep apnea (adult) (pediatric): Secondary | ICD-10-CM | POA: Diagnosis not present

## 2016-09-04 DIAGNOSIS — J301 Allergic rhinitis due to pollen: Secondary | ICD-10-CM | POA: Diagnosis not present

## 2016-09-12 ENCOUNTER — Telehealth: Payer: Self-pay | Admitting: *Deleted

## 2016-09-12 NOTE — Telephone Encounter (Signed)
Karina Greer has a cold and is going to her pcp and is asking ahead of time if she can have tussinex if prescribed. I have advised her that she is to avoid taking at the same time with her pain medication because of hydrocodone is the main ingredient.

## 2016-09-13 DIAGNOSIS — R42 Dizziness and giddiness: Secondary | ICD-10-CM | POA: Diagnosis not present

## 2016-09-13 DIAGNOSIS — J069 Acute upper respiratory infection, unspecified: Secondary | ICD-10-CM | POA: Diagnosis not present

## 2016-09-20 DIAGNOSIS — Z6841 Body Mass Index (BMI) 40.0 and over, adult: Secondary | ICD-10-CM | POA: Diagnosis not present

## 2016-09-20 DIAGNOSIS — R05 Cough: Secondary | ICD-10-CM | POA: Diagnosis not present

## 2016-09-20 NOTE — Telephone Encounter (Signed)
Prescription was never picked up. Destroyed.

## 2016-09-30 ENCOUNTER — Telehealth: Payer: Self-pay | Admitting: Registered Nurse

## 2016-09-30 DIAGNOSIS — F419 Anxiety disorder, unspecified: Secondary | ICD-10-CM

## 2016-09-30 DIAGNOSIS — M47816 Spondylosis without myelopathy or radiculopathy, lumbar region: Secondary | ICD-10-CM

## 2016-09-30 MED ORDER — HYDROCODONE-ACETAMINOPHEN 10-325 MG PO TABS: ORAL_TABLET | ORAL | 0 refills | Status: DC

## 2016-09-30 NOTE — Telephone Encounter (Signed)
Karina Greer arrived to exchange her Hydrocodone prescription, the post dated date was 10/03/15. New prescription re-printed.

## 2016-10-07 DIAGNOSIS — J209 Acute bronchitis, unspecified: Secondary | ICD-10-CM | POA: Diagnosis not present

## 2016-10-07 DIAGNOSIS — J019 Acute sinusitis, unspecified: Secondary | ICD-10-CM | POA: Diagnosis not present

## 2016-10-07 DIAGNOSIS — Z6841 Body Mass Index (BMI) 40.0 and over, adult: Secondary | ICD-10-CM | POA: Diagnosis not present

## 2016-10-08 DIAGNOSIS — J301 Allergic rhinitis due to pollen: Secondary | ICD-10-CM | POA: Diagnosis not present

## 2016-10-14 ENCOUNTER — Encounter: Payer: Self-pay | Admitting: Physical Medicine & Rehabilitation

## 2016-10-14 ENCOUNTER — Encounter: Payer: Medicare Other | Attending: Physical Medicine & Rehabilitation | Admitting: Physical Medicine & Rehabilitation

## 2016-10-14 VITALS — BP 155/95 | HR 78 | Resp 14

## 2016-10-14 DIAGNOSIS — K219 Gastro-esophageal reflux disease without esophagitis: Secondary | ICD-10-CM | POA: Diagnosis not present

## 2016-10-14 DIAGNOSIS — G2581 Restless legs syndrome: Secondary | ICD-10-CM | POA: Insufficient documentation

## 2016-10-14 DIAGNOSIS — M545 Low back pain: Secondary | ICD-10-CM | POA: Insufficient documentation

## 2016-10-14 DIAGNOSIS — M1990 Unspecified osteoarthritis, unspecified site: Secondary | ICD-10-CM | POA: Insufficient documentation

## 2016-10-14 DIAGNOSIS — M797 Fibromyalgia: Secondary | ICD-10-CM | POA: Insufficient documentation

## 2016-10-14 DIAGNOSIS — E669 Obesity, unspecified: Secondary | ICD-10-CM | POA: Insufficient documentation

## 2016-10-14 DIAGNOSIS — K589 Irritable bowel syndrome without diarrhea: Secondary | ICD-10-CM | POA: Diagnosis not present

## 2016-10-14 DIAGNOSIS — F418 Other specified anxiety disorders: Secondary | ICD-10-CM | POA: Insufficient documentation

## 2016-10-14 DIAGNOSIS — M47816 Spondylosis without myelopathy or radiculopathy, lumbar region: Secondary | ICD-10-CM | POA: Diagnosis not present

## 2016-10-14 DIAGNOSIS — M25551 Pain in right hip: Secondary | ICD-10-CM | POA: Insufficient documentation

## 2016-10-14 DIAGNOSIS — Z981 Arthrodesis status: Secondary | ICD-10-CM | POA: Diagnosis not present

## 2016-10-14 DIAGNOSIS — G8929 Other chronic pain: Secondary | ICD-10-CM | POA: Diagnosis not present

## 2016-10-14 DIAGNOSIS — E785 Hyperlipidemia, unspecified: Secondary | ICD-10-CM | POA: Diagnosis not present

## 2016-10-14 DIAGNOSIS — Z79899 Other long term (current) drug therapy: Secondary | ICD-10-CM | POA: Diagnosis not present

## 2016-10-14 DIAGNOSIS — G4733 Obstructive sleep apnea (adult) (pediatric): Secondary | ICD-10-CM | POA: Diagnosis not present

## 2016-10-14 DIAGNOSIS — Z5181 Encounter for therapeutic drug level monitoring: Secondary | ICD-10-CM | POA: Diagnosis not present

## 2016-10-14 DIAGNOSIS — Z87442 Personal history of urinary calculi: Secondary | ICD-10-CM | POA: Insufficient documentation

## 2016-10-14 DIAGNOSIS — Z760 Encounter for issue of repeat prescription: Secondary | ICD-10-CM | POA: Diagnosis not present

## 2016-10-14 DIAGNOSIS — N3010 Interstitial cystitis (chronic) without hematuria: Secondary | ICD-10-CM | POA: Insufficient documentation

## 2016-10-14 DIAGNOSIS — N920 Excessive and frequent menstruation with regular cycle: Secondary | ICD-10-CM | POA: Insufficient documentation

## 2016-10-14 DIAGNOSIS — G43001 Migraine without aura, not intractable, with status migrainosus: Secondary | ICD-10-CM | POA: Diagnosis not present

## 2016-10-14 DIAGNOSIS — J45998 Other asthma: Secondary | ICD-10-CM | POA: Insufficient documentation

## 2016-10-14 DIAGNOSIS — N393 Stress incontinence (female) (male): Secondary | ICD-10-CM | POA: Diagnosis not present

## 2016-10-14 DIAGNOSIS — M79671 Pain in right foot: Secondary | ICD-10-CM | POA: Diagnosis not present

## 2016-10-14 DIAGNOSIS — F419 Anxiety disorder, unspecified: Secondary | ICD-10-CM

## 2016-10-14 DIAGNOSIS — G43909 Migraine, unspecified, not intractable, without status migrainosus: Secondary | ICD-10-CM | POA: Insufficient documentation

## 2016-10-14 MED ORDER — HYDROCODONE-ACETAMINOPHEN 10-325 MG PO TABS: ORAL_TABLET | ORAL | 0 refills | Status: DC

## 2016-10-14 MED FILL — HYDROCODON-APAP 10-325: 10-325 | 30 days supply | Qty: 120 | Fill #0

## 2016-10-14 NOTE — Patient Instructions (Signed)
PLEASE FEEL FREE TO CALL OUR OFFICE WITH ANY PROBLEMS OR QUESTIONS ([phone])      

## 2016-10-14 NOTE — Addendum Note (Signed)
Addended by: Caro Hight on: 10/14/2016 11:22 AM   Modules accepted: Orders

## 2016-10-14 NOTE — Addendum Note (Signed)
Addended by: [name] D on: 10/14/2016 03:33 PM   Modules accepted: Orders

## 2016-10-14 NOTE — Progress Notes (Signed)
Subjective:    Patient ID: Karina Greer, female    DOB: 06/23/1966, 51 y.o.   MRN: [id number]  HPI  Karina Greer is here in follow up of her chronic pain. She has had problems recently with a new brand of generic hydrocodone which has caused nausea. She is looking for another pharmacy which has her desired generic. Apparently she had an issue with one of the pharmacists who was rude to her. She has remained on hydrocodone 10/325 for her baseline pain control.   She is now 7 months out from her cervical spine surgery. She is supposed to be following up with Dr. Lorin Mercy regarding further options for her low back.  She has been working more on her exercise. She is walking just about every other day. She can go to about a mile before she begins to have back and right leg pain.     Pain Inventory Average Pain 9 Pain Right Now 9 My pain is constant, tingling and aching  In the last 24 hours, has pain interfered with the following? General activity 10 Relation with others 10 Enjoyment of life 8 What TIME of day is your pain at its worst? all Sleep (in general) Poor  Pain is worse with: walking, bending, sitting, standing and some activites Pain improves with: medication Relief from Meds: 7  Mobility walk without assistance  Function disabled: date disabled .  Neuro/Psych bladder control problems trouble walking dizziness depression anxiety  Prior Studies Any changes since last visit?  no  Physicians involved in your care Any changes since last visit?  no   Family History  Problem Relation Age of Onset  . Hypertension Mother   . Diabetes Father   . Fibromyalgia Sister   . Hypertension Other     Cancer, Cerebrovascular disease run on mother side of family   Social History   Social History  . Marital status: Divorced    Spouse name: N/A  . Number of children: 3  . Years of education: HS   Occupational History  . Unemployed Unemployed    Disability   Social  History Main Topics  . Smoking status: Never Smoker  . Smokeless tobacco: Never Used  . Alcohol use No  . Drug use: No  . Sexual activity: Not Asked   Other Topics Concern  . None   Social History Narrative  . None   Past Surgical History:  Procedure Laterality Date  . ACHILLES TENDON SURGERY Right 3/16  . ANTERIOR CERVICAL DECOMP/DISCECTOMY FUSION N/A 03/11/2016   Procedure: C5-6, C6-7 Anterior Cervical Discectomy and Fusion, Allograft, Plate;  Surgeon: Marybelle Killings, MD;  Location: [facility];  Service: Orthopedics;  Laterality: N/A;  . CHOLECYSTECTOMY  2014  . CYSTO WITH HYDRODISTENSION N/A 01/26/2014   Procedure: CYSTOSCOPY/HYDRODISTENSION;  Surgeon: Bernestine Amass, MD;  Location: [facility];  Service: Urology;  Laterality: N/A;  . CYSTO/  URETHRAL DILATION/  HYDRODISTENTION/   INSTILLATION THERAPY  07-16-2010//   12-30-2007//   10-27-2006  . EXERCISE TOLERENCE TEST  10-12-2010   NEGATIVE  ADEQUATE ETT/  NO ISCHEMIA OR EVIDENCE HIGH GRADE OBSTRUCTIVE CAD/  NO FURTHER TEST NEEDED  . [place] ENDOMETRIAL ABLATION  2014  . LAPAROSCOPIC OVARIAN CYST BX  2005   AND  URETEROSCOPIC LASER LITHO  STONE EXTRACTION  . SHOULDER OPEN ROTATOR CUFF REPAIR Right 2004  . TONSILLECTOMY    . TRANSTHORACIC ECHOCARDIOGRAM  06-07-2006   normal study/  ef 60-65%  .  TUBAL LIGATION Bilateral 1995   Past Medical History:  Diagnosis Date  . Anxiety   . Anxiety disorder   . Arthritis   . Chronic low back pain   . Depression   . Dyslipidemia   . Fibromyalgia   . GERD (gastroesophageal reflux disease)   . Headache    hx migraines  . History of kidney stones   . History of panic attacks   . History of renal calculi   . IBS (irritable bowel syndrome)   . Interstitial cystitis   . Lupus    tested positive for antibodies for lupus. dr to do further tests  . Menorrhagia   . OSA on CPAP    not used cpap for several weeks  . Pneumonia    hx  . PONV (postoperative  nausea and vomiting)   . RLS (restless legs syndrome)   . Seasonal asthma   . SI (sacroiliac) joint dysfunction   . SUI (stress urinary incontinence, female)   . UTI (lower urinary tract infection)    hx  . 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 02/23/2013   BP (!) 155/95   Pulse 78   Resp 14   SpO2 98%   Opioid Risk Score:   Fall Risk Score:  `1  Depression screen PHQ 2/9  Depression screen [facility] 2/9 12/12/2015 06/12/2015 04/17/2015 12/07/2014  Decreased Interest 3 3 3 3   Down, Depressed, Hopeless 1 3 2 2   PHQ - 2 Score 4 6 5 5   Altered sleeping 2 - - 3  Tired, decreased energy 3 - - 3  Change in appetite 3 - - 3  Feeling bad or failure about yourself  3 - - 1  Trouble concentrating 3 - - 3  Moving slowly or fidgety/restless 0 - - 1  Suicidal thoughts 0 - - 0  PHQ-9 Score 18 - - 19  Difficult doing work/chores Very difficult - - -  '  Review of Systems  Constitutional: Negative.   HENT: Negative.   Eyes: Negative.   Respiratory: Negative.   Cardiovascular: Negative.   Gastrointestinal: Negative.   Endocrine: Negative.   Genitourinary: Negative.   Musculoskeletal: Positive for arthralgias, back pain and gait problem.  Allergic/Immunologic: Negative.   Neurological: Positive for dizziness.  Hematological: Negative.   Psychiatric/Behavioral: Positive for dysphoric mood. The patient is nervous/anxious.   All other systems reviewed and are negative.      Objective:   Physical Exam  Constitutional: She is oriented to person, place, and time. She appears well-developed and well-nourished.  HENT:  Head: Normocephalic.  Neck: Normal range of motion. Neck supple.  Cervical Paraspinal Tenderness: C-5-C-6  Cardiovascular: RRR.   Pulmonary/Chest: CTA B.  Musculoskeletal:  Normal Muscle Bulk and Muscle Testing Reveals: Upper Extremities: Full ROM and Muscle Strength 5/5 Lumbar Paraspinal Tenderness: L-3-L-5 Right Greater Trochanteric Tenderness Lower Extremities: Full  ROM and Muscle Strength 5/5   Neurological: She is alert and oriented to person, place, and time.  Skin: Skin is warm and dry.  Psychiatric: She has a normal mood and affect.  Nursing note and vitals reviewed.        Assessment & Plan:  1.Lumbar spondylosis/facet arthropathy:  Refilled: HYDROcodone 10/325mg  one tablet every 6 hours as needed #120. Second script given for the following month. We will continue the opioid monitoring program, this consists of regular clinic visits, examinations, urine drug screen, pill counts as well as use of [place] Controlled Substance Reporting System.  -ortho following for  surgical options.  -will make a referral for PT to address ROM/modalities----HEP (she will go to facility in [facility]) 2. Fibromyalgia. Continue Current exercise Regime/HEP  -urged her to increase exercise to daily and to perform stretches prior to her walking 3. Anxiety and depression:Psychiatrist Following at the [facility]. On Xanax and Prestiq 4. Migraines: On Maxalt. Neurology Following  5. OSA : Continue exercise regime as tolerated and losing weight.  6. Obesity: Following Healthy Diet Regimen 7. Status Post Cervical Spinal Fusion: C5C6- C-6- C-7 Anterior Cervical Discectomy Fusion and Allograft Plate.: Dr. Lorin Mercy Following  -7 months post-op 8. Muscle Spasm: Continue Tizanidine  20 minutes of face to face patient care time was spent during this visit. All questions were encouraged and answered.   F/U in 1 months with me or NP

## 2016-10-20 LAB — TOXASSURE SELECT,+ANTIDEPR,UR

## 2016-10-21 NOTE — Progress Notes (Signed)
Urine drug screen for this encounter is consistent for prescribed medication 

## 2016-10-28 DIAGNOSIS — J029 Acute pharyngitis, unspecified: Secondary | ICD-10-CM | POA: Diagnosis not present

## 2016-10-28 DIAGNOSIS — E559 Vitamin D deficiency, unspecified: Secondary | ICD-10-CM | POA: Diagnosis not present

## 2016-10-28 DIAGNOSIS — R5383 Other fatigue: Secondary | ICD-10-CM | POA: Diagnosis not present

## 2016-10-28 DIAGNOSIS — Z6839 Body mass index (BMI) 39.0-39.9, adult: Secondary | ICD-10-CM | POA: Diagnosis not present

## 2016-11-08 ENCOUNTER — Encounter: Payer: Medicare Other | Attending: Physical Medicine & Rehabilitation | Admitting: Registered Nurse

## 2016-11-08 ENCOUNTER — Telehealth: Payer: Self-pay | Admitting: Registered Nurse

## 2016-11-08 ENCOUNTER — Ambulatory Visit
Admission: RE | Admit: 2016-11-08 | Discharge: 2016-11-08 | Disposition: A | Discharge status: Home / Self Care | Payer: Medicare Other | Source: Ambulatory Visit | Attending: Registered Nurse | Admitting: Registered Nurse

## 2016-11-08 ENCOUNTER — Encounter: Payer: Self-pay | Admitting: Registered Nurse

## 2016-11-08 VITALS — BP 132/90 | HR 78

## 2016-11-08 DIAGNOSIS — M545 Low back pain: Secondary | ICD-10-CM | POA: Insufficient documentation

## 2016-11-08 DIAGNOSIS — N920 Excessive and frequent menstruation with regular cycle: Secondary | ICD-10-CM | POA: Insufficient documentation

## 2016-11-08 DIAGNOSIS — F419 Anxiety disorder, unspecified: Secondary | ICD-10-CM | POA: Diagnosis not present

## 2016-11-08 DIAGNOSIS — Z79899 Other long term (current) drug therapy: Secondary | ICD-10-CM

## 2016-11-08 DIAGNOSIS — G894 Chronic pain syndrome: Secondary | ICD-10-CM

## 2016-11-08 DIAGNOSIS — E669 Obesity, unspecified: Secondary | ICD-10-CM | POA: Insufficient documentation

## 2016-11-08 DIAGNOSIS — Z981 Arthrodesis status: Secondary | ICD-10-CM

## 2016-11-08 DIAGNOSIS — G8929 Other chronic pain: Secondary | ICD-10-CM | POA: Diagnosis not present

## 2016-11-08 DIAGNOSIS — Z760 Encounter for issue of repeat prescription: Secondary | ICD-10-CM | POA: Insufficient documentation

## 2016-11-08 DIAGNOSIS — N393 Stress incontinence (female) (male): Secondary | ICD-10-CM | POA: Diagnosis not present

## 2016-11-08 DIAGNOSIS — E785 Hyperlipidemia, unspecified: Secondary | ICD-10-CM | POA: Diagnosis not present

## 2016-11-08 DIAGNOSIS — M79671 Pain in right foot: Secondary | ICD-10-CM | POA: Insufficient documentation

## 2016-11-08 DIAGNOSIS — M797 Fibromyalgia: Secondary | ICD-10-CM | POA: Insufficient documentation

## 2016-11-08 DIAGNOSIS — Z87442 Personal history of urinary calculi: Secondary | ICD-10-CM | POA: Insufficient documentation

## 2016-11-08 DIAGNOSIS — M5416 Radiculopathy, lumbar region: Secondary | ICD-10-CM | POA: Diagnosis not present

## 2016-11-08 DIAGNOSIS — J45998 Other asthma: Secondary | ICD-10-CM | POA: Diagnosis not present

## 2016-11-08 DIAGNOSIS — M25551 Pain in right hip: Secondary | ICD-10-CM | POA: Diagnosis not present

## 2016-11-08 DIAGNOSIS — G4733 Obstructive sleep apnea (adult) (pediatric): Secondary | ICD-10-CM | POA: Diagnosis not present

## 2016-11-08 DIAGNOSIS — G43909 Migraine, unspecified, not intractable, without status migrainosus: Secondary | ICD-10-CM | POA: Diagnosis not present

## 2016-11-08 DIAGNOSIS — M25552 Pain in left hip: Secondary | ICD-10-CM | POA: Diagnosis not present

## 2016-11-08 DIAGNOSIS — M1990 Unspecified osteoarthritis, unspecified site: Secondary | ICD-10-CM | POA: Insufficient documentation

## 2016-11-08 DIAGNOSIS — M47816 Spondylosis without myelopathy or radiculopathy, lumbar region: Secondary | ICD-10-CM

## 2016-11-08 DIAGNOSIS — K219 Gastro-esophageal reflux disease without esophagitis: Secondary | ICD-10-CM | POA: Insufficient documentation

## 2016-11-08 DIAGNOSIS — G2581 Restless legs syndrome: Secondary | ICD-10-CM | POA: Insufficient documentation

## 2016-11-08 DIAGNOSIS — K589 Irritable bowel syndrome without diarrhea: Secondary | ICD-10-CM | POA: Insufficient documentation

## 2016-11-08 DIAGNOSIS — N3010 Interstitial cystitis (chronic) without hematuria: Secondary | ICD-10-CM | POA: Diagnosis not present

## 2016-11-08 DIAGNOSIS — F418 Other specified anxiety disorders: Secondary | ICD-10-CM | POA: Insufficient documentation

## 2016-11-08 DIAGNOSIS — M1612 Unilateral primary osteoarthritis, left hip: Secondary | ICD-10-CM | POA: Diagnosis not present

## 2016-11-08 DIAGNOSIS — Z5181 Encounter for therapeutic drug level monitoring: Secondary | ICD-10-CM

## 2016-11-08 MED ORDER — HYDROCODONE-ACETAMINOPHEN 10-325 MG PO TABS: ORAL_TABLET | ORAL | 0 refills | Status: DC

## 2016-11-08 NOTE — Telephone Encounter (Signed)
On 11/08/2016 the [place] was reviewed no conflict was seen on the [place] with multiple prescribers. Ms. Lycan has a signed narcotic contract with our office. If there were any discrepancies this would have been reported to her physician.

## 2016-11-08 NOTE — Telephone Encounter (Signed)
Placed a call to [name], reviewed her Left hip X-ray, she verbalizes understanding.

## 2016-11-08 NOTE — Progress Notes (Signed)
Subjective:    Patient ID: Karina Greer, female    DOB: 05/05/1966, 51 y.o.   MRN: [id number]  HPI:  Karina Greer is a 51 year old female who returns for follow up appointment for chronic pain and medication refill. She states her pain is located in her lower back radiating into herleft hip and left lower extremity laterally, and right hip L>R.  Will order left hip X-ray, she verbalizes understanding. She rates her pain 7.Her current exercise regime is walking on Treadmill for 20 minutes 4-5 days a week, walking and performing stretching exercises.  S/p C5C6- C6-C7 Anterior Cervical Discectomy and Fusion, Allograft Plate by Dr. Lorin Mercy.  Pain Inventory Average Pain 9 Pain Right Now 8 My pain is constant, dull, stabbing and aching  In the last 24 hours, has pain interfered with the following? General activity 8 Relation with others 8 Enjoyment of life 8 What TIME of day is your pain at its worst? all Sleep (in general) Fair  Pain is worse with: walking, bending, sitting, inactivity, standing, unsure and some activites Pain improves with: rest, heat/ice, therapy/exercise, pacing activities, medication and TENS Relief from Meds: 5  Mobility walk without assistance ability to climb steps?  yes do you drive?  yes  Function I need assistance with the following:  meal prep, household duties and shopping  Neuro/Psych depression anxiety  Prior Studies Any changes since last visit?  no  Physicians involved in your care Any changes since last visit?  no   Family History  Problem Relation Age of Onset  . Hypertension Mother   . Diabetes Father   . Fibromyalgia Sister   . Hypertension Other     Cancer, Cerebrovascular disease run on mother side of family   Social History   Social History  . Marital status: Divorced    Spouse name: N/A  . Number of children: 3  . Years of education: HS   Occupational History  . Unemployed Unemployed    Disability   Social  History Main Topics  . Smoking status: Never Smoker  . Smokeless tobacco: Never Used  . Alcohol use No  . Drug use: No  . Sexual activity: Not Asked   Other Topics Concern  . None   Social History Narrative  . None   Past Surgical History:  Procedure Laterality Date  . ACHILLES TENDON SURGERY Right 3/16  . ANTERIOR CERVICAL DECOMP/DISCECTOMY FUSION N/A 03/11/2016   Procedure: C5-6, C6-7 Anterior Cervical Discectomy and Fusion, Allograft, Plate;  Surgeon: Karina Killings, MD;  Location: [facility];  Service: Orthopedics;  Laterality: N/A;  . CHOLECYSTECTOMY  2014  . CYSTO WITH HYDRODISTENSION N/A 01/26/2014   Procedure: CYSTOSCOPY/HYDRODISTENSION;  Surgeon: Bernestine Amass, MD;  Location: [facility];  Service: Urology;  Laterality: N/A;  . CYSTO/  URETHRAL DILATION/  HYDRODISTENTION/   INSTILLATION THERAPY  07-16-2010//   12-30-2007//   10-27-2006  . EXERCISE TOLERENCE TEST  10-12-2010   NEGATIVE  ADEQUATE ETT/  NO ISCHEMIA OR EVIDENCE HIGH GRADE OBSTRUCTIVE CAD/  NO FURTHER TEST NEEDED  . [place] ENDOMETRIAL ABLATION  2014  . LAPAROSCOPIC OVARIAN CYST BX  2005   AND  URETEROSCOPIC LASER LITHO  STONE EXTRACTION  . SHOULDER OPEN ROTATOR CUFF REPAIR Right 2004  . TONSILLECTOMY    . TRANSTHORACIC ECHOCARDIOGRAM  06-07-2006   normal study/  ef 60-65%  . TUBAL LIGATION Bilateral 1995   Past Medical History:  Diagnosis Date  .  Anxiety   . Anxiety disorder   . Arthritis   . Chronic low back pain   . Depression   . Dyslipidemia   . Fibromyalgia   . GERD (gastroesophageal reflux disease)   . Headache    hx migraines  . History of kidney stones   . History of panic attacks   . History of renal calculi   . IBS (irritable bowel syndrome)   . Interstitial cystitis   . Lupus    tested positive for antibodies for lupus. dr to do further tests  . Menorrhagia   . OSA on CPAP    not used cpap for several weeks  . Pneumonia    hx  . PONV (postoperative  nausea and vomiting)   . RLS (restless legs syndrome)   . Seasonal asthma   . SI (sacroiliac) joint dysfunction   . SUI (stress urinary incontinence, female)   . UTI (lower urinary tract infection)    hx  . 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 02/23/2013   BP 132/90   Pulse 78   SpO2 97%   Opioid Risk Score:   Fall Risk Score:  `1  Depression screen PHQ 2/9  Depression screen [facility] 2/9 12/12/2015 06/12/2015 04/17/2015 12/07/2014  Decreased Interest 3 3 3 3   Down, Depressed, Hopeless 1 3 2 2   PHQ - 2 Score 4 6 5 5   Altered sleeping 2 - - 3  Tired, decreased energy 3 - - 3  Change in appetite 3 - - 3  Feeling bad or failure about yourself  3 - - 1  Trouble concentrating 3 - - 3  Moving slowly or fidgety/restless 0 - - 1  Suicidal thoughts 0 - - 0  PHQ-9 Score 18 - - 19  Difficult doing work/chores Very difficult - - -    Review of Systems  Constitutional: Positive for appetite change.  HENT: Negative.   Eyes: Negative.   Respiratory: Positive for cough.   Cardiovascular: Negative.   Gastrointestinal: Positive for abdominal pain and nausea.  Endocrine: Negative.   Genitourinary: Negative.   Musculoskeletal: Positive for back pain.  Skin: Negative.   Allergic/Immunologic: Negative.   Neurological: Negative.   Hematological: Negative.   Psychiatric/Behavioral: Negative.   All other systems reviewed and are negative.      Objective:   Physical Exam  Constitutional: She is oriented to person, place, and time. She appears well-developed and well-nourished.  HENT:  Head: Normocephalic and atraumatic.  Neck: Normal range of motion. Neck supple.  Cardiovascular: Normal rate and regular rhythm.   Pulmonary/Chest: Effort normal and breath sounds normal.  Musculoskeletal:  Normal Muscle Bulk and Muscle testing Reveals: Upper Extremities: Full ROM and Muscle Strength 5/5 Lumbar Flexion 45 Degrees and Extension 30 Degrees Lumbar Paraspinal Tenderness: L-3-L-5 Bilateral  Greater Trochanteric Tenderness: L>R Negative Straight Leg Raise Lower Extremities: Full ROM and Muscle Strength 5/5 Right Lower Extremity Flexion Produces Pain into Right Hip and Lumbar Left Lower Extremity Flexion  And Extension Produces Pain into Left Hip Lower Extremity Abduction and Adduction Produces Pain into Left Hip Arises from Table Slowly   Neurological: She is alert and oriented to person, place, and time.  Skin: Skin is warm and dry.  Psychiatric: She has a normal mood and affect.  Nursing note and vitals reviewed.         Assessment & Plan:  1. Lumbago: 11/08/16 Refilled: HYDROcodone 10/325mg  one tablet every 6 hours as needed #120.  We will continue the  opioid monitoring program, this consists of regular clinic visits, examinations, urine drug screen, pill counts as well as use of [place] Controlled Substance Reporting System. 2. Fibromyalgia. Continue Current exercise Regime. 11/08/2016 3. Anxiety and depression:Psychiatrist Following at the [facility] [place]. On Xanax and Prestiq. 11/08/2016 4. Migraines: On Maxalt. Neurology Following. 11/08/2016 5. OSA : Continue exercise regime as tolerated and losing weight. 11/08/2016 6. Obesity: Following Healthy Diet Regimen. 11/08/2016 7. Status Post Cervical Spinal Fusion: C5C6- C-6- C-7 Anterior Cervical Discectomy Fusion and Allograft Plate.: Dr. Lorin Mercy Following. 11/08/2016 8. Muscle Spasm: Continue Tizanidine.11/08/2016  20 minutes of face to face patient care time was spent during this visit. All questions were encouraged and answered.   F/U in 1 month

## 2016-11-12 DIAGNOSIS — F411 Generalized anxiety disorder: Secondary | ICD-10-CM | POA: Diagnosis not present

## 2016-11-12 DIAGNOSIS — F331 Major depressive disorder, recurrent, moderate: Secondary | ICD-10-CM | POA: Diagnosis not present

## 2016-11-12 DIAGNOSIS — J301 Allergic rhinitis due to pollen: Secondary | ICD-10-CM | POA: Diagnosis not present

## 2016-11-21 DIAGNOSIS — J0191 Acute recurrent sinusitis, unspecified: Secondary | ICD-10-CM | POA: Diagnosis not present

## 2016-11-21 DIAGNOSIS — Z6841 Body Mass Index (BMI) 40.0 and over, adult: Secondary | ICD-10-CM | POA: Diagnosis not present

## 2016-11-26 DIAGNOSIS — J301 Allergic rhinitis due to pollen: Secondary | ICD-10-CM | POA: Diagnosis not present

## 2016-12-09 ENCOUNTER — Encounter: Payer: Medicare Other | Admitting: Registered Nurse

## 2016-12-10 DIAGNOSIS — J301 Allergic rhinitis due to pollen: Secondary | ICD-10-CM | POA: Diagnosis not present

## 2016-12-12 ENCOUNTER — Encounter: Payer: Self-pay | Admitting: Registered Nurse

## 2016-12-12 ENCOUNTER — Encounter: Payer: Medicare Other | Attending: Physical Medicine & Rehabilitation | Admitting: Registered Nurse

## 2016-12-12 VITALS — BP 140/84 | HR 83

## 2016-12-12 DIAGNOSIS — M47816 Spondylosis without myelopathy or radiculopathy, lumbar region: Secondary | ICD-10-CM | POA: Diagnosis not present

## 2016-12-12 DIAGNOSIS — F418 Other specified anxiety disorders: Secondary | ICD-10-CM | POA: Diagnosis not present

## 2016-12-12 DIAGNOSIS — N3010 Interstitial cystitis (chronic) without hematuria: Secondary | ICD-10-CM | POA: Diagnosis not present

## 2016-12-12 DIAGNOSIS — M545 Low back pain: Secondary | ICD-10-CM | POA: Insufficient documentation

## 2016-12-12 DIAGNOSIS — J45998 Other asthma: Secondary | ICD-10-CM | POA: Insufficient documentation

## 2016-12-12 DIAGNOSIS — G8929 Other chronic pain: Secondary | ICD-10-CM | POA: Diagnosis not present

## 2016-12-12 DIAGNOSIS — N393 Stress incontinence (female) (male): Secondary | ICD-10-CM | POA: Diagnosis not present

## 2016-12-12 DIAGNOSIS — E785 Hyperlipidemia, unspecified: Secondary | ICD-10-CM | POA: Insufficient documentation

## 2016-12-12 DIAGNOSIS — G894 Chronic pain syndrome: Secondary | ICD-10-CM | POA: Diagnosis not present

## 2016-12-12 DIAGNOSIS — G43909 Migraine, unspecified, not intractable, without status migrainosus: Secondary | ICD-10-CM | POA: Insufficient documentation

## 2016-12-12 DIAGNOSIS — Z79899 Other long term (current) drug therapy: Secondary | ICD-10-CM | POA: Diagnosis not present

## 2016-12-12 DIAGNOSIS — F419 Anxiety disorder, unspecified: Secondary | ICD-10-CM | POA: Diagnosis not present

## 2016-12-12 DIAGNOSIS — K589 Irritable bowel syndrome without diarrhea: Secondary | ICD-10-CM | POA: Diagnosis not present

## 2016-12-12 DIAGNOSIS — Z87442 Personal history of urinary calculi: Secondary | ICD-10-CM | POA: Diagnosis not present

## 2016-12-12 DIAGNOSIS — Z5181 Encounter for therapeutic drug level monitoring: Secondary | ICD-10-CM | POA: Diagnosis not present

## 2016-12-12 DIAGNOSIS — Z981 Arthrodesis status: Secondary | ICD-10-CM

## 2016-12-12 DIAGNOSIS — E669 Obesity, unspecified: Secondary | ICD-10-CM | POA: Insufficient documentation

## 2016-12-12 DIAGNOSIS — M62838 Other muscle spasm: Secondary | ICD-10-CM

## 2016-12-12 DIAGNOSIS — K219 Gastro-esophageal reflux disease without esophagitis: Secondary | ICD-10-CM | POA: Diagnosis not present

## 2016-12-12 DIAGNOSIS — M25551 Pain in right hip: Secondary | ICD-10-CM | POA: Insufficient documentation

## 2016-12-12 DIAGNOSIS — G2581 Restless legs syndrome: Secondary | ICD-10-CM | POA: Diagnosis not present

## 2016-12-12 DIAGNOSIS — M1990 Unspecified osteoarthritis, unspecified site: Secondary | ICD-10-CM | POA: Diagnosis not present

## 2016-12-12 DIAGNOSIS — M797 Fibromyalgia: Secondary | ICD-10-CM | POA: Diagnosis not present

## 2016-12-12 DIAGNOSIS — N920 Excessive and frequent menstruation with regular cycle: Secondary | ICD-10-CM | POA: Insufficient documentation

## 2016-12-12 DIAGNOSIS — M79671 Pain in right foot: Secondary | ICD-10-CM | POA: Insufficient documentation

## 2016-12-12 DIAGNOSIS — Z760 Encounter for issue of repeat prescription: Secondary | ICD-10-CM | POA: Insufficient documentation

## 2016-12-12 DIAGNOSIS — G4733 Obstructive sleep apnea (adult) (pediatric): Secondary | ICD-10-CM | POA: Insufficient documentation

## 2016-12-12 MED ORDER — HYDROCODONE-ACETAMINOPHEN 10-325 MG PO TABS: ORAL_TABLET | ORAL | 0 refills | Status: DC

## 2016-12-12 NOTE — Progress Notes (Signed)
Subjective:    Patient ID: Karina Greer, female    DOB: 22-Dec-1965, 51 y.o.   MRN: [id number]  HPI: [name] is a 51 year old female who returns for follow up appointment for chronic pain and medication refill. She states her pain is located in her lower back radiating into her buttocks. She rates her pain 9.Her current exercise regime is walking on Treadmill for 10 minutes 4-5 days a week, walking and performing stretching exercises.  S/p C5C6- C6-C7 Anterior Cervical Discectomy and Fusion, Allograft Plate by Dr. Lorin Mercy  Pain Inventory Average Pain 7 Pain Right Now 9 My pain is constant, sharp, stabbing and aching  In the last 24 hours, has pain interfered with the following? General activity 7 Relation with others 9 Enjoyment of life 9 What TIME of day is your pain at its worst? all Sleep (in general) Fair  Pain is worse with: walking, bending, sitting, inactivity, standing and unsure Pain improves with: rest, therapy/exercise, pacing activities, medication, TENS and injections Relief from Meds: 6  Mobility walk without assistance ability to climb steps?  yes do you drive?  yes  Function disabled: date disabled . I need assistance with the following:  meal prep, household duties and shopping  Neuro/Psych bladder control problems weakness numbness depression anxiety  Prior Studies Any changes since last visit?  no  Physicians involved in your care Any changes since last visit?  no   Family History  Problem Relation Age of Onset  . Hypertension Mother   . Diabetes Father   . Fibromyalgia Sister   . Hypertension Other     Cancer, Cerebrovascular disease run on mother side of family   Social History   Social History  . Marital status: Divorced    Spouse name: N/A  . Number of children: 3  . Years of education: HS   Occupational History  . Unemployed Unemployed    Disability   Social History Main Topics  . Smoking status: Never Smoker    . Smokeless tobacco: Never Used  . Alcohol use No  . Drug use: No  . Sexual activity: Not on file   Other Topics Concern  . Not on file   Social History Narrative  . No narrative on file   Past Surgical History:  Procedure Laterality Date  . ACHILLES TENDON SURGERY Right 3/16  . ANTERIOR CERVICAL DECOMP/DISCECTOMY FUSION N/A 03/11/2016   Procedure: C5-6, C6-7 Anterior Cervical Discectomy and Fusion, Allograft, Plate;  Surgeon: Marybelle Killings, MD;  Location: [facility];  Service: Orthopedics;  Laterality: N/A;  . CHOLECYSTECTOMY  2014  . CYSTO WITH HYDRODISTENSION N/A 01/26/2014   Procedure: CYSTOSCOPY/HYDRODISTENSION;  Surgeon: Bernestine Amass, MD;  Location: [facility];  Service: Urology;  Laterality: N/A;  . CYSTO/  URETHRAL DILATION/  HYDRODISTENTION/   INSTILLATION THERAPY  07-16-2010//   12-30-2007//   10-27-2006  . EXERCISE TOLERENCE TEST  10-12-2010   NEGATIVE  ADEQUATE ETT/  NO ISCHEMIA OR EVIDENCE HIGH GRADE OBSTRUCTIVE CAD/  NO FURTHER TEST NEEDED  . [place] ENDOMETRIAL ABLATION  2014  . LAPAROSCOPIC OVARIAN CYST BX  2005   AND  URETEROSCOPIC LASER LITHO  STONE EXTRACTION  . SHOULDER OPEN ROTATOR CUFF REPAIR Right 2004  . TONSILLECTOMY    . TRANSTHORACIC ECHOCARDIOGRAM  06-07-2006   normal study/  ef 60-65%  . TUBAL LIGATION Bilateral 1995   Past Medical History:  Diagnosis Date  . Anxiety   . Anxiety  disorder   . Arthritis   . Chronic low back pain   . Depression   . Dyslipidemia   . Fibromyalgia   . GERD (gastroesophageal reflux disease)   . Headache    hx migraines  . History of kidney stones   . History of panic attacks   . History of renal calculi   . IBS (irritable bowel syndrome)   . Interstitial cystitis   . Lupus    tested positive for antibodies for lupus. dr to do further tests  . Menorrhagia   . OSA on CPAP    not used cpap for several weeks  . Pneumonia    hx  . PONV (postoperative nausea and vomiting)   . RLS  (restless legs syndrome)   . Seasonal asthma   . SI (sacroiliac) joint dysfunction   . SUI (stress urinary incontinence, female)   . UTI (lower urinary tract infection)    hx  . 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 02/23/2013   There were no vitals taken for this visit.  Opioid Risk Score:   Fall Risk Score:  `1  Depression screen PHQ 2/9  Depression screen [facility] 2/9 12/12/2015 06/12/2015 04/17/2015 12/07/2014  Decreased Interest 3 3 3 3   Down, Depressed, Hopeless 1 3 2 2   PHQ - 2 Score 4 6 5 5   Altered sleeping 2 - - 3  Tired, decreased energy 3 - - 3  Change in appetite 3 - - 3  Feeling bad or failure about yourself  3 - - 1  Trouble concentrating 3 - - 3  Moving slowly or fidgety/restless 0 - - 1  Suicidal thoughts 0 - - 0  PHQ-9 Score 18 - - 19  Difficult doing work/chores Very difficult - - -    Review of Systems  Constitutional: Positive for appetite change.  HENT: Negative.   Eyes: Negative.   Respiratory: Negative.   Cardiovascular: Negative.   Gastrointestinal: Positive for nausea.  Endocrine: Negative.   Genitourinary: Negative.   Musculoskeletal: Negative.   Skin: Negative.   Allergic/Immunologic: Negative.   Neurological: Negative.   Hematological: Negative.   Psychiatric/Behavioral: Negative.   All other systems reviewed and are negative.      Objective:   Physical Exam  Constitutional: She is oriented to person, place, and time. She appears well-developed and well-nourished.  HENT:  Head: Normocephalic and atraumatic.  Neck: Normal range of motion. Neck supple.  Cardiovascular: Normal rate and regular rhythm.   Pulmonary/Chest: Effort normal and breath sounds normal.  Musculoskeletal:  Normal Muscle Bulk and Muscle Testing Reveals: Upper Extremities: Full ROM and Muscle Strength 5/5 Lumbar Paraspinal Tenderness: L-3-L-5 Lower Extremities: Full ROM and Muscle Strength 5/5 Arises from chair slowly Antalgic Gait  Neurological: She is alert and  oriented to person, place, and time.  Skin: Skin is warm and dry.  Psychiatric: She has a normal mood and affect.  Nursing note and vitals reviewed.         Assessment & Plan:  1. Lumbago: 12/12/16 Refilled: HYDROcodone 10/325mg  one tablet every 6 hours as needed #120.  We will continue the opioid monitoring program, this consists of regular clinic visits, examinations, urine drug screen, pill counts as well as use of [place] Controlled Substance Reporting System. 2. Fibromyalgia. Continue Current exercise Regime. 12/12/2016 3. Anxiety and depression:Psychiatrist Following at the [place] [facility] [place]. On Xanax and Prestiq. 12/12/2016 4. Migraines: On Maxalt. Neurology Following. 12/12/2016 5. OSA : Continue  exercise regime as tolerated and losing weight. 12/12/2016 6. Obesity: Following Healthy Diet Regimen. 12/12/2016 7. Status Post Cervical Spinal Fusion: C5C6- C-6- C-7 Anterior Cervical Discectomy Fusion and Allograft Plate.: Dr. Lorin Mercy Following. 12/12/2016 8. Muscle Spasm: Continue Tizanidine.11/22/2016  20 minutes of face to face patient care time was spent during this visit. All questions were encouraged and answered.  F/U in 1 month

## 2016-12-26 DIAGNOSIS — F411 Generalized anxiety disorder: Secondary | ICD-10-CM | POA: Diagnosis not present

## 2016-12-26 DIAGNOSIS — F331 Major depressive disorder, recurrent, moderate: Secondary | ICD-10-CM | POA: Diagnosis not present

## 2016-12-30 DIAGNOSIS — J301 Allergic rhinitis due to pollen: Secondary | ICD-10-CM | POA: Diagnosis not present

## 2016-12-30 DIAGNOSIS — J3081 Allergic rhinitis due to animal (cat) (dog) hair and dander: Secondary | ICD-10-CM | POA: Diagnosis not present

## 2016-12-30 DIAGNOSIS — J3089 Other allergic rhinitis: Secondary | ICD-10-CM | POA: Diagnosis not present

## 2017-01-07 ENCOUNTER — Encounter: Payer: Medicare Other | Attending: Physical Medicine & Rehabilitation | Admitting: Registered Nurse

## 2017-01-07 ENCOUNTER — Encounter: Payer: Self-pay | Admitting: Registered Nurse

## 2017-01-07 VITALS — BP 137/94 | HR 65

## 2017-01-07 DIAGNOSIS — Z87442 Personal history of urinary calculi: Secondary | ICD-10-CM | POA: Insufficient documentation

## 2017-01-07 DIAGNOSIS — F3289 Other specified depressive episodes: Secondary | ICD-10-CM

## 2017-01-07 DIAGNOSIS — M7062 Trochanteric bursitis, left hip: Secondary | ICD-10-CM

## 2017-01-07 DIAGNOSIS — E669 Obesity, unspecified: Secondary | ICD-10-CM | POA: Insufficient documentation

## 2017-01-07 DIAGNOSIS — M25552 Pain in left hip: Secondary | ICD-10-CM | POA: Diagnosis not present

## 2017-01-07 DIAGNOSIS — G894 Chronic pain syndrome: Secondary | ICD-10-CM | POA: Diagnosis not present

## 2017-01-07 DIAGNOSIS — F419 Anxiety disorder, unspecified: Secondary | ICD-10-CM | POA: Diagnosis not present

## 2017-01-07 DIAGNOSIS — F418 Other specified anxiety disorders: Secondary | ICD-10-CM | POA: Diagnosis not present

## 2017-01-07 DIAGNOSIS — N393 Stress incontinence (female) (male): Secondary | ICD-10-CM | POA: Diagnosis not present

## 2017-01-07 DIAGNOSIS — M25551 Pain in right hip: Secondary | ICD-10-CM | POA: Diagnosis not present

## 2017-01-07 DIAGNOSIS — N920 Excessive and frequent menstruation with regular cycle: Secondary | ICD-10-CM | POA: Diagnosis not present

## 2017-01-07 DIAGNOSIS — Z760 Encounter for issue of repeat prescription: Secondary | ICD-10-CM | POA: Diagnosis not present

## 2017-01-07 DIAGNOSIS — Z5181 Encounter for therapeutic drug level monitoring: Secondary | ICD-10-CM | POA: Diagnosis not present

## 2017-01-07 DIAGNOSIS — G8929 Other chronic pain: Secondary | ICD-10-CM | POA: Diagnosis not present

## 2017-01-07 DIAGNOSIS — G43909 Migraine, unspecified, not intractable, without status migrainosus: Secondary | ICD-10-CM | POA: Insufficient documentation

## 2017-01-07 DIAGNOSIS — J45998 Other asthma: Secondary | ICD-10-CM | POA: Diagnosis not present

## 2017-01-07 DIAGNOSIS — Z79899 Other long term (current) drug therapy: Secondary | ICD-10-CM

## 2017-01-07 DIAGNOSIS — K589 Irritable bowel syndrome without diarrhea: Secondary | ICD-10-CM | POA: Diagnosis not present

## 2017-01-07 DIAGNOSIS — M62838 Other muscle spasm: Secondary | ICD-10-CM | POA: Diagnosis not present

## 2017-01-07 DIAGNOSIS — G2581 Restless legs syndrome: Secondary | ICD-10-CM | POA: Diagnosis not present

## 2017-01-07 DIAGNOSIS — G4733 Obstructive sleep apnea (adult) (pediatric): Secondary | ICD-10-CM | POA: Diagnosis not present

## 2017-01-07 DIAGNOSIS — M545 Low back pain: Secondary | ICD-10-CM | POA: Diagnosis not present

## 2017-01-07 DIAGNOSIS — M1990 Unspecified osteoarthritis, unspecified site: Secondary | ICD-10-CM | POA: Diagnosis not present

## 2017-01-07 DIAGNOSIS — M797 Fibromyalgia: Secondary | ICD-10-CM | POA: Diagnosis not present

## 2017-01-07 DIAGNOSIS — Z981 Arthrodesis status: Secondary | ICD-10-CM | POA: Diagnosis not present

## 2017-01-07 DIAGNOSIS — K219 Gastro-esophageal reflux disease without esophagitis: Secondary | ICD-10-CM | POA: Insufficient documentation

## 2017-01-07 DIAGNOSIS — E785 Hyperlipidemia, unspecified: Secondary | ICD-10-CM | POA: Insufficient documentation

## 2017-01-07 DIAGNOSIS — N3010 Interstitial cystitis (chronic) without hematuria: Secondary | ICD-10-CM | POA: Insufficient documentation

## 2017-01-07 DIAGNOSIS — G43001 Migraine without aura, not intractable, with status migrainosus: Secondary | ICD-10-CM

## 2017-01-07 DIAGNOSIS — M79671 Pain in right foot: Secondary | ICD-10-CM | POA: Diagnosis not present

## 2017-01-07 DIAGNOSIS — M47816 Spondylosis without myelopathy or radiculopathy, lumbar region: Secondary | ICD-10-CM | POA: Diagnosis not present

## 2017-01-07 MED ORDER — HYDROCODONE-ACETAMINOPHEN 10-325 MG PO TABS: ORAL_TABLET | ORAL | 0 refills | Status: DC

## 2017-01-07 NOTE — Progress Notes (Signed)
Subjective:    Patient ID: Karina Greer, female    DOB: 05-15-1966, 51 y.o.   MRN: [id number]  HPI: [name] is a 51year old female who returns for follow up appointmentfor chronic pain and medication refill. She states her pain is located in her lower back radiating into her left hip. She rates her pain 6.Her current exercise regime is walking.  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    Last UDS was on 10/14/2016, it was consistent.   S/p C5C6- C6-C7 Anterior Cervical Discectomy and Fusion, Allograft Plate by Dr. Lorin Mercy   Pain Inventory Average Pain 8 Pain Right Now 6 My pain is constant, burning, stabbing and aching  In the last 24 hours, has pain interfered with the following? General activity 7 Relation with others 9 Enjoyment of life 10 What TIME of day is your pain at its worst? all Sleep (in general) Fair  Pain is worse with: walking, bending, sitting, inactivity, standing, unsure and some activites Pain improves with: rest, heat/ice, therapy/exercise, pacing activities, medication and TENS Relief from Meds: 7  Mobility walk without assistance ability to climb steps?  yes do you drive?  yes  Function I need assistance with the following:  meal prep, household duties and shopping  Neuro/Psych bladder control problems weakness anxiety  Prior Studies Any changes since last visit?  no  Physicians involved in your care Any changes since last visit?  no   Family History  Problem Relation Age of Onset  . Hypertension Mother   . Diabetes Father   . Fibromyalgia Sister   . Hypertension Other     Cancer, Cerebrovascular disease run on mother side of family   Social History   Social History  . Marital status: Divorced    Spouse name: N/A  . Number of children: 3  . Years of education: HS   Occupational History  . Unemployed Unemployed    Disability   Social History Main  Topics  . Smoking status: Never Smoker  . Smokeless tobacco: Never Used  . Alcohol use No  . Drug use: No  . Sexual activity: Not on file   Other Topics Concern  . Not on file   Social History Narrative  . No narrative on file   Past Surgical History:  Procedure Laterality Date  . ACHILLES TENDON SURGERY Right 3/16  . ANTERIOR CERVICAL DECOMP/DISCECTOMY FUSION N/A 03/11/2016   Procedure: C5-6, C6-7 Anterior Cervical Discectomy and Fusion, Allograft, Plate;  Surgeon: Marybelle Killings, MD;  Location: [facility];  Service: Orthopedics;  Laterality: N/A;  . CHOLECYSTECTOMY  2014  . CYSTO WITH HYDRODISTENSION N/A 01/26/2014   Procedure: CYSTOSCOPY/HYDRODISTENSION;  Surgeon: Bernestine Amass, MD;  Location: [facility];  Service: Urology;  Laterality: N/A;  . CYSTO/  URETHRAL DILATION/  HYDRODISTENTION/   INSTILLATION THERAPY  07-16-2010//   12-30-2007//   10-27-2006  . EXERCISE TOLERENCE TEST  10-12-2010   NEGATIVE  ADEQUATE ETT/  NO ISCHEMIA OR EVIDENCE HIGH GRADE OBSTRUCTIVE CAD/  NO FURTHER TEST NEEDED  . [place] ENDOMETRIAL ABLATION  2014  . LAPAROSCOPIC OVARIAN CYST BX  2005   AND  URETEROSCOPIC LASER LITHO  STONE EXTRACTION  . SHOULDER OPEN ROTATOR CUFF REPAIR Right 2004  . TONSILLECTOMY    . TRANSTHORACIC ECHOCARDIOGRAM  06-07-2006   normal study/  ef 60-65%  . TUBAL LIGATION Bilateral 1995  Past Medical History:  Diagnosis Date  . Anxiety   . Anxiety disorder   . Arthritis   . Chronic low back pain   . Depression   . Dyslipidemia   . Fibromyalgia   . GERD (gastroesophageal reflux disease)   . Headache    hx migraines  . History of kidney stones   . History of panic attacks   . History of renal calculi   . IBS (irritable bowel syndrome)   . Interstitial cystitis   . Lupus    tested positive for antibodies for lupus. dr to do further tests  . Menorrhagia   . OSA on CPAP    not used cpap for several weeks  . Pneumonia    hx  . PONV  (postoperative nausea and vomiting)   . RLS (restless legs syndrome)   . Seasonal asthma   . SI (sacroiliac) joint dysfunction   . SUI (stress urinary incontinence, female)   . UTI (lower urinary tract infection)    hx  . 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 02/23/2013   There were no vitals taken for this visit.  Opioid Risk Score:   Fall Risk Score:  `1  Depression screen PHQ 2/9  Depression screen [facility] 2/9 12/12/2015 06/12/2015 04/17/2015 12/07/2014  Decreased Interest 3 3 3 3   Down, Depressed, Hopeless 1 3 2 2   PHQ - 2 Score 4 6 5 5   Altered sleeping 2 - - 3  Tired, decreased energy 3 - - 3  Change in appetite 3 - - 3  Feeling bad or failure about yourself  3 - - 1  Trouble concentrating 3 - - 3  Moving slowly or fidgety/restless 0 - - 1  Suicidal thoughts 0 - - 0  PHQ-9 Score 18 - - 19  Difficult doing work/chores Very difficult - - -    Review of Systems  Constitutional: Negative.   HENT: Negative.   Eyes: Negative.   Respiratory: Positive for apnea.   Cardiovascular: Negative.   Gastrointestinal: Negative.   Endocrine: Negative.   Genitourinary: Negative.   Musculoskeletal: Negative.   Skin: Negative.   Allergic/Immunologic: Negative.   Neurological: Negative.   Hematological: Negative.   Psychiatric/Behavioral: Negative.   All other systems reviewed and are negative.      Objective:   Physical Exam  Constitutional: She is oriented to person, place, and time. She appears well-developed and well-nourished.  HENT:  Head: Normocephalic and atraumatic.  Neck: Normal range of motion. Neck supple.  Cardiovascular: Normal rate and regular rhythm.   Pulmonary/Chest: Effort normal and breath sounds normal.  Musculoskeletal:  Normal Muscle Bulk and Muscle Testing Reveals: Upper Extremities: Full ROM and Muscle Strength 5/5 Lumbar Paraspinal Tenderness: L-3-L-5 Left: Greater Trochanter Tenderness Lower Extremities: Full ROM and Muscle Strength 5/5 Arises from  Table with ease Narrow Based Gait  Neurological: She is alert and oriented to person, place, and time.  Skin: Skin is warm and dry.  Psychiatric: She has a normal mood and affect.  Nursing note and vitals reviewed.         Assessment & Plan:  1. Lumbago/ Lumbar Spondylosis: 01/07/17 Refilled: HYDROcodone 10/325mg  one tablet every 6 hours as needed #120.  We will continue the opioid monitoring program, this consists of regular clinic visits, examinations, urine drug screen, pill counts as well as use of [place] Controlled Substance Reporting System. 2. Fibromyalgia. Continue Current exercise Regime. 01/07/2017 3. Anxiety and depression:Psychiatrist Following at the [place]  at [facility]. On Xanax and Prestiq. 01/07/2017 4. Migraines: On Maxalt. Neurology Following. 01/07/2017 5. OSA : Continue exercise regime as tolerated and losing weight. 12/12/2016 6. Obesity: Following Healthy Diet Regimen. 01/07/2017 7. Status Post Cervical Spinal Fusion: C5C6- C-6- C-7 Anterior Cervical Discectomy Fusion and Allograft Plate.: Dr. Lorin Mercy Following. 01/07/2017 8. Muscle Spasm: Continue Tizanidine.01/07/2017 9. Left: Greater Trochanteric Bursitis: Continue with Ice Therapy  20 minutes of face to face patient care time was spent during this visit. All questions were encouraged and answered.  F/U in 37month

## 2017-01-16 DIAGNOSIS — J301 Allergic rhinitis due to pollen: Secondary | ICD-10-CM | POA: Diagnosis not present

## 2017-01-16 DIAGNOSIS — H02823 Cysts of right eye, unspecified eyelid: Secondary | ICD-10-CM | POA: Diagnosis not present

## 2017-01-27 DIAGNOSIS — F411 Generalized anxiety disorder: Secondary | ICD-10-CM | POA: Diagnosis not present

## 2017-01-27 DIAGNOSIS — F331 Major depressive disorder, recurrent, moderate: Secondary | ICD-10-CM | POA: Diagnosis not present

## 2017-01-30 DIAGNOSIS — K219 Gastro-esophageal reflux disease without esophagitis: Secondary | ICD-10-CM | POA: Diagnosis not present

## 2017-01-30 DIAGNOSIS — J301 Allergic rhinitis due to pollen: Secondary | ICD-10-CM | POA: Diagnosis not present

## 2017-01-30 DIAGNOSIS — J069 Acute upper respiratory infection, unspecified: Secondary | ICD-10-CM | POA: Diagnosis not present

## 2017-01-30 DIAGNOSIS — R0602 Shortness of breath: Secondary | ICD-10-CM | POA: Diagnosis not present

## 2017-01-30 DIAGNOSIS — G4733 Obstructive sleep apnea (adult) (pediatric): Secondary | ICD-10-CM | POA: Diagnosis not present

## 2017-02-04 ENCOUNTER — Encounter: Payer: Medicare Other | Attending: Physical Medicine & Rehabilitation | Admitting: Registered Nurse

## 2017-02-04 ENCOUNTER — Encounter: Payer: Self-pay | Admitting: Registered Nurse

## 2017-02-04 ENCOUNTER — Telehealth: Payer: Self-pay | Admitting: Registered Nurse

## 2017-02-04 VITALS — BP 129/87 | HR 77

## 2017-02-04 DIAGNOSIS — N393 Stress incontinence (female) (male): Secondary | ICD-10-CM | POA: Diagnosis not present

## 2017-02-04 DIAGNOSIS — E785 Hyperlipidemia, unspecified: Secondary | ICD-10-CM | POA: Insufficient documentation

## 2017-02-04 DIAGNOSIS — J45998 Other asthma: Secondary | ICD-10-CM | POA: Diagnosis not present

## 2017-02-04 DIAGNOSIS — Z5181 Encounter for therapeutic drug level monitoring: Secondary | ICD-10-CM

## 2017-02-04 DIAGNOSIS — G894 Chronic pain syndrome: Secondary | ICD-10-CM

## 2017-02-04 DIAGNOSIS — M7062 Trochanteric bursitis, left hip: Secondary | ICD-10-CM | POA: Diagnosis not present

## 2017-02-04 DIAGNOSIS — M5416 Radiculopathy, lumbar region: Secondary | ICD-10-CM

## 2017-02-04 DIAGNOSIS — Z87442 Personal history of urinary calculi: Secondary | ICD-10-CM | POA: Diagnosis not present

## 2017-02-04 DIAGNOSIS — M545 Low back pain: Secondary | ICD-10-CM | POA: Diagnosis not present

## 2017-02-04 DIAGNOSIS — M797 Fibromyalgia: Secondary | ICD-10-CM | POA: Diagnosis not present

## 2017-02-04 DIAGNOSIS — N920 Excessive and frequent menstruation with regular cycle: Secondary | ICD-10-CM | POA: Insufficient documentation

## 2017-02-04 DIAGNOSIS — Z981 Arthrodesis status: Secondary | ICD-10-CM

## 2017-02-04 DIAGNOSIS — K589 Irritable bowel syndrome without diarrhea: Secondary | ICD-10-CM | POA: Insufficient documentation

## 2017-02-04 DIAGNOSIS — M62838 Other muscle spasm: Secondary | ICD-10-CM | POA: Diagnosis not present

## 2017-02-04 DIAGNOSIS — E669 Obesity, unspecified: Secondary | ICD-10-CM | POA: Insufficient documentation

## 2017-02-04 DIAGNOSIS — K219 Gastro-esophageal reflux disease without esophagitis: Secondary | ICD-10-CM | POA: Insufficient documentation

## 2017-02-04 DIAGNOSIS — Z79899 Other long term (current) drug therapy: Secondary | ICD-10-CM

## 2017-02-04 DIAGNOSIS — M25552 Pain in left hip: Secondary | ICD-10-CM | POA: Diagnosis not present

## 2017-02-04 DIAGNOSIS — N3010 Interstitial cystitis (chronic) without hematuria: Secondary | ICD-10-CM | POA: Insufficient documentation

## 2017-02-04 DIAGNOSIS — F418 Other specified anxiety disorders: Secondary | ICD-10-CM | POA: Insufficient documentation

## 2017-02-04 DIAGNOSIS — F419 Anxiety disorder, unspecified: Secondary | ICD-10-CM

## 2017-02-04 DIAGNOSIS — M79671 Pain in right foot: Secondary | ICD-10-CM | POA: Diagnosis not present

## 2017-02-04 DIAGNOSIS — G2581 Restless legs syndrome: Secondary | ICD-10-CM | POA: Insufficient documentation

## 2017-02-04 DIAGNOSIS — M25551 Pain in right hip: Secondary | ICD-10-CM | POA: Diagnosis not present

## 2017-02-04 DIAGNOSIS — M1990 Unspecified osteoarthritis, unspecified site: Secondary | ICD-10-CM | POA: Insufficient documentation

## 2017-02-04 DIAGNOSIS — M47816 Spondylosis without myelopathy or radiculopathy, lumbar region: Secondary | ICD-10-CM | POA: Diagnosis not present

## 2017-02-04 DIAGNOSIS — G43909 Migraine, unspecified, not intractable, without status migrainosus: Secondary | ICD-10-CM | POA: Insufficient documentation

## 2017-02-04 DIAGNOSIS — Z760 Encounter for issue of repeat prescription: Secondary | ICD-10-CM | POA: Insufficient documentation

## 2017-02-04 DIAGNOSIS — G8929 Other chronic pain: Secondary | ICD-10-CM | POA: Insufficient documentation

## 2017-02-04 DIAGNOSIS — G4733 Obstructive sleep apnea (adult) (pediatric): Secondary | ICD-10-CM | POA: Diagnosis not present

## 2017-02-04 MED ORDER — HYDROCODONE-ACETAMINOPHEN 10-325 MG PO TABS: ORAL_TABLET | ORAL | 0 refills | Status: DC

## 2017-02-04 NOTE — Telephone Encounter (Signed)
On 02/04/2017 the [place] was reviewed no conflict was seen on the [place] with multiple prescribers. Ms. Wentland has a signed narcotic contract with our office. If there were any discrepancies this would have been reported to her physician.

## 2017-02-04 NOTE — Progress Notes (Signed)
Subjective:    Patient ID: Karina Greer, female    DOB: 02-01-1966, 51 y.o.   MRN: [id number]  HPI: [name] is a 51year old female who returns for follow up appointmentfor chronic pain and medication refill. She states her pain is located in her lower back radiating into her left hip.Also states she has right ankle pain. Ms. Dail states she fell last week, she was walking outside her home when her foot went into a whole, she lost her balanced and landed on her right side. She was able to get up on her own. She didn't seek medical attention. She denies X-ray, states she will follow up with Dr. Lorin Mercy. She rates her pain 6.Her current exercise regime is walking.   Last UDS was on 10/14/2016, it was consistent.   S/P C5C6- C6-C7 Anterior Cervical Discectomy and Fusion, Allograft Plate by Dr. Lorin Mercy    Pain Inventory Average Pain 8 Pain Right Now 6 My pain is intermittent, constant, sharp, burning, dull, stabbing, tingling and aching  In the last 24 hours, has pain interfered with the following? General activity 7 Relation with others 6 Enjoyment of life 7 What TIME of day is your pain at its worst? daytime evening and night Sleep (in general) Fair  Pain is worse with: walking, bending, sitting, inactivity, standing and some activites Pain improves with: rest, heat/ice, therapy/exercise, pacing activities and medication Relief from Meds: 7  Mobility walk without assistance how many minutes can you walk? 10 ability to climb steps?  yes do you drive?  yes  Function I need assistance with the following:  meal prep, household duties and shopping  Neuro/Psych numbness spasms dizziness depression anxiety  Prior Studies Any changes since last visit?  no  Physicians involved in your care Any changes since last visit?  no   Family History  Problem Relation Age of Onset  . Hypertension Mother   . Diabetes Father   . Fibromyalgia Sister   . Hypertension  Other        Cancer, Cerebrovascular disease run on mother side of family   Social History   Social History  . Marital status: Divorced    Spouse name: N/A  . Number of children: 3  . Years of education: HS   Occupational History  . Unemployed Unemployed    Disability   Social History Main Topics  . Smoking status: Never Smoker  . Smokeless tobacco: Never Used  . Alcohol use No  . Drug use: No  . Sexual activity: Not Asked   Other Topics Concern  . None   Social History Narrative  . None   Past Surgical History:  Procedure Laterality Date  . ACHILLES TENDON SURGERY Right 3/16  . ANTERIOR CERVICAL DECOMP/DISCECTOMY FUSION N/A 03/11/2016   Procedure: C5-6, C6-7 Anterior Cervical Discectomy and Fusion, Allograft, Plate;  Surgeon: Marybelle Killings, MD;  Location: [facility];  Service: Orthopedics;  Laterality: N/A;  . CHOLECYSTECTOMY  2014  . CYSTO WITH HYDRODISTENSION N/A 01/26/2014   Procedure: CYSTOSCOPY/HYDRODISTENSION;  Surgeon: Bernestine Amass, MD;  Location: [facility];  Service: Urology;  Laterality: N/A;  . CYSTO/  URETHRAL DILATION/  HYDRODISTENTION/   INSTILLATION THERAPY  07-16-2010//   12-30-2007//   10-27-2006  . EXERCISE TOLERENCE TEST  10-12-2010   NEGATIVE  ADEQUATE ETT/  NO ISCHEMIA OR EVIDENCE HIGH GRADE OBSTRUCTIVE CAD/  NO FURTHER TEST NEEDED  . [place] ENDOMETRIAL ABLATION  2014  . LAPAROSCOPIC  OVARIAN CYST BX  2005   AND  URETEROSCOPIC LASER LITHO  STONE EXTRACTION  . SHOULDER OPEN ROTATOR CUFF REPAIR Right 2004  . TONSILLECTOMY    . TRANSTHORACIC ECHOCARDIOGRAM  06-07-2006   normal study/  ef 60-65%  . TUBAL LIGATION Bilateral 1995   Past Medical History:  Diagnosis Date  . Anxiety   . Anxiety disorder   . Arthritis   . Chronic low back pain   . Depression   . Dyslipidemia   . Fibromyalgia   . GERD (gastroesophageal reflux disease)   . Headache    hx migraines  . History of kidney stones   . History of panic attacks    . History of renal calculi   . IBS (irritable bowel syndrome)   . Interstitial cystitis   . Lupus    tested positive for antibodies for lupus. dr to do further tests  . Menorrhagia   . OSA on CPAP    not used cpap for several weeks  . Pneumonia    hx  . PONV (postoperative nausea and vomiting)   . RLS (restless legs syndrome)   . Seasonal asthma   . SI (sacroiliac) joint dysfunction   . SUI (stress urinary incontinence, female)   . UTI (lower urinary tract infection)    hx  . 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 02/23/2013   BP 129/87   Pulse 77   SpO2 97%   Opioid Risk Score:   Fall Risk Score:  `1  Depression screen PHQ 2/9  Depression screen [facility] 2/9 02/04/2017 12/12/2015 06/12/2015 04/17/2015 12/07/2014  Decreased Interest 3 3 3 3 3   Down, Depressed, Hopeless 3 1 3 2 2   PHQ - 2 Score 6 4 6 5 5   Altered sleeping - 2 - - 3  Tired, decreased energy - 3 - - 3  Change in appetite - 3 - - 3  Feeling bad or failure about yourself  - 3 - - 1  Trouble concentrating - 3 - - 3  Moving slowly or fidgety/restless - 0 - - 1  Suicidal thoughts - 0 - - 0  PHQ-9 Score - 18 - - 19  Difficult doing work/chores - Very difficult - - -   Review of Systems  Constitutional: Positive for appetite change.  HENT: Negative.   Eyes: Negative.   Respiratory: Negative.   Cardiovascular: Negative.   Gastrointestinal: Positive for nausea.  Endocrine: Negative.   Genitourinary: Negative.   Musculoskeletal:       Spasms  Skin: Negative.   Allergic/Immunologic: Negative.   Neurological: Positive for dizziness and numbness.  Hematological: Negative.   Psychiatric/Behavioral: Positive for dysphoric mood. The patient is nervous/anxious.   All other systems reviewed and are negative.      Objective:   Physical Exam  Constitutional: She is oriented to person, place, and time. She appears well-developed and well-nourished.  HENT:  Head: Normocephalic and atraumatic.  Neck: Normal range of  motion. Neck supple.  Cardiovascular: Normal rate and regular rhythm.   Pulmonary/Chest: Effort normal and breath sounds normal.  Musculoskeletal:  Normal Muscle Bulk and Muscle Testing Reveals: Upper Extremities: Full ROM and Muscle Strength 5/5 Lumbar Paraspinal Tenderness: L-3-L-5 Mainly Left Side Left Greater Trochanter Tenderness Lower Extremities: Full ROM and Muscle Strength 5/5 Arises from Table with ease Narrow Based Gait   Neurological: She is alert and oriented to person, place, and time.  Skin: Skin is warm and dry.  Psychiatric: She has a normal mood  and affect.  Nursing note and vitals reviewed.         Assessment & Plan:  1. Lumbago/ Lumbar Spondylosis: 02/04/17 Refilled: HYDROcodone 10/325mg  one tablet every 6 hours as needed #120.  We will continue the opioid monitoring program, this consists of regular clinic visits, examinations, urine drug screen, pill counts as well as use of [place] Controlled Substance Reporting System. 2. Fibromyalgia. Continue Current exercise Regime. 02/04/2017 3. Anxiety and depression:Psychiatrist Following at the [place] [facility] at [facility]. On Xanax and Viibryd. 02/04/2017 4. Migraines: On Maxalt. Neurology Following. 02/04/2017 5. OSA : Continue exercise regime as tolerated and losing weight. 02/04/2017 6. Obesity: Following Healthy Diet Regimen. 02/04/2017 7. Status Post Cervical Spinal Fusion: C5C6- C-6- C-7 Anterior Cervical Discectomy Fusion and Allograft Plate.: Dr. Lorin Mercy Following. 02/04/2017 8. Muscle Spasm: Continue Tizanidine.02/04/2017 9. Left: Greater Trochanteric Bursitis: Continue with Ice Therapy. 02/04/2017  20 minutes of face to face patient care time was spent during this visit. All questions were encouraged and answered.   F/U in 12month

## 2017-02-07 DIAGNOSIS — F411 Generalized anxiety disorder: Secondary | ICD-10-CM | POA: Diagnosis not present

## 2017-02-07 DIAGNOSIS — F331 Major depressive disorder, recurrent, moderate: Secondary | ICD-10-CM | POA: Diagnosis not present

## 2017-02-08 LAB — TOXASSURE SELECT,+ANTIDEPR,UR

## 2017-02-10 ENCOUNTER — Telehealth: Payer: Self-pay | Admitting: *Deleted

## 2017-02-10 NOTE — Telephone Encounter (Signed)
Urine drug screen for this encounter is consistent for prescribed medication 

## 2017-02-12 DIAGNOSIS — H9203 Otalgia, bilateral: Secondary | ICD-10-CM | POA: Diagnosis not present

## 2017-02-12 DIAGNOSIS — Z6841 Body Mass Index (BMI) 40.0 and over, adult: Secondary | ICD-10-CM | POA: Diagnosis not present

## 2017-02-12 DIAGNOSIS — N390 Urinary tract infection, site not specified: Secondary | ICD-10-CM | POA: Diagnosis not present

## 2017-02-19 DIAGNOSIS — R0602 Shortness of breath: Secondary | ICD-10-CM | POA: Diagnosis not present

## 2017-02-19 DIAGNOSIS — J301 Allergic rhinitis due to pollen: Secondary | ICD-10-CM | POA: Diagnosis not present

## 2017-02-20 DIAGNOSIS — F411 Generalized anxiety disorder: Secondary | ICD-10-CM | POA: Diagnosis not present

## 2017-02-20 DIAGNOSIS — F331 Major depressive disorder, recurrent, moderate: Secondary | ICD-10-CM | POA: Diagnosis not present

## 2017-02-24 DIAGNOSIS — F331 Major depressive disorder, recurrent, moderate: Secondary | ICD-10-CM | POA: Diagnosis not present

## 2017-02-24 DIAGNOSIS — F411 Generalized anxiety disorder: Secondary | ICD-10-CM | POA: Diagnosis not present

## 2017-03-05 ENCOUNTER — Encounter (INDEPENDENT_AMBULATORY_CARE_PROVIDER_SITE_OTHER): Payer: Self-pay | Admitting: Orthopaedic Surgery

## 2017-03-05 ENCOUNTER — Ambulatory Visit (INDEPENDENT_AMBULATORY_CARE_PROVIDER_SITE_OTHER): Payer: Medicare Other | Admitting: Orthopaedic Surgery

## 2017-03-05 ENCOUNTER — Ambulatory Visit (INDEPENDENT_AMBULATORY_CARE_PROVIDER_SITE_OTHER): Payer: Medicare Other

## 2017-03-05 VITALS — BP 143/101 | HR 75 | Ht 63.0 in | Wt 225.0 lb

## 2017-03-05 DIAGNOSIS — M2550 Pain in unspecified joint: Secondary | ICD-10-CM

## 2017-03-05 DIAGNOSIS — M542 Cervicalgia: Secondary | ICD-10-CM

## 2017-03-05 DIAGNOSIS — J301 Allergic rhinitis due to pollen: Secondary | ICD-10-CM | POA: Diagnosis not present

## 2017-03-05 DIAGNOSIS — G4733 Obstructive sleep apnea (adult) (pediatric): Secondary | ICD-10-CM | POA: Diagnosis not present

## 2017-03-05 DIAGNOSIS — M25532 Pain in left wrist: Secondary | ICD-10-CM | POA: Diagnosis not present

## 2017-03-05 LAB — CBC
HCT: 45 % (ref 35.0–45.0)
Hemoglobin: 14.6 g/dL (ref 11.7–15.5)
MCH: 28.5 pg (ref 27.0–33.0)
MCHC: 32.4 g/dL (ref 32.0–36.0)
MCV: 87.9 fL (ref 80.0–100.0)
MPV: 9.4 fL (ref 7.5–12.5)
Platelets: 339 10*3/uL (ref 140–400)
RBC: 5.12 MIL/uL — ABNORMAL HIGH (ref 3.80–5.10)
RDW: 13.2 % (ref 11.0–15.0)
WBC: 9.1 10*3/uL (ref 3.8–10.8)

## 2017-03-05 LAB — URIC ACID: Uric Acid, Serum: 3.3 mg/dL (ref 2.5–7.0)
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
## 2017-03-06 LAB — ANA: Anti Nuclear Antibody(ANA): NEGATIVE

## 2017-03-06 LAB — C-REACTIVE PROTEIN: CRP: 5.8 mg/L (ref <8.0)

## 2017-03-06 LAB — RHEUMATOID FACTOR: Rhuematoid fact SerPl-aCnc: 14 IU/mL — ABNORMAL HIGH (ref <14)

## 2017-03-06 LAB — SEDIMENTATION RATE: Sed Rate: 22 mm/hr (ref 0–30)

## 2017-03-10 ENCOUNTER — Encounter: Payer: Medicare Other | Attending: Physical Medicine & Rehabilitation | Admitting: Registered Nurse

## 2017-03-10 ENCOUNTER — Encounter: Payer: Self-pay | Admitting: Registered Nurse

## 2017-03-10 VITALS — BP 142/85 | HR 74 | Resp 14

## 2017-03-10 DIAGNOSIS — F418 Other specified anxiety disorders: Secondary | ICD-10-CM | POA: Diagnosis not present

## 2017-03-10 DIAGNOSIS — M797 Fibromyalgia: Secondary | ICD-10-CM

## 2017-03-10 DIAGNOSIS — Z87442 Personal history of urinary calculi: Secondary | ICD-10-CM | POA: Diagnosis not present

## 2017-03-10 DIAGNOSIS — M62838 Other muscle spasm: Secondary | ICD-10-CM

## 2017-03-10 DIAGNOSIS — N3010 Interstitial cystitis (chronic) without hematuria: Secondary | ICD-10-CM | POA: Insufficient documentation

## 2017-03-10 DIAGNOSIS — Z981 Arthrodesis status: Secondary | ICD-10-CM

## 2017-03-10 DIAGNOSIS — G4733 Obstructive sleep apnea (adult) (pediatric): Secondary | ICD-10-CM | POA: Insufficient documentation

## 2017-03-10 DIAGNOSIS — Z5181 Encounter for therapeutic drug level monitoring: Secondary | ICD-10-CM

## 2017-03-10 DIAGNOSIS — M545 Low back pain: Secondary | ICD-10-CM | POA: Insufficient documentation

## 2017-03-10 DIAGNOSIS — E669 Obesity, unspecified: Secondary | ICD-10-CM | POA: Insufficient documentation

## 2017-03-10 DIAGNOSIS — G2581 Restless legs syndrome: Secondary | ICD-10-CM | POA: Diagnosis not present

## 2017-03-10 DIAGNOSIS — M25551 Pain in right hip: Secondary | ICD-10-CM | POA: Insufficient documentation

## 2017-03-10 DIAGNOSIS — G43909 Migraine, unspecified, not intractable, without status migrainosus: Secondary | ICD-10-CM | POA: Diagnosis not present

## 2017-03-10 DIAGNOSIS — N920 Excessive and frequent menstruation with regular cycle: Secondary | ICD-10-CM | POA: Diagnosis not present

## 2017-03-10 DIAGNOSIS — M79671 Pain in right foot: Secondary | ICD-10-CM | POA: Insufficient documentation

## 2017-03-10 DIAGNOSIS — G8929 Other chronic pain: Secondary | ICD-10-CM | POA: Insufficient documentation

## 2017-03-10 DIAGNOSIS — G894 Chronic pain syndrome: Secondary | ICD-10-CM | POA: Diagnosis not present

## 2017-03-10 DIAGNOSIS — Z760 Encounter for issue of repeat prescription: Secondary | ICD-10-CM | POA: Insufficient documentation

## 2017-03-10 DIAGNOSIS — K589 Irritable bowel syndrome without diarrhea: Secondary | ICD-10-CM | POA: Diagnosis not present

## 2017-03-10 DIAGNOSIS — M1990 Unspecified osteoarthritis, unspecified site: Secondary | ICD-10-CM | POA: Insufficient documentation

## 2017-03-10 DIAGNOSIS — E785 Hyperlipidemia, unspecified: Secondary | ICD-10-CM | POA: Diagnosis not present

## 2017-03-10 DIAGNOSIS — M47816 Spondylosis without myelopathy or radiculopathy, lumbar region: Secondary | ICD-10-CM

## 2017-03-10 DIAGNOSIS — N393 Stress incontinence (female) (male): Secondary | ICD-10-CM | POA: Insufficient documentation

## 2017-03-10 DIAGNOSIS — J45998 Other asthma: Secondary | ICD-10-CM | POA: Diagnosis not present

## 2017-03-10 DIAGNOSIS — M5416 Radiculopathy, lumbar region: Secondary | ICD-10-CM

## 2017-03-10 DIAGNOSIS — K219 Gastro-esophageal reflux disease without esophagitis: Secondary | ICD-10-CM | POA: Diagnosis not present

## 2017-03-10 DIAGNOSIS — M542 Cervicalgia: Secondary | ICD-10-CM | POA: Diagnosis not present

## 2017-03-10 DIAGNOSIS — Z79899 Other long term (current) drug therapy: Secondary | ICD-10-CM | POA: Diagnosis not present

## 2017-03-10 DIAGNOSIS — F3289 Other specified depressive episodes: Secondary | ICD-10-CM

## 2017-03-10 DIAGNOSIS — F419 Anxiety disorder, unspecified: Secondary | ICD-10-CM | POA: Diagnosis not present

## 2017-03-10 MED ORDER — HYDROCODONE-ACETAMINOPHEN 10-325 MG PO TABS: ORAL_TABLET | ORAL | 0 refills | Status: DC
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
## 2017-03-11 DIAGNOSIS — Z6841 Body Mass Index (BMI) 40.0 and over, adult: Secondary | ICD-10-CM | POA: Diagnosis not present

## 2017-03-11 DIAGNOSIS — F411 Generalized anxiety disorder: Secondary | ICD-10-CM | POA: Diagnosis not present

## 2017-03-11 DIAGNOSIS — F331 Major depressive disorder, recurrent, moderate: Secondary | ICD-10-CM | POA: Diagnosis not present

## 2017-03-11 DIAGNOSIS — R05 Cough: Secondary | ICD-10-CM | POA: Diagnosis not present

## 2017-03-11 DIAGNOSIS — L989 Disorder of the skin and subcutaneous tissue, unspecified: Secondary | ICD-10-CM | POA: Diagnosis not present

## 2017-03-18 ENCOUNTER — Encounter (INDEPENDENT_AMBULATORY_CARE_PROVIDER_SITE_OTHER): Payer: Self-pay | Admitting: Orthopaedic Surgery

## 2017-03-18 ENCOUNTER — Ambulatory Visit (INDEPENDENT_AMBULATORY_CARE_PROVIDER_SITE_OTHER): Payer: Medicare Other | Admitting: Orthopaedic Surgery

## 2017-03-18 DIAGNOSIS — M542 Cervicalgia: Secondary | ICD-10-CM
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
## 2017-03-20 DIAGNOSIS — J301 Allergic rhinitis due to pollen: Secondary | ICD-10-CM | POA: Diagnosis not present

## 2017-03-20 DIAGNOSIS — Z136 Encounter for screening for cardiovascular disorders: Secondary | ICD-10-CM | POA: Diagnosis not present

## 2017-03-20 DIAGNOSIS — Z0000 Encounter for general adult medical examination without abnormal findings: Secondary | ICD-10-CM | POA: Diagnosis not present

## 2017-03-20 DIAGNOSIS — Z1389 Encounter for screening for other disorder: Secondary | ICD-10-CM | POA: Diagnosis not present

## 2017-03-20 DIAGNOSIS — E785 Hyperlipidemia, unspecified: Secondary | ICD-10-CM | POA: Diagnosis not present

## 2017-03-20 DIAGNOSIS — Z1231 Encounter for screening mammogram for malignant neoplasm of breast: Secondary | ICD-10-CM | POA: Diagnosis not present

## 2017-03-20 DIAGNOSIS — Z6841 Body Mass Index (BMI) 40.0 and over, adult: Secondary | ICD-10-CM | POA: Diagnosis not present

## 2017-03-20 DIAGNOSIS — Z9181 History of falling: Secondary | ICD-10-CM | POA: Diagnosis not present

## 2017-03-20 DIAGNOSIS — Z23 Encounter for immunization: Secondary | ICD-10-CM | POA: Diagnosis not present

## 2017-03-20 DIAGNOSIS — M8580 Other specified disorders of bone density and structure, unspecified site: Secondary | ICD-10-CM | POA: Diagnosis not present

## 2017-03-25 ENCOUNTER — Ambulatory Visit (INDEPENDENT_AMBULATORY_CARE_PROVIDER_SITE_OTHER): Payer: Medicare Other | Admitting: Orthopaedic Surgery

## 2017-03-25 ENCOUNTER — Ambulatory Visit
Admission: RE | Admit: 2017-03-25 | Discharge: 2017-03-25 | Disposition: A | Discharge status: Home / Self Care | Payer: Medicare Other | Source: Ambulatory Visit | Attending: Orthopaedic Surgery | Admitting: Orthopaedic Surgery

## 2017-03-25 DIAGNOSIS — M4322 Fusion of spine, cervical region: Secondary | ICD-10-CM | POA: Diagnosis not present

## 2017-03-25 DIAGNOSIS — M542 Cervicalgia: Secondary | ICD-10-CM

## 2017-04-01 ENCOUNTER — Encounter (INDEPENDENT_AMBULATORY_CARE_PROVIDER_SITE_OTHER): Payer: Self-pay | Admitting: Orthopaedic Surgery

## 2017-04-01 ENCOUNTER — Ambulatory Visit (INDEPENDENT_AMBULATORY_CARE_PROVIDER_SITE_OTHER): Payer: Medicare Other | Admitting: Orthopaedic Surgery

## 2017-04-01 VITALS — BP 130/86 | HR 71 | Temp 97.6°F | Ht 63.0 in | Wt 225.0 lb

## 2017-04-01 DIAGNOSIS — M542 Cervicalgia: Secondary | ICD-10-CM

## 2017-04-01 DIAGNOSIS — Z981 Arthrodesis status: Secondary | ICD-10-CM
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
## 2017-04-09 ENCOUNTER — Encounter: Payer: Medicare Other | Attending: Physical Medicine & Rehabilitation | Admitting: Registered Nurse

## 2017-04-09 ENCOUNTER — Encounter: Payer: Self-pay | Admitting: Registered Nurse

## 2017-04-09 VITALS — BP 147/78 | HR 73

## 2017-04-09 DIAGNOSIS — M797 Fibromyalgia: Secondary | ICD-10-CM | POA: Diagnosis not present

## 2017-04-09 DIAGNOSIS — G609 Hereditary and idiopathic neuropathy, unspecified: Secondary | ICD-10-CM

## 2017-04-09 DIAGNOSIS — K589 Irritable bowel syndrome without diarrhea: Secondary | ICD-10-CM | POA: Insufficient documentation

## 2017-04-09 DIAGNOSIS — G2581 Restless legs syndrome: Secondary | ICD-10-CM | POA: Insufficient documentation

## 2017-04-09 DIAGNOSIS — M25551 Pain in right hip: Secondary | ICD-10-CM | POA: Insufficient documentation

## 2017-04-09 DIAGNOSIS — M545 Low back pain: Secondary | ICD-10-CM | POA: Insufficient documentation

## 2017-04-09 DIAGNOSIS — G4733 Obstructive sleep apnea (adult) (pediatric): Secondary | ICD-10-CM | POA: Diagnosis not present

## 2017-04-09 DIAGNOSIS — J45998 Other asthma: Secondary | ICD-10-CM | POA: Insufficient documentation

## 2017-04-09 DIAGNOSIS — F418 Other specified anxiety disorders: Secondary | ICD-10-CM | POA: Insufficient documentation

## 2017-04-09 DIAGNOSIS — F419 Anxiety disorder, unspecified: Secondary | ICD-10-CM

## 2017-04-09 DIAGNOSIS — G8929 Other chronic pain: Secondary | ICD-10-CM | POA: Diagnosis not present

## 2017-04-09 DIAGNOSIS — E785 Hyperlipidemia, unspecified: Secondary | ICD-10-CM | POA: Insufficient documentation

## 2017-04-09 DIAGNOSIS — M47816 Spondylosis without myelopathy or radiculopathy, lumbar region: Secondary | ICD-10-CM | POA: Diagnosis not present

## 2017-04-09 DIAGNOSIS — Z780 Asymptomatic menopausal state: Secondary | ICD-10-CM | POA: Diagnosis not present

## 2017-04-09 DIAGNOSIS — M7061 Trochanteric bursitis, right hip: Secondary | ICD-10-CM | POA: Diagnosis not present

## 2017-04-09 DIAGNOSIS — G43001 Migraine without aura, not intractable, with status migrainosus: Secondary | ICD-10-CM | POA: Diagnosis not present

## 2017-04-09 DIAGNOSIS — M810 Age-related osteoporosis without current pathological fracture: Secondary | ICD-10-CM | POA: Diagnosis not present

## 2017-04-09 DIAGNOSIS — M1990 Unspecified osteoarthritis, unspecified site: Secondary | ICD-10-CM | POA: Diagnosis not present

## 2017-04-09 DIAGNOSIS — M62838 Other muscle spasm: Secondary | ICD-10-CM | POA: Diagnosis not present

## 2017-04-09 DIAGNOSIS — Z87442 Personal history of urinary calculi: Secondary | ICD-10-CM | POA: Diagnosis not present

## 2017-04-09 DIAGNOSIS — M542 Cervicalgia: Secondary | ICD-10-CM | POA: Diagnosis not present

## 2017-04-09 DIAGNOSIS — Z981 Arthrodesis status: Secondary | ICD-10-CM | POA: Diagnosis not present

## 2017-04-09 DIAGNOSIS — G894 Chronic pain syndrome: Secondary | ICD-10-CM | POA: Diagnosis not present

## 2017-04-09 DIAGNOSIS — Z760 Encounter for issue of repeat prescription: Secondary | ICD-10-CM | POA: Insufficient documentation

## 2017-04-09 DIAGNOSIS — J301 Allergic rhinitis due to pollen: Secondary | ICD-10-CM | POA: Diagnosis not present

## 2017-04-09 DIAGNOSIS — M7062 Trochanteric bursitis, left hip: Secondary | ICD-10-CM

## 2017-04-09 DIAGNOSIS — N3010 Interstitial cystitis (chronic) without hematuria: Secondary | ICD-10-CM | POA: Insufficient documentation

## 2017-04-09 DIAGNOSIS — M79671 Pain in right foot: Secondary | ICD-10-CM | POA: Diagnosis not present

## 2017-04-09 DIAGNOSIS — K219 Gastro-esophageal reflux disease without esophagitis: Secondary | ICD-10-CM | POA: Insufficient documentation

## 2017-04-09 DIAGNOSIS — G43909 Migraine, unspecified, not intractable, without status migrainosus: Secondary | ICD-10-CM | POA: Diagnosis not present

## 2017-04-09 DIAGNOSIS — N920 Excessive and frequent menstruation with regular cycle: Secondary | ICD-10-CM | POA: Insufficient documentation

## 2017-04-09 DIAGNOSIS — N393 Stress incontinence (female) (male): Secondary | ICD-10-CM | POA: Diagnosis not present

## 2017-04-09 DIAGNOSIS — E669 Obesity, unspecified: Secondary | ICD-10-CM | POA: Diagnosis not present

## 2017-04-09 DIAGNOSIS — Z1231 Encounter for screening mammogram for malignant neoplasm of breast: Secondary | ICD-10-CM | POA: Diagnosis not present

## 2017-04-09 MED ORDER — HYDROCODONE-ACETAMINOPHEN 10-325 MG PO TABS: ORAL_TABLET | ORAL | 0 refills | Status: DC

## 2017-04-09 NOTE — Progress Notes (Signed)
Subjective:    Patient ID: Karina Greer, female    DOB: December 01, 1965, 51 y.o.   MRN: [id number]  HPI: Karina Greer is a 51year old female who returns for follow up appointmentfor chronic pain and medication refill. She states her pain is located in her neck radiating into her bilateral arms and left wrist, also has lower back pain radiating into her bilateral hips L>R and lower extremities.Karina Greer states she has burning in her bilateral feet.     In the past [name] has been prescribed Gabapentin Side effect weight gain. Lyrica,: hallucination, Amitriptyline: hallucinations, Cymbalta facial swelling. She was prescribed Topamax in the past she can't recall if she had an adverse reaction. Will order EMG with Dr. Naaman Plummer regarding her neuropathic pain and TENS Unit ordered, she verbalizes understanding.   Last UDS was performed on 02/04/2017, it was consistent.    Pain Inventory Average Pain 8 Pain Right Now 7 My pain is constant, sharp, stabbing and tingling  In the last 24 hours, has pain interfered with the following? General activity 7 Relation with others 8 Enjoyment of life 9 What TIME of day is your pain at its worst? daytime evening Sleep (in general) Fair  Pain is worse with: walking, bending, sitting, inactivity, standing and some activites Pain improves with: rest, heat/ice, therapy/exercise, pacing activities, medication and TENS Relief from Meds: 6  Mobility walk without assistance how many minutes can you walk? 10 ability to climb steps?  yes do you drive?  yes  Function disabled: date disabled . I need assistance with the following:  meal prep, household duties and shopping  Neuro/Psych numbness tingling trouble walking dizziness depression anxiety  Prior Studies Any changes since last visit?  no  Physicians involved in your care Any changes since last visit?  no   Family History  Problem Relation Age of Onset  . Hypertension Mother    . Diabetes Father   . Fibromyalgia Sister   . Hypertension Other        Cancer, Cerebrovascular disease run on mother side of family   Social History   Social History  . Marital status: Divorced    Spouse name: N/A  . Number of children: 3  . Years of education: HS   Occupational History  . Unemployed Unemployed    Disability   Social History Main Topics  . Smoking status: Never Smoker  . Smokeless tobacco: Never Used  . Alcohol use No  . Drug use: No  . Sexual activity: Not Asked   Other Topics Concern  . None   Social History Narrative  . None   Past Surgical History:  Procedure Laterality Date  . ACHILLES TENDON SURGERY Right 3/16  . ANTERIOR CERVICAL DECOMP/DISCECTOMY FUSION N/A 03/11/2016   Procedure: C5-6, C6-7 Anterior Cervical Discectomy and Fusion, Allograft, Plate;  Surgeon: Marybelle Killings, MD;  Location: [facility];  Service: Orthopedics;  Laterality: N/A;  . CHOLECYSTECTOMY  2014  . CYSTO WITH HYDRODISTENSION N/A 01/26/2014   Procedure: CYSTOSCOPY/HYDRODISTENSION;  Surgeon: Bernestine Amass, MD;  Location: [facility];  Service: Urology;  Laterality: N/A;  . CYSTO/  URETHRAL DILATION/  HYDRODISTENTION/   INSTILLATION THERAPY  07-16-2010//   12-30-2007//   10-27-2006  . EXERCISE TOLERENCE TEST  10-12-2010   NEGATIVE  ADEQUATE ETT/  NO ISCHEMIA OR EVIDENCE HIGH GRADE OBSTRUCTIVE CAD/  NO FURTHER TEST NEEDED  . [place] ENDOMETRIAL ABLATION  2014  . LAPAROSCOPIC OVARIAN CYST  BX  2005   AND  URETEROSCOPIC LASER LITHO  STONE EXTRACTION  . SHOULDER OPEN ROTATOR CUFF REPAIR Right 2004  . TONSILLECTOMY    . TRANSTHORACIC ECHOCARDIOGRAM  06-07-2006   normal study/  ef 60-65%  . TUBAL LIGATION Bilateral 1995   Past Medical History:  Diagnosis Date  . Anxiety   . Anxiety disorder   . Arthritis   . Chronic low back pain   . Depression   . Dyslipidemia   . Fibromyalgia   . GERD (gastroesophageal reflux disease)   . Headache    hx  migraines  . History of kidney stones   . History of panic attacks   . History of renal calculi   . IBS (irritable bowel syndrome)   . Interstitial cystitis   . Lupus    tested positive for antibodies for lupus. dr to do further tests  . Menorrhagia   . OSA on CPAP    not used cpap for several weeks  . Pneumonia    hx  . PONV (postoperative nausea and vomiting)   . RLS (restless legs syndrome)   . Seasonal asthma   . SI (sacroiliac) joint dysfunction   . SUI (stress urinary incontinence, female)   . UTI (lower urinary tract infection)    hx  . 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 02/23/2013   BP (!) 147/78   Pulse 73   SpO2 98%   Opioid Risk Score:  7 Fall Risk Score:  `1  Depression screen PHQ 2/9  Depression screen [facility] 2/9 04/09/2017 02/04/2017 12/12/2015 06/12/2015 04/17/2015 12/07/2014  Decreased Interest 3 3 3 3 3 3   Down, Depressed, Hopeless 1 3 1 3 2 2   PHQ - 2 Score 4 6 4 6 5 5   Altered sleeping - - 2 - - 3  Tired, decreased energy - - 3 - - 3  Change in appetite - - 3 - - 3  Feeling bad or failure about yourself  - - 3 - - 1  Trouble concentrating - - 3 - - 3  Moving slowly or fidgety/restless - - 0 - - 1  Suicidal thoughts - - 0 - - 0  PHQ-9 Score - - 18 - - 19  Difficult doing work/chores - - Very difficult - - -    Review of Systems  Constitutional: Positive for appetite change.  HENT: Negative.   Eyes: Negative.   Respiratory: Positive for apnea and cough.   Cardiovascular: Negative.   Gastrointestinal: Positive for nausea.  Endocrine: Negative.   Genitourinary: Negative.   Musculoskeletal: Positive for gait problem.  Skin: Negative.   Allergic/Immunologic: Negative.   Neurological: Positive for dizziness and numbness.       Tingling  Hematological: Negative.   Psychiatric/Behavioral: Positive for dysphoric mood. The patient is nervous/anxious.   All other systems reviewed and are negative.      Objective:   Physical Exam  Constitutional:  She is oriented to person, place, and time. She appears well-developed and well-nourished.  HENT:  Head: Normocephalic and atraumatic.  Neck: Normal range of motion. Neck supple.  Cervical Paraspinal Tenderness: C-5-C-6   Cardiovascular: Normal rate and regular rhythm.   Pulmonary/Chest: Effort normal and breath sounds normal.  Musculoskeletal:  Normal Muscle Bulk and Muscle Testing Reveals: Upper Extremities: Full ROM and Muscle Strength 5/5 Bilateral AC Joint Tenderness Spinal Forward Flexion: 45 Degrees and Extension 20 Degrees Thoracic Paraspinal Tenderness: T-1-T-3 Lumbar Paraspinal Tenderness: L-3-L-5 Lower Extremities: Full ROM and Muscle  Strength 5/5 Arises from Table slowly Antalgic Gait    Neurological: She is alert and oriented to person, place, and time.  Skin: Skin is warm and dry.  Psychiatric: She has a normal mood and affect.  Nursing note and vitals reviewed.         Assessment & Plan:  1. Lumbago/ Lumbar Spondylosis: 04/09/17 Refilled: HYDROcodone 10/325mg  one tablet every 6 hours as needed #120.  We will continue the opioid monitoring program, this consists of regular clinic visits, examinations, urine drug screen, pill counts as well as use of [place] Controlled Substance Reporting System. 2. Fibromyalgia. Continue Current exercise Regime. 04/09/2017 3. Anxiety and depression:Psychiatrist Following: Dr. Jordan Hawks  at the [facility]. Continue Counseling at [facility]. On Xanax.  04/09/2017 4. Migraines: On Maxalt. Neurology Following. 04/09/2017 5. OSA : Continue exercise regime as tolerated and losing weight. 04/09/2017 6. Obesity: Following Healthy Diet Regimen. 04/09/2017 7. Status Post Cervical Spinal Fusion: C5C6- C-6- C-7 Anterior Cervical Discectomy Fusion and Allograft Plate: Dr. Lorin Mercy Following. 04/09/2017 8. Cervicalgia: Dr. Lorin Mercy Following 04/09/2017 9. Muscle Spasm: Continue Tizanidine.04/09/2017 10.Bilateral Greater Trochanteric  Bursitis: Continue with Ice Therapy. 04/09/2017 11.Hereditary and Idiopathic Peripheral Neuropathy: RX: Tens Unit and  EMG with Dr. Naaman Plummer.  40 minutes of face to face patient care time was spent during this visit. All questions were encouraged and answered.   F/U in 4month

## 2017-04-09 NOTE — Patient Instructions (Signed)
Peripheral Neuropathy Peripheral neuropathy is a type of nerve damage. It affects nerves that carry signals between the spinal cord and other parts of the body. These are called peripheral nerves. With peripheral neuropathy, one nerve or a group of nerves may be damaged. What are the causes? Many things can damage peripheral nerves. For some people with peripheral neuropathy, the cause is unknown. Some causes include:  Diabetes. This is the most common cause of peripheral neuropathy.  Injury to a nerve.  Pressure or stress on a nerve that lasts a long time.  Too little vitamin B. Alcoholism can lead to this.  Infections.  Autoimmune diseases, such as multiple sclerosis and systemic lupus erythematosus.  Inherited nerve diseases.  Some medicines, such as cancer drugs.  Toxic substances, such as lead and mercury.  Too little blood flowing to the legs.  Kidney disease.  Thyroid disease.  What are the signs or symptoms? Different people have different symptoms. The symptoms you have will depend on which of your nerves is damaged. Common symptoms include:  Loss of feeling (numbness) in the feet and hands.  Tingling in the feet and hands.  Pain that burns.  Very sensitive skin.  Weakness.  Not being able to move a part of the body (paralysis).  Muscle twitching.  Clumsiness or poor coordination.  Loss of balance.  Not being able to control your bladder.  Feeling dizzy.  Sexual problems.  How is this diagnosed? Peripheral neuropathy is a symptom, not a disease. Finding the cause of peripheral neuropathy can be hard. To figure that out, your health care provider will take a medical history and do a physical exam. A neurological exam will also be done. This involves checking things affected by your brain, spinal cord, and nerves (nervous system). For example, your health care provider will check your reflexes, how you move, and what you can feel. Other types of tests  may also be ordered, such as:  Blood tests.  A test of the fluid in your spinal cord.  Imaging tests, such as CT scans or an MRI.  Electromyography (EMG). This test checks the nerves that control muscles.  Nerve conduction velocity tests. These tests check how fast messages pass through your nerves.  Nerve biopsy. A small piece of nerve is removed. It is then checked under a microscope.  How is this treated?  Medicine is often used to treat peripheral neuropathy. Medicines may include: ? Pain-relieving medicines. Prescription or over-the-counter medicine may be suggested. ? Antiseizure medicine. This may be used for pain. ? Antidepressants. These also may help ease pain from neuropathy. ? Lidocaine. This is a numbing medicine. You might wear a patch or be given a shot. ? Mexiletine. This medicine is typically used to help control irregular heart rhythms.  Surgery. Surgery may be needed to relieve pressure on a nerve or to destroy a nerve that is causing pain.  Physical therapy to help movement.  Assistive devices to help movement. Follow these instructions at home:  Only take over-the-counter or prescription medicines as directed by your health care provider. Follow the instructions carefully for any given medicines. Do not take any other medicines without first getting approval from your health care provider.  If you have diabetes, work closely with your health care provider to keep your blood sugar under control.  If you have numbness in your feet: ? Check every day for signs of injury or infection. Watch for redness, warmth, and swelling. ? Wear padded socks and comfortable   shoes. These help protect your feet.  Do not do things that put pressure on your damaged nerve.  Do not smoke. Smoking keeps blood from getting to damaged nerves.  Avoid or limit alcohol. Too much alcohol can cause a lack of B vitamins. These vitamins are needed for healthy nerves.  Develop a good  support system. Coping with peripheral neuropathy can be stressful. Talk to a mental health specialist or join a support group if you are struggling.  Follow up with your health care provider as directed. Contact a health care provider if:  You have new signs or symptoms of peripheral neuropathy.  You are struggling emotionally from dealing with peripheral neuropathy.  You have a fever. Get help right away if:  You have an injury or infection that is not healing.  You feel very dizzy or begin vomiting.  You have chest pain.  You have trouble breathing. This information is not intended to replace advice given to you by your health care provider. Make sure you discuss any questions you have with your health care provider. Document Released: 08/30/2002 Document Revised: 02/15/2016 Document Reviewed: 05/17/2013 Elsevier Interactive Patient Education  2017 Elsevier Inc.  

## 2017-04-24 DIAGNOSIS — F331 Major depressive disorder, recurrent, moderate: Secondary | ICD-10-CM | POA: Diagnosis not present

## 2017-04-24 DIAGNOSIS — F411 Generalized anxiety disorder: Secondary | ICD-10-CM | POA: Diagnosis not present

## 2017-04-24 MED FILL — HYDROCODON-APAP 10-325: 10-325 | 30 days supply | Qty: 120 | Fill #0

## 2017-04-29 DIAGNOSIS — E559 Vitamin D deficiency, unspecified: Secondary | ICD-10-CM | POA: Diagnosis not present

## 2017-04-29 DIAGNOSIS — E041 Nontoxic single thyroid nodule: Secondary | ICD-10-CM | POA: Diagnosis not present

## 2017-04-29 DIAGNOSIS — I10 Essential (primary) hypertension: Secondary | ICD-10-CM | POA: Diagnosis not present

## 2017-04-29 DIAGNOSIS — R238 Other skin changes: Secondary | ICD-10-CM | POA: Diagnosis not present

## 2017-04-29 DIAGNOSIS — M810 Age-related osteoporosis without current pathological fracture: Secondary | ICD-10-CM | POA: Diagnosis not present

## 2017-04-29 DIAGNOSIS — Z6841 Body Mass Index (BMI) 40.0 and over, adult: Secondary | ICD-10-CM | POA: Diagnosis not present

## 2017-05-01 DIAGNOSIS — J301 Allergic rhinitis due to pollen: Secondary | ICD-10-CM | POA: Diagnosis not present

## 2017-05-05 ENCOUNTER — Other Ambulatory Visit: Payer: Self-pay | Admitting: Physician Assistant

## 2017-05-05 DIAGNOSIS — E041 Nontoxic single thyroid nodule: Secondary | ICD-10-CM

## 2017-05-07 ENCOUNTER — Ambulatory Visit
Admission: RE | Admit: 2017-05-07 | Discharge: 2017-05-07 | Disposition: A | Discharge status: Home / Self Care | Payer: Medicare Other | Source: Ambulatory Visit | Attending: Physician Assistant | Admitting: Physician Assistant

## 2017-05-07 DIAGNOSIS — Z6841 Body Mass Index (BMI) 40.0 and over, adult: Secondary | ICD-10-CM | POA: Diagnosis not present

## 2017-05-07 DIAGNOSIS — R42 Dizziness and giddiness: Secondary | ICD-10-CM | POA: Diagnosis not present

## 2017-05-07 DIAGNOSIS — E042 Nontoxic multinodular goiter: Secondary | ICD-10-CM | POA: Diagnosis not present

## 2017-05-07 DIAGNOSIS — E041 Nontoxic single thyroid nodule: Secondary | ICD-10-CM

## 2017-05-09 DIAGNOSIS — F411 Generalized anxiety disorder: Secondary | ICD-10-CM | POA: Diagnosis not present

## 2017-05-09 DIAGNOSIS — F331 Major depressive disorder, recurrent, moderate: Secondary | ICD-10-CM | POA: Diagnosis not present

## 2017-05-20 ENCOUNTER — Encounter: Payer: Self-pay | Admitting: Physical Medicine & Rehabilitation

## 2017-05-20 ENCOUNTER — Encounter: Payer: Medicare Other | Attending: Physical Medicine & Rehabilitation | Admitting: Physical Medicine & Rehabilitation

## 2017-05-20 VITALS — BP 149/82 | HR 82

## 2017-05-20 DIAGNOSIS — E669 Obesity, unspecified: Secondary | ICD-10-CM | POA: Diagnosis not present

## 2017-05-20 DIAGNOSIS — F418 Other specified anxiety disorders: Secondary | ICD-10-CM | POA: Diagnosis not present

## 2017-05-20 DIAGNOSIS — M797 Fibromyalgia: Secondary | ICD-10-CM | POA: Insufficient documentation

## 2017-05-20 DIAGNOSIS — F419 Anxiety disorder, unspecified: Secondary | ICD-10-CM

## 2017-05-20 DIAGNOSIS — R202 Paresthesia of skin: Secondary | ICD-10-CM | POA: Insufficient documentation

## 2017-05-20 DIAGNOSIS — M1990 Unspecified osteoarthritis, unspecified site: Secondary | ICD-10-CM | POA: Diagnosis not present

## 2017-05-20 DIAGNOSIS — Z87442 Personal history of urinary calculi: Secondary | ICD-10-CM | POA: Diagnosis not present

## 2017-05-20 DIAGNOSIS — R200 Anesthesia of skin: Secondary | ICD-10-CM | POA: Diagnosis not present

## 2017-05-20 DIAGNOSIS — Z760 Encounter for issue of repeat prescription: Secondary | ICD-10-CM | POA: Diagnosis not present

## 2017-05-20 DIAGNOSIS — M25551 Pain in right hip: Secondary | ICD-10-CM | POA: Diagnosis not present

## 2017-05-20 DIAGNOSIS — E785 Hyperlipidemia, unspecified: Secondary | ICD-10-CM | POA: Diagnosis not present

## 2017-05-20 DIAGNOSIS — N3010 Interstitial cystitis (chronic) without hematuria: Secondary | ICD-10-CM | POA: Diagnosis not present

## 2017-05-20 DIAGNOSIS — N393 Stress incontinence (female) (male): Secondary | ICD-10-CM | POA: Diagnosis not present

## 2017-05-20 DIAGNOSIS — K589 Irritable bowel syndrome without diarrhea: Secondary | ICD-10-CM | POA: Diagnosis not present

## 2017-05-20 DIAGNOSIS — G4733 Obstructive sleep apnea (adult) (pediatric): Secondary | ICD-10-CM | POA: Diagnosis not present

## 2017-05-20 DIAGNOSIS — K219 Gastro-esophageal reflux disease without esophagitis: Secondary | ICD-10-CM | POA: Diagnosis not present

## 2017-05-20 DIAGNOSIS — G8929 Other chronic pain: Secondary | ICD-10-CM | POA: Diagnosis not present

## 2017-05-20 DIAGNOSIS — M79671 Pain in right foot: Secondary | ICD-10-CM | POA: Diagnosis not present

## 2017-05-20 DIAGNOSIS — J45998 Other asthma: Secondary | ICD-10-CM | POA: Diagnosis not present

## 2017-05-20 DIAGNOSIS — M545 Low back pain: Secondary | ICD-10-CM | POA: Diagnosis not present

## 2017-05-20 DIAGNOSIS — N920 Excessive and frequent menstruation with regular cycle: Secondary | ICD-10-CM | POA: Diagnosis not present

## 2017-05-20 DIAGNOSIS — G43909 Migraine, unspecified, not intractable, without status migrainosus: Secondary | ICD-10-CM | POA: Diagnosis not present

## 2017-05-20 DIAGNOSIS — G2581 Restless legs syndrome: Secondary | ICD-10-CM | POA: Diagnosis not present

## 2017-05-20 MED ORDER — HYDROCODONE-ACETAMINOPHEN 10-325 MG PO TABS: ORAL_TABLET | ORAL | 0 refills | Status: DC

## 2017-05-20 NOTE — Progress Notes (Signed)
Lower Extremity Nerve Conduction Study Reason for Exam:  TINGLING IN BOTH FEET WITH NUMBNESS/PAIN ICD10 Code: R20.0, R20.2   Exam: Bilateral lower extremity nerve conduction screen was performed. The bilateral posterior tibial motor, peroneal motor, and sural sensory nerves were examined. The patient was prepped and placed in position. Skin was deemed adequately warm for the exam.  Using the extensor digitorum brevis for the active electrode site I stimulated the peroneal nerve orthodromically both at the ankle as well as at the fibular head. In both left and right lower extremities the peroneal nerve was normal for amplitude and nerve conduction velocity.  I used the abductor hallucis muscle as the active electrode site for stimulation of the tibial nerve. The tibial nerve was then stimulated proximally at the medial malleolus as well as in the popliteal fossa. The tibial nerve was normal for amplitude and nerve conduction velocity. Lastly I used the interspace between the heel and the lateral malleolus as a recording site for the sural sensory nerve. The nerve was then stimulated 14cm proximally at the calf. The sural nerve was normal for distal latency and amplitude in both legs. Report of results is attached. All distal latencies were normal in bilateral legs. NCV's in both tibial and peroneal nerves were 46-60m/s   Conclusion: Normal study. Nothing to account for tingling and numbness in feet.   I suspect this is related to her fibromyalgia. She will also be following up with her surgeon regarding her cervical spine. Results were reviewed with Harle Battiest, MD, [place]

## 2017-05-20 NOTE — Patient Instructions (Signed)
CONTINUE WORKING ON EXERCISE AND DIET   TRY TAI CHI!!!   MAKE SURE YOU HAVE THE RIGHT SHOES.

## 2017-05-22 DIAGNOSIS — J301 Allergic rhinitis due to pollen: Secondary | ICD-10-CM | POA: Diagnosis not present

## 2017-05-28 DIAGNOSIS — G629 Polyneuropathy, unspecified: Secondary | ICD-10-CM | POA: Diagnosis not present

## 2017-05-28 DIAGNOSIS — S43401A Unspecified sprain of right shoulder joint, initial encounter: Secondary | ICD-10-CM | POA: Diagnosis not present

## 2017-05-28 DIAGNOSIS — Z79899 Other long term (current) drug therapy: Secondary | ICD-10-CM | POA: Diagnosis not present

## 2017-05-28 DIAGNOSIS — O24419 Gestational diabetes mellitus in pregnancy, unspecified control: Secondary | ICD-10-CM | POA: Diagnosis not present

## 2017-05-28 DIAGNOSIS — R1032 Left lower quadrant pain: Secondary | ICD-10-CM | POA: Diagnosis not present

## 2017-05-28 DIAGNOSIS — S8000XA Contusion of unspecified knee, initial encounter: Secondary | ICD-10-CM | POA: Diagnosis not present

## 2017-05-28 DIAGNOSIS — R7303 Prediabetes: Secondary | ICD-10-CM | POA: Diagnosis not present

## 2017-05-28 DIAGNOSIS — M810 Age-related osteoporosis without current pathological fracture: Secondary | ICD-10-CM | POA: Diagnosis not present

## 2017-05-28 MED FILL — HYDROCODON-APAP 10-325: 10-325 | 30 days supply | Qty: 120 | Fill #0

## 2017-05-30 ENCOUNTER — Ambulatory Visit (INDEPENDENT_AMBULATORY_CARE_PROVIDER_SITE_OTHER): Payer: Medicare Other

## 2017-05-30 ENCOUNTER — Ambulatory Visit (INDEPENDENT_AMBULATORY_CARE_PROVIDER_SITE_OTHER): Payer: Medicare Other | Admitting: Orthopaedic Surgery

## 2017-05-30 ENCOUNTER — Encounter (INDEPENDENT_AMBULATORY_CARE_PROVIDER_SITE_OTHER): Payer: Self-pay | Admitting: Orthopaedic Surgery

## 2017-05-30 DIAGNOSIS — M25562 Pain in left knee: Secondary | ICD-10-CM

## 2017-05-30 DIAGNOSIS — M542 Cervicalgia: Secondary | ICD-10-CM

## 2017-05-30 DIAGNOSIS — M25511 Pain in right shoulder: Secondary | ICD-10-CM

## 2017-05-30 NOTE — Progress Notes (Signed)
Office Visit Note   Patient: Karina Greer           Date of Birth: February 21, 1966           MRN: [id number] Visit Date: 05/30/2017              Requested by: Cyndi Bender, PA-C [address] [place],  [id number] PCP: Cyndi Bender, PA-C   Assessment & Plan: Visit Diagnoses:  1. Neck pain   2. Acute pain of right shoulder   3. Acute pain of left knee      Fall with the anterior chest wall right shoulder and left knee contusions.  Plan: X-ray results were reviewed with patient. Negative for acute injury. She gradually resume her full therapy and normal workout a walking program. Follow-up if she has persistent symptoms.  Follow-Up Instructions: Return if symptoms worsen or fail to improve.   Orders:  Orders Placed This Encounter  Procedures  . XR Knee 1-2 Views Left  . XR Shoulder Right  . XR Cervical Spine 2 or 3 views   No orders of the defined types were placed in this encounter.     Procedures: No procedures performed   Clinical Data: No additional findings.   Subjective: Chief Complaint  Patient presents with  . Neck - Pain  . Right Shoulder - Pain  . Left Knee - Pain    HPI patient was walking think she may have missed a step or turned her ankle she fell bruising her anterior right ribs with pain in her shoulder that had previous surgery. She has a diagnosis of osteoporosis was max tends to give her reflux problems and she is waiting on approval for 6 months biphosphonate injection approval. Patient's been using some cervical traction intermittently for some residual neck symptoms after her cervical fusion last year C5-6 C6-7. She's gotten up about 6 pounds of traction she's working on weight loss and try to do some light hand weights in addition to walking program and dieting. She has an appointment coming up with the nutritionist. Data fall was 05/23/2017. She's not been evaluated since the fall. She has some pain with inspiration for 48 hours but none  since that time.  Review of Systems 14 point review of systems updated unchanged from last office visit other than as mentioned in history of present illness.   Objective: Vital Signs: There were no vitals taken for this visit.  Physical Exam  Constitutional: She is oriented to person, place, and time. She appears well-developed.  HENT:  Head: Normocephalic.  Right Ear: External ear normal.  Left Ear: External ear normal.  Eyes: Pupils are equal, round, and reactive to light.  Neck: No tracheal deviation present. No thyromegaly present.  Cardiovascular: Normal rate.   Pulmonary/Chest: Effort normal.  Abdominal: Soft.  Musculoskeletal:  Patient has a little bit of tightness with neck rotation well-healed anterior cervical incision. Drop arm-get her arm on the right upper over head. Upper extremity reflexes are 1+ and symmetrical. No acid motor weakness. There is some bruising over the anterior chest wall fourth fifth rib area just medial to the anterior axillary line. No rales no dyspnea. Normal heel-to-toe gait. Small abrasion over her left knee. No knee effusion. Good range of motion of her knee. Ligamentous exam is normal.  Neurological: She is alert and oriented to person, place, and time.  Skin: Skin is warm and dry.  Psychiatric: She has a normal mood and affect. Her behavior is normal.  Ortho Exam  Specialty Comments:  No specialty comments available.  Imaging: Xr Knee 1-2 Views Left  Result Date: 05/30/2017 AP lateral left knee x-rays obtained and reviewed. AP both knees. These show normal architecture no acute fracture changes. No joint effusion no arthritic changes. Impression: X-rays are negative for acute changes left knee  Xr Cervical Spine 2 Or 3 Views  Result Date: 05/30/2017 AP lateral cervical spine x-rays are obtained and reviewed the shows previous fusion C5-6 C-6-7. No acute fracture changes. There is progressive graft incorporation from previous x-rays months  earlier. Impression: Satisfactory cervical spine x-rays without evidence of acute change. Previous C5-6, C6-7 fusion.  Xr Shoulder Right  Result Date: 05/30/2017 Multiple views right shoulder obtained there is some slight sclerosis of the greater tuberosity consistent with patient's previous rotator cuff surgery. No acute fracture changes are seen. Right lung is fully inflated. No rib fractures. Impression: Normal right shoulder x-rays other than mild calcium at the rotator cuff insertion site of the greater tuberosity likely related to postoperative changes.    PMFS History: Patient Active Problem List   Diagnosis Date Noted  . S/P cervical spinal fusion 03/11/2016  . Anxiety 04/18/2014  . Chronic arthritis 04/18/2014  . Cystitis 04/18/2014  . Extreme obesity 04/18/2014  . Fibromyalgia 04/18/2014  . 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 Cephalalgia 04/04/2014  . Cervical pain 04/04/2014  . Numbness and tingling 04/04/2014  . 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 Other nonspecific abnormal result of function study of brain and central nervous system 07/01/2012  . Disturbance of skin sensation 07/01/2012  . Migraine without aura 07/01/2012  . Lumbar spondylosis 02/04/2012  . Myalgia and myositis 02/04/2012  . Depression 02/04/2012  . OBESITY, UNSPECIFIED 10/03/2010  . GENERALIZED ANXIETY DISORDER 10/03/2010  . CHEST PAIN, PRECORDIAL 10/03/2010   Past Medical History:  Diagnosis Date  . Anxiety   . Anxiety disorder   . Arthritis   . Chronic low back pain   . Depression   . Dyslipidemia   . Fibromyalgia   . GERD (gastroesophageal reflux disease)   . Headache    hx migraines  . History of kidney stones   . History of panic attacks   . History of renal calculi   . IBS (irritable bowel syndrome)   . Interstitial cystitis   . Lupus    tested positive for antibodies for lupus. dr to do further tests    . Menorrhagia   . OSA on CPAP    not used cpap for several weeks  . Pneumonia    hx  . PONV (postoperative nausea and vomiting)   . RLS (restless legs syndrome)   . Seasonal asthma   . SI (sacroiliac) joint dysfunction   . SUI (stress urinary incontinence, female)   . UTI (lower urinary tract infection)    hx  . 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 Hypertension Mother   . Diabetes Father   . Fibromyalgia Sister   . Hypertension Other        Cancer, Cerebrovascular disease run on mother side of family    Past Surgical History:  Procedure Laterality Date  . ACHILLES TENDON SURGERY Right 3/16  . ANTERIOR CERVICAL DECOMP/DISCECTOMY FUSION N/A 03/11/2016   Procedure: C5-6, C6-7 Anterior Cervical Discectomy and Fusion, Allograft, Plate;  Surgeon: Marybelle Killings, MD;  Location: [facility];  Service: Orthopedics;  Laterality: N/A;  . CHOLECYSTECTOMY  2014  . CYSTO WITH HYDRODISTENSION N/A 01/26/2014   Procedure: CYSTOSCOPY/HYDRODISTENSION;  Surgeon: Bernestine Amass, MD;  Location: [facility];  Service: Urology;  Laterality: N/A;  . CYSTO/  URETHRAL DILATION/  HYDRODISTENTION/   INSTILLATION THERAPY  07-16-2010//   12-30-2007//   10-27-2006  . EXERCISE TOLERENCE TEST  10-12-2010   NEGATIVE  ADEQUATE ETT/  NO ISCHEMIA OR EVIDENCE HIGH GRADE OBSTRUCTIVE CAD/  NO FURTHER TEST NEEDED  . [place] ENDOMETRIAL ABLATION  2014  . LAPAROSCOPIC OVARIAN CYST BX  2005   AND  URETEROSCOPIC LASER LITHO  STONE EXTRACTION  . SHOULDER OPEN ROTATOR CUFF REPAIR Right 2004  . TONSILLECTOMY    . TRANSTHORACIC ECHOCARDIOGRAM  06-07-2006   normal study/  ef 60-65%  . TUBAL LIGATION Bilateral 1995   Social History   Occupational History  . Unemployed Unemployed    Disability   Social History Main Topics  . Smoking status: Never Smoker  . Smokeless tobacco: Never Used  . Alcohol use No  . Drug use: No  . Sexual  activity: Not on file

## 2017-06-02 ENCOUNTER — Other Ambulatory Visit: Payer: Self-pay | Admitting: Nurse Practitioner

## 2017-06-02 ENCOUNTER — Telehealth: Payer: Self-pay | Admitting: Physical Medicine & Rehabilitation

## 2017-06-02 DIAGNOSIS — R8290 Unspecified abnormal findings in urine: Secondary | ICD-10-CM | POA: Diagnosis not present

## 2017-06-02 DIAGNOSIS — N3000 Acute cystitis without hematuria: Secondary | ICD-10-CM | POA: Diagnosis not present

## 2017-06-02 DIAGNOSIS — R1032 Left lower quadrant pain: Secondary | ICD-10-CM | POA: Diagnosis not present

## 2017-06-02 DIAGNOSIS — R7303 Prediabetes: Secondary | ICD-10-CM

## 2017-06-02 DIAGNOSIS — K625 Hemorrhage of anus and rectum: Secondary | ICD-10-CM

## 2017-06-02 DIAGNOSIS — K591 Functional diarrhea: Secondary | ICD-10-CM | POA: Diagnosis not present

## 2017-06-02 DIAGNOSIS — K580 Irritable bowel syndrome with diarrhea: Secondary | ICD-10-CM | POA: Diagnosis not present

## 2017-06-02 NOTE — Telephone Encounter (Signed)
Patient's referral for dietician needs to include pre diabetic in order for insurance to cover it.  Please redo referral.  Thank you.

## 2017-06-03 DIAGNOSIS — J019 Acute sinusitis, unspecified: Secondary | ICD-10-CM | POA: Diagnosis not present

## 2017-06-03 DIAGNOSIS — R7303 Prediabetes: Secondary | ICD-10-CM | POA: Insufficient documentation

## 2017-06-03 DIAGNOSIS — I10 Essential (primary) hypertension: Secondary | ICD-10-CM | POA: Diagnosis not present

## 2017-06-03 DIAGNOSIS — R8290 Unspecified abnormal findings in urine: Secondary | ICD-10-CM | POA: Diagnosis not present

## 2017-06-03 NOTE — Telephone Encounter (Signed)
Nutrition order re-submitted

## 2017-06-11 ENCOUNTER — Ambulatory Visit
Admission: RE | Admit: 2017-06-11 | Discharge: 2017-06-11 | Disposition: A | Discharge status: Home / Self Care | Payer: Medicare Other | Source: Ambulatory Visit | Attending: Nurse Practitioner | Admitting: Nurse Practitioner

## 2017-06-11 DIAGNOSIS — Z9049 Acquired absence of other specified parts of digestive tract: Secondary | ICD-10-CM | POA: Insufficient documentation

## 2017-06-11 DIAGNOSIS — R1032 Left lower quadrant pain: Secondary | ICD-10-CM | POA: Diagnosis not present

## 2017-06-11 DIAGNOSIS — R591 Generalized enlarged lymph nodes: Secondary | ICD-10-CM | POA: Diagnosis not present

## 2017-06-11 DIAGNOSIS — K591 Functional diarrhea: Secondary | ICD-10-CM

## 2017-06-11 DIAGNOSIS — K625 Hemorrhage of anus and rectum: Secondary | ICD-10-CM

## 2017-06-11 DIAGNOSIS — I700 Atherosclerosis of aorta: Secondary | ICD-10-CM | POA: Insufficient documentation

## 2017-06-11 DIAGNOSIS — R109 Unspecified abdominal pain: Secondary | ICD-10-CM | POA: Diagnosis not present

## 2017-06-11 DIAGNOSIS — R197 Diarrhea, unspecified: Secondary | ICD-10-CM | POA: Diagnosis not present

## 2017-06-11 MED ORDER — IOPAMIDOL (ISOVUE-300) INJECTION 61%
100.0000 mL | Freq: Once | INTRAVENOUS | Status: AC | PRN
  Administered 2017-06-11: 100 mL via INTRAVENOUS

## 2017-06-13 DIAGNOSIS — Z6841 Body Mass Index (BMI) 40.0 and over, adult: Secondary | ICD-10-CM | POA: Diagnosis not present

## 2017-06-13 DIAGNOSIS — R05 Cough: Secondary | ICD-10-CM | POA: Diagnosis not present

## 2017-06-13 DIAGNOSIS — M79606 Pain in leg, unspecified: Secondary | ICD-10-CM | POA: Diagnosis not present

## 2017-06-13 DIAGNOSIS — I700 Atherosclerosis of aorta: Secondary | ICD-10-CM | POA: Diagnosis not present

## 2017-06-16 ENCOUNTER — Ambulatory Visit (HOSPITAL_COMMUNITY)
Admission: RE | Admit: 2017-06-16 | Discharge: 2017-06-16 | Disposition: A | Discharge status: Home / Self Care | Payer: Medicare Other | Source: Ambulatory Visit | Attending: Surgery | Admitting: Surgery

## 2017-06-16 ENCOUNTER — Other Ambulatory Visit (HOSPITAL_COMMUNITY): Payer: Self-pay | Admitting: Physician Assistant

## 2017-06-16 DIAGNOSIS — I700 Atherosclerosis of aorta: Secondary | ICD-10-CM | POA: Insufficient documentation

## 2017-06-16 DIAGNOSIS — J301 Allergic rhinitis due to pollen: Secondary | ICD-10-CM | POA: Diagnosis not present

## 2017-06-17 ENCOUNTER — Encounter (HOSPITAL_COMMUNITY): Payer: Medicare Other

## 2017-06-19 ENCOUNTER — Encounter: Payer: Medicare Other | Admitting: Registered Nurse

## 2017-06-23 ENCOUNTER — Encounter: Payer: Medicare Other | Attending: Physical Medicine & Rehabilitation | Admitting: Registered Nurse

## 2017-06-23 ENCOUNTER — Encounter: Payer: Self-pay | Admitting: Registered Nurse

## 2017-06-23 VITALS — BP 116/81 | HR 74

## 2017-06-23 DIAGNOSIS — R202 Paresthesia of skin: Secondary | ICD-10-CM | POA: Insufficient documentation

## 2017-06-23 DIAGNOSIS — F419 Anxiety disorder, unspecified: Secondary | ICD-10-CM

## 2017-06-23 DIAGNOSIS — G609 Hereditary and idiopathic neuropathy, unspecified: Secondary | ICD-10-CM

## 2017-06-23 DIAGNOSIS — Z5181 Encounter for therapeutic drug level monitoring: Secondary | ICD-10-CM

## 2017-06-23 DIAGNOSIS — M797 Fibromyalgia: Secondary | ICD-10-CM | POA: Diagnosis not present

## 2017-06-23 DIAGNOSIS — R200 Anesthesia of skin: Secondary | ICD-10-CM | POA: Diagnosis not present

## 2017-06-23 DIAGNOSIS — M47816 Spondylosis without myelopathy or radiculopathy, lumbar region: Secondary | ICD-10-CM | POA: Diagnosis not present

## 2017-06-23 DIAGNOSIS — M5416 Radiculopathy, lumbar region: Secondary | ICD-10-CM | POA: Diagnosis not present

## 2017-06-23 DIAGNOSIS — M542 Cervicalgia: Secondary | ICD-10-CM

## 2017-06-23 DIAGNOSIS — Z79899 Other long term (current) drug therapy: Secondary | ICD-10-CM

## 2017-06-23 DIAGNOSIS — M5412 Radiculopathy, cervical region: Secondary | ICD-10-CM | POA: Diagnosis not present

## 2017-06-23 DIAGNOSIS — Z981 Arthrodesis status: Secondary | ICD-10-CM

## 2017-06-23 DIAGNOSIS — G894 Chronic pain syndrome: Secondary | ICD-10-CM

## 2017-06-23 DIAGNOSIS — M62838 Other muscle spasm: Secondary | ICD-10-CM | POA: Diagnosis not present

## 2017-06-23 MED ORDER — HYDROCODONE-ACETAMINOPHEN 10-325 MG PO TABS: ORAL_TABLET | ORAL | 0 refills | Status: DC

## 2017-06-23 NOTE — Progress Notes (Signed)
Subjective:    Patient ID: Karina Greer, female    DOB: 11-03-1965, 51 y.o.   MRN: [id number]  HPI: [name] is a 51year old female who returns for follow up appointmentfor chronic pain and medication refill. She states her pain is located in her neck radiating into her left shoulder and lower back pain radiating into her left hip and left lower extremity.[name] states she has burning in her bilateral feet.   In the past [name] has been prescribed Gabapentin Side effect weight gain. Lyrica,: hallucination, Amitriptyline: hallucinations, Cymbalta facial swelling. She was prescribed Topamax in the past she can't recall if she had an adverse reaction.Spoke with Dr. Naaman Plummer regarding the above. Will place a call to [name] to see if she would consider low dose Topamax.  Ms. Goble Morphine equivalent is 40 MME. She is also prescribed Xanax 1 mg by Dr. Jordan Hawks. We have discussed the black box warning of using opioids and benzodiazepines. She is being closely monitored and under the care of her psychiatrist Dr. Jordan Hawks. She verbalizes understanding.   Last UDS was performed on 02/04/2017, it was consistent.    Pain Inventory Average Pain 8 Pain Right Now 9 My pain is constant, sharp, stabbing, tingling and aching  In the last 24 hours, has pain interfered with the following? General activity 7 Relation with others 9 Enjoyment of life 9 What TIME of day is your pain at its worst? all Sleep (in general) Poor  Pain is worse with: walking, bending, sitting, inactivity, standing and some activites Pain improves with: rest, heat/ice, therapy/exercise, pacing activities, medication and TENS Relief from Meds: 5  Mobility walk without assistance how many minutes can you walk? 10 ability to climb steps?  yes do you drive?  yes  Function disabled: date disabled 2005 I need assistance with the following:  meal prep, household duties and  shopping  Neuro/Psych numbness tingling spasms dizziness anxiety  Prior Studies Any changes since last visit?  yes  Physicians involved in your care Any changes since last visit?  no   Family History  Problem Relation Age of Onset  . Hypertension Mother   . Diabetes Father   . Fibromyalgia Sister   . Hypertension Other        Cancer, Cerebrovascular disease run on mother side of family   Social History   Social History  . Marital status: Divorced    Spouse name: N/A  . Number of children: 3  . Years of education: HS   Occupational History  . Unemployed Unemployed    Disability   Social History Main Topics  . Smoking status: Never Smoker  . Smokeless tobacco: Never Used  . Alcohol use No  . Drug use: No  . Sexual activity: Not on file   Other Topics Concern  . Not on file   Social History Narrative  . No narrative on file   Past Surgical History:  Procedure Laterality Date  . ACHILLES TENDON SURGERY Right 3/16  . ANTERIOR CERVICAL DECOMP/DISCECTOMY FUSION N/A 03/11/2016   Procedure: C5-6, C6-7 Anterior Cervical Discectomy and Fusion, Allograft, Plate;  Surgeon: Marybelle Killings, MD;  Location: [facility];  Service: Orthopedics;  Laterality: N/A;  . CHOLECYSTECTOMY  2014  . CYSTO WITH HYDRODISTENSION N/A 01/26/2014   Procedure: CYSTOSCOPY/HYDRODISTENSION;  Surgeon: Bernestine Amass, MD;  Location: [facility];  Service: Urology;  Laterality: N/A;  . CYSTO/  URETHRAL DILATION/  HYDRODISTENTION/   INSTILLATION THERAPY  07-16-2010//   12-30-2007//   10-27-2006  . EXERCISE TOLERENCE TEST  10-12-2010   NEGATIVE  ADEQUATE ETT/  NO ISCHEMIA OR EVIDENCE HIGH GRADE OBSTRUCTIVE CAD/  NO FURTHER TEST NEEDED  . [place] ENDOMETRIAL ABLATION  2014  . LAPAROSCOPIC OVARIAN CYST BX  2005   AND  URETEROSCOPIC LASER LITHO  STONE EXTRACTION  . SHOULDER OPEN ROTATOR CUFF REPAIR Right 2004  . TONSILLECTOMY    . TRANSTHORACIC ECHOCARDIOGRAM  06-07-2006    normal study/  ef 60-65%  . TUBAL LIGATION Bilateral 1995   Past Medical History:  Diagnosis Date  . Anxiety   . Anxiety disorder   . Arthritis   . Chronic low back pain   . Depression   . Dyslipidemia   . Fibromyalgia   . GERD (gastroesophageal reflux disease)   . Headache    hx migraines  . History of kidney stones   . History of panic attacks   . History of renal calculi   . IBS (irritable bowel syndrome)   . Interstitial cystitis   . Lupus    tested positive for antibodies for lupus. dr to do further tests  . Menorrhagia   . OSA on CPAP    not used cpap for several weeks  . Pneumonia    hx  . PONV (postoperative nausea and vomiting)   . RLS (restless legs syndrome)   . Seasonal asthma   . SI (sacroiliac) joint dysfunction   . SUI (stress urinary incontinence, female)   . UTI (lower urinary tract infection)    hx  . 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 02/23/2013   BP 116/81   Pulse 74   SpO2 95%   Opioid Risk Score:  7 Fall Risk Score:  `1  Depression screen PHQ 2/9  Depression screen [facility] 2/9 06/23/2017 04/09/2017 02/04/2017 12/12/2015 06/12/2015 04/17/2015 12/07/2014  Decreased Interest 1 3 3 3 3 3 3   Down, Depressed, Hopeless 1 1 3 1 3 2 2   PHQ - 2 Score 2 4 6 4 6 5 5   Altered sleeping - - - 2 - - 3  Tired, decreased energy - - - 3 - - 3  Change in appetite - - - 3 - - 3  Feeling bad or failure about yourself  - - - 3 - - 1  Trouble concentrating - - - 3 - - 3  Moving slowly or fidgety/restless - - - 0 - - 1  Suicidal thoughts - - - 0 - - 0  PHQ-9 Score - - - 18 - - 19  Difficult doing work/chores - - - Very difficult - - -    Review of Systems  Constitutional: Positive for appetite change.  HENT: Negative.   Eyes: Negative.   Respiratory: Positive for apnea and cough.   Cardiovascular: Negative.   Gastrointestinal: Positive for nausea.  Endocrine: Negative.   Genitourinary: Negative.   Musculoskeletal: Positive for gait problem.  Skin: Negative.    Allergic/Immunologic: Negative.   Neurological: Positive for dizziness and numbness.       Tingling  Hematological: Negative.   Psychiatric/Behavioral: Positive for dysphoric mood. The patient is nervous/anxious.   All other systems reviewed and are negative.      Objective:   Physical Exam  Constitutional: She is oriented to person, place, and time. She appears well-developed and well-nourished.  HENT:  Head: Normocephalic and atraumatic.  Neck: Normal range of motion. Neck supple.  Cervical Paraspinal Tenderness: C-5-C-6  Cardiovascular: Normal rate and regular rhythm.   Pulmonary/Chest: Effort normal and breath sounds normal.  Musculoskeletal:  Normal Muscle Bulk and Muscle Testing Reveals: Upper Extremities: Full ROM and Muscle Strength 5/5 Bilateral AC Joint Tenderness Spinal Forward Flexion: 45 Degrees and Extension 20 Degrees Thoracic Paraspinal Tenderness: T-1-T-3 Lumbar Paraspinal Tenderness: L-3-L-5 Lower Extremities: Full ROM and Muscle Strength 5/5 Arises from Table slowly Antalgic Gait    Neurological: She is alert and oriented to person, place, and time.  Skin: Skin is warm and dry.  Psychiatric: She has a normal mood and affect.  Nursing note and vitals reviewed.         Assessment & Plan:  1. Lumbago/ Lumbar Spondylosis: 06/23/17 Refilled: HYDROcodone 10/325mg  one tablet every 6 hours as needed #120.  We will continue the opioid monitoring program, this consists of regular clinic visits, examinations, urine drug screen, pill counts as well as use of [place] Controlled Substance Reporting System. 2. Fibromyalgia. Continue Current exercise Regime. 06/23/2017 3. Anxiety and depression:Psychiatrist Following: Dr. Jordan Hawks  at the [facility]. Continue Counseling at [facility]. On Xanax, Dr. Jordan Hawks prescribing.  06/23/2017 4. Migraines: On Maxalt. Neurology Following. 06/23/2017 5. OSA : Continue exercise regime as tolerated and losing  weight. 06/23/2017 6. Obesity: Following Healthy Diet Regimen. 06/23/2017 7. Status Post Cervical Spinal Fusion: C5C6- C-6- C-7 Anterior Cervical Discectomy Fusion and Allograft Plate: Dr. Lorin Mercy Following. 06/23/2017 8. Cervicalgia/ Cervical Radiculitis: Dr. Lorin Mercy Following 06/23/2017 9. Muscle Spasm: Continue Tizanidine.06/23/2017 10.Hereditary and Idiopathic Peripheral Neuropathy: Will speak with Dr. Naaman Plummer and Consider low dose Topamax: Continue with  Tens Unit. 11. Left Lumbar Radiculitis: Will speak with Dr. Naaman Plummer, consider low dose topamax.   30 minutes of face to face patient care time was spent during this visit. All questions were encouraged and answered.   F/U in 67month

## 2017-06-24 ENCOUNTER — Telehealth: Payer: Self-pay | Admitting: Registered Nurse

## 2017-06-24 NOTE — Telephone Encounter (Signed)
On 06/24/2017 the  [place] was reviewed no conflict was seen on the [place] with multiple prescribers. Karina Greer has a signed narcotic contract with our office. If there were any discrepancies this would have been reported to her physician.

## 2017-06-25 DIAGNOSIS — Z23 Encounter for immunization: Secondary | ICD-10-CM | POA: Diagnosis not present

## 2017-06-25 MED ORDER — TOPIRAMATE 25 MG PO TABS: 25.0000 mg | ORAL_TABLET | Freq: Every day | ORAL | 2 refills | Status: DC

## 2017-06-25 NOTE — Telephone Encounter (Signed)
Return [name] call, Karina Greer prescribe Topamax 25 mg at HS, she was instructed not to take the Topamax with any of her other medications, she verbalizes understanding. Instructed to call office in two weeks to evaluate medication regime.  Karina Greer having increase frequency of neuropathic pain plus with her history of migraine, we Karina Greer initiate Topamax, also due to her allergies.

## 2017-06-25 NOTE — Telephone Encounter (Signed)
Patient missed a call from Danella Sensing yesterday 06/24/17 and is returning her call.

## 2017-06-27 LAB — DRUG TOX MONITOR 1 W/CONF, ORAL FLD
Amphetamines: NEGATIVE ng/mL (ref <10)
Barbiturates: NEGATIVE ng/mL (ref <10)
Benzodiazepines: NEGATIVE ng/mL (ref <0.50)
Buprenorphine: NEGATIVE ng/mL (ref <0.025)
Cocaine: NEGATIVE ng/mL (ref <2.5)
Codeine: NEGATIVE ng/mL (ref <2.5)
Dihydrocodeine: NEGATIVE ng/mL (ref <2.5)
Fentanyl: NEGATIVE ng/mL (ref <0.10)
Heroin Metabolite: NEGATIVE ng/mL (ref <1.0)
Hydrocodone: 20.5 ng/mL — ABNORMAL HIGH (ref <2.5)
Hydromorphone: NEGATIVE ng/mL (ref <2.5)
MARIJUANA: NEGATIVE ng/mL (ref <2.5)
MDMA: NEGATIVE ng/mL (ref <10)
Meperidine: NEGATIVE ng/mL (ref <5.0)
Meprobamate: NEGATIVE ng/mL (ref <2.5)
Methadone: NEGATIVE ng/mL (ref <5.0)
Morphine: NEGATIVE ng/mL (ref <2.5)
Nicotine Metabolite: NEGATIVE ng/mL (ref <5.0)
Norhydrocodone: NEGATIVE ng/mL (ref <2.5)
Noroxycodone: NEGATIVE ng/mL (ref <2.5)
Opiates: POSITIVE ng/mL — AB (ref <2.5)
Oxycodone: NEGATIVE ng/mL (ref <2.5)
Oxymorphone: NEGATIVE ng/mL (ref <2.5)
Phencyclidine: NEGATIVE ng/mL (ref <10)
Propoxyphene: NEGATIVE ng/mL (ref <5.0)
Tapentadol: NEGATIVE ng/mL (ref <5.0)
Tramadol: NEGATIVE ng/mL (ref <5.0)
Zolpidem: NEGATIVE ng/mL (ref <5.0)

## 2017-06-27 LAB — DRUG TOX ALC METAB W/CON, ORAL FLD: Alcohol Metabolite: NEGATIVE ng/mL (ref <25)

## 2017-06-27 MED FILL — HYDROCODON-APAP 10-325: 10-325 | 30 days supply | Qty: 120 | Fill #0

## 2017-07-01 ENCOUNTER — Telehealth: Payer: Self-pay | Admitting: *Deleted

## 2017-07-01 NOTE — Telephone Encounter (Signed)
Oral swab drug screen was consistent for prescribed medications.  ?

## 2017-07-02 ENCOUNTER — Ambulatory Visit (INDEPENDENT_AMBULATORY_CARE_PROVIDER_SITE_OTHER): Payer: Medicare Other | Admitting: Orthopaedic Surgery

## 2017-07-08 DIAGNOSIS — J301 Allergic rhinitis due to pollen: Secondary | ICD-10-CM | POA: Diagnosis not present

## 2017-07-23 ENCOUNTER — Encounter (HOSPITAL_BASED_OUTPATIENT_CLINIC_OR_DEPARTMENT_OTHER): Payer: Medicare Other | Admitting: Registered Nurse

## 2017-07-23 ENCOUNTER — Encounter: Payer: Self-pay | Admitting: Registered Nurse

## 2017-07-23 VITALS — BP 129/83 | HR 79

## 2017-07-23 DIAGNOSIS — G609 Hereditary and idiopathic neuropathy, unspecified: Secondary | ICD-10-CM

## 2017-07-23 DIAGNOSIS — M62838 Other muscle spasm: Secondary | ICD-10-CM

## 2017-07-23 DIAGNOSIS — M47816 Spondylosis without myelopathy or radiculopathy, lumbar region: Secondary | ICD-10-CM

## 2017-07-23 DIAGNOSIS — Z5181 Encounter for therapeutic drug level monitoring: Secondary | ICD-10-CM

## 2017-07-23 DIAGNOSIS — Z79899 Other long term (current) drug therapy: Secondary | ICD-10-CM | POA: Diagnosis not present

## 2017-07-23 DIAGNOSIS — M542 Cervicalgia: Secondary | ICD-10-CM | POA: Diagnosis not present

## 2017-07-23 DIAGNOSIS — Z981 Arthrodesis status: Secondary | ICD-10-CM | POA: Diagnosis not present

## 2017-07-23 DIAGNOSIS — R202 Paresthesia of skin: Secondary | ICD-10-CM | POA: Diagnosis not present

## 2017-07-23 DIAGNOSIS — R200 Anesthesia of skin: Secondary | ICD-10-CM | POA: Diagnosis not present

## 2017-07-23 DIAGNOSIS — M797 Fibromyalgia: Secondary | ICD-10-CM

## 2017-07-23 DIAGNOSIS — M5412 Radiculopathy, cervical region: Secondary | ICD-10-CM | POA: Diagnosis not present

## 2017-07-23 DIAGNOSIS — G894 Chronic pain syndrome: Secondary | ICD-10-CM | POA: Diagnosis not present

## 2017-07-23 DIAGNOSIS — F419 Anxiety disorder, unspecified: Secondary | ICD-10-CM

## 2017-07-23 MED ORDER — HYDROCODONE-ACETAMINOPHEN 10-325 MG PO TABS: ORAL_TABLET | ORAL | 0 refills | Status: DC

## 2017-07-23 NOTE — Progress Notes (Signed)
Subjective:    Patient ID: Karina Greer, female    DOB: August 11, 1966, 51 y.o.   MRN: [id number]  HPI: [name] is a 51year old female who returns for follow up appointmentfor chronic pain and medication refill. She states her pain is located in her lower back, bilateral  hips and lower extremities.  Ms. Lartigue Morphine equivalent is 40 MME. She is also prescribed Xanax 1 mg by Dr. Jordan Hawks ( last prescription picked up on 03/01/2017). We have discussed the black box warning of using opioids and benzodiazepines. She is being closely monitored and under the care of her psychiatrist Dr. Jordan Hawks. She verbalizes understanding.   Last Oral Swab  was performed on 06/23/2017, it was consistent.    Pain Inventory Average Pain 8 Pain Right Now 9 My pain is constant, sharp, stabbing, tingling and aching  In the last 24 hours, has pain interfered with the following? General activity 9 Relation with others 7 Enjoyment of life 5 What TIME of day is your pain at its worst? all Sleep (in general) Fair  Pain is worse with: walking, bending, sitting, inactivity, standing and some activites Pain improves with: rest, heat/ice, therapy/exercise, pacing activities, medication and TENS Relief from Meds: 7  Mobility walk without assistance how many minutes can you walk? 15 ability to climb steps?  yes do you drive?  yes  Function disabled: date disabled 2005 I need assistance with the following:  meal prep, household duties and shopping  Neuro/Psych numbness tingling spasms dizziness anxiety  Prior Studies Any changes since last visit?  no  Physicians involved in your care Any changes since last visit?  no   Family History  Problem Relation Age of Onset  . Hypertension Mother   . Diabetes Father   . Fibromyalgia Sister   . Hypertension Other        Cancer, Cerebrovascular disease run on mother side of family   Social History   Social History  . Marital status:  Divorced    Spouse name: N/A  . Number of children: 3  . Years of education: HS   Occupational History  . Unemployed Unemployed    Disability   Social History Main Topics  . Smoking status: Never Smoker  . Smokeless tobacco: Never Used  . Alcohol use No  . Drug use: No  . Sexual activity: Not Asked   Other Topics Concern  . None   Social History Narrative  . None   Past Surgical History:  Procedure Laterality Date  . ACHILLES TENDON SURGERY Right 3/16  . ANTERIOR CERVICAL DECOMP/DISCECTOMY FUSION N/A 03/11/2016   Procedure: C5-6, C6-7 Anterior Cervical Discectomy and Fusion, Allograft, Plate;  Surgeon: Marybelle Killings, MD;  Location: [place];  Service: Orthopedics;  Laterality: N/A;  . CHOLECYSTECTOMY  2014  . CYSTO WITH HYDRODISTENSION N/A 01/26/2014   Procedure: CYSTOSCOPY/HYDRODISTENSION;  Surgeon: Bernestine Amass, MD;  Location[facility];  Service: Urology;  Laterality: N/A;  . CYSTO/  URETHRAL DILATION/  HYDRODISTENTION/   INSTILLATION THERAPY  07-16-2010//   12-30-2007//   10-27-2006  . EXERCISE TOLERENCE TEST  10-12-2010   NEGATIVE  ADEQUATE ETT/  NO ISCHEMIA OR EVIDENCE HIGH GRADE OBSTRUCTIVE CAD/  NO FURTHER TEST NEEDED  . [place] ENDOMETRIAL ABLATION  2014  . LAPAROSCOPIC OVARIAN CYST BX  2005   AND  URETEROSCOPIC LASER LITHO  STONE EXTRACTION  . SHOULDER OPEN ROTATOR CUFF REPAIR Right 2004  . TONSILLECTOMY    .  TRANSTHORACIC ECHOCARDIOGRAM  06-07-2006   normal study/  ef 60-65%  . TUBAL LIGATION Bilateral 1995   Past Medical History:  Diagnosis Date  . Anxiety   . Anxiety disorder   . Arthritis   . Chronic low back pain   . Depression   . Dyslipidemia   . Fibromyalgia   . GERD (gastroesophageal reflux disease)   . Headache    hx migraines  . History of kidney stones   . History of panic attacks   . History of renal calculi   . IBS (irritable bowel syndrome)   . Interstitial cystitis   . Lupus    tested positive for  antibodies for lupus. dr to do further tests  . Menorrhagia   . OSA on CPAP    not used cpap for several weeks  . Pneumonia    hx  . PONV (postoperative nausea and vomiting)   . RLS (restless legs syndrome)   . Seasonal asthma   . SI (sacroiliac) joint dysfunction   . SUI (stress urinary incontinence, female)   . UTI (lower urinary tract infection)    hx  . 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 02/23/2013   BP 129/83   Pulse 79   SpO2 95%   Opioid Risk Score:  7 Fall Risk Score:  `1  Depression screen PHQ 2/9  Depression screen [facility] 2/9 07/23/2017 06/23/2017 04/09/2017 02/04/2017 12/12/2015 06/12/2015 04/17/2015  Decreased Interest 1 1 3 3 3 3 3   Down, Depressed, Hopeless 1 1 1 3 1 3 2   PHQ - 2 Score 2 2 4 6 4 6 5   Altered sleeping - - - - 2 - -  Tired, decreased energy - - - - 3 - -  Change in appetite - - - - 3 - -  Feeling bad or failure about yourself  - - - - 3 - -  Trouble concentrating - - - - 3 - -  Moving slowly or fidgety/restless - - - - 0 - -  Suicidal thoughts - - - - 0 - -  PHQ-9 Score - - - - 18 - -  Difficult doing work/chores - - - - Very difficult - -    Review of Systems  Constitutional: Positive for appetite change.  HENT: Negative.   Eyes: Negative.   Respiratory: Positive for apnea and cough.   Cardiovascular: Negative.   Gastrointestinal: Positive for nausea.  Endocrine: Negative.   Genitourinary: Negative.   Musculoskeletal: Positive for gait problem.  Skin: Negative.   Allergic/Immunologic: Negative.   Neurological: Positive for dizziness and numbness.       Tingling  Hematological: Negative.   Psychiatric/Behavioral: Positive for dysphoric mood. The patient is nervous/anxious.   All other systems reviewed and are negative.      Objective:   Physical Exam  Constitutional: She is oriented to person, place, and time. She appears well-developed and well-nourished.  HENT:  Head: Normocephalic and atraumatic.  Neck: Normal range of motion.  Neck supple.     Cardiovascular: Normal rate and regular rhythm.   Pulmonary/Chest: Effort normal and breath sounds normal.  Musculoskeletal:  Normal Muscle Bulk and Muscle Testing Reveals: Upper Extremities: Full ROM and Muscle Strength 5/5 Right  AC Joint Tenderness Lumbar Paraspinal Tenderness: L-3-L-5 Lower Extremities: Full ROM and Muscle Strength 5/5 Arises from Table slowly Narrow Based Gait   Neurological: She is alert and oriented to person, place, and time.  Skin: Skin is warm and dry.  Psychiatric: She  has a normal mood and affect.  Nursing note and vitals reviewed.         Assessment & Plan:  1. Lumbago/ Lumbar Spondylosis: 07/23/17 Refilled: HYDROcodone 10/325mg  one tablet every 6 hours as needed #120.  We will continue the opioid monitoring program, this consists of regular clinic visits, examinations, urine drug screen, pill counts as well as use of [place] Controlled Substance Reporting System. 2. Fibromyalgia. Continue Current exercise Regime. 07/23/2017 3. Anxiety and depression:Psychiatrist Following: Dr. Jordan Hawks  at the [facility]. Continue Counseling at [facility]. On Xanax, Dr. Jordan Hawks prescribing.  07/23/2017 4. Migraines: On Maxalt. Neurology Following. 07/23/2017 5. OSA : Continue exercise regime as tolerated and losing weight. 07/23/2017 6. Obesity: Following Healthy Diet Regimen. 07/23/2017 7. Status Post Cervical Spinal Fusion: C5C6- C-6- C-7 Anterior Cervical Discectomy Fusion and Allograft Plate: Dr. Lorin Mercy Following. 07/23/2017 8. Cervicalgia/ Cervical Radiculitis: Continue Topamax Dr. Lorin Mercy Following 07/23/2017 9. Muscle Spasm: Continue Tizanidine.07/23/2017 10.Hereditary and Idiopathic Peripheral Neuropathy:Continue  Topamax: Continue with Tens Unit. 07/23/2017   30 minutes of face to face patient care time was spent during this visit. All questions were encouraged and answered.   F/U in 32month

## 2017-07-24 ENCOUNTER — Ambulatory Visit: Payer: Medicare Other | Admitting: Registered Nurse

## 2017-07-28 MED FILL — HYDROCODON-APAP 10-325: 10-325 | 30 days supply | Qty: 120 | Fill #0

## 2017-07-30 DIAGNOSIS — I700 Atherosclerosis of aorta: Secondary | ICD-10-CM | POA: Diagnosis not present

## 2017-07-30 DIAGNOSIS — Z6841 Body Mass Index (BMI) 40.0 and over, adult: Secondary | ICD-10-CM | POA: Diagnosis not present

## 2017-07-30 DIAGNOSIS — I10 Essential (primary) hypertension: Secondary | ICD-10-CM | POA: Diagnosis not present

## 2017-07-30 DIAGNOSIS — J3089 Other allergic rhinitis: Secondary | ICD-10-CM | POA: Diagnosis not present

## 2017-07-30 DIAGNOSIS — G43009 Migraine without aura, not intractable, without status migrainosus: Secondary | ICD-10-CM | POA: Diagnosis not present

## 2017-07-30 DIAGNOSIS — M810 Age-related osteoporosis without current pathological fracture: Secondary | ICD-10-CM | POA: Diagnosis not present

## 2017-07-30 DIAGNOSIS — E559 Vitamin D deficiency, unspecified: Secondary | ICD-10-CM | POA: Diagnosis not present

## 2017-07-30 DIAGNOSIS — J301 Allergic rhinitis due to pollen: Secondary | ICD-10-CM | POA: Diagnosis not present

## 2017-07-30 DIAGNOSIS — E7800 Pure hypercholesterolemia, unspecified: Secondary | ICD-10-CM | POA: Diagnosis not present

## 2017-07-30 DIAGNOSIS — K219 Gastro-esophageal reflux disease without esophagitis: Secondary | ICD-10-CM | POA: Diagnosis not present

## 2017-07-30 DIAGNOSIS — J3081 Allergic rhinitis due to animal (cat) (dog) hair and dander: Secondary | ICD-10-CM | POA: Diagnosis not present

## 2017-07-31 DIAGNOSIS — G4733 Obstructive sleep apnea (adult) (pediatric): Secondary | ICD-10-CM | POA: Diagnosis not present

## 2017-07-31 DIAGNOSIS — R0602 Shortness of breath: Secondary | ICD-10-CM | POA: Diagnosis not present

## 2017-07-31 DIAGNOSIS — K219 Gastro-esophageal reflux disease without esophagitis: Secondary | ICD-10-CM | POA: Diagnosis not present

## 2017-07-31 DIAGNOSIS — J301 Allergic rhinitis due to pollen: Secondary | ICD-10-CM | POA: Diagnosis not present

## 2017-08-05 DIAGNOSIS — N201 Calculus of ureter: Secondary | ICD-10-CM | POA: Diagnosis not present

## 2017-08-05 DIAGNOSIS — R102 Pelvic and perineal pain: Secondary | ICD-10-CM | POA: Diagnosis not present

## 2017-08-07 DIAGNOSIS — R300 Dysuria: Secondary | ICD-10-CM | POA: Diagnosis not present

## 2017-08-07 DIAGNOSIS — R140 Abdominal distension (gaseous): Secondary | ICD-10-CM | POA: Diagnosis not present

## 2017-08-07 DIAGNOSIS — M47816 Spondylosis without myelopathy or radiculopathy, lumbar region: Secondary | ICD-10-CM | POA: Diagnosis not present

## 2017-08-07 DIAGNOSIS — K580 Irritable bowel syndrome with diarrhea: Secondary | ICD-10-CM | POA: Diagnosis not present

## 2017-08-07 DIAGNOSIS — K625 Hemorrhage of anus and rectum: Secondary | ICD-10-CM | POA: Diagnosis not present

## 2017-08-07 DIAGNOSIS — R1032 Left lower quadrant pain: Secondary | ICD-10-CM | POA: Diagnosis not present

## 2017-08-20 DIAGNOSIS — F411 Generalized anxiety disorder: Secondary | ICD-10-CM | POA: Diagnosis not present

## 2017-08-20 DIAGNOSIS — F331 Major depressive disorder, recurrent, moderate: Secondary | ICD-10-CM | POA: Diagnosis not present

## 2017-08-21 ENCOUNTER — Encounter: Payer: Medicare Other | Attending: Physical Medicine & Rehabilitation | Admitting: Registered Nurse

## 2017-08-21 ENCOUNTER — Encounter: Payer: Self-pay | Admitting: Registered Nurse

## 2017-08-21 VITALS — BP 134/86 | HR 80

## 2017-08-21 DIAGNOSIS — Z5181 Encounter for therapeutic drug level monitoring: Secondary | ICD-10-CM | POA: Diagnosis not present

## 2017-08-21 DIAGNOSIS — M5412 Radiculopathy, cervical region: Secondary | ICD-10-CM | POA: Diagnosis not present

## 2017-08-21 DIAGNOSIS — R5381 Other malaise: Secondary | ICD-10-CM

## 2017-08-21 DIAGNOSIS — G894 Chronic pain syndrome: Secondary | ICD-10-CM | POA: Diagnosis not present

## 2017-08-21 DIAGNOSIS — M47816 Spondylosis without myelopathy or radiculopathy, lumbar region: Secondary | ICD-10-CM

## 2017-08-21 DIAGNOSIS — F419 Anxiety disorder, unspecified: Secondary | ICD-10-CM

## 2017-08-21 DIAGNOSIS — Z79899 Other long term (current) drug therapy: Secondary | ICD-10-CM

## 2017-08-21 DIAGNOSIS — Z981 Arthrodesis status: Secondary | ICD-10-CM

## 2017-08-21 DIAGNOSIS — R202 Paresthesia of skin: Secondary | ICD-10-CM | POA: Diagnosis not present

## 2017-08-21 DIAGNOSIS — R200 Anesthesia of skin: Secondary | ICD-10-CM | POA: Diagnosis not present

## 2017-08-21 DIAGNOSIS — M62838 Other muscle spasm: Secondary | ICD-10-CM | POA: Diagnosis not present

## 2017-08-21 DIAGNOSIS — G43001 Migraine without aura, not intractable, with status migrainosus: Secondary | ICD-10-CM

## 2017-08-21 DIAGNOSIS — M797 Fibromyalgia: Secondary | ICD-10-CM

## 2017-08-21 DIAGNOSIS — G609 Hereditary and idiopathic neuropathy, unspecified: Secondary | ICD-10-CM

## 2017-08-21 DIAGNOSIS — M542 Cervicalgia: Secondary | ICD-10-CM

## 2017-08-21 DIAGNOSIS — F3289 Other specified depressive episodes: Secondary | ICD-10-CM

## 2017-08-21 MED ORDER — HYDROCODONE-ACETAMINOPHEN 10-325 MG PO TABS: ORAL_TABLET | ORAL | 0 refills | Status: DC

## 2017-08-21 NOTE — Progress Notes (Signed)
Subjective:    Patient ID: Karina Greer, female    DOB: [birth year], 52 y.o.   MRN: [id number]  HPI: Karina Greer is a 51year old female who returns for follow up appointmentfor chronic pain and medication refill. She states her pain is located in her neck radiating into her bilateral shoulders,lower back  and right knee. Her current exercise regime is walking.   Karina Greer Morphine equivalent is 40 MME. She is also prescribed Xanax 1 mg by Dr.Senna. She is seeing a new psychiatrist Dr. Olivia Mackie, she can't recall the last name she states.  We have reviewed the black box warning againregarding  using opioids and benzodiazepines. She is being closely monitored and under the care of her psychiatrist Dr. Olivia Mackie. She verbalizes understanding.   Last Oral Swab  was performed on 06/23/2017, it was consistent.    Pain Inventory Average Pain 7 Pain Right Now 7 My pain is constant, sharp, stabbing, tingling and aching  In the last 24 hours, has pain interfered with the following? General activity 6 Relation with others 7 Enjoyment of life 8 What TIME of day is your pain at its worst? all Sleep (in general) Fair  Pain is worse with: walking, bending, sitting, inactivity, standing and some activites Pain improves with: rest, heat/ice, therapy/exercise, pacing activities, medication and TENS Relief from Meds: 6  Mobility walk without assistance how many minutes can you walk? 15 ability to climb steps?  yes do you drive?  yes  Function disabled: date disabled 2005 I need assistance with the following:  meal prep, household duties and shopping  Neuro/Psych numbness tingling spasms dizziness anxiety  Prior Studies Any changes since last visit?  no  Physicians involved in your care Any changes since last visit?  no   Family History  Problem Relation Age of Onset  . Hypertension Mother   . Diabetes Father   . Fibromyalgia Sister   . Hypertension Other        Cancer,  Cerebrovascular disease run on mother side of family   Social History   Socioeconomic History  . Marital status: Divorced    Spouse name: None  . Number of children: 3  . Years of education: HS  . Highest education level: None  Social Needs  . Financial resource strain: None  . Food insecurity - worry: None  . Food insecurity - inability: None  . Transportation needs - medical: None  . Transportation needs - non-medical: None  Occupational History  . Occupation: Merchandiser, retail: UNEMPLOYED    Comment: Disability  Tobacco Use  . Smoking status: Never Smoker  . Smokeless tobacco: Never Used  Substance and Sexual Activity  . Alcohol use: No  . Drug use: No  . Sexual activity: None  Other Topics Concern  . None  Social History Narrative  . None   Past Surgical History:  Procedure Laterality Date  . ACHILLES TENDON SURGERY Right 3/16  . ANTERIOR CERVICAL DECOMP/DISCECTOMY FUSION N/A 03/11/2016   Procedure: C5-6, C6-7 Anterior Cervical Discectomy and Fusion, Allograft, Plate;  Surgeon: Marybelle Killings, MD;  Location: [place];  Service: Orthopedics;  Laterality: N/A;  . CHOLECYSTECTOMY  2014  . CYSTO WITH HYDRODISTENSION N/A 01/26/2014   Procedure: CYSTOSCOPY/HYDRODISTENSION;  Surgeon: Bernestine Amass, MD;  Location: [facility];  Service: Urology;  Laterality: N/A;  . CYSTO/  URETHRAL DILATION/  HYDRODISTENTION/   INSTILLATION THERAPY  07-16-2010//   12-30-2007//   10-27-2006  .  EXERCISE TOLERENCE TEST  10-12-2010   NEGATIVE  ADEQUATE ETT/  NO ISCHEMIA OR EVIDENCE HIGH GRADE OBSTRUCTIVE CAD/  NO FURTHER TEST NEEDED  . [place] ENDOMETRIAL ABLATION  2014  . LAPAROSCOPIC OVARIAN CYST BX  2005   AND  URETEROSCOPIC LASER LITHO  STONE EXTRACTION  . SHOULDER OPEN ROTATOR CUFF REPAIR Right 2004  . TONSILLECTOMY    . TRANSTHORACIC ECHOCARDIOGRAM  06-07-2006   normal study/  ef 60-65%  . TUBAL LIGATION Bilateral 1995   Past Medical History:    Diagnosis Date  . Anxiety   . Anxiety disorder   . Arthritis   . Chronic low back pain   . Depression   . Dyslipidemia   . Fibromyalgia   . GERD (gastroesophageal reflux disease)   . Headache    hx migraines  . History of kidney stones   . History of panic attacks   . History of renal calculi   . IBS (irritable bowel syndrome)   . Interstitial cystitis   . Lupus    tested positive for antibodies for lupus. dr to do further tests  . Menorrhagia   . OSA on CPAP    not used cpap for several weeks  . Pneumonia    hx  . PONV (postoperative nausea and vomiting)   . RLS (restless legs syndrome)   . Seasonal asthma   . SI (sacroiliac) joint dysfunction   . SUI (stress urinary incontinence, female)   . UTI (lower urinary tract infection)    hx  . 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 02/23/2013   BP 134/86   Pulse 80   SpO2 97%   Opioid Risk Score:  7 Fall Risk Score:  `1  Depression screen PHQ 2/9  Depression screen [facility] 2/9 07/23/2017 06/23/2017 04/09/2017 02/04/2017 12/12/2015 06/12/2015 04/17/2015  Decreased Interest 1 1 3 3 3 3 3   Down, Depressed, Hopeless 1 1 1 3 1 3 2   PHQ - 2 Score 2 2 4 6 4 6 5   Altered sleeping - - - - 2 - -  Tired, decreased energy - - - - 3 - -  Change in appetite - - - - 3 - -  Feeling bad or failure about yourself  - - - - 3 - -  Trouble concentrating - - - - 3 - -  Moving slowly or fidgety/restless - - - - 0 - -  Suicidal thoughts - - - - 0 - -  PHQ-9 Score - - - - 18 - -  Difficult doing work/chores - - - - Very difficult - -    Review of Systems  Constitutional: Positive for appetite change and unexpected weight change.  HENT: Negative.   Eyes: Negative.   Respiratory: Positive for apnea.   Cardiovascular: Negative.   Gastrointestinal: Positive for nausea.  Endocrine: Negative.   Genitourinary: Negative.   Musculoskeletal: Positive for gait problem.  Skin: Negative.   Allergic/Immunologic: Negative.   Neurological: Positive for  dizziness and numbness.       Tingling  Hematological: Negative.   Psychiatric/Behavioral: Positive for dysphoric mood. The patient is nervous/anxious.   All other systems reviewed and are negative.      Objective:   Physical Exam  Constitutional: She is oriented to person, place, and time. She appears well-developed and well-nourished.  HENT:  Head: Normocephalic and atraumatic.  Neck: Normal range of motion. Neck supple.     Cardiovascular: Normal rate and regular rhythm.  Pulmonary/Chest:  Effort normal and breath sounds normal.  Musculoskeletal:  Normal Muscle Bulk and Muscle Testing Reveals: Upper Extremities: Full ROM and Muscle Strength 5/5 Right  AC Joint Tenderness Lumbar Paraspinal Tenderness: L-3-L-5 Lower Extremities: Full ROM and Muscle Strength 5/5 Arises from Table slowly Narrow Based Gait   Neurological: She is alert and oriented to person, place, and time.  Skin: Skin is warm and dry.  Psychiatric: She has a normal mood and affect.  Nursing note and vitals reviewed.         Assessment & Plan:  1. Lumbago/ Lumbar Spondylosis: 08/21/17 Refilled: HYDROcodone 10/325mg  one tablet every 6 hours as needed #120.  We will continue the opioid monitoring program, this consists of regular clinic visits, examinations, urine drug screen, pill counts as well as use of [place] Controlled Substance Reporting System. 2. Fibromyalgia. Continue Current exercise Regime. 08/21/2017 3. Anxiety and depression:Psychiatrist Following: Dr. Olivia Mackie  at the [place]. Continue Counseling at [facility]. On Xanax, 08/21/2017 4. Migraines: On Maxalt. Neurology Following. 08/21/2017 5. OSA : Continue exercise regime as tolerated and losing weight. 08/21/2017 6. Obesity: Following Healthy Diet Regimen. 08/21/2017 7. Status Post Cervical Spinal Fusion: C5C6- C-6- C-7 Anterior Cervical Discectomy Fusion and Allograft Plate: Dr. Lorin Mercy Following. 08/21/2017 8. Cervicalgia/  Cervical Radiculitis: Continue Topamax Dr. Lorin Mercy Following 08/21/2017 9. Muscle Spasm: Continue Tizanidine as needed.08/21/2017 10.Hereditary and Idiopathic Peripheral Neuropathy:Continue  Topamax: Continue with Tens Unit. 08/21/2017 11. Physical Deconditioning: RX: Physical Therapy  20 minutes of face to face patient care time was spent during this visit. All questions were encouraged and answered.   F/U in 47month

## 2017-08-25 ENCOUNTER — Encounter
Admission: RE | Disposition: A | Discharge status: Home / Self Care | Payer: Self-pay | Source: Ambulatory Visit | Attending: Unknown Physician Specialty

## 2017-08-25 ENCOUNTER — Ambulatory Visit: Payer: Medicare Other | Admitting: Certified Registered"

## 2017-08-25 ENCOUNTER — Ambulatory Visit
Admission: RE | Admit: 2017-08-25 | Discharge: 2017-08-25 | Disposition: A | Discharge status: Home / Self Care | Payer: Medicare Other | Source: Ambulatory Visit | Attending: Unknown Physician Specialty | Admitting: Unknown Physician Specialty

## 2017-08-25 ENCOUNTER — Encounter: Payer: Self-pay | Admitting: Anesthesiology

## 2017-08-25 DIAGNOSIS — Z6841 Body Mass Index (BMI) 40.0 and over, adult: Secondary | ICD-10-CM | POA: Diagnosis not present

## 2017-08-25 DIAGNOSIS — R1032 Left lower quadrant pain: Secondary | ICD-10-CM | POA: Insufficient documentation

## 2017-08-25 DIAGNOSIS — J45909 Unspecified asthma, uncomplicated: Secondary | ICD-10-CM | POA: Diagnosis not present

## 2017-08-25 DIAGNOSIS — K635 Polyp of colon: Secondary | ICD-10-CM | POA: Insufficient documentation

## 2017-08-25 DIAGNOSIS — F419 Anxiety disorder, unspecified: Secondary | ICD-10-CM | POA: Insufficient documentation

## 2017-08-25 DIAGNOSIS — K219 Gastro-esophageal reflux disease without esophagitis: Secondary | ICD-10-CM | POA: Insufficient documentation

## 2017-08-25 DIAGNOSIS — Z79899 Other long term (current) drug therapy: Secondary | ICD-10-CM | POA: Diagnosis not present

## 2017-08-25 DIAGNOSIS — K648 Other hemorrhoids: Secondary | ICD-10-CM | POA: Diagnosis not present

## 2017-08-25 DIAGNOSIS — K625 Hemorrhage of anus and rectum: Secondary | ICD-10-CM | POA: Diagnosis not present

## 2017-08-25 DIAGNOSIS — G4733 Obstructive sleep apnea (adult) (pediatric): Secondary | ICD-10-CM | POA: Insufficient documentation

## 2017-08-25 HISTORY — PX: COLONOSCOPY WITH PROPOFOL: SHX5780

## 2017-08-25 LAB — POCT PREGNANCY, URINE: Preg Test, Ur: NEGATIVE

## 2017-08-25 SURGERY — COLONOSCOPY WITH PROPOFOL
Anesthesia: General

## 2017-08-25 MED ORDER — SODIUM CHLORIDE 0.9 % IV SOLN: INTRAVENOUS | Status: DC

## 2017-08-25 MED ORDER — MIDAZOLAM HCL 2 MG/2ML IJ SOLN
INTRAMUSCULAR | Status: AC
  Filled 2017-08-25: qty 2

## 2017-08-25 MED ORDER — PROPOFOL 500 MG/50ML IV EMUL
INTRAVENOUS | Status: DC | PRN
  Administered 2017-08-25: 120 ug/kg/min via INTRAVENOUS

## 2017-08-25 MED ORDER — MIDAZOLAM HCL 5 MG/5ML IJ SOLN
INTRAMUSCULAR | Status: DC | PRN
  Administered 2017-08-25 (×2): 1 mg via INTRAVENOUS

## 2017-08-25 MED ORDER — PROPOFOL 10 MG/ML IV BOLUS
INTRAVENOUS | Status: AC
  Filled 2017-08-25: qty 20

## 2017-08-25 MED ORDER — LIDOCAINE 2% (20 MG/ML) 5 ML SYRINGE
INTRAMUSCULAR | Status: DC | PRN
  Administered 2017-08-25: 25 mg via INTRAVENOUS

## 2017-08-25 MED ORDER — SODIUM CHLORIDE 0.9 % IV SOLN
INTRAVENOUS | Status: DC
  Administered 2017-08-25: 1000 mL via INTRAVENOUS

## 2017-08-25 MED ORDER — PROPOFOL 10 MG/ML IV BOLUS
INTRAVENOUS | Status: DC | PRN
  Administered 2017-08-25: 20 mg via INTRAVENOUS
  Administered 2017-08-25: 30 mg via INTRAVENOUS
  Administered 2017-08-25: 70 mg via INTRAVENOUS

## 2017-08-25 NOTE — Anesthesia Post-op Follow-up Note (Signed)
Anesthesia QCDR form completed.        

## 2017-08-25 NOTE — Op Note (Signed)
[facility] Patient Name: Karina Greer Procedure Date: 08/25/2017 2:24 PM MRN: [id number] Account #: [id number] Date of Birth: 08-23-1966 Admit Type: Outpatient Age: 51 Room: [facility] ENDO ROOM 1 Gender: Female Note Status: Finalized Procedure:            Colonoscopy Indications:          Abdominal pain in the left lower quadrant, Rectal                        bleeding Providers:            Manya Silvas, MD Referring MD:         Cyndi Bender (Referring MD) Medicines:            Propofol per Anesthesia Complications:        No immediate complications. Procedure:            Pre-Anesthesia Assessment:                       - After reviewing the risks and benefits, the patient                        was deemed in satisfactory condition to undergo the                        procedure.                       After obtaining informed consent, the colonoscope was                        passed under direct vision. Throughout the procedure,                        the patient's blood pressure, pulse, and oxygen                        saturations were monitored continuously. The Olympus                        PCF-H180AL colonoscope ( S#: [id number] ) was introduced                        through the anus and advanced to the the cecum,                        identified by appendiceal orifice and ileocecal valve.                        The colonoscopy was performed without difficulty. The                        patient tolerated the procedure well. The quality of                        the bowel preparation was excellent. Findings:      Two sessile polyps were found in the sigmoid colon. The polyps were very       diminutive in size. These polyps were removed with a cold snare.       Resection was complete, but the polyp tissue was not  retrieved.      Internal hemorrhoids      The exam was otherwise without abnormality. Impression:           - Two diminutive  polyps in the sigmoid colon, removed                        with a cold snare. Complete resection. Polyp tissue not                        retrieved.                       - The examination was otherwise normal. Recommendation:       - Repeat colonoscopy in 5 years for surveillance. Manya Silvas, MD 08/25/2017 2:49:59 PM This report has been signed electronically. Number of Addenda: 0 Note Initiated On: 08/25/2017 2:24 PM Scope Withdrawal Time: 0 hours 10 minutes 30 seconds  Total Procedure Duration: 0 hours 16 minutes 31 seconds       [facility]

## 2017-08-25 NOTE — H&P (Signed)
Primary Care Physician:  Cyndi Bender, PA-C Primary Gastroenterologist:  Dr. Vira Agar  Pre-Procedure History & Physical: HPI:  Karina Greer is a 51 y.o. female is here for an colonoscopy.   Past Medical History:  Diagnosis Date  . Anxiety   . Anxiety disorder   . Arthritis   . Chronic low back pain   . Depression   . Dyslipidemia   . Fibromyalgia   . GERD (gastroesophageal reflux disease)   . Headache    hx migraines  . History of kidney stones   . History of panic attacks   . History of renal calculi   . IBS (irritable bowel syndrome)   . Interstitial cystitis   . Lupus    tested positive for antibodies for lupus. dr to do further tests  . Menorrhagia   . OSA on CPAP    not used cpap for several weeks  . Pneumonia    hx  . PONV (postoperative nausea and vomiting)   . RLS (restless legs syndrome)   . Seasonal asthma   . SI (sacroiliac) joint dysfunction   . SUI (stress urinary incontinence, female)   . UTI (lower urinary tract infection)    hx  . 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 02/23/2013    Past Surgical History:  Procedure Laterality Date  . ACHILLES TENDON SURGERY Right 3/16  . ANTERIOR CERVICAL DECOMP/DISCECTOMY FUSION N/A 03/11/2016   Procedure: C5-6, C6-7 Anterior Cervical Discectomy and Fusion, Allograft, Plate;  Surgeon: Marybelle Killings, MD;  Location: [place];  Service: Orthopedics;  Laterality: N/A;  . CHOLECYSTECTOMY  2014  . CYSTO WITH HYDRODISTENSION N/A 01/26/2014   Procedure: CYSTOSCOPY/HYDRODISTENSION;  Surgeon: Bernestine Amass, MD;  Location: [facility];  Service: Urology;  Laterality: N/A;  . CYSTO/  URETHRAL DILATION/  HYDRODISTENTION/   INSTILLATION THERAPY  07-16-2010//   12-30-2007//   10-27-2006  . EXERCISE TOLERENCE TEST  10-12-2010   NEGATIVE  ADEQUATE ETT/  NO ISCHEMIA OR EVIDENCE HIGH GRADE OBSTRUCTIVE CAD/  NO FURTHER TEST NEEDED  . [place] ENDOMETRIAL ABLATION  2014  . LAPAROSCOPIC OVARIAN CYST BX   2005   AND  URETEROSCOPIC LASER LITHO  STONE EXTRACTION  . SHOULDER OPEN ROTATOR CUFF REPAIR Right 2004  . TONSILLECTOMY    . TRANSTHORACIC ECHOCARDIOGRAM  06-07-2006   normal study/  ef 60-65%  . TUBAL LIGATION Bilateral 1995    Prior to Admission medications   Medication Sig Start Date End Date Taking? Authorizing Provider  albuterol (PROVENTIL HFA;VENTOLIN HFA) 108 (90 BASE) MCG/ACT inhaler Inhale 2 puffs into the lungs every 6 (six) hours as needed.    Yes [provider]  albuterol (PROVENTIL) (2.5 MG/3ML) 0.083% nebulizer solution INHALE 1 VIAL VIA NEBULIZER EVERY 6 HOURS AS NEEDED 03/18/15  Yes [provider]  ALPRAZolam (XANAX) 1 MG tablet TAKE 1/2 TABLET BY MOUTH EVERY MORNING & TAKE 1 TABLET AT BEDTIME 01/30/17  Yes [provider]  ARIPiprazole (ABILIFY) 2 MG tablet Take 2 mg by mouth daily.   Yes [provider]  b complex vitamins tablet Take 1 tablet by mouth daily.   Yes [provider]  cetirizine (ZYRTEC) 10 MG tablet TAKE 1 TABLET BY MOUTH EVERY DAY FOR ALLERGIES 03/14/15  Yes [provider]  DEXILANT 30 MG capsule Take 1 capsule by mouth 2 (two) times daily. 02/14/16  Yes [provider]  dicyclomine (BENTYL) 10 MG capsule Take 10 mg by mouth 2 (two)  times daily. As needed 08/03/12  Yes [provider]  EPIPEN 2-PAK 0.3 MG/0.3ML SOAJ injection 0.3 mg See admin instructions.  01/12/15  Yes [provider]  fluticasone (FLONASE) 50 MCG/ACT nasal spray Place 1 spray into both nostrils daily as needed for allergies.  02/09/15  Yes [provider]  HYDROcodone-acetaminophen (NORCO) 10-325 MG tablet TAKE 1 TABLET BY MOUTH EVERY 6 HOURS AS NEEDED FOR PAIN 08/21/17  Yes Bayard Hugger, NP  meclizine (ANTIVERT) 25 MG tablet Take 1 tablet by mouth 3 (three) times daily as needed. 12/23/13  Yes [provider]  montelukast (SINGULAIR) 10 MG tablet Take 10 mg by mouth every morning.   Yes  [provider]  ondansetron (ZOFRAN-ODT) 4 MG disintegrating tablet Take 1 tablet by mouth 3 (three) times daily as needed. 02/02/14  Yes [provider]  PRESCRIPTION MEDICATION every 14 (fourteen) days. 2 ALLERGY INJECTIONS EVERY 2 WKS   Yes [provider]  rizatriptan (MAXALT) 10 MG tablet Take 1 tablet (10 mg total) by mouth 2 (two) times daily as needed. May repeat in 2 hours if needed 02/23/13  Yes Kathrynn Ducking, MD  tiZANidine (ZANAFLEX) 2 MG tablet Take 1 tablet (2 mg total) by mouth every 6 (six) hours as needed. 02/23/13  Yes Kathrynn Ducking, MD  topiramate (TOPAMAX) 25 MG tablet Take 1 tablet (25 mg total) by mouth at bedtime. 06/25/17  Yes Bayard Hugger, NP  Vitamin D, Ergocalciferol, (DRISDOL) 50000 units CAPS capsule Take 50,000 Units by mouth every 7 (seven) days. THURSDAYS   Yes [provider]    Allergies as of 08/21/2017 - Review Complete 07/23/2017  Allergen Reaction Noted  . Alendronate sodium Anaphylaxis 10/07/2011  . Milnacipran hcl Other (See Comments) 10/07/2011  . Elavil [amitriptyline hcl] Other (See Comments) 10/07/2011  . Tramadol Palpitations and Other (See Comments) 10/07/2011  . Codeine sulfate Other (See Comments) 04/18/2014  . Cymbalta [duloxetine hcl] Other (See Comments) 10/07/2011  . Duloxetine Other (See Comments) 08/19/2014  . Lamotrigine  04/17/2015  . Morphine Other (See Comments) 04/11/2014  . Nsaids Other (See Comments) 04/18/2014  . Other  03/01/2016  . Skelaxin Other (See Comments) 10/07/2011  . Tessalon [benzonatate] Other (See Comments) 11/19/2013  . Oxycodone Itching and Rash 10/07/2011  . Oxycodone-acetaminophen Palpitations 03/11/2016    Family History  Problem Relation Age of Onset  . Hypertension Mother   . Diabetes Father   . Fibromyalgia Sister   . Hypertension Other        Cancer, Cerebrovascular disease run on mother side of family    Social History   Socioeconomic History  .  Marital status: Divorced    Spouse name: Not on file  . Number of children: 3  . Years of education: HS  . Highest education level: Not on file  Social Needs  . Financial resource strain: Not on file  . Food insecurity - worry: Not on file  . Food insecurity - inability: Not on file  . Transportation needs - medical: Not on file  . Transportation needs - non-medical: Not on file  Occupational History  . Occupation: Merchandiser, retail: UNEMPLOYED    Comment: Disability  Tobacco Use  . Smoking status: Never Smoker  . Smokeless tobacco: Never Used  Substance and Sexual Activity  . Alcohol use: No  . Drug use: No  . Sexual activity: Not on file  Other Topics Concern  . Not on file  Social History Narrative  .  Not on file    Review of Systems: See HPI, otherwise negative ROS  Physical Exam: BP (!) 162/103   Pulse 86   Temp 98 F (36.7 C) (Tympanic)   Resp 18   Ht 5\' 3"  (1.6 m)   Wt 104.3 kg (230 lb)   LMP  (LMP Unknown)   SpO2 99%   BMI 40.74 kg/m  General:   Alert,  pleasant and cooperative in NAD Head:  Normocephalic and atraumatic. Neck:  Supple; no masses or thyromegaly. Lungs:  Clear throughout to auscultation.    Heart:  Regular rate and rhythm. Abdomen:  Soft, nontender and nondistended. Normal bowel sounds, without guarding, and without rebound.   Neurologic:  Alert and  oriented x4;  grossly normal neurologically.  Impression/Plan: CLETIS MUMA is here for an colonoscopy to be performed for Rectal bleeding and LLQ abd pain.  Risks, benefits, limitations, and alternatives regarding  colonoscopy have been reviewed with the patient.  Questions have been answered.  All parties agreeable.   Gaylyn Cheers, MD  08/25/2017, 2:15 PM

## 2017-08-25 NOTE — Anesthesia Preprocedure Evaluation (Signed)
Anesthesia Evaluation  Patient identified by MRN, date of birth, ID band Patient awake    Reviewed: Allergy & Precautions, H&P , NPO status , Patient's Chart, lab work & pertinent test results  History of Anesthesia Complications (+) PONV and history of anesthetic complications  Airway Mallampati: I  TM Distance: >3 FB Neck ROM: full    Dental  (+) Dental Advidsory Given   Pulmonary neg shortness of breath, asthma , sleep apnea and Continuous Positive Airway Pressure Ventilation , neg COPD, neg recent URI,           Cardiovascular negative cardio ROS       Neuro/Psych  Headaches, neg Seizures PSYCHIATRIC DISORDERS  Neuromuscular disease    GI/Hepatic Neg liver ROS, GERD  ,  Endo/Other  neg diabetesMorbid obesity  Renal/GU negative Renal ROS     Musculoskeletal  (+) Arthritis , Fibromyalgia -  Abdominal   Peds  Hematology negative hematology ROS (+)   Anesthesia Other Findings Chronic pain which is psychiatric in nature  Reproductive/Obstetrics negative OB ROS                             Anesthesia Physical  Anesthesia Plan  ASA: III  Anesthesia Plan: General   Post-op Pain Management:    Induction: Intravenous  PONV Risk Score and Plan: 4 or greater and Propofol infusion  Airway Management Planned: Nasal Cannula  Additional Equipment:   Intra-op Plan:   Post-operative Plan:   Informed Consent: I have reviewed the patients History and Physical, chart, labs and discussed the procedure including the risks, benefits and alternatives for the proposed anesthesia with the patient or authorized representative who has indicated his/her understanding and acceptance.   Dental Advisory Given  Plan Discussed with: Anesthesiologist, CRNA and Surgeon  Anesthesia Plan Comments:         Anesthesia Quick Evaluation

## 2017-08-25 NOTE — Transfer of Care (Signed)
Immediate Anesthesia Transfer of Care Note  Patient: Karina Greer  Procedure(s) Performed: COLONOSCOPY WITH PROPOFOL (N/A )  Patient Location: Endoscopy Unit  Anesthesia Type:General  Level of Consciousness: awake  Airway & Oxygen Therapy: Patient Spontanous Breathing  Post-op Assessment: Report given to RN and Post -op Vital signs reviewed and stable  Post vital signs: Reviewed  Last Vitals:  Vitals:   08/25/17 1322 08/25/17 1450  BP: (!) 162/103 121/82  Pulse: 86 (P) 82  Resp: 18 (!) 22  Temp: 36.7 C (!) (P) 36.2 C  SpO2: 99% (P) 93%    Last Pain:  Vitals:   08/25/17 1450  TempSrc: (P) Tympanic  PainSc:       Patients Stated Pain Goal: 0 (04/54/09 8119)  Complications: No apparent anesthesia complications

## 2017-08-26 ENCOUNTER — Encounter: Payer: Self-pay | Admitting: Unknown Physician Specialty

## 2017-08-26 NOTE — Anesthesia Postprocedure Evaluation (Signed)
Anesthesia Post Note  Patient: Karina Greer  Procedure(s) Performed: COLONOSCOPY WITH PROPOFOL (N/A )  Patient location during evaluation: PACU Anesthesia Type: General Level of consciousness: awake Pain management: pain level controlled Vital Signs Assessment: post-procedure vital signs reviewed and stable Respiratory status: spontaneous breathing Cardiovascular status: stable Anesthetic complications: no     Last Vitals:  Vitals:   08/25/17 1450 08/25/17 1500  BP: 121/82 124/81  Pulse: 82   Resp: (!) 22   Temp: (!) 36.2 C   SpO2: 93%     Last Pain:  Vitals:   08/25/17 1450  TempSrc: Tympanic  PainSc: 0-No pain                 VAN STAVEREN,Kayia Billinger

## 2017-08-28 MED FILL — HYDROCODON-APAP 10-325: 10-325 | 30 days supply | Qty: 120 | Fill #0

## 2017-09-09 ENCOUNTER — Ambulatory Visit (INDEPENDENT_AMBULATORY_CARE_PROVIDER_SITE_OTHER): Payer: Medicare Other

## 2017-09-09 DIAGNOSIS — J301 Allergic rhinitis due to pollen: Secondary | ICD-10-CM | POA: Diagnosis not present

## 2017-09-10 DIAGNOSIS — H6983 Other specified disorders of Eustachian tube, bilateral: Secondary | ICD-10-CM | POA: Diagnosis not present

## 2017-09-10 DIAGNOSIS — J069 Acute upper respiratory infection, unspecified: Secondary | ICD-10-CM | POA: Diagnosis not present

## 2017-09-18 DIAGNOSIS — F331 Major depressive disorder, recurrent, moderate: Secondary | ICD-10-CM | POA: Diagnosis not present

## 2017-09-18 DIAGNOSIS — F411 Generalized anxiety disorder: Secondary | ICD-10-CM | POA: Diagnosis not present

## 2017-09-19 ENCOUNTER — Other Ambulatory Visit: Payer: Self-pay

## 2017-09-19 ENCOUNTER — Encounter: Payer: Self-pay | Admitting: Registered Nurse

## 2017-09-19 ENCOUNTER — Encounter: Payer: Medicare Other | Attending: Physical Medicine & Rehabilitation | Admitting: Registered Nurse

## 2017-09-19 VITALS — BP 132/86 | HR 65

## 2017-09-19 DIAGNOSIS — M542 Cervicalgia: Secondary | ICD-10-CM

## 2017-09-19 DIAGNOSIS — G894 Chronic pain syndrome: Secondary | ICD-10-CM

## 2017-09-19 DIAGNOSIS — M47816 Spondylosis without myelopathy or radiculopathy, lumbar region: Secondary | ICD-10-CM

## 2017-09-19 DIAGNOSIS — G609 Hereditary and idiopathic neuropathy, unspecified: Secondary | ICD-10-CM

## 2017-09-19 DIAGNOSIS — F419 Anxiety disorder, unspecified: Secondary | ICD-10-CM

## 2017-09-19 DIAGNOSIS — Z981 Arthrodesis status: Secondary | ICD-10-CM | POA: Diagnosis not present

## 2017-09-19 DIAGNOSIS — M62838 Other muscle spasm: Secondary | ICD-10-CM

## 2017-09-19 DIAGNOSIS — R202 Paresthesia of skin: Secondary | ICD-10-CM | POA: Diagnosis not present

## 2017-09-19 DIAGNOSIS — M797 Fibromyalgia: Secondary | ICD-10-CM | POA: Diagnosis not present

## 2017-09-19 DIAGNOSIS — R200 Anesthesia of skin: Secondary | ICD-10-CM | POA: Diagnosis not present

## 2017-09-19 DIAGNOSIS — M5412 Radiculopathy, cervical region: Secondary | ICD-10-CM

## 2017-09-19 DIAGNOSIS — Z79899 Other long term (current) drug therapy: Secondary | ICD-10-CM | POA: Diagnosis not present

## 2017-09-19 DIAGNOSIS — Z5181 Encounter for therapeutic drug level monitoring: Secondary | ICD-10-CM

## 2017-09-19 MED ORDER — HYDROCODONE-ACETAMINOPHEN 10-325 MG PO TABS: ORAL_TABLET | ORAL | 0 refills | Status: DC

## 2017-09-19 NOTE — Progress Notes (Signed)
Subjective:    Patient ID: Karina Greer, female    DOB: 01/09/1966, 51 y.o.   MRN: [id number]  HPI: [name] is a 51year old female who returns for follow up appointmentfor chronic pain and medication refill. She states her pain is located in her neck radiating into her left  Shoulder and lower back pain.She rates her pain 8. Her current exercise regime is walking, performing stretching exercises and treadmill 10-15 minutes daily. Also reports she has a Physical Therapy Consultation on 10/10/2017.  Karina Greer Morphine equivalent is 40.00 MME. She is also prescribed Xanax 1 mg by Karina Greer. She is seeing a new psychiatrist Karina Greer at the [facility]. We have reviewed the black box warning again regarding  using opioids and benzodiazepines. She is being closely monitored and under the care of her psychiatrist Karina Greer. She verbalizes understanding.   Last Oral Swab  was performed on 06/23/2017, it was consistent.    Pain Inventory Average Pain 8 Pain Right Now 8 My pain is constant, sharp, stabbing, tingling and aching  In the last 24 hours, has pain interfered with the following? General activity 9 Relation with others 9 Enjoyment of life 9 What TIME of day is your pain at its worst? all Sleep (in general) Poor  Pain is worse with: walking, bending, sitting, inactivity, standing and some activites Pain improves with: rest, heat/ice, therapy/exercise, pacing activities, medication and TENS Relief from Meds: 5  Mobility walk without assistance how many minutes can you walk? 15 ability to climb steps?  yes do you drive?  yes  Function disabled: date disabled 2005 I need assistance with the following:  meal prep, household duties and shopping  Neuro/Psych numbness tingling spasms dizziness anxiety  Prior Studies Any changes since last visit?  no  Physicians involved in your care Any changes since last visit?  no   Family History  Problem Relation  Age of Onset  . Hypertension Mother   . Diabetes Father   . Fibromyalgia Sister   . Hypertension Other        Cancer, Cerebrovascular disease run on mother side of family   Social History   Socioeconomic History  . Marital status: Divorced    Spouse name: None  . Number of children: 3  . Years of education: HS  . Highest education level: None  Social Needs  . Financial resource strain: None  . Food insecurity - worry: None  . Food insecurity - inability: None  . Transportation needs - medical: None  . Transportation needs - non-medical: None  Occupational History  . Occupation: Merchandiser, retail: UNEMPLOYED    Comment: Disability  Tobacco Use  . Smoking status: Never Smoker  . Smokeless tobacco: Never Used  Substance and Sexual Activity  . Alcohol use: No  . Drug use: No  . Sexual activity: None  Other Topics Concern  . None  Social History Narrative  . None   Past Surgical History:  Procedure Laterality Date  . ACHILLES TENDON SURGERY Right 3/16  . ANTERIOR CERVICAL DECOMP/DISCECTOMY FUSION N/A 03/11/2016   Procedure: C5-6, C6-7 Anterior Cervical Discectomy and Fusion, Allograft, Plate;  Surgeon: Marybelle Killings, MD;  Location: [facility];  Service: Orthopedics;  Laterality: N/A;  . CHOLECYSTECTOMY  2014  . COLONOSCOPY WITH PROPOFOL N/A 08/25/2017   Procedure: COLONOSCOPY WITH PROPOFOL;  Surgeon: Manya Silvas, MD;  Location: [facility] ENDOSCOPY;  Service: Endoscopy;  Laterality: N/A;  . CYSTO WITH HYDRODISTENSION  N/A 01/26/2014   Procedure: CYSTOSCOPY/HYDRODISTENSION;  Surgeon: Bernestine Amass, MD;  Location: [facility];  Service: Urology;  Laterality: N/A;  . CYSTO/  URETHRAL DILATION/  HYDRODISTENTION/   INSTILLATION THERAPY  07-16-2010//   12-30-2007//   10-27-2006  . EXERCISE TOLERENCE TEST  10-12-2010   NEGATIVE  ADEQUATE ETT/  NO ISCHEMIA OR EVIDENCE HIGH GRADE OBSTRUCTIVE CAD/  NO FURTHER TEST NEEDED  . [place] ENDOMETRIAL ABLATION   2014  . LAPAROSCOPIC OVARIAN CYST BX  2005   AND  URETEROSCOPIC LASER LITHO  STONE EXTRACTION  . SHOULDER OPEN ROTATOR CUFF REPAIR Right 2004  . TONSILLECTOMY    . TRANSTHORACIC ECHOCARDIOGRAM  06-07-2006   normal study/  ef 60-65%  . TUBAL LIGATION Bilateral 1995   Past Medical History:  Diagnosis Date  . Anxiety   . Anxiety disorder   . Arthritis   . Chronic low back pain   . Depression   . Dyslipidemia   . Fibromyalgia   . GERD (gastroesophageal reflux disease)   . Headache    hx migraines  . History of kidney stones   . History of panic attacks   . History of renal calculi   . IBS (irritable bowel syndrome)   . Interstitial cystitis   . Lupus    tested positive for antibodies for lupus. dr to do further tests  . Menorrhagia   . OSA on CPAP    not used cpap for several weeks  . Pneumonia    hx  . PONV (postoperative nausea and vomiting)   . RLS (restless legs syndrome)   . Seasonal asthma   . SI (sacroiliac) joint dysfunction   . SUI (stress urinary incontinence, female)   . UTI (lower urinary tract infection)    hx  . 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 02/23/2013   BP 132/86   Pulse 65   LMP  (LMP Unknown)   SpO2 96%   Opioid Risk Score:  7 Fall Risk Score:  `1  Depression screen PHQ 2/9  Depression screen [facility] 2/9 09/19/2017 07/23/2017 06/23/2017 04/09/2017 02/04/2017 12/12/2015 06/12/2015  Decreased Interest 1 1 1 3 3 3 3   Down, Depressed, Hopeless 1 1 1 1 3 1 3   PHQ - 2 Score 2 2 2 4 6 4 6   Altered sleeping - - - - - 2 -  Tired, decreased energy - - - - - 3 -  Change in appetite - - - - - 3 -  Feeling bad or failure about yourself  - - - - - 3 -  Trouble concentrating - - - - - 3 -  Moving slowly or fidgety/restless - - - - - 0 -  Suicidal thoughts - - - - - 0 -  PHQ-9 Score - - - - - 18 -  Difficult doing work/chores - - - - - Very difficult -    Review of Systems  Constitutional: Negative.   HENT: Negative.   Eyes: Negative.   Respiratory:  Negative.   Cardiovascular: Negative.   Endocrine: Negative.   Genitourinary: Negative.   Musculoskeletal: Positive for gait problem.  Skin: Negative.   Allergic/Immunologic: Negative.   Neurological: Positive for numbness.       Tingling  Hematological: Negative.   Psychiatric/Behavioral: The patient is nervous/anxious.   All other systems reviewed and are negative.      Objective:   Physical Exam  Constitutional: She is oriented to person, place, and time.  She appears well-developed and well-nourished.  HENT:  Head: Normocephalic and atraumatic.  Neck: Normal range of motion. Neck supple.  Cervical Paraspinal Tenderness: C-5-C-6 Mainly Left Side  Cardiovascular: Normal rate and regular rhythm.  Pulmonary/Chest: Effort normal and breath sounds normal.  Musculoskeletal:  Normal Muscle Bulk and Muscle Testing Reveals: Upper Extremities: Full ROM and Muscle Strength 5/5 Left  AC Joint Tenderness Thoracic Paraspinal Tenderness: T-1-T-3 Lumbar Paraspinal Tenderness: L-3-L-5 Lower Extremities: Full ROM and Muscle Strength 5/5 Arises from Table slowly Narrow Based Gait   Neurological: She is alert and oriented to person, place, and time.  Skin: Skin is warm and dry.  Psychiatric: She has a normal mood and affect.  Nursing note and vitals reviewed.         Assessment & Plan:  1. Lumbago/ Lumbar Spondylosis: 09/19/17 Refilled: HYDROcodone 10/325mg  one tablet every 6 hours as needed #120.  We will continue the opioid monitoring program, this consists of regular clinic visits, examinations, urine drug screen, pill counts as well as use of [place] Controlled Substance Reporting System. 2. Fibromyalgia. Continue Current exercise Regime. 09/19/2017 3. Anxiety and depression:Psychiatry Following: Karina Greer  at the [place]. Continue Counseling at [facility]. On Xanax, 09/19/2017 4. Migraines: On Maxalt. Neurology Following. 09/19/2017 5. OSA : Continue  exercise regime as tolerated and losing weight. 09/19/2017 6. Obesity: Following Healthy Diet Regimen. 09/19/2017 7. Status Post Cervical Spinal Fusion: C5C6- C-6- C-7 Anterior Cervical Discectomy Fusion and Allograft Plate: Dr. Lorin Mercy Following. 09/19/2017 8. Cervicalgia/ Cervical Radiculitis: Continue Topamax Dr. Lorin Mercy Following 09/19/2017 9. Muscle Spasm: Continue Tizanidine as needed.09/19/2017 10.Hereditary and Idiopathic Peripheral Neuropathy:Continue  Topamax: Continue with Tens Unit. 09/19/2017 11. Physical Deconditioning: Has a Physical Therapy  Consultation on 10/10/2017. 09/19/2017.  20 minutes of face to face patient care time was spent during this visit. All questions were encouraged and answered.   F/U in 56month

## 2017-09-29 MED FILL — HYDROCODON-APAP 10-325: 10-325 | 30 days supply | Qty: 120 | Fill #0

## 2017-09-30 DIAGNOSIS — H9203 Otalgia, bilateral: Secondary | ICD-10-CM | POA: Diagnosis not present

## 2017-09-30 DIAGNOSIS — Z6841 Body Mass Index (BMI) 40.0 and over, adult: Secondary | ICD-10-CM | POA: Diagnosis not present

## 2017-09-30 DIAGNOSIS — J019 Acute sinusitis, unspecified: Secondary | ICD-10-CM | POA: Diagnosis not present

## 2017-09-30 DIAGNOSIS — R42 Dizziness and giddiness: Secondary | ICD-10-CM | POA: Diagnosis not present

## 2017-10-03 DIAGNOSIS — F331 Major depressive disorder, recurrent, moderate: Secondary | ICD-10-CM | POA: Diagnosis not present

## 2017-10-03 DIAGNOSIS — F411 Generalized anxiety disorder: Secondary | ICD-10-CM | POA: Diagnosis not present

## 2017-10-06 ENCOUNTER — Ambulatory Visit: Payer: Self-pay

## 2017-10-08 ENCOUNTER — Encounter: Payer: Self-pay | Admitting: Internal Medicine

## 2017-10-08 ENCOUNTER — Ambulatory Visit: Payer: Medicare Other

## 2017-10-08 ENCOUNTER — Ambulatory Visit (INDEPENDENT_AMBULATORY_CARE_PROVIDER_SITE_OTHER): Payer: Medicare Other | Admitting: Internal Medicine

## 2017-10-08 VITALS — BP 120/80 | HR 88 | Temp 97.6°F | Resp 16 | Ht 63.0 in | Wt 237.0 lb

## 2017-10-08 DIAGNOSIS — J302 Other seasonal allergic rhinitis: Secondary | ICD-10-CM | POA: Diagnosis not present

## 2017-10-08 DIAGNOSIS — H60543 Acute eczematoid otitis externa, bilateral: Secondary | ICD-10-CM

## 2017-10-08 MED ORDER — AZELASTINE HCL 137 MCG/SPRAY NA SOLN: 2.0000 | Freq: Two times a day (BID) | NASAL | 3 refills | Status: DC

## 2017-10-08 MED ORDER — PREDNISONE 10 MG PO TABS: ORAL_TABLET | ORAL | 0 refills | Status: DC

## 2017-10-08 NOTE — Progress Notes (Signed)
[facility] [place], [place] [address]  Internal MEDICINE  Office Visit Note  Patient Name: Karina Greer  [id number]  [id number]  Date of Service: 10/08/2017  Chief Complaint  Patient presents with  . Otalgia    both.Pt went to the PCP and they gave her ceftin she finished antibiotic  . Cough  . Sinusitis     HPI Pt is here for a sick visit. C/o vertigo. Itching is worse in the ears    Current Medication:  Outpatient Encounter Medications as of 10/08/2017  Medication Sig Note  . albuterol (PROVENTIL HFA;VENTOLIN HFA) 108 (90 BASE) MCG/ACT inhaler Inhale 2 puffs into the lungs every 6 (six) hours as needed.    [name] albuterol (PROVENTIL) (2.5 MG/3ML) 0.083% nebulizer solution INHALE 1 VIAL VIA NEBULIZER EVERY 6 HOURS AS NEEDED   . ALPRAZolam (XANAX) 1 MG tablet TAKE 1/2 TABLET BY MOUTH EVERY MORNING & TAKE 1 TABLET AT BEDTIME 02/04/2017: Last dose was 02/03/17 pm  . b complex vitamins tablet Take 1 tablet by mouth daily.   . cetirizine (ZYRTEC) 10 MG tablet TAKE 1 TABLET BY MOUTH EVERY DAY FOR ALLERGIES   . DEXILANT 30 MG capsule Take 1 capsule by mouth 2 (two) times daily.   [name] dicyclomine (BENTYL) 10 MG capsule Take 10 mg by mouth 2 (two) times daily. As needed   . EPIPEN 2-PAK 0.3 MG/0.3ML SOAJ injection 0.3 mg See admin instructions.  03/11/2016: Never used medication  . fluticasone (FLONASE) 50 MCG/ACT nasal spray Place 1 spray into both nostrils daily as needed for allergies.    [name] HYDROcodone-acetaminophen (NORCO) 10-325 MG tablet TAKE 1 TABLET BY MOUTH EVERY 6 HOURS AS NEEDED FOR PAIN   . meclizine (ANTIVERT) 25 MG tablet Take 1 tablet by mouth 3 (three) times daily as needed.   . montelukast (SINGULAIR) 10 MG tablet Take 10 mg by mouth every morning.   . ondansetron (ZOFRAN-ODT) 4 MG disintegrating tablet Take 1 tablet by mouth 3 (three) times daily as needed.   . rizatriptan (MAXALT) 10 MG tablet Take 1 tablet (10 mg total) by mouth 2 (two) times  daily as needed. May repeat in 2 hours if needed   . tiZANidine (ZANAFLEX) 2 MG tablet Take 1 tablet (2 mg total) by mouth every 6 (six) hours as needed.   . topiramate (TOPAMAX) 25 MG tablet Take 1 tablet (25 mg total) by mouth at bedtime.   . Vitamin D, Ergocalciferol, (DRISDOL) 50000 units CAPS capsule Take 50,000 Units by mouth every 7 (seven) days. THURSDAYS   . ARIPiprazole (ABILIFY) 2 MG tablet Take 2 mg by mouth daily.   [name] PRESCRIPTION MEDICATION every 14 (fourteen) days. 2 ALLERGY INJECTIONS EVERY 2 WKS    No facility-administered encounter medications on file as of 10/08/2017.       Medical History: Past Medical History:  Diagnosis Date  . Anxiety   . Anxiety disorder   . Arthritis   . Chronic low back pain   . Depression   . Dyslipidemia   . Fibromyalgia   . GERD (gastroesophageal reflux disease)   . Headache    hx migraines  . History of kidney stones   . History of panic attacks   . History of renal calculi   . IBS (irritable bowel syndrome)   . Interstitial cystitis   . Lupus    tested positive for antibodies for lupus. dr to do further tests  . Menorrhagia   . OSA on CPAP  not used cpap for several weeks  . Pneumonia    hx  . PONV (postoperative nausea and vomiting)   . RLS (restless legs syndrome)   . Seasonal asthma   . SI (sacroiliac) joint dysfunction   . SUI (stress urinary incontinence, female)   . UTI (lower urinary tract infection)    hx  . 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 02/23/2013     Vital Signs: BP 120/80   Pulse 88   Temp 97.6 F (36.4 C)   Resp 16   Ht 5\' 3"  (1.6 m)   Wt 237 lb (107.5 kg)   SpO2 98%   BMI 41.98 kg/m    Review of Systems  Constitutional: Negative for chills, diaphoresis and fatigue.  HENT: Positive for ear pain (Itching). Negative for postnasal drip and sinus pressure.   Eyes: Negative for photophobia, discharge, redness, itching and visual disturbance.  Respiratory: Negative for cough, shortness of  breath and wheezing.   Cardiovascular: Positive for palpitations. Negative for chest pain.  Gastrointestinal: Positive for abdominal pain. Negative for constipation, diarrhea, nausea and vomiting.  Musculoskeletal: Negative for arthralgias, back pain, gait problem and neck pain.  Skin: Negative for color change.  Allergic/Immunologic: Positive for environmental allergies and food allergies.  Neurological: Negative for headaches.  Hematological: Does not bruise/bleed easily.  Psychiatric/Behavioral: Negative for hallucinations. Behavioral problem: depression.    Physical Exam  Constitutional: She appears well-developed and well-nourished. No distress.  HENT:  Head: Normocephalic and atraumatic.  Right Ear: No drainage. Tympanic membrane is erythematous.  Left Ear: No drainage. Tympanic membrane is erythematous.  Mouth/Throat: Oropharynx is clear and moist. No oropharyngeal exudate.  Eyes: EOM are normal. Pupils are equal, round, and reactive to light.  Neck: Normal range of motion. Neck supple.  Cardiovascular: Normal rate and regular rhythm. Exam reveals friction rub.  No murmur heard. Pulmonary/Chest: Effort normal. She has no wheezes. She has no rales.  Abdominal: Soft. Bowel sounds are normal.  Musculoskeletal: Normal range of motion.  Skin: Skin is warm and dry. She is not diaphoretic.  Psychiatric: She has a normal mood and affect.    Assessment/Plan: 1. Acute seasonal allergic rhinitis - Add Azelastin and dc Flonase and Zyrtec   2. Acute eczematoid otitis externa of both ears - Short Steroid taper and Corticosporin otic  General Counseling: Avnoor verbalizes understanding of the findings of todays visit and agrees with plan of treatment. I have discussed any further diagnostic evaluation that may be needed or ordered today. We also reviewed her medications today. she has been encouraged to call the office with any questions or concerns that should arise related to todays  visit.     Time spent: 25 Minutes

## 2017-10-13 DIAGNOSIS — F411 Generalized anxiety disorder: Secondary | ICD-10-CM | POA: Diagnosis not present

## 2017-10-13 DIAGNOSIS — F331 Major depressive disorder, recurrent, moderate: Secondary | ICD-10-CM | POA: Diagnosis not present

## 2017-10-16 ENCOUNTER — Ambulatory Visit (INDEPENDENT_AMBULATORY_CARE_PROVIDER_SITE_OTHER): Payer: Medicare Other

## 2017-10-16 DIAGNOSIS — J309 Allergic rhinitis, unspecified: Secondary | ICD-10-CM | POA: Diagnosis not present

## 2017-10-20 ENCOUNTER — Other Ambulatory Visit: Payer: Self-pay

## 2017-10-20 ENCOUNTER — Encounter: Payer: Medicare Other | Attending: Physical Medicine & Rehabilitation | Admitting: Registered Nurse

## 2017-10-20 ENCOUNTER — Encounter: Payer: Self-pay | Admitting: Registered Nurse

## 2017-10-20 VITALS — BP 137/83 | HR 69

## 2017-10-20 DIAGNOSIS — M47816 Spondylosis without myelopathy or radiculopathy, lumbar region: Secondary | ICD-10-CM | POA: Diagnosis not present

## 2017-10-20 DIAGNOSIS — Z981 Arthrodesis status: Secondary | ICD-10-CM

## 2017-10-20 DIAGNOSIS — Z79899 Other long term (current) drug therapy: Secondary | ICD-10-CM | POA: Diagnosis not present

## 2017-10-20 DIAGNOSIS — F419 Anxiety disorder, unspecified: Secondary | ICD-10-CM

## 2017-10-20 DIAGNOSIS — Z5181 Encounter for therapeutic drug level monitoring: Secondary | ICD-10-CM

## 2017-10-20 DIAGNOSIS — M797 Fibromyalgia: Secondary | ICD-10-CM | POA: Diagnosis not present

## 2017-10-20 DIAGNOSIS — M62838 Other muscle spasm: Secondary | ICD-10-CM

## 2017-10-20 DIAGNOSIS — M542 Cervicalgia: Secondary | ICD-10-CM

## 2017-10-20 DIAGNOSIS — R200 Anesthesia of skin: Secondary | ICD-10-CM | POA: Diagnosis not present

## 2017-10-20 DIAGNOSIS — G894 Chronic pain syndrome: Secondary | ICD-10-CM

## 2017-10-20 DIAGNOSIS — R202 Paresthesia of skin: Secondary | ICD-10-CM | POA: Diagnosis not present

## 2017-10-20 DIAGNOSIS — F331 Major depressive disorder, recurrent, moderate: Secondary | ICD-10-CM | POA: Diagnosis not present

## 2017-10-20 DIAGNOSIS — F411 Generalized anxiety disorder: Secondary | ICD-10-CM | POA: Diagnosis not present

## 2017-10-20 DIAGNOSIS — M5412 Radiculopathy, cervical region: Secondary | ICD-10-CM

## 2017-10-20 MED ORDER — HYDROCODONE-ACETAMINOPHEN 10-325 MG PO TABS: ORAL_TABLET | ORAL | 0 refills | Status: DC

## 2017-10-20 NOTE — Progress Notes (Signed)
Subjective:    Patient ID: Karina Greer, female    DOB: 05-22-1966, 52 y.o.   MRN: [id number]  HPI: [name] is a 52year old female who returns for follow up appointmentfor chronic pain and medication refill. She states her pain is located in her neck radiating into her right shoulder, also reports mid-lower back pain and bilateral hip pain. She rates her pain 6. Her current exercise regime is walking.   Ms. Brazzel Morphine equivalent is 38.67 MME. She is also prescribed Alprazolam 1 mg by Natasha Bence NP/Dr.Senna. Also reports she is using her Alprazolam sporadically last refill was on 10/04/17 prior prescriptions 08/20/2017. She is seeing a new psychiatrist Dr. Olivia Mackie at the [facility]. We have reviewed the black box warning again regarding  using opioids and benzodiazepines. She is being closely monitored and under the care of her psychiatrist Dr. Olivia Mackie. She verbalizes understanding.   Last Oral Swab  was performed on 06/23/2017, it was consistent. Oral Swab Performed Today.    Pain Inventory Average Pain 8 Pain Right Now 6 My pain is constant, sharp, stabbing, tingling and aching  In the last 24 hours, has pain interfered with the following? General activity 6 Relation with others 6 Enjoyment of life 8 What TIME of day is your pain at its worst? all Sleep (in general) Poor  Pain is worse with: walking, bending, sitting, inactivity, standing and some activites Pain improves with: rest, heat/ice, therapy/exercise, pacing activities, medication and TENS Relief from Meds: 6  Mobility walk without assistance how many minutes can you walk? 15 ability to climb steps?  yes do you drive?  yes  Function disabled: date disabled 2005 I need assistance with the following:  meal prep, household duties and shopping  Neuro/Psych tingling spasms dizziness anxiety  Prior Studies Any changes since last visit?  no  Physicians involved in your care Any changes since  last visit?  no   Family History  Problem Relation Age of Onset  . Hypertension Mother   . Diabetes Father   . Fibromyalgia Sister   . Hypertension Other        Cancer, Cerebrovascular disease run on mother side of family   Social History   Socioeconomic History  . Marital status: Divorced    Spouse name: None  . Number of children: 3  . Years of education: HS  . Highest education level: None  Social Needs  . Financial resource strain: None  . Food insecurity - worry: None  . Food insecurity - inability: None  . Transportation needs - medical: None  . Transportation needs - non-medical: None  Occupational History  . Occupation: Merchandiser, retail: UNEMPLOYED    Comment: Disability  Tobacco Use  . Smoking status: Never Smoker  . Smokeless tobacco: Never Used  Substance and Sexual Activity  . Alcohol use: No  . Drug use: No  . Sexual activity: None  Other Topics Concern  . None  Social History Narrative  . None   Past Surgical History:  Procedure Laterality Date  . ACHILLES TENDON SURGERY Right 3/16  . ANTERIOR CERVICAL DECOMP/DISCECTOMY FUSION N/A 03/11/2016   Procedure: C5-6, C6-7 Anterior Cervical Discectomy and Fusion, Allograft, Plate;  Surgeon: Marybelle Killings, MD;  Location: [facility];  Service: Orthopedics;  Laterality: N/A;  . CHOLECYSTECTOMY  2014  . COLONOSCOPY WITH PROPOFOL N/A 08/25/2017   Procedure: COLONOSCOPY WITH PROPOFOL;  Surgeon: Manya Silvas, MD;  Location: [facility] ENDOSCOPY;  Service:  Endoscopy;  Laterality: N/A;  . CYSTO WITH HYDRODISTENSION N/A 01/26/2014   Procedure: CYSTOSCOPY/HYDRODISTENSION;  Surgeon: Bernestine Amass, MD;  Location: [facility];  Service: Urology;  Laterality: N/A;  . CYSTO/  URETHRAL DILATION/  HYDRODISTENTION/   INSTILLATION THERAPY  07-16-2010//   12-30-2007//   10-27-2006  . EXERCISE TOLERENCE TEST  10-12-2010   NEGATIVE  ADEQUATE ETT/  NO ISCHEMIA OR EVIDENCE HIGH GRADE OBSTRUCTIVE CAD/  NO FURTHER TEST  NEEDED  . [place] ENDOMETRIAL ABLATION  2014  . LAPAROSCOPIC OVARIAN CYST BX  2005   AND  URETEROSCOPIC LASER LITHO  STONE EXTRACTION  . SHOULDER OPEN ROTATOR CUFF REPAIR Right 2004  . TONSILLECTOMY    . TRANSTHORACIC ECHOCARDIOGRAM  06-07-2006   normal study/  ef 60-65%  . TUBAL LIGATION Bilateral 1995   Past Medical History:  Diagnosis Date  . Anxiety   . Anxiety disorder   . Arthritis   . Chronic low back pain   . Depression   . Dyslipidemia   . Fibromyalgia   . GERD (gastroesophageal reflux disease)   . Headache    hx migraines  . History of kidney stones   . History of panic attacks   . History of renal calculi   . IBS (irritable bowel syndrome)   . Interstitial cystitis   . Lupus    tested positive for antibodies for lupus. dr to do further tests  . Menorrhagia   . OSA on CPAP    not used cpap for several weeks  . Pneumonia    hx  . PONV (postoperative nausea and vomiting)   . RLS (restless legs syndrome)   . Seasonal asthma   . SI (sacroiliac) joint dysfunction   . SUI (stress urinary incontinence, female)   . UTI (lower urinary tract infection)    hx  . 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 02/23/2013   BP 137/83   Pulse 69   SpO2 98%   Opioid Risk Score:  7 Fall Risk Score:  `1  Depression screen PHQ 2/9  Depression screen [facility] 2/9 10/20/2017 10/08/2017 09/19/2017 07/23/2017 06/23/2017 04/09/2017 02/04/2017  Decreased Interest 2 2 1 1 1 3 3   Down, Depressed, Hopeless 2 2 1 1 1 1 3   PHQ - 2 Score 4 4 2 2 2 4 6   Altered sleeping - 3 - - - - -  Tired, decreased energy - 3 - - - - -  Change in appetite - 3 - - - - -  Feeling bad or failure about yourself  - 1 - - - - -  Trouble concentrating - 2 - - - - -  Moving slowly or fidgety/restless - 0 - - - - -  Suicidal thoughts - 0 - - - - -  PHQ-9 Score - 16 - - - - -  Difficult doing work/chores - Somewhat difficult - - - - -    Review of Systems  Constitutional: Negative.   HENT:  Negative.   Eyes: Negative.   Respiratory: Positive for apnea.   Cardiovascular: Negative.   Gastrointestinal: Positive for nausea.  Endocrine: Negative.   Genitourinary: Negative.   Musculoskeletal: Positive for gait problem.       Spasms  Skin: Negative.   Allergic/Immunologic: Negative.   Neurological: Positive for dizziness and numbness.       Tingling  Hematological: Negative.   Psychiatric/Behavioral: The patient is nervous/anxious.   All other systems reviewed and are negative.  Objective:   Physical Exam  Constitutional: She is oriented to person, place, and time. She appears well-developed and well-nourished.  HENT:  Head: Normocephalic and atraumatic.  Neck: Normal range of motion. Neck supple.  Cervical Paraspinal Tenderness: C-5-C-6  Cardiovascular: Normal rate and regular rhythm.  Pulmonary/Chest: Effort normal and breath sounds normal.  Musculoskeletal:  Normal Muscle Bulk and Muscle Testing Reveals: Upper Extremities: Full  ROM and Muscle Strength 5/5 Bilateral AC Joint Tenderness: R>L Thoracic Paraspinal Tenderness: T-1-T-3 Lumbar Paraspinal Tenderness: L-3-L-5 Bilateral Greater Trochanter Tenderness R>L Lower Extremities: Full ROM and Muscle Strength 5/5 Arises from Table slowly Narrow Based Gait   Neurological: She is alert and oriented to person, place, and time.  Skin: Skin is warm and dry.  Psychiatric: She has a normal mood and affect.  Nursing note and vitals reviewed.         Assessment & Plan:  1. Lumbago/ Lumbar Spondylosis: 10/20/2017 Refilled: HYDROcodone 10/325mg  one tablet every 6 hours as needed #120.  We will continue the opioid monitoring program, this consists of regular clinic visits, examinations, urine drug screen, pill counts as well as use of [place] Controlled Substance Reporting System. 2. Fibromyalgia. Continue Current exercise Regime. 10/20/2017 3. Anxiety and depression:Psychiatry Following: Dr. Olivia Mackie  at the  [place]. Continue Counseling at [facility]. On Xanax, 10/20/2017 4. Migraines: On Maxalt. Neurology Following. 10/20/2017 5. OSA : Continue exercise regime as tolerated and losing weight. 10/20/2017 6. Obesity: Following Healthy Diet Regimen. 10/20/2017 7. Status Post Cervical Spinal Fusion: C5C6- C-6- C-7 Anterior Cervical Discectomy Fusion and Allograft Plate: Dr. Lorin Mercy Following. 10/20/2017 8. Cervicalgia/ Cervical Radiculitis/ S/P Cervical Spinal Fusion: Continue Topamax Dr. Lorin Mercy Following. 10/20/2017 9. Muscle Spasm: Continue Tizanidine as needed.10/20/2017 10.Hereditary and Idiopathic Peripheral Neuropathy:Continue  Topamax: Continue with Tens Unit. 10/20/2017 11. Physical Deconditioning: Awaiting on Physical Therapy  Consultation Appointment. 10/20/2017.  20 minutes of face to face patient care time was spent during this visit. All questions were encouraged and answered.   F/U in 78month

## 2017-10-22 DIAGNOSIS — Z6841 Body Mass Index (BMI) 40.0 and over, adult: Secondary | ICD-10-CM | POA: Diagnosis not present

## 2017-10-22 DIAGNOSIS — K219 Gastro-esophageal reflux disease without esophagitis: Secondary | ICD-10-CM | POA: Diagnosis not present

## 2017-10-27 LAB — DRUG TOX MONITOR 1 W/CONF, ORAL FLD
Amphetamines: NEGATIVE ng/mL (ref <10)
Barbiturates: NEGATIVE ng/mL (ref <10)
Benzodiazepines: NEGATIVE ng/mL (ref <0.50)
Buprenorphine: NEGATIVE ng/mL (ref <0.10)
Cocaine: NEGATIVE ng/mL (ref <5.0)
Codeine: NEGATIVE ng/mL (ref <2.5)
Dihydrocodeine: NEGATIVE ng/mL (ref <2.5)
Fentanyl: NEGATIVE ng/mL (ref <0.10)
Heroin Metabolite: NEGATIVE ng/mL (ref <1.0)
Hydrocodone: 13.4 ng/mL — ABNORMAL HIGH (ref <2.5)
Hydromorphone: NEGATIVE ng/mL (ref <2.5)
MARIJUANA: NEGATIVE ng/mL (ref <2.5)
MDMA: NEGATIVE ng/mL (ref <10)
Meprobamate: NEGATIVE ng/mL (ref <2.5)
Methadone: NEGATIVE ng/mL (ref <5.0)
Morphine: NEGATIVE ng/mL (ref <2.5)
Nicotine Metabolite: NEGATIVE ng/mL (ref <5.0)
Norhydrocodone: NEGATIVE ng/mL (ref <2.5)
Noroxycodone: NEGATIVE ng/mL (ref <2.5)
Opiates: POSITIVE ng/mL — AB (ref <2.5)
Oxycodone: NEGATIVE ng/mL (ref <2.5)
Oxymorphone: NEGATIVE ng/mL (ref <2.5)
Phencyclidine: NEGATIVE ng/mL (ref <10)
Tapentadol: NEGATIVE ng/mL (ref <5.0)
Tramadol: NEGATIVE ng/mL (ref <5.0)
Zolpidem: NEGATIVE ng/mL (ref <5.0)

## 2017-10-27 LAB — DRUG TOX ALC METAB W/CON, ORAL FLD: Alcohol Metabolite: NEGATIVE ng/mL (ref <25)

## 2017-10-29 ENCOUNTER — Telehealth: Payer: Self-pay | Admitting: *Deleted

## 2017-10-29 DIAGNOSIS — M545 Low back pain: Secondary | ICD-10-CM | POA: Diagnosis not present

## 2017-10-29 DIAGNOSIS — Z87442 Personal history of urinary calculi: Secondary | ICD-10-CM | POA: Diagnosis not present

## 2017-10-29 NOTE — Telephone Encounter (Signed)
Oral swab drug screen was consistent for prescribed medications.  ?

## 2017-10-30 ENCOUNTER — Ambulatory Visit: Payer: Self-pay | Admitting: Internal Medicine

## 2017-10-31 MED FILL — HYDROCODON-APAP 10-325: 10-325 | 30 days supply | Qty: 120 | Fill #0

## 2017-11-10 DIAGNOSIS — H9203 Otalgia, bilateral: Secondary | ICD-10-CM | POA: Diagnosis not present

## 2017-11-10 DIAGNOSIS — K219 Gastro-esophageal reflux disease without esophagitis: Secondary | ICD-10-CM | POA: Diagnosis not present

## 2017-11-12 ENCOUNTER — Encounter: Payer: Medicare Other | Attending: Physical Medicine & Rehabilitation | Admitting: Physical Medicine & Rehabilitation

## 2017-11-12 ENCOUNTER — Encounter: Payer: Self-pay | Admitting: Physical Medicine & Rehabilitation

## 2017-11-12 DIAGNOSIS — M5412 Radiculopathy, cervical region: Secondary | ICD-10-CM

## 2017-11-12 DIAGNOSIS — M797 Fibromyalgia: Secondary | ICD-10-CM | POA: Insufficient documentation

## 2017-11-12 DIAGNOSIS — R200 Anesthesia of skin: Secondary | ICD-10-CM | POA: Diagnosis not present

## 2017-11-12 DIAGNOSIS — G894 Chronic pain syndrome: Secondary | ICD-10-CM | POA: Diagnosis not present

## 2017-11-12 DIAGNOSIS — M47816 Spondylosis without myelopathy or radiculopathy, lumbar region: Secondary | ICD-10-CM | POA: Diagnosis not present

## 2017-11-12 DIAGNOSIS — R202 Paresthesia of skin: Secondary | ICD-10-CM | POA: Diagnosis not present

## 2017-11-12 DIAGNOSIS — F419 Anxiety disorder, unspecified: Secondary | ICD-10-CM

## 2017-11-12 MED ORDER — HYDROCODONE-ACETAMINOPHEN 10-325 MG PO TABS: ORAL_TABLET | ORAL | 0 refills | Status: DC

## 2017-11-12 NOTE — Progress Notes (Signed)
Subjective:    Patient ID: Karina Greer, female    DOB: [birth year], 52 y.o.   MRN: [id number]  HPI   Karina Greer is here in follow up of her chronic pain and FMS. She never got to outpt PT despite two referrals. She tells me there were scheduling issues at the facility .  She has been making efforts to eat better and lose weight.  She feels that she is making progress there.  She is using less of her hydrocodone and wants to decrease her prescription.  She feels that she is becoming more sensitive to medications and and notes that the Xanax which her psychiatrist prescribes her seems to be too potent at this point.  Psychiatry is recommended trintellix for her mood which she has not started on yet.  She is seeing ear nose and throat physician regarding some issues pertaining to her neck and swallowing.    Pain Inventory Average Pain 8 Pain Right Now 7 My pain is constant, sharp, tingling and aching  In the last 24 hours, has pain interfered with the following? General activity 6 Relation with others 6 Enjoyment of life 6 What TIME of day is your pain at its worst? n/a Sleep (in general) Fair  Pain is worse with: walking, bending, sitting, inactivity, standing and some activites Pain improves with: rest, heat/ice, therapy/exercise, pacing activities, medication and TENS Relief from Meds: 7  Mobility walk without assistance how many minutes can you walk? 15 ability to climb steps?  yes do you drive?  yes Do you have any goals in this area?  yes  Function disabled: date disabled n/a I need assistance with the following:  meal prep, household duties and shopping Do you have any goals in this area?  yes  Neuro/Psych dizziness  Prior Studies Any changes since last visit?  no  Physicians involved in your care Any changes since last visit?  no   Family History  Problem Relation Age of Onset  . Hypertension Mother   . Diabetes Father   . Fibromyalgia Sister   . Hypertension  Other        Cancer, Cerebrovascular disease run on mother side of family   Social History   Socioeconomic History  . Marital status: Divorced    Spouse name: Not on file  . Number of children: 3  . Years of education: HS  . Highest education level: Not on file  Social Needs  . Financial resource strain: Not on file  . Food insecurity - worry: Not on file  . Food insecurity - inability: Not on file  . Transportation needs - medical: Not on file  . Transportation needs - non-medical: Not on file  Occupational History  . Occupation: Merchandiser, retail: UNEMPLOYED    Comment: Disability  Tobacco Use  . Smoking status: Never Smoker  . Smokeless tobacco: Never Used  Substance and Sexual Activity  . Alcohol use: No  . Drug use: No  . Sexual activity: Not on file  Other Topics Concern  . Not on file  Social History Narrative  . Not on file   Past Surgical History:  Procedure Laterality Date  . ACHILLES TENDON SURGERY Right 3/16  . ANTERIOR CERVICAL DECOMP/DISCECTOMY FUSION N/A 03/11/2016   Procedure: C5-6, C6-7 Anterior Cervical Discectomy and Fusion, Allograft, Plate;  Surgeon: Marybelle Killings, MD;  Location: [place] [facility];  Service: Orthopedics;  Laterality: N/A;  . CHOLECYSTECTOMY  2014  . COLONOSCOPY WITH PROPOFOL N/A 08/25/2017  Procedure: COLONOSCOPY WITH PROPOFOL;  Surgeon: Manya Silvas, MD;  Location[facility] ENDOSCOPY;  Service: Endoscopy;  Laterality: N/A;  . CYSTO WITH HYDRODISTENSION N/A 01/26/2014   Procedure: CYSTOSCOPY/HYDRODISTENSION;  Surgeon: Bernestine Amass, MD;  Location: [facility];  Service: Urology;  Laterality: N/A;  . CYSTO/  URETHRAL DILATION/  HYDRODISTENTION/   INSTILLATION THERAPY  07-16-2010//   12-30-2007//   10-27-2006  . EXERCISE TOLERENCE TEST  10-12-2010   NEGATIVE  ADEQUATE ETT/  NO ISCHEMIA OR EVIDENCE HIGH GRADE OBSTRUCTIVE CAD/  NO FURTHER TEST NEEDED  . [place] ENDOMETRIAL ABLATION  2014  . LAPAROSCOPIC OVARIAN  CYST BX  2005   AND  URETEROSCOPIC LASER LITHO  STONE EXTRACTION  . SHOULDER OPEN ROTATOR CUFF REPAIR Right 2004  . TONSILLECTOMY    . TRANSTHORACIC ECHOCARDIOGRAM  06-07-2006   normal study/  ef 60-65%  . TUBAL LIGATION Bilateral 1995   Past Medical History:  Diagnosis Date  . Anxiety   . Anxiety disorder   . Arthritis   . Chronic low back pain   . Depression   . Dyslipidemia   . Fibromyalgia   . GERD (gastroesophageal reflux disease)   . Headache    hx migraines  . History of kidney stones   . History of panic attacks   . History of renal calculi   . IBS (irritable bowel syndrome)   . Interstitial cystitis   . Lupus    tested positive for antibodies for lupus. dr to do further tests  . Menorrhagia   . OSA on CPAP    not used cpap for several weeks  . Pneumonia    hx  . PONV (postoperative nausea and vomiting)   . RLS (restless legs syndrome)   . Seasonal asthma   . SI (sacroiliac) joint dysfunction   . SUI (stress urinary incontinence, female)   . UTI (lower urinary tract infection)    hx  . 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 02/23/2013   There were no vitals taken for this visit.  Opioid Risk Score:   Fall Risk Score:  `1  Depression screen PHQ 2/9  Depression screen [facility] 2/9 10/20/2017 10/08/2017 09/19/2017 07/23/2017 06/23/2017 04/09/2017 02/04/2017  Decreased Interest 2 2 1 1 1 3 3   Down, Depressed, Hopeless 2 2 1 1 1 1 3   PHQ - 2 Score 4 4 2 2 2 4 6   Altered sleeping - 3 - - - - -  Tired, decreased energy - 3 - - - - -  Change in appetite - 3 - - - - -  Feeling bad or failure about yourself  - 1 - - - - -  Trouble concentrating - 2 - - - - -  Moving slowly or fidgety/restless - 0 - - - - -  Suicidal thoughts - 0 - - - - -  PHQ-9 Score - 16 - - - - -  Difficult doing work/chores - Somewhat difficult - - - - -      Review of Systems  Constitutional: Positive for appetite change.  Respiratory: Positive for apnea.   Gastrointestinal: Positive for  nausea.  Neurological: Positive for dizziness.  All other systems reviewed and are negative.      Objective:   Physical Exam  Constitutional: She is oriented to person, place, and time. She appears well-developedand well-nourished.  HENT:  Head: Normocephalic.  Neck: Normal range of motion. Neck supple.    Surgical scar noted Cardiovascular:  Regular rate.  Pulmonary/Chest:  Clear.  Musculoskeletal:   Generalized tenderness over the lumbar paraspinals greater trochanters as well as numerous other tender points in her body.  Neck with mild tenderness posteriorly and near her operative site.    Neurological: She is alertand oriented to person, place, and time.  Strength is 5 out of 5 and sensation appears normal Skin: Skin is warmand dry.  Psychiatric: She remains anxious and is often very difficult to redirect she is overall pleasant.        Assessment & Plan:  1.Lumbar spondylosis/facet arthropathy:  Reduced hydrocodone 10/325-1 every 8 hours as needed #90.. -We will continue the opioid monitoring program, this consists of regular clinic visits, examinations, routine drug screening, pill counts as well as use of [place] Controlled Substance Reporting System. [place]CC[place] was reviewed today.               -ortho following for surgical options.             -will make another referral for PT to address ROM/modalities----HEP (she will go to facility in [facility]) 2. Fibromyalgia. Continue Current exercise Regime/HEP             -continue with HEP, outpt PT 3. Anxiety and depression: continue with psych follow up   -I reiterated to her that her anxiety and overall perspective remains her biggest barrier 4. Migraines: On Maxalt. Headaches are rare now  5. OSA : CPAP.   6. Obesity: Following Healthy Diet Regimen 7. Status Post Cervical Spinal Fusion: C5C6- C-6- C-7 Anterior Cervical Discectomy Fusion and Allograft Plate.: Dr. Lorin Mercy Following             -7 months  post-op 8. Muscle Spasm: Continue Tizanidine  20 minutes of face to face patient care time was spent during this visit. All questions were encouraged and answered.   F/U in 1 months with  NP

## 2017-11-12 NOTE — Patient Instructions (Signed)
PLEASE FEEL FREE TO CALL OUR OFFICE WITH ANY PROBLEMS OR QUESTIONS ([phone])      

## 2017-11-15 ENCOUNTER — Other Ambulatory Visit: Payer: Self-pay | Admitting: Registered Nurse

## 2017-11-17 DIAGNOSIS — M542 Cervicalgia: Secondary | ICD-10-CM | POA: Diagnosis not present

## 2017-11-17 DIAGNOSIS — H9203 Otalgia, bilateral: Secondary | ICD-10-CM | POA: Diagnosis not present

## 2017-11-19 ENCOUNTER — Ambulatory Visit (INDEPENDENT_AMBULATORY_CARE_PROVIDER_SITE_OTHER): Payer: Medicare Other

## 2017-11-19 DIAGNOSIS — J301 Allergic rhinitis due to pollen: Secondary | ICD-10-CM

## 2017-11-19 DIAGNOSIS — M810 Age-related osteoporosis without current pathological fracture: Secondary | ICD-10-CM | POA: Diagnosis not present

## 2017-11-20 ENCOUNTER — Other Ambulatory Visit: Payer: Self-pay

## 2017-11-20 DIAGNOSIS — M47819 Spondylosis without myelopathy or radiculopathy, site unspecified: Secondary | ICD-10-CM | POA: Diagnosis not present

## 2017-11-20 DIAGNOSIS — R591 Generalized enlarged lymph nodes: Secondary | ICD-10-CM | POA: Diagnosis not present

## 2017-11-20 DIAGNOSIS — H9203 Otalgia, bilateral: Secondary | ICD-10-CM | POA: Diagnosis not present

## 2017-11-20 DIAGNOSIS — I10 Essential (primary) hypertension: Secondary | ICD-10-CM | POA: Diagnosis not present

## 2017-11-20 DIAGNOSIS — H9041 Sensorineural hearing loss, unilateral, right ear, with unrestricted hearing on the contralateral side: Secondary | ICD-10-CM | POA: Diagnosis not present

## 2017-11-20 MED ORDER — TOPIRAMATE 25 MG PO TABS: 25.0000 mg | ORAL_TABLET | Freq: Every day | ORAL | 2 refills | Status: DC

## 2017-11-28 ENCOUNTER — Telehealth: Payer: Self-pay | Admitting: Registered Nurse

## 2017-11-28 DIAGNOSIS — F419 Anxiety disorder, unspecified: Secondary | ICD-10-CM

## 2017-11-28 MED ORDER — HYDROCODONE-ACETAMINOPHEN 10-325 MG PO TABS: ORAL_TABLET | ORAL | 0 refills | Status: DC

## 2017-11-28 MED FILL — HYDROCODON-APAP 10-325: 10-325 | 30 days supply | Qty: 90 | Fill #0

## 2017-11-28 NOTE — Telephone Encounter (Signed)
Ms. Lepkowski dropped off her printed prescription of Hydrocodone, [facility] unable to fill relating to [facility] #. Hydrocodone prescription e-scribed.

## 2017-12-03 ENCOUNTER — Encounter: Payer: Self-pay | Admitting: Internal Medicine

## 2017-12-03 ENCOUNTER — Ambulatory Visit (INDEPENDENT_AMBULATORY_CARE_PROVIDER_SITE_OTHER): Payer: Medicare Other | Admitting: Internal Medicine

## 2017-12-03 VITALS — BP 138/84 | HR 71 | Resp 16 | Ht 63.0 in | Wt 235.0 lb

## 2017-12-03 DIAGNOSIS — G4733 Obstructive sleep apnea (adult) (pediatric): Secondary | ICD-10-CM | POA: Diagnosis not present

## 2017-12-03 DIAGNOSIS — K219 Gastro-esophageal reflux disease without esophagitis: Secondary | ICD-10-CM | POA: Diagnosis not present

## 2017-12-03 DIAGNOSIS — Z9989 Dependence on other enabling machines and devices: Secondary | ICD-10-CM | POA: Diagnosis not present

## 2017-12-03 DIAGNOSIS — J301 Allergic rhinitis due to pollen: Secondary | ICD-10-CM

## 2017-12-03 DIAGNOSIS — E7800 Pure hypercholesterolemia, unspecified: Secondary | ICD-10-CM | POA: Diagnosis not present

## 2017-12-03 DIAGNOSIS — R1032 Left lower quadrant pain: Secondary | ICD-10-CM | POA: Insufficient documentation

## 2017-12-03 DIAGNOSIS — T50905A Adverse effect of unspecified drugs, medicaments and biological substances, initial encounter: Secondary | ICD-10-CM | POA: Diagnosis not present

## 2017-12-03 NOTE — Progress Notes (Signed)
[facility] [place], [place] [address]  Pulmonary Sleep Medicine  Office Visit Note  Patient Name: Karina Greer DOB: 1965/10/03 MRN [id number]  Date of Service: 12/03/2017  Complaints/HPI:  Patient is here for follow-up of allergies.  Doing fairly well right now she did notice increase in her symptoms since the spring this started.  She does better she states when she is on 2 times per month allergies shock.  So we will increase her dosage is.  Right now she has no issues with her sleep machine she should be using her humidifier are if possible.  Right now she is also working on losing weight and she has been somewhat successful with that.  She also noted that she feels little tired with Zyrtec I suggested she switch over to Claritin.  ROS  General: (-) fever, (-) chills, (-) night sweats, (-) weakness Skin: (-) rashes, (-) itching,. Eyes: (-) visual changes, (-) redness, (-) itching. Nose and Sinuses: (-) nasal stuffiness or itchiness, (-) postnasal drip, (-) nosebleeds, (-) sinus trouble. Mouth and Throat: (-) sore throat, (-) hoarseness. Neck: (-) swollen glands, (-) enlarged thyroid, (-) neck pain. Respiratory: + cough, (-) bloody sputum, + shortness of breath, + wheezing. Cardiovascular: - ankle swelling, (-) chest pain. Lymphatic: (-) lymph node enlargement. Neurologic: (-) numbness, (-) tingling. Psychiatric: (-) anxiety, (-) depression   Current Medication: Outpatient Encounter Medications as of 12/03/2017  Medication Sig Note  . albuterol (PROVENTIL HFA;VENTOLIN HFA) 108 (90 BASE) MCG/ACT inhaler Inhale 2 puffs into the lungs every 6 (six) hours as needed.    [name] albuterol (PROVENTIL) (2.5 MG/3ML) 0.083% nebulizer solution INHALE 1 VIAL VIA NEBULIZER EVERY 6 HOURS AS NEEDED   . ALPRAZolam (XANAX) 1 MG tablet TAKE 1/2 TABLET BY MOUTH EVERY MORNING & TAKE 1 TABLET AT BEDTIME 10/20/2017: Has not taken in more than a week  . Azelastine HCl 137 MCG/SPRAY  SOLN Place 2 Squirts into the nose 2 (two) times daily.   [name] b complex vitamins tablet Take 1 tablet by mouth daily.   . cetirizine (ZYRTEC) 10 MG tablet TAKE 1 TABLET BY MOUTH EVERY DAY FOR ALLERGIES   . DEXILANT 30 MG capsule Take 1 capsule by mouth 2 (two) times daily.   [name] dicyclomine (BENTYL) 10 MG capsule Take 10 mg by mouth 2 (two) times daily. As needed   . EPIPEN 2-PAK 0.3 MG/0.3ML SOAJ injection 0.3 mg See admin instructions.  03/11/2016: Never used medication  . fluticasone (FLONASE) 50 MCG/ACT nasal spray Place 1 spray into both nostrils daily as needed for allergies.    [name] HYDROcodone-acetaminophen (NORCO) 10-325 MG tablet TAKE 1 TABLET BY MOUTH EVERY 8 HOURS AS NEEDED FOR PAIN   . meclizine (ANTIVERT) 25 MG tablet Take 1 tablet by mouth 3 (three) times daily as needed.   . montelukast (SINGULAIR) 10 MG tablet Take 10 mg by mouth every morning.   . ondansetron (ZOFRAN-ODT) 4 MG disintegrating tablet Take 1 tablet by mouth 3 (three) times daily as needed.   [name] PRESCRIPTION MEDICATION every 14 (fourteen) days. 2 ALLERGY INJECTIONS EVERY 2 WKS   . raloxifene (EVISTA) 60 MG tablet Take 60 mg by mouth daily.   . rizatriptan (MAXALT) 10 MG tablet Take 1 tablet (10 mg total) by mouth 2 (two) times daily as needed. May repeat in 2 hours if needed   . tiZANidine (ZANAFLEX) 2 MG tablet Take 1 tablet (2 mg total) by mouth every 6 (six) hours as needed.   [name]  topiramate (TOPAMAX) 25 MG tablet Take 1 tablet (25 mg total) by mouth at bedtime.   . TRINTELLIX 10 MG TABS tablet TAKE 1 TABLET BY MOUTH EVERY DAY (AFTER FINISHING THE 5MG )   . Vitamin D, Ergocalciferol, (DRISDOL) 50000 units CAPS capsule Take 50,000 Units by mouth every 7 (seven) days. THURSDAYS    No facility-administered encounter medications on file as of 12/03/2017.     Surgical History: Past Surgical History:  Procedure Laterality Date  . ACHILLES TENDON SURGERY Right 3/16  . ANTERIOR CERVICAL DECOMP/DISCECTOMY FUSION N/A 03/11/2016    Procedure: C5-6, C6-7 Anterior Cervical Discectomy and Fusion, Allograft, Plate;  Surgeon: Marybelle Killings, MD;  Location: [facility];  Service: Orthopedics;  Laterality: N/A;  . CHOLECYSTECTOMY  2014  . COLONOSCOPY WITH PROPOFOL N/A 08/25/2017   Procedure: COLONOSCOPY WITH PROPOFOL;  Surgeon: Manya Silvas, MD;  Location: [facility] ENDOSCOPY;  Service: Endoscopy;  Laterality: N/A;  . CYSTO WITH HYDRODISTENSION N/A 01/26/2014   Procedure: CYSTOSCOPY/HYDRODISTENSION;  Surgeon: Bernestine Amass, MD;  Location: [facility];  Service: Urology;  Laterality: N/A;  . CYSTO/  URETHRAL DILATION/  HYDRODISTENTION/   INSTILLATION THERAPY  07-16-2010//   12-30-2007//   10-27-2006  . EXERCISE TOLERENCE TEST  10-12-2010   NEGATIVE  ADEQUATE ETT/  NO ISCHEMIA OR EVIDENCE HIGH GRADE OBSTRUCTIVE CAD/  NO FURTHER TEST NEEDED  . [place] ENDOMETRIAL ABLATION  2014  . LAPAROSCOPIC OVARIAN CYST BX  2005   AND  URETEROSCOPIC LASER LITHO  STONE EXTRACTION  . SHOULDER OPEN ROTATOR CUFF REPAIR Right 2004  . TONSILLECTOMY    . TRANSTHORACIC ECHOCARDIOGRAM  06-07-2006   normal study/  ef 60-65%  . TUBAL LIGATION Bilateral 1995    Medical History: Past Medical History:  Diagnosis Date  . Anxiety   . Anxiety disorder   . Arthritis   . Chronic low back pain   . Depression   . Dyslipidemia   . Fibromyalgia   . GERD (gastroesophageal reflux disease)   . Headache    hx migraines  . History of kidney stones   . History of panic attacks   . History of renal calculi   . IBS (irritable bowel syndrome)   . Interstitial cystitis   . Lupus    tested positive for antibodies for lupus. dr to do further tests  . Menorrhagia   . OSA on CPAP    not used cpap for several weeks  . Pneumonia    hx  . PONV (postoperative nausea and vomiting)   . RLS (restless legs syndrome)   . Seasonal asthma   . SI (sacroiliac) joint dysfunction   . SUI (stress urinary incontinence, female)   . UTI (lower urinary  tract infection)    hx  . 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 02/23/2013    Family History: Family History  Problem Relation Age of Onset  . Hypertension Mother   . Diabetes Father   . Fibromyalgia Sister   . Hypertension Other        Cancer, Cerebrovascular disease run on mother side of family    Social History: Social History   Socioeconomic History  . Marital status: Divorced    Spouse name: Not on file  . Number of children: 3  . Years of education: HS  . Highest education level: Not on file  Social Needs  . Financial resource strain: Not on file  . Food insecurity - worry: Not on file  . Food  insecurity - inability: Not on file  . Transportation needs - medical: Not on file  . Transportation needs - non-medical: Not on file  Occupational History  . Occupation: Merchandiser, retail: UNEMPLOYED    Comment: Disability  Tobacco Use  . Smoking status: Never Smoker  . Smokeless tobacco: Never Used  Substance and Sexual Activity  . Alcohol use: No  . Drug use: No  . Sexual activity: Not on file  Other Topics Concern  . Not on file  Social History Narrative  . Not on file    Vital Signs: Blood pressure 138/84, pulse 71, resp. rate 16, height 5\' 3"  (1.6 m), weight 235 lb (106.6 kg), SpO2 96 %.  Examination: General Appearance: The patient is well-developed, well-nourished, and in no distress. Skin: Gross inspection of skin unremarkable. Head: normocephalic, no gross deformities. Eyes: no gross deformities noted. ENT: ears appear grossly normal no exudates. Neck: Supple. No thyromegaly. No LAD. Respiratory: no rhonchi noted. Cardiovascular: Normal S1 and S2 without murmur or rub. Extremities: No cyanosis. pulses are equal. Neurologic: Alert and oriented. No involuntary movements.  LABS: Recent Results (from the past 2160 hour(s))  Drug Tox Alc Metab w/Con, Oral Fld     Status: None   Collection Time: 10/20/17  9:28 AM  Result Value Ref Range    Alcohol Metabolite NEGATIVE <25 ng/mL    Comment: . For additional information, please refer to http://education.questdiagnostics.com/faq/FAQ186 (This link is being provided for informational/ educational purposes only.) . This drug testing is for medical treatment only. Analysis was performed as non-forensic testing and these results should be used only by healthcare providers to render diagnosis or treatment, or to monitor progress of medical conditions. . For assistance with interpreting these drug results, please contact a [employer] Toxicology Specialist: [phone] [place] [phone]), M-F, 8am-6pm EST. [name] These tests were developed and their analytical performance characteristics have been determined by [facility]. They have not been cleared or approved by the FDA. These assays have been validated pursuant to the CLIA regulations and are used for clinical purposes.   Drug Tox Monitor 1 w/Conf, Oral Fld     Status: Abnormal   Collection Time: 10/20/17  9:28 AM  Result Value Ref Range   Amphetamines NEGATIVE <10 ng/mL   Barbiturates NEGATIVE <10 ng/mL   Benzodiazepines NEGATIVE <0.50 ng/mL   Buprenorphine NEGATIVE <0.10 ng/mL   Cocaine NEGATIVE <5.0 ng/mL   Fentanyl NEGATIVE <0.10 ng/mL   Heroin Metabolite NEGATIVE <1.0 ng/mL   MARIJUANA NEGATIVE <2.5 ng/mL   MDMA NEGATIVE <10 ng/mL   Meprobamate NEGATIVE <2.5 ng/mL   Methadone NEGATIVE <5.0 ng/mL   Nicotine Metabolite NEGATIVE <5.0 ng/mL   Opiates POSITIVE (A) <2.5 ng/mL   Codeine Negative <2.5 ng/mL   Dihydrocodeine Negative <2.5 ng/mL   Hydrocodone 13.4 (H) <2.5 ng/mL    Comment: . Hydrocodone is a minor metabolite of codeine as well as prescribed drug. Hydrocodone is a pharmaceutical impurity of oxycodone (up to 1%). [name]    Hydromorphone Negative <2.5 ng/mL   Morphine Negative <2.5 ng/mL   Norhydrocodone Negative <2.5 ng/mL   Noroxycodone Negative <2.5 ng/mL   Oxycodone Negative <2.5 ng/mL    Oxymorphone Negative <2.5 ng/mL   Phencyclidine NEGATIVE <10 ng/mL   Tapentadol NEGATIVE <5.0 ng/mL   Tramadol NEGATIVE <5.0 ng/mL   Zolpidem NEGATIVE <5.0 ng/mL    Comment: . For additional information, please refer to http://education.questdiagnostics.com/faq/FAQ186 (This link is being provided for informational/ educational purposes only.) . This drug testing  is for medical treatment only. Analysis was performed as non-forensic testing and these results should be used only by healthcare providers to render diagnosis or treatment, or to monitor progress of medical conditions. . For assistance with interpreting these drug results, please contact a [employer] Toxicology Specialist: [phone] [place] [phone]), M-F, 8am-6pm EST. [name] These tests were developed and their analytical performance characteristics have been determined by [facility]. They have not been cleared or approved by the FDA. These assays have been validated pursuant to the CLIA regulations and are used for clinical purposes.     Radiology: No results found.  No results found.  No results found.    Assessment and Plan: Patient Active Problem List   Diagnosis Date Noted  . LLQ pain 12/03/2017  . Pre-diabetes 06/03/2017  . Rectal bleeding 06/02/2017  . S/P cervical spinal fusion 03/11/2016  . Bilateral hand pain 11/29/2015  . Elevated C-reactive protein 11/29/2015  . Elevated rheumatoid factor 11/29/2015  . Raynaud's phenomenon without gangrene 11/29/2015  . Bloating 09/12/2014  . Dizziness 08/08/2014  . Peri-menopause 08/08/2014  . Chronic tension-type headache, intractable 07/13/2014  . Gastroesophageal reflux disease without esophagitis 07/12/2014  . Anxiety 04/18/2014  . Chronic arthritis 04/18/2014  . Cystitis 04/18/2014  . Morbid obesity ([place]) 04/18/2014  . Fibromyalgia 04/18/2014  . 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 Osteoporosis, post-menopausal  04/18/2014  . Arthropathy 04/18/2014  . Cephalalgia 04/04/2014  . Cervical pain 04/04/2014  . Numbness and tingling 04/04/2014  . 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 Nonspecific abnormal findings on radiological and other examination of skull and head 02/23/2013  . Other nonspecific abnormal result of function study of brain and central nervous system 07/01/2012  . Disturbance of skin sensation 07/01/2012  . Migraine without aura 07/01/2012  . Abdominal pain 02/11/2012  . Chronic fatigue syndrome 02/11/2012  . Chronic interstitial cystitis 02/11/2012  . Dyspareunia 02/11/2012  . Dysuria 02/11/2012  . Female stress incontinence 02/11/2012  . Irritable bowel syndrome with diarrhea 02/11/2012  . Nausea and vomiting 02/11/2012  . Nocturia 02/11/2012  . Urge incontinence 02/11/2012  . Urinary urgency 02/11/2012  . Lumbar spondylosis 02/04/2012  . Myalgia and myositis 02/04/2012  . Depression 02/04/2012  . Lumbosacral spondylosis without myelopathy 02/04/2012  . OBESITY, UNSPECIFIED 10/03/2010  . GENERALIZED ANXIETY DISORDER 10/03/2010  . CHEST PAIN, PRECORDIAL 10/03/2010    1. Allergic Rhinitis  Continue with allergy shots will go to twice month as she was on before 2. OSA controlled with CPAP will continue with present pressures 3. GERD she will continue with PPI therapy will continue to work with Weight loss 4. Obesity working on weight loss with weight watchers  General Counseling: I have discussed the findings of the evaluation and examination with Rivka.  I have also discussed any further diagnostic evaluation thatmay be needed or ordered today. Salome verbalizes understanding of the findings of todays visit. We also reviewed her medications today and discussed drug interactions and side effects including but not limited excessive drowsiness and altered mental states. We also discussed that there is always a risk not just to her but also people around her. she  has been encouraged to call the office with any questions or concerns that should arise related to todays visit.    Time spent: 55min  I have personally obtained a history, examined the patient, evaluated laboratory and imaging results, formulated the assessment and plan and placed orders.    Allyne Gee, MD [facility]  Pulmonary and Critical Care Sleep medicine

## 2017-12-03 NOTE — Patient Instructions (Signed)
Allergies An allergy is when your body reacts to a substance in a way that is not normal. An allergic reaction can happen after you:  Eat something.  Breathe in something.  Touch something.  You can be allergic to:  Things that are only around during certain seasons, like molds and pollens.  Foods.  Drugs.  Insects.  Animal dander.  What are the signs or symptoms?  Puffiness (swelling). This may happen on the lips, face, tongue, mouth, or throat.  Sneezing.  Coughing.  Breathing loudly (wheezing).  Stuffy nose.  Tingling in the mouth.  A rash.  Itching.  Itchy, red, puffy areas of skin (hives).  Watery eyes.  Throwing up (vomiting).  Watery poop (diarrhea).  Dizziness.  Feeling faint or fainting.  Trouble breathing or swallowing.  A tight feeling in the chest.  A fast heartbeat. How is this diagnosed? Allergies can be diagnosed with:  A medical and family history.  Skin tests.  Blood tests.  A food diary. A food diary is a record of all the foods, drinks, and symptoms you have each day.  The results of an elimination diet. This diet involves making sure not to eat certain foods and then seeing what happens when you start eating them again.  How is this treated? There is no cure for allergies, but allergic reactions can be treated with medicine. Severe reactions usually need to be treated at a hospital. How is this prevented? The best way to prevent an allergic reaction is to avoid the thing you are allergic to. Allergy shots and medicines can also help prevent reactions in some cases. This information is not intended to replace advice given to you by your health care provider. Make sure you discuss any questions you have with your health care provider. Document Released: 01/04/2013 Document Revised: 05/06/2016 Document Reviewed: 06/21/2014 Elsevier Interactive Patient Education  2018 [employer]  

## 2017-12-04 ENCOUNTER — Ambulatory Visit: Payer: Self-pay | Admitting: Internal Medicine

## 2017-12-04 DIAGNOSIS — E7800 Pure hypercholesterolemia, unspecified: Secondary | ICD-10-CM | POA: Diagnosis not present

## 2017-12-04 DIAGNOSIS — T50905A Adverse effect of unspecified drugs, medicaments and biological substances, initial encounter: Secondary | ICD-10-CM | POA: Diagnosis not present

## 2017-12-10 ENCOUNTER — Encounter: Payer: Medicare Other | Admitting: Registered Nurse

## 2017-12-15 ENCOUNTER — Other Ambulatory Visit: Payer: Self-pay

## 2017-12-15 ENCOUNTER — Encounter: Payer: Medicare Other | Attending: Physical Medicine & Rehabilitation | Admitting: Registered Nurse

## 2017-12-15 ENCOUNTER — Encounter: Payer: Self-pay | Admitting: Registered Nurse

## 2017-12-15 ENCOUNTER — Encounter: Payer: Self-pay | Admitting: Neurology

## 2017-12-15 VITALS — BP 136/93 | HR 73

## 2017-12-15 DIAGNOSIS — G609 Hereditary and idiopathic neuropathy, unspecified: Secondary | ICD-10-CM

## 2017-12-15 DIAGNOSIS — Z5181 Encounter for therapeutic drug level monitoring: Secondary | ICD-10-CM

## 2017-12-15 DIAGNOSIS — G894 Chronic pain syndrome: Secondary | ICD-10-CM

## 2017-12-15 DIAGNOSIS — R200 Anesthesia of skin: Secondary | ICD-10-CM | POA: Diagnosis not present

## 2017-12-15 DIAGNOSIS — M5412 Radiculopathy, cervical region: Secondary | ICD-10-CM

## 2017-12-15 DIAGNOSIS — M797 Fibromyalgia: Secondary | ICD-10-CM | POA: Insufficient documentation

## 2017-12-15 DIAGNOSIS — F419 Anxiety disorder, unspecified: Secondary | ICD-10-CM | POA: Diagnosis not present

## 2017-12-15 DIAGNOSIS — Z981 Arthrodesis status: Secondary | ICD-10-CM | POA: Diagnosis not present

## 2017-12-15 DIAGNOSIS — M62838 Other muscle spasm: Secondary | ICD-10-CM | POA: Diagnosis not present

## 2017-12-15 DIAGNOSIS — Z79899 Other long term (current) drug therapy: Secondary | ICD-10-CM

## 2017-12-15 DIAGNOSIS — M47816 Spondylosis without myelopathy or radiculopathy, lumbar region: Secondary | ICD-10-CM | POA: Diagnosis not present

## 2017-12-15 DIAGNOSIS — R202 Paresthesia of skin: Secondary | ICD-10-CM | POA: Insufficient documentation

## 2017-12-15 DIAGNOSIS — M542 Cervicalgia: Secondary | ICD-10-CM

## 2017-12-15 DIAGNOSIS — F3289 Other specified depressive episodes: Secondary | ICD-10-CM | POA: Diagnosis not present

## 2017-12-15 MED ORDER — HYDROCODONE-ACETAMINOPHEN 10-325 MG PO TABS: ORAL_TABLET | ORAL | 0 refills | Status: DC

## 2017-12-15 NOTE — Progress Notes (Signed)
Subjective:    Patient ID: Karina Greer, female    DOB: July 26, 1966, 52 y.o.   MRN: [id number]  HPI: Karina Greer is a 52year old female who returns for follow up appointmentfor chronic pain and medication refill. She states her pain is located in her neck radiating into her left shoulder  and lower back. She rates her pain 8. Her current exercise regime is walking.   Karina Greer reports she had Prolia injection by her PCP and develop abdominal discomfort, cramping and diarrhea, PCP following.   Karina Greer Morphine equivalent is 37.00 MME. She is also prescribed Alprazolam 1 mg by Natasha Bence NP/Dr. Olivia Mackie, psychiatrist at the [facility]. We have rviewed the black box warning again regarding  using opioids and benzodiazepines. She is being closely monitored and under the care of her psychiatrist Dr. Olivia Mackie. She verbalizes understanding.   Last Oral Swab  was performed on 10/20/2017 it was consistent.    Pain Inventory Average Pain 7 Pain Right Now 8 My pain is constant, sharp, stabbing, tingling and aching  In the last 24 hours, has pain interfered with the following? General activity 6 Relation with others 7 Enjoyment of life 5 What TIME of day is your pain at its worst? all Sleep (in general) Fair  Pain is worse with: walking, bending, sitting, inactivity, standing and some activites Pain improves with: rest, heat/ice, therapy/exercise, pacing activities, medication and TENS Relief from Meds: 6  Mobility walk without assistance how many minutes can you walk? 30 ability to climb steps?  yes do you drive?  yes  Function disabled: date disabled . I need assistance with the following:  meal prep, household duties and shopping  Neuro/Psych tingling spasms dizziness anxiety  Prior Studies Any changes since last visit?  yes Prolea injection  Physicians involved in your care Any changes since last visit?  no   Family History  Problem Relation Age of Onset   . Hypertension Mother   . Diabetes Father   . Fibromyalgia Sister   . Hypertension Other        Cancer, Cerebrovascular disease run on mother side of family   Social History   Socioeconomic History  . Marital status: Divorced    Spouse name: Not on file  . Number of children: 3  . Years of education: HS  . Highest education level: Not on file  Occupational History  . Occupation: Merchandiser, retail: UNEMPLOYED    Comment: Disability  Social Needs  . Financial resource strain: Not on file  . Food insecurity:    Worry: Not on file    Inability: Not on file  . Transportation needs:    Medical: Not on file    Non-medical: Not on file  Tobacco Use  . Smoking status: Never Smoker  . Smokeless tobacco: Never Used  Substance and Sexual Activity  . Alcohol use: No  . Drug use: No  . Sexual activity: Not on file  Lifestyle  . Physical activity:    Days per week: Not on file    Minutes per session: Not on file  . Stress: Not on file  Relationships  . Social connections:    Talks on phone: Not on file    Gets together: Not on file    Attends religious service: Not on file    Active member of club or organization: Not on file    Attends meetings of clubs or organizations: Not on file  Relationship status: Not on file  Other Topics Concern  . Not on file  Social History Narrative  . Not on file   Past Surgical History:  Procedure Laterality Date  . ACHILLES TENDON SURGERY Right 3/16  . ANTERIOR CERVICAL DECOMP/DISCECTOMY FUSION N/A 03/11/2016   Procedure: C5-6, C6-7 Anterior Cervical Discectomy and Fusion, Allograft, Plate;  Surgeon: Marybelle Killings, MD;  Location: [facility];  Service: Orthopedics;  Laterality: N/A;  . CHOLECYSTECTOMY  2014  . COLONOSCOPY WITH PROPOFOL N/A 08/25/2017   Procedure: COLONOSCOPY WITH PROPOFOL;  Surgeon: Manya Silvas, MD;  [facility] ENDOSCOPY;  Service: Endoscopy;  Laterality: N/A;  . CYSTO WITH HYDRODISTENSION N/A 01/26/2014    Procedure: CYSTOSCOPY/HYDRODISTENSION;  Surgeon: Bernestine Amass, MD;  Location: [facility];  Service: Urology;  Laterality: N/A;  . CYSTO/  URETHRAL DILATION/  HYDRODISTENTION/   INSTILLATION THERAPY  07-16-2010//   12-30-2007//   10-27-2006  . EXERCISE TOLERENCE TEST  10-12-2010   NEGATIVE  ADEQUATE ETT/  NO ISCHEMIA OR EVIDENCE HIGH GRADE OBSTRUCTIVE CAD/  NO FURTHER TEST NEEDED  . [place] ENDOMETRIAL ABLATION  2014  . LAPAROSCOPIC OVARIAN CYST BX  2005   AND  URETEROSCOPIC LASER LITHO  STONE EXTRACTION  . SHOULDER OPEN ROTATOR CUFF REPAIR Right 2004  . TONSILLECTOMY    . TRANSTHORACIC ECHOCARDIOGRAM  06-07-2006   normal study/  ef 60-65%  . TUBAL LIGATION Bilateral 1995   Past Medical History:  Diagnosis Date  . Anxiety   . Anxiety disorder   . Arthritis   . Chronic low back pain   . Depression   . Dyslipidemia   . Fibromyalgia   . GERD (gastroesophageal reflux disease)   . Headache    hx migraines  . History of kidney stones   . History of panic attacks   . History of renal calculi   . IBS (irritable bowel syndrome)   . Interstitial cystitis   . Lupus    tested positive for antibodies for lupus. dr to do further tests  . Menorrhagia   . OSA on CPAP    not used cpap for several weeks  . Pneumonia    hx  . PONV (postoperative nausea and vomiting)   . RLS (restless legs syndrome)   . Seasonal asthma   . SI (sacroiliac) joint dysfunction   . SUI (stress urinary incontinence, female)   . UTI (lower urinary tract infection)    hx  . 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 02/23/2013   BP (!) 136/93   Pulse 73   SpO2 96%   Opioid Risk Score:  7 Fall Risk Score:  `1  Depression screen PHQ 2/9  Depression screen [facility] 2/9 12/15/2017 12/03/2017 10/20/2017 10/08/2017 09/19/2017 07/23/2017 06/23/2017  Decreased Interest 2 0 2 2 1 1 1   Down, Depressed, Hopeless 2 0 2 2 1 1 1   PHQ - 2 Score 4 0 4 4 2 2 2   Altered sleeping - - - 3 - - -    Tired, decreased energy - - - 3 - - -  Change in appetite - - - 3 - - -  Feeling bad or failure about yourself  - - - 1 - - -  Trouble concentrating - - - 2 - - -  Moving slowly or fidgety/restless - - - 0 - - -  Suicidal thoughts - - - 0 - - -  PHQ-9 Score - - - 16 - - -  Difficult doing work/chores - - - Somewhat difficult - - -    Review of Systems  Constitutional: Negative.   HENT: Negative.   Eyes: Negative.   Respiratory: Positive for apnea.   Cardiovascular: Negative.   Gastrointestinal: Positive for nausea.  Endocrine: Negative.   Genitourinary: Negative.   Musculoskeletal: Positive for gait problem.       Spasms  Skin: Negative.   Allergic/Immunologic: Negative.   Neurological: Positive for dizziness and numbness.       Tingling  Hematological: Negative.   Psychiatric/Behavioral: The patient is nervous/anxious.   All other systems reviewed and are negative.      Objective:   Physical Exam  Constitutional: She is oriented to person, place, and time. She appears well-developed and well-nourished.  HENT:  Head: Normocephalic and atraumatic.  Neck: Normal range of motion. Neck supple.  No Cervical Paraspinal Tenderness:  Cardiovascular: Normal rate and regular rhythm.  Pulmonary/Chest: Effort normal and breath sounds normal.  Musculoskeletal:  Normal Muscle Bulk and Muscle Testing Reveals: Upper Extremities: Full ROM and Muscle Strength 5/5 Lumbar Paraspinal Tenderness: L-3-L-5 Lower Extremities: Full ROM and Muscle Strength 5/5 Arises from Table slowly Narrow Based Gait   Neurological: She is alert and oriented to person, place, and time.  Skin: Skin is warm and dry.  Psychiatric: She has a normal mood and affect.  Nursing note and vitals reviewed.         Assessment & Plan:  1. Lumbago/ Lumbar Spondylosis: 12/15/2017 Refilled: HYDROcodone 10/325mg  one tablet every 8 hours as needed # 90 We will continue the opioid monitoring program, this consists  of regular clinic visits, examinations, urine drug screen, pill counts as well as use of [place] Controlled Substance Reporting System. 2. Fibromyalgia. Continue Current exercise Regime. 12/15/2017 3. Anxiety and depression:Psychiatry Following: Dr. Olivia Mackie  at the [facility]. Continue Counseling at [facility]. On Xanax, 12/15/2017 4. Migraines: On Maxalt. Neurology Following. 12/15/2017 5. OSA : Continue exercise regime as tolerated and losing weight. 12/15/2017 6. Obesity: Following Healthy Diet Regimen. 10/20/2017 7. Status Post Cervical Spinal Fusion: C5C6- C-6- C-7 Anterior Cervical Discectomy Fusion and Allograft Plate: Dr. Lorin Mercy Following. 12/15/2017 8. Cervicalgia/ Cervical Radiculitis/ S/P Cervical Spinal Fusion: Continue Topamax Dr. Lorin Mercy Following. 12/15/2017 9. Muscle Spasm: Continue Tizanidine as needed.12/15/2017 10.Hereditary and Idiopathic Peripheral Neuropathy:Continue  Topamax: Continue with Tens Unit. 12/15/2017 11. Physical Deconditioning: Awaiting on Physical Therapy  Consultation Appointment. 12/15/2017.  20 minutes of face to face patient care time was spent during this visit. All questions were encouraged and answered.   F/U in 61month

## 2017-12-17 ENCOUNTER — Ambulatory Visit (INDEPENDENT_AMBULATORY_CARE_PROVIDER_SITE_OTHER): Payer: Medicare Other

## 2017-12-17 DIAGNOSIS — J301 Allergic rhinitis due to pollen: Secondary | ICD-10-CM

## 2017-12-25 DIAGNOSIS — R109 Unspecified abdominal pain: Secondary | ICD-10-CM | POA: Diagnosis not present

## 2017-12-25 DIAGNOSIS — R1030 Lower abdominal pain, unspecified: Secondary | ICD-10-CM | POA: Diagnosis not present

## 2017-12-29 DIAGNOSIS — J069 Acute upper respiratory infection, unspecified: Secondary | ICD-10-CM | POA: Diagnosis not present

## 2017-12-29 DIAGNOSIS — R062 Wheezing: Secondary | ICD-10-CM | POA: Diagnosis not present

## 2017-12-29 DIAGNOSIS — K219 Gastro-esophageal reflux disease without esophagitis: Secondary | ICD-10-CM | POA: Diagnosis not present

## 2017-12-29 DIAGNOSIS — R42 Dizziness and giddiness: Secondary | ICD-10-CM | POA: Diagnosis not present

## 2017-12-29 MED FILL — HYDROCODON-APAP 10-325: 10-325 | 30 days supply | Qty: 90 | Fill #0

## 2017-12-31 ENCOUNTER — Ambulatory Visit (INDEPENDENT_AMBULATORY_CARE_PROVIDER_SITE_OTHER): Payer: Medicare Other

## 2017-12-31 DIAGNOSIS — J301 Allergic rhinitis due to pollen: Secondary | ICD-10-CM | POA: Diagnosis not present

## 2018-01-14 ENCOUNTER — Ambulatory Visit (INDEPENDENT_AMBULATORY_CARE_PROVIDER_SITE_OTHER): Payer: Medicare Other

## 2018-01-14 DIAGNOSIS — J301 Allergic rhinitis due to pollen: Secondary | ICD-10-CM

## 2018-01-19 ENCOUNTER — Encounter: Payer: Self-pay | Admitting: Neurology

## 2018-01-19 DIAGNOSIS — F0781 Postconcussional syndrome: Secondary | ICD-10-CM | POA: Diagnosis not present

## 2018-01-19 DIAGNOSIS — Z6839 Body mass index (BMI) 39.0-39.9, adult: Secondary | ICD-10-CM | POA: Diagnosis not present

## 2018-01-21 ENCOUNTER — Encounter: Payer: Medicare Other | Admitting: Registered Nurse

## 2018-01-28 ENCOUNTER — Encounter: Payer: Medicare Other | Attending: Physical Medicine & Rehabilitation | Admitting: Registered Nurse

## 2018-01-28 ENCOUNTER — Other Ambulatory Visit: Payer: Self-pay

## 2018-01-28 ENCOUNTER — Encounter: Payer: Self-pay | Admitting: Registered Nurse

## 2018-01-28 VITALS — BP 119/83 | HR 66 | Ht 63.0 in | Wt 235.6 lb

## 2018-01-28 DIAGNOSIS — Z5181 Encounter for therapeutic drug level monitoring: Secondary | ICD-10-CM | POA: Diagnosis not present

## 2018-01-28 DIAGNOSIS — R202 Paresthesia of skin: Secondary | ICD-10-CM | POA: Diagnosis not present

## 2018-01-28 DIAGNOSIS — F419 Anxiety disorder, unspecified: Secondary | ICD-10-CM | POA: Diagnosis not present

## 2018-01-28 DIAGNOSIS — M797 Fibromyalgia: Secondary | ICD-10-CM | POA: Insufficient documentation

## 2018-01-28 DIAGNOSIS — F3289 Other specified depressive episodes: Secondary | ICD-10-CM | POA: Diagnosis not present

## 2018-01-28 DIAGNOSIS — G44309 Post-traumatic headache, unspecified, not intractable: Secondary | ICD-10-CM | POA: Diagnosis not present

## 2018-01-28 DIAGNOSIS — G894 Chronic pain syndrome: Secondary | ICD-10-CM | POA: Diagnosis not present

## 2018-01-28 DIAGNOSIS — R200 Anesthesia of skin: Secondary | ICD-10-CM | POA: Diagnosis not present

## 2018-01-28 DIAGNOSIS — Z6841 Body Mass Index (BMI) 40.0 and over, adult: Secondary | ICD-10-CM | POA: Diagnosis not present

## 2018-01-28 DIAGNOSIS — M542 Cervicalgia: Secondary | ICD-10-CM | POA: Diagnosis not present

## 2018-01-28 DIAGNOSIS — Z79899 Other long term (current) drug therapy: Secondary | ICD-10-CM

## 2018-01-28 DIAGNOSIS — Z981 Arthrodesis status: Secondary | ICD-10-CM

## 2018-01-28 DIAGNOSIS — M79671 Pain in right foot: Secondary | ICD-10-CM

## 2018-01-28 DIAGNOSIS — M62838 Other muscle spasm: Secondary | ICD-10-CM

## 2018-01-28 DIAGNOSIS — R399 Unspecified symptoms and signs involving the genitourinary system: Secondary | ICD-10-CM | POA: Diagnosis not present

## 2018-01-28 DIAGNOSIS — R51 Headache: Secondary | ICD-10-CM | POA: Diagnosis not present

## 2018-01-28 DIAGNOSIS — M5412 Radiculopathy, cervical region: Secondary | ICD-10-CM | POA: Diagnosis not present

## 2018-01-28 DIAGNOSIS — R519 Headache, unspecified: Secondary | ICD-10-CM

## 2018-01-28 DIAGNOSIS — R42 Dizziness and giddiness: Secondary | ICD-10-CM | POA: Diagnosis not present

## 2018-01-28 DIAGNOSIS — M47816 Spondylosis without myelopathy or radiculopathy, lumbar region: Secondary | ICD-10-CM

## 2018-01-28 MED ORDER — HYDROCODONE-ACETAMINOPHEN 10-325 MG PO TABS: ORAL_TABLET | ORAL | 0 refills | Status: DC

## 2018-01-28 MED FILL — HYDROCODON-APAP 10-325: 10-325 | 30 days supply | Qty: 90 | Fill #0

## 2018-01-28 NOTE — Progress Notes (Signed)
Subjective:    Patient ID: Karina Greer, female    DOB: December 28, 1965, 52 y.o.   MRN: [id number]  HPI: Karina Greer is a 52 year old female who returns for follow up appointment for chronic pain and medication refill. She states her pain is located in her neck radiating into her left shoulder and mid-lower back pain. Also reports she has a headache. She rates her pain 7. Her current exercise regime is walking.   Karina Greer reports two weeks ago she was planting flowers and using an electric tiller, she bent down and lost control of the tiller. It hit her it the head. She reports she followed up with her PCP for the headaches, she has a call out to Dr. Vanetta Shawl, since the headaches has intensified she states.   Karina Greer Equivalent is 30.00 MME. She is also prescribed Alprazolam by Natasha Bence NP.We have discussed the black box warning of using opioids and benzodiazepines. I highlighted the dangers of using these drugs together and discussed the adverse events including respiratory suppression, overdose, cognitive impairment and importance of compliance with current regimen. We will continue to monitor and adjust as indicated.  She is being closely monitored and under the care of her psychiatrist Dr. Olivia Mackie  at the [facility]. .  Last Oral Swab was Performed on 10/20/2017, it was consistent.   Pain Inventory Average Pain 9 Pain Right Now 7 My pain is intermittent and tingling  In the last 24 hours, has pain interfered with the following? General activity 8 Relation with others 9 Enjoyment of life 10 What TIME of day is your pain at its worst? all Sleep (in general) Fair  Pain is worse with: walking, bending, sitting, inactivity, standing and some activites Pain improves with: rest, heat/ice, therapy/exercise, pacing activities, medication and TENS Relief from Meds: 5  Mobility walk without assistance how many minutes can you walk? 15 ability to climb steps?  yes do  you drive?  yes  Function disabled: date disabled n/a I need assistance with the following:  meal prep, household duties and shopping Do you have any goals in this area?  yes  Neuro/Psych weakness dizziness anxiety  Prior Studies Any changes since last visit?  no  Physicians involved in your care Primary care Dr. Vanetta Shawl   Family History  Problem Relation Age of Onset  . Hypertension Mother   . Diabetes Father   . Fibromyalgia Sister   . Hypertension Other        Cancer, Cerebrovascular disease run on mother side of family   Social History   Socioeconomic History  . Marital status: Divorced    Spouse name: Not on file  . Number of children: 3  . Years of education: HS  . Highest education level: Not on file  Occupational History  . Occupation: Merchandiser, retail: UNEMPLOYED    Comment: Disability  Social Needs  . Financial resource strain: Not on file  . Food insecurity:    Worry: Not on file    Inability: Not on file  . Transportation needs:    Medical: Not on file    Non-medical: Not on file  Tobacco Use  . Smoking status: Never Smoker  . Smokeless tobacco: Never Used  Substance and Sexual Activity  . Alcohol use: No  . Drug use: No  . Sexual activity: Not on file  Lifestyle  . Physical activity:    Days per week: Not on file  Minutes per session: Not on file  . Stress: Not on file  Relationships  . Social connections:    Talks on phone: Not on file    Gets together: Not on file    Attends religious service: Not on file    Active member of club or organization: Not on file    Attends meetings of clubs or organizations: Not on file    Relationship status: Not on file  Other Topics Concern  . Not on file  Social History Narrative  . Not on file   Past Surgical History:  Procedure Laterality Date  . ACHILLES TENDON SURGERY Right 3/16  . ANTERIOR CERVICAL DECOMP/DISCECTOMY FUSION N/A 03/11/2016   Procedure: C5-6, C6-7 Anterior Cervical  Discectomy and Fusion, Allograft, Plate;  Surgeon: Marybelle Killings, MD;  Location: [place];  Service: Orthopedics;  Laterality: N/A;  . CHOLECYSTECTOMY  2014  . COLONOSCOPY WITH PROPOFOL N/A 08/25/2017   Procedure: COLONOSCOPY WITH PROPOFOL;  Surgeon: Manya Silvas, MD;  Location: [facility] ENDOSCOPY;  Service: Endoscopy;  Laterality: N/A;  . CYSTO WITH HYDRODISTENSION N/A 01/26/2014   Procedure: CYSTOSCOPY/HYDRODISTENSION;  Surgeon: Bernestine Amass, MD;  Location: [facility];  Service: Urology;  Laterality: N/A;  . CYSTO/  URETHRAL DILATION/  HYDRODISTENTION/   INSTILLATION THERAPY  07-16-2010//   12-30-2007//   10-27-2006  . EXERCISE TOLERENCE TEST  10-12-2010   NEGATIVE  ADEQUATE ETT/  NO ISCHEMIA OR EVIDENCE HIGH GRADE OBSTRUCTIVE CAD/  NO FURTHER TEST NEEDED  . [place] ENDOMETRIAL ABLATION  2014  . LAPAROSCOPIC OVARIAN CYST BX  2005   AND  URETEROSCOPIC LASER LITHO  STONE EXTRACTION  . SHOULDER OPEN ROTATOR CUFF REPAIR Right 2004  . TONSILLECTOMY    . TRANSTHORACIC ECHOCARDIOGRAM  06-07-2006   normal study/  ef 60-65%  . TUBAL LIGATION Bilateral 1995   Past Medical History:  Diagnosis Date  . Anxiety   . Anxiety disorder   . Arthritis   . Chronic low back pain   . Depression   . Dyslipidemia   . Fibromyalgia   . GERD (gastroesophageal reflux disease)   . Headache    hx migraines  . History of kidney stones   . History of panic attacks   . History of renal calculi   . IBS (irritable bowel syndrome)   . Interstitial cystitis   . Lupus ([place])    tested positive for antibodies for lupus. dr to do further tests  . Menorrhagia   . OSA on CPAP    not used cpap for several weeks  . Pneumonia    hx  . PONV (postoperative nausea and vomiting)   . RLS (restless legs syndrome)   . Seasonal asthma   . SI (sacroiliac) joint dysfunction   . SUI (stress urinary incontinence, female)   . UTI (lower urinary tract infection)    hx  . White matter abnormality  on MRI of brain 02/23/2013   BP 119/83   Pulse 66   Ht 5\' 3"  (1.6 m) Comment: pt reported  Wt 235 lb 9.6 oz (106.9 kg)   SpO2 95%   BMI 41.73 kg/m   Opioid Risk Score:   Fall Risk Score:  `1  Depression screen PHQ 2/9  Depression screen [facility] 2/9 01/28/2018 12/15/2017 12/03/2017 10/20/2017 10/08/2017 09/19/2017 07/23/2017  Decreased Interest 1 2 0 2 2 1 1   Down, Depressed, Hopeless 1 2 0 2 2 1 1   PHQ - 2 Score 2 4 0 4  4 2 2   Altered sleeping - - - - 3 - -  Tired, decreased energy - - - - 3 - -  Change in appetite - - - - 3 - -  Feeling bad or failure about yourself  - - - - 1 - -  Trouble concentrating - - - - 2 - -  Moving slowly or fidgety/restless - - - - 0 - -  Suicidal thoughts - - - - 0 - -  PHQ-9 Score - - - - 16 - -  Difficult doing work/chores - - - - Somewhat difficult - -   Review of Systems  Constitutional: Negative.   HENT: Negative.   Eyes: Negative.   Respiratory: Negative.   Cardiovascular: Negative.   Gastrointestinal: Positive for nausea.  Endocrine: Negative.   Genitourinary: Negative.   Musculoskeletal: Negative.   Skin: Negative.   Allergic/Immunologic: Negative.   Neurological: Negative.   Hematological: Negative.   Psychiatric/Behavioral: Negative.   All other systems reviewed and are negative.      Objective:   Physical Exam  Constitutional: She is oriented to person, place, and time. She appears well-developed and well-nourished.  HENT:  Head: Normocephalic and atraumatic.  Neck: Normal range of motion. Neck supple.  Cervical Paraspinal Tenderness: C-5-C-6  Cardiovascular: Normal rate and regular rhythm.  Pulmonary/Chest: Effort normal and breath sounds normal.  Musculoskeletal:  Normal Muscle Bulk and Muscle Testing Reveals: Upper Extremities: Full ROM and Muscle Strength 5/5 Thoracic Paraspinal Tenderness: T-1-T-3 Lumbar Paraspinal Tenderness: L-3-L-5 Lower Extremities: Full ROM and Muscle Strength 5/5 Arises from Table with ease Narrow  Based Gait   Neurological: She is alert and oriented to person, place, and time.  Skin: Skin is warm and dry.  Nursing note and vitals reviewed.         Assessment & Plan:  1. Lumbago/ Lumbar Spondylosis: 01/28/2018 Refilled: HYDROcodone 10/325mg  one tablet every 8 hours as needed # 90 We will continue the opioid monitoring program, this consists of regular clinic visits, examinations, urine drug screen, pill counts as well as use of [place] Controlled Substance Reporting System. 2. Fibromyalgia. Continue Current exercise Regime. 01/28/2018 3. Anxiety and depression:Psychiatry Following: Dr. Olivia Mackie at the [facility]. Continue Counseling at [facility]. On Xanax, 01/28/2018 4. Migraines: On Maxalt. Neurology Following. 01/28/2018 5. OSA : Continue exercise regime as tolerated and losing weight. 01/28/2018 6. Obesity: Following Healthy Diet Regimen. 01/28/2018 7. Status Post Cervical Spinal Fusion: C5C6- C-6- C-7 Anterior Cervical Discectomy Fusion and Allograft Plate:Dr. Lorin Mercy Following. 01/28/2018 8. Cervicalgia/ Cervical Radiculitis/ S/P Cervical Spinal Fusion: Continue Topamax Dr. Lorin Mercy Following. 01/28/2018 9. Muscle Spasm: Continue Tizanidine as needed.01/28/2018 10.Hereditary and Idiopathic Peripheral Neuropathy:Continue  Topamax: Continue with Tens Unit. 01/28/2018 11. Frontal Headache: PCP Following Dr. Vanetta Shawl 20 minutes of face to face patient care time was spent during this visit. All questions were encouraged and answered.   F/U in 69month

## 2018-01-29 ENCOUNTER — Ambulatory Visit (INDEPENDENT_AMBULATORY_CARE_PROVIDER_SITE_OTHER): Payer: Medicare Other | Admitting: Internal Medicine

## 2018-01-29 ENCOUNTER — Encounter: Payer: Self-pay | Admitting: Internal Medicine

## 2018-01-29 DIAGNOSIS — J301 Allergic rhinitis due to pollen: Secondary | ICD-10-CM | POA: Diagnosis not present

## 2018-01-29 NOTE — Progress Notes (Signed)
[facility] [place], [place] [address]  Pulmonary Sleep Medicine   Office Visit Note  Patient Name: Karina Greer DOB: 11-06-1965 MRN [id number]  Date of Service: 01/29/2018  Complaints/HPI: Overall doing well.  She states that she was working in her garden apparently took a hit to the head with her till are she is following with primary care for this.  She was told that she may have a concussion.  She has got no neurological symptoms at this time.  Patient has had good response to allergy shots.  She has had no side effects related.  She denies having any sinus drainage.  She has little bit of itching of her off eyes which is worse when she goes to her mom's house where there is a dog.  The patient states that she has no wheezing no cough no congestion  ROS  General: (-) fever, (-) chills, (-) night sweats, (-) weakness Skin: (-) rashes, (-) itching,. Eyes: (-) visual changes, (-) redness, (-) itching. Nose and Sinuses: (-) nasal stuffiness or itchiness, (-) postnasal drip, (-) nosebleeds, (-) sinus trouble. Mouth and Throat: (-) sore throat, (-) hoarseness. Neck: (-) swollen glands, (-) enlarged thyroid, (-) neck pain. Respiratory: - cough, (-) bloody sputum, - shortness of breath, - wheezing. Cardiovascular: - ankle swelling, (-) chest pain. Lymphatic: (-) lymph node enlargement. Neurologic: (-) numbness, (-) tingling. Psychiatric: (-) anxiety, (-) depression   Current Medication: Outpatient Encounter Medications as of 01/29/2018  Medication Sig Note  . albuterol (PROVENTIL HFA;VENTOLIN HFA) 108 (90 BASE) MCG/ACT inhaler Inhale 2 puffs into the lungs every 6 (six) hours as needed.    [name] albuterol (PROVENTIL) (2.5 MG/3ML) 0.083% nebulizer solution INHALE 1 VIAL VIA NEBULIZER EVERY 6 HOURS AS NEEDED   . ALPRAZolam (XANAX) 1 MG tablet TAKE 1/2 TABLET BY MOUTH EVERY MORNING & TAKE 1 TABLET AT BEDTIME 10/20/2017: Has not taken in more than a week  . Azelastine  HCl 137 MCG/SPRAY SOLN Place 2 Squirts into the nose 2 (two) times daily.   [name] b complex vitamins tablet Take 1 tablet by mouth daily.   [name] DEXILANT 30 MG capsule Take 1 capsule by mouth 2 (two) times daily.   [name] dicyclomine (BENTYL) 10 MG capsule Take 10 mg by mouth 2 (two) times daily. As needed   . EPIPEN 2-PAK 0.3 MG/0.3ML SOAJ injection 0.3 mg See admin instructions.  03/11/2016: Never used medication  . fluticasone (FLONASE) 50 MCG/ACT nasal spray Place 1 spray into both nostrils daily as needed for allergies.    [name] HYDROcodone-acetaminophen (NORCO) 10-325 MG tablet TAKE 1 TABLET BY MOUTH EVERY 8 HOURS AS NEEDED FOR PAIN   . meclizine (ANTIVERT) 25 MG tablet Take 1 tablet by mouth 3 (three) times daily as needed.   . montelukast (SINGULAIR) 10 MG tablet Take 10 mg by mouth every morning.   . ondansetron (ZOFRAN-ODT) 4 MG disintegrating tablet Take 1 tablet by mouth 3 (three) times daily as needed.   [name] PRESCRIPTION MEDICATION every 14 (fourteen) days. 2 ALLERGY INJECTIONS EVERY 2 WKS   . rizatriptan (MAXALT) 10 MG tablet Take 1 tablet (10 mg total) by mouth 2 (two) times daily as needed. May repeat in 2 hours if needed   . tiZANidine (ZANAFLEX) 2 MG tablet Take 1 tablet (2 mg total) by mouth every 6 (six) hours as needed.   . topiramate (TOPAMAX) 25 MG tablet Take 1 tablet (25 mg total) by mouth at bedtime.   . Vitamin  D, Ergocalciferol, (DRISDOL) 50000 units CAPS capsule Take 50,000 Units by mouth every 7 (seven) days. THURSDAYS    No facility-administered encounter medications on file as of 01/29/2018.     Surgical History: Past Surgical History:  Procedure Laterality Date  . ACHILLES TENDON SURGERY Right 3/16  . ANTERIOR CERVICAL DECOMP/DISCECTOMY FUSION N/A 03/11/2016   Procedure: C5-6, C6-7 Anterior Cervical Discectomy and Fusion, Allograft, Plate;  Surgeon: Marybelle Killings, MD;  Location: [facility];  Service: Orthopedics;  Laterality: N/A;  . CHOLECYSTECTOMY  2014  . COLONOSCOPY WITH PROPOFOL  N/A 08/25/2017   Procedure: COLONOSCOPY WITH PROPOFOL;  Surgeon: Manya Silvas, MD;  Location: [facility] ENDOSCOPY;  Service: Endoscopy;  Laterality: N/A;  . CYSTO WITH HYDRODISTENSION N/A 01/26/2014   Procedure: CYSTOSCOPY/HYDRODISTENSION;  Surgeon: Bernestine Amass, MD;  Location: [facility];  Service: Urology;  Laterality: N/A;  . CYSTO/  URETHRAL DILATION/  HYDRODISTENTION/   INSTILLATION THERAPY  07-16-2010//   12-30-2007//   10-27-2006  . EXERCISE TOLERENCE TEST  10-12-2010   NEGATIVE  ADEQUATE ETT/  NO ISCHEMIA OR EVIDENCE HIGH GRADE OBSTRUCTIVE CAD/  NO FURTHER TEST NEEDED  . [facility] ENDOMETRIAL ABLATION  2014  . LAPAROSCOPIC OVARIAN CYST BX  2005   AND  URETEROSCOPIC LASER LITHO  STONE EXTRACTION  . SHOULDER OPEN ROTATOR CUFF REPAIR Right 2004  . TONSILLECTOMY    . TRANSTHORACIC ECHOCARDIOGRAM  06-07-2006   normal study/  ef 60-65%  . TUBAL LIGATION Bilateral 1995    Medical History: Past Medical History:  Diagnosis Date  . Anxiety   . Anxiety disorder   . Arthritis   . Chronic low back pain   . Depression   . Dyslipidemia   . Fibromyalgia   . GERD (gastroesophageal reflux disease)   . Headache    hx migraines  . History of kidney stones   . History of panic attacks   . History of renal calculi   . IBS (irritable bowel syndrome)   . Interstitial cystitis   . Lupus ([place])    tested positive for antibodies for lupus. dr to do further tests  . Menorrhagia   . OSA on CPAP    not used cpap for several weeks  . Pneumonia    hx  . PONV (postoperative nausea and vomiting)   . RLS (restless legs syndrome)   . Seasonal asthma   . SI (sacroiliac) joint dysfunction   . SUI (stress urinary incontinence, female)   . UTI (lower urinary tract infection)    hx  . 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 02/23/2013    Family History: Family History  Problem Relation Age of Onset  . Hypertension Mother   . Diabetes Father   . Fibromyalgia Sister    . Hypertension Other        Cancer, Cerebrovascular disease run on mother side of family    Social History: Social History   Socioeconomic History  . Marital status: Divorced    Spouse name: Not on file  . Number of children: 3  . Years of education: HS  . Highest education level: Not on file  Occupational History  . Occupation: Merchandiser, retail: UNEMPLOYED    Comment: Disability  Social Needs  . Financial resource strain: Not on file  . Food insecurity:    Worry: Not on file    Inability: Not on file  . Transportation needs:    Medical: Not on file  Non-medical: Not on file  Tobacco Use  . Smoking status: Never Smoker  . Smokeless tobacco: Never Used  Substance and Sexual Activity  . Alcohol use: No  . Drug use: No  . Sexual activity: Not on file  Lifestyle  . Physical activity:    Days per week: Not on file    Minutes per session: Not on file  . Stress: Not on file  Relationships  . Social connections:    Talks on phone: Not on file    Gets together: Not on file    Attends religious service: Not on file    Active member of club or organization: Not on file    Attends meetings of clubs or organizations: Not on file    Relationship status: Not on file  . Intimate partner violence:    Fear of current or ex partner: Not on file    Emotionally abused: Not on file    Physically abused: Not on file    Forced sexual activity: Not on file  Other Topics Concern  . Not on file  Social History Narrative  . Not on file    Vital Signs: Blood pressure 130/80, pulse 66, resp. rate 16, height 5\' 3"  (1.6 m), weight 236 lb (107 kg), SpO2 97 %.  Examination: General Appearance: The patient is well-developed, well-nourished, and in no distress. Skin: Gross inspection of skin unremarkable. Head: normocephalic, no gross deformities. Eyes: no gross deformities noted. ENT: ears appear grossly normal no exudates. Neck: Supple. No thyromegaly. No LAD. Respiratory:  no rhonchi noted. Cardiovascular: Normal S1 and S2 without murmur or rub. Extremities: No cyanosis. pulses are equal. Neurologic: Alert and oriented. No involuntary movements.  LABS: No results found for this or any previous visit (from the past 2160 hour(s)).  Radiology: No results found.  No results found.  No results found.    Assessment and Plan: Patient Active Problem List   Diagnosis Date Noted  . LLQ pain 12/03/2017  . Pre-diabetes 06/03/2017  . Rectal bleeding 06/02/2017  . S/P cervical spinal fusion 03/11/2016  . Bilateral hand pain 11/29/2015  . Elevated C-reactive protein 11/29/2015  . Elevated rheumatoid factor 11/29/2015  . Raynaud's phenomenon without gangrene 11/29/2015  . Bloating 09/12/2014  . Dizziness 08/08/2014  . Peri-menopause 08/08/2014  . Chronic tension-type headache, intractable 07/13/2014  . Gastroesophageal reflux disease without esophagitis 07/12/2014  . Anxiety 04/18/2014  . Chronic arthritis 04/18/2014  . Cystitis 04/18/2014  . Morbid obesity ([place]) 04/18/2014  . Fibromyalgia 04/18/2014  . 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 Arthropathy 04/18/2014  . Cephalalgia 04/04/2014  . Cervical pain 04/04/2014  . Numbness and tingling 04/04/2014  . 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 Nonspecific abnormal findings on radiological and other examination of skull and head 02/23/2013  . Other nonspecific abnormal result of function study of brain and central nervous system 07/01/2012  . Disturbance of skin sensation 07/01/2012  . Migraine without aura 07/01/2012  . Abdominal pain 02/11/2012  . Chronic fatigue syndrome 02/11/2012  . Chronic interstitial cystitis 02/11/2012  . Dyspareunia 02/11/2012  . Dysuria 02/11/2012  . Female stress incontinence 02/11/2012  . Irritable bowel syndrome with diarrhea 02/11/2012  . Nausea and vomiting 02/11/2012  . Nocturia 02/11/2012   . Urge incontinence 02/11/2012  . Urinary urgency 02/11/2012  . Lumbar spondylosis 02/04/2012  . Myalgia and myositis 02/04/2012  . Depression 02/04/2012  . Lumbosacral spondylosis without myelopathy 02/04/2012  . OBESITY,  UNSPECIFIED 10/03/2010  . GENERALIZED ANXIETY DISORDER 10/03/2010  . CHEST PAIN, PRECORDIAL 10/03/2010    1. Allergic Rhinitis continue with allergy shots today patient will seen for ongoing dosage.  Will continue supportive care and monitor 2. OSA right now she is doing well CPAP device this will continued on pressure settings.  She will follow up with her sleep appointments as ordered.  We will continue with supportive care 3. GERD stable at this time continue with present medication 4. Morbid obesity once again she needs to work on weight loss with diet and exercise  General Counseling: I have discussed the findings of the evaluation and examination with Danial.  I have also discussed any further diagnostic evaluation thatmay be needed or ordered today. Addisson verbalizes understanding of the findings of todays visit. We also reviewed her medications today and discussed drug interactions and side effects including but not limited excessive drowsiness and altered mental states. We also discussed that there is always a risk not just to her but also people around her. she has been encouraged to call the office with any questions or concerns that should arise related to todays visit.    Time spent: 43min  I have personally obtained a history, examined the patient, evaluated laboratory and imaging results, formulated the assessment and plan and placed orders.    Allyne Gee, MD [facility] Pulmonary and Critical Care Sleep medicine

## 2018-02-06 ENCOUNTER — Other Ambulatory Visit: Payer: Self-pay | Admitting: Family Medicine

## 2018-02-06 DIAGNOSIS — R42 Dizziness and giddiness: Secondary | ICD-10-CM

## 2018-02-09 DIAGNOSIS — J069 Acute upper respiratory infection, unspecified: Secondary | ICD-10-CM | POA: Diagnosis not present

## 2018-02-09 DIAGNOSIS — Z6841 Body Mass Index (BMI) 40.0 and over, adult: Secondary | ICD-10-CM | POA: Diagnosis not present

## 2018-02-12 ENCOUNTER — Ambulatory Visit: Payer: Self-pay

## 2018-02-14 ENCOUNTER — Ambulatory Visit
Admission: RE | Admit: 2018-02-14 | Discharge: 2018-02-14 | Disposition: A | Discharge status: Home / Self Care | Payer: Medicare Other | Source: Ambulatory Visit | Attending: Family Medicine | Admitting: Family Medicine

## 2018-02-14 DIAGNOSIS — R42 Dizziness and giddiness: Secondary | ICD-10-CM | POA: Diagnosis not present

## 2018-02-14 DIAGNOSIS — R9089 Other abnormal findings on diagnostic imaging of central nervous system: Secondary | ICD-10-CM | POA: Diagnosis not present

## 2018-02-17 ENCOUNTER — Ambulatory Visit (INDEPENDENT_AMBULATORY_CARE_PROVIDER_SITE_OTHER): Payer: Medicare Other | Admitting: Orthopaedic Surgery

## 2018-02-19 DIAGNOSIS — M25561 Pain in right knee: Secondary | ICD-10-CM | POA: Diagnosis not present

## 2018-02-25 ENCOUNTER — Other Ambulatory Visit: Payer: Self-pay | Admitting: Internal Medicine

## 2018-02-25 ENCOUNTER — Encounter: Payer: Medicare Other | Attending: Physical Medicine & Rehabilitation | Admitting: Registered Nurse

## 2018-02-25 ENCOUNTER — Ambulatory Visit (INDEPENDENT_AMBULATORY_CARE_PROVIDER_SITE_OTHER): Payer: Medicare Other

## 2018-02-25 ENCOUNTER — Encounter: Payer: Self-pay | Admitting: Registered Nurse

## 2018-02-25 VITALS — BP 139/93 | HR 83 | Resp 14 | Ht 63.0 in | Wt 234.0 lb

## 2018-02-25 DIAGNOSIS — R202 Paresthesia of skin: Secondary | ICD-10-CM | POA: Diagnosis not present

## 2018-02-25 DIAGNOSIS — J301 Allergic rhinitis due to pollen: Secondary | ICD-10-CM | POA: Diagnosis not present

## 2018-02-25 DIAGNOSIS — F419 Anxiety disorder, unspecified: Secondary | ICD-10-CM

## 2018-02-25 DIAGNOSIS — M47816 Spondylosis without myelopathy or radiculopathy, lumbar region: Secondary | ICD-10-CM

## 2018-02-25 DIAGNOSIS — G609 Hereditary and idiopathic neuropathy, unspecified: Secondary | ICD-10-CM

## 2018-02-25 DIAGNOSIS — M797 Fibromyalgia: Secondary | ICD-10-CM | POA: Diagnosis not present

## 2018-02-25 DIAGNOSIS — R200 Anesthesia of skin: Secondary | ICD-10-CM | POA: Diagnosis not present

## 2018-02-25 DIAGNOSIS — M542 Cervicalgia: Secondary | ICD-10-CM | POA: Diagnosis not present

## 2018-02-25 DIAGNOSIS — F3289 Other specified depressive episodes: Secondary | ICD-10-CM | POA: Diagnosis not present

## 2018-02-25 DIAGNOSIS — M62838 Other muscle spasm: Secondary | ICD-10-CM | POA: Diagnosis not present

## 2018-02-25 DIAGNOSIS — M5416 Radiculopathy, lumbar region: Secondary | ICD-10-CM

## 2018-02-25 DIAGNOSIS — M5412 Radiculopathy, cervical region: Secondary | ICD-10-CM

## 2018-02-25 DIAGNOSIS — M7061 Trochanteric bursitis, right hip: Secondary | ICD-10-CM | POA: Diagnosis not present

## 2018-02-25 DIAGNOSIS — Z981 Arthrodesis status: Secondary | ICD-10-CM | POA: Diagnosis not present

## 2018-02-25 MED ORDER — EPIPEN 2-PAK 0.3 MG/0.3ML IJ SOAJ: INTRAMUSCULAR | 0 refills | Status: DC

## 2018-02-25 MED ORDER — HYDROCODONE-ACETAMINOPHEN 10-325 MG PO TABS: ORAL_TABLET | ORAL | 0 refills | Status: DC

## 2018-02-25 MED FILL — HYDROCODON-APAP 10-325: 10-325 | 30 days supply | Qty: 90 | Fill #0

## 2018-02-25 NOTE — Progress Notes (Signed)
Subjective:    Patient ID: Karina Greer, female    DOB: Mar 19, 1966, 52 y.o.   MRN: [id number] HPI: Karina Greer is a 52 year old female who returns for follow up appointment for chronic pain and medication refill. She states her pain is located in her neck radiating into her right shoulder, lower back pain, right hip and right foot.  She rates her pain 6. Her current exercise regime is walking and performing stretching exercises.   Karina Greer Morphine Equivalent is 30.00 MME. She is also prescribed Alprazolam  by Natasha Bence NP.We have discussed the black box warning of using opioids and benzodiazepines. I highlighted the dangers of using these drugs together and discussed the adverse events including respiratory suppression, overdose, cognitive impairment and importance of compliance with current regimen. We will continue to monitor and adjust as indicated.  She is being closely monitored and under the care of her psychiatrist Dr[facility].   Last Oral Swab was Performed on 10/20/2017, it was consistent.   Pain Inventory Average Pain 7 Pain Right Now 6 My pain is constant, sharp, stabbing and tingling  In the last 24 hours, has pain interfered with the following? General activity 6 Relation with others 7 Enjoyment of life 6 What TIME of day is your pain at its worst? all Sleep (in general) Fair  Pain is worse with: walking, bending, sitting, inactivity, standing and some activites Pain improves with: rest, heat/ice, therapy/exercise, pacing activities, medication and TENS Relief from Meds: 7  Mobility walk without assistance how many minutes can you walk? 10 ability to climb steps?  yes do you drive?  yes Do you have any goals in this area?  yes  Function disabled: date disabled . I need assistance with the following:  meal prep, household duties and shopping Do you have any goals in this area?  yes  Neuro/Psych bladder control  problems numbness trouble walking dizziness anxiety  Prior Studies Any changes since last visit?  no  Physicians involved in your care Any changes since last visit?  no   Family History  Problem Relation Age of Onset  . Hypertension Mother   . Diabetes Father   . Fibromyalgia Sister   . Hypertension Other        Cancer, Cerebrovascular disease run on mother side of family   Social History   Socioeconomic History  . Marital status: Divorced    Spouse name: Not on file  . Number of children: 3  . Years of education: HS  . Highest education level: Not on file  Occupational History  . Occupation: Merchandiser, retail: UNEMPLOYED    Comment: Disability  Social Needs  . Financial resource strain: Not on file  . Food insecurity:    Worry: Not on file    Inability: Not on file  . Transportation needs:    Medical: Not on file    Non-medical: Not on file  Tobacco Use  . Smoking status: Never Smoker  . Smokeless tobacco: Never Used  Substance and Sexual Activity  . Alcohol use: No  . Drug use: No  . Sexual activity: Not on file  Lifestyle  . Physical activity:    Days per week: Not on file    Minutes per session: Not on file  . Stress: Not on file  Relationships  . Social connections:    Talks on phone: Not on file    Gets together: Not on  file    Attends religious service: Not on file    Active member of club or organization: Not on file    Attends meetings of clubs or organizations: Not on file    Relationship status: Not on file  Other Topics Concern  . Not on file  Social History Narrative  . Not on file   Past Surgical History:  Procedure Laterality Date  . ACHILLES TENDON SURGERY Right 3/16  . ANTERIOR CERVICAL DECOMP/DISCECTOMY FUSION N/A 03/11/2016   Procedure: C5-6, C6-7 Anterior Cervical Discectomy and Fusion, Allograft, Plate;  Surgeon: Marybelle Killings, MD;  Location: [facility];  Service: Orthopedics;  Laterality: N/A;  . CHOLECYSTECTOMY  2014  .  COLONOSCOPY WITH PROPOFOL N/A 08/25/2017   Procedure: COLONOSCOPY WITH PROPOFOL;  Surgeon: Manya Silvas, MD;  [facility] ENDOSCOPY;  Service: Endoscopy;  Laterality: N/A;  . CYSTO WITH HYDRODISTENSION N/A 01/26/2014   Procedure: CYSTOSCOPY/HYDRODISTENSION;  Surgeon: Bernestine Amass, MD;  Location: [facility];  Service: Urology;  Laterality: N/A;  . CYSTO/  URETHRAL DILATION/  HYDRODISTENTION/   INSTILLATION THERAPY  07-16-2010//   12-30-2007//   10-27-2006  . EXERCISE TOLERENCE TEST  10-12-2010   NEGATIVE  ADEQUATE ETT/  NO ISCHEMIA OR EVIDENCE HIGH GRADE OBSTRUCTIVE CAD/  NO FURTHER TEST NEEDED  . [place] ENDOMETRIAL ABLATION  2014  . LAPAROSCOPIC OVARIAN CYST BX  2005   AND  URETEROSCOPIC LASER LITHO  STONE EXTRACTION  . SHOULDER OPEN ROTATOR CUFF REPAIR Right 2004  . TONSILLECTOMY    . TRANSTHORACIC ECHOCARDIOGRAM  06-07-2006   normal study/  ef 60-65%  . TUBAL LIGATION Bilateral 1995   Past Medical History:  Diagnosis Date  . Anxiety   . Anxiety disorder   . Arthritis   . Chronic low back pain   . Depression   . Dyslipidemia   . Fibromyalgia   . GERD (gastroesophageal reflux disease)   . Headache    hx migraines  . History of kidney stones   . History of panic attacks   . History of renal calculi   . IBS (irritable bowel syndrome)   . Interstitial cystitis   . Lupus ([place])    tested positive for antibodies for lupus. dr to do further tests  . Menorrhagia   . OSA on CPAP    not used cpap for several weeks  . Pneumonia    hx  . PONV (postoperative nausea and vomiting)   . RLS (restless legs syndrome)   . Seasonal asthma   . SI (sacroiliac) joint dysfunction   . SUI (stress urinary incontinence, female)   . UTI (lower urinary tract infection)    hx  . 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 02/23/2013   BP (!) 139/93 (BP Location: Left Arm, Patient Position: Sitting, Cuff Size: Large)   Pulse 83   Resp 14   Ht 5\' 3"  (1.6 m)    Wt 234 lb (106.1 kg)   SpO2 96%   BMI 41.45 kg/m   Opioid Risk Score:   Fall Risk Score:  `1  Depression screen PHQ 2/9  Depression screen [facility] 2/9 01/29/2018 01/28/2018 12/15/2017 12/03/2017 10/20/2017 10/08/2017 09/19/2017  Decreased Interest 0 1 2 0 2 2 1   Down, Depressed, Hopeless 0 1 2 0 2 2 1   PHQ - 2 Score 0 2 4 0 4 4 2   Altered sleeping - - - - - 3 -  Tired, decreased energy - - - - - 3 -  Change in appetite - - - - - 3 -  Feeling bad or failure about yourself  - - - - - 1 -  Trouble concentrating - - - - - 2 -  Moving slowly or fidgety/restless - - - - - 0 -  Suicidal thoughts - - - - - 0 -  PHQ-9 Score - - - - - 16 -  Difficult doing work/chores - - - - - Somewhat difficult -    Review of Systems  Constitutional: Positive for appetite change.  HENT: Negative.   Eyes: Negative.   Respiratory: Positive for apnea.   Cardiovascular: Negative.   Gastrointestinal: Positive for abdominal pain and nausea.  Endocrine: Negative.   Musculoskeletal: Positive for arthralgias, back pain, gait problem, myalgias, neck pain and neck stiffness.  Skin: Negative.   Allergic/Immunologic: Negative.   Neurological: Positive for dizziness.  Psychiatric/Behavioral: The patient is nervous/anxious.        Objective:   Physical Exam  Constitutional: She is oriented to person, place, and time. She appears well-developed and well-nourished.  HENT:  Head: Normocephalic and atraumatic.  Neck: Normal range of motion. Neck supple.  Cervical Paraspinal Tenderness: C-5-C-6  Cardiovascular: Normal rate and regular rhythm.  Pulmonary/Chest: Effort normal and breath sounds normal.  Musculoskeletal:  Normal Muscle Bulk and Muscle Testing Reveals:  Upper Extremities: Full ROM and Muscle Strength 5/5 Bilateral AC Joint Tenderness Thoracic Paraspinal Tenderness: T-1-T-3 Lumbar Paraspinal Tenderness: L-3-l-5 Right Greater Trochanter Tenderness Lower Extremities: Full ROM and Muscle Strength 5/5 Arises  from chair with ease Narrow Based Gait  Neurological: She is alert and oriented to person, place, and time.  Skin: Skin is warm and dry.  Psychiatric: She has a normal mood and affect.  Nursing note and vitals reviewed.         Assessment & Plan:  1. Lumbago/ Lumbar Spondylosis: 02/25/2018 Refilled: HYDROcodone 10/325mg  one tablet every8hours as needed # 90 We will continue the opioid monitoring program, this consists of regular clinic visits, examinations, urine drug screen, pill counts as well as use of [place] Controlled Substance Reporting System. 2. Fibromyalgia. Continue Current exercise Regime. 02/25/2018 3. Anxiety and depression:Psychiatry Following: Dr. Olivia Mackie at the [facility]. Continue Counseling at [facility]. On Xanax, 02/25/2018 4. Migraines: On Maxalt. Neurology Following. 02/25/2018 5. OSA : Continue exercise regime as tolerated and losing weight. 02/25/2018 6. Obesity: Following Healthy Diet Regimen. 02/25/2018 7. Status Post Cervical Spinal Fusion: C5C6- C-6- C-7 Anterior Cervical Discectomy Fusion and Allograft Plate:Dr. Lorin Mercy Following. 02/25/2018 8. Cervicalgia/ Cervical Radiculitis/ S/P Cervical Spinal Fusion: Continue Topamax Dr. Lorin Mercy Following. 02/25/2018 9. Muscle Spasm: Continue Tizanidine as needed.02/25/2018 10.Hereditary and Idiopathic Peripheral Neuropathy:Continue Topamax: Continue with Tens Unit. 02/25/2018  20 minutes of face to face patient care time was spent during this visit. All questions were encouraged and answered.   F/U in 14month

## 2018-03-08 ENCOUNTER — Other Ambulatory Visit: Payer: Self-pay | Admitting: Registered Nurse

## 2018-03-08 ENCOUNTER — Other Ambulatory Visit: Payer: Self-pay | Admitting: Internal Medicine

## 2018-03-10 ENCOUNTER — Ambulatory Visit: Payer: Medicare Other

## 2018-03-11 DIAGNOSIS — J301 Allergic rhinitis due to pollen: Secondary | ICD-10-CM | POA: Diagnosis not present

## 2018-03-12 DIAGNOSIS — F332 Major depressive disorder, recurrent severe without psychotic features: Secondary | ICD-10-CM | POA: Diagnosis not present

## 2018-03-12 DIAGNOSIS — F411 Generalized anxiety disorder: Secondary | ICD-10-CM | POA: Diagnosis not present

## 2018-03-17 ENCOUNTER — Ambulatory Visit (INDEPENDENT_AMBULATORY_CARE_PROVIDER_SITE_OTHER): Payer: Medicare Other

## 2018-03-17 ENCOUNTER — Other Ambulatory Visit: Payer: Self-pay | Admitting: Nurse Practitioner

## 2018-03-17 DIAGNOSIS — F411 Generalized anxiety disorder: Secondary | ICD-10-CM | POA: Diagnosis not present

## 2018-03-17 DIAGNOSIS — J301 Allergic rhinitis due to pollen: Secondary | ICD-10-CM

## 2018-03-17 DIAGNOSIS — F3181 Bipolar II disorder: Secondary | ICD-10-CM | POA: Diagnosis not present

## 2018-03-17 MED ORDER — EPIPEN 2-PAK 0.3 MG/0.3ML IJ SOAJ: INTRAMUSCULAR | 1 refills | Status: DC

## 2018-03-19 ENCOUNTER — Encounter: Payer: Self-pay | Admitting: Registered Nurse

## 2018-03-19 ENCOUNTER — Encounter (HOSPITAL_BASED_OUTPATIENT_CLINIC_OR_DEPARTMENT_OTHER): Payer: Medicare Other | Admitting: Registered Nurse

## 2018-03-19 VITALS — BP 134/87 | HR 71 | Resp 14 | Ht 63.0 in | Wt 235.0 lb

## 2018-03-19 DIAGNOSIS — M542 Cervicalgia: Secondary | ICD-10-CM | POA: Diagnosis not present

## 2018-03-19 DIAGNOSIS — R200 Anesthesia of skin: Secondary | ICD-10-CM | POA: Diagnosis not present

## 2018-03-19 DIAGNOSIS — F419 Anxiety disorder, unspecified: Secondary | ICD-10-CM

## 2018-03-19 DIAGNOSIS — M62838 Other muscle spasm: Secondary | ICD-10-CM | POA: Diagnosis not present

## 2018-03-19 DIAGNOSIS — G609 Hereditary and idiopathic neuropathy, unspecified: Secondary | ICD-10-CM | POA: Diagnosis not present

## 2018-03-19 DIAGNOSIS — M5412 Radiculopathy, cervical region: Secondary | ICD-10-CM | POA: Diagnosis not present

## 2018-03-19 DIAGNOSIS — Z981 Arthrodesis status: Secondary | ICD-10-CM | POA: Diagnosis not present

## 2018-03-19 DIAGNOSIS — F332 Major depressive disorder, recurrent severe without psychotic features: Secondary | ICD-10-CM | POA: Diagnosis not present

## 2018-03-19 DIAGNOSIS — M7061 Trochanteric bursitis, right hip: Secondary | ICD-10-CM | POA: Diagnosis not present

## 2018-03-19 DIAGNOSIS — M797 Fibromyalgia: Secondary | ICD-10-CM | POA: Diagnosis not present

## 2018-03-19 DIAGNOSIS — M47816 Spondylosis without myelopathy or radiculopathy, lumbar region: Secondary | ICD-10-CM | POA: Diagnosis not present

## 2018-03-19 DIAGNOSIS — M5416 Radiculopathy, lumbar region: Secondary | ICD-10-CM | POA: Diagnosis not present

## 2018-03-19 DIAGNOSIS — F411 Generalized anxiety disorder: Secondary | ICD-10-CM | POA: Diagnosis not present

## 2018-03-19 DIAGNOSIS — R202 Paresthesia of skin: Secondary | ICD-10-CM | POA: Diagnosis not present

## 2018-03-19 MED ORDER — HYDROCODONE-ACETAMINOPHEN 10-325 MG PO TABS: ORAL_TABLET | ORAL | 0 refills | Status: DC

## 2018-03-19 NOTE — Progress Notes (Signed)
Subjective:    Patient ID: Karina Greer, female    DOB: August 31, 1966, 52 y.o.   MRN: [id number]  HPI: Karina Greer is a 52 year old female who returns for follow up appointment for chronic pain and medication refill. She states her pain is located in her neck radiating into her bilateral shoulders, lower back radiating into her right hip and right lower extremity and right ankle pain. She rates her pain 8. Her current exercise regime is walking and walking on treadmill for 10 minutes daily.   Karina Greer Morphine Equivalent is 30.00 MME.She is also prescribed Alprazolam  by Natasha Bence NP, last prescription picked up on 12/29/2017 .We have discussed the black box warning of using opioids and benzodiazepines. I highlighted the dangers of using these drugs together and discussed the adverse events including respiratory suppression, overdose, cognitive impairment and importance of compliance with current regimen. We will continue to monitor and adjust as indicated.   Last Oral Swab was Performed on 10/20/2017.  Pain Inventory Average Pain 9 Pain Right Now 8 My pain is constant, sharp and stabbing  In the last 24 hours, has pain interfered with the following? General activity 6 Relation with others 7 Enjoyment of life 7 What TIME of day is your pain at its worst? all Sleep (in general) Fair  Pain is worse with: walking, bending, sitting, inactivity, standing and some activites Pain improves with: rest, heat/ice, therapy/exercise, pacing activities, medication and TENS Relief from Meds: 7  Mobility walk without assistance how many minutes can you walk? 15 ability to climb steps?  yes do you drive?  yes Do you have any goals in this area?  yes  Function disabled: date disabled . I need assistance with the following:  meal prep, household duties and shopping Do you have any goals in this area?  yes  Neuro/Psych dizziness anxiety  Prior Studies Any changes since last  visit?  no  Physicians involved in your care Any changes since last visit?  no   Family History  Problem Relation Age of Onset  . Hypertension Mother   . Diabetes Father   . Fibromyalgia Sister   . Hypertension Other        Cancer, Cerebrovascular disease run on mother side of family   Social History   Socioeconomic History  . Marital status: Divorced    Spouse name: Not on file  . Number of children: 3  . Years of education: HS  . Highest education level: Not on file  Occupational History  . Occupation: Merchandiser, retail: UNEMPLOYED    Comment: Disability  Social Needs  . Financial resource strain: Not on file  . Food insecurity:    Worry: Not on file    Inability: Not on file  . Transportation needs:    Medical: Not on file    Non-medical: Not on file  Tobacco Use  . Smoking status: Never Smoker  . Smokeless tobacco: Never Used  Substance and Sexual Activity  . Alcohol use: No  . Drug use: No  . Sexual activity: Not on file  Lifestyle  . Physical activity:    Days per week: Not on file    Minutes per session: Not on file  . Stress: Not on file  Relationships  . Social connections:    Talks on phone: Not on file    Gets together: Not on file    Attends religious service: Not on file  Active member of club or organization: Not on file    Attends meetings of clubs or organizations: Not on file    Relationship status: Not on file  Other Topics Concern  . Not on file  Social History Narrative  . Not on file   Past Surgical History:  Procedure Laterality Date  . ACHILLES TENDON SURGERY Right 3/16  . ANTERIOR CERVICAL DECOMP/DISCECTOMY FUSION N/A 03/11/2016   Procedure: C5-6, C6-7 Anterior Cervical Discectomy and Fusion, Allograft, Plate;  Surgeon: Marybelle Killings, MD;  Location: [place];  Service: Orthopedics;  Laterality: N/A;  . CHOLECYSTECTOMY  2014  . COLONOSCOPY WITH PROPOFOL N/A 08/25/2017   Procedure: COLONOSCOPY WITH PROPOFOL;  Surgeon: Manya Silvas, MD;  Location: [facility] ENDOSCOPY;  Service: Endoscopy;  Laterality: N/A;  . CYSTO WITH HYDRODISTENSION N/A 01/26/2014   Procedure: CYSTOSCOPY/HYDRODISTENSION;  Surgeon: Bernestine Amass, MD;  Location: [facility];  Service: Urology;  Laterality: N/A;  . CYSTO/  URETHRAL DILATION/  HYDRODISTENTION/   INSTILLATION THERAPY  07-16-2010//   12-30-2007//   10-27-2006  . EXERCISE TOLERENCE TEST  10-12-2010   NEGATIVE  ADEQUATE ETT/  NO ISCHEMIA OR EVIDENCE HIGH GRADE OBSTRUCTIVE CAD/  NO FURTHER TEST NEEDED  . [place] ENDOMETRIAL ABLATION  2014  . LAPAROSCOPIC OVARIAN CYST BX  2005   AND  URETEROSCOPIC LASER LITHO  STONE EXTRACTION  . SHOULDER OPEN ROTATOR CUFF REPAIR Right 2004  . TONSILLECTOMY    . TRANSTHORACIC ECHOCARDIOGRAM  06-07-2006   normal study/  ef 60-65%  . TUBAL LIGATION Bilateral 1995   Past Medical History:  Diagnosis Date  . Anxiety   . Anxiety disorder   . Arthritis   . Chronic low back pain   . Depression   . Dyslipidemia   . Fibromyalgia   . GERD (gastroesophageal reflux disease)   . Headache    hx migraines  . History of kidney stones   . History of panic attacks   . History of renal calculi   . IBS (irritable bowel syndrome)   . Interstitial cystitis   . Lupus ([place])    tested positive for antibodies for lupus. dr to do further tests  . Menorrhagia   . OSA on CPAP    not used cpap for several weeks  . Pneumonia    hx  . PONV (postoperative nausea and vomiting)   . RLS (restless legs syndrome)   . Seasonal asthma   . SI (sacroiliac) joint dysfunction   . SUI (stress urinary incontinence, female)   . UTI (lower urinary tract infection)    hx  . 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 02/23/2013   BP 134/87 (BP Location: Left Arm, Patient Position: Sitting, Cuff Size: Normal)   Pulse 71   Resp 14   Ht 5\' 3"  (1.6 m)   Wt 235 lb (106.6 kg)   SpO2 95%   BMI 41.63 kg/m   Opioid Risk Score:   Fall Risk Score:   `1  Depression screen PHQ 2/9  Depression screen [facility] 2/9 01/29/2018 01/28/2018 12/15/2017 12/03/2017 10/20/2017 10/08/2017 09/19/2017  Decreased Interest 0 1 2 0 2 2 1   Down, Depressed, Hopeless 0 1 2 0 2 2 1   PHQ - 2 Score 0 2 4 0 4 4 2   Altered sleeping - - - - - 3 -  Tired, decreased energy - - - - - 3 -  Change in appetite - - - - - 3 -  Feeling  bad or failure about yourself  - - - - - 1 -  Trouble concentrating - - - - - 2 -  Moving slowly or fidgety/restless - - - - - 0 -  Suicidal thoughts - - - - - 0 -  PHQ-9 Score - - - - - 16 -  Difficult doing work/chores - - - - - Somewhat difficult -    Review of Systems  Constitutional: Negative.   HENT: Negative.   Eyes: Negative.   Respiratory: Negative.   Cardiovascular: Negative.   Gastrointestinal: Negative.   Endocrine: Negative.   Genitourinary: Negative.   Musculoskeletal: Positive for arthralgias, back pain, myalgias and neck pain.  Skin: Negative.   Allergic/Immunologic: Negative.   Neurological: Positive for dizziness.  Hematological: Negative.   Psychiatric/Behavioral: The patient is nervous/anxious.        Objective:   Physical Exam  Constitutional: She is oriented to person, place, and time. She appears well-developed and well-nourished.  HENT:  Head: Normocephalic and atraumatic.  Neck: Normal range of motion. Neck supple.  Cervical Paraspinal Tenderness: C-5-C-6  Cardiovascular: Normal rate and regular rhythm.  Pulmonary/Chest: Effort normal and breath sounds normal.  Musculoskeletal:  Normal Muscle Bulk and Muscle Testing Reveals: Upper Extremities: Full ROM and Muscle Strength 5/5 Bilateral AC Joint Tenderness Thoracic Paraspinal Tenderness: T-7-T-9 Lumbar Paraspinal Tenderness: L-3-L-5 Lower Extremities: Full ROM and Muscle Strength 5/5 Arises from chair with ease Narrow Based Gait   Neurological: She is alert and oriented to person, place, and time.  Skin: Skin is warm and dry.  Psychiatric: She has  a normal mood and affect. Her behavior is normal.  Nursing note and vitals reviewed.         Assessment & Plan:  1. Lumbago/ Lumbar Spondylosis: 03/19/2018 Refilled: HYDROcodone 10/325mg  one tablet every 6 hours as needed #120.  We will continue the opioid monitoring program, this consists of regular clinic visits, examinations, urine drug screen, pill counts as well as use of [place] Controlled Substance Reporting System. 2. Fibromyalgia. Continue Current exercise Regime. 03/19/2018 3. Anxiety and depression:Psychiatry Following: Dr. Olivia Mackie at [facility]. Continue Counseling at [facility]. On Xanax, 03/19/2018 4. Migraines: On Maxalt. Neurology Following. 03/19/2018 5. OSA : Continue exercise regime as tolerated and losing weight. 03/19/2018 6. Obesity: Following Healthy Diet Regimen. 03/19/2018 7. Status Post Cervical Spinal Fusion: C5C6- C-6- C-7 Anterior Cervical Discectomy Fusion and Allograft Plate:Dr. Lorin Mercy Following. 03/19/2018 8. Cervicalgia/ Cervical Radiculitis/ S/P Cervical Spinal Fusion: Continue Topamax Dr. Lorin Mercy Following. 03/19/2018 9. Muscle Spasm: Continue Tizanidine as needed.03/19/2018 10.Hereditary and Idiopathic Peripheral Neuropathy:Continue  Topamax: Continue with [facility]. 03/19/2018  20 minutes of face to face patient care time was spent during this visit. All questions were encouraged and answered.   F/U in 5month

## 2018-03-24 ENCOUNTER — Encounter: Payer: Medicare Other | Admitting: Registered Nurse

## 2018-03-30 ENCOUNTER — Ambulatory Visit (INDEPENDENT_AMBULATORY_CARE_PROVIDER_SITE_OTHER): Payer: Medicare Other

## 2018-03-30 ENCOUNTER — Encounter: Payer: Self-pay | Admitting: Neurology

## 2018-03-30 ENCOUNTER — Ambulatory Visit (INDEPENDENT_AMBULATORY_CARE_PROVIDER_SITE_OTHER): Payer: Medicare Other | Admitting: Neurology

## 2018-03-30 VITALS — BP 136/82 | HR 72 | Ht 63.0 in | Wt 233.0 lb

## 2018-03-30 DIAGNOSIS — H9203 Otalgia, bilateral: Secondary | ICD-10-CM | POA: Diagnosis not present

## 2018-03-30 DIAGNOSIS — R42 Dizziness and giddiness: Secondary | ICD-10-CM | POA: Diagnosis not present

## 2018-03-30 DIAGNOSIS — R110 Nausea: Secondary | ICD-10-CM

## 2018-03-30 DIAGNOSIS — J301 Allergic rhinitis due to pollen: Secondary | ICD-10-CM | POA: Diagnosis not present

## 2018-03-30 MED FILL — HYDROCODON-APAP 10-325: 10-325 | 30 days supply | Qty: 90 | Fill #0

## 2018-03-30 NOTE — Patient Instructions (Signed)
As the dizziness and nausea is constant, I don't think it is migraine.  However, you can restart the topiramate and take it daily (at night time) to see if it improves.

## 2018-03-30 NOTE — Progress Notes (Signed)
NEUROLOGY CONSULTATION NOTE  Karina Greer MRN: [id number] DOB: 07-12-1966  Referring provider: Brett Fairy, PA-C Primary care provider: Cyndi Bender, PA-C  Reason for consult:  Otalgia, dizziness and nausea  HISTORY OF PRESENT ILLNESS: Karina Greer is a 52 year old right-handed female with OSA, depression, anxiety, IBS, fibromyalgia, chronic neck pain status post ACDF C5-6 and C6-7,  who presents for otalgia but also wishes to discuss dizziness and nausea.  History supplemented by referring provider's note.  She has a complex medical history.  She said she developed multiple health problems after contracting [place] spotted fever in 2000.  She has fibromyalgia, IBS, OSA, osteoarthritis, allergies, autoimmune disease (possibly lupus) and neuropathy symptoms (paresthesias).  She receives allergy shots every 2 weeks for severe seasonal allergies as well as to mold and pet dander.  Dizziness and Nausea: She has had paresthesias, neck pain, dizziness and nausea for several years.  At first, the dizziness and nausea were episodic, occurring every 2 days.  She describes the dizziness as feeling off-balance and lightheaded, like she is going to pass out.  It is accompanied by visual disturbance of fuzzy white spots in her visual field of both eyes as well as perioral numbness.  There is no associated headache, vomiting or unilateral numbness or weakness.  It would last anywhere from 2 hours to an entire day.  They are aggravated by movement and feeling any buzzing sensation (such as a massager on her feet or back).  Cold showers help relieve it.  Since last year, they dizziness and nausea have been constant.  She treats it with meclizine and ondansetron.  Last week, [place] was in [place] and her dizziness and nausea resolved.  However, they returned when she returned home.  She remote history of classic migraines years ago.  She still occasionally has tension-type headache.  Her  son has hemiplegic migraines.  Otalgia: She does have a history of neck pain.  MRI of cervical spine from 12/03/15 was personally reviewed and demonstrated central disc protrusion at C5-6 causing mild to moderate canal stenosis as well as degenerative spondylolysis at C4-5 and C6-7 with mild canal stenosis.  C2-3 was negative.  She underwent C5-7 ACDF.  Due to continued pain, she had a CT of the cervical spine on 03/25/17, personally reviewed, and demonstrated postsurgical ACDF but no new abnormality.  Cervical X-ray from 05/30/17 showed no acute changes.  Over the past year, she developed bilateral ear pain, worse in the left ear.  CT of the neck with contrast from 11/13/17 showed C5-C7 ACDF with moderate degenerative spurring at C4-5, but no mass lesions or acute abnormalities.  MRI of brain without contrast from 02/14/18 was personally reviewed and demonstrated nonspecific subcortical and periventricular white matter changes.  PAST MEDICAL HISTORY: Past Medical History:  Diagnosis Date  . Anxiety   . Anxiety disorder   . Arthritis   . Chronic low back pain   . Depression   . Dyslipidemia   . Fibromyalgia   . GERD (gastroesophageal reflux disease)   . Headache    hx migraines  . History of kidney stones   . History of panic attacks   . History of renal calculi   . IBS (irritable bowel syndrome)   . Interstitial cystitis   . Lupus ([place])    tested positive for antibodies for lupus. dr to do further tests  . Menorrhagia   . OSA on CPAP    not used cpap for  several weeks  . Pneumonia    hx  . PONV (postoperative nausea and vomiting)   . RLS (restless legs syndrome)   . Seasonal asthma   . SI (sacroiliac) joint dysfunction   . SUI (stress urinary incontinence, female)   . UTI (lower urinary tract infection)    hx  . 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 02/23/2013    PAST SURGICAL HISTORY: Past Surgical History:  Procedure Laterality Date  . ACHILLES TENDON SURGERY Right 3/16  .  ANTERIOR CERVICAL DECOMP/DISCECTOMY FUSION N/A 03/11/2016   Procedure: C5-6, C6-7 Anterior Cervical Discectomy and Fusion, Allograft, Plate;  Surgeon: Marybelle Killings, MD;  Location: [place];  Service: Orthopedics;  Laterality: N/A;  . CHOLECYSTECTOMY  2014  . COLONOSCOPY WITH PROPOFOL N/A 08/25/2017   Procedure: COLONOSCOPY WITH PROPOFOL;  Surgeon: Manya Silvas, MD;  Location: [facility] ENDOSCOPY;  Service: Endoscopy;  Laterality: N/A;  . CYSTO WITH HYDRODISTENSION N/A 01/26/2014   Procedure: CYSTOSCOPY/HYDRODISTENSION;  Surgeon: Bernestine Amass, MD;  Location: [facility];  Service: Urology;  Laterality: N/A;  . CYSTO/  URETHRAL DILATION/  HYDRODISTENTION/   INSTILLATION THERAPY  07-16-2010//   12-30-2007//   10-27-2006  . EXERCISE TOLERENCE TEST  10-12-2010   NEGATIVE  ADEQUATE ETT/  NO ISCHEMIA OR EVIDENCE HIGH GRADE OBSTRUCTIVE CAD/  NO FURTHER TEST NEEDED  . [place] ENDOMETRIAL ABLATION  2014  . LAPAROSCOPIC OVARIAN CYST BX  2005   AND  URETEROSCOPIC LASER LITHO  STONE EXTRACTION  . SHOULDER OPEN ROTATOR CUFF REPAIR Right 2004  . TONSILLECTOMY    . TRANSTHORACIC ECHOCARDIOGRAM  06-07-2006   normal study/  ef 60-65%  . TUBAL LIGATION Bilateral 1995    MEDICATIONS: Current Outpatient Medications on File Prior to Visit  Medication Sig Dispense Refill  . albuterol (PROVENTIL HFA;VENTOLIN HFA) 108 (90 BASE) MCG/ACT inhaler Inhale 2 puffs into the lungs every 6 (six) hours as needed.     [name] albuterol (PROVENTIL) (2.5 MG/3ML) 0.083% nebulizer solution INHALE 1 VIAL VIA NEBULIZER EVERY 6 HOURS AS NEEDED  1  . ALPRAZolam (XANAX) 1 MG tablet TAKE 1/2 TABLET BY MOUTH EVERY MORNING & TAKE 1 TABLET AT BEDTIME  2  . azelastine (ASTELIN) 0.1 % nasal spray INSTILL 2 SPRAYS INTO THE NOSE 2 TIMES DAILY 30 mL 3  . b complex vitamins tablet Take 1 tablet by mouth daily.    . busPIRone (BUSPAR) 5 MG tablet Take 5 mg by mouth 3 (three) times daily.    [name] DEXILANT 30 MG capsule Take  1 capsule by mouth 2 (two) times daily.    [name] dicyclomine (BENTYL) 10 MG capsule Take 10 mg by mouth 2 (two) times daily. As needed    . EPIPEN 2-PAK 0.3 MG/0.3ML SOAJ injection Use as directed 1 Device 1  . FLOVENT HFA 110 MCG/ACT inhaler INHALE 1 PUFF BY MOUTH TWICE A DAY 12 Inhaler 2  . HYDROcodone-acetaminophen (NORCO) 10-325 MG tablet TAKE 1 TABLET BY MOUTH EVERY 8 HOURS AS NEEDED FOR PAIN 90 tablet 0  . meclizine (ANTIVERT) 25 MG tablet Take 1 tablet by mouth 3 (three) times daily as needed.    . montelukast (SINGULAIR) 10 MG tablet Take 10 mg by mouth every morning.    . ondansetron (ZOFRAN-ODT) 4 MG disintegrating tablet Take 1 tablet by mouth 3 (three) times daily as needed.    [name] PRESCRIPTION MEDICATION every 14 (fourteen) days. 2 ALLERGY INJECTIONS EVERY 2 WKS    . tiZANidine (ZANAFLEX)  2 MG tablet Take 1 tablet (2 mg total) by mouth every 6 (six) hours as needed. 90 tablet 3  . topiramate (TOPAMAX) 25 MG tablet TAKE 1 TABLET BY MOUTH EVERYDAY AT BEDTIME 30 tablet 2  . Vitamin D, Ergocalciferol, (DRISDOL) 50000 units CAPS capsule Take 50,000 Units by mouth every 7 (seven) days. THURSDAYS     No current facility-administered medications on file prior to visit.     ALLERGIES: Allergies  Allergen Reactions  . Alendronate Sodium Anaphylaxis    Closes throat up  . Milnacipran Hcl Other (See Comments)    Makes patient bleed "from everywhere."  . Elavil [Amitriptyline Hcl] Other (See Comments)    Hallucinations   . Tramadol Palpitations and Other (See Comments)    Tachycardia   . Codeine Sulfate Other (See Comments)  . Cymbalta [Duloxetine Hcl] Other (See Comments)    Cannot remember   . Duloxetine Other (See Comments)    Facial swelling  . Lamotrigine   . Morphine Other (See Comments)    Caused anxiety attack pt felt like she was going to have a heart attack Tachycardia per pt  . Nsaids Other (See Comments)    Upset stomach Upset stomach  . Other     bandaid  . Skelaxin  Other (See Comments)    Cannot remember  . Tessalon [Benzonatate] Other (See Comments)    "FEELS LIKE I'M CHOKING"  . Oxycodone Itching and Rash    SEVERE ITCHING  . Oxycodone-Acetaminophen Palpitations  . Tetracycline Rash    FAMILY HISTORY: Family History  Problem Relation Age of Onset  . Hypertension Mother   . Diabetes Father   . Brain cancer Father   . Fibromyalgia Sister   . Suicidality Sister   . Hypertension Other        Cancer, Cerebrovascular disease run on mother side of family    SOCIAL HISTORY: Social History   Socioeconomic History  . Marital status: Divorced    Spouse name: Not on file  . Number of children: 3  . Years of education: HS  . Highest education level: 12th grade  Occupational History  . Occupation: Merchandiser, retail: UNEMPLOYED    Comment: Disability  Social Needs  . Financial resource strain: Not on file  . Food insecurity:    Worry: Not on file    Inability: Not on file  . Transportation needs:    Medical: Not on file    Non-medical: Not on file  Tobacco Use  . Smoking status: Never Smoker  . Smokeless tobacco: Never Used  Substance and Sexual Activity  . Alcohol use: No  . Drug use: No  . Sexual activity: Not on file  Lifestyle  . Physical activity:    Days per week: Not on file    Minutes per session: Not on file  . Stress: Not on file  Relationships  . Social connections:    Talks on phone: Not on file    Gets together: Not on file    Attends religious service: Not on file    Active member of club or organization: Not on file    Attends meetings of clubs or organizations: Not on file    Relationship status: Not on file  . Intimate partner violence:    Fear of current or ex partner: Not on file    Emotionally abused: Not on file    Physically abused: Not on file    Forced sexual activity: Not on file  Other Topics Concern  . Not on file  Social History Narrative   Patient is right-handed. She avoids caffeine.  She has recently been using the treadmill.    REVIEW OF SYSTEMS: Constitutional: No fevers, chills, or sweats, no generalized fatigue, change in appetite Eyes: No visual changes, double vision, eye pain Ear, nose and throat: No hearing loss, ear pain, nasal congestion, sore throat Cardiovascular: No chest pain, palpitations Respiratory:  No shortness of breath at rest or with exertion, wheezes GastrointestinaI: njausea Genitourinary:  No dysuria, urinary retention or frequency Musculoskeletal:  Neck pain, back pain Integumentary: No rash, pruritus, skin lesions Neurological: as above Psychiatric: No depression, insomnia, anxiety Endocrine: No palpitations, fatigue, diaphoresis, mood swings, change in appetite, change in weight, increased thirst Hematologic/Lymphatic:  No purpura, petechiae. Allergic/Immunologic: no itchy/runny eyes, nasal congestion, recent allergic reactions, rashes  PHYSICAL EXAM: Vitals:   03/30/18 0755  BP: 136/82  Pulse: 72  SpO2: 97%   General: No acute distress.  Patient appears well-groomed.  Head:  Normocephalic/atraumatic Eyes:  fundi examined but not visualized Neck: supple, no paraspinal tenderness, full range of motion Back: No paraspinal tenderness Heart: regular rate and rhythm Lungs: Clear to auscultation bilaterally. Vascular: No carotid bruits. Neurological Exam: Mental status: alert and oriented to person, place, and time, recent and remote memory intact, fund of knowledge intact, attention and concentration intact, speech fluent and not dysarthric, language intact. Cranial nerves: CN I: not tested CN II: pupils equal, round and reactive to light, visual fields intact CN III, IV, VI:  full range of motion, no nystagmus, no ptosis CN V: facial sensation intact CN VII: upper and lower face symmetric CN VIII: hearing intact CN IX, X: gag intact, uvula midline CN XI: sternocleidomastoid and trapezius muscles intact CN XII: tongue  midline Bulk & Tone: normal, no fasciculations. Motor:  5/5 throughout  Sensation: temperature and vibration sensation intact. Deep Tendon Reflexes:  2+ throughout, toes downgoing.  Finger to nose testing:  Without dysmetria.  Heel to shin:  Without dysmetria.  Gait:  Normal station and stride.  Able to turn and tandem walk. Romberg negative.  IMPRESSION: 1.  Chronic dizziness and nausea.  While she has history of migraines, I don't believe her current dizziness and nausea is migraine as symptoms are constant.  She previously was on topiramate for neuropathy.  She may want to try restarting it to see if the dizziness improves. 2.  Otalgia.  I have no neurologic explanation for her otalgia.  MRI of brain did not reveal any brainstem or cranial nerve abnormalities, imaging of the cervical spine revealed no abnormality at C2-3.    Thank you for allowing me to take part in the care of this patient.  Metta Clines, DO  CC:  Brett Fairy, PA-C   [place] [place], [place]

## 2018-04-06 ENCOUNTER — Ambulatory Visit: Payer: Self-pay | Admitting: Internal Medicine

## 2018-04-06 DIAGNOSIS — F411 Generalized anxiety disorder: Secondary | ICD-10-CM | POA: Diagnosis not present

## 2018-04-06 DIAGNOSIS — F332 Major depressive disorder, recurrent severe without psychotic features: Secondary | ICD-10-CM | POA: Diagnosis not present

## 2018-04-10 DIAGNOSIS — N3010 Interstitial cystitis (chronic) without hematuria: Secondary | ICD-10-CM | POA: Diagnosis not present

## 2018-04-10 DIAGNOSIS — R300 Dysuria: Secondary | ICD-10-CM | POA: Diagnosis not present

## 2018-04-14 DIAGNOSIS — M797 Fibromyalgia: Secondary | ICD-10-CM | POA: Diagnosis not present

## 2018-04-14 DIAGNOSIS — J019 Acute sinusitis, unspecified: Secondary | ICD-10-CM | POA: Diagnosis not present

## 2018-04-17 ENCOUNTER — Encounter: Payer: Self-pay | Admitting: Registered Nurse

## 2018-04-17 ENCOUNTER — Encounter: Payer: Medicare Other | Attending: Physical Medicine & Rehabilitation | Admitting: Registered Nurse

## 2018-04-17 VITALS — BP 128/80 | HR 77 | Ht 63.0 in | Wt 234.0 lb

## 2018-04-17 DIAGNOSIS — M542 Cervicalgia: Secondary | ICD-10-CM | POA: Diagnosis not present

## 2018-04-17 DIAGNOSIS — M5416 Radiculopathy, lumbar region: Secondary | ICD-10-CM

## 2018-04-17 DIAGNOSIS — G894 Chronic pain syndrome: Secondary | ICD-10-CM | POA: Diagnosis not present

## 2018-04-17 DIAGNOSIS — M7061 Trochanteric bursitis, right hip: Secondary | ICD-10-CM

## 2018-04-17 DIAGNOSIS — R200 Anesthesia of skin: Secondary | ICD-10-CM | POA: Diagnosis not present

## 2018-04-17 DIAGNOSIS — G609 Hereditary and idiopathic neuropathy, unspecified: Secondary | ICD-10-CM

## 2018-04-17 DIAGNOSIS — M797 Fibromyalgia: Secondary | ICD-10-CM | POA: Insufficient documentation

## 2018-04-17 DIAGNOSIS — M62838 Other muscle spasm: Secondary | ICD-10-CM | POA: Diagnosis not present

## 2018-04-17 DIAGNOSIS — M5412 Radiculopathy, cervical region: Secondary | ICD-10-CM

## 2018-04-17 DIAGNOSIS — F419 Anxiety disorder, unspecified: Secondary | ICD-10-CM

## 2018-04-17 DIAGNOSIS — R202 Paresthesia of skin: Secondary | ICD-10-CM | POA: Diagnosis not present

## 2018-04-17 DIAGNOSIS — M7062 Trochanteric bursitis, left hip: Secondary | ICD-10-CM

## 2018-04-17 DIAGNOSIS — M47816 Spondylosis without myelopathy or radiculopathy, lumbar region: Secondary | ICD-10-CM

## 2018-04-17 DIAGNOSIS — Z981 Arthrodesis status: Secondary | ICD-10-CM | POA: Diagnosis not present

## 2018-04-17 DIAGNOSIS — Z79899 Other long term (current) drug therapy: Secondary | ICD-10-CM

## 2018-04-17 DIAGNOSIS — Z5181 Encounter for therapeutic drug level monitoring: Secondary | ICD-10-CM

## 2018-04-17 MED ORDER — HYDROCODONE-ACETAMINOPHEN 10-325 MG PO TABS: ORAL_TABLET | ORAL | 0 refills | Status: DC

## 2018-04-17 NOTE — Progress Notes (Signed)
Subjective:    Patient ID: Karina Greer, female    DOB: [birth year], 52 y.o.   MRN: [id number]  HPI: [name] is a 52 year old female who returns for follow up appointment for chronic pain and medication refill. She states her pain is located in her neck radiating into her left shoulders, mid- lower back radiating into her bilateral hips. She rates her pain 8. Her current exercise regime is walking.   Ms. [place] Morphine Equivalent is 30.00 MME. She is also prescribed Alprazolam by Natasha Bence NP. Marland KitchenWe have discussed the black box warning of using opioids and benzodiazepines. I highlighted the dangers of using these drugs together and discussed the adverse events including respiratory suppression, overdose, cognitive impairment and importance of compliance with current regimen. We will continue to monitor and adjust as indicated.   She is being closely monitored and under the care of her psychiatrist at the [facility].   Pain Inventory Average Pain 9 Pain Right Now 8 My pain is constant and sharp  In the last 24 hours, has pain interfered with the following? General activity 8 Relation with others 9 Enjoyment of life 7 What TIME of day is your pain at its worst? all Sleep (in general) Fair  Pain is worse with: walking, bending, sitting, inactivity, standing, unsure and some activites Pain improves with: rest, heat/ice, therapy/exercise, pacing activities, medication and TENS Relief from Meds: 6  Mobility ability to climb steps?  yes do you drive?  yes  Function disabled: date disabled .  Neuro/Psych anxiety  Prior Studies Any changes since last visit?  no  Physicians involved in your care Any changes since last visit?  no   Family History  Problem Relation Age of Onset  . Hypertension Mother   . Diabetes Father   . Brain cancer Father   . Fibromyalgia Sister   . Suicidality Sister   . Hypertension Other        Cancer, Cerebrovascular disease run on  mother side of family   Social History   Socioeconomic History  . Marital status: Divorced    Spouse name: Not on file  . Number of children: 3  . Years of education: HS  . Highest education level: 12th grade  Occupational History  . Occupation: Merchandiser, retail: UNEMPLOYED    Comment: Disability  Social Needs  . Financial resource strain: Not on file  . Food insecurity:    Worry: Not on file    Inability: Not on file  . Transportation needs:    Medical: Not on file    Non-medical: Not on file  Tobacco Use  . Smoking status: Never Smoker  . Smokeless tobacco: Never Used  Substance and Sexual Activity  . Alcohol use: No  . Drug use: No  . Sexual activity: Not on file  Lifestyle  . Physical activity:    Days per week: Not on file    Minutes per session: Not on file  . Stress: Not on file  Relationships  . Social connections:    Talks on phone: Not on file    Gets together: Not on file    Attends religious service: Not on file    Active member of club or organization: Not on file    Attends meetings of clubs or organizations: Not on file    Relationship status: Not on file  Other Topics Concern  . Not on file  Social History Narrative   Patient  is right-handed. She avoids caffeine. She has recently been using the treadmill.   Past Surgical History:  Procedure Laterality Date  . ACHILLES TENDON SURGERY Right 3/16  . ANTERIOR CERVICAL DECOMP/DISCECTOMY FUSION N/A 03/11/2016   Procedure: C5-6, C6-7 Anterior Cervical Discectomy and Fusion, Allograft, Plate;  Surgeon: Marybelle Killings, MD;  Location: [facility];  Service: Orthopedics;  Laterality: N/A;  . CHOLECYSTECTOMY  2014  . COLONOSCOPY WITH PROPOFOL N/A 08/25/2017   Procedure: COLONOSCOPY WITH PROPOFOL;  Surgeon: Manya Silvas, MD;  Location: [facility] ENDOSCOPY;  Service: Endoscopy;  Laterality: N/A;  . CYSTO WITH HYDRODISTENSION N/A 01/26/2014   Procedure: CYSTOSCOPY/HYDRODISTENSION;  Surgeon: Bernestine Amass, MD;   Location: [facility];  Service: Urology;  Laterality: N/A;  . CYSTO/  URETHRAL DILATION/  HYDRODISTENTION/   INSTILLATION THERAPY  07-16-2010//   12-30-2007//   10-27-2006  . EXERCISE TOLERENCE TEST  10-12-2010   NEGATIVE  ADEQUATE ETT/  NO ISCHEMIA OR EVIDENCE HIGH GRADE OBSTRUCTIVE CAD/  NO FURTHER TEST NEEDED  . [place] ENDOMETRIAL ABLATION  2014  . LAPAROSCOPIC OVARIAN CYST BX  2005   AND  URETEROSCOPIC LASER LITHO  STONE EXTRACTION  . SHOULDER OPEN ROTATOR CUFF REPAIR Right 2004  . TONSILLECTOMY    . TRANSTHORACIC ECHOCARDIOGRAM  06-07-2006   normal study/  ef 60-65%  . TUBAL LIGATION Bilateral 1995   Past Medical History:  Diagnosis Date  . Anxiety   . Anxiety disorder   . Arthritis   . Chronic low back pain   . Depression   . Dyslipidemia   . Fibromyalgia   . GERD (gastroesophageal reflux disease)   . Headache    hx migraines  . History of kidney stones   . History of panic attacks   . History of renal calculi   . IBS (irritable bowel syndrome)   . Interstitial cystitis   . Lupus ([place])    tested positive for antibodies for lupus. dr to do further tests  . Menorrhagia   . OSA on CPAP    not used cpap for several weeks  . Pneumonia    hx  . PONV (postoperative nausea and vomiting)   . RLS (restless legs syndrome)   . Seasonal asthma   . SI (sacroiliac) joint dysfunction   . SUI (stress urinary incontinence, female)   . UTI (lower urinary tract infection)    hx  . 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 02/23/2013   BP 128/80   Pulse 77   Ht 5\' 3"  (1.6 m)   Wt 234 lb (106.1 kg)   SpO2 97%   BMI 41.45 kg/m   Opioid Risk Score:   Fall Risk Score:  `1  Depression screen PHQ 2/9  Depression screen [facility] 2/9 01/29/2018 01/28/2018 12/15/2017 12/03/2017 10/20/2017 10/08/2017 09/19/2017  Decreased Interest 0 1 2 0 2 2 1   Down, Depressed, Hopeless 0 1 2 0 2 2 1   PHQ - 2 Score 0 2 4 0 4 4 2   Altered sleeping - - - - - 3 -  Tired,  decreased energy - - - - - 3 -  Change in appetite - - - - - 3 -  Feeling bad or failure about yourself  - - - - - 1 -  Trouble concentrating - - - - - 2 -  Moving slowly or fidgety/restless - - - - - 0 -  Suicidal thoughts - - - - - 0 -  PHQ-9 Score - - - - -  16 -  Difficult doing work/chores - - - - - Somewhat difficult -  Some recent data might be hidden    Review of Systems  Constitutional: Positive for chills and fever.  HENT: Positive for congestion, sinus pressure and sneezing.   Eyes: Negative.   Respiratory: Negative.   Cardiovascular: Negative.   Gastrointestinal: Negative.   Endocrine: Negative.   Genitourinary: Negative.   Musculoskeletal: Positive for arthralgias, back pain, myalgias and neck pain.  Skin: Negative.   Allergic/Immunologic: Negative.   Neurological: Negative.   Hematological: Negative.   Psychiatric/Behavioral: Negative.        Objective:   Physical Exam  Constitutional: She is oriented to person, place, and time. She appears well-developed and well-nourished.  HENT:  Head: Normocephalic and atraumatic.  Neck: Normal range of motion. Neck supple.  Cervical Paraspinal Tenderness: C-5-C-6  Cardiovascular: Normal rate and regular rhythm.  Pulmonary/Chest: Effort normal and breath sounds normal.  Musculoskeletal:  Normal Muscle Bulk and Muscle Testing Reveals: Upper Extremities: Full ROM and Muscle Strength 5/5 Left AC Joint Tenderness Thoracic and Lumbar Hypersensitivity Lower Extremities: Full ROM and Muscle Strength 5/5 Left Lower Extremity Flexion Produces Pain into Lumbar Right Lower Extremity Flexion Produces Pain into Right Hip Arises from Table Slowly Antalgic gait  Neurological: She is alert and oriented to person, place, and time.  Skin: Skin is warm and dry.  Psychiatric: She has a normal mood and affect. Her behavior is normal.  Nursing note and vitals reviewed.         Assessment & Plan:  1. Lumbago/ Lumbar Spondylosis/  Right Lumbar Radiculitis: Continue to Monitor. 04/17/2018 Refilled: HYDROcodone 10/325mg  one tablet every 6 hours as needed #120.  We will continue the opioid monitoring program, this consists of regular clinic visits, examinations, urine drug screen, pill counts as well as use of [place] Controlled Substance Reporting System. 2. Fibromyalgia. Continue Current exercise Regime.04/17/2018 3. Anxiety and depression:Psychiatry Following: Dr.Jessica at the [facility]. Continue Counseling at [facility]. On Xanax,04/17/2018 4. Migraines: On Maxalt. Neurology Following.04/17/2018 5. OSA : Continue exercise regime as tolerated and losing weight.04/17/2018 6. Obesity: Following Healthy Diet Regimen.04/17/2018 7. Status Post Cervical Spinal Fusion: C5C6- C-6- C-7 Anterior Cervical Discectomy Fusion and Allograft Plate:Dr. Lorin Mercy Following.04/17/2018 8. Cervicalgia/ Cervical Radiculitis/ S/P Cervical Spinal Fusion: Continue to Monitor Dr. Lorin Mercy Following. 04/17/2018 9. Muscle Spasm: Continue Tizanidine as needed.04/17/2018 10.Hereditary and Idiopathic Peripheral Neuropathy Continue with Tens Unit. 04/17/2018 11. Bilateral Greater Trochanteric Bursitis: Continue to Alternate Ice and eat Therapy. Continue to Monitor. 04/17/2018.   20 minutes of face to face patient care time was spent during this visit. All questions were encouraged and answered.   F/U in 75month

## 2018-04-20 ENCOUNTER — Ambulatory Visit (INDEPENDENT_AMBULATORY_CARE_PROVIDER_SITE_OTHER): Payer: Medicare Other | Admitting: Internal Medicine

## 2018-04-20 ENCOUNTER — Ambulatory Visit (INDEPENDENT_AMBULATORY_CARE_PROVIDER_SITE_OTHER): Payer: Medicare Other

## 2018-04-20 ENCOUNTER — Encounter: Payer: Self-pay | Admitting: Internal Medicine

## 2018-04-20 VITALS — BP 118/78 | HR 73 | Resp 16 | Ht 63.0 in | Wt 237.0 lb

## 2018-04-20 DIAGNOSIS — Z23 Encounter for immunization: Secondary | ICD-10-CM

## 2018-04-20 DIAGNOSIS — J301 Allergic rhinitis due to pollen: Secondary | ICD-10-CM

## 2018-04-20 DIAGNOSIS — G4733 Obstructive sleep apnea (adult) (pediatric): Secondary | ICD-10-CM

## 2018-04-20 DIAGNOSIS — K219 Gastro-esophageal reflux disease without esophagitis: Secondary | ICD-10-CM | POA: Diagnosis not present

## 2018-04-20 DIAGNOSIS — Z9989 Dependence on other enabling machines and devices: Secondary | ICD-10-CM | POA: Diagnosis not present

## 2018-04-20 MED ORDER — PNEUMOCOCCAL 13-VAL CONJ VACC IM SUSP: 0.5000 mL | INTRAMUSCULAR | 0 refills | Status: AC

## 2018-04-20 NOTE — Progress Notes (Signed)
[facility] [place], [place] [address]  Pulmonary Sleep Medicine   Office Visit Note  Patient Name: Karina Greer DOB: Nov 17, 1965 MRN [id number]  Date of Service: 04/20/2018  Complaints/HPI: Pt here for follow up for allergies.  Overall she is doing well.  She is currently on Ceftin for a possible sinus infection.  She reports having 2 more days of antibiotics.  She is here today for her allergy shot and reports feeling much better since starting the antibiotics.  She denies sinus drainage, pain, pressure.    ROS  General: (-) fever, (-) chills, (-) night sweats, (-) weakness Skin: (-) rashes, (-) itching,. Eyes: (-) visual changes, (-) redness, (-) itching. Nose and Sinuses: (-) nasal stuffiness or itchiness, (-) postnasal drip, (-) nosebleeds, (-) sinus trouble. Mouth and Throat: (-) sore throat, (-) hoarseness. Neck: (-) swollen glands, (-) enlarged thyroid, (-) neck pain. Respiratory: - cough, (-) bloody sputum, + shortness of breath, - wheezing. Cardiovascular: - ankle swelling, (-) chest pain. Lymphatic: (-) lymph node enlargement. Neurologic: (-) numbness, (-) tingling. Psychiatric: (-) anxiety, (-) depression   Current Medication: Outpatient Encounter Medications as of 04/20/2018  Medication Sig Note  . albuterol (PROVENTIL HFA;VENTOLIN HFA) 108 (90 BASE) MCG/ACT inhaler Inhale 2 puffs into the lungs every 6 (six) hours as needed.    [name] albuterol (PROVENTIL) (2.5 MG/3ML) 0.083% nebulizer solution INHALE 1 VIAL VIA NEBULIZER EVERY 6 HOURS AS NEEDED   . ALPRAZolam (XANAX) 1 MG tablet TAKE 1/2 TABLET BY MOUTH EVERY MORNING & TAKE 1 TABLET AT BEDTIME 02/25/2018: LD: 02/22/2018  . azelastine (ASTELIN) 0.1 % nasal spray INSTILL 2 SPRAYS INTO THE NOSE 2 TIMES DAILY   . b complex vitamins tablet Take 1 tablet by mouth daily.   . cefUROXime (CEFTIN) 500 MG tablet Take 500 mg by mouth 2 (two) times daily with a meal.   . DEXILANT 30 MG capsule Take 1  capsule by mouth 2 (two) times daily.   [name] EPIPEN 2-PAK 0.3 MG/0.3ML SOAJ injection Use as directed   . FLOVENT HFA 110 MCG/ACT inhaler INHALE 1 PUFF BY MOUTH TWICE A DAY   . HYDROcodone-acetaminophen (NORCO) 10-325 MG tablet TAKE 1 TABLET BY MOUTH EVERY 8 HOURS AS NEEDED FOR PAIN   . meclizine (ANTIVERT) 25 MG tablet Take 1 tablet by mouth 3 (three) times daily as needed.   . montelukast (SINGULAIR) 10 MG tablet Take 10 mg by mouth every morning.   . ondansetron (ZOFRAN-ODT) 4 MG disintegrating tablet Take 1 tablet by mouth 3 (three) times daily as needed.   [name] PRESCRIPTION MEDICATION every 14 (fourteen) days. 2 ALLERGY INJECTIONS EVERY 2 WKS   . tiZANidine (ZANAFLEX) 2 MG tablet Take 1 tablet (2 mg total) by mouth every 6 (six) hours as needed.   . Vitamin D, Ergocalciferol, (DRISDOL) 50000 units CAPS capsule Take 50,000 Units by mouth every 7 (seven) days. THURSDAYS    No facility-administered encounter medications on file as of 04/20/2018.     Surgical History: Past Surgical History:  Procedure Laterality Date  . ACHILLES TENDON SURGERY Right 3/16  . ANTERIOR CERVICAL DECOMP/DISCECTOMY FUSION N/A 03/11/2016   Procedure: C5-6, C6-7 Anterior Cervical Discectomy and Fusion, Allograft, Plate;  Surgeon: Marybelle Killings, MD;  Location: [place];  Service: Orthopedics;  Laterality: N/A;  . CHOLECYSTECTOMY  2014  . COLONOSCOPY WITH PROPOFOL N/A 08/25/2017   Procedure: COLONOSCOPY WITH PROPOFOL;  Surgeon: Manya Silvas, MD;  Location: [facility] ENDOSCOPY;  Service: Endoscopy;  Laterality:  N/A;  . CYSTO WITH HYDRODISTENSION N/A 01/26/2014   Procedure: CYSTOSCOPY/HYDRODISTENSION;  Surgeon: Bernestine Amass, MD;  Location[facility];  Service: Urology;  Laterality: N/A;  . CYSTO/  URETHRAL DILATION/  HYDRODISTENTION/   INSTILLATION THERAPY  07-16-2010//   12-30-2007//   10-27-2006  . EXERCISE TOLERENCE TEST  10-12-2010   NEGATIVE  ADEQUATE ETT/  NO ISCHEMIA OR EVIDENCE HIGH GRADE OBSTRUCTIVE CAD/   NO FURTHER TEST NEEDED  . [place] ENDOMETRIAL ABLATION  2014  . LAPAROSCOPIC OVARIAN CYST BX  2005   AND  URETEROSCOPIC LASER LITHO  STONE EXTRACTION  . SHOULDER OPEN ROTATOR CUFF REPAIR Right 2004  . TONSILLECTOMY    . TRANSTHORACIC ECHOCARDIOGRAM  06-07-2006   normal study/  ef 60-65%  . TUBAL LIGATION Bilateral 1995    Medical History: Past Medical History:  Diagnosis Date  . Anxiety   . Anxiety disorder   . Arthritis   . Chronic low back pain   . Depression   . Dyslipidemia   . Fibromyalgia   . GERD (gastroesophageal reflux disease)   . Headache    hx migraines  . History of kidney stones   . History of panic attacks   . History of renal calculi   . IBS (irritable bowel syndrome)   . Interstitial cystitis   . Lupus ([place])    tested positive for antibodies for lupus. dr to do further tests  . Menorrhagia   . OSA on CPAP    not used cpap for several weeks  . Pneumonia    hx  . PONV (postoperative nausea and vomiting)   . RLS (restless legs syndrome)   . Seasonal asthma   . SI (sacroiliac) joint dysfunction   . SUI (stress urinary incontinence, female)   . UTI (lower urinary tract infection)    hx  . 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 02/23/2013    Family History: Family History  Problem Relation Age of Onset  . Hypertension Mother   . Diabetes Father   . Brain cancer Father   . Fibromyalgia Sister   . Suicidality Sister   . Hypertension Other        Cancer, Cerebrovascular disease run on mother side of family    Social History: Social History   Socioeconomic History  . Marital status: Divorced    Spouse name: Not on file  . Number of children: 3  . Years of education: HS  . Highest education level: 12th grade  Occupational History  . Occupation: Merchandiser, retail: UNEMPLOYED    Comment: Disability  Social Needs  . Financial resource strain: Not on file  . Food insecurity:    Worry: Not on file    Inability: Not on  file  . Transportation needs:    Medical: Not on file    Non-medical: Not on file  Tobacco Use  . Smoking status: Never Smoker  . Smokeless tobacco: Never Used  Substance and Sexual Activity  . Alcohol use: No  . Drug use: No  . Sexual activity: Not on file  Lifestyle  . Physical activity:    Days per week: Not on file    Minutes per session: Not on file  . Stress: Not on file  Relationships  . Social connections:    Talks on phone: Not on file    Gets together: Not on file    Attends religious service: Not on file    Active member  of club or organization: Not on file    Attends meetings of clubs or organizations: Not on file    Relationship status: Not on file  . Intimate partner violence:    Fear of current or ex partner: Not on file    Emotionally abused: Not on file    Physically abused: Not on file    Forced sexual activity: Not on file  Other Topics Concern  . Not on file  Social History Narrative   Patient is right-handed. She avoids caffeine. She has recently been using the treadmill.    Vital Signs: Blood pressure 118/78, pulse 73, resp. rate 16, height 5\' 3"  (1.6 m), weight 237 lb (107.5 kg), SpO2 98 %.  Examination: General Appearance: The patient is well-developed, well-nourished, and in no distress. Skin: Gross inspection of skin unremarkable. Head: normocephalic, no gross deformities. Eyes: no gross deformities noted. ENT: ears appear grossly normal no exudates. Neck: Supple. No thyromegaly. No LAD. Respiratory: few distant rhonchi. Cardiovascular: Normal S1 and S2 without murmur or rub. Extremities: No cyanosis. pulses are equal. Neurologic: Alert and oriented. No involuntary movements.  LABS: No results found for this or any previous visit (from the past 2160 hour(s)).  Radiology: Mr Brain Wo Contrast  Result Date: 02/14/2018 CLINICAL DATA:  Vertigo with nausea for 1 year. Concussion 3 weeks ago. Headaches. EXAM: MRI HEAD WITHOUT CONTRAST  TECHNIQUE: Multiplanar, multiecho pulse sequences of the brain and surrounding structures were obtained without intravenous contrast. COMPARISON:  None. FINDINGS: Brain: No evidence for acute infarction, hemorrhage, mass lesion, hydrocephalus, or extra-axial fluid. Normal for age cerebral volume. Moderately advanced subcortical greater than periventricular white matter signal abnormality, frontal lobe predominant, nonspecific. Chronic microvascular ischemic change is favored. Complicated migraine, vasculitis, chronic infection, or demyelinating disease are other considerations. Vascular: Flow voids are maintained throughout the carotid, basilar, and vertebral arteries. There are no areas of chronic hemorrhage. No posttraumatic sequelae are evident. Skull and upper cervical spine: Normal marrow signal. Upper cervical region unremarkable. Incompletely visualized C5-C7 fusion. Sinuses/Orbits: No significant sinus opacity.  Negative orbits. Other: No mastoid fluid.  Extracranial soft tissues unremarkable. IMPRESSION: Nonspecific, but abnormal, subcortical greater than periventricular white matter signal abnormality. No large vessel/cortical infarct. Etiology is unclear. No posttraumatic sequelae are evident. Electronically Signed   By: Staci Righter M.D.   On: 02/14/2018 14:16    No results found.  No results found.    Assessment and Plan: Patient Active Problem List   Diagnosis Date Noted  . LLQ pain 12/03/2017  . Pre-diabetes 06/03/2017  . Rectal bleeding 06/02/2017  . S/P cervical spinal fusion 03/11/2016  . Bilateral hand pain 11/29/2015  . Elevated C-reactive protein 11/29/2015  . Elevated rheumatoid factor 11/29/2015  . Raynaud's phenomenon without gangrene 11/29/2015  . Bloating 09/12/2014  . Dizziness 08/08/2014  . Peri-menopause 08/08/2014  . Chronic tension-type headache, intractable 07/13/2014  . Gastroesophageal reflux disease without esophagitis 07/12/2014  . Anxiety 04/18/2014  .  Chronic arthritis 04/18/2014  . Cystitis 04/18/2014  . Morbid obesity ([place]) 04/18/2014  . Fibromyalgia 04/18/2014  . 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 Arthropathy 04/18/2014  . Cephalalgia 04/04/2014  . Cervical pain 04/04/2014  . Numbness and tingling 04/04/2014  . 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 Nonspecific abnormal findings on radiological and other examination of skull and head 02/23/2013  . Other nonspecific abnormal result of function study of brain and central nervous system 07/01/2012  . Disturbance of skin  sensation 07/01/2012  . Migraine without aura 07/01/2012  . Abdominal pain 02/11/2012  . Chronic fatigue syndrome 02/11/2012  . Chronic interstitial cystitis 02/11/2012  . Dyspareunia 02/11/2012  . Dysuria 02/11/2012  . Female stress incontinence 02/11/2012  . Irritable bowel syndrome with diarrhea 02/11/2012  . Nausea and vomiting 02/11/2012  . Nocturia 02/11/2012  . Urge incontinence 02/11/2012  . Urinary urgency 02/11/2012  . Lumbar spondylosis 02/04/2012  . Myalgia and myositis 02/04/2012  . Depression 02/04/2012  . Lumbosacral spondylosis without myelopathy 02/04/2012  . OBESITY, UNSPECIFIED 10/03/2010  . GENERALIZED ANXIETY DISORDER 10/03/2010  . CHEST PAIN, PRECORDIAL 10/03/2010   1. Allergic rhinitis due to pollen, unspecified seasonality Allergy shot administered today.  Continue using medications as prescribed.   2. OSA on CPAP Continue current therapy.   3. Gastroesophageal reflux disease, esophagitis presence not specified Continue medications as ordered .  4. Morbid obesity (HCC) Obesity Counseling: Risk Assessment: An assessment of behavioral risk factors was made today and includes lack of exercise sedentary lifestyle, lack of portion control and poor dietary habits.  Risk Modification Advice: She was counseled on portion control guidelines. Restricting daily  caloric intake to. . The detrimental long term effects of obesity on her health and ongoing poor compliance was also discussed with the patient.  5. Encounter for administration of vaccine - Pneumococcal conjugate vaccine 13-valent ordered to pharmacy for first does.    General Counseling: I have discussed the findings of the evaluation and examination with Karina Greer.  I have also discussed any further diagnostic evaluation thatmay be needed or ordered today. Karina Greer verbalizes understanding of the findings of todays visit. We also reviewed her medications today and discussed drug interactions and side effects including but not limited excessive drowsiness and altered mental states. We also discussed that there is always a risk not just to her but also people around her. she has been encouraged to call the office with any questions or concerns that should arise related to todays visit.    Time spent: 25 This patient was seen by Orson Gear AGNP-C in Collaboration with Dr. Devona Konig as a part of collaborative care agreement.  I have personally obtained a history, examined the patient, evaluated laboratory and imaging results, formulated the assessment and plan and placed orders.    Allyne Gee, MD [facility] Pulmonary and Critical Care Sleep medicine

## 2018-04-21 DIAGNOSIS — F411 Generalized anxiety disorder: Secondary | ICD-10-CM | POA: Diagnosis not present

## 2018-04-21 DIAGNOSIS — F331 Major depressive disorder, recurrent, moderate: Secondary | ICD-10-CM | POA: Diagnosis not present

## 2018-04-21 DIAGNOSIS — F332 Major depressive disorder, recurrent severe without psychotic features: Secondary | ICD-10-CM | POA: Diagnosis not present

## 2018-04-24 ENCOUNTER — Encounter: Payer: Self-pay | Admitting: Internal Medicine

## 2018-04-24 NOTE — Patient Instructions (Signed)

## 2018-04-27 ENCOUNTER — Telehealth: Payer: Self-pay | Admitting: Neurology

## 2018-04-27 DIAGNOSIS — R197 Diarrhea, unspecified: Secondary | ICD-10-CM | POA: Diagnosis not present

## 2018-04-27 DIAGNOSIS — K580 Irritable bowel syndrome with diarrhea: Secondary | ICD-10-CM | POA: Diagnosis not present

## 2018-04-27 DIAGNOSIS — R110 Nausea: Secondary | ICD-10-CM | POA: Diagnosis not present

## 2018-04-27 DIAGNOSIS — R1084 Generalized abdominal pain: Secondary | ICD-10-CM | POA: Diagnosis not present

## 2018-04-27 NOTE — Telephone Encounter (Signed)
[place] from [facility] needed the appointment notes for this patient in regards to the previous appointment on 03/30/2018. She stated to please fax this information to [phone]. Thanks.

## 2018-04-27 NOTE — Telephone Encounter (Signed)
Faxed notes

## 2018-04-28 DIAGNOSIS — Z23 Encounter for immunization: Secondary | ICD-10-CM | POA: Diagnosis not present

## 2018-04-28 DIAGNOSIS — F411 Generalized anxiety disorder: Secondary | ICD-10-CM | POA: Diagnosis not present

## 2018-04-28 DIAGNOSIS — F332 Major depressive disorder, recurrent severe without psychotic features: Secondary | ICD-10-CM | POA: Diagnosis not present

## 2018-05-04 ENCOUNTER — Ambulatory Visit: Payer: Self-pay

## 2018-05-04 DIAGNOSIS — F411 Generalized anxiety disorder: Secondary | ICD-10-CM | POA: Diagnosis not present

## 2018-05-04 DIAGNOSIS — F3181 Bipolar II disorder: Secondary | ICD-10-CM | POA: Diagnosis not present

## 2018-05-04 DIAGNOSIS — F5101 Primary insomnia: Secondary | ICD-10-CM | POA: Diagnosis not present

## 2018-05-04 DIAGNOSIS — F4312 Post-traumatic stress disorder, chronic: Secondary | ICD-10-CM | POA: Diagnosis not present

## 2018-05-04 DIAGNOSIS — F332 Major depressive disorder, recurrent severe without psychotic features: Secondary | ICD-10-CM | POA: Diagnosis not present

## 2018-05-04 MED FILL — HYDROCODON-APAP 10-325: 10-325 | 30 days supply | Qty: 90 | Fill #0

## 2018-05-04 MED FILL — predniSONE 20 MG TABS: 20 | 10 days supply | Qty: 11 | Fill #0

## 2018-05-04 MED FILL — AMOXICILLIN 500 MG CAPSULE: 500 | 10 days supply | Qty: 30 | Fill #0

## 2018-05-04 MED FILL — NOREL AD TABLET: 4-10-325 | 5 days supply | Qty: 20 | Fill #0

## 2018-05-04 MED FILL — VYVANSE 50 MG CAPSULE: 50 | 30 days supply | Qty: 30 | Fill #0

## 2018-05-11 ENCOUNTER — Ambulatory Visit: Payer: Self-pay

## 2018-05-15 ENCOUNTER — Encounter: Payer: Self-pay | Admitting: Registered Nurse

## 2018-05-15 ENCOUNTER — Encounter: Payer: Medicare Other | Attending: Physical Medicine & Rehabilitation | Admitting: Registered Nurse

## 2018-05-15 VITALS — BP 142/91 | HR 63 | Ht 63.0 in | Wt 235.0 lb

## 2018-05-15 DIAGNOSIS — Z981 Arthrodesis status: Secondary | ICD-10-CM

## 2018-05-15 DIAGNOSIS — M62838 Other muscle spasm: Secondary | ICD-10-CM | POA: Diagnosis not present

## 2018-05-15 DIAGNOSIS — M542 Cervicalgia: Secondary | ICD-10-CM

## 2018-05-15 DIAGNOSIS — M5412 Radiculopathy, cervical region: Secondary | ICD-10-CM

## 2018-05-15 DIAGNOSIS — M797 Fibromyalgia: Secondary | ICD-10-CM | POA: Diagnosis not present

## 2018-05-15 DIAGNOSIS — Z79899 Other long term (current) drug therapy: Secondary | ICD-10-CM

## 2018-05-15 DIAGNOSIS — M7061 Trochanteric bursitis, right hip: Secondary | ICD-10-CM

## 2018-05-15 DIAGNOSIS — M47816 Spondylosis without myelopathy or radiculopathy, lumbar region: Secondary | ICD-10-CM | POA: Diagnosis not present

## 2018-05-15 DIAGNOSIS — R202 Paresthesia of skin: Secondary | ICD-10-CM | POA: Insufficient documentation

## 2018-05-15 DIAGNOSIS — M79672 Pain in left foot: Secondary | ICD-10-CM

## 2018-05-15 DIAGNOSIS — R200 Anesthesia of skin: Secondary | ICD-10-CM | POA: Insufficient documentation

## 2018-05-15 DIAGNOSIS — M7062 Trochanteric bursitis, left hip: Secondary | ICD-10-CM

## 2018-05-15 DIAGNOSIS — Z5181 Encounter for therapeutic drug level monitoring: Secondary | ICD-10-CM

## 2018-05-15 DIAGNOSIS — G894 Chronic pain syndrome: Secondary | ICD-10-CM

## 2018-05-15 DIAGNOSIS — F419 Anxiety disorder, unspecified: Secondary | ICD-10-CM

## 2018-05-15 MED ORDER — HYDROCODONE-ACETAMINOPHEN 10-325 MG PO TABS: ORAL_TABLET | ORAL | 0 refills | Status: DC

## 2018-05-15 NOTE — Progress Notes (Signed)
Subjective:    Patient ID: Karina Greer, female    DOB: 02-15-1966, 52 y.o.   MRN: [id number]  HPI: [name] is a 52 year old female who returns for follow up appointment for chronic pain and medication refill. She states her pain is located in her neck radiating into her bilateral shoulders, lower back, bilateral hips L>R and left foot pain. She rates her pain 9. Her current exercise regime is walking.   Karina Greer is 30.00 MME. She is also prescribed Alprazolam  by Lorelle Gibbs NP .We have discussed the black box warning of using opioids and benzodiazepines.  highlighted the dangers of using these drugs together and discussed the adverse events including respiratory suppression, overdose, cognitive impairment and importance of compliance with current regimen. We will continue to monitor and adjust as indicated.  She is being closely monitored and under the care of her psychiatrist at the [facility].   Last Oral Swab was Performed on 10/20/2017, it was consistent. Oral Swab was Performed today.   Pain Inventory Average Pain 8 Pain Right Now 9 My pain is constant and stabbing  In the last 24 hours, has pain interfered with the following? General activity 6 Relation with others 6 Enjoyment of life 9 What TIME of day is your pain at its worst? all Sleep (in general) Fair  Pain is worse with: walking, bending, sitting, inactivity, standing and some activites Pain improves with: rest, heat/ice, therapy/exercise, pacing activities, medication and TENS Relief from Meds: 7  Mobility walk without assistance ability to climb steps?  yes do you drive?  yes  Function disabled: date disabled .  Neuro/Psych dizziness anxiety  Prior Studies Any changes since last visit?  no  Physicians involved in your care Any changes since last visit?  no   Family History  Problem Relation Age of Onset  . Hypertension Mother   . Diabetes Father   . Brain cancer  Father   . Fibromyalgia Sister   . Suicidality Sister   . Hypertension Other        Cancer, Cerebrovascular disease run on mother side of family   Social History   Socioeconomic History  . Marital status: Divorced    Spouse name: Not on file  . Number of children: 3  . Years of education: HS  . Highest education level: 12th grade  Occupational History  . Occupation: Merchandiser, retail: UNEMPLOYED    Comment: Disability  Social Needs  . Financial resource strain: Not on file  . Food insecurity:    Worry: Not on file    Inability: Not on file  . Transportation needs:    Medical: Not on file    Non-medical: Not on file  Tobacco Use  . Smoking status: Never Smoker  . Smokeless tobacco: Never Used  Substance and Sexual Activity  . Alcohol use: No  . Drug use: No  . Sexual activity: Not on file  Lifestyle  . Physical activity:    Days per week: Not on file    Minutes per session: Not on file  . Stress: Not on file  Relationships  . Social connections:    Talks on phone: Not on file    Gets together: Not on file    Attends religious service: Not on file    Active member of club or organization: Not on file    Attends meetings of clubs or organizations: Not on file  Relationship status: Not on file  Other Topics Concern  . Not on file  Social History Narrative   Patient is right-handed. She avoids caffeine. She has recently been using the treadmill.   Past Surgical History:  Procedure Laterality Date  . ACHILLES TENDON SURGERY Right 3/16  . ANTERIOR CERVICAL DECOMP/DISCECTOMY FUSION N/A 03/11/2016   Procedure: C5-6, C6-7 Anterior Cervical Discectomy and Fusion, Allograft, Plate;  Surgeon: Marybelle Killings, MD;  Location: [place];  Service: Orthopedics;  Laterality: N/A;  . CHOLECYSTECTOMY  2014  . COLONOSCOPY WITH PROPOFOL N/A 08/25/2017   Procedure: COLONOSCOPY WITH PROPOFOL;  Surgeon: Manya Silvas, MD;  Location[facility] ENDOSCOPY;  Service: Endoscopy;  Laterality:  N/A;  . CYSTO WITH HYDRODISTENSION N/A 01/26/2014   Procedure: CYSTOSCOPY/HYDRODISTENSION;  Surgeon: Bernestine Amass, MD;  Location: [facility];  Service: Urology;  Laterality: N/A;  . CYSTO/  URETHRAL DILATION/  HYDRODISTENTION/   INSTILLATION THERAPY  07-16-2010//   12-30-2007//   10-27-2006  . EXERCISE TOLERENCE TEST  10-12-2010   NEGATIVE  ADEQUATE ETT/  NO ISCHEMIA OR EVIDENCE HIGH GRADE OBSTRUCTIVE CAD/  NO FURTHER TEST NEEDED  . [place] ENDOMETRIAL ABLATION  2014  . LAPAROSCOPIC OVARIAN CYST BX  2005   AND  URETEROSCOPIC LASER LITHO  STONE EXTRACTION  . SHOULDER OPEN ROTATOR CUFF REPAIR Right 2004  . TONSILLECTOMY    . TRANSTHORACIC ECHOCARDIOGRAM  06-07-2006   normal study/  ef 60-65%  . TUBAL LIGATION Bilateral 1995   Past Medical History:  Diagnosis Date  . Anxiety   . Anxiety disorder   . Arthritis   . Chronic low back pain   . Depression   . Dyslipidemia   . Fibromyalgia   . GERD (gastroesophageal reflux disease)   . Headache    hx migraines  . History of kidney stones   . History of panic attacks   . History of renal calculi   . IBS (irritable bowel syndrome)   . Interstitial cystitis   . Lupus ([place])    tested positive for antibodies for lupus. dr to do further tests  . Menorrhagia   . OSA on CPAP    not used cpap for several weeks  . Pneumonia    hx  . PONV (postoperative nausea and vomiting)   . RLS (restless legs syndrome)   . Seasonal asthma   . SI (sacroiliac) joint dysfunction   . SUI (stress urinary incontinence, female)   . UTI (lower urinary tract infection)    hx  . 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 02/23/2013   BP (!) 142/91   Pulse 63   Ht 5\' 3"  (1.6 m)   Wt 235 lb (106.6 kg)   SpO2 95%   BMI 41.63 kg/m   Opioid Risk Score:   Fall Risk Score:  `1  Depression screen PHQ 2/9  Depression screen [facility] 2/9 04/20/2018 01/29/2018 01/28/2018 12/15/2017 12/03/2017 10/20/2017 10/08/2017  Decreased Interest 0 0 1 2 0  2 2  Down, Depressed, Hopeless 0 0 1 2 0 2 2  PHQ - 2 Score 0 0 2 4 0 4 4  Altered sleeping - - - - - - 3  Tired, decreased energy - - - - - - 3  Change in appetite - - - - - - 3  Feeling bad or failure about yourself  - - - - - - 1  Trouble concentrating - - - - - - 2  Moving slowly or  fidgety/restless - - - - - - 0  Suicidal thoughts - - - - - - 0  PHQ-9 Score - - - - - - 16  Difficult doing work/chores - - - - - - Somewhat difficult  Some recent data might be hidden     Review of Systems  Constitutional: Negative.   HENT: Negative.   Eyes: Negative.   Respiratory: Negative.   Cardiovascular: Negative.   Gastrointestinal: Negative.   Endocrine: Negative.   Genitourinary: Negative.   Musculoskeletal: Positive for arthralgias and myalgias.  Skin: Negative.   Allergic/Immunologic: Negative.   Neurological: Negative.   Hematological: Negative.   Psychiatric/Behavioral: Negative.   All other systems reviewed and are negative.      Objective:   Physical Exam  Constitutional: She is oriented to person, place, and time. She appears well-developed and well-nourished.  HENT:  Head: Normocephalic and atraumatic.  Neck: Normal range of motion. Neck supple.  Cardiovascular: Normal rate and regular rhythm.  Pulmonary/Chest: Effort normal and breath sounds normal.  Musculoskeletal:  Normal Muscle Bulk and Muscle Testing Reveals: Upper Extremities: Full ROM and Muscle Strength 5/5 Lumbar Paraspinal Tenderness: L-4-L-5 Bilateral Greater Trochanter Tenderness: R>L Lower Extremities: Full ROM and Muscle Strength 5/5 Arises from chair with ease Narrow Based gait  Neurological: She is alert and oriented to person, place, and time.  Skin: Skin is warm and dry.  Psychiatric: She has a normal mood and affect. Her behavior is normal.  Nursing note and vitals reviewed.         Assessment & Plan:  1. Lumbago/ Lumbar Spondylosis/ Right Lumbar Radiculitis: Continue to Monitor.  05/15/2018 Refilled: HYDROcodone 10/325mg  one tablet every 6 hours as needed #120.  We will continue the opioid monitoring program, this consists of regular clinic visits, examinations, urine drug screen, pill counts as well as use of [place] Controlled Substance Reporting System. 2. Fibromyalgia. Continue Current exercise Regime.05/15/2018 3. Anxiety and depression:Psychiatry Following: Dr.Jessica at the [facility]. Continue Counseling at [facility]. On Xanax,05/15/2018 4. Migraines: On Maxalt. Neurology Following.05/15/2018 5. OSA : Continue exercise regime as tolerated and losing weight.05/15/2018 6. Obesity: Following Healthy Diet Regimen.05/15/2018 7. Status Post Cervical Spinal Fusion: C5C6- C-6- C-7 Anterior Cervical Discectomy Fusion and Allograft Plate:Dr. Lorin Mercy Following.05/15/2018 8. Cervicalgia/ Cervical Radiculitis/ S/P Cervical Spinal Fusion: Continue to Monitor Dr. Lorin Mercy Following. 05/15/2018 9. Muscle Spasm: Continue Tizanidine as needed.05/15/2018 10.Hereditary and Idiopathic Peripheral Neuropathy Continue with Tens Unit. 05/15/2018 11. Bilateral Greater Trochanteric Bursitis: Continue to Alternate Ice and eat Therapy. Continue to Monitor. 05/15/2018.   20 minutes of face to face patient care time was spent during this visit. All questions were encouraged and answered.   F/U in 102month

## 2018-05-16 DIAGNOSIS — Z23 Encounter for immunization: Secondary | ICD-10-CM | POA: Diagnosis not present

## 2018-05-18 DIAGNOSIS — F332 Major depressive disorder, recurrent severe without psychotic features: Secondary | ICD-10-CM | POA: Diagnosis not present

## 2018-05-18 DIAGNOSIS — F411 Generalized anxiety disorder: Secondary | ICD-10-CM | POA: Diagnosis not present

## 2018-05-19 ENCOUNTER — Ambulatory Visit (INDEPENDENT_AMBULATORY_CARE_PROVIDER_SITE_OTHER): Payer: Medicare Other

## 2018-05-19 DIAGNOSIS — J301 Allergic rhinitis due to pollen: Secondary | ICD-10-CM

## 2018-05-20 ENCOUNTER — Telehealth: Payer: Self-pay | Admitting: *Deleted

## 2018-05-20 DIAGNOSIS — F5101 Primary insomnia: Secondary | ICD-10-CM | POA: Diagnosis not present

## 2018-05-20 DIAGNOSIS — F411 Generalized anxiety disorder: Secondary | ICD-10-CM | POA: Diagnosis not present

## 2018-05-20 DIAGNOSIS — F332 Major depressive disorder, recurrent severe without psychotic features: Secondary | ICD-10-CM | POA: Diagnosis not present

## 2018-05-20 DIAGNOSIS — F3181 Bipolar II disorder: Secondary | ICD-10-CM | POA: Diagnosis not present

## 2018-05-20 LAB — DRUG TOX MONITOR 1 W/CONF, ORAL FLD
Amphetamines: NEGATIVE ng/mL (ref <10)
Barbiturates: NEGATIVE ng/mL (ref <10)
Benzodiazepines: NEGATIVE ng/mL (ref <0.50)
Buprenorphine: NEGATIVE ng/mL (ref <0.10)
Cocaine: NEGATIVE ng/mL (ref <5.0)
Codeine: NEGATIVE ng/mL (ref <2.5)
Dihydrocodeine: 3.3 ng/mL — ABNORMAL HIGH (ref <2.5)
Fentanyl: NEGATIVE ng/mL (ref <0.10)
Heroin Metabolite: NEGATIVE ng/mL (ref <1.0)
Hydrocodone: 44.4 ng/mL — ABNORMAL HIGH (ref <2.5)
Hydromorphone: NEGATIVE ng/mL (ref <2.5)
MARIJUANA: NEGATIVE ng/mL (ref <2.5)
MDMA: NEGATIVE ng/mL (ref <10)
Meprobamate: NEGATIVE ng/mL (ref <2.5)
Methadone: NEGATIVE ng/mL (ref <5.0)
Morphine: NEGATIVE ng/mL (ref <2.5)
Nicotine Metabolite: NEGATIVE ng/mL (ref <5.0)
Norhydrocodone: NEGATIVE ng/mL (ref <2.5)
Noroxycodone: NEGATIVE ng/mL (ref <2.5)
Opiates: POSITIVE ng/mL — AB (ref <2.5)
Oxycodone: NEGATIVE ng/mL (ref <2.5)
Oxymorphone: NEGATIVE ng/mL (ref <2.5)
Phencyclidine: NEGATIVE ng/mL (ref <10)
Tapentadol: NEGATIVE ng/mL (ref <5.0)
Tramadol: NEGATIVE ng/mL (ref <5.0)
Zolpidem: NEGATIVE ng/mL (ref <5.0)

## 2018-05-20 LAB — DRUG TOX ALC METAB W/CON, ORAL FLD: Alcohol Metabolite: NEGATIVE ng/mL (ref <25)

## 2018-05-20 NOTE — Telephone Encounter (Signed)
Oral swab drug screen was consistent for prescribed medications.  ?

## 2018-05-25 DIAGNOSIS — F411 Generalized anxiety disorder: Secondary | ICD-10-CM | POA: Diagnosis not present

## 2018-05-25 DIAGNOSIS — F332 Major depressive disorder, recurrent severe without psychotic features: Secondary | ICD-10-CM | POA: Diagnosis not present

## 2018-05-26 DIAGNOSIS — K3189 Other diseases of stomach and duodenum: Secondary | ICD-10-CM | POA: Diagnosis not present

## 2018-05-26 DIAGNOSIS — K219 Gastro-esophageal reflux disease without esophagitis: Secondary | ICD-10-CM | POA: Diagnosis not present

## 2018-05-26 DIAGNOSIS — E119 Type 2 diabetes mellitus without complications: Secondary | ICD-10-CM | POA: Diagnosis not present

## 2018-05-26 DIAGNOSIS — K2970 Gastritis, unspecified, without bleeding: Secondary | ICD-10-CM | POA: Diagnosis not present

## 2018-05-26 DIAGNOSIS — K2950 Unspecified chronic gastritis without bleeding: Secondary | ICD-10-CM | POA: Diagnosis not present

## 2018-05-26 DIAGNOSIS — R1013 Epigastric pain: Secondary | ICD-10-CM | POA: Diagnosis not present

## 2018-05-26 DIAGNOSIS — F418 Other specified anxiety disorders: Secondary | ICD-10-CM | POA: Diagnosis not present

## 2018-06-01 ENCOUNTER — Ambulatory Visit (INDEPENDENT_AMBULATORY_CARE_PROVIDER_SITE_OTHER): Payer: Medicare Other

## 2018-06-01 DIAGNOSIS — F5101 Primary insomnia: Secondary | ICD-10-CM | POA: Diagnosis not present

## 2018-06-01 DIAGNOSIS — F4312 Post-traumatic stress disorder, chronic: Secondary | ICD-10-CM | POA: Diagnosis not present

## 2018-06-01 DIAGNOSIS — F332 Major depressive disorder, recurrent severe without psychotic features: Secondary | ICD-10-CM | POA: Diagnosis not present

## 2018-06-01 DIAGNOSIS — F411 Generalized anxiety disorder: Secondary | ICD-10-CM | POA: Diagnosis not present

## 2018-06-01 DIAGNOSIS — F3181 Bipolar II disorder: Secondary | ICD-10-CM | POA: Diagnosis not present

## 2018-06-01 DIAGNOSIS — J301 Allergic rhinitis due to pollen: Secondary | ICD-10-CM | POA: Diagnosis not present

## 2018-06-01 MED FILL — VYVANSE 40 MG CAPSULE: 40 | 30 days supply | Qty: 30 | Fill #0

## 2018-06-01 MED FILL — HYDROCODON-APAP 10-325: 10-325 | 30 days supply | Qty: 90 | Fill #0

## 2018-06-01 MED FILL — ARIPiprazole 5 MG TABS: 5 | 30 days supply | Qty: 30 | Fill #0

## 2018-06-01 MED FILL — ALPRAZolam 0.25 MG TABS: 0.25 | 30 days supply | Qty: 60 | Fill #0

## 2018-06-07 ENCOUNTER — Other Ambulatory Visit: Payer: Self-pay | Admitting: Internal Medicine

## 2018-06-08 DIAGNOSIS — F411 Generalized anxiety disorder: Secondary | ICD-10-CM | POA: Diagnosis not present

## 2018-06-08 DIAGNOSIS — F332 Major depressive disorder, recurrent severe without psychotic features: Secondary | ICD-10-CM | POA: Diagnosis not present

## 2018-06-11 ENCOUNTER — Encounter (INDEPENDENT_AMBULATORY_CARE_PROVIDER_SITE_OTHER): Payer: Self-pay | Admitting: Surgery

## 2018-06-11 ENCOUNTER — Ambulatory Visit (INDEPENDENT_AMBULATORY_CARE_PROVIDER_SITE_OTHER): Payer: Medicare Other

## 2018-06-11 ENCOUNTER — Ambulatory Visit (INDEPENDENT_AMBULATORY_CARE_PROVIDER_SITE_OTHER): Payer: Medicare Other | Admitting: Surgery

## 2018-06-11 DIAGNOSIS — M7711 Lateral epicondylitis, right elbow: Secondary | ICD-10-CM

## 2018-06-11 DIAGNOSIS — M25521 Pain in right elbow: Secondary | ICD-10-CM | POA: Diagnosis not present

## 2018-06-11 NOTE — Progress Notes (Signed)
Office Visit Note   Patient: Karina Greer           Date of Birth: 06-Apr-1966           MRN: [id number] Visit Date: 06/11/2018              Requested by: Cyndi Bender, PA-C [address] [place], [place] [address] PCP: Cyndi Bender, PA-C   Assessment & Plan: Visit Diagnoses:  1. Pain in right elbow   2. Right tennis elbow     Plan: At this point recommend conservative treatment.  Patient was given some stretching exercises to work on.  Gave her a sample of [place]ns[place] to use twice daily at the site of pain.  She will also get an over-the-counter tennis elbow strap and see if this helps.  Follow-up the office in 4 weeks for recheck.  If symptoms are not improved we did discuss possible Marcaine/Depo-Medrol injection for tennis elbow.  Follow-Up Instructions: Return in about 4 weeks (around 07/09/2018) for with Dr Lorin Mercy. .   Orders:  Orders Placed This Encounter  Procedures  . XR Elbow 2 Views Right   No orders of the defined types were placed in this encounter.     Procedures: No procedures performed   Clinical Data: No additional findings.   Subjective: Chief Complaint  Patient presents with  . Arm Pain    HPI Patient is in today with complaints of right lateral elbow pain.  States that pain started a couple weeks ago.  No injury.  Pain aggravated with gripping objects and wrist extension.  No complaints of numbness and tingling.  She has had some soreness in her right shoulder with overhead reaching but this is not associated with the elbow.  States that she does have a history of previous right shoulder bursitis/tendinitis.  No true complaints of cervical radiculopathy. Review of Systems No current cardiac pulmonary GI GU issues  Objective: Vital Signs: There were no vitals taken for this visit.  Physical Exam  Constitutional: She is oriented to person, place, and time. No distress.  HENT:  Head: Normocephalic and atraumatic.  Eyes: Pupils are equal,  round, and reactive to light. EOM are normal.  Musculoskeletal:  Exam right elbow good range of motion.  No swelling or bruising around the elbow.  She is markedly tender over the lateral epicondyle.  Pain lateral elbow with resisted forearm supination and wrist extension.  Lateral elbow pain with grip testing.  Negative Tinel's over the cubital tunnel.  Triceps tendon nontender.  Neurovascular intact.  Neurological: She is alert and oriented to person, place, and time.  Skin: Skin is warm and dry.  Psychiatric: She has a normal mood and affect.    Ortho Exam  Specialty Comments:  No specialty comments available.  Imaging: No results found.   PMFS History: Patient Active Problem List   Diagnosis Date Noted  . LLQ pain 12/03/2017  . Pre-diabetes 06/03/2017  . Rectal bleeding 06/02/2017  . S/P cervical spinal fusion 03/11/2016  . Bilateral hand pain 11/29/2015  . Elevated C-reactive protein 11/29/2015  . Elevated rheumatoid factor 11/29/2015  . Raynaud's phenomenon without gangrene 11/29/2015  . Bloating 09/12/2014  . Dizziness 08/08/2014  . Peri-menopause 08/08/2014  . Chronic tension-type headache, intractable 07/13/2014  . Gastroesophageal reflux disease without esophagitis 07/12/2014  . Anxiety 04/18/2014  . Chronic arthritis 04/18/2014  . Cystitis 04/18/2014  . Morbid obesity ([place]) 04/18/2014  . Fibromyalgia 04/18/2014  . 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 Arthropathy 04/18/2014  . Cephalalgia 04/04/2014  . Cervical pain 04/04/2014  . Numbness and tingling 04/04/2014  . 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 Nonspecific abnormal findings on radiological and other examination of skull and head 02/23/2013  . Other nonspecific abnormal result of function study of brain and central nervous system 07/01/2012  . Disturbance of skin sensation 07/01/2012  . Migraine without aura 07/01/2012  .  Abdominal pain 02/11/2012  . Chronic fatigue syndrome 02/11/2012  . Chronic interstitial cystitis 02/11/2012  . Dyspareunia 02/11/2012  . Dysuria 02/11/2012  . Female stress incontinence 02/11/2012  . Irritable bowel syndrome with diarrhea 02/11/2012  . Nausea and vomiting 02/11/2012  . Nocturia 02/11/2012  . Urge incontinence 02/11/2012  . Urinary urgency 02/11/2012  . Lumbar spondylosis 02/04/2012  . Myalgia and myositis 02/04/2012  . Depression 02/04/2012  . Lumbosacral spondylosis without myelopathy 02/04/2012  . OBESITY, UNSPECIFIED 10/03/2010  . GENERALIZED ANXIETY DISORDER 10/03/2010  . CHEST PAIN, PRECORDIAL 10/03/2010   Past Medical History:  Diagnosis Date  . Anxiety   . Anxiety disorder   . Arthritis   . Chronic low back pain   . Depression   . Dyslipidemia   . Fibromyalgia   . GERD (gastroesophageal reflux disease)   . Headache    hx migraines  . History of kidney stones   . History of panic attacks   . History of renal calculi   . IBS (irritable bowel syndrome)   . Interstitial cystitis   . Lupus ([place])    tested positive for antibodies for lupus. dr to do further tests  . Menorrhagia   . OSA on CPAP    not used cpap for several weeks  . Pneumonia    hx  . PONV (postoperative nausea and vomiting)   . RLS (restless legs syndrome)   . Seasonal asthma   . SI (sacroiliac) joint dysfunction   . SUI (stress urinary incontinence, female)   . UTI (lower urinary tract infection)    hx  . 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 Hypertension Mother   . Diabetes Father   . Brain cancer Father   . Fibromyalgia Sister   . Suicidality Sister   . Hypertension Other        Cancer, Cerebrovascular disease run on mother side of family    Past Surgical History:  Procedure Laterality Date  . ACHILLES TENDON SURGERY Right 3/16  . ANTERIOR CERVICAL DECOMP/DISCECTOMY FUSION N/A 03/11/2016   Procedure:  C5-6, C6-7 Anterior Cervical Discectomy and Fusion, Allograft, Plate;  Surgeon: Marybelle Killings, MD;  Location: [place];  Service: Orthopedics;  Laterality: N/A;  . CHOLECYSTECTOMY  2014  . COLONOSCOPY WITH PROPOFOL N/A 08/25/2017   Procedure: COLONOSCOPY WITH PROPOFOL;  Surgeon: Manya Silvas, MD;  Location: [facility] ENDOSCOPY;  Service: Endoscopy;  Laterality: N/A;  . CYSTO WITH HYDRODISTENSION N/A 01/26/2014   Procedure: CYSTOSCOPY/HYDRODISTENSION;  Surgeon: Bernestine Amass, MD;  Location: [facility];  Service: Urology;  Laterality: N/A;  . CYSTO/  URETHRAL DILATION/  HYDRODISTENTION/   INSTILLATION THERAPY  07-16-2010//   12-30-2007//   10-27-2006  . EXERCISE TOLERENCE TEST  10-12-2010   NEGATIVE  ADEQUATE ETT/  NO ISCHEMIA OR EVIDENCE HIGH GRADE OBSTRUCTIVE CAD/  NO FURTHER TEST NEEDED  . [place] ENDOMETRIAL ABLATION  2014  . LAPAROSCOPIC OVARIAN CYST BX  2005   AND  URETEROSCOPIC LASER LITHO  STONE EXTRACTION  . SHOULDER OPEN ROTATOR CUFF REPAIR Right 2004  . TONSILLECTOMY    . TRANSTHORACIC ECHOCARDIOGRAM  06-07-2006   normal study/  ef 60-65%  . TUBAL LIGATION Bilateral 1995   Social History   Occupational History  . Occupation: Merchandiser, retail: UNEMPLOYED    Comment: Disability  Tobacco Use  . Smoking status: Never Smoker  . Smokeless tobacco: Never Used  Substance and Sexual Activity  . Alcohol use: No  . Drug use: No  . Sexual activity: Not on file

## 2018-06-15 ENCOUNTER — Ambulatory Visit: Payer: Self-pay

## 2018-06-16 ENCOUNTER — Encounter: Payer: Medicare Other | Admitting: Registered Nurse

## 2018-06-17 ENCOUNTER — Ambulatory Visit: Payer: Medicare Other

## 2018-06-17 ENCOUNTER — Encounter: Payer: Self-pay | Admitting: Adult Health

## 2018-06-17 ENCOUNTER — Ambulatory Visit (INDEPENDENT_AMBULATORY_CARE_PROVIDER_SITE_OTHER): Payer: Medicare Other | Admitting: Adult Health

## 2018-06-17 VITALS — BP 138/98 | HR 84 | Temp 98.4°F | Resp 16 | Ht 63.0 in | Wt 233.0 lb

## 2018-06-17 DIAGNOSIS — R05 Cough: Secondary | ICD-10-CM | POA: Diagnosis not present

## 2018-06-17 DIAGNOSIS — R059 Cough, unspecified: Secondary | ICD-10-CM

## 2018-06-17 DIAGNOSIS — J0110 Acute frontal sinusitis, unspecified: Secondary | ICD-10-CM | POA: Diagnosis not present

## 2018-06-17 MED ORDER — AMOXICILLIN-POT CLAVULANATE 875-125 MG PO TABS: 1.0000 | ORAL_TABLET | Freq: Two times a day (BID) | ORAL | 0 refills | Status: DC

## 2018-06-17 NOTE — Patient Instructions (Signed)

## 2018-06-17 NOTE — Progress Notes (Signed)
[facility] [place], [place] [address]  Internal MEDICINE  Office Visit Note  Patient Name: Karina Greer  [id number]  [id number]  Date of Service: 06/20/2018  Chief Complaint  Patient presents with  . Sinusitis    started Friday. mucinex once   . Cough  . Sore Throat  . Ear Pain    mainly left ear      HPI Pt is here for a sick visit.  Pt reports 5 days of cough, sore throat, sinus congestion, chest congestions and left ear pain.  She reports feeling chills and fever over the last couple of days. Reports her 52 year old nephew has been sick and on antibiotics. She reports having to use Flonase, saline nasal spray and her nebulizer with minimal relief.      Current Medication:  Outpatient Encounter Medications as of 06/17/2018  Medication Sig Note  . albuterol (PROVENTIL HFA;VENTOLIN HFA) 108 (90 BASE) MCG/ACT inhaler Inhale 2 puffs into the lungs every 6 (six) hours as needed.    [name] albuterol (PROVENTIL) (2.5 MG/3ML) 0.083% nebulizer solution INHALE 1 VIAL VIA NEBULIZER EVERY 6 HOURS AS NEEDED   . ALPRAZolam (XANAX) 1 MG tablet TAKE 1/2 TABLET BY MOUTH EVERY MORNING & TAKE 1 TABLET AT BEDTIME 02/25/2018: LD: 02/22/2018  . b complex vitamins tablet Take 1 tablet by mouth daily.   [name] DEXILANT 30 MG capsule Take 1 capsule by mouth 2 (two) times daily.   [name] dicyclomine (BENTYL) 20 MG tablet Take 20 mg by mouth every 6 (six) hours.   [name] EPIPEN 2-PAK 0.3 MG/0.3ML SOAJ injection Use as directed   . FLOVENT HFA 110 MCG/ACT inhaler INHALE 1 PUFF BY MOUTH TWICE A DAY   . meclizine (ANTIVERT) 25 MG tablet Take 1 tablet by mouth 3 (three) times daily as needed.   . montelukast (SINGULAIR) 10 MG tablet Take 10 mg by mouth every morning.   . ondansetron (ZOFRAN-ODT) 4 MG disintegrating tablet Take 1 tablet by mouth 3 (three) times daily as needed.   [name] PRESCRIPTION MEDICATION every 14 (fourteen) days. 2 ALLERGY INJECTIONS EVERY 2 WKS   . sucralfate (CARAFATE) 1 g  tablet Take 1 g by mouth 4 (four) times daily -  with meals and at bedtime.   [name] tiZANidine (ZANAFLEX) 2 MG tablet Take 1 tablet (2 mg total) by mouth every 6 (six) hours as needed.   . Vitamin D, Ergocalciferol, (DRISDOL) 50000 units CAPS capsule Take 50,000 Units by mouth every 7 (seven) days. THURSDAYS   . [DISCONTINUED] HYDROcodone-acetaminophen (NORCO) 10-325 MG tablet TAKE 1 TABLET BY MOUTH EVERY 8 HOURS AS NEEDED FOR PAIN 06/19/2018: Prescribed #90 on 06/01/18 counted #36 last dose 06/18/18  . amoxicillin-clavulanate (AUGMENTIN) 875-125 MG tablet Take 1 tablet by mouth 2 (two) times daily.   [name] azelastine (ASTELIN) 0.1 % nasal spray SPRAY 2 SPRAYS INTO EACH NOSTRIL TWICE A DAY   . [DISCONTINUED] cefUROXime (CEFTIN) 500 MG tablet Take 500 mg by mouth 2 (two) times daily with a meal.    No facility-administered encounter medications on file as of 06/17/2018.       Medical History: Past Medical History:  Diagnosis Date  . Anxiety   . Anxiety disorder   . Arthritis   . Chronic low back pain   . Depression   . Dyslipidemia   . Fibromyalgia   . GERD (gastroesophageal reflux disease)   . Headache    hx migraines  . History of kidney stones   .  History of panic attacks   . History of renal calculi   . IBS (irritable bowel syndrome)   . Interstitial cystitis   . Lupus ([place])    tested positive for antibodies for lupus. dr to do further tests  . Menorrhagia   . OSA on CPAP    not used cpap for several weeks  . Pneumonia    hx  . PONV (postoperative nausea and vomiting)   . RLS (restless legs syndrome)   . Seasonal asthma   . SI (sacroiliac) joint dysfunction   . SUI (stress urinary incontinence, female)   . UTI (lower urinary tract infection)    hx  . 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 02/23/2013     Vital Signs: BP (!) 138/98   Pulse 84   Temp 98.4 F (36.9 C)   Resp 16   Ht 5\' 3"  (1.6 m)   Wt 233 lb (105.7 kg)   SpO2 97%   BMI 41.27 kg/m    Review of Systems   Constitutional: Positive for fatigue and fever. Negative for chills and unexpected weight change.  HENT: Positive for congestion, ear pain, postnasal drip, rhinorrhea, sinus pressure, sinus pain and sore throat. Negative for sneezing.   Eyes: Negative for photophobia, pain and redness.  Respiratory: Positive for cough and chest tightness. Negative for shortness of breath.   Cardiovascular: Negative for chest pain and palpitations.  Gastrointestinal: Negative for abdominal pain, constipation, diarrhea, nausea and vomiting.  Endocrine: Negative.   Genitourinary: Negative for dysuria and frequency.  Musculoskeletal: Negative for arthralgias, back pain, joint swelling and neck pain.  Skin: Negative for rash.  Allergic/Immunologic: Negative.   Neurological: Negative for tremors and numbness.  Hematological: Negative for adenopathy. Does not bruise/bleed easily.  Psychiatric/Behavioral: Negative for behavioral problems and sleep disturbance. The patient is not nervous/anxious.     Physical Exam  Constitutional: She is oriented to person, place, and time. She appears well-developed and well-nourished. No distress.  HENT:  Head: Normocephalic and atraumatic.  Mouth/Throat: Oropharynx is clear and moist. No oropharyngeal exudate.  Eyes: Pupils are equal, round, and reactive to light. EOM are normal.  Neck: Normal range of motion. Neck supple. No JVD present. No tracheal deviation present. No thyromegaly present.  Cardiovascular: Normal rate, regular rhythm and normal heart sounds. Exam reveals no gallop and no friction rub.  No murmur heard. Pulmonary/Chest: Effort normal and breath sounds normal. No respiratory distress. She has no wheezes. She has no rales. She exhibits no tenderness.  Abdominal: Soft. There is no tenderness. There is no guarding.  Musculoskeletal: Normal range of motion.  Lymphadenopathy:    She has no cervical adenopathy.  Neurological: She is alert and oriented to person,  place, and time. No cranial nerve deficit.  Skin: Skin is warm and dry. She is not diaphoretic.  Psychiatric: She has a normal mood and affect. Her behavior is normal. Judgment and thought content normal.  Nursing note and vitals reviewed.  Assessment/Plan: 1. Acute non-recurrent frontal sinusitis Take Augmentin as directed.  Return to clinic for new or worsening symptoms.  - amoxicillin-clavulanate (AUGMENTIN) 875-125 MG tablet; Take 1 tablet by mouth 2 (two) times daily.  Dispense: 20 tablet; Refill: 0  2. Cough Cough, due to Post nasal drip.  General Counseling: maude hettich understanding of the findings of todays visit and agrees with plan of treatment. I have discussed any further diagnostic evaluation that may be needed or ordered today. We also reviewed her medications today. she has  been encouraged to call the office with any questions or concerns that should arise related to todays visit.   No orders of the defined types were placed in this encounter.   Meds ordered this encounter  Medications  . amoxicillin-clavulanate (AUGMENTIN) 875-125 MG tablet    Sig: Take 1 tablet by mouth 2 (two) times daily.    Dispense:  20 tablet    Refill:  0    Time spent: 25 Minutes  This patient was seen by Orson Gear AGNP-C in Collaboration with Dr Lavera Guise as a part of collaborative care agreement

## 2018-06-19 ENCOUNTER — Encounter: Payer: Self-pay | Admitting: Registered Nurse

## 2018-06-19 ENCOUNTER — Encounter: Payer: Medicare Other | Attending: Physical Medicine & Rehabilitation | Admitting: Registered Nurse

## 2018-06-19 ENCOUNTER — Other Ambulatory Visit: Payer: Self-pay

## 2018-06-19 VITALS — BP 119/86 | HR 71 | Ht 63.0 in | Wt 233.8 lb

## 2018-06-19 DIAGNOSIS — M5412 Radiculopathy, cervical region: Secondary | ICD-10-CM

## 2018-06-19 DIAGNOSIS — F3289 Other specified depressive episodes: Secondary | ICD-10-CM

## 2018-06-19 DIAGNOSIS — G894 Chronic pain syndrome: Secondary | ICD-10-CM

## 2018-06-19 DIAGNOSIS — M47816 Spondylosis without myelopathy or radiculopathy, lumbar region: Secondary | ICD-10-CM | POA: Diagnosis not present

## 2018-06-19 DIAGNOSIS — M7061 Trochanteric bursitis, right hip: Secondary | ICD-10-CM

## 2018-06-19 DIAGNOSIS — M62838 Other muscle spasm: Secondary | ICD-10-CM

## 2018-06-19 DIAGNOSIS — Z79899 Other long term (current) drug therapy: Secondary | ICD-10-CM | POA: Diagnosis not present

## 2018-06-19 DIAGNOSIS — M797 Fibromyalgia: Secondary | ICD-10-CM | POA: Diagnosis not present

## 2018-06-19 DIAGNOSIS — M542 Cervicalgia: Secondary | ICD-10-CM | POA: Diagnosis not present

## 2018-06-19 DIAGNOSIS — R200 Anesthesia of skin: Secondary | ICD-10-CM | POA: Diagnosis not present

## 2018-06-19 DIAGNOSIS — Z981 Arthrodesis status: Secondary | ICD-10-CM | POA: Diagnosis not present

## 2018-06-19 DIAGNOSIS — Z5181 Encounter for therapeutic drug level monitoring: Secondary | ICD-10-CM

## 2018-06-19 DIAGNOSIS — R202 Paresthesia of skin: Secondary | ICD-10-CM | POA: Diagnosis not present

## 2018-06-19 DIAGNOSIS — G43001 Migraine without aura, not intractable, with status migrainosus: Secondary | ICD-10-CM

## 2018-06-19 DIAGNOSIS — F419 Anxiety disorder, unspecified: Secondary | ICD-10-CM

## 2018-06-19 MED ORDER — HYDROCODONE-ACETAMINOPHEN 10-325 MG PO TABS: ORAL_TABLET | ORAL | 0 refills | Status: DC

## 2018-06-19 NOTE — Progress Notes (Signed)
Subjective:    Patient ID: Karina Greer, female    DOB: 1965/12/01, 52 y.o.   MRN: [id number]  HPI: Karina Greer is a 52 year old female who returns for follow up appointment for chronic pain and medication refill. She states her pain is located in her lower back and right hip. Also reports she has a migraine today, reports it started yesterday at 2:00 p.m. And it's beginning to ease off. She was instructed if migraine becomes worse to follow up with her PCP or go to the emergency room for evaluation. She verbalizes understanding.   Karina Greer Morphine Equivalent is 30.00 MME. Last Oral Swab was Performed on 05/15/2018, it was consistent.    Pain Inventory Average Pain 7 Pain Right Now 8 My pain is constant, sharp and stabbing  In the last 24 hours, has pain interfered with the following? General activity 9 Relation with others 9 Enjoyment of life 9 What TIME of day is your pain at its worst? all Sleep (in general) Fair  Pain is worse with: walking, bending, sitting, inactivity, standing and some activites Pain improves with: rest, heat/ice, therapy/exercise, pacing activities, medication and TENS Relief from Meds: 6  Mobility walk without assistance how many minutes can you walk? 15 ability to climb steps?  yes do you drive?  yes  Function disabled: date disabled n/a I need assistance with the following:  meal prep, household duties and shopping  Neuro/Psych dizziness anxiety  Prior Studies Any changes since last visit?  no  Physicians involved in your care Any changes since last visit?  no   Family History  Problem Relation Age of Onset  . Hypertension Mother   . Diabetes Father   . Brain cancer Father   . Fibromyalgia Sister   . Suicidality Sister   . Hypertension Other        Cancer, Cerebrovascular disease run on mother side of family   Social History   Socioeconomic History  . Marital status: Divorced    Spouse name: Not on file  . Number  of children: 3  . Years of education: HS  . Highest education level: 12th grade  Occupational History  . Occupation: Merchandiser, retail: UNEMPLOYED    Comment: Disability  Social Needs  . Financial resource strain: Not on file  . Food insecurity:    Worry: Not on file    Inability: Not on file  . Transportation needs:    Medical: Not on file    Non-medical: Not on file  Tobacco Use  . Smoking status: Never Smoker  . Smokeless tobacco: Never Used  Substance and Sexual Activity  . Alcohol use: No  . Drug use: No  . Sexual activity: Not on file  Lifestyle  . Physical activity:    Days per week: Not on file    Minutes per session: Not on file  . Stress: Not on file  Relationships  . Social connections:    Talks on phone: Not on file    Gets together: Not on file    Attends religious service: Not on file    Active member of club or organization: Not on file    Attends meetings of clubs or organizations: Not on file    Relationship status: Not on file  Other Topics Concern  . Not on file  Social History Narrative   Patient is right-handed. She avoids caffeine. She has recently been using the treadmill.   Past Surgical  History:  Procedure Laterality Date  . ACHILLES TENDON SURGERY Right 3/16  . ANTERIOR CERVICAL DECOMP/DISCECTOMY FUSION N/A 03/11/2016   Procedure: C5-6, C6-7 Anterior Cervical Discectomy and Fusion, Allograft, Plate;  Surgeon: Marybelle Killings, MD;  Location: [facility];  Service: Orthopedics;  Laterality: N/A;  . CHOLECYSTECTOMY  2014  . COLONOSCOPY WITH PROPOFOL N/A 08/25/2017   Procedure: COLONOSCOPY WITH PROPOFOL;  Surgeon: Manya Silvas, MD;  Location: [facility] ENDOSCOPY;  Service: Endoscopy;  Laterality: N/A;  . CYSTO WITH HYDRODISTENSION N/A 01/26/2014   Procedure: CYSTOSCOPY/HYDRODISTENSION;  Surgeon: Bernestine Amass, MD;  Location: [facility];  Service: Urology;  Laterality: N/A;  . CYSTO/  URETHRAL DILATION/  HYDRODISTENTION/   INSTILLATION  THERAPY  07-16-2010//   12-30-2007//   10-27-2006  . EXERCISE TOLERENCE TEST  10-12-2010   NEGATIVE  ADEQUATE ETT/  NO ISCHEMIA OR EVIDENCE HIGH GRADE OBSTRUCTIVE CAD/  NO FURTHER TEST NEEDED  . [place] ENDOMETRIAL ABLATION  2014  . LAPAROSCOPIC OVARIAN CYST BX  2005   AND  URETEROSCOPIC LASER LITHO  STONE EXTRACTION  . SHOULDER OPEN ROTATOR CUFF REPAIR Right 2004  . TONSILLECTOMY    . TRANSTHORACIC ECHOCARDIOGRAM  06-07-2006   normal study/  ef 60-65%  . TUBAL LIGATION Bilateral 1995   Past Medical History:  Diagnosis Date  . Anxiety   . Anxiety disorder   . Arthritis   . Chronic low back pain   . Depression   . Dyslipidemia   . Fibromyalgia   . GERD (gastroesophageal reflux disease)   . Headache    hx migraines  . History of kidney stones   . History of panic attacks   . History of renal calculi   . IBS (irritable bowel syndrome)   . Interstitial cystitis   . Lupus ([place])    tested positive for antibodies for lupus. dr to do further tests  . Menorrhagia   . OSA on CPAP    not used cpap for several weeks  . Pneumonia    hx  . PONV (postoperative nausea and vomiting)   . RLS (restless legs syndrome)   . Seasonal asthma   . SI (sacroiliac) joint dysfunction   . SUI (stress urinary incontinence, female)   . UTI (lower urinary tract infection)    hx  . 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 02/23/2013   BP 119/86   Pulse 71   Ht 5\' 3"  (1.6 m)   Wt 233 lb 12.8 oz (106.1 kg)   SpO2 96%   BMI 41.42 kg/m   Opioid Risk Score:   Fall Risk Score:  `1  Depression screen PHQ 2/9  Depression screen [facility] 2/9 06/19/2018 04/20/2018 01/29/2018 01/28/2018 12/15/2017 12/03/2017 10/20/2017  Decreased Interest 1 0 0 1 2 0 2  Down, Depressed, Hopeless 1 0 0 1 2 0 2  PHQ - 2 Score 2 0 0 2 4 0 4  Altered sleeping - - - - - - -  Tired, decreased energy - - - - - - -  Change in appetite - - - - - - -  Feeling bad or failure about yourself  - - - - - - -  Trouble  concentrating - - - - - - -  Moving slowly or fidgety/restless - - - - - - -  Suicidal thoughts - - - - - - -  PHQ-9 Score - - - - - - -  Difficult doing work/chores - - - - - - -  Some recent data might be hidden   Review of Systems  Constitutional: Negative.   HENT: Negative.   Eyes: Negative.   Respiratory:       Respiratory infection   Cardiovascular: Negative.   Gastrointestinal: Positive for abdominal pain and nausea.  Endocrine: Negative.   Genitourinary: Negative.   Musculoskeletal: Negative.   Skin: Negative.   Allergic/Immunologic: Negative.   Neurological: Negative.   Hematological: Negative.   Psychiatric/Behavioral: Negative.   All other systems reviewed and are negative.      Objective:   Physical Exam  Constitutional: She is oriented to person, place, and time. She appears well-developed and well-nourished.  HENT:  Head: Normocephalic and atraumatic.  Neck: Normal range of motion. Neck supple.  Cardiovascular: Normal rate and regular rhythm.  Pulmonary/Chest: Effort normal and breath sounds normal.  Musculoskeletal:  Normal Muscle Bulk and Muscle Testing Reveals: Upper Extremities: Full ROM and Muscle Strength 5/5 Lumbar Paraspinal Tenderness: L-3-L-5 Lower Extremities: Full ROM and Muscle Strength 5/5 Arises from Table with ease Narrow Based Gait  Neurological: She is alert and oriented to person, place, and time.  Skin: Skin is warm and dry.  Psychiatric: She has a normal mood and affect. Her behavior is normal.  Nursing note and vitals reviewed.         Assessment & Plan:  1. Lumbago/ Lumbar Spondylosis/ Right Lumbar Radiculitis: Continue to Monitor.06/19/2018 Refilled: HYDROcodone 10/325mg  one tablet every 8 hours as needed #90.  We will continue the opioid monitoring program, this consists of regular clinic visits, examinations, urine drug screen, pill counts as well as use of [place] Controlled Substance Reporting System. 2.  Fibromyalgia. Continue Current exercise Regime.06/19/2018 3. Anxiety and depression:Psychiatry Following: [facility]. Continue Counseling at [facility]. On Xanax,06/19/2018 4. Migraines: On Maxalt. Neurology Following.06/19/2018 5. OSA : Continue exercise regime as tolerated and losing weight.06/19/2018 6. Obesity: Following Healthy Diet Regimen.06/19/2018 7. Status Post Cervical Spinal Fusion: C5C6- C-6- C-7 Anterior Cervical Discectomy Fusion and Allograft Plate:Dr. Lorin Mercy Following.06/19/2018 8. Cervicalgia/ Cervical Radiculitis/ S/P Cervical Spinal Fusion: Continueto MonitorDr. Yates Following. 06/19/2018 9. Muscle Spasm: Continue Tizanidine as needed.06/19/2018 10.Hereditary and Idiopathic Peripheral Neuropathy Continue with Tens Unit. 06/19/2018 11. Bilateral Greater Trochanteric Bursitis: Continue to Alternate Ice and eat Therapy. Continue to Monitor. 06/19/2018.  20 minutes of face to face patient care time was spent during this visit. All questions were encouraged and answered.   F/U in 66month

## 2018-06-23 ENCOUNTER — Telehealth: Payer: Self-pay

## 2018-06-23 NOTE — Telephone Encounter (Signed)
Pt called stating she has been having trouble getting in touch with the company to get supplies for TENS unit. I called pt back and advised her to call back and let us know which company she received the TENS unit from and then we can give her the number or call to see what is going on.

## 2018-06-23 NOTE — Telephone Encounter (Signed)
Pt called back and said it was [employer], April gave her Kerrie Pleasure number.

## 2018-06-24 DIAGNOSIS — R21 Rash and other nonspecific skin eruption: Secondary | ICD-10-CM | POA: Diagnosis not present

## 2018-06-24 DIAGNOSIS — M359 Systemic involvement of connective tissue, unspecified: Secondary | ICD-10-CM | POA: Diagnosis not present

## 2018-06-25 DIAGNOSIS — Z1231 Encounter for screening mammogram for malignant neoplasm of breast: Secondary | ICD-10-CM | POA: Diagnosis not present

## 2018-06-25 DIAGNOSIS — Z01419 Encounter for gynecological examination (general) (routine) without abnormal findings: Secondary | ICD-10-CM | POA: Diagnosis not present

## 2018-06-25 DIAGNOSIS — Z124 Encounter for screening for malignant neoplasm of cervix: Secondary | ICD-10-CM | POA: Diagnosis not present

## 2018-06-25 DIAGNOSIS — Z6841 Body Mass Index (BMI) 40.0 and over, adult: Secondary | ICD-10-CM | POA: Diagnosis not present

## 2018-06-25 DIAGNOSIS — R87610 Atypical squamous cells of undetermined significance on cytologic smear of cervix (ASC-US): Secondary | ICD-10-CM | POA: Diagnosis not present

## 2018-06-29 DIAGNOSIS — F4312 Post-traumatic stress disorder, chronic: Secondary | ICD-10-CM | POA: Diagnosis not present

## 2018-06-29 DIAGNOSIS — F3181 Bipolar II disorder: Secondary | ICD-10-CM | POA: Diagnosis not present

## 2018-06-29 DIAGNOSIS — F411 Generalized anxiety disorder: Secondary | ICD-10-CM | POA: Diagnosis not present

## 2018-06-29 DIAGNOSIS — F5101 Primary insomnia: Secondary | ICD-10-CM | POA: Diagnosis not present

## 2018-06-29 DIAGNOSIS — F332 Major depressive disorder, recurrent severe without psychotic features: Secondary | ICD-10-CM | POA: Diagnosis not present

## 2018-06-29 MED FILL — HYDROCODON-APAP 10-325: 10-325 | 30 days supply | Qty: 90 | Fill #0

## 2018-07-02 ENCOUNTER — Ambulatory Visit (INDEPENDENT_AMBULATORY_CARE_PROVIDER_SITE_OTHER): Payer: Medicare Other

## 2018-07-02 DIAGNOSIS — J301 Allergic rhinitis due to pollen: Secondary | ICD-10-CM | POA: Diagnosis not present

## 2018-07-02 DIAGNOSIS — R82998 Other abnormal findings in urine: Secondary | ICD-10-CM | POA: Diagnosis not present

## 2018-07-02 DIAGNOSIS — R350 Frequency of micturition: Secondary | ICD-10-CM | POA: Diagnosis not present

## 2018-07-07 DIAGNOSIS — F411 Generalized anxiety disorder: Secondary | ICD-10-CM | POA: Diagnosis not present

## 2018-07-07 DIAGNOSIS — F332 Major depressive disorder, recurrent severe without psychotic features: Secondary | ICD-10-CM | POA: Diagnosis not present

## 2018-07-08 ENCOUNTER — Encounter (INDEPENDENT_AMBULATORY_CARE_PROVIDER_SITE_OTHER): Payer: Self-pay | Admitting: Orthopaedic Surgery

## 2018-07-08 ENCOUNTER — Ambulatory Visit (INDEPENDENT_AMBULATORY_CARE_PROVIDER_SITE_OTHER): Payer: Medicare Other | Admitting: Orthopaedic Surgery

## 2018-07-08 ENCOUNTER — Telehealth (INDEPENDENT_AMBULATORY_CARE_PROVIDER_SITE_OTHER): Payer: Self-pay

## 2018-07-08 VITALS — BP 152/91 | HR 78 | Ht 63.0 in | Wt 230.0 lb

## 2018-07-08 DIAGNOSIS — M7711 Lateral epicondylitis, right elbow: Secondary | ICD-10-CM | POA: Diagnosis not present

## 2018-07-08 MED ORDER — BUPIVACAINE HCL 0.5 % IJ SOLN
1.0000 mL | INTRAMUSCULAR | Status: AC | PRN
  Administered 2018-07-08: 1 mL via INTRA_ARTICULAR

## 2018-07-08 MED ORDER — METHYLPREDNISOLONE ACETATE 40 MG/ML IJ SUSP
40.0000 mg | INTRAMUSCULAR | Status: AC | PRN
  Administered 2018-07-08: 40 mg via INTRA_ARTICULAR

## 2018-07-08 MED ORDER — LIDOCAINE HCL 1 % IJ SOLN
0.5000 mL | INTRAMUSCULAR | Status: AC | PRN
  Administered 2018-07-08: .5 mL

## 2018-07-08 NOTE — Telephone Encounter (Signed)
Patient called stating that she received a cortisone injection today and now her right elbow is swollen and she is not able to straighten out her right arm.  Stated she is having right shoulder pain radiating into her right hands and fingers.  Would like to know what she needs to do?  Cb# is [phone].  Please advise.  Thank you.

## 2018-07-08 NOTE — Progress Notes (Signed)
Office Visit Note   Patient: Karina Greer           Date of Birth: 11/26/1965           MRN: [id number] Visit Date: 07/08/2018              Requested by: Cyndi Bender, PA-C [address] [place], [place] [address] PCP: Cyndi Bender, PA-C   Assessment & Plan: Visit Diagnoses:  1. Lateral epicondylitis, right elbow     Plan: Injection performed which she tolerated well.  We discussed pathophysiology of the condition.  Preop MRI back in 2017 did show some narrowing at C4-5 but not as severe as the 2 operative levels.  By exam does not appear that this is related to cervical spine.  If she has persistent problems she will return.  Pathophysiology of lateral epicondylitis discussed and reviewed and r recommendations were made to vary activities.  She states she did cabinet rearranging when she started having increased symptoms and did a lot of lifting and squeezing.  Patient gets hydrocodone 10/325  #  120  Monthly with pain contract supplied by [facility].  No prescriptions applied today.  Follow-Up Instructions: No follow-ups on file.   Orders:  Orders Placed This Encounter  Procedures  . Medium Joint Inj   No orders of the defined types were placed in this encounter.     Procedures: Medium Joint Inj: R lateral epicondyle on 07/08/2018 8:45 AM Indications: pain Details: 25 G 1.5 in needle, anterolateral approach Medications: 1 mL bupivacaine 0.5 %; 40 mg methylPREDNISolone acetate 40 MG/ML; 0.5 mL lidocaine 1 % Outcome: tolerated well, no immediate complications Procedure, treatment alternatives, risks and benefits explained, specific risks discussed. Consent was given by the patient. Immediately prior to procedure a time out was called to verify the correct patient, procedure, equipment, support staff and site/side marked as required. Patient was prepped and draped in the usual sterile fashion.       Clinical Data: No additional findings.   Subjective: Chief  Complaint  Patient presents with  . Right Elbow - Pain, Follow-up    HPI 52 year old female returns with chronic right lateral epicondylitis.  She is use a tennis elbow strap also some Pennsaid topical with slight improvement.  She still has pain with gripping, squeezing.  Sometimes it bothers her at night.  She occasionally has some stiffness in her neck had previous C5-6 C6-7 cervical fusion June 2017 which is healed.  She denies left upper extremity symptoms no fever or chills.  No myelopathic symptoms  Review of Systems he is systems positive for anxiety, obesity, osteoporosis, lumbar spondylosis, previous cervical fusion, history of migraines, depression, cystitis otherwise -14 point systems other than as mentioned in HPI as pertains to her current problem.   Objective: Vital Signs: BP (!) 152/91   Pulse 78   Ht 5\' 3"  (1.6 m)   Wt 230 lb (104.3 kg)   BMI 40.74 kg/m   Physical Exam  Constitutional: She is oriented to person, place, and time. She appears well-developed.  HENT:  Head: Normocephalic.  Right Ear: External ear normal.  Left Ear: External ear normal.  Eyes: Pupils are equal, round, and reactive to light.  Neck: No tracheal deviation present. No thyromegaly present.  Cardiovascular: Normal rate.  Pulmonary/Chest: Effort normal.  Abdominal: Soft.  Neurological: She is alert and oriented to person, place, and time.  Skin: Skin is warm and dry.  Psychiatric: She has a normal mood  and affect. Her behavior is normal.    Ortho Exam well-healed cervical incision.  Good cervical range of motion.  No pain with extension or flexion cervical spine.  No brachial plexus tenderness right or left.  Biceps triceps brachioradialis are 2+ and symmetrical.  Exquisite tenderness with palpation over the lateral epicondyle pain with resisted wrist extension.  Radial nerve in the forearm is normal.  No shoulder impingement.  Specialty Comments:  No specialty comments  available.  Imaging: No results found.   PMFS History: Patient Active Problem List   Diagnosis Date Noted  . LLQ pain 12/03/2017  . Pre-diabetes 06/03/2017  . Rectal bleeding 06/02/2017  . S/P cervical spinal fusion 03/11/2016  . Bilateral hand pain 11/29/2015  . Elevated C-reactive protein 11/29/2015  . Elevated rheumatoid factor 11/29/2015  . Raynaud's phenomenon without gangrene 11/29/2015  . Bloating 09/12/2014  . Dizziness 08/08/2014  . Peri-menopause 08/08/2014  . Chronic tension-type headache, intractable 07/13/2014  . Gastroesophageal reflux disease without esophagitis 07/12/2014  . Anxiety 04/18/2014  . Chronic arthritis 04/18/2014  . Cystitis 04/18/2014  . Morbid obesity ([place]) 04/18/2014  . Fibromyalgia 04/18/2014  . 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 Arthropathy 04/18/2014  . Cephalalgia 04/04/2014  . Cervical pain 04/04/2014  . Numbness and tingling 04/04/2014  . 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 Nonspecific abnormal findings on radiological and other examination of skull and head 02/23/2013  . Other nonspecific abnormal result of function study of brain and central nervous system 07/01/2012  . Disturbance of skin sensation 07/01/2012  . Migraine without aura 07/01/2012  . Abdominal pain 02/11/2012  . Chronic fatigue syndrome 02/11/2012  . Chronic interstitial cystitis 02/11/2012  . Dyspareunia 02/11/2012  . Dysuria 02/11/2012  . Female stress incontinence 02/11/2012  . Irritable bowel syndrome with diarrhea 02/11/2012  . Nausea and vomiting 02/11/2012  . Nocturia 02/11/2012  . Urge incontinence 02/11/2012  . Urinary urgency 02/11/2012  . Lumbar spondylosis 02/04/2012  . Myalgia and myositis 02/04/2012  . Depression 02/04/2012  . Lumbosacral spondylosis without myelopathy 02/04/2012  . OBESITY, UNSPECIFIED 10/03/2010  . GENERALIZED ANXIETY DISORDER 10/03/2010  . CHEST  PAIN, PRECORDIAL 10/03/2010   Past Medical History:  Diagnosis Date  . Anxiety   . Anxiety disorder   . Arthritis   . Chronic low back pain   . Depression   . Dyslipidemia   . Fibromyalgia   . GERD (gastroesophageal reflux disease)   . Headache    hx migraines  . History of kidney stones   . History of panic attacks   . History of renal calculi   . IBS (irritable bowel syndrome)   . Interstitial cystitis   . Lupus ([place])    tested positive for antibodies for lupus. dr to do further tests  . Menorrhagia   . OSA on CPAP    not used cpap for several weeks  . Pneumonia    hx  . PONV (postoperative nausea and vomiting)   . RLS (restless legs syndrome)   . Seasonal asthma   . SI (sacroiliac) joint dysfunction   . SUI (stress urinary incontinence, female)   . UTI (lower urinary tract infection)    hx  . 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 Hypertension Mother   . Diabetes Father   . Brain cancer Father   . Fibromyalgia Sister   . Suicidality  Sister   . Hypertension Other        Cancer, Cerebrovascular disease run on mother side of family    Past Surgical History:  Procedure Laterality Date  . ACHILLES TENDON SURGERY Right 3/16  . ANTERIOR CERVICAL DECOMP/DISCECTOMY FUSION N/A 03/11/2016   Procedure: C5-6, C6-7 Anterior Cervical Discectomy and Fusion, Allograft, Plate;  Surgeon: Marybelle Killings, MD;  Location: [facility];  Service: Orthopedics;  Laterality: N/A;  . CHOLECYSTECTOMY  2014  . COLONOSCOPY WITH PROPOFOL N/A 08/25/2017   Procedure: COLONOSCOPY WITH PROPOFOL;  Surgeon: Manya Silvas, MD;  Location: [facility] ENDOSCOPY;  Service: Endoscopy;  Laterality: N/A;  . CYSTO WITH HYDRODISTENSION N/A 01/26/2014   Procedure: CYSTOSCOPY/HYDRODISTENSION;  Surgeon: Bernestine Amass, MD;  Location: [facility];  Service: Urology;  Laterality: N/A;  . CYSTO/  URETHRAL DILATION/  HYDRODISTENTION/   INSTILLATION  THERAPY  07-16-2010//   12-30-2007//   10-27-2006  . EXERCISE TOLERENCE TEST  10-12-2010   NEGATIVE  ADEQUATE ETT/  NO ISCHEMIA OR [place]IDENCE HIGH GRADE OBSTRUCTIVE CAD/  NO FURTHER TEST NEEDED  . [place] ENDOMETRIAL ABLATION  2014  . LAPAROSCOPIC OVARIAN CYST BX  2005   AND  URETEROSCOPIC LASER LITHO  STONE EXTRACTION  . SHOULDER OPEN ROTATOR CUFF REPAIR Right 2004  . TONSILLECTOMY    . TRANSTHORACIC ECHOCARDIOGRAM  06-07-2006   normal study/  ef 60-65%  . TUBAL LIGATION Bilateral 1995   Social History   Occupational History  . Occupation: Merchandiser, retail: UNEMPLOYED    Comment: Disability  Tobacco Use  . Smoking status: Never Smoker  . Smokeless tobacco: Never Used  Substance and Sexual Activity  . Alcohol use: No  . Drug use: No  . Sexual activity: Not on file

## 2018-07-09 NOTE — Telephone Encounter (Signed)
I called. Work on gentle straightening and apply ice. Should get better.

## 2018-07-09 NOTE — Telephone Encounter (Signed)
Please advise 

## 2018-07-13 DIAGNOSIS — F332 Major depressive disorder, recurrent severe without psychotic features: Secondary | ICD-10-CM | POA: Diagnosis not present

## 2018-07-13 DIAGNOSIS — F411 Generalized anxiety disorder: Secondary | ICD-10-CM | POA: Diagnosis not present

## 2018-07-13 MED FILL — VYVANSE 40 MG CAPSULE: 40 | 30 days supply | Qty: 30 | Fill #0

## 2018-07-16 ENCOUNTER — Ambulatory Visit (INDEPENDENT_AMBULATORY_CARE_PROVIDER_SITE_OTHER): Payer: Medicare Other

## 2018-07-16 DIAGNOSIS — J301 Allergic rhinitis due to pollen: Secondary | ICD-10-CM | POA: Diagnosis not present

## 2018-07-20 ENCOUNTER — Encounter: Payer: Self-pay | Admitting: Physical Medicine & Rehabilitation

## 2018-07-20 ENCOUNTER — Other Ambulatory Visit: Payer: Self-pay

## 2018-07-20 ENCOUNTER — Encounter: Payer: Medicare Other | Attending: Physical Medicine & Rehabilitation | Admitting: Physical Medicine & Rehabilitation

## 2018-07-20 VITALS — BP 126/81 | HR 72 | Ht 63.0 in | Wt 233.4 lb

## 2018-07-20 DIAGNOSIS — M47816 Spondylosis without myelopathy or radiculopathy, lumbar region: Secondary | ICD-10-CM | POA: Diagnosis not present

## 2018-07-20 DIAGNOSIS — R200 Anesthesia of skin: Secondary | ICD-10-CM | POA: Insufficient documentation

## 2018-07-20 DIAGNOSIS — R202 Paresthesia of skin: Secondary | ICD-10-CM | POA: Diagnosis not present

## 2018-07-20 DIAGNOSIS — G609 Hereditary and idiopathic neuropathy, unspecified: Secondary | ICD-10-CM | POA: Diagnosis not present

## 2018-07-20 DIAGNOSIS — M797 Fibromyalgia: Secondary | ICD-10-CM | POA: Diagnosis not present

## 2018-07-20 DIAGNOSIS — F419 Anxiety disorder, unspecified: Secondary | ICD-10-CM | POA: Diagnosis not present

## 2018-07-20 DIAGNOSIS — E039 Hypothyroidism, unspecified: Secondary | ICD-10-CM | POA: Insufficient documentation

## 2018-07-20 MED ORDER — HYDROCODONE-ACETAMINOPHEN 10-325 MG PO TABS: ORAL_TABLET | ORAL | 0 refills | Status: DC

## 2018-07-20 NOTE — Progress Notes (Signed)
Subjective:    Patient ID: Karina Greer, female    DOB: June 05, 1966, 52 y.o.   MRN: [id number]  HPI   Karina Greer is here in follow up of her chronic pain. She has been doing "better" over the last couple months. She has been going to the [facility] which has helped with her perspective and "frame of mind". She sees a psychiatrist as well although she doesn't like the medications she has been prescribed as they made her nauseas.   She continues on the hydrocodone for breakthrough pain 10/325 1 every 8 hours as needed.  She also takes tizanidine for spasms.  She complains of ongoing tingling in her feet and has had problems with her nails breaking routinely as well.  She has tried to exercise more but is often limited by her back.  We had prescribed her outpatient physical therapy back in the winter but then she had a fall and some medical issues including a concussion and that was put on the back burner.  She would like to revisit again.  She does try to walk but the back pain limits her usually after a few minutes.   Pain Inventory Average Pain 8 Pain Right Now 5 My pain is constant and aching  In the last 24 hours, has pain interfered with the following? General activity 5 Relation with others 6 Enjoyment of life 5 What TIME of day is your pain at its worst? all Sleep (in general) Fair  Pain is worse with: walking, bending, sitting, inactivity, standing and some activites Pain improves with: rest, heat/ice, therapy/exercise, pacing activities, medication, TENS and injections Relief from Meds: 7  Mobility walk without assistance how many minutes can you walk? 30 ability to climb steps?  yes do you drive?  yes  Function I need assistance with the following:  meal prep, household duties and shopping  Neuro/Psych dizziness anxiety  Prior Studies Any changes since last visit?  yes injection in elbow  Physicians involved in your care Any changes since last visit?   no   Family History  Problem Relation Age of Onset  . Hypertension Mother   . Diabetes Father   . Brain cancer Father   . Fibromyalgia Sister   . Suicidality Sister   . Hypertension Other        Cancer, Cerebrovascular disease run on mother side of family   Social History   Socioeconomic History  . Marital status: Divorced    Spouse name: Not on file  . Number of children: 3  . Years of education: HS  . Highest education level: 12th grade  Occupational History  . Occupation: Merchandiser, retail: UNEMPLOYED    Comment: Disability  Social Needs  . Financial resource strain: Not on file  . Food insecurity:    Worry: Not on file    Inability: Not on file  . Transportation needs:    Medical: Not on file    Non-medical: Not on file  Tobacco Use  . Smoking status: Never Smoker  . Smokeless tobacco: Never Used  Substance and Sexual Activity  . Alcohol use: No  . Drug use: No  . Sexual activity: Not on file  Lifestyle  . Physical activity:    Days per week: Not on file    Minutes per session: Not on file  . Stress: Not on file  Relationships  . Social connections:    Talks on phone: Not on file    Gets  together: Not on file    Attends religious service: Not on file    Active member of club or organization: Not on file    Attends meetings of clubs or organizations: Not on file    Relationship status: Not on file  Other Topics Concern  . Not on file  Social History Narrative   Patient is right-handed. She avoids caffeine. She has recently been using the treadmill.   Past Surgical History:  Procedure Laterality Date  . ACHILLES TENDON SURGERY Right 3/16  . ANTERIOR CERVICAL DECOMP/DISCECTOMY FUSION N/A 03/11/2016   Procedure: C5-6, C6-7 Anterior Cervical Discectomy and Fusion, Allograft, Plate;  Surgeon: Marybelle Killings, MD;  Location: [facility];  Service: Orthopedics;  Laterality: N/A;  . CHOLECYSTECTOMY  2014  . COLONOSCOPY WITH PROPOFOL N/A 08/25/2017   Procedure:  COLONOSCOPY WITH PROPOFOL;  Surgeon: Manya Silvas, MD;  Location: [facility] ENDOSCOPY;  Service: Endoscopy;  Laterality: N/A;  . CYSTO WITH HYDRODISTENSION N/A 01/26/2014   Procedure: CYSTOSCOPY/HYDRODISTENSION;  Surgeon: Bernestine Amass, MD;  Location: [facility];  Service: Urology;  Laterality: N/A;  . CYSTO/  URETHRAL DILATION/  HYDRODISTENTION/   INSTILLATION THERAPY  07-16-2010//   12-30-2007//   10-27-2006  . EXERCISE TOLERENCE TEST  10-12-2010   NEGATIVE  ADEQUATE ETT/  NO ISCHEMIA OR EVIDENCE HIGH GRADE OBSTRUCTIVE CAD/  NO FURTHER TEST NEEDED  . [facility] ENDOMETRIAL ABLATION  2014  . LAPAROSCOPIC OVARIAN CYST BX  2005   AND  URETEROSCOPIC LASER LITHO  STONE EXTRACTION  . SHOULDER OPEN ROTATOR CUFF REPAIR Right 2004  . TONSILLECTOMY    . TRANSTHORACIC ECHOCARDIOGRAM  06-07-2006   normal study/  ef 60-65%  . TUBAL LIGATION Bilateral 1995   Past Medical History:  Diagnosis Date  . Anxiety   . Anxiety disorder   . Arthritis   . Chronic low back pain   . Depression   . Dyslipidemia   . Fibromyalgia   . GERD (gastroesophageal reflux disease)   . Headache    hx migraines  . History of kidney stones   . History of panic attacks   . History of renal calculi   . IBS (irritable bowel syndrome)   . Interstitial cystitis   . Lupus ([place])    tested positive for antibodies for lupus. dr to do further tests  . Menorrhagia   . OSA on CPAP    not used cpap for several weeks  . Pneumonia    hx  . PONV (postoperative nausea and vomiting)   . RLS (restless legs syndrome)   . Seasonal asthma   . SI (sacroiliac) joint dysfunction   . SUI (stress urinary incontinence, female)   . UTI (lower urinary tract infection)    hx  . 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 02/23/2013   BP 126/81   Pulse 72   Ht 5\' 3"  (1.6 m)   Wt 233 lb 6.4 oz (105.9 kg)   SpO2 97%   BMI 41.34 kg/m   Opioid Risk Score:   Fall Risk Score:  `1  Depression screen PHQ  2/9  Depression screen [facility] 2/9 07/20/2018 06/19/2018 04/20/2018 01/29/2018 01/28/2018 12/15/2017 12/03/2017  Decreased Interest 1 1 0 0 1 2 0  Down, Depressed, Hopeless 1 1 0 0 1 2 0  PHQ - 2 Score 2 2 0 0 2 4 0  Altered sleeping - - - - - - -  Tired, decreased energy - - - - - - -  Change in appetite - - - - - - -  Feeling bad or failure about yourself  - - - - - - -  Trouble concentrating - - - - - - -  Moving slowly or fidgety/restless - - - - - - -  Suicidal thoughts - - - - - - -  PHQ-9 Score - - - - - - -  Difficult doing work/chores - - - - - - -  Some recent data might be hidden     Review of Systems  Constitutional: Positive for appetite change.  HENT: Negative.   Eyes: Negative.   Respiratory: Negative.   Cardiovascular: Negative.   Gastrointestinal: Positive for abdominal pain and nausea.  Endocrine: Negative.   Genitourinary: Negative.   Musculoskeletal: Negative.   Skin: Negative.   Allergic/Immunologic: Negative.   Neurological: Negative.   Hematological: Negative.   Psychiatric/Behavioral: Negative.   All other systems reviewed and are negative.      Objective:   Physical Exam General: No acute distress HEENT: EOMI, oral membranes moist Cards: reg rate  Chest: normal effort Abdomen: Soft, NT, ND Skin: dry, intact Extremities: no edema Musculoskeletal:   TTP over the lumbar paraspinals greater trochanters as well as over numerous tender points.  Extension is limited today more than flexion.  Gait is stable.    Neurological: She is alertand oriented to person, place, and time.  Strength is 5 out of 5 and sensation appears normal Skin: Skin is warmand dry.  Psychiatric:  Overall less anxious.  Very pleasant.      Assessment & Plan:  1.Lumbar spondylosis/facet arthropathy: Hydrocodone 10/325-1 every 8 hours as needed #90.. -We will continue the controlled substance monitoring program, this consists of regular clinic visits, examinations,  routine drug screening, pill counts as well as use of [place] Controlled Substance Reporting System. [place] today.   -Made another referral down to [facility] for outpatient PT to establish range of motion activities and home exercise program. 2. Fibromyalgia.  Revisited the importance of regular aerobic exercise.   3. Anxiety and depression: continue with psych follow up            -I reiterated to her that her anxiety and overall perspective remains her biggest barrier    -continue counseling/treatment at [facility] 4. Migraines: On Maxalt. Headaches are rare now  5. OSA : CPAP.   6. Obesity: Following Healthy Diet Regimen 7. Status Post Cervical Spinal Fusion: C5C6- C-6- C-7 Anterior Cervical Discectomy Fusion and Allograft Plate.: Dr. Lorin Mercy Following -7 months post-op 8. Muscle Spasm: Continue Tizanidine 9.  Extremity tingling and breaking nails: Sent patient for a thyroid panel as well as B12 and folate levels.  15 minutes of face to face patient care time was spent during this visit. All questions were1 encouraged and answered.   F/U in 54months with  NP

## 2018-07-20 NOTE — Patient Instructions (Signed)
PLEASE FEEL FREE TO CALL OUR OFFICE WITH ANY PROBLEMS OR QUESTIONS ([phone])      

## 2018-07-21 LAB — T3: T3, Total: 142 ng/dL (ref 71–180)

## 2018-07-21 LAB — T4, FREE: Free T4: 1.12 ng/dL (ref 0.82–1.77)

## 2018-07-21 LAB — TSH: TSH: 4.94 u[IU]/mL — ABNORMAL HIGH (ref 0.450–4.500)

## 2018-07-21 LAB — B12 AND FOLATE PANEL
Folate: 8.7 ng/mL (ref >3.0)
Vitamin B-12: 795 pg/mL (ref 232–1245)

## 2018-07-21 LAB — T4: T4, Total: 8.6 ug/dL (ref 4.5–12.0)

## 2018-07-23 DIAGNOSIS — L719 Rosacea, unspecified: Secondary | ICD-10-CM | POA: Diagnosis not present

## 2018-07-23 DIAGNOSIS — B078 Other viral warts: Secondary | ICD-10-CM | POA: Diagnosis not present

## 2018-07-23 DIAGNOSIS — L738 Other specified follicular disorders: Secondary | ICD-10-CM | POA: Diagnosis not present

## 2018-07-23 DIAGNOSIS — D2361 Other benign neoplasm of skin of right upper limb, including shoulder: Secondary | ICD-10-CM | POA: Diagnosis not present

## 2018-07-27 DIAGNOSIS — E559 Vitamin D deficiency, unspecified: Secondary | ICD-10-CM | POA: Diagnosis not present

## 2018-07-27 DIAGNOSIS — Z0001 Encounter for general adult medical examination with abnormal findings: Secondary | ICD-10-CM | POA: Diagnosis not present

## 2018-07-27 DIAGNOSIS — F411 Generalized anxiety disorder: Secondary | ICD-10-CM | POA: Diagnosis not present

## 2018-07-27 DIAGNOSIS — D519 Vitamin B12 deficiency anemia, unspecified: Secondary | ICD-10-CM | POA: Diagnosis not present

## 2018-07-27 DIAGNOSIS — R739 Hyperglycemia, unspecified: Secondary | ICD-10-CM | POA: Diagnosis not present

## 2018-07-27 DIAGNOSIS — E782 Mixed hyperlipidemia: Secondary | ICD-10-CM | POA: Diagnosis not present

## 2018-07-27 DIAGNOSIS — Z5181 Encounter for therapeutic drug level monitoring: Secondary | ICD-10-CM | POA: Diagnosis not present

## 2018-07-27 DIAGNOSIS — N189 Chronic kidney disease, unspecified: Secondary | ICD-10-CM | POA: Diagnosis not present

## 2018-07-27 DIAGNOSIS — F332 Major depressive disorder, recurrent severe without psychotic features: Secondary | ICD-10-CM | POA: Diagnosis not present

## 2018-07-27 DIAGNOSIS — F3181 Bipolar II disorder: Secondary | ICD-10-CM | POA: Diagnosis not present

## 2018-07-27 MED FILL — ALPRAZolam 0.25 MG TABS: 0.25 | 30 days supply | Qty: 60 | Fill #0

## 2018-07-27 MED FILL — ARIPiprazole 5 MG TABS: 5 | 30 days supply | Qty: 30 | Fill #0

## 2018-07-29 ENCOUNTER — Ambulatory Visit (INDEPENDENT_AMBULATORY_CARE_PROVIDER_SITE_OTHER): Payer: Medicare Other

## 2018-07-29 DIAGNOSIS — J301 Allergic rhinitis due to pollen: Secondary | ICD-10-CM

## 2018-07-29 DIAGNOSIS — J029 Acute pharyngitis, unspecified: Secondary | ICD-10-CM | POA: Diagnosis not present

## 2018-08-03 ENCOUNTER — Telehealth: Payer: Self-pay | Admitting: Registered Nurse

## 2018-08-03 DIAGNOSIS — F419 Anxiety disorder, unspecified: Secondary | ICD-10-CM

## 2018-08-03 DIAGNOSIS — F411 Generalized anxiety disorder: Secondary | ICD-10-CM | POA: Diagnosis not present

## 2018-08-03 DIAGNOSIS — F332 Major depressive disorder, recurrent severe without psychotic features: Secondary | ICD-10-CM | POA: Diagnosis not present

## 2018-08-03 MED ORDER — HYDROCODONE-ACETAMINOPHEN 10-325 MG PO TABS: ORAL_TABLET | ORAL | 0 refills | Status: DC

## 2018-08-03 MED FILL — HYDROCODON-APAP 10-325: 10-325 | 30 days supply | Qty: 90 | Fill #0

## 2018-08-03 NOTE — Telephone Encounter (Signed)
PMP Reviewed last prescription was filled  on 06/29/2018. New prescription sent. Ms. Barraco arrived in office with problem with Dr. Naaman Plummer prescription. New prescription sent.

## 2018-08-04 DIAGNOSIS — E785 Hyperlipidemia, unspecified: Secondary | ICD-10-CM | POA: Diagnosis not present

## 2018-08-04 DIAGNOSIS — M545 Low back pain: Secondary | ICD-10-CM | POA: Diagnosis not present

## 2018-08-04 DIAGNOSIS — R110 Nausea: Secondary | ICD-10-CM | POA: Diagnosis not present

## 2018-08-04 DIAGNOSIS — L539 Erythematous condition, unspecified: Secondary | ICD-10-CM | POA: Diagnosis not present

## 2018-08-04 DIAGNOSIS — G8929 Other chronic pain: Secondary | ICD-10-CM | POA: Diagnosis not present

## 2018-08-04 DIAGNOSIS — M797 Fibromyalgia: Secondary | ICD-10-CM | POA: Diagnosis not present

## 2018-08-05 DIAGNOSIS — L719 Rosacea, unspecified: Secondary | ICD-10-CM | POA: Diagnosis not present

## 2018-08-05 DIAGNOSIS — L239 Allergic contact dermatitis, unspecified cause: Secondary | ICD-10-CM | POA: Diagnosis not present

## 2018-08-05 DIAGNOSIS — D485 Neoplasm of uncertain behavior of skin: Secondary | ICD-10-CM | POA: Diagnosis not present

## 2018-08-10 DIAGNOSIS — F411 Generalized anxiety disorder: Secondary | ICD-10-CM | POA: Diagnosis not present

## 2018-08-10 DIAGNOSIS — F332 Major depressive disorder, recurrent severe without psychotic features: Secondary | ICD-10-CM | POA: Diagnosis not present

## 2018-08-11 DIAGNOSIS — R079 Chest pain, unspecified: Secondary | ICD-10-CM | POA: Diagnosis not present

## 2018-08-11 DIAGNOSIS — F418 Other specified anxiety disorders: Secondary | ICD-10-CM | POA: Diagnosis not present

## 2018-08-11 DIAGNOSIS — M79604 Pain in right leg: Secondary | ICD-10-CM | POA: Diagnosis not present

## 2018-08-11 DIAGNOSIS — M797 Fibromyalgia: Secondary | ICD-10-CM | POA: Diagnosis not present

## 2018-08-11 DIAGNOSIS — H10829 Rosacea conjunctivitis, unspecified eye: Secondary | ICD-10-CM | POA: Diagnosis not present

## 2018-08-12 ENCOUNTER — Ambulatory Visit (INDEPENDENT_AMBULATORY_CARE_PROVIDER_SITE_OTHER): Payer: Medicare Other | Admitting: Adult Health

## 2018-08-12 ENCOUNTER — Encounter: Payer: Self-pay | Admitting: Adult Health

## 2018-08-12 ENCOUNTER — Ambulatory Visit: Payer: Medicare Other

## 2018-08-12 VITALS — BP 132/65 | HR 62 | Ht 63.0 in | Wt 236.0 lb

## 2018-08-12 DIAGNOSIS — J45909 Unspecified asthma, uncomplicated: Secondary | ICD-10-CM | POA: Diagnosis not present

## 2018-08-12 DIAGNOSIS — J301 Allergic rhinitis due to pollen: Secondary | ICD-10-CM

## 2018-08-12 DIAGNOSIS — R0602 Shortness of breath: Secondary | ICD-10-CM | POA: Diagnosis not present

## 2018-08-12 DIAGNOSIS — G4733 Obstructive sleep apnea (adult) (pediatric): Secondary | ICD-10-CM | POA: Diagnosis not present

## 2018-08-12 DIAGNOSIS — K219 Gastro-esophageal reflux disease without esophagitis: Secondary | ICD-10-CM

## 2018-08-12 DIAGNOSIS — Z9989 Dependence on other enabling machines and devices: Secondary | ICD-10-CM | POA: Diagnosis not present

## 2018-08-12 NOTE — Patient Instructions (Signed)

## 2018-08-12 NOTE — Progress Notes (Signed)
[facility] [place], [place] [address]  Pulmonary Sleep Medicine   Office Visit Note  Patient Name: Karina Greer DOB: 03-16-1966 MRN [id number]  Date of Service: 08/12/2018  Complaints/HPI: Patient here for follow-up on OSA, allergic rhinitis, and moderate intermittent asthma.  She reports that she wears her CPAP most nights.  She currently controls her allergy symptoms with the Flovent and a rescue inhaler of albuterol.  She denies any recent symptoms and states that generally she has been doing well.  Upon chart review pulmonary function test have not been completed in at least one year, these will be scheduled at this visit.  She denies recent hospitalizations.  She also denies any chest pain, shortness of breath, hemoptysis, or fever.  ROS  General: (-) fever, (-) chills, (-) night sweats, (-) weakness Skin: (-) rashes, (-) itching,. Eyes: (-) visual changes, (-) redness, (-) itching. Nose and Sinuses: (-) nasal stuffiness or itchiness, (-) postnasal drip, (-) nosebleeds, (-) sinus trouble. Mouth and Throat: (-) sore throat, (-) hoarseness. Neck: (-) swollen glands, (-) enlarged thyroid, (-) neck pain. Respiratory: - cough, (-) bloody sputum, - shortness of breath, - wheezing. Cardiovascular: - ankle swelling, (-) chest pain. Lymphatic: (-) lymph node enlargement. Neurologic: (-) numbness, (-) tingling. Psychiatric: (-) anxiety, (-) depression   Current Medication: Outpatient Encounter Medications as of 08/12/2018  Medication Sig Note  . albuterol (PROVENTIL HFA;VENTOLIN HFA) 108 (90 BASE) MCG/ACT inhaler Inhale 2 puffs into the lungs every 6 (six) hours as needed.    [name] albuterol (PROVENTIL) (2.5 MG/3ML) 0.083% nebulizer solution INHALE 1 VIAL VIA NEBULIZER EVERY 6 HOURS AS NEEDED   . ALPRAZolam (XANAX) 0.25 MG tablet Take 0.25 mg by mouth 3 (three) times daily as needed for anxiety.   [name] azelastine (ASTELIN) 0.1 % nasal spray SPRAY 2 SPRAYS INTO  EACH NOSTRIL TWICE A DAY   . b complex vitamins tablet Take 1 tablet by mouth daily.   [name] DEXILANT 30 MG capsule Take 1 capsule by mouth 2 (two) times daily.   [name] dicyclomine (BENTYL) 20 MG tablet Take 20 mg by mouth every 6 (six) hours.   [name] EPIPEN 2-PAK 0.3 MG/0.3ML SOAJ injection Use as directed   . FLOVENT HFA 110 MCG/ACT inhaler INHALE 1 PUFF BY MOUTH TWICE A DAY   . HYDROcodone-acetaminophen (NORCO) 10-325 MG tablet TAKE 1 TABLET BY MOUTH EVERY 8 HOURS AS NEEDED FOR PAIN   . meclizine (ANTIVERT) 25 MG tablet Take 1 tablet by mouth 3 (three) times daily as needed.   . montelukast (SINGULAIR) 10 MG tablet Take 10 mg by mouth every morning.   . ondansetron (ZOFRAN-ODT) 4 MG disintegrating tablet Take 1 tablet by mouth 3 (three) times daily as needed.   [name] PRESCRIPTION MEDICATION every 14 (fourteen) days. 2 ALLERGY INJECTIONS EVERY 2 WKS   . sucralfate (CARAFATE) 1 g tablet Take 1 g by mouth 4 (four) times daily -  with meals and at bedtime.   [name] tiZANidine (ZANAFLEX) 2 MG tablet Take 1 tablet (2 mg total) by mouth every 6 (six) hours as needed.   . Vitamin D, Ergocalciferol, (DRISDOL) 50000 units CAPS capsule Take 50,000 Units by mouth every 7 (seven) days. THURSDAYS   . [DISCONTINUED] ALPRAZolam (XANAX) 1 MG tablet TAKE 1/2 TABLET BY MOUTH EVERY MORNING & TAKE 1 TABLET AT BEDTIME 02/25/2018: LD: 02/22/2018  . [DISCONTINUED] amoxicillin-clavulanate (AUGMENTIN) 875-125 MG tablet Take 1 tablet by mouth 2 (two) times daily. (Patient not taking: Reported on 08/12/2018)   . [  DISCONTINUED] VYVANSE 40 MG capsule Take 40 mg by mouth daily.    No facility-administered encounter medications on file as of 08/12/2018.     Surgical History: Past Surgical History:  Procedure Laterality Date  . ACHILLES TENDON SURGERY Right 3/16  . ANTERIOR CERVICAL DECOMP/DISCECTOMY FUSION N/A 03/11/2016   Procedure: C5-6, C6-7 Anterior Cervical Discectomy and Fusion, Allograft, Plate;  Surgeon: Marybelle Killings, MD;  Location:  [facility];  Service: Orthopedics;  Laterality: N/A;  . CHOLECYSTECTOMY  2014  . COLONOSCOPY WITH PROPOFOL N/A 08/25/2017   Procedure: COLONOSCOPY WITH PROPOFOL;  Surgeon: Manya Silvas, MD;  Location: [facility] ENDOSCOPY;  Service: Endoscopy;  Laterality: N/A;  . CYSTO WITH HYDRODISTENSION N/A 01/26/2014   Procedure: CYSTOSCOPY/HYDRODISTENSION;  Surgeon: Bernestine Amass, MD;  Location: [facility];  Service: Urology;  Laterality: N/A;  . CYSTO/  URETHRAL DILATION/  HYDRODISTENTION/   INSTILLATION THERAPY  07-16-2010//   12-30-2007//   10-27-2006  . EXERCISE TOLERENCE TEST  10-12-2010   NEGATIVE  ADEQUATE ETT/  NO ISCHEMIA OR EVIDENCE HIGH GRADE OBSTRUCTIVE CAD/  NO FURTHER TEST NEEDED  . [place] ENDOMETRIAL ABLATION  2014  . LAPAROSCOPIC OVARIAN CYST BX  2005   AND  URETEROSCOPIC LASER LITHO  STONE EXTRACTION  . SHOULDER OPEN ROTATOR CUFF REPAIR Right 2004  . TONSILLECTOMY    . TRANSTHORACIC ECHOCARDIOGRAM  06-07-2006   normal study/  ef 60-65%  . TUBAL LIGATION Bilateral 1995    Medical History: Past Medical History:  Diagnosis Date  . Anxiety   . Anxiety disorder   . Arthritis   . Blepharitis   . Chronic low back pain   . Depression   . Dyslipidemia   . Fibromyalgia   . GERD (gastroesophageal reflux disease)   . Headache    hx migraines  . History of kidney stones   . History of panic attacks   . History of renal calculi   . IBS (irritable bowel syndrome)   . Interstitial cystitis   . Lupus ([place])    tested positive for antibodies for lupus. dr to do further tests  . Menorrhagia   . OSA on CPAP    not used cpap for several weeks  . Pneumonia    hx  . PONV (postoperative nausea and vomiting)   . RLS (restless legs syndrome)   . Rosacea   . Seasonal asthma   . SI (sacroiliac) joint dysfunction   . SUI (stress urinary incontinence, female)   . UTI (lower urinary tract infection)    hx  . 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 02/23/2013     Family History: Family History  Problem Relation Age of Onset  . Hypertension Mother   . Diabetes Father   . Brain cancer Father   . Fibromyalgia Sister   . Suicidality Sister   . Hypertension Other        Cancer, Cerebrovascular disease run on mother side of family    Social History: Social History   Socioeconomic History  . Marital status: Divorced    Spouse name: Not on file  . Number of children: 3  . Years of education: HS  . Highest education level: 12th grade  Occupational History  . Occupation: Merchandiser, retail: UNEMPLOYED    Comment: Disability  Social Needs  . Financial resource strain: Not on file  . Food insecurity:    Worry: Not on file    Inability: Not on file  .  Transportation needs:    Medical: Not on file    Non-medical: Not on file  Tobacco Use  . Smoking status: Never Smoker  . Smokeless tobacco: Never Used  Substance and Sexual Activity  . Alcohol use: No  . Drug use: No  . Sexual activity: Not on file  Lifestyle  . Physical activity:    Days per week: Not on file    Minutes per session: Not on file  . Stress: Not on file  Relationships  . Social connections:    Talks on phone: Not on file    Gets together: Not on file    Attends religious service: Not on file    Active member of club or organization: Not on file    Attends meetings of clubs or organizations: Not on file    Relationship status: Not on file  . Intimate partner violence:    Fear of current or ex partner: Not on file    Emotionally abused: Not on file    Physically abused: Not on file    Forced sexual activity: Not on file  Other Topics Concern  . Not on file  Social History Narrative   Patient is right-handed. She avoids caffeine. She has recently been using the treadmill.    Vital Signs: Blood pressure 132/65, pulse 62, height 5\' 3"  (1.6 m), weight 236 lb (107 kg), SpO2 97 %.  Examination: General Appearance: The patient is well-developed,  well-nourished, and in no distress. Skin: Gross inspection of skin unremarkable. Head: normocephalic, no gross deformities. Eyes: no gross deformities noted. ENT: ears appear grossly normal no exudates. Neck: Supple. No thyromegaly. No LAD. Respiratory: Clear to auscultation bilat. Cardiovascular: Normal S1 and S2 without murmur or rub. Extremities: No cyanosis. pulses are equal. Neurologic: Alert and oriented. No involuntary movements.  LABS: Recent Results (from the past 2160 hour(s))  Drug Tox Monitor 1 w/Conf, Oral Fluid     Status: Abnormal   Collection Time: 05/15/18 10:13 AM  Result Value Ref Range   Amphetamines NEGATIVE <10 ng/mL   Barbiturates NEGATIVE <10 ng/mL   Benzodiazepines NEGATIVE <0.50 ng/mL   Buprenorphine NEGATIVE <0.10 ng/mL   Cocaine NEGATIVE <5.0 ng/mL   Fentanyl NEGATIVE <0.10 ng/mL   Heroin Metabolite NEGATIVE <1.0 ng/mL   MARIJUANA NEGATIVE <2.5 ng/mL   MDMA NEGATIVE <10 ng/mL   Meprobamate NEGATIVE <2.5 ng/mL   Methadone NEGATIVE <5.0 ng/mL   Nicotine Metabolite NEGATIVE <5.0 ng/mL   Opiates POSITIVE (A) <2.5 ng/mL   Codeine Negative <2.5 ng/mL   Dihydrocodeine 3.3 (H) <2.5 ng/mL    Comment: . Dihydrocodeine is a minor metabolite of hydrocodone as well as a prescribed drug.    Hydrocodone 44.4 (H) <2.5 ng/mL    Comment: . Hydrocodone is a minor metabolite of codeine as well as prescribed drug. Hydrocodone is a pharmaceutical impurity of oxycodone (up to 1%). [name]    Hydromorphone Negative <2.5 ng/mL   Morphine Negative <2.5 ng/mL   Norhydrocodone Negative <2.5 ng/mL   Noroxycodone Negative <2.5 ng/mL   Oxycodone Negative <2.5 ng/mL   Oxymorphone Negative <2.5 ng/mL   Phencyclidine NEGATIVE <10 ng/mL   Tapentadol NEGATIVE <5.0 ng/mL   Tramadol NEGATIVE <5.0 ng/mL   Zolpidem NEGATIVE <5.0 ng/mL    Comment: . For additional information, please refer to http://education.[facility].com/faq/FAQ186 (This link is being provided for  informational/ educational purposes only.) . This drug testing is for medical treatment only. Analysis was performed as non-forensic testing and these results should be used only by  healthcare providers to render diagnosis or treatment, or to monitor progress of medical conditions. . For assistance with interpreting these drug results, please contact a [employer] Toxicology Specialist: [phone] [place] [phone]), M-F, 8am-6pm EST. [name] These tests were developed and their analytical performance characteristics have been determined by [facility]. They have not been cleared or approved by the FDA. These assays have been validated pursuant to the CLIA regulations and are used for clinical purposes.   Drug Tox Alc Metab w/Con, Oral Fluid     Status: None   Collection Time: 05/15/18 10:13 AM  Result Value Ref Range   Alcohol Metabolite NEGATIVE <25 ng/mL    Comment: . For additional information, please refer to http://education.[facility].com/faq/FAQ183 (This link is being provided for informational/ educational purposes only.) . This drug testing is for medical treatment only. Analysis was performed as non-forensic testing and these results should be used only by healthcare providers to render diagnosis or treatment, or to monitor progress of medical conditions. . For assistance with interpreting these drug results, please contact a [employer] Toxicology Specialist: [phone] [place] [phone]), M-F, 8am-6pm EST. [name] These tests were developed and their analytical performance characteristics have been determined by [facility]. They have not been cleared or approved by the FDA. These assays have been validated pursuant to the CLIA regulations and are used for clinical purposes.   T4, free     Status: None   Collection Time: 07/20/18 10:27 AM  Result Value Ref Range   Free T4 1.12 0.82 - 1.77 ng/dL  TSH     Status: Abnormal    Collection Time: 07/20/18 10:27 AM  Result Value Ref Range   TSH 4.940 (H) 0.450 - 4.500 uIU/mL  T3     Status: None   Collection Time: 07/20/18 10:27 AM  Result Value Ref Range   T3, Total 142 71 - 180 ng/dL  T4     Status: None   Collection Time: 07/20/18 10:27 AM  Result Value Ref Range   T4, Total 8.6 4.5 - 12.0 ug/dL  B12 and Folate Panel     Status: None   Collection Time: 07/20/18 10:27 AM  Result Value Ref Range   Vitamin B-12 795 232 - 1,245 pg/mL   Folate 8.7 >3.0 ng/mL    Comment: A serum folate concentration of less than 3.1 ng/mL is considered to represent clinical deficiency.     Radiology: Mr Brain Wo Contrast  Result Date: 02/14/2018 CLINICAL DATA:  Vertigo with nausea for 1 year. Concussion 3 weeks ago. Headaches. EXAM: MRI HEAD WITHOUT CONTRAST TECHNIQUE: Multiplanar, multiecho pulse sequences of the brain and surrounding structures were obtained without intravenous contrast. COMPARISON:  None. FINDINGS: Brain: No evidence for acute infarction, hemorrhage, mass lesion, hydrocephalus, or extra-axial fluid. Normal for age cerebral volume. Moderately advanced subcortical greater than periventricular white matter signal abnormality, frontal lobe predominant, nonspecific. Chronic microvascular ischemic change is favored. Complicated migraine, vasculitis, chronic infection, or demyelinating disease are other considerations. Vascular: Flow voids are maintained throughout the carotid, basilar, and vertebral arteries. There are no areas of chronic hemorrhage. No posttraumatic sequelae are evident. Skull and upper cervical spine: Normal marrow signal. Upper cervical region unremarkable. Incompletely visualized C5-C7 fusion. Sinuses/Orbits: No significant sinus opacity.  Negative orbits. Other: No mastoid fluid.  Extracranial soft tissues unremarkable. IMPRESSION: Nonspecific, but abnormal, subcortical greater than periventricular white matter signal abnormality. No large  vessel/cortical infarct. Etiology is unclear. No posttraumatic sequelae are evident. Electronically Signed   By: Staci Righter  M.D.   On: 02/14/2018 14:16    No results found.  No results found.    Assessment and Plan: Patient Active Problem List   Diagnosis Date Noted  . Hypothyroidism 07/20/2018  . LLQ pain 12/03/2017  . Pre-diabetes 06/03/2017  . Rectal bleeding 06/02/2017  . S/P cervical spinal fusion 03/11/2016  . Bilateral hand pain 11/29/2015  . Elevated C-reactive protein 11/29/2015  . Elevated rheumatoid factor 11/29/2015  . Raynaud's phenomenon without gangrene 11/29/2015  . Bloating 09/12/2014  . Dizziness 08/08/2014  . Peri-menopause 08/08/2014  . Chronic tension-type headache, intractable 07/13/2014  . Gastroesophageal reflux disease without esophagitis 07/12/2014  . Anxiety 04/18/2014  . Chronic arthritis 04/18/2014  . Cystitis 04/18/2014  . Morbid obesity ([place]) 04/18/2014  . Fibromyalgia 04/18/2014  . 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 Arthropathy 04/18/2014  . Cephalalgia 04/04/2014  . Cervical pain 04/04/2014  . Numbness and tingling 04/04/2014  . 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 Nonspecific abnormal findings on radiological and other examination of skull and head 02/23/2013  . Other nonspecific abnormal result of function study of brain and central nervous system 07/01/2012  . Disturbance of skin sensation 07/01/2012  . Migraine without aura 07/01/2012  . Abdominal pain 02/11/2012  . Chronic fatigue syndrome 02/11/2012  . Chronic interstitial cystitis 02/11/2012  . Dyspareunia 02/11/2012  . Dysuria 02/11/2012  . Female stress incontinence 02/11/2012  . Irritable bowel syndrome with diarrhea 02/11/2012  . Nausea and vomiting 02/11/2012  . Nocturia 02/11/2012  . Urge incontinence 02/11/2012  . Urinary urgency 02/11/2012  . Lumbar spondylosis 02/04/2012  . Myalgia  and myositis 02/04/2012  . Depression 02/04/2012  . Lumbosacral spondylosis without myelopathy 02/04/2012  . OBESITY, UNSPECIFIED 10/03/2010  . GENERALIZED ANXIETY DISORDER 10/03/2010  . CHEST PAIN, PRECORDIAL 10/03/2010   1. OSA on CPAP Encouraged continued compliance with CPAP.  Patient verbalized understanding.  2. Allergic rhinitis due to pollen, unspecified seasonality Patient having good results currently with Singulair.  She should continue this medication as discussed.  3. SOB (shortness of breath) FEV1/FVC is currently measured at 105% of the pre-predicted value on today's spirometry. - Spirometry with Graph  4. Gastroesophageal reflux disease, esophagitis presence not specified Stable on Dexilant currently denies any issues.  Continue taking medication as prescribed.  5. Morbid obesity ([place]) Obesity Counseling: Risk Assessment: An assessment of behavioral risk factors was made today and includes lack of exercise sedentary lifestyle, lack of portion control and poor dietary habits.  Risk Modification Advice: She was counseled on portion control guidelines. Restricting daily caloric intake to. . The detrimental long term effects of obesity on her health and ongoing poor compliance was also discussed with the patient.  6. Moderate asthma, unspecified whether complicated, unspecified whether persistent Discussed pulmonary function test with patient new PFTs ordered at this visit. - Pulmonary Function Test; Future   General Counseling: I have discussed the findings of the evaluation and examination with Levada Dy.  I have also discussed any further diagnostic evaluation thatmay be needed or ordered today. Elise verbalizes understanding of the findings of todays visit. We also reviewed her medications today and discussed drug interactions and side effects including but not limited excessive drowsiness and altered mental states. We also discussed that there is always a risk not just  to her but also people around her. she has been encouraged to call the office with any questions or concerns that should arise related to todays visit.  Time spent: 25 This patient was seen by Orson Gear AGNP-C in Collaboration with Dr. Devona Konig as a part of collaborative care agreement.   I have personally obtained a history, examined the patient, evaluated laboratory and imaging results, formulated the assessment and plan and placed orders.    Allyne Gee, MD [facility] Pulmonary and Critical Care Sleep medicine

## 2018-08-17 ENCOUNTER — Encounter: Payer: Self-pay | Admitting: Registered Nurse

## 2018-08-17 ENCOUNTER — Ambulatory Visit: Payer: Self-pay | Admitting: Adult Health

## 2018-08-17 ENCOUNTER — Encounter: Payer: Medicare Other | Attending: Physical Medicine & Rehabilitation | Admitting: Registered Nurse

## 2018-08-17 VITALS — BP 138/85 | HR 69 | Ht 63.0 in | Wt 236.0 lb

## 2018-08-17 DIAGNOSIS — G894 Chronic pain syndrome: Secondary | ICD-10-CM | POA: Diagnosis not present

## 2018-08-17 DIAGNOSIS — Z5181 Encounter for therapeutic drug level monitoring: Secondary | ICD-10-CM

## 2018-08-17 DIAGNOSIS — R200 Anesthesia of skin: Secondary | ICD-10-CM | POA: Diagnosis not present

## 2018-08-17 DIAGNOSIS — G43001 Migraine without aura, not intractable, with status migrainosus: Secondary | ICD-10-CM | POA: Diagnosis not present

## 2018-08-17 DIAGNOSIS — F411 Generalized anxiety disorder: Secondary | ICD-10-CM | POA: Diagnosis not present

## 2018-08-17 DIAGNOSIS — F3289 Other specified depressive episodes: Secondary | ICD-10-CM | POA: Diagnosis not present

## 2018-08-17 DIAGNOSIS — M62838 Other muscle spasm: Secondary | ICD-10-CM

## 2018-08-17 DIAGNOSIS — M797 Fibromyalgia: Secondary | ICD-10-CM | POA: Insufficient documentation

## 2018-08-17 DIAGNOSIS — R202 Paresthesia of skin: Secondary | ICD-10-CM | POA: Diagnosis not present

## 2018-08-17 DIAGNOSIS — F419 Anxiety disorder, unspecified: Secondary | ICD-10-CM

## 2018-08-17 DIAGNOSIS — Z79899 Other long term (current) drug therapy: Secondary | ICD-10-CM

## 2018-08-17 DIAGNOSIS — M7061 Trochanteric bursitis, right hip: Secondary | ICD-10-CM | POA: Diagnosis not present

## 2018-08-17 DIAGNOSIS — F332 Major depressive disorder, recurrent severe without psychotic features: Secondary | ICD-10-CM | POA: Diagnosis not present

## 2018-08-17 DIAGNOSIS — M47816 Spondylosis without myelopathy or radiculopathy, lumbar region: Secondary | ICD-10-CM

## 2018-08-17 MED ORDER — HYDROCODONE-ACETAMINOPHEN 10-325 MG PO TABS: ORAL_TABLET | ORAL | 0 refills | Status: DC

## 2018-08-17 NOTE — Progress Notes (Signed)
Subjective:    Patient ID: Karina Greer, female    DOB: 1966-04-21, 52 y.o.   MRN: [id number]  HPI: Karina Greer is a 52 y.o. female who returns for follow up appointment for chronic pain and medication refill. She states her  pain is located in her in mid to lower back and right hip. She rates her pain 7..Her current exercise regime is walking and performing stretching exercises.  Ms. Amberg Morphine equivalent is 30.00  MME.She is also prescribed Alprazolam by Lorelle Gibbs NP.We have discussed the black box warning of using opioids and benzodiazepines.  I highlighted the dangers of using these drugs together and discussed the adverse events including respiratory suppression, overdose, cognitive impairment and importance of compliance with current regimen. We will continue to monitor and adjust as indicated.  She is being closely monitored and under the care of he psychiatrist at the [facility].   Last Oral Swab was Performed on 05/15/2018. It was consistent.  Pain Inventory Average Pain 8 Pain Right Now 7 My pain is constant, stabbing and aching  In the last 24 hours, has pain interfered with the following? General activity 7 Relation with others 8 Enjoyment of life 9 What TIME of day is your pain at its worst? all Sleep (in general) Fair  Pain is worse with: walking, bending, sitting, inactivity, standing, unsure and some activites Pain improves with: rest, heat/ice, therapy/exercise, pacing activities, medication and TENS Relief from Meds: 6  Mobility walk without assistance ability to climb steps?  yes do you drive?  yes  Function disabled: date disabled . I need assistance with the following:  meal prep, household duties and shopping  Neuro/Psych dizziness anxiety  Prior Studies Any changes since last visit?  no  Physicians involved in your care Any changes since last visit?  no   Family History  Problem Relation Age of Onset  . Hypertension Mother   .  Diabetes Father   . Brain cancer Father   . Fibromyalgia Sister   . Suicidality Sister   . Hypertension Other        Cancer, Cerebrovascular disease run on mother side of family   Social History   Socioeconomic History  . Marital status: Divorced    Spouse name: Not on file  . Number of children: 3  . Years of education: HS  . Highest education level: 12th grade  Occupational History  . Occupation: Merchandiser, retail: UNEMPLOYED    Comment: Disability  Social Needs  . Financial resource strain: Not on file  . Food insecurity:    Worry: Not on file    Inability: Not on file  . Transportation needs:    Medical: Not on file    Non-medical: Not on file  Tobacco Use  . Smoking status: Never Smoker  . Smokeless tobacco: Never Used  Substance and Sexual Activity  . Alcohol use: No  . Drug use: No  . Sexual activity: Not on file  Lifestyle  . Physical activity:    Days per week: Not on file    Minutes per session: Not on file  . Stress: Not on file  Relationships  . Social connections:    Talks on phone: Not on file    Gets together: Not on file    Attends religious service: Not on file    Active member of club or organization: Not on file    Attends meetings of clubs or organizations: Not on file  Relationship status: Not on file  Other Topics Concern  . Not on file  Social History Narrative   Patient is right-handed. She avoids caffeine. She has recently been using the treadmill.   Past Surgical History:  Procedure Laterality Date  . ACHILLES TENDON SURGERY Right 3/16  . ANTERIOR CERVICAL DECOMP/DISCECTOMY FUSION N/A 03/11/2016   Procedure: C5-6, C6-7 Anterior Cervical Discectomy and Fusion, Allograft, Plate;  Surgeon: Marybelle Killings, MD;  Location: [place];  Service: Orthopedics;  Laterality: N/A;  . CHOLECYSTECTOMY  2014  . COLONOSCOPY WITH PROPOFOL N/A 08/25/2017   Procedure: COLONOSCOPY WITH PROPOFOL;  Surgeon: Manya Silvas, MD;  Location[facility] ENDOSCOPY;   Service: Endoscopy;  Laterality: N/A;  . CYSTO WITH HYDRODISTENSION N/A 01/26/2014   Procedure: CYSTOSCOPY/HYDRODISTENSION;  Surgeon: Bernestine Amass, MD;  Location: [facility];  Service: Urology;  Laterality: N/A;  . CYSTO/  URETHRAL DILATION/  HYDRODISTENTION/   INSTILLATION THERAPY  07-16-2010//   12-30-2007//   10-27-2006  . EXERCISE TOLERENCE TEST  10-12-2010   NEGATIVE  ADEQUATE ETT/  NO ISCHEMIA OR EVIDENCE HIGH GRADE OBSTRUCTIVE CAD/  NO FURTHER TEST NEEDED  . [place] ENDOMETRIAL ABLATION  2014  . LAPAROSCOPIC OVARIAN CYST BX  2005   AND  URETEROSCOPIC LASER LITHO  STONE EXTRACTION  . SHOULDER OPEN ROTATOR CUFF REPAIR Right 2004  . TONSILLECTOMY    . TRANSTHORACIC ECHOCARDIOGRAM  06-07-2006   normal study/  ef 60-65%  . TUBAL LIGATION Bilateral 1995   Past Medical History:  Diagnosis Date  . Anxiety   . Anxiety disorder   . Arthritis   . Blepharitis   . Chronic low back pain   . Depression   . Dyslipidemia   . Fibromyalgia   . GERD (gastroesophageal reflux disease)   . Headache    hx migraines  . History of kidney stones   . History of panic attacks   . History of renal calculi   . IBS (irritable bowel syndrome)   . Interstitial cystitis   . Lupus ([place])    tested positive for antibodies for lupus. dr to do further tests  . Menorrhagia   . OSA on CPAP    not used cpap for several weeks  . Pneumonia    hx  . PONV (postoperative nausea and vomiting)   . RLS (restless legs syndrome)   . Rosacea   . Seasonal asthma   . SI (sacroiliac) joint dysfunction   . SUI (stress urinary incontinence, female)   . UTI (lower urinary tract infection)    hx  . 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 02/23/2013   BP 138/85   Pulse 69   Ht 5\' 3"  (1.6 m)   Wt 236 lb (107 kg)   SpO2 97%   BMI 41.81 kg/m   Opioid Risk Score:   Fall Risk Score:  `1  Depression screen PHQ 2/9  Depression screen [employer] 2/9 08/12/2018 07/20/2018 06/19/2018  04/20/2018 01/29/2018 01/28/2018 12/15/2017  Decreased Interest 0 1 1 0 0 1 2  Down, Depressed, Hopeless 0 1 1 0 0 1 2  PHQ - 2 Score 0 2 2 0 0 2 4  Altered sleeping - - - - - - -  Tired, decreased energy - - - - - - -  Change in appetite - - - - - - -  Feeling bad or failure about yourself  - - - - - - -  Trouble concentrating - - - - - - -  Moving slowly or fidgety/restless - - - - - - -  Suicidal thoughts - - - - - - -  PHQ-9 Score - - - - - - -  Difficult doing work/chores - - - - - - -  Some recent data might be hidden  '  Review of Systems  Constitutional: Negative.   HENT: Negative.   Eyes: Negative.   Respiratory: Negative.   Cardiovascular: Negative.   Gastrointestinal: Negative.   Endocrine: Negative.   Genitourinary: Negative.   Musculoskeletal: Positive for arthralgias, back pain and myalgias.  Skin: Negative.   Allergic/Immunologic: Negative.   Neurological: Positive for dizziness.  Hematological: Negative.   Psychiatric/Behavioral: The patient is nervous/anxious.   All other systems reviewed and are negative.      Objective:   Physical Exam  Constitutional: She is oriented to person, place, and time. She appears well-developed and well-nourished.  HENT:  Head: Normocephalic and atraumatic.  Neck: Normal range of motion. Neck supple.  Cardiovascular: Normal rate and regular rhythm.  Pulmonary/Chest: Effort normal and breath sounds normal.  Musculoskeletal:  Normal Muscle Bulk and Muscle Testing Reveals: Upper Extremities: Full ROM and Muscle Strength 5/5 Thoracic Paraspinal Tenderness: T-11-T-12 Lumbar Paraspinal Tenderness: L-3-L-5 Lower Extremities: Full ROM and Muscle Strength 5/5 Arises from Table with ease Narrow Based Gait  Neurological: She is alert and oriented to person, place, and time.  Skin: Skin is warm and dry.  Psychiatric: She has a normal mood and affect. Her behavior is normal.  Nursing note and vitals reviewed.         Assessment  & Plan:  1. Lumbago/ Lumbar Spondylosis/ Right Lumbar Radiculitis: Continue to Monitor.08/17/2018 Refilled: HYDROcodone 10/325mg  one tablet every 8 hours as needed #90.  We will continue the opioid monitoring program, this consists of regular clinic visits, examinations, urine drug screen, pill counts as well as use of [place] Controlled Substance Reporting System. 2. Fibromyalgia. Continue Current exercise Regime.08/17/2018 3. Anxiety and depression:Psychiatry Following: Dr.Jessicaat [facility]. Continue Counseling at [facility]. On Xanax,08/17/2018 4. Migraines: On Maxalt. Neurology Following.08/17/2018 5. OSA : Continue exercise regime as tolerated and losing weight.08/17/2018 6. Obesity: Following Healthy Diet Regimen.08/17/2018 7. Status Post Cervical Spinal Fusion: C5C6- C-6- C-7 Anterior Cervical Discectomy Fusion and Allograft Plate:Dr. Lorin Mercy Following.08/17/2018 8. Cervicalgia/ Cervical Radiculitis/ S/P Cervical Spinal Fusion: Continueto MonitorDr. Yates Following. 08/17/2018 9. Muscle Spasm: Continue Tizanidine as needed.08/17/2018 10.Hereditary and Idiopathic Peripheral Neuropathy Continue with Tens Unit. 06/19/2018 11.Right Greater Trochanteric Bursitis: Continue to Alternate Ice and eat Therapy. Continue to Monitor. 08/17/2018.  20 minutes of face to face patient care time was spent during this visit. All questions were encouraged and answered.   F/U in 92month

## 2018-08-24 DIAGNOSIS — F332 Major depressive disorder, recurrent severe without psychotic features: Secondary | ICD-10-CM | POA: Diagnosis not present

## 2018-08-24 DIAGNOSIS — F411 Generalized anxiety disorder: Secondary | ICD-10-CM | POA: Diagnosis not present

## 2018-08-24 DIAGNOSIS — F3181 Bipolar II disorder: Secondary | ICD-10-CM | POA: Diagnosis not present

## 2018-08-24 MED FILL — ROSUVASTATIN CALCIUM 10 MG: 10 | 30 days supply | Qty: 30 | Fill #0

## 2018-08-24 MED FILL — JANUMET XR 100-1,000 MG TAB: 100-1000 | 30 days supply | Qty: 30 | Fill #0

## 2018-08-24 MED FILL — ALPRAZolam 0.25 MG TABS: 0.25 | 30 days supply | Qty: 60 | Fill #0

## 2018-08-25 ENCOUNTER — Encounter (INDEPENDENT_AMBULATORY_CARE_PROVIDER_SITE_OTHER): Payer: Self-pay | Admitting: Orthopaedic Surgery

## 2018-08-25 ENCOUNTER — Ambulatory Visit (INDEPENDENT_AMBULATORY_CARE_PROVIDER_SITE_OTHER): Payer: Medicare Other | Admitting: Orthopaedic Surgery

## 2018-08-25 VITALS — BP 134/90 | HR 77 | Ht 63.0 in | Wt 230.0 lb

## 2018-08-25 DIAGNOSIS — Z981 Arthrodesis status: Secondary | ICD-10-CM

## 2018-08-25 DIAGNOSIS — G5621 Lesion of ulnar nerve, right upper limb: Secondary | ICD-10-CM | POA: Diagnosis not present

## 2018-08-25 NOTE — Progress Notes (Signed)
Office Visit Note   Patient: Karina Greer           Date of Birth: 06/23/1966           MRN: [id number] Visit Date: 08/25/2018              Requested by: Cyndi Bender, PA-C [address] [place], [place] [address] PCP: Cyndi Bender, PA-C   Assessment & Plan: Visit Diagnoses:  1. Cubital tunnel syndrome on right   2. S/P cervical spinal fusion     Plan: We will set up for some nerve conduction velocities EMGs to rule out right cubital tunnel syndrome.  For follow-up after test.  Follow-Up Instructions: Follow-up post NCV, EMG  Orders:  No orders of the defined types were placed in this encounter.  No orders of the defined types were placed in this encounter.     Procedures: No procedures performed   Clinical Data: No additional findings.   Subjective: Chief Complaint  Patient presents with  . Right Elbow - Pain  . Right Wrist - Pain    HPI 52 year old female returns she is had some pain in her right lateral elbow she had a lateral epicondylar injection 07/08/2018 which helped for 2 days.  She peeled a couple bags of potatoes had increased pain is having pain over the ulnar nerve and states she is having numbness primarily ulnar fingers of the right hand.  No associated neck pain.  Previous to level cervical fusion 2017 at C5-6 C6-7 with good relief of preop symptoms.  She is noticed tingling and weakness with gripping.  She has some pain and swelling over the ulnar aspect of her wrist overlying the FCU and states after ibuprofen for a few days is only now just started to go down.  She is had some increase in her blood pressure she is on some Januvia.  She is also taking Crestor for her high cholesterol.  No history of upper extremity fracture when she was younger.  She notices decreased grip strength and is dropped some objects that she has to squeeze hard.  She wakes up at night with numbness in the ulnar distribution right hand with elbow and maximum  flexion.S  Review of Systems updated unchanged from 07/08/2018 office visit 14 point update.   Objective: Vital Signs: BP 134/90   Pulse 77   Ht 5\' 3"  (1.6 m)   Wt 230 lb (104.3 kg)   BMI 40.74 kg/m   Physical Exam  Constitutional: She is oriented to person, place, and time. She appears well-developed.  HENT:  Head: Normocephalic.  Right Ear: External ear normal.  Left Ear: External ear normal.  Eyes: Pupils are equal, round, and reactive to light.  Neck: No tracheal deviation present. No thyromegaly present.  Cardiovascular: Normal rate.  Pulmonary/Chest: Effort normal.  Abdominal: Soft.  Neurological: She is alert and oriented to person, place, and time.  Skin: Skin is warm and dry.  Psychiatric: She has a normal mood and affect. Her behavior is normal.    Ortho Exam positive Tinel's over right cubital tunnel she has some bilateral cubitus valgus which is symmetrical.  She has increased pain over the cubital tunnel with elbow flexion wrist extension holding that position x60 seconds.  Interossei are strong slight weakness of FCU tenderness over the piriformis and flexor carpi ulnaris tendon on the right.  Negative Tinel's over the left well-healed cervical incision.  Specialty Comments:  No specialty comments available.  Imaging: No results  found.   PMFS History: Patient Active Problem List   Diagnosis Date Noted  . Hypothyroidism 07/20/2018  . LLQ pain 12/03/2017  . Pre-diabetes 06/03/2017  . Rectal bleeding 06/02/2017  . S/P cervical spinal fusion 03/11/2016  . Bilateral hand pain 11/29/2015  . Elevated C-reactive protein 11/29/2015  . Elevated rheumatoid factor 11/29/2015  . Raynaud's phenomenon without gangrene 11/29/2015  . Bloating 09/12/2014  . Dizziness 08/08/2014  . Peri-menopause 08/08/2014  . Chronic tension-type headache, intractable 07/13/2014  . Gastroesophageal reflux disease without esophagitis 07/12/2014  . Anxiety 04/18/2014  . Chronic  arthritis 04/18/2014  . Cystitis 04/18/2014  . Morbid obesity ([place]) 04/18/2014  . Fibromyalgia 04/18/2014  . 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 Arthropathy 04/18/2014  . Cephalalgia 04/04/2014  . Numbness and tingling 04/04/2014  . 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 Nonspecific abnormal findings on radiological and other examination of skull and head 02/23/2013  . Other nonspecific abnormal result of function study of brain and central nervous system 07/01/2012  . Disturbance of skin sensation 07/01/2012  . Migraine without aura 07/01/2012  . Abdominal pain 02/11/2012  . Chronic fatigue syndrome 02/11/2012  . Chronic interstitial cystitis 02/11/2012  . Dyspareunia 02/11/2012  . Dysuria 02/11/2012  . Female stress incontinence 02/11/2012  . Irritable bowel syndrome with diarrhea 02/11/2012  . Nausea and vomiting 02/11/2012  . Nocturia 02/11/2012  . Urge incontinence 02/11/2012  . Urinary urgency 02/11/2012  . Lumbar spondylosis 02/04/2012  . Myalgia and myositis 02/04/2012  . Depression 02/04/2012  . Lumbosacral spondylosis without myelopathy 02/04/2012  . OBESITY, UNSPECIFIED 10/03/2010  . GENERALIZED ANXIETY DISORDER 10/03/2010  . CHEST PAIN, PRECORDIAL 10/03/2010   Past Medical History:  Diagnosis Date  . Anxiety   . Anxiety disorder   . Arthritis   . Blepharitis   . Chronic low back pain   . Depression   . Dyslipidemia   . Fibromyalgia   . GERD (gastroesophageal reflux disease)   . Headache    hx migraines  . History of kidney stones   . History of panic attacks   . History of renal calculi   . IBS (irritable bowel syndrome)   . Interstitial cystitis   . Lupus ([place])    tested positive for antibodies for lupus. dr to do further tests  . Menorrhagia   . OSA on CPAP    not used cpap for several weeks  . Pneumonia    hx  . PONV (postoperative nausea and vomiting)   . RLS  (restless legs syndrome)   . Rosacea   . Seasonal asthma   . SI (sacroiliac) joint dysfunction   . SUI (stress urinary incontinence, female)   . UTI (lower urinary tract infection)    hx  . 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 Hypertension Mother   . Diabetes Father   . Brain cancer Father   . Fibromyalgia Sister   . Suicidality Sister   . Hypertension Other        Cancer, Cerebrovascular disease run on mother side of family    Past Surgical History:  Procedure Laterality Date  . ACHILLES TENDON SURGERY Right 3/16  . ANTERIOR CERVICAL DECOMP/DISCECTOMY FUSION N/A 03/11/2016   Procedure: C5-6, C6-7 Anterior Cervical Discectomy and Fusion, Allograft, Plate;  Surgeon: Marybelle Killings, MD;  Location: [place];  Service: Orthopedics;  Laterality: N/A;  .  CHOLECYSTECTOMY  2014  . COLONOSCOPY WITH PROPOFOL N/A 08/25/2017   Procedure: COLONOSCOPY WITH PROPOFOL;  Surgeon: Manya Silvas, MD;  Location: [facility] ENDOSCOPY;  Service: Endoscopy;  Laterality: N/A;  . CYSTO WITH HYDRODISTENSION N/A 01/26/2014   Procedure: CYSTOSCOPY/HYDRODISTENSION;  Surgeon: Bernestine Amass, MD;  Location[facility];  Service: Urology;  Laterality: N/A;  . CYSTO/  URETHRAL DILATION/  HYDRODISTENTION/   INSTILLATION THERAPY  07-16-2010//   12-30-2007//   10-27-2006  . EXERCISE TOLERENCE TEST  10-12-2010   NEGATIVE  ADEQUATE ETT/  NO ISCHEMIA OR [place] HIGH GRADE OBSTRUCTIVE CAD/  NO FURTHER TEST NEEDED  . [place] ENDOMETRIAL ABLATION  2014  . LAPAROSCOPIC OVARIAN CYST BX  2005   AND  URETEROSCOPIC LASER LITHO  STONE EXTRACTION  . SHOULDER OPEN ROTATOR CUFF REPAIR Right 2004  . TONSILLECTOMY    . TRANSTHORACIC ECHOCARDIOGRAM  06-07-2006   normal study/  ef 60-65%  . TUBAL LIGATION Bilateral 1995   Social History   Occupational History  . Occupation: Merchandiser, retail: UNEMPLOYED    Comment: Disability   Tobacco Use  . Smoking status: Never Smoker  . Smokeless tobacco: Never Used  Substance and Sexual Activity  . Alcohol use: No  . Drug use: No  . Sexual activity: Not on file

## 2018-08-25 NOTE — Addendum Note (Signed)
Addended by: Meyer Cory on: 08/25/2018 03:40 PM   Modules accepted: Orders

## 2018-08-26 ENCOUNTER — Ambulatory Visit (INDEPENDENT_AMBULATORY_CARE_PROVIDER_SITE_OTHER): Payer: Medicare Other

## 2018-08-26 DIAGNOSIS — J301 Allergic rhinitis due to pollen: Secondary | ICD-10-CM | POA: Diagnosis not present

## 2018-08-27 ENCOUNTER — Ambulatory Visit (INDEPENDENT_AMBULATORY_CARE_PROVIDER_SITE_OTHER): Payer: Medicare Other | Admitting: Surgery

## 2018-08-27 DIAGNOSIS — J301 Allergic rhinitis due to pollen: Secondary | ICD-10-CM | POA: Diagnosis not present

## 2018-09-01 ENCOUNTER — Other Ambulatory Visit: Payer: Self-pay | Admitting: Internal Medicine

## 2018-09-02 ENCOUNTER — Ambulatory Visit: Payer: Self-pay | Admitting: Internal Medicine

## 2018-09-02 DIAGNOSIS — J069 Acute upper respiratory infection, unspecified: Secondary | ICD-10-CM | POA: Diagnosis not present

## 2018-09-02 DIAGNOSIS — E7800 Pure hypercholesterolemia, unspecified: Secondary | ICD-10-CM | POA: Diagnosis not present

## 2018-09-02 DIAGNOSIS — R7303 Prediabetes: Secondary | ICD-10-CM | POA: Diagnosis not present

## 2018-09-02 DIAGNOSIS — M797 Fibromyalgia: Secondary | ICD-10-CM | POA: Diagnosis not present

## 2018-09-02 MED FILL — HYDROCODON-APAP 10-325: 10-325 | 30 days supply | Qty: 90 | Fill #0

## 2018-09-08 DIAGNOSIS — H10829 Rosacea conjunctivitis, unspecified eye: Secondary | ICD-10-CM | POA: Diagnosis not present

## 2018-09-08 DIAGNOSIS — D485 Neoplasm of uncertain behavior of skin: Secondary | ICD-10-CM | POA: Diagnosis not present

## 2018-09-08 DIAGNOSIS — L719 Rosacea, unspecified: Secondary | ICD-10-CM | POA: Diagnosis not present

## 2018-09-09 ENCOUNTER — Ambulatory Visit (INDEPENDENT_AMBULATORY_CARE_PROVIDER_SITE_OTHER): Payer: Medicare Other

## 2018-09-09 DIAGNOSIS — J301 Allergic rhinitis due to pollen: Secondary | ICD-10-CM | POA: Diagnosis not present

## 2018-09-11 ENCOUNTER — Ambulatory Visit (INDEPENDENT_AMBULATORY_CARE_PROVIDER_SITE_OTHER): Payer: Medicare Other | Admitting: Physical Medicine and Rehabilitation

## 2018-09-11 ENCOUNTER — Encounter (INDEPENDENT_AMBULATORY_CARE_PROVIDER_SITE_OTHER): Payer: Self-pay | Admitting: Physical Medicine and Rehabilitation

## 2018-09-11 DIAGNOSIS — R202 Paresthesia of skin: Secondary | ICD-10-CM | POA: Diagnosis not present

## 2018-09-11 NOTE — Progress Notes (Signed)
Karina Greer - 52 y.o. female MRN [id number]  Date of birth: 05-18-1966  Office Visit Note: Visit Date: 09/11/2018 PCP: Karina Bender, PA-C Referred by: Karina Bender, PA-C  Subjective: Chief Complaint  Patient presents with  . Right Elbow - Pain, Numbness, Tingling  . Right Forearm - Pain, Numbness, Tingling  . Right Wrist - Pain, Tingling, Numbness  . Right Hand - Pain, Numbness, Tingling  . Neck - Numbness, Pain, Tingling   HPI: Karina Greer is a 52 y.o. female who comes in today For planned electrodiagnostic study of the right upper limb as requested by Dr. Rodell Greer.  Patient is right-hand dominant with prior history of C5-6 and C6-7 ACDF.  She rates her pain and tingling as a 10 out of 10.  This is pain in the neck right elbow and right upper forearm and right wrist and into the middle fourth and fifth digits.  She reports minimal symptoms on the left.  She reports this started 3 months ago.  She feels like the symptoms are fairly constant she is not really sure what makes it worse.  Wearing a brace tends to help her to a degree.  Prior electrodiagnostic study in February 2017 in our office showed bilateral upper extremities were normal nerve conductions and EMG.  ROS Otherwise per HPI.  Assessment & Plan: Visit Diagnoses:  1. Paresthesia of skin     Plan: Impression: Essentially NORMAL electrodiagnostic study of the right upper limb.  There is no significant electrodiagnostic evidence of nerve entrapment, brachial plexopathy or cervical radiculopathy.    As you know, purely sensory or demyelinating radiculopathies and chemical radiculitis may not be detected with this particular electrodiagnostic study.  This electrodiagnostic study cannot rule out small fiber polyneuropathy and dysesthesias from central pain sensitization syndromes such as fibromyalgia.  Myotomal referral pain from trigger points is also not excluded.   Recommendations: 1.  Follow-up with referring  physician. 2.  Continue current management of symptoms.   Meds & Orders: No orders of the defined types were placed in this encounter.   Orders Placed This Encounter  Procedures  . NCV with EMG (electromyography)    Follow-up: Return for Karina Perna, MD.   Procedures: No procedures performed  EMG & NCV Findings: All nerve conduction studies (as indicated in the following tables) were within normal limits.    All examined muscles (as indicated in the following table) showed no evidence of electrical instability.    Impression: Essentially NORMAL electrodiagnostic study of the right upper limb.  There is no significant electrodiagnostic evidence of nerve entrapment, brachial plexopathy or cervical radiculopathy.    As you know, purely sensory or demyelinating radiculopathies and chemical radiculitis may not be detected with this particular electrodiagnostic study.  This electrodiagnostic study cannot rule out small fiber polyneuropathy and dysesthesias from central pain sensitization syndromes such as fibromyalgia.  Myotomal referral pain from trigger points is also not excluded.   Recommendations: 1.  Follow-up with referring physician. 2.  Continue current management of symptoms.  ___________________________ Karina Greer FAAPMR Board Certified, American Board of Physical Medicine and Rehabilitation    Nerve Conduction Studies Anti Sensory Summary Table   Stim Site NR Peak (ms) Norm Peak (ms) P-T Amp (V) Norm P-T Amp Site1 Site2 Delta-P (ms) Dist (cm) Vel (m/s) Norm Vel (m/s)  Right Median Acr Palm Anti Sensory (2nd Digit)  31.9C  Wrist    3.3 <3.6 48.2 >10 Wrist Palm 1.9 0.0    Palm  1.4 <2.0 40.4         Right Radial Anti Sensory (Base 1st Digit)  31.8C  Wrist    1.9 <3.1 36.1  Wrist Base 1st Digit 1.9 0.0    Right Ulnar Anti Sensory (5th Digit)  31.9C  Wrist    2.9 <3.7 20.3 >15.0 Wrist 5th Digit 2.9 14.0 48 >38   Motor Summary Table   Stim Site NR Onset (ms)  Norm Onset (ms) O-P Amp (mV) Norm O-P Amp Site1 Site2 Delta-0 (ms) Dist (cm) Vel (m/s) Norm Vel (m/s)  Right Median Motor (Abd Poll Brev)  31.7C  Wrist    3.3 <4.2 8.1 >5 Elbow Wrist 3.3 18.0 55 >50  Elbow    6.6  8.0         Right Ulnar Motor (Abd Dig Min)  31.7C  Wrist    2.6 <4.2 6.7 >3 B Elbow Wrist 2.6 18.0 69 >53  B Elbow    5.2  8.4  A Elbow B Elbow 1.2 10.0 83 >53  A Elbow    6.4  7.4          EMG   Side Muscle Nerve Root Ins Act Fibs Psw Amp Dur Poly Recrt Int Fraser Din Comment  Right 1stDorInt Ulnar C8-T1 Nml Nml Nml Nml Nml 0 Nml Nml   Right Abd Poll Brev Median C8-T1 Nml Nml Nml Nml Nml 0 Nml Nml   Right ExtDigCom   Nml Nml Nml Nml Nml 0 Nml Nml   Right Triceps Radial C6-7-8 Nml Nml Nml Nml Nml 0 Nml Nml   Right Deltoid Axillary C5-6 Nml Nml Nml Nml Nml 0 Nml Nml     Nerve Conduction Studies Anti Sensory Left/Right Comparison   Stim Site L Lat (ms) R Lat (ms) L-R Lat (ms) L Amp (V) R Amp (V) L-R Amp (%) Site1 Site2 L Vel (m/s) R Vel (m/s) L-R Vel (m/s)  Median Acr Palm Anti Sensory (2nd Digit)  31.9C  Wrist  3.3   48.2  Wrist Palm     Palm  1.4   40.4        Radial Anti Sensory (Base 1st Digit)  31.8C  Wrist  1.9   36.1  Wrist Base 1st Digit     Ulnar Anti Sensory (5th Digit)  31.9C  Wrist  2.9   20.3  Wrist 5th Digit  48    Motor Left/Right Comparison   Stim Site L Lat (ms) R Lat (ms) L-R Lat (ms) L Amp (mV) R Amp (mV) L-R Amp (%) Site1 Site2 L Vel (m/s) R Vel (m/s) L-R Vel (m/s)  Median Motor (Abd Poll Brev)  31.7C  Wrist  3.3   8.1  Elbow Wrist  55   Elbow  6.6   8.0        Ulnar Motor (Abd Dig Min)  31.7C  Wrist  2.6   6.7  B Elbow Wrist  69   B Elbow  5.2   8.4  A Elbow B Elbow  83   A Elbow  6.4   7.4           Waveforms:            Clinical History: 11/15/2015 EMG/NCS bilateral Upper extremity  Impression: Essentially NORMAL electrodiagnostic study of both upper limbs.  There is no significant electrodiagnostic evidence of nerve entrapment,  brachial plexopathy or cervical radiculopathy.    As you know, purely sensory or demyelinating radiculopathies and chemical radiculitis may not be  detected with this particular electrodiagnostic study.  This study would also not rule out a small fiber polyneuropathy or dysesthesias from a central pain sensitization syndrome such as fibromyalgia. ---------------------------------------------------------  CT CERVICAL SPINE WITHOUT CONTRAST  TECHNIQUE: Multidetector CT imaging of the cervical spine was performed without intravenous contrast. Multiplanar CT image reconstructions were also generated.  COMPARISON: Cervical spine MRI 12/03/2015  FINDINGS: Alignment: Normal.  Skull base and vertebrae: Interval C5-6 and C6-7 ACDF. There is evidence of solid interbody arthrodesis. Hardware is intact and located with no signs of loosening. Negative for fracture or bone lesion. No endplate erosion.  Soft tissues and spinal canal: No evidence of inflammation.  Disc levels: Posterior degenerative endplate ridging from E7-2 to C6-7. Improved canal and foraminal patency after C5-6 and C6-7 ACDF. Ridging causes continued mild spinal stenosis at C5-6. No suspected progressive stenosis.  Upper chest: Negative  IMPRESSION: 1. Interval C5-6 and C6-7 ACDF. Cages appear incorporated; no sequela of pseudoarthrosis. 2. No new abnormality when compared to 11/23/2015 MRI.   Electronically Signed By: Monte Fantasia M.D. On: 03/25/2017 13:08   She reports that she has never smoked. She has never used smokeless tobacco. No results for input(s): HGBA1C, LABURIC in the last 8760 hours.  Objective:  VS:  HT:    WT:   BMI:     BP:   HR: bpm  TEMP: ( )  RESP:  Physical Exam Musculoskeletal:        General: Tenderness present. No swelling or deformity.     Comments: Inspection reveals no atrophy of the bilateral APB or FDI or hand intrinsics. There is no swelling, color changes,  allodynia or dystrophic changes. There is 5 out of 5 strength in the bilateral wrist extension, finger abduction and long finger flexion. There is intact sensation to light touch in all dermatomal and peripheral nerve distributions. There is a negative Hoffmann's test bilaterally.  Skin:    General: Skin is warm and dry.     Findings: No erythema or rash.  Neurological:     General: No focal deficit present.     Mental Status: She is alert and oriented to person, place, and time.     Sensory: Sensory deficit present.     Motor: No weakness or abnormal muscle tone.     Coordination: Coordination normal.  Psychiatric:        Mood and Affect: Mood normal.        Behavior: Behavior normal.     Ortho Exam Imaging: No results found.  Past Medical/Family/Surgical/Social History: Medications & Allergies reviewed per EMR, new medications updated. Patient Active Problem List   Diagnosis Date Noted  . Hypothyroidism 07/20/2018  . LLQ pain 12/03/2017  . Pre-diabetes 06/03/2017  . Rectal bleeding 06/02/2017  . S/P cervical spinal fusion 03/11/2016  . Bilateral hand pain 11/29/2015  . Elevated C-reactive protein 11/29/2015  . Elevated rheumatoid factor 11/29/2015  . Raynaud's phenomenon without gangrene 11/29/2015  . Bloating 09/12/2014  . Dizziness 08/08/2014  . Peri-menopause 08/08/2014  . Chronic tension-type headache, intractable 07/13/2014  . Gastroesophageal reflux disease without esophagitis 07/12/2014  . Anxiety 04/18/2014  . Chronic arthritis 04/18/2014  . Cystitis 04/18/2014  . Morbid obesity ([place]) 04/18/2014  . Fibromyalgia 04/18/2014  . 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 Arthropathy 04/18/2014  . Cephalalgia 04/04/2014  . Numbness and tingling 04/04/2014  . 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 Nonspecific abnormal findings on radiological and other examination  of skull and head 02/23/2013  .  Other nonspecific abnormal result of function study of brain and central nervous system 07/01/2012  . Disturbance of skin sensation 07/01/2012  . Migraine without aura 07/01/2012  . Abdominal pain 02/11/2012  . Chronic fatigue syndrome 02/11/2012  . Chronic interstitial cystitis 02/11/2012  . Dyspareunia 02/11/2012  . Dysuria 02/11/2012  . Female stress incontinence 02/11/2012  . Irritable bowel syndrome with diarrhea 02/11/2012  . Nausea and vomiting 02/11/2012  . Nocturia 02/11/2012  . Urge incontinence 02/11/2012  . Urinary urgency 02/11/2012  . Lumbar spondylosis 02/04/2012  . Myalgia and myositis 02/04/2012  . Depression 02/04/2012  . Lumbosacral spondylosis without myelopathy 02/04/2012  . OBESITY, UNSPECIFIED 10/03/2010  . GENERALIZED ANXIETY DISORDER 10/03/2010  . CHEST PAIN, PRECORDIAL 10/03/2010   Past Medical History:  Diagnosis Date  . Anxiety   . Anxiety disorder   . Arthritis   . Blepharitis   . Chronic low back pain   . Depression   . Dyslipidemia   . Fibromyalgia   . GERD (gastroesophageal reflux disease)   . Headache    hx migraines  . History of kidney stones   . History of panic attacks   . History of renal calculi   . IBS (irritable bowel syndrome)   . Interstitial cystitis   . Lupus ([place])    tested positive for antibodies for lupus. dr to do further tests  . Menorrhagia   . OSA on CPAP    not used cpap for several weeks  . Pneumonia    hx  . PONV (postoperative nausea and vomiting)   . RLS (restless legs syndrome)   . Rosacea   . Seasonal asthma   . SI (sacroiliac) joint dysfunction   . SUI (stress urinary incontinence, female)   . UTI (lower urinary tract infection)    hx  . 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 02/23/2013   Family History  Problem Relation Age of Onset  . Hypertension Mother   . Diabetes Father   . Brain cancer Father   . Fibromyalgia Sister   . Suicidality Sister   . Hypertension Other        Cancer,  Cerebrovascular disease run on mother side of family   Past Surgical History:  Procedure Laterality Date  . ACHILLES TENDON SURGERY Right 3/16  . ANTERIOR CERVICAL DECOMP/DISCECTOMY FUSION N/A 03/11/2016   Procedure: C5-6, C6-7 Anterior Cervical Discectomy and Fusion, Allograft, Plate;  Surgeon: Marybelle Killings, MD;  Location: [facility];  Service: Orthopedics;  Laterality: N/A;  . CHOLECYSTECTOMY  2014  . COLONOSCOPY WITH PROPOFOL N/A 08/25/2017   Procedure: COLONOSCOPY WITH PROPOFOL;  Surgeon: Manya Silvas, MD;  Location: [facility] ENDOSCOPY;  Service: Endoscopy;  Laterality: N/A;  . CYSTO WITH HYDRODISTENSION N/A 01/26/2014   Procedure: CYSTOSCOPY/HYDRODISTENSION;  Surgeon: Bernestine Amass, MD;  Location: [facility];  Service: Urology;  Laterality: N/A;  . CYSTO/  URETHRAL DILATION/  HYDRODISTENTION/   INSTILLATION THERAPY  07-16-2010//   12-30-2007//   10-27-2006  . EXERCISE TOLERENCE TEST  10-12-2010   NEGATIVE  ADEQUATE ETT/  NO ISCHEMIA OR EVIDENCE HIGH GRADE OBSTRUCTIVE CAD/  NO FURTHER TEST NEEDED  . [place] ENDOMETRIAL ABLATION  2014  . LAPAROSCOPIC OVARIAN CYST BX  2005   AND  URETEROSCOPIC LASER LITHO  STONE EXTRACTION  . SHOULDER OPEN ROTATOR CUFF REPAIR Right 2004  . TONSILLECTOMY    . TRANSTHORACIC ECHOCARDIOGRAM  06-07-2006   normal study/  ef 60-65%  . TUBAL LIGATION Bilateral 1995   Social History   Occupational History  . Occupation: Merchandiser, retail: UNEMPLOYED    Comment: Disability  Tobacco Use  . Smoking status: Never Smoker  . Smokeless tobacco: Never Used  Substance and Sexual Activity  . Alcohol use: No  . Drug use: No  . Sexual activity: Not on file

## 2018-09-11 NOTE — Progress Notes (Signed)
.  Numeric Pain Rating Scale and Functional Assessment Average Pain 10   In the last MONTH (on 0-10 scale) has pain interfered with the following?  1. General activity like being  able to carry out your everyday physical activities such as walking, climbing stairs, carrying groceries, or moving a chair?  Rating(8)    

## 2018-09-11 NOTE — Procedures (Signed)
EMG & NCV Findings: All nerve conduction studies (as indicated in the following tables) were within normal limits.    All examined muscles (as indicated in the following table) showed no evidence of electrical instability.    Impression: Essentially NORMAL electrodiagnostic study of the right upper limb.  There is no significant electrodiagnostic evidence of nerve entrapment, brachial plexopathy or cervical radiculopathy.    As you know, purely sensory or demyelinating radiculopathies and chemical radiculitis may not be detected with this particular electrodiagnostic study.  This electrodiagnostic study cannot rule out small fiber polyneuropathy and dysesthesias from central pain sensitization syndromes such as fibromyalgia.  Myotomal referral pain from trigger points is also not excluded.   Recommendations: 1.  Follow-up with referring physician. 2.  Continue current management of symptoms.  ___________________________ Laurence Spates FAAPMR Board Certified, American Board of Physical Medicine and Rehabilitation    Nerve Conduction Studies Anti Sensory Summary Table   Stim Site NR Peak (ms) Norm Peak (ms) P-T Amp (V) Norm P-T Amp Site1 Site2 Delta-P (ms) Dist (cm) Vel (m/s) Norm Vel (m/s)  Right Median Acr Palm Anti Sensory (2nd Digit)  31.9C  Wrist    3.3 <3.6 48.2 >10 Wrist Palm 1.9 0.0    Palm    1.4 <2.0 40.4         Right Radial Anti Sensory (Base 1st Digit)  31.8C  Wrist    1.9 <3.1 36.1  Wrist Base 1st Digit 1.9 0.0    Right Ulnar Anti Sensory (5th Digit)  31.9C  Wrist    2.9 <3.7 20.3 >15.0 Wrist 5th Digit 2.9 14.0 48 >38   Motor Summary Table   Stim Site NR Onset (ms) Norm Onset (ms) O-P Amp (mV) Norm O-P Amp Site1 Site2 Delta-0 (ms) Dist (cm) Vel (m/s) Norm Vel (m/s)  Right Median Motor (Abd Poll Brev)  31.7C  Wrist    3.3 <4.2 8.1 >5 Elbow Wrist 3.3 18.0 55 >50  Elbow    6.6  8.0         Right Ulnar Motor (Abd Dig Min)  31.7C  Wrist    2.6 <4.2 6.7 >3 B Elbow  Wrist 2.6 18.0 69 >53  B Elbow    5.2  8.4  A Elbow B Elbow 1.2 10.0 83 >53  A Elbow    6.4  7.4          EMG   Side Muscle Nerve Root Ins Act Fibs Psw Amp Dur Poly Recrt Int Fraser Din Comment  Right 1stDorInt Ulnar C8-T1 Nml Nml Nml Nml Nml 0 Nml Nml   Right Abd Poll Brev Median C8-T1 Nml Nml Nml Nml Nml 0 Nml Nml   Right ExtDigCom   Nml Nml Nml Nml Nml 0 Nml Nml   Right Triceps Radial C6-7-8 Nml Nml Nml Nml Nml 0 Nml Nml   Right Deltoid Axillary C5-6 Nml Nml Nml Nml Nml 0 Nml Nml     Nerve Conduction Studies Anti Sensory Left/Right Comparison   Stim Site L Lat (ms) R Lat (ms) L-R Lat (ms) L Amp (V) R Amp (V) L-R Amp (%) Site1 Site2 L Vel (m/s) R Vel (m/s) L-R Vel (m/s)  Median Acr Palm Anti Sensory (2nd Digit)  31.9C  Wrist  3.3   48.2  Wrist Palm     Palm  1.4   40.4        Radial Anti Sensory (Base 1st Digit)  31.8C  Wrist  1.9   36.1  Wrist Base 1st  Digit     Ulnar Anti Sensory (5th Digit)  31.9C  Wrist  2.9   20.3  Wrist 5th Digit  48    Motor Left/Right Comparison   Stim Site L Lat (ms) R Lat (ms) L-R Lat (ms) L Amp (mV) R Amp (mV) L-R Amp (%) Site1 Site2 L Vel (m/s) R Vel (m/s) L-R Vel (m/s)  Median Motor (Abd Poll Brev)  31.7C  Wrist  3.3   8.1  Elbow Wrist  55   Elbow  6.6   8.0        Ulnar Motor (Abd Dig Min)  31.7C  Wrist  2.6   6.7  B Elbow Wrist  69   B Elbow  5.2   8.4  A Elbow B Elbow  83   A Elbow  6.4   7.4           Waveforms:

## 2018-09-14 DIAGNOSIS — F411 Generalized anxiety disorder: Secondary | ICD-10-CM | POA: Diagnosis not present

## 2018-09-14 DIAGNOSIS — F332 Major depressive disorder, recurrent severe without psychotic features: Secondary | ICD-10-CM | POA: Diagnosis not present

## 2018-09-21 ENCOUNTER — Encounter (INDEPENDENT_AMBULATORY_CARE_PROVIDER_SITE_OTHER): Payer: Self-pay | Admitting: Orthopaedic Surgery

## 2018-09-21 ENCOUNTER — Encounter: Payer: Self-pay | Admitting: Registered Nurse

## 2018-09-21 ENCOUNTER — Ambulatory Visit (INDEPENDENT_AMBULATORY_CARE_PROVIDER_SITE_OTHER): Payer: Medicare Other | Admitting: Orthopaedic Surgery

## 2018-09-21 ENCOUNTER — Other Ambulatory Visit: Payer: Self-pay | Admitting: Internal Medicine

## 2018-09-21 ENCOUNTER — Encounter: Payer: Medicare Other | Attending: Physical Medicine & Rehabilitation | Admitting: Registered Nurse

## 2018-09-21 VITALS — BP 134/82 | HR 77 | Ht 63.0 in | Wt 232.0 lb

## 2018-09-21 VITALS — BP 134/85 | Ht 63.0 in | Wt 232.0 lb

## 2018-09-21 DIAGNOSIS — M62838 Other muscle spasm: Secondary | ICD-10-CM | POA: Diagnosis not present

## 2018-09-21 DIAGNOSIS — M797 Fibromyalgia: Secondary | ICD-10-CM | POA: Diagnosis not present

## 2018-09-21 DIAGNOSIS — R202 Paresthesia of skin: Secondary | ICD-10-CM | POA: Diagnosis not present

## 2018-09-21 DIAGNOSIS — F3289 Other specified depressive episodes: Secondary | ICD-10-CM | POA: Diagnosis not present

## 2018-09-21 DIAGNOSIS — M79642 Pain in left hand: Secondary | ICD-10-CM

## 2018-09-21 DIAGNOSIS — Z79899 Other long term (current) drug therapy: Secondary | ICD-10-CM | POA: Diagnosis not present

## 2018-09-21 DIAGNOSIS — Z5181 Encounter for therapeutic drug level monitoring: Secondary | ICD-10-CM | POA: Diagnosis not present

## 2018-09-21 DIAGNOSIS — Z981 Arthrodesis status: Secondary | ICD-10-CM

## 2018-09-21 DIAGNOSIS — M7061 Trochanteric bursitis, right hip: Secondary | ICD-10-CM

## 2018-09-21 DIAGNOSIS — M79641 Pain in right hand: Secondary | ICD-10-CM | POA: Diagnosis not present

## 2018-09-21 DIAGNOSIS — R200 Anesthesia of skin: Secondary | ICD-10-CM | POA: Diagnosis not present

## 2018-09-21 DIAGNOSIS — F419 Anxiety disorder, unspecified: Secondary | ICD-10-CM | POA: Diagnosis not present

## 2018-09-21 DIAGNOSIS — M542 Cervicalgia: Secondary | ICD-10-CM | POA: Diagnosis not present

## 2018-09-21 DIAGNOSIS — M47816 Spondylosis without myelopathy or radiculopathy, lumbar region: Secondary | ICD-10-CM

## 2018-09-21 DIAGNOSIS — G894 Chronic pain syndrome: Secondary | ICD-10-CM

## 2018-09-21 MED ORDER — HYDROCODONE-ACETAMINOPHEN 10-325 MG PO TABS: ORAL_TABLET | ORAL | 0 refills | Status: DC

## 2018-09-21 NOTE — Progress Notes (Signed)
Subjective:    Patient ID: Karina Greer, female    DOB: October 02, 1965, 52 y.o.   MRN: [id number]  HPI: Karina Greer is a 52 y.o. female who returns for follow up appointment for chronic pain and medication refill. She states her pain is located in her neck, right hand pink, middle finger and index finger radiating into her right arm, lower back pain and right hip pain Also states she has kidney pain, she also reports she has a history of UTI's, and she will be going to Urgent care today. She rates her pain 9. Her current exercise regime is walking and walking treadmill 15 minutes daily.   Karina Greer Morphine equivalent is 30.00MME. She is also prescribed Alprazolam by Lorelle Gibbs NP. We have discussed the black box warning of using opioids and benzodiazepines. I highlighted the dangers of using these drugs together and discussed the adverse events including respiratory suppression, overdose, cognitive impairment and importance of compliance with current regimen. We will continue to monitor and adjust as indicated.   She  is being closely monitored and under the care of her psychiatrist at the [facility]. Last Oral Swab was Performed on 05/15/2018, it was consistent.   Pain Inventory Average Pain 8 Pain Right Now 9 My pain is constant, sharp, stabbing and aching  In the last 24 hours, has pain interfered with the following? General activity 9 Relation with others 9 Enjoyment of life 9 What TIME of day is your pain at its worst? all Sleep (in general) Fair  Pain is worse with: walking, bending, sitting, inactivity, standing and some activites Pain improves with: rest, heat/ice, therapy/exercise, pacing activities, medication and TENS Relief from Meds: na  Mobility walk without assistance ability to climb steps?  yes do you drive?  yes  Function disabled: date disabled na I need assistance with the following:  meal prep, household duties and  shopping  Neuro/Psych anxiety  Prior Studies Any changes since last visit?  no  Physicians involved in your care Any changes since last visit?  no   Family History  Problem Relation Age of Onset  . Hypertension Mother   . Diabetes Father   . Brain cancer Father   . Fibromyalgia Sister   . Suicidality Sister   . Hypertension Other        Cancer, Cerebrovascular disease run on mother side of family   Social History   Socioeconomic History  . Marital status: Divorced    Spouse name: Not on file  . Number of children: 3  . Years of education: HS  . Highest education level: 12th grade  Occupational History  . Occupation: Merchandiser, retail: UNEMPLOYED    Comment: Disability  Social Needs  . Financial resource strain: Not on file  . Food insecurity:    Worry: Not on file    Inability: Not on file  . Transportation needs:    Medical: Not on file    Non-medical: Not on file  Tobacco Use  . Smoking status: Never Smoker  . Smokeless tobacco: Never Used  Substance and Sexual Activity  . Alcohol use: No  . Drug use: No  . Sexual activity: Not on file  Lifestyle  . Physical activity:    Days per week: Not on file    Minutes per session: Not on file  . Stress: Not on file  Relationships  . Social connections:    Talks on phone: Not on file  Gets together: Not on file    Attends religious service: Not on file    Active member of club or organization: Not on file    Attends meetings of clubs or organizations: Not on file    Relationship status: Not on file  Other Topics Concern  . Not on file  Social History Narrative   Patient is right-handed. She avoids caffeine. She has recently been using the treadmill.   Past Surgical History:  Procedure Laterality Date  . ACHILLES TENDON SURGERY Right 3/16  . ANTERIOR CERVICAL DECOMP/DISCECTOMY FUSION N/A 03/11/2016   Procedure: C5-6, C6-7 Anterior Cervical Discectomy and Fusion, Allograft, Plate;  Surgeon: Marybelle Killings, MD;  Location: [facility];  Service: Orthopedics;  Laterality: N/A;  . CHOLECYSTECTOMY  2014  . COLONOSCOPY WITH PROPOFOL N/A 08/25/2017   Procedure: COLONOSCOPY WITH PROPOFOL;  Surgeon: Manya Silvas, MD;  Location: [facility] ENDOSCOPY;  Service: Endoscopy;  Laterality: N/A;  . CYSTO WITH HYDRODISTENSION N/A 01/26/2014   Procedure: CYSTOSCOPY/HYDRODISTENSION;  Surgeon: Bernestine Amass, MD;  Location: [facility];  Service: Urology;  Laterality: N/A;  . CYSTO/  URETHRAL DILATION/  HYDRODISTENTION/   INSTILLATION THERAPY  07-16-2010//   12-30-2007//   10-27-2006  . EXERCISE TOLERENCE TEST  10-12-2010   NEGATIVE  ADEQUATE ETT/  NO ISCHEMIA OR EVIDENCE HIGH GRADE OBSTRUCTIVE CAD/  NO FURTHER TEST NEEDED  . [place] ENDOMETRIAL ABLATION  2014  . LAPAROSCOPIC OVARIAN CYST BX  2005   AND  URETEROSCOPIC LASER LITHO  STONE EXTRACTION  . SHOULDER OPEN ROTATOR CUFF REPAIR Right 2004  . TONSILLECTOMY    . TRANSTHORACIC ECHOCARDIOGRAM  06-07-2006   normal study/  ef 60-65%  . TUBAL LIGATION Bilateral 1995   Past Medical History:  Diagnosis Date  . Anxiety   . Anxiety disorder   . Arthritis   . Blepharitis   . Chronic low back pain   . Depression   . Dyslipidemia   . Fibromyalgia   . GERD (gastroesophageal reflux disease)   . Headache    hx migraines  . History of kidney stones   . History of panic attacks   . History of renal calculi   . IBS (irritable bowel syndrome)   . Interstitial cystitis   . Lupus ([place])    tested positive for antibodies for lupus. dr to do further tests  . Menorrhagia   . OSA on CPAP    not used cpap for several weeks  . Pneumonia    hx  . PONV (postoperative nausea and vomiting)   . RLS (restless legs syndrome)   . Rosacea   . Seasonal asthma   . SI (sacroiliac) joint dysfunction   . SUI (stress urinary incontinence, female)   . UTI (lower urinary tract infection)    hx  . 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 02/23/2013    There were no vitals taken for this visit.  Opioid Risk Score:   Fall Risk Score:  `1  Depression screen PHQ 2/9  Depression screen [facility] 2/9 08/12/2018 07/20/2018 06/19/2018 04/20/2018 01/29/2018 01/28/2018 12/15/2017  Decreased Interest 0 1 1 0 0 1 2  Down, Depressed, Hopeless 0 1 1 0 0 1 2  PHQ - 2 Score 0 2 2 0 0 2 4  Altered sleeping - - - - - - -  Tired, decreased energy - - - - - - -  Change in appetite - - - - - - -  Feeling bad  or failure about yourself  - - - - - - -  Trouble concentrating - - - - - - -  Moving slowly or fidgety/restless - - - - - - -  Suicidal thoughts - - - - - - -  PHQ-9 Score - - - - - - -  Difficult doing work/chores - - - - - - -  Some recent data might be hidden     Review of Systems  Constitutional: Negative.   HENT: Negative.   Eyes: Negative.   Respiratory: Negative.   Cardiovascular: Negative.   Gastrointestinal: Negative.   Endocrine: Negative.   Genitourinary: Negative.   Musculoskeletal: Positive for arthralgias, back pain and myalgias.  Skin: Negative.   Allergic/Immunologic: Negative.   Neurological: Negative.   Hematological: Negative.   Psychiatric/Behavioral: The patient is nervous/anxious.   All other systems reviewed and are negative.      Objective:   Physical Exam Vitals signs and nursing note reviewed.  Constitutional:      Appearance: Normal appearance. She is obese.  Neck:     Musculoskeletal: Normal range of motion and neck supple.     Comments: Cervical Paraspinal Tenderness: C-5-C-6 Cardiovascular:     Rate and Rhythm: Normal rate and regular rhythm.     Pulses: Normal pulses.     Heart sounds: Normal heart sounds.  Pulmonary:     Effort: Pulmonary effort is normal.     Breath sounds: Normal breath sounds.  Musculoskeletal:     Comments: Normal Muscle Bulk and Muscle Testing Reveals:  Upper Extremities: Full ROM and Muscle Strength 5/5 Thoracic,  Lumbar Paraspinal Tenderness: L-3-L-5  Costovertebral  tenderness Lower Extremities: Full ROM and Muscle Strength 5/5 Arises from Table with ease Narrow Based Gait   Skin:    General: Skin is warm and dry.  Neurological:     Mental Status: She is alert and oriented to person, place, and time.  Psychiatric:        Mood and Affect: Mood normal.        Behavior: Behavior normal.           Assessment & Plan:  1. Lumbago/ Lumbar Spondylosis/ Right Lumbar Radiculitis: Continue to Monitor.09/21/2018 Refilled: HYDROcodone 10/325mg  one tablet every8hours as needed #90.  We will continue the opioid monitoring program, this consists of regular clinic visits, examinations, urine drug screen, pill counts as well as use of [place] Controlled Substance Reporting System. 2. Fibromyalgia. Continue Current exercise Regime.09/21/2018 3. Anxiety and depression:Psychiatry Following: Seeing.Jessicaat the [place] [facility]. Continue Counseling at [facility]. On Xanax,09/21/2018 4. Migraines: On Maxalt. Neurology Following.09/21/2018 5. OSA : Continue exercise regime as tolerated and losing weight.09/21/2018 6. Obesity: Following Healthy Diet Regimen.09/21/2018 7. Status Post Cervical Spinal Fusion: C5C6- C-6- C-7 Anterior Cervical Discectomy Fusion and Allograft Plate:Dr. Lorin Mercy Following.09/21/2018 8. Cervicalgia/ Cervical Radiculitis/ S/P Cervical Spinal Fusion: Continueto MonitorDr. Yates Following. 09/21/2018 9. Muscle Spasm: Continue Tizanidine as needed.09/21/2018 10.Hereditary and Idiopathic Peripheral Neuropathy Continue with Tens Unit. 09/21/2018 11.Right Greater Trochanteric Bursitis: Continue to Alternate Ice and eat Therapy. Continue to Monitor. 09/21/2018.  20 minutes of face to face patient care time was spent during this visit. All questions were encouraged and answered.   F/U in 1month

## 2018-09-21 NOTE — Progress Notes (Signed)
Office Visit Note   Patient: Karina Greer           Date of Birth: 1965/11/26           MRN: [id number] Visit Date: 09/21/2018              Requested by: Cyndi Bender, PA-C [address] [place], [place] [address] PCP: Cyndi Bender, PA-C   Assessment & Plan: Visit Diagnoses:  1. Bilateral hand pain   2. Fibromyalgia   3.      Cervical fusion 2017 Plan: Patient will work on weight loss gradually lose 40 pounds with diet and walking program.  We reviewed the electrical test I gave her a copy of the report no evidence of nerve compression in the upper extremities.  Work in a Mus[employer] which should help her hyperglycemia, fibromyalgia, and morbid obesity.  Office follow-up PRN.  Follow-Up Instructions: No follow-ups on file.   Orders:  No orders of the defined types were placed in this encounter.  No orders of the defined types were placed in this encounter.     Procedures: No procedures performed   Clinical Data: No additional findings.   Subjective: Chief Complaint  Patient presents with  . Right Arm - Pain, Follow-up    NCS review    HPI 52 year old female returns with ongoing problems with right elbow pain and sometimes pain in her hand recently she has had more forearm discomfort less problems with her hand.  EMGs nerve conduction velocities were performed and ruled out cubital tunnel or cervical radiculopathy.  She does have a diagnosis of fibromyalgia.  She has had some elevated glucose readings she is lost 4 pounds and is working on a [employer].  C5-C7 2 level fusion cervical 03/11/2016 solidly healed.  Review of Systems updated unchanged from 07/09/1999 1914 point systems.   Objective: Vital Signs: BP 134/85   Ht 5\' 3"  (1.6 m)   Wt 232 lb (105.2 kg)   BMI 41.10 kg/m   Physical Exam Constitutional:      Appearance: She is well-developed.  HENT:     Head: Normocephalic.     Right Ear: External ear normal.     Left Ear: External ear  normal.  Eyes:     Pupils: Pupils are equal, round, and reactive to light.  Neck:     Thyroid: No thyromegaly.     Trachea: No tracheal deviation.  Cardiovascular:     Rate and Rhythm: Normal rate.  Pulmonary:     Effort: Pulmonary effort is normal.  Abdominal:     Palpations: Abdomen is soft.  Skin:    General: Skin is warm and dry.  Neurological:     Mental Status: She is alert and oriented to person, place, and time.  Psychiatric:        Behavior: Behavior normal.     Ortho Exam negative ulnar subluxation right elbow.  Full extension of the elbow flexion.  Interossei are strong.  No brachial plexus tenderness.  Per fundi ring and small finger are normal.  Good finger extension.  Negative shoulder subluxation no bruit supraclavicular lymphadenopathy.  No rash over exposed skin.  No lower extremity clonus no lower extremity hyperreflexia.  Specialty Comments:  No specialty comments available.  Imaging: No results found.   PMFS History: Patient Active Problem List   Diagnosis Date Noted  . Hypothyroidism 07/20/2018  . LLQ pain 12/03/2017  . Pre-diabetes 06/03/2017  . Rectal bleeding 06/02/2017  . S/P  cervical spinal fusion 03/11/2016  . Bilateral hand pain 11/29/2015  . Elevated C-reactive protein 11/29/2015  . Elevated rheumatoid factor 11/29/2015  . Raynaud's phenomenon without gangrene 11/29/2015  . Bloating 09/12/2014  . Dizziness 08/08/2014  . Peri-menopause 08/08/2014  . Chronic tension-type headache, intractable 07/13/2014  . Gastroesophageal reflux disease without esophagitis 07/12/2014  . Anxiety 04/18/2014  . Chronic arthritis 04/18/2014  . Cystitis 04/18/2014  . Morbid obesity ([place]) 04/18/2014  . Fibromyalgia 04/18/2014  . 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 Arthropathy 04/18/2014  . Cephalalgia 04/04/2014  . Numbness and tingling 04/04/2014  . 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  02/23/2013  . Nonspecific abnormal findings on radiological and other examination of skull and head 02/23/2013  . Other nonspecific abnormal result of function study of brain and central nervous system 07/01/2012  . Disturbance of skin sensation 07/01/2012  . Migraine without aura 07/01/2012  . Abdominal pain 02/11/2012  . Chronic fatigue syndrome 02/11/2012  . Chronic interstitial cystitis 02/11/2012  . Dyspareunia 02/11/2012  . Dysuria 02/11/2012  . Female stress incontinence 02/11/2012  . Irritable bowel syndrome with diarrhea 02/11/2012  . Nausea and vomiting 02/11/2012  . Nocturia 02/11/2012  . Urge incontinence 02/11/2012  . Urinary urgency 02/11/2012  . Lumbar spondylosis 02/04/2012  . Myalgia and myositis 02/04/2012  . Depression 02/04/2012  . Lumbosacral spondylosis without myelopathy 02/04/2012  . OBESITY, UNSPECIFIED 10/03/2010  . GENERALIZED ANXIETY DISORDER 10/03/2010  . CHEST PAIN, PRECORDIAL 10/03/2010   Past Medical History:  Diagnosis Date  . Anxiety   . Anxiety disorder   . Arthritis   . Blepharitis   . Chronic low back pain   . Depression   . Dyslipidemia   . Fibromyalgia   . GERD (gastroesophageal reflux disease)   . Headache    hx migraines  . History of kidney stones   . History of panic attacks   . History of renal calculi   . IBS (irritable bowel syndrome)   . Interstitial cystitis   . Lupus ([place])    tested positive for antibodies for lupus. dr to do further tests  . Menorrhagia   . OSA on CPAP    not used cpap for several weeks  . Pneumonia    hx  . PONV (postoperative nausea and vomiting)   . RLS (restless legs syndrome)   . Rosacea   . Seasonal asthma   . SI (sacroiliac) joint dysfunction   . SUI (stress urinary incontinence, female)   . UTI (lower urinary tract infection)    hx  . 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 Hypertension Mother   . Diabetes Father   .  Brain cancer Father   . Fibromyalgia Sister   . Suicidality Sister   . Hypertension Other        Cancer, Cerebrovascular disease run on mother side of family    Past Surgical History:  Procedure Laterality Date  . ACHILLES TENDON SURGERY Right 3/16  . ANTERIOR CERVICAL DECOMP/DISCECTOMY FUSION N/A 03/11/2016   Procedure: C5-6, C6-7 Anterior Cervical Discectomy and Fusion, Allograft, Plate;  Surgeon: Marybelle Killings, MD;  Location: [facility];  Service: Orthopedics;  Laterality: N/A;  . CHOLECYSTECTOMY  2014  . COLONOSCOPY WITH PROPOFOL N/A 08/25/2017   Procedure: COLONOSCOPY WITH PROPOFOL;  Surgeon: Manya Silvas, MD;  Location: [facility] ENDOSCOPY;  Service: Endoscopy;  Laterality: N/A;  .  CYSTO WITH HYDRODISTENSION N/A 01/26/2014   Procedure: CYSTOSCOPY/HYDRODISTENSION;  Surgeon: Bernestine Amass, MD;  Location[facility];  Service: Urology;  Laterality: N/A;  . CYSTO/  URETHRAL DILATION/  HYDRODISTENTION/   INSTILLATION THERAPY  07-16-2010//   12-30-2007//   10-27-2006  . EXERCISE TOLERENCE TEST  10-12-2010   NEGATIVE  ADEQUATE ETT/  NO ISCHEMIA OR EVIDENCE HIGH GRADE OBSTRUCTIVE CAD/  NO FURTHER TEST NEEDED  . [place] ENDOMETRIAL ABLATION  2014  . LAPAROSCOPIC OVARIAN CYST BX  2005   AND  URETEROSCOPIC LASER LITHO  STONE EXTRACTION  . SHOULDER OPEN ROTATOR CUFF REPAIR Right 2004  . TONSILLECTOMY    . TRANSTHORACIC ECHOCARDIOGRAM  06-07-2006   normal study/  ef 60-65%  . TUBAL LIGATION Bilateral 1995   Social History   Occupational History  . Occupation: Merchandiser, retail: UNEMPLOYED    Comment: Disability  Tobacco Use  . Smoking status: Never Smoker  . Smokeless tobacco: Never Used  Substance and Sexual Activity  . Alcohol use: No  . Drug use: No  . Sexual activity: Not on file

## 2018-09-22 ENCOUNTER — Ambulatory Visit (INDEPENDENT_AMBULATORY_CARE_PROVIDER_SITE_OTHER): Payer: Self-pay | Admitting: Orthopaedic Surgery

## 2018-09-24 ENCOUNTER — Ambulatory Visit (INDEPENDENT_AMBULATORY_CARE_PROVIDER_SITE_OTHER): Payer: Medicare Other

## 2018-09-24 DIAGNOSIS — R109 Unspecified abdominal pain: Secondary | ICD-10-CM | POA: Diagnosis not present

## 2018-09-24 DIAGNOSIS — K2930 Chronic superficial gastritis without bleeding: Secondary | ICD-10-CM | POA: Diagnosis not present

## 2018-09-24 DIAGNOSIS — J301 Allergic rhinitis due to pollen: Secondary | ICD-10-CM

## 2018-09-24 DIAGNOSIS — K219 Gastro-esophageal reflux disease without esophagitis: Secondary | ICD-10-CM | POA: Diagnosis not present

## 2018-09-24 DIAGNOSIS — Z6841 Body Mass Index (BMI) 40.0 and over, adult: Secondary | ICD-10-CM | POA: Diagnosis not present

## 2018-09-24 DIAGNOSIS — K580 Irritable bowel syndrome with diarrhea: Secondary | ICD-10-CM | POA: Diagnosis not present

## 2018-09-30 ENCOUNTER — Ambulatory Visit (INDEPENDENT_AMBULATORY_CARE_PROVIDER_SITE_OTHER): Payer: Medicare Other | Admitting: Internal Medicine

## 2018-09-30 DIAGNOSIS — J45909 Unspecified asthma, uncomplicated: Secondary | ICD-10-CM | POA: Diagnosis not present

## 2018-09-30 LAB — PULMONARY FUNCTION TEST

## 2018-10-01 MED FILL — HYDROCODON-APAP 10-325: 10-325 | 30 days supply | Qty: 90 | Fill #0

## 2018-10-01 NOTE — Procedures (Signed)
[place] [place], [id number]  DATE OF SERVICE: September 30, 2018  Complete Pulmonary Function Testing Interpretation:  FINDINGS:  The forced vital capacity is normal.  The FEV1 is normal.  Measurement of the FEV1 was 2.84 L which was 114% of predicted.  FEV1 FVC ratio is normal.  Postbronchodilator there is no significant change in the FEV1 however clinical improvement may occur in the absence of spirometric improvement and does not preclude the use of bronchodilators.  Total lung capacity is normal FRC is decreased residual volume is normal residual volume total lung capacity ratio is decreased.  The DLCO is within normal limits.  IMPRESSION:  This pulmonary function study is within normal limits.  Clinical correlation is recommended  Allyne Gee, MD [facility] Pulmonary Critical Care Medicine Sleep Medicine

## 2018-10-07 ENCOUNTER — Ambulatory Visit (INDEPENDENT_AMBULATORY_CARE_PROVIDER_SITE_OTHER): Payer: Medicare Other

## 2018-10-07 DIAGNOSIS — J301 Allergic rhinitis due to pollen: Secondary | ICD-10-CM

## 2018-10-07 DIAGNOSIS — G43009 Migraine without aura, not intractable, without status migrainosus: Secondary | ICD-10-CM | POA: Diagnosis not present

## 2018-10-07 DIAGNOSIS — K219 Gastro-esophageal reflux disease without esophagitis: Secondary | ICD-10-CM | POA: Diagnosis not present

## 2018-10-07 DIAGNOSIS — Z79899 Other long term (current) drug therapy: Secondary | ICD-10-CM | POA: Diagnosis not present

## 2018-10-07 DIAGNOSIS — E041 Nontoxic single thyroid nodule: Secondary | ICD-10-CM | POA: Diagnosis not present

## 2018-10-07 DIAGNOSIS — E7800 Pure hypercholesterolemia, unspecified: Secondary | ICD-10-CM | POA: Diagnosis not present

## 2018-10-07 DIAGNOSIS — E559 Vitamin D deficiency, unspecified: Secondary | ICD-10-CM | POA: Diagnosis not present

## 2018-10-12 DIAGNOSIS — F411 Generalized anxiety disorder: Secondary | ICD-10-CM | POA: Diagnosis not present

## 2018-10-12 DIAGNOSIS — F332 Major depressive disorder, recurrent severe without psychotic features: Secondary | ICD-10-CM | POA: Diagnosis not present

## 2018-10-20 ENCOUNTER — Encounter: Payer: Medicare Other | Attending: Physical Medicine & Rehabilitation | Admitting: Registered Nurse

## 2018-10-20 ENCOUNTER — Encounter: Payer: Self-pay | Admitting: Registered Nurse

## 2018-10-20 VITALS — BP 132/93 | HR 65 | Ht 63.0 in | Wt 231.0 lb

## 2018-10-20 DIAGNOSIS — Z981 Arthrodesis status: Secondary | ICD-10-CM | POA: Diagnosis not present

## 2018-10-20 DIAGNOSIS — G8929 Other chronic pain: Secondary | ICD-10-CM

## 2018-10-20 DIAGNOSIS — R202 Paresthesia of skin: Secondary | ICD-10-CM | POA: Insufficient documentation

## 2018-10-20 DIAGNOSIS — M62838 Other muscle spasm: Secondary | ICD-10-CM

## 2018-10-20 DIAGNOSIS — M7062 Trochanteric bursitis, left hip: Secondary | ICD-10-CM

## 2018-10-20 DIAGNOSIS — M542 Cervicalgia: Secondary | ICD-10-CM

## 2018-10-20 DIAGNOSIS — G894 Chronic pain syndrome: Secondary | ICD-10-CM

## 2018-10-20 DIAGNOSIS — Z79899 Other long term (current) drug therapy: Secondary | ICD-10-CM

## 2018-10-20 DIAGNOSIS — F419 Anxiety disorder, unspecified: Secondary | ICD-10-CM | POA: Diagnosis not present

## 2018-10-20 DIAGNOSIS — M797 Fibromyalgia: Secondary | ICD-10-CM | POA: Insufficient documentation

## 2018-10-20 DIAGNOSIS — M7061 Trochanteric bursitis, right hip: Secondary | ICD-10-CM | POA: Diagnosis not present

## 2018-10-20 DIAGNOSIS — Z5181 Encounter for therapeutic drug level monitoring: Secondary | ICD-10-CM

## 2018-10-20 DIAGNOSIS — R200 Anesthesia of skin: Secondary | ICD-10-CM | POA: Diagnosis not present

## 2018-10-20 DIAGNOSIS — F3181 Bipolar II disorder: Secondary | ICD-10-CM | POA: Diagnosis not present

## 2018-10-20 DIAGNOSIS — M47816 Spondylosis without myelopathy or radiculopathy, lumbar region: Secondary | ICD-10-CM | POA: Diagnosis not present

## 2018-10-20 DIAGNOSIS — F3289 Other specified depressive episodes: Secondary | ICD-10-CM | POA: Diagnosis not present

## 2018-10-20 DIAGNOSIS — F332 Major depressive disorder, recurrent severe without psychotic features: Secondary | ICD-10-CM | POA: Diagnosis not present

## 2018-10-20 DIAGNOSIS — F411 Generalized anxiety disorder: Secondary | ICD-10-CM | POA: Diagnosis not present

## 2018-10-20 DIAGNOSIS — M546 Pain in thoracic spine: Secondary | ICD-10-CM

## 2018-10-20 MED ORDER — HYDROCODONE-ACETAMINOPHEN 10-325 MG PO TABS: ORAL_TABLET | ORAL | 0 refills | Status: DC

## 2018-10-20 MED FILL — ALPRAZolam 0.25 MG TABS: 0.25 | 30 days supply | Qty: 60 | Fill #0

## 2018-10-20 MED FILL — VYVANSE 20 MG CAPSULE: 20 | 30 days supply | Qty: 30 | Fill #0

## 2018-10-20 MED FILL — ROSUVASTATIN CALCIUM 10 MG: 10 | 30 days supply | Qty: 30 | Fill #0

## 2018-10-20 NOTE — Progress Notes (Signed)
Subjective:    Patient ID: Karina Greer, female    DOB: 08/01/1966, 53 y.o.   MRN: [id number]  HPI: Karina Greer is a 53 y.o. female who returns for follow up appointment for chronic pain and medication refill. Karina Greer states her pain is located in her neck, bilateral shoulders. Upper back- lower back, bilateral hips L>R and bilateral lower extremities. Karina Greer rates her pain 6. Her. current exercise regime is walking, walking on treadmill 3-5 days a week for 15 minutes and performing stretching exercises.  Karina Greer is 30.00 MME. Karina Greer  is also prescribed Alprazolam by Lorelle Gibbs NP. We have discussed the black box warning of using opioids and benzodiazepines. I highlighted the dangers of using these drugs together and discussed the adverse events including respiratory suppression, overdose, cognitive impairment and importance of compliance with current regimen. We will continue to monitor and adjust as indicated.   Karina Greer is being closely monitored and under the care of her psychiatrist at the [facility].   Last Oral Swab was Performed on 05/15/2018, it was consistent.   Pain Inventory Average Pain 8 Pain Right Now 6 My pain is constant, sharp, stabbing and tingling  In the last 24 hours, has pain interfered with the following? General activity 7 Relation with others 8 Enjoyment of life 9 What TIME of day is your pain at its worst? daytime, evening Sleep (in general) Fair  Pain is worse with: walking, bending, sitting, inactivity, standing and some activites Pain improves with: rest, heat/ice, therapy/exercise, pacing activities, medication and TENS Relief from Meds: 5  Mobility walk without assistance how many minutes can you walk? 5 ability to climb steps?  yes do you drive?  yes Do you have any goals in this area?  yes  Function I need assistance with the following:  meal prep, household duties and shopping Do you have any goals in this area?   yes  Neuro/Psych anxiety  Prior Studies Any changes since last visit?  no  Physicians involved in your care Any changes since last visit?  no   Family History  Problem Relation Age of Onset  . Hypertension Mother   . Diabetes Father   . Brain cancer Father   . Fibromyalgia Sister   . Suicidality Sister   . Hypertension Other        Cancer, Cerebrovascular disease run on mother side of family   Social History   Socioeconomic History  . Marital status: Divorced    Spouse name: Not on file  . Number of children: 3  . Years of education: HS  . Highest education level: 12th grade  Occupational History  . Occupation: Merchandiser, retail: UNEMPLOYED    Comment: Disability  Social Needs  . Financial resource strain: Not on file  . Food insecurity:    Worry: Not on file    Inability: Not on file  . Transportation needs:    Medical: Not on file    Non-medical: Not on file  Tobacco Use  . Smoking status: Never Smoker  . Smokeless tobacco: Never Used  Substance and Sexual Activity  . Alcohol use: No  . Drug use: No  . Sexual activity: Not on file  Lifestyle  . Physical activity:    Days per week: Not on file    Minutes per session: Not on file  . Stress: Not on file  Relationships  . Social connections:    Talks on phone: Not on file  Gets together: Not on file    Attends religious service: Not on file    Active member of club or organization: Not on file    Attends meetings of clubs or organizations: Not on file    Relationship status: Not on file  Other Topics Concern  . Not on file  Social History Narrative   Patient is right-handed. Karina Greer avoids caffeine. Karina Greer has recently been using the treadmill.   Past Surgical History:  Procedure Laterality Date  . ACHILLES TENDON SURGERY Right 3/16  . ANTERIOR CERVICAL DECOMP/DISCECTOMY FUSION N/A 03/11/2016   Procedure: C5-6, C6-7 Anterior Cervical Discectomy and Fusion, Allograft, Plate;  Surgeon: Marybelle Killings,  MD;  Location: [facility];  Service: Orthopedics;  Laterality: N/A;  . CHOLECYSTECTOMY  2014  . COLONOSCOPY WITH PROPOFOL N/A 08/25/2017   Procedure: COLONOSCOPY WITH PROPOFOL;  Surgeon: Manya Silvas, MD;  Location: [facility] ENDOSCOPY;  Service: Endoscopy;  Laterality: N/A;  . CYSTO WITH HYDRODISTENSION N/A 01/26/2014   Procedure: CYSTOSCOPY/HYDRODISTENSION;  Surgeon: Bernestine Amass, MD;  Location: [facility];  Service: Urology;  Laterality: N/A;  . CYSTO/  URETHRAL DILATION/  HYDRODISTENTION/   INSTILLATION THERAPY  07-16-2010//   12-30-2007//   10-27-2006  . EXERCISE TOLERENCE TEST  10-12-2010   NEGATIVE  ADEQUATE ETT/  NO ISCHEMIA OR EVIDENCE HIGH GRADE OBSTRUCTIVE CAD/  NO FURTHER TEST NEEDED  . [place] ENDOMETRIAL ABLATION  2014  . LAPAROSCOPIC OVARIAN CYST BX  2005   AND  URETEROSCOPIC LASER LITHO  STONE EXTRACTION  . SHOULDER OPEN ROTATOR CUFF REPAIR Right 2004  . TONSILLECTOMY    . TRANSTHORACIC ECHOCARDIOGRAM  06-07-2006   normal study/  ef 60-65%  . TUBAL LIGATION Bilateral 1995   Past Medical History:  Diagnosis Date  . Anxiety   . Anxiety disorder   . Arthritis   . Blepharitis   . Chronic low back pain   . Depression   . Dyslipidemia   . Fibromyalgia   . GERD (gastroesophageal reflux disease)   . Headache    hx migraines  . History of kidney stones   . History of panic attacks   . History of renal calculi   . IBS (irritable bowel syndrome)   . Interstitial cystitis   . Lupus ([place])    tested positive for antibodies for lupus. dr to do further tests  . Menorrhagia   . OSA on CPAP    not used cpap for several weeks  . Pneumonia    hx  . PONV (postoperative nausea and vomiting)   . RLS (restless legs syndrome)   . Rosacea   . Seasonal asthma   . SI (sacroiliac) joint dysfunction   . SUI (stress urinary incontinence, female)   . UTI (lower urinary tract infection)    hx  . 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 02/23/2013   BP (!)  132/93   Pulse 65   Ht 5\' 3"  (1.6 m)   Wt 231 lb (104.8 kg)   SpO2 96%   BMI 40.92 kg/m   Opioid Risk Score:   Fall Risk Score:  `1  Depression screen PHQ 2/9  Depression screen [facility] 2/9 08/12/2018 07/20/2018 06/19/2018 04/20/2018 01/29/2018 01/28/2018 12/15/2017  Decreased Interest 0 1 1 0 0 1 2  Down, Depressed, Hopeless 0 1 1 0 0 1 2  PHQ - 2 Score 0 2 2 0 0 2 4  Altered sleeping - - - - - - -  Tired,  decreased energy - - - - - - -  Change in appetite - - - - - - -  Feeling bad or failure about yourself  - - - - - - -  Trouble concentrating - - - - - - -  Moving slowly or fidgety/restless - - - - - - -  Suicidal thoughts - - - - - - -  PHQ-9 Score - - - - - - -  Difficult doing work/chores - - - - - - -  Some recent data might be hidden    Review of Systems  Constitutional: Negative.   HENT: Negative.   Eyes: Negative.   Respiratory: Positive for apnea.   Cardiovascular: Negative.   Gastrointestinal: Negative.   Endocrine: Negative.   Genitourinary: Negative.   Musculoskeletal: Positive for arthralgias, back pain, myalgias, neck pain and neck stiffness. Negative for joint swelling.  Skin: Negative.   Allergic/Immunologic: Negative.   Neurological: Negative.   Hematological: Negative.   Psychiatric/Behavioral: The patient is nervous/anxious.   All other systems reviewed and are negative.      Objective:   Physical Exam Vitals signs and nursing note reviewed.  Constitutional:      Appearance: Normal appearance.  Neck:     Musculoskeletal: Normal range of motion and neck supple.  Cardiovascular:     Rate and Rhythm: Normal rate and regular rhythm.     Pulses: Normal pulses.     Heart sounds: Normal heart sounds.  Pulmonary:     Effort: Pulmonary effort is normal.     Breath sounds: Normal breath sounds.  Musculoskeletal:     Comments: Normal Muscle Bulk and Muscle Testing Reveals:  Upper Extremities: Full ROM and Muscle Strength 5/5 Bilateral AC Joint  Tenderness   Thoracic Paraspinal Tenderness: T-1-T-3 T-7-T-9 Lumbar Paraspinal Tenderness: L-4-L-5 Bilateral Greater Trochanter Tenderness Lower Extremities: Full ROM and Muscle Strength 5/5 Arises from chair with ease Narrow Based Gait   Skin:    General: Skin is warm and dry.  Neurological:     Mental Status: Karina Greer is alert and oriented to person, place, and time.  Psychiatric:        Mood and Affect: Mood normal.        Behavior: Behavior normal.           Assessment & Plan:  1. Lumbago/ Lumbar Spondylosis/ Right Lumbar Radiculitis: Continue to Monitor.10/20/2018. Refilled: HYDROcodone 10/325mg  one tablet every8hours as needed #90.  We will continue the opioid monitoring program, this consists of regular clinic visits, examinations, urine drug screen, pill counts as well as use of [place] Controlled Substance Reporting System. 2. Fibromyalgia. Continue Current exercise Regime.10/20/2018 3. Anxiety and depression:Psychiatry Following: Seeing.[facility]icaat [facility]. Continue Counseling at [facility]. On Xanax,10/20/2018 4. Migraines: On Maxalt. Neurology Following.10/20/2018 5. OSA : Continue exercise regime as tolerated and losing weight.10/20/2018 6. Obesity: Following Healthy Diet Regimen.10/20/2018 7. Status Post Cervical Spinal Fusion: C5C6- C-6- C-7 Anterior Cervical Discectomy Fusion and Allograft Plate:Dr. Lorin Mercy Following.10/20/2018 8. Cervicalgia/ Cervical Radiculitis/ S/P Cervical Spinal Fusion: Continueto MonitorDr. Yates Following.10/20/2018 9. Muscle Spasm: Continue Tizanidine as needed.10/20/2018 10.Hereditary and Idiopathic Peripheral Neuropathy Continue with Tens Unit. 10/20/2018 11. Bilateral Greater Trochanteric Bursitis: Continue to Alternate Ice and eat Therapy. Continue to Monitor. 10/20/2018..  20 minutes of face to face patient care time was spent during this visit. All questions were encouraged and answered.   F/U  in 41month

## 2018-10-22 ENCOUNTER — Ambulatory Visit (INDEPENDENT_AMBULATORY_CARE_PROVIDER_SITE_OTHER): Payer: Medicare Other | Admitting: Internal Medicine

## 2018-10-22 ENCOUNTER — Encounter: Payer: Self-pay | Admitting: Internal Medicine

## 2018-10-22 VITALS — BP 102/68 | HR 62 | Resp 16 | Ht 63.0 in | Wt 230.0 lb

## 2018-10-22 DIAGNOSIS — J0110 Acute frontal sinusitis, unspecified: Secondary | ICD-10-CM

## 2018-10-22 DIAGNOSIS — Z9989 Dependence on other enabling machines and devices: Secondary | ICD-10-CM

## 2018-10-22 DIAGNOSIS — K219 Gastro-esophageal reflux disease without esophagitis: Secondary | ICD-10-CM

## 2018-10-22 DIAGNOSIS — J301 Allergic rhinitis due to pollen: Secondary | ICD-10-CM

## 2018-10-22 DIAGNOSIS — G4733 Obstructive sleep apnea (adult) (pediatric): Secondary | ICD-10-CM | POA: Diagnosis not present

## 2018-10-22 MED ORDER — AZITHROMYCIN 250 MG PO TABS: ORAL_TABLET | ORAL | 0 refills | Status: DC

## 2018-10-22 NOTE — Progress Notes (Signed)
[facility] [place], [place] [address]  Pulmonary Sleep Medicine   Office Visit Note  Patient Name: Karina Greer DOB: 09-05-1966 MRN [id number]  Date of Service: 10/22/2018  Complaints/HPI: Sinus congestion Pain. Ear pain she states that she has been doing well other than more recently has been having some congestion and pain in her frontal sinus area.  Patient also has been noting some pain in her ears.  She has had some drip and drain also.  She is using her CPAP as ordered and is also getting her allergy shots as ordered  ROS  General: (-) fever, (-) chills, (-) night sweats, (-) weakness Skin: (-) rashes, (-) itching,. Eyes: (-) visual changes, (-) redness, (-) itching. Nose and Sinuses: (-) nasal stuffiness or itchiness, (-) postnasal drip, (-) nosebleeds, (-) sinus trouble. Mouth and Throat: (-) sore throat, (-) hoarseness. Neck: (-) swollen glands, (-) enlarged thyroid, (-) neck pain. Respiratory: + cough, (-) bloody sputum, + shortness of breath, - wheezing. Cardiovascular: - ankle swelling, (-) chest pain. Lymphatic: (-) lymph node enlargement. Neurologic: (-) numbness, (-) tingling. Psychiatric: (-) anxiety, (-) depression   Current Medication: Outpatient Encounter Medications as of 10/22/2018  Medication Sig  . albuterol (PROVENTIL) (2.5 MG/3ML) 0.083% nebulizer solution INHALE 1 VIAL VIA NEBULIZER EVERY 6 HOURS AS NEEDED  . ALPRAZolam (XANAX) 0.25 MG tablet Take 0.25 mg by mouth 3 (three) times daily as needed for anxiety.  [name] azelastine (ASTELIN) 0.1 % nasal spray USE 2 SPRAYS INTO EACH NOSTRIL TWICE A DAY  . b complex vitamins tablet Take 1 tablet by mouth daily.  [name] DEXILANT 30 MG capsule Take 1 capsule by mouth 2 (two) times daily.  [name] dicyclomine (BENTYL) 20 MG tablet Take 20 mg by mouth every 6 (six) hours.  [name] EPIPEN 2-PAK 0.3 MG/0.3ML SOAJ injection Use as directed  . FLOVENT HFA 110 MCG/ACT inhaler INHALE 1 PUFF BY MOUTH TWICE A DAY   . HYDROcodone-acetaminophen (NORCO) 10-325 MG tablet TAKE 1 TABLET BY MOUTH EVERY 8 HOURS AS NEEDED FOR PAIN  . hydrOXYzine (ATARAX/VISTARIL) 10 MG tablet Take 10 mg by mouth 3 (three) times daily as needed.  [name] JANUMET XR [phone] MG TB24   . meclizine (ANTIVERT) 25 MG tablet Take 1 tablet by mouth 3 (three) times daily as needed.  . montelukast (SINGULAIR) 10 MG tablet Take 10 mg by mouth every morning.  . ondansetron (ZOFRAN-ODT) 4 MG disintegrating tablet Take 1 tablet by mouth 3 (three) times daily as needed.  [name] PRESCRIPTION MEDICATION every 14 (fourteen) days. 2 ALLERGY INJECTIONS EVERY 2 WKS  . PROAIR HFA 108 (90 Base) MCG/ACT inhaler INHALE 2 PUFFS BY MOUTH EVERY 4-6 HOURS AS NEEDED  . rosuvastatin (CRESTOR) 10 MG tablet   . sucralfate (CARAFATE) 1 g tablet Take 1 g by mouth 4 (four) times daily -  with meals and at bedtime.  [name] tiZANidine (ZANAFLEX) 2 MG tablet Take 1 tablet (2 mg total) by mouth every 6 (six) hours as needed.  . Vitamin D, Ergocalciferol, (DRISDOL) 50000 units CAPS capsule Take 50,000 Units by mouth every 7 (seven) days. THURSDAYS   No facility-administered encounter medications on file as of 10/22/2018.     Surgical History: Past Surgical History:  Procedure Laterality Date  . ACHILLES TENDON SURGERY Right 3/16  . ANTERIOR CERVICAL DECOMP/DISCECTOMY FUSION N/A 03/11/2016   Procedure: C5-6, C6-7 Anterior Cervical Discectomy and Fusion, Allograft, Plate;  Surgeon: Marybelle Killings, MD;  Location: [facility];  Service: Orthopedics;  Laterality: N/A;  . CHOLECYSTECTOMY  2014  . COLONOSCOPY WITH PROPOFOL N/A 08/25/2017   Procedure: COLONOSCOPY WITH PROPOFOL;  Surgeon: Manya Silvas, MD;  Location: [facility] ENDOSCOPY;  Service: Endoscopy;  Laterality: N/A;  . CYSTO WITH HYDRODISTENSION N/A 01/26/2014   Procedure: CYSTOSCOPY/HYDRODISTENSION;  Surgeon: Bernestine Amass, MD;  Location: [facility];  Service: Urology;  Laterality: N/A;  . CYSTO/  URETHRAL DILATION/   HYDRODISTENTION/   INSTILLATION THERAPY  07-16-2010//   12-30-2007//   10-27-2006  . EXERCISE TOLERENCE TEST  10-12-2010   NEGATIVE  ADEQUATE ETT/  NO ISCHEMIA OR EVIDENCE HIGH GRADE OBSTRUCTIVE CAD/  NO FURTHER TEST NEEDED  . [place] ENDOMETRIAL ABLATION  2014  . LAPAROSCOPIC OVARIAN CYST BX  2005   AND  URETEROSCOPIC LASER LITHO  STONE EXTRACTION  . SHOULDER OPEN ROTATOR CUFF REPAIR Right 2004  . TONSILLECTOMY    . TRANSTHORACIC ECHOCARDIOGRAM  06-07-2006   normal study/  ef 60-65%  . TUBAL LIGATION Bilateral 1995    Medical History: Past Medical History:  Diagnosis Date  . Anxiety   . Anxiety disorder   . Arthritis   . Blepharitis   . Chronic low back pain   . Depression   . Dyslipidemia   . Fibromyalgia   . GERD (gastroesophageal reflux disease)   . Headache    hx migraines  . History of kidney stones   . History of panic attacks   . History of renal calculi   . IBS (irritable bowel syndrome)   . Interstitial cystitis   . Lupus ([place])    tested positive for antibodies for lupus. dr to do further tests  . Menorrhagia   . OSA on CPAP    not used cpap for several weeks  . Pneumonia    hx  . PONV (postoperative nausea and vomiting)   . RLS (restless legs syndrome)   . Rosacea   . Seasonal asthma   . SI (sacroiliac) joint dysfunction   . SUI (stress urinary incontinence, female)   . UTI (lower urinary tract infection)    hx  . 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 02/23/2013    Family History: Family History  Problem Relation Age of Onset  . Hypertension Mother   . Diabetes Father   . Brain cancer Father   . Fibromyalgia Sister   . Suicidality Sister   . Hypertension Other        Cancer, Cerebrovascular disease run on mother side of family    Social History: Social History   Socioeconomic History  . Marital status: Divorced    Spouse name: Not on file  . Number of children: 3  . Years of education: HS  . Highest education level:  12th grade  Occupational History  . Occupation: Merchandiser, retail: UNEMPLOYED    Comment: Disability  Social Needs  . Financial resource strain: Not on file  . Food insecurity:    Worry: Not on file    Inability: Not on file  . Transportation needs:    Medical: Not on file    Non-medical: Not on file  Tobacco Use  . Smoking status: Never Smoker  . Smokeless tobacco: Never Used  Substance and Sexual Activity  . Alcohol use: No  . Drug use: No  . Sexual activity: Not on file  Lifestyle  . Physical activity:    Days per week: Not on file    Minutes per session: Not on file  .  Stress: Not on file  Relationships  . Social connections:    Talks on phone: Not on file    Gets together: Not on file    Attends religious service: Not on file    Active member of club or organization: Not on file    Attends meetings of clubs or organizations: Not on file    Relationship status: Not on file  . Intimate partner violence:    Fear of current or ex partner: Not on file    Emotionally abused: Not on file    Physically abused: Not on file    Forced sexual activity: Not on file  Other Topics Concern  . Not on file  Social History Narrative   Patient is right-handed. She avoids caffeine. She has recently been using the treadmill.    Vital Signs: Blood pressure 102/68, pulse 62, resp. rate 16, height 5\' 3"  (1.6 m), weight 230 lb (104.3 kg), SpO2 98 %.  Examination: General Appearance: The patient is well-developed, well-nourished, and in no distress. Skin: Gross inspection of skin unremarkable. Head: normocephalic, no gross deformities. Eyes: no gross deformities noted. ENT: ears appear grossly normal no exudates. Neck: Supple. No thyromegaly. No LAD. Respiratory: no rhonchi noted at this time. Cardiovascular: Normal S1 and S2 without murmur or rub. Extremities: No cyanosis. pulses are equal. Neurologic: Alert and oriented. No involuntary movements.  LABS: No results found  for this or any previous visit (from the past 2160 hour(s)).  Radiology: Mr Brain Wo Contrast  Result Date: 02/14/2018 CLINICAL DATA:  Vertigo with nausea for 1 year. Concussion 3 weeks ago. Headaches. EXAM: MRI HEAD WITHOUT CONTRAST TECHNIQUE: Multiplanar, multiecho pulse sequences of the brain and surrounding structures were obtained without intravenous contrast. COMPARISON:  None. FINDINGS: Brain: No evidence for acute infarction, hemorrhage, mass lesion, hydrocephalus, or extra-axial fluid. Normal for age cerebral volume. Moderately advanced subcortical greater than periventricular white matter signal abnormality, frontal lobe predominant, nonspecific. Chronic microvascular ischemic change is favored. Complicated migraine, vasculitis, chronic infection, or demyelinating disease are other considerations. Vascular: Flow voids are maintained throughout the carotid, basilar, and vertebral arteries. There are no areas of chronic hemorrhage. No posttraumatic sequelae are evident. Skull and upper cervical spine: Normal marrow signal. Upper cervical region unremarkable. Incompletely visualized C5-C7 fusion. Sinuses/Orbits: No significant sinus opacity.  Negative orbits. Other: No mastoid fluid.  Extracranial soft tissues unremarkable. IMPRESSION: Nonspecific, but abnormal, subcortical greater than periventricular white matter signal abnormality. No large vessel/cortical infarct. Etiology is unclear. No posttraumatic sequelae are evident. Electronically Signed   By: Staci Righter M.D.   On: 02/14/2018 14:16    No results found.  No results found.    Assessment and Plan: Patient Active Problem List   Diagnosis Date Noted  . Hypothyroidism 07/20/2018  . LLQ pain 12/03/2017  . Pre-diabetes 06/03/2017  . Rectal bleeding 06/02/2017  . S/P cervical spinal fusion 03/11/2016  . Bilateral hand pain 11/29/2015  . Elevated C-reactive protein 11/29/2015  . Elevated rheumatoid factor 11/29/2015  . Raynaud's  phenomenon without gangrene 11/29/2015  . Bloating 09/12/2014  . Dizziness 08/08/2014  . Peri-menopause 08/08/2014  . Chronic tension-type headache, intractable 07/13/2014  . Gastroesophageal reflux disease without esophagitis 07/12/2014  . Anxiety 04/18/2014  . Chronic arthritis 04/18/2014  . Cystitis 04/18/2014  . Morbid obesity ([place]) 04/18/2014  . Fibromyalgia 04/18/2014  . 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 Arthropathy 04/18/2014  . Cephalalgia 04/04/2014  . Numbness and tingling 04/04/2014  . White  matter abnormality on MRI of brain 02/23/2013  . Nonspecific abnormal findings on radiological and other examination of skull and head 02/23/2013  . Other nonspecific abnormal result of function study of brain and central nervous system 07/01/2012  . Disturbance of skin sensation 07/01/2012  . Migraine without aura 07/01/2012  . Abdominal pain 02/11/2012  . Chronic fatigue syndrome 02/11/2012  . Chronic interstitial cystitis 02/11/2012  . Dyspareunia 02/11/2012  . Dysuria 02/11/2012  . Female stress incontinence 02/11/2012  . Irritable bowel syndrome with diarrhea 02/11/2012  . Nausea and vomiting 02/11/2012  . Nocturia 02/11/2012  . Urge incontinence 02/11/2012  . Urinary urgency 02/11/2012  . Lumbar spondylosis 02/04/2012  . Myalgia and myositis 02/04/2012  . Depression 02/04/2012  . Lumbosacral spondylosis without myelopathy 02/04/2012  . OBESITY, UNSPECIFIED 10/03/2010  . GENERALIZED ANXIETY DISORDER 10/03/2010  . CHEST PAIN, PRECORDIAL 10/03/2010    1. Allergic Rhinitis continue with present regimen she is going to continue with the Astelin nasal spray as well as the Nasacort.  Spoke to her about antihistamine use also which might help to keep her upper airways more clear so that she does not keep getting sinusitis 2. Sinusitis appears to be acute gave her a Z-Pak to take if this does not improve then we will  reassess 3. OSA she has good compliance continue to encourage compliance with the CPAP therapy as ordered 4. GERD controlled 5. Morbid obesity needs to work on diet and exercise and weight loss  General Counseling: I have discussed the findings of the evaluation and examination with Chaniyah.  I have also discussed any further diagnostic evaluation thatmay be needed or ordered today. Travonna verbalizes understanding of the findings of todays visit. We also reviewed her medications today and discussed drug interactions and side effects including but not limited excessive drowsiness and altered mental states. We also discussed that there is always a risk not just to her but also people around her. she has been encouraged to call the office with any questions or concerns that should arise related to todays visit.    Time spent: 15 minutes  I have personally obtained a history, examined the patient, evaluated laboratory and imaging results, formulated the assessment and plan and placed orders.    Allyne Gee, MD [facility] Pulmonary and Critical Care Sleep medicine

## 2018-10-22 NOTE — Patient Instructions (Signed)

## 2018-10-26 DIAGNOSIS — I700 Atherosclerosis of aorta: Secondary | ICD-10-CM | POA: Diagnosis not present

## 2018-10-26 DIAGNOSIS — J019 Acute sinusitis, unspecified: Secondary | ICD-10-CM | POA: Diagnosis not present

## 2018-10-26 DIAGNOSIS — E7800 Pure hypercholesterolemia, unspecified: Secondary | ICD-10-CM | POA: Diagnosis not present

## 2018-10-26 DIAGNOSIS — R0789 Other chest pain: Secondary | ICD-10-CM | POA: Diagnosis not present

## 2018-10-28 ENCOUNTER — Encounter: Payer: Self-pay | Admitting: Cardiovascular Disease

## 2018-10-28 ENCOUNTER — Ambulatory Visit (INDEPENDENT_AMBULATORY_CARE_PROVIDER_SITE_OTHER): Payer: Medicare Other | Admitting: Cardiovascular Disease

## 2018-10-28 VITALS — BP 124/78 | HR 70 | Ht 63.0 in | Wt 232.0 lb

## 2018-10-28 DIAGNOSIS — R7309 Other abnormal glucose: Secondary | ICD-10-CM | POA: Diagnosis not present

## 2018-10-28 DIAGNOSIS — E782 Mixed hyperlipidemia: Secondary | ICD-10-CM | POA: Diagnosis not present

## 2018-10-28 DIAGNOSIS — I700 Atherosclerosis of aorta: Secondary | ICD-10-CM | POA: Insufficient documentation

## 2018-10-28 DIAGNOSIS — R079 Chest pain, unspecified: Secondary | ICD-10-CM | POA: Diagnosis not present

## 2018-10-28 DIAGNOSIS — M797 Fibromyalgia: Secondary | ICD-10-CM

## 2018-10-28 NOTE — Progress Notes (Signed)
Cardiology Office Note  Date:  10/28/2018   ID:  MCKINZY FULLER, DOB 1966/09/08, MRN [id number]  PCP:  Cyndi Bender, PA-C   Chief Complaint  Patient presents with  . New Patient (Initial Visit)    Atypical CP.off and on more frequently. former [place]ochrein pt. Medications reviewed verbally with patient.    HPI:  [name] is a 53 year old woman with past medical history of Morbid obesity Asthma Neck pain Sleep apnea on CPAP  Hyperlipidemia Referred by Cyndi Bender for consultation of her atypical chest pain  She reports that everything was fine until she had rocky [place] spotted fever in 2000 She developed flu sx, put in hopsital, spinal tap, fever 105 per patient Took medication, Got better, then started getting sick from " other things"  Cystitis, fibromyalgias,  Dr. Tiffany Kocher: IBS  Reports having some chronic chest pain Sx started on left side,  Now on the right side Does not hurt with palpation or with exertion Reports she has been previously diagnosed with costochondritis  Having leg pain, hands numb Neck surgery, some mild chronic neck pain  restless leg,   Total chol 247 (?) Off crestor, recently (felt bad, myalgias)  No regular exercise program Denies any significant symptoms with exertion  EKG personally reviewed by myself on todays visit Shows normal sinus rhythm with rate 70 bpm no significant ST or T wave changes  CT scan abdomen pelvis from 2018 in 2019 reviewed No significant coronary calcification noted, very minimal aortic atherosclerosis noted in the distal aorta, iliac vessel Images pulled up and reviewed with her.  Confirmed minimal athero-  Lab work reviewed with her showing total cholesterol 246 LDL 177 vitamin D 47 TSH 2.4 hematocrit 46 creatinine 0.76 normal LFTs normal B12   PMH:   has a past medical history of Anxiety, Anxiety disorder, Arthritis, Blepharitis, Chronic low back pain, Depression, Dyslipidemia, Fibromyalgia, GERD  (gastroesophageal reflux disease), Headache, History of kidney stones, History of panic attacks, History of renal calculi, IBS (irritable bowel syndrome), Interstitial cystitis, Lupus ([place]), Menorrhagia, OSA on CPAP, Pneumonia, PONV (postoperative nausea and vomiting), RLS (restless legs syndrome), Rosacea, Seasonal asthma, SI (sacroiliac) joint dysfunction, SUI (stress urinary incontinence, female), UTI (lower urinary tract infection), and 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 (02/23/2013).  PSH:    Past Surgical History:  Procedure Laterality Date  . ACHILLES TENDON SURGERY Right 3/16  . ANTERIOR CERVICAL DECOMP/DISCECTOMY FUSION N/A 03/11/2016   Procedure: C5-6, C6-7 Anterior Cervical Discectomy and Fusion, Allograft, Plate;  Surgeon: Marybelle Killings, MD;  Location: [place];  Service: Orthopedics;  Laterality: N/A;  . CHOLECYSTECTOMY  2014  . COLONOSCOPY WITH PROPOFOL N/A 08/25/2017   Procedure: COLONOSCOPY WITH PROPOFOL;  Surgeon: Manya Silvas, MD;  Location: [facility] ENDOSCOPY;  Service: Endoscopy;  Laterality: N/A;  . CYSTO WITH HYDRODISTENSION N/A 01/26/2014   Procedure: CYSTOSCOPY/HYDRODISTENSION;  Surgeon: Bernestine Amass, MD;  Location: [facility];  Service: Urology;  Laterality: N/A;  . CYSTO/  URETHRAL DILATION/  HYDRODISTENTION/   INSTILLATION THERAPY  07-16-2010//   12-30-2007//   10-27-2006  . EXERCISE TOLERENCE TEST  10-12-2010   NEGATIVE  ADEQUATE ETT/  NO ISCHEMIA OR EVIDENCE HIGH GRADE OBSTRUCTIVE CAD/  NO FURTHER TEST NEEDED  . [place] ENDOMETRIAL ABLATION  2014  . LAPAROSCOPIC OVARIAN CYST BX  2005   AND  URETEROSCOPIC LASER LITHO  STONE EXTRACTION  . SHOULDER OPEN ROTATOR CUFF REPAIR Right 2004  . TONSILLECTOMY    . TRANSTHORACIC  ECHOCARDIOGRAM  06-07-2006   normal study/  ef 60-65%  . TUBAL LIGATION Bilateral 1995    Current Outpatient Medications  Medication Sig Dispense Refill  . albuterol (PROVENTIL) (2.5 MG/3ML) 0.083% nebulizer  solution INHALE 1 VIAL VIA NEBULIZER EVERY 6 HOURS AS NEEDED  1  . ALPRAZolam (XANAX) 0.25 MG tablet Take 0.25 mg by mouth 2 (two) times daily as needed for anxiety.     [name] azelastine (ASTELIN) 0.1 % nasal spray USE 2 SPRAYS INTO EACH NOSTRIL TWICE A DAY 16 mL 1  . DEXILANT 30 MG capsule Take 1 capsule by mouth 2 (two) times daily.    [name] dicyclomine (BENTYL) 20 MG tablet Take 20 mg by mouth every 6 (six) hours.    [name] EPIPEN 2-PAK 0.3 MG/0.3ML SOAJ injection Use as directed 1 Device 1  . FLOVENT HFA 110 MCG/ACT inhaler INHALE 1 PUFF BY MOUTH TWICE A DAY 12 Inhaler 2  . HYDROcodone-acetaminophen (NORCO) 10-325 MG tablet TAKE 1 TABLET BY MOUTH EVERY 8 HOURS AS NEEDED FOR PAIN 90 tablet 0  . meclizine (ANTIVERT) 25 MG tablet Take 1 tablet by mouth 3 (three) times daily as needed.    . montelukast (SINGULAIR) 10 MG tablet Take 10 mg by mouth every morning.    . ondansetron (ZOFRAN-ODT) 4 MG disintegrating tablet Take 1 tablet by mouth 3 (three) times daily as needed.    [name] PRESCRIPTION MEDICATION every 14 (fourteen) days. 2 ALLERGY INJECTIONS EVERY 2 WKS    . PROAIR HFA 108 (90 Base) MCG/ACT inhaler INHALE 2 PUFFS BY MOUTH EVERY 4-6 HOURS AS NEEDED 8.5 Inhaler 1  . sucralfate (CARAFATE) 1 g tablet Take 1 g by mouth 4 (four) times daily -  with meals and at bedtime.    [name] tiZANidine (ZANAFLEX) 2 MG tablet Take 1 tablet (2 mg total) by mouth every 6 (six) hours as needed. 90 tablet 3  . Vitamin D, Ergocalciferol, (DRISDOL) 50000 units CAPS capsule Take 50,000 Units by mouth every 7 (seven) days. THURSDAYS    . b complex vitamins tablet Take 1 tablet by mouth daily.     No current facility-administered medications for this visit.      Allergies:   Alendronate sodium; Doxycycline; Milnacipran hcl; Elavil [amitriptyline hcl]; Tramadol; Codeine sulfate; Cymbalta [duloxetine hcl]; Duloxetine; Lamotrigine; Morphine; Nsaids; Other; Skelaxin; Soolantra [ivermectin]; Tessalon [benzonatate]; Oxycodone;  Oxycodone-acetaminophen; and Tetracycline   Social History:  The patient  reports that she has never smoked. She has never used smokeless tobacco. She reports that she does not drink alcohol or use drugs.   Family History:   family history includes Brain cancer in her father; Diabetes in her father; Fibromyalgia in her sister; Hypertension in her mother and another family member; Suicidality in her sister.    Review of Systems: Review of Systems  Constitutional: Negative.   Respiratory: Negative.   Cardiovascular: Positive for chest pain.  Gastrointestinal: Negative.   Musculoskeletal: Positive for myalgias.  Neurological: Negative.   Psychiatric/Behavioral: Negative.   All other systems reviewed and are negative.   PHYSICAL EXAM: VS:  BP 124/78 (BP Location: Right Arm, Patient Position: Sitting, Cuff Size: Large)   Pulse 70   Ht 5\' 3"  (1.6 m)   Wt 232 lb (105.2 kg)   BMI 41.10 kg/m  , BMI Body mass index is 41.1 kg/m. GEN: Well nourished, well developed, in no acute distress , obese HEENT: normal  Neck: no JVD, carotid bruits, or masses Cardiac: RRR; no murmurs, rubs, or gallops,no edema  Respiratory:  clear to auscultation bilaterally, normal work of breathing GI: soft, nontender, nondistended, + BS MS: no deformity or atrophy  Skin: warm and dry, no rash Neuro:  Strength and sensation are intact Psych: euthymic mood, full affect   Recent Labs: 07/20/2018: TSH 4.940    Lipid Panel No results found for: CHOL, HDL, LDLCALC, TRIG    Wt Readings from Last 3 Encounters:  10/28/18 232 lb (105.2 kg)  10/22/18 230 lb (104.3 kg)  10/20/18 231 lb (104.8 kg)      ASSESSMENT AND PLAN:  Atherosclerosis of aorta (HCC) Minimal disease noted CT scan images pulled up and reviewed with her in detail from 2018 and 2019 No coronary calcification noted in the mid to distal vessels No further work-up needed  Morbid obesity ([place]) We have encouraged continued exercise,  careful diet management in an effort to lose weight.  Chest pain, unspecified type - Plan: EKG 12-Lead Very atypical in nature, given her minimal coronary calcification, minimal if any aortic atherosclerosis, no further work-up  Fibromyalgia Recommended weight loss, walking program, low carbohydrate diet  Elevated glucose As above recommended some dietary changes which she has already started  Mixed hyperlipidemia Total cholesterol 247, minimal plaque noted on CT scans after 53 years She could take Zetia though she does have some GI pathology Did not seem to do well on Crestor, possible myalgias Suggest she try to do this naturally, weight loss, dietary changes given minimal plaque noted  Disposition:   F/U as needed  Long discussion with her concerning her risk factors Several CT scans pulled up and images reviewed in detail Long discussion concerning her chest pain, atherosclerosis, management of her risk factors and diet  Total encounter time more than 60 minutes  Greater than 50% was spent in counseling and coordination of care with the patient    Orders Placed This Encounter  Procedures  . EKG 12-Lead     Signed, Esmond Plants, M.D., Ph.D. 10/28/2018  [place], [place]

## 2018-10-28 NOTE — Patient Instructions (Signed)

## 2018-11-02 MED FILL — HYDROCODON-APAP 10-325: 10-325 | 30 days supply | Qty: 90 | Fill #0

## 2018-11-03 ENCOUNTER — Other Ambulatory Visit: Payer: Self-pay | Admitting: Adult Health

## 2018-11-04 ENCOUNTER — Ambulatory Visit (INDEPENDENT_AMBULATORY_CARE_PROVIDER_SITE_OTHER): Payer: Medicare Other

## 2018-11-04 DIAGNOSIS — N750 Cyst of Bartholin's gland: Secondary | ICD-10-CM | POA: Insufficient documentation

## 2018-11-04 DIAGNOSIS — N9489 Other specified conditions associated with female genital organs and menstrual cycle: Secondary | ICD-10-CM | POA: Diagnosis not present

## 2018-11-04 DIAGNOSIS — J301 Allergic rhinitis due to pollen: Secondary | ICD-10-CM

## 2018-11-11 DIAGNOSIS — L853 Xerosis cutis: Secondary | ICD-10-CM | POA: Diagnosis not present

## 2018-11-11 DIAGNOSIS — B078 Other viral warts: Secondary | ICD-10-CM | POA: Diagnosis not present

## 2018-11-11 DIAGNOSIS — L719 Rosacea, unspecified: Secondary | ICD-10-CM | POA: Diagnosis not present

## 2018-11-18 ENCOUNTER — Ambulatory Visit (INDEPENDENT_AMBULATORY_CARE_PROVIDER_SITE_OTHER): Payer: Medicare Other

## 2018-11-18 DIAGNOSIS — J301 Allergic rhinitis due to pollen: Secondary | ICD-10-CM | POA: Diagnosis not present

## 2018-11-20 ENCOUNTER — Encounter: Payer: Self-pay | Admitting: Registered Nurse

## 2018-11-20 ENCOUNTER — Encounter: Payer: Medicare Other | Attending: Physical Medicine & Rehabilitation | Admitting: Registered Nurse

## 2018-11-20 VITALS — BP 136/89 | HR 69 | Ht 63.0 in | Wt 229.8 lb

## 2018-11-20 DIAGNOSIS — F3289 Other specified depressive episodes: Secondary | ICD-10-CM | POA: Diagnosis not present

## 2018-11-20 DIAGNOSIS — M542 Cervicalgia: Secondary | ICD-10-CM

## 2018-11-20 DIAGNOSIS — M47816 Spondylosis without myelopathy or radiculopathy, lumbar region: Secondary | ICD-10-CM

## 2018-11-20 DIAGNOSIS — F419 Anxiety disorder, unspecified: Secondary | ICD-10-CM | POA: Diagnosis not present

## 2018-11-20 DIAGNOSIS — G894 Chronic pain syndrome: Secondary | ICD-10-CM | POA: Diagnosis not present

## 2018-11-20 DIAGNOSIS — R202 Paresthesia of skin: Secondary | ICD-10-CM | POA: Insufficient documentation

## 2018-11-20 DIAGNOSIS — M546 Pain in thoracic spine: Secondary | ICD-10-CM | POA: Diagnosis not present

## 2018-11-20 DIAGNOSIS — G8929 Other chronic pain: Secondary | ICD-10-CM

## 2018-11-20 DIAGNOSIS — R200 Anesthesia of skin: Secondary | ICD-10-CM | POA: Insufficient documentation

## 2018-11-20 DIAGNOSIS — Z5181 Encounter for therapeutic drug level monitoring: Secondary | ICD-10-CM | POA: Diagnosis not present

## 2018-11-20 DIAGNOSIS — Z981 Arthrodesis status: Secondary | ICD-10-CM

## 2018-11-20 DIAGNOSIS — M797 Fibromyalgia: Secondary | ICD-10-CM | POA: Diagnosis not present

## 2018-11-20 DIAGNOSIS — M62838 Other muscle spasm: Secondary | ICD-10-CM | POA: Diagnosis not present

## 2018-11-20 DIAGNOSIS — Z79899 Other long term (current) drug therapy: Secondary | ICD-10-CM | POA: Diagnosis not present

## 2018-11-20 MED ORDER — HYDROCODONE-ACETAMINOPHEN 10-325 MG PO TABS: ORAL_TABLET | ORAL | 0 refills | Status: DC

## 2018-11-20 NOTE — Progress Notes (Signed)
Subjective:    Patient ID: Karina Greer, female    DOB: 07/20/1966, 53 y.o.   MRN: [id number]  HPI: Karina Greer is a 53 y.o. female who returns for follow up appointment for chronic pain and medication refill. She states her pain is located in her mid- lower back. Also repost she thinks she's in the process of pasting a kidney stone, she was instructed to follow up with PCP and Urologist, she verbalizes understanding. She.rates her  Pain 9. Her current exercise regime is walking.   [name] Morphine equivalent is 30.00 MME. She is also prescribed Alprazolam  by Lorelle Gibbs NP. We have discussed the black box warning of using opioids and benzodiazepines. I highlighted the dangers of using these drugs together and discussed the adverse events including respiratory suppression, overdose, cognitive impairment and importance of compliance with current regimen. We will continue to monitor and adjust as indicated.  She  is being closely monitored and under the care of  Psychiatry at [facility].   Last Oral Swab was Performed on 05/15/2018, it was consistent.   Pain Inventory Average Pain 8 Pain Right Now 9 My pain is sharp, stabbing, tingling and aching  In the last 24 hours, has pain interfered with the following? General activity 8 Relation with others 8 Enjoyment of life 8 What TIME of day is your pain at its worst? evening Sleep (in general) Fair  Pain is worse with: walking, bending, sitting, inactivity, standing and some activites Pain improves with: rest, heat/ice, therapy/exercise, pacing activities, medication and TENS Relief from Meds: 6  Mobility walk without assistance ability to climb steps?  yes do you drive?  yes  Function I need assistance with the following:  meal prep, household duties and shopping  Neuro/Psych spasms  Prior Studies Any changes since last visit?  no  Physicians involved in your care Any changes since last visit?  no   Family  History  Problem Relation Age of Onset  . Hypertension Mother   . Diabetes Father   . Brain cancer Father   . Fibromyalgia Sister   . Suicidality Sister   . Hypertension Other        Cancer, Cerebrovascular disease run on mother side of family   Social History   Socioeconomic History  . Marital status: Divorced    Spouse name: Not on file  . Number of children: 3  . Years of education: HS  . Highest education level: 12th grade  Occupational History  . Occupation: Merchandiser, retail: UNEMPLOYED    Comment: Disability  Social Needs  . Financial resource strain: Not on file  . Food insecurity:    Worry: Not on file    Inability: Not on file  . Transportation needs:    Medical: Not on file    Non-medical: Not on file  Tobacco Use  . Smoking status: Never Smoker  . Smokeless tobacco: Never Used  Substance and Sexual Activity  . Alcohol use: No  . Drug use: No  . Sexual activity: Not on file  Lifestyle  . Physical activity:    Days per week: Not on file    Minutes per session: Not on file  . Stress: Not on file  Relationships  . Social connections:    Talks on phone: Not on file    Gets together: Not on file    Attends religious service: Not on file    Active member of club or organization:  Not on file    Attends meetings of clubs or organizations: Not on file    Relationship status: Not on file  Other Topics Concern  . Not on file  Social History Narrative   Patient is right-handed. She avoids caffeine. She has recently been using the treadmill.   Past Surgical History:  Procedure Laterality Date  . ACHILLES TENDON SURGERY Right 3/16  . ANTERIOR CERVICAL DECOMP/DISCECTOMY FUSION N/A 03/11/2016   Procedure: C5-6, C6-7 Anterior Cervical Discectomy and Fusion, Allograft, Plate;  Surgeon: Marybelle Killings, MD;  Location: [place];  Service: Orthopedics;  Laterality: N/A;  . CHOLECYSTECTOMY  2014  . COLONOSCOPY WITH PROPOFOL N/A 08/25/2017   Procedure: COLONOSCOPY WITH  PROPOFOL;  Surgeon: Manya Silvas, MD;  Location: [facility] ENDOSCOPY;  Service: Endoscopy;  Laterality: N/A;  . CYSTO WITH HYDRODISTENSION N/A 01/26/2014   Procedure: CYSTOSCOPY/HYDRODISTENSION;  Surgeon: Bernestine Amass, MD;  Location: [facility];  Service: Urology;  Laterality: N/A;  . CYSTO/  URETHRAL DILATION/  HYDRODISTENTION/   INSTILLATION THERAPY  07-16-2010//   12-30-2007//   10-27-2006  . EXERCISE TOLERENCE TEST  10-12-2010   NEGATIVE  ADEQUATE ETT/  NO ISCHEMIA OR EVIDENCE HIGH GRADE OBSTRUCTIVE CAD/  NO FURTHER TEST NEEDED  . [place] ENDOMETRIAL ABLATION  2014  . LAPAROSCOPIC OVARIAN CYST BX  2005   AND  URETEROSCOPIC LASER LITHO  STONE EXTRACTION  . SHOULDER OPEN ROTATOR CUFF REPAIR Right 2004  . TONSILLECTOMY    . TRANSTHORACIC ECHOCARDIOGRAM  06-07-2006   normal study/  ef 60-65%  . TUBAL LIGATION Bilateral 1995   Past Medical History:  Diagnosis Date  . Anxiety   . Anxiety disorder   . Arthritis   . Blepharitis   . Chronic low back pain   . Depression   . Dyslipidemia   . Fibromyalgia   . GERD (gastroesophageal reflux disease)   . Headache    hx migraines  . History of kidney stones   . History of panic attacks   . History of renal calculi   . IBS (irritable bowel syndrome)   . Interstitial cystitis   . Lupus ([place])    tested positive for antibodies for lupus. dr to do further tests  . Menorrhagia   . OSA on CPAP    not used cpap for several weeks  . Pneumonia    hx  . PONV (postoperative nausea and vomiting)   . RLS (restless legs syndrome)   . Rosacea   . Seasonal asthma   . SI (sacroiliac) joint dysfunction   . SUI (stress urinary incontinence, female)   . UTI (lower urinary tract infection)    hx  . 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 02/23/2013   BP 136/89 (BP Location: Left Arm, Patient Position: Sitting, Cuff Size: Normal)   Pulse 69   Ht 5\' 3"  (1.6 m)   Wt 229 lb 12.8 oz (104.2 kg)   SpO2 98%   BMI 40.71  kg/m   Opioid Risk Score:   Fall Risk Score:  `1  Depression screen PHQ 2/9  Depression screen [facility] 2/9 08/12/2018 07/20/2018 06/19/2018 04/20/2018 01/29/2018 01/28/2018 12/15/2017  Decreased Interest 0 1 1 0 0 1 2  Down, Depressed, Hopeless 0 1 1 0 0 1 2  PHQ - 2 Score 0 2 2 0 0 2 4  Altered sleeping - - - - - - -  Tired, decreased energy - - - - - - -  Change in  appetite - - - - - - -  Feeling bad or failure about yourself  - - - - - - -  Trouble concentrating - - - - - - -  Moving slowly or fidgety/restless - - - - - - -  Suicidal thoughts - - - - - - -  PHQ-9 Score - - - - - - -  Difficult doing work/chores - - - - - - -  Some recent data might be hidden    Review of Systems  Constitutional: Negative.   HENT: Negative.   Eyes: Negative.   Respiratory: Negative.   Cardiovascular: Negative.   Gastrointestinal: Positive for nausea.  Endocrine: Negative.   Genitourinary: Negative.   Musculoskeletal: Positive for arthralgias, back pain and myalgias.  Skin: Negative.   Allergic/Immunologic: Negative.   Neurological: Negative.   Hematological: Negative.   Psychiatric/Behavioral: Negative.   All other systems reviewed and are negative.      Objective:   Physical Exam Vitals signs and nursing note reviewed.  Constitutional:      Appearance: Normal appearance.  Neck:     Musculoskeletal: Normal range of motion and neck supple.  Cardiovascular:     Rate and Rhythm: Normal rate and regular rhythm.     Pulses: Normal pulses.     Heart sounds: Normal heart sounds.  Pulmonary:     Effort: Pulmonary effort is normal.     Breath sounds: Normal breath sounds.  Musculoskeletal:     Comments: Normal Muscle Bulk and Muscle Testing Reveals:  Upper Extremities: Full ROM and Muscle Strength 5/5  Thoracic Paraspinal Tenderness: T-7-T-9 Mainly Left Side Lumbar Paraspinal Tenderness: L-3-L-4 Lower Extremities: Full ROM and Muscle Strength 5/5 Arises from chair with ease Narrow Based  Gait   Skin:    General: Skin is warm and dry.  Neurological:     Mental Status: She is alert and oriented to person, place, and time.  Psychiatric:        Mood and Affect: Mood normal.        Behavior: Behavior normal.           Assessment & Plan:  1. Lumbago/ Lumbar Spondylosis/ Right Lumbar Radiculitis: Continue to Monitor.11/20/2018. Refilled: HYDROcodone 10/325mg  one tablet every8hours as needed #90.  We will continue the opioid monitoring program, this consists of regular clinic visits, examinations, urine drug screen, pill counts as well as use of [place] Controlled Substance Reporting System. 2. Fibromyalgia. Continue Current exercise Regime.11/20/2018 3. Anxiety and depression:Psychiatry Following:[facility]ing.[facility]. Continue Counseling at [facility]. On Xanax,11/20/2018 4. Migraines: On Maxalt. Neurology Following.11/20/2018 5. OSA : Continue exercise regime as tolerated and losing weight.11/20/2018 6. Obesity: Following Healthy Diet Regimen.11/20/2018 7. Status Post Cervical Spinal Fusion: C5C6- C-6- C-7 Anterior Cervical Discectomy Fusion and Allograft Plate:Dr. Lorin Mercy Following.11/20/2018 8. Cervicalgia/ Cervical Radiculitis/ S/P Cervical Spinal Fusion: Continueto MonitorDr. Yates Following.11/20/2018 9. Muscle Spasm: Continue Tizanidine as needed.11/20/2018 10.Hereditary and Idiopathic Peripheral Neuropathy Continue with Tens Unit.11/20/2018 11. Bilateral Greater Trochanteric Bursitis: No complaints today. Continue to Alternate Ice and eat Therapy. Continue to Monitor. 11/20/2018.. 12. Nephrolithiasis: Follow up with her PCP and Urologist: She verbalizes understanding.   20 minutes of face to face patient care time was spent during this visit. All questions were encouraged and answered.   F/U in 79month

## 2018-11-24 DIAGNOSIS — R109 Unspecified abdominal pain: Secondary | ICD-10-CM | POA: Diagnosis not present

## 2018-11-24 DIAGNOSIS — J069 Acute upper respiratory infection, unspecified: Secondary | ICD-10-CM | POA: Diagnosis not present

## 2018-11-30 MED FILL — HYDROCODON-APAP 10-325: 10-325 | 30 days supply | Qty: 90 | Fill #0

## 2018-12-02 ENCOUNTER — Ambulatory Visit (INDEPENDENT_AMBULATORY_CARE_PROVIDER_SITE_OTHER): Payer: Medicare Other

## 2018-12-02 DIAGNOSIS — J301 Allergic rhinitis due to pollen: Secondary | ICD-10-CM

## 2018-12-02 DIAGNOSIS — G4733 Obstructive sleep apnea (adult) (pediatric): Secondary | ICD-10-CM

## 2018-12-02 NOTE — Progress Notes (Signed)
95 percentile pressure 11   95th percentile leak 8.7   apnea index 0.8 /hr  apnea-hypopnea index  0.9 /hr   total days used  >4 hr 1 days  total days used <4 hr 3 days  Total compliance 25 percent  Karina Greer is wanting to wearing cpap again, I checked her cpap and supplies.  She will follow up in 3 months.

## 2018-12-09 ENCOUNTER — Ambulatory Visit: Payer: Self-pay | Admitting: Adult Health

## 2018-12-10 DIAGNOSIS — B078 Other viral warts: Secondary | ICD-10-CM | POA: Diagnosis not present

## 2018-12-10 DIAGNOSIS — L608 Other nail disorders: Secondary | ICD-10-CM | POA: Diagnosis not present

## 2018-12-10 DIAGNOSIS — Z6839 Body mass index (BMI) 39.0-39.9, adult: Secondary | ICD-10-CM | POA: Diagnosis not present

## 2018-12-10 DIAGNOSIS — M940 Chondrocostal junction syndrome [Tietze]: Secondary | ICD-10-CM | POA: Diagnosis not present

## 2018-12-10 DIAGNOSIS — J019 Acute sinusitis, unspecified: Secondary | ICD-10-CM | POA: Diagnosis not present

## 2018-12-10 DIAGNOSIS — D23112 Other benign neoplasm of skin of right lower eyelid, including canthus: Secondary | ICD-10-CM | POA: Diagnosis not present

## 2018-12-10 DIAGNOSIS — L738 Other specified follicular disorders: Secondary | ICD-10-CM | POA: Diagnosis not present

## 2018-12-10 DIAGNOSIS — L718 Other rosacea: Secondary | ICD-10-CM | POA: Diagnosis not present

## 2018-12-16 ENCOUNTER — Other Ambulatory Visit: Payer: Self-pay

## 2018-12-16 ENCOUNTER — Ambulatory Visit (INDEPENDENT_AMBULATORY_CARE_PROVIDER_SITE_OTHER): Payer: Medicare Other

## 2018-12-16 DIAGNOSIS — J301 Allergic rhinitis due to pollen: Secondary | ICD-10-CM | POA: Diagnosis not present

## 2018-12-23 ENCOUNTER — Encounter: Payer: Medicare Other | Attending: Physical Medicine & Rehabilitation | Admitting: Registered Nurse

## 2018-12-23 ENCOUNTER — Encounter: Payer: Self-pay | Admitting: Registered Nurse

## 2018-12-23 ENCOUNTER — Other Ambulatory Visit: Payer: Self-pay

## 2018-12-23 VITALS — BP 117/74 | Wt 229.0 lb

## 2018-12-23 DIAGNOSIS — M47816 Spondylosis without myelopathy or radiculopathy, lumbar region: Secondary | ICD-10-CM

## 2018-12-23 DIAGNOSIS — M7061 Trochanteric bursitis, right hip: Secondary | ICD-10-CM

## 2018-12-23 DIAGNOSIS — M5416 Radiculopathy, lumbar region: Secondary | ICD-10-CM | POA: Diagnosis not present

## 2018-12-23 DIAGNOSIS — Z5181 Encounter for therapeutic drug level monitoring: Secondary | ICD-10-CM

## 2018-12-23 DIAGNOSIS — Z981 Arthrodesis status: Secondary | ICD-10-CM | POA: Diagnosis not present

## 2018-12-23 DIAGNOSIS — M797 Fibromyalgia: Secondary | ICD-10-CM

## 2018-12-23 DIAGNOSIS — Z79899 Other long term (current) drug therapy: Secondary | ICD-10-CM

## 2018-12-23 DIAGNOSIS — G894 Chronic pain syndrome: Secondary | ICD-10-CM

## 2018-12-23 DIAGNOSIS — M62838 Other muscle spasm: Secondary | ICD-10-CM

## 2018-12-23 DIAGNOSIS — M542 Cervicalgia: Secondary | ICD-10-CM | POA: Diagnosis not present

## 2018-12-23 DIAGNOSIS — R202 Paresthesia of skin: Secondary | ICD-10-CM | POA: Insufficient documentation

## 2018-12-23 DIAGNOSIS — F3289 Other specified depressive episodes: Secondary | ICD-10-CM

## 2018-12-23 DIAGNOSIS — M7062 Trochanteric bursitis, left hip: Secondary | ICD-10-CM

## 2018-12-23 DIAGNOSIS — F419 Anxiety disorder, unspecified: Secondary | ICD-10-CM | POA: Diagnosis not present

## 2018-12-23 DIAGNOSIS — R200 Anesthesia of skin: Secondary | ICD-10-CM | POA: Insufficient documentation

## 2018-12-23 MED ORDER — HYDROCODONE-ACETAMINOPHEN 10-325 MG PO TABS: ORAL_TABLET | ORAL | 0 refills | Status: DC

## 2018-12-23 NOTE — Progress Notes (Addendum)
Subjective:    Patient ID: Karina Greer, female    DOB: 06/17/1966, 53 y.o.   MRN: [id number]  HPI: Karina Greer is a 53 y.o. female, due to national recommendations of social distancing due to [place] 19, an audio/video telehealth visit is felt to be most appropriate for this patient at this time.  See Chart message from today for the patient's consent to telehealth from [place]. She states her  pain is located in her neck,lower back radiating into her left hip. Also reports right hip pain. She rates her pain 5. Her current exercise regime is walking and performing stretching exercises.  [name] Morphine equivalent is 30.00 MME. She is also prescribed Alprazolam by Lorelle Gibbs Np. We have discussed the black box warning of using opioids and benzodiazepines. I highlighted the dangers of using these drugs together and discussed the adverse events including respiratory suppression, overdose, cognitive impairment and importance of compliance with current regimen. We will continue to monitor and adjust as indicated.   She is being closely monitored and under the care of her psychiatrist at [facility] she verbalizes understanding.    Last Oral Swab was Performed on 05/15/2018, it was consistent.    Pain Inventory Average Pain 7 Pain Right Now 5 My pain is constant, sharp, stabbing and aching  In the last 24 hours, has pain interfered with the following? General activity 8 Relation with others 7 Enjoyment of life 6 What TIME of day is your pain at its worst? alll Sleep (in general) Fair  Pain is worse with: walking, bending, sitting, inactivity, standing and some activites Pain improves with: rest, heat/ice, therapy/exercise, pacing activities, medication and TENS Relief from Meds: 6  Mobility walk without assistance ability to climb steps?  yes do you drive?  yes  Function disabled: date disabled .  Neuro/Psych spasms dizziness  depression anxiety  Prior Studies Any changes since last visit?  no  Physicians involved in your care Any changes since last visit?  no   Family History  Problem Relation Age of Onset  . Hypertension Mother   . Diabetes Father   . Brain cancer Father   . Fibromyalgia Sister   . Suicidality Sister   . Hypertension Other        Cancer, Cerebrovascular disease run on mother side of family   Social History   Socioeconomic History  . Marital status: Divorced    Spouse name: Not on file  . Number of children: 3  . Years of education: HS  . Highest education level: 12th grade  Occupational History  . Occupation: Merchandiser, retail: UNEMPLOYED    Comment: Disability  Social Needs  . Financial resource strain: Not on file  . Food insecurity:    Worry: Not on file    Inability: Not on file  . Transportation needs:    Medical: Not on file    Non-medical: Not on file  Tobacco Use  . Smoking status: Never Smoker  . Smokeless tobacco: Never Used  Substance and Sexual Activity  . Alcohol use: No  . Drug use: No  . Sexual activity: Not on file  Lifestyle  . Physical activity:    Days per week: Not on file    Minutes per session: Not on file  . Stress: Not on file  Relationships  . Social connections:    Talks on phone: Not on file    Gets together: Not on  file    Attends religious service: Not on file    Active member of club or organization: Not on file    Attends meetings of clubs or organizations: Not on file    Relationship status: Not on file  Other Topics Concern  . Not on file  Social History Narrative   Patient is right-handed. She avoids caffeine. She has recently been using the treadmill.   Past Surgical History:  Procedure Laterality Date  . ACHILLES TENDON SURGERY Right 3/16  . ANTERIOR CERVICAL DECOMP/DISCECTOMY FUSION N/A 03/11/2016   Procedure: C5-6, C6-7 Anterior Cervical Discectomy and Fusion, Allograft, Plate;  Surgeon: Marybelle Killings, MD;   Location: [place] [facility];  Service: Orthopedics;  Laterality: N/A;  . CHOLECYSTECTOMY  2014  . COLONOSCOPY WITH PROPOFOL N/A 08/25/2017   Procedure: COLONOSCOPY WITH PROPOFOL;  Surgeon: Manya Silvas, MD;  Location[facility] ENDOSCOPY;  Service: Endoscopy;  Laterality: N/A;  . CYSTO WITH HYDRODISTENSION N/A 01/26/2014   Procedure: CYSTOSCOPY/HYDRODISTENSION;  Surgeon: Bernestine Amass, MD;  Location: [facility];  Service: Urology;  Laterality: N/A;  . CYSTO/  URETHRAL DILATION/  HYDRODISTENTION/   INSTILLATION THERAPY  07-16-2010//   12-30-2007//   10-27-2006  . EXERCISE TOLERENCE TEST  10-12-2010   NEGATIVE  ADEQUATE ETT/  NO ISCHEMIA OR EVIDENCE HIGH GRADE OBSTRUCTIVE CAD/  NO FURTHER TEST NEEDED  . [place] ENDOMETRIAL ABLATION  2014  . LAPAROSCOPIC OVARIAN CYST BX  2005   AND  URETEROSCOPIC LASER LITHO  STONE EXTRACTION  . SHOULDER OPEN ROTATOR CUFF REPAIR Right 2004  . TONSILLECTOMY    . TRANSTHORACIC ECHOCARDIOGRAM  06-07-2006   normal study/  ef 60-65%  . TUBAL LIGATION Bilateral 1995   Past Medical History:  Diagnosis Date  . Anxiety   . Anxiety disorder   . Arthritis   . Blepharitis   . Chronic low back pain   . Depression   . Dyslipidemia   . Fibromyalgia   . GERD (gastroesophageal reflux disease)   . Headache    hx migraines  . History of kidney stones   . History of panic attacks   . History of renal calculi   . IBS (irritable bowel syndrome)   . Interstitial cystitis   . Lupus ([place])    tested positive for antibodies for lupus. dr to do further tests  . Menorrhagia   . OSA on CPAP    not used cpap for several weeks  . Pneumonia    hx  . PONV (postoperative nausea and vomiting)   . RLS (restless legs syndrome)   . Rosacea   . Seasonal asthma   . SI (sacroiliac) joint dysfunction   . SUI (stress urinary incontinence, female)   . UTI (lower urinary tract infection)    hx  . 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 02/23/2013   BP 117/74    Wt 229 lb (103.9 kg)   BMI 40.57 kg/m   Opioid Risk Score:   Fall Risk Score:  `1  Depression screen PHQ 2/9  Depression screen [facility] 2/9 08/12/2018 07/20/2018 06/19/2018 04/20/2018 01/29/2018 01/28/2018 12/15/2017  Decreased Interest 0 1 1 0 0 1 2  Down, Depressed, Hopeless 0 1 1 0 0 1 2  PHQ - 2 Score 0 2 2 0 0 2 4  Altered sleeping - - - - - - -  Tired, decreased energy - - - - - - -  Change in appetite - - - - - - -  Feeling bad or failure about yourself  - - - - - - -  Trouble concentrating - - - - - - -  Moving slowly or fidgety/restless - - - - - - -  Suicidal thoughts - - - - - - -  PHQ-9 Score - - - - - - -  Difficult doing work/chores - - - - - - -  Some recent data might be hidden    Review of Systems  Constitutional: Negative.   HENT: Negative.   Eyes: Negative.   Respiratory: Positive for cough.   Gastrointestinal: Positive for constipation, diarrhea and nausea.  Endocrine:       High blood sugar  Genitourinary: Negative.   Musculoskeletal: Positive for arthralgias, back pain and neck pain.  Neurological: Positive for dizziness.  Hematological: Negative.   Psychiatric/Behavioral: Positive for dysphoric mood. The patient is nervous/anxious.   All other systems reviewed and are negative.      Objective:   Physical Exam Vitals signs and nursing note reviewed.  Neurological:     Mental Status: She is oriented to person, place, and time.           Assessment & Plan:  1. Lumbago/ Lumbar Spondylosis/ Left Lumbar Radiculitis: Continue to Monitor.12/23/2018. Refilled: HYDROcodone 10/325mg  one tablet every8hours as needed #90.  We will continue the opioid monitoring program, this consists of regular clinic visits, examinations, urine drug screen, pill counts as well as use of [place] Controlled Substance Reporting System. 2. Fibromyalgia. Continue Current exercise Regime.12/23/2018 3. Anxiety and depression:Psychiatry Following:[facility]at  [facility]. Continue Counseling at [facility]. On Xanax,12/23/2018 4. Migraines: On Maxalt. Neurology Following.12/23/2018 5. OSA : Continue exercise regime as tolerated and losing weight.12/23/2018 6. Obesity: Following Healthy Diet Regimen.12/23/2018 7. Status Post Cervical Spinal Fusion: C5C6- C-6- C-7 Anterior Cervical Discectomy Fusion and Allograft Plate:Dr. Lorin Mercy Following.12/23/2018 8. Cervicalgia/ Cervical Radiculitis/ S/P Cervical Spinal Fusion: Continueto MonitorDr. Yates Following.12/23/2018 9. Muscle Spasm: Continue Tizanidine as needed.12/23/2018 10.Hereditary and Idiopathic Peripheral Neuropathy Continue with Tens Unit.12/23/2018 11. BilateralGreater Trochanteric Bursitis: Continue to Alternate Ice and heat Therapy. Continue to Monitor. 12/23/2018.[name]  Location: Office  F/U in 15month

## 2018-12-28 ENCOUNTER — Telehealth: Payer: Self-pay | Admitting: Physical Medicine & Rehabilitation

## 2018-12-28 MED FILL — HYDROCODON-APAP 10-325: 10-325 | 30 days supply | Qty: 90 | Fill #0

## 2018-12-28 NOTE — Telephone Encounter (Signed)
This provider return Karina Greer call, she states when she called [facility] about her hydrocodone, she was informed she woulldn;t be able to pick up her medication. Karina Greer has been receiving her  medication (  Hydrocodone )  from [place]. This provider placed a call to [facility], the pharmacist Karina Greer states it is related to her insurance plan [employer].

## 2018-12-28 NOTE — Telephone Encounter (Signed)
Patient needs a call back in reference to her refill with [facility], they are telling her that the medication is not contracted.  Not sure which medicine it is, she didn't say.

## 2018-12-28 NOTE — Telephone Encounter (Signed)
Manuela Schwartz the pharmacist, states they have Ms. Baillie hydrocodone ready, call placed to Ms. Marsee regarding the above, she verbalizes understanding. Ms. Lottman was instructed to speak to Manuela Schwartz the pharmacist when she goes to [facility], she verbalizes understanding.

## 2018-12-30 ENCOUNTER — Ambulatory Visit (INDEPENDENT_AMBULATORY_CARE_PROVIDER_SITE_OTHER): Payer: Medicare Other

## 2018-12-30 ENCOUNTER — Other Ambulatory Visit: Payer: Self-pay

## 2018-12-30 DIAGNOSIS — J301 Allergic rhinitis due to pollen: Secondary | ICD-10-CM | POA: Diagnosis not present

## 2019-01-03 ENCOUNTER — Other Ambulatory Visit: Payer: Self-pay | Admitting: Internal Medicine

## 2019-01-12 DIAGNOSIS — F332 Major depressive disorder, recurrent severe without psychotic features: Secondary | ICD-10-CM | POA: Diagnosis not present

## 2019-01-12 DIAGNOSIS — F411 Generalized anxiety disorder: Secondary | ICD-10-CM | POA: Diagnosis not present

## 2019-01-13 ENCOUNTER — Ambulatory Visit: Payer: Medicare Other

## 2019-01-13 DIAGNOSIS — F332 Major depressive disorder, recurrent severe without psychotic features: Secondary | ICD-10-CM | POA: Diagnosis not present

## 2019-01-13 DIAGNOSIS — F3181 Bipolar II disorder: Secondary | ICD-10-CM | POA: Diagnosis not present

## 2019-01-13 DIAGNOSIS — F411 Generalized anxiety disorder: Secondary | ICD-10-CM | POA: Diagnosis not present

## 2019-01-19 DIAGNOSIS — F332 Major depressive disorder, recurrent severe without psychotic features: Secondary | ICD-10-CM | POA: Diagnosis not present

## 2019-01-19 DIAGNOSIS — F411 Generalized anxiety disorder: Secondary | ICD-10-CM | POA: Diagnosis not present

## 2019-01-20 ENCOUNTER — Ambulatory Visit: Payer: Medicare Other

## 2019-01-22 ENCOUNTER — Other Ambulatory Visit: Payer: Self-pay

## 2019-01-22 ENCOUNTER — Encounter: Payer: Medicare Other | Attending: Physical Medicine & Rehabilitation | Admitting: Registered Nurse

## 2019-01-22 ENCOUNTER — Encounter: Payer: Self-pay | Admitting: Registered Nurse

## 2019-01-22 VITALS — Ht 63.0 in | Wt 224.0 lb

## 2019-01-22 DIAGNOSIS — Z79899 Other long term (current) drug therapy: Secondary | ICD-10-CM

## 2019-01-22 DIAGNOSIS — Z981 Arthrodesis status: Secondary | ICD-10-CM | POA: Diagnosis not present

## 2019-01-22 DIAGNOSIS — G894 Chronic pain syndrome: Secondary | ICD-10-CM | POA: Diagnosis not present

## 2019-01-22 DIAGNOSIS — F3289 Other specified depressive episodes: Secondary | ICD-10-CM | POA: Diagnosis not present

## 2019-01-22 DIAGNOSIS — R200 Anesthesia of skin: Secondary | ICD-10-CM | POA: Insufficient documentation

## 2019-01-22 DIAGNOSIS — M62838 Other muscle spasm: Secondary | ICD-10-CM | POA: Diagnosis not present

## 2019-01-22 DIAGNOSIS — M542 Cervicalgia: Secondary | ICD-10-CM | POA: Diagnosis not present

## 2019-01-22 DIAGNOSIS — M797 Fibromyalgia: Secondary | ICD-10-CM | POA: Diagnosis not present

## 2019-01-22 DIAGNOSIS — F419 Anxiety disorder, unspecified: Secondary | ICD-10-CM | POA: Diagnosis not present

## 2019-01-22 DIAGNOSIS — M7061 Trochanteric bursitis, right hip: Secondary | ICD-10-CM

## 2019-01-22 DIAGNOSIS — M47816 Spondylosis without myelopathy or radiculopathy, lumbar region: Secondary | ICD-10-CM

## 2019-01-22 DIAGNOSIS — Z5181 Encounter for therapeutic drug level monitoring: Secondary | ICD-10-CM

## 2019-01-22 DIAGNOSIS — R202 Paresthesia of skin: Secondary | ICD-10-CM | POA: Insufficient documentation

## 2019-01-22 MED ORDER — TIZANIDINE HCL 2 MG PO TABS: 2.0000 mg | ORAL_TABLET | Freq: Three times a day (TID) | ORAL | 2 refills | Status: DC | PRN

## 2019-01-22 MED ORDER — HYDROCODONE-ACETAMINOPHEN 10-325 MG PO TABS: ORAL_TABLET | ORAL | 0 refills | Status: DC

## 2019-01-22 NOTE — Progress Notes (Signed)
Subjective:    Patient ID: Karina Greer, female    DOB: 1966-05-04, 53 y.o.   MRN: [id number]  HPI" Karina Greer is a 53 y.o. female her appointment was changed, due to national recommendations of social distancing due to [place] 19, an audio/video telehealth visit is felt to be most appropriate for this patient at this time.  See Chart message from today for the patient's consent to telehealth from [place].     She states her pain is located in her neck, mid- lower back and right hip pain.  She rates her pain 8. Her current exercise regime is walking and performing stretching exercises.  Karina Greer Morphine equivalent is 30.00 MME. She is also prescribed Alprazolam  by Lorelle Gibbs NP. We have discussed the black box warning of using opioids and benzodiazepines. I highlighted the dangers of using these drugs together and discussed the adverse events including respiratory suppression, overdose, cognitive impairment and importance of compliance with current regimen. We will continue to monitor and adjust as indicated.  She  is being closely monitored and under the care of her psychiatrist at [facility].   Last Oral Swab was Performed on 05/15/2018, it was consistent.   Karina Greer CMA asked the Health and History Questions. This Provider and Karina Greer verified we were speaking with the correct person using two identifiers.   Pain Inventory Average Pain 7 Pain Right Now 8 My pain is constant, stabbing and aching  In the last 24 hours, has pain interfered with the following? General activity 8 Relation with others 8 Enjoyment of life 8 What TIME of day is your pain at its worst? all Sleep (in general) Fair  Pain is worse with: walking, bending, sitting, inactivity, standing and some activites Pain improves with: rest, heat/ice, therapy/exercise, pacing activities, medication and TENS Relief from Meds: 8  Mobility walk without  assistance how many minutes can you walk? 15-20 ability to climb steps?  yes do you drive?  yes  Function disabled: date disabled .  Neuro/Psych bladder control problems dizziness anxiety  Prior Studies Any changes since last visit?  no  Physicians involved in your care Any changes since last visit?  no   Family History  Problem Relation Age of Onset  . Hypertension Mother   . Diabetes Father   . Brain cancer Father   . Fibromyalgia Sister   . Suicidality Sister   . Hypertension Other        Cancer, Cerebrovascular disease run on mother side of family   Social History   Socioeconomic History  . Marital status: Divorced    Spouse name: Not on file  . Number of children: 3  . Years of education: HS  . Highest education level: 12th grade  Occupational History  . Occupation: Merchandiser, retail: UNEMPLOYED    Comment: Disability  Social Needs  . Financial resource strain: Not on file  . Food insecurity:    Worry: Not on file    Inability: Not on file  . Transportation needs:    Medical: Not on file    Non-medical: Not on file  Tobacco Use  . Smoking status: Never Smoker  . Smokeless tobacco: Never Used  Substance and Sexual Activity  . Alcohol use: No  . Drug use: No  . Sexual activity: Not on file  Lifestyle  . Physical activity:    Days per week: Not on file  Minutes per session: Not on file  . Stress: Not on file  Relationships  . Social connections:    Talks on phone: Not on file    Gets together: Not on file    Attends religious service: Not on file    Active member of club or organization: Not on file    Attends meetings of clubs or organizations: Not on file    Relationship status: Not on file  Other Topics Concern  . Not on file  Social History Narrative   Patient is right-handed. She avoids caffeine. She has recently been using the treadmill.   Past Surgical History:  Procedure Laterality Date  . ACHILLES TENDON SURGERY Right 3/16   . ANTERIOR CERVICAL DECOMP/DISCECTOMY FUSION N/A 03/11/2016   Procedure: C5-6, C6-7 Anterior Cervical Discectomy and Fusion, Allograft, Plate;  Surgeon: Marybelle Killings, MD;  Location: [place];  Service: Orthopedics;  Laterality: N/A;  . CHOLECYSTECTOMY  2014  . COLONOSCOPY WITH PROPOFOL N/A 08/25/2017   Procedure: COLONOSCOPY WITH PROPOFOL;  Surgeon: Manya Silvas, MD;  [facility] ENDOSCOPY;  Service: Endoscopy;  Laterality: N/A;  . CYSTO WITH HYDRODISTENSION N/A 01/26/2014   Procedure: CYSTOSCOPY/HYDRODISTENSION;  Surgeon: Bernestine Amass, MD;  Location: [facility];  Service: Urology;  Laterality: N/A;  . CYSTO/  URETHRAL DILATION/  HYDRODISTENTION/   INSTILLATION THERAPY  07-16-2010//   12-30-2007//   10-27-2006  . EXERCISE TOLERENCE TEST  10-12-2010   NEGATIVE  ADEQUATE ETT/  NO ISCHEMIA OR EVIDENCE HIGH GRADE OBSTRUCTIVE CAD/  NO FURTHER TEST NEEDED  . [place] ENDOMETRIAL ABLATION  2014  . LAPAROSCOPIC OVARIAN CYST BX  2005   AND  URETEROSCOPIC LASER LITHO  STONE EXTRACTION  . SHOULDER OPEN ROTATOR CUFF REPAIR Right 2004  . TONSILLECTOMY    . TRANSTHORACIC ECHOCARDIOGRAM  06-07-2006   normal study/  ef 60-65%  . TUBAL LIGATION Bilateral 1995   Past Medical History:  Diagnosis Date  . Anxiety   . Anxiety disorder   . Arthritis   . Blepharitis   . Chronic low back pain   . Depression   . Dyslipidemia   . Fibromyalgia   . GERD (gastroesophageal reflux disease)   . Headache    hx migraines  . History of kidney stones   . History of panic attacks   . History of renal calculi   . IBS (irritable bowel syndrome)   . Interstitial cystitis   . Lupus ([place])    tested positive for antibodies for lupus. dr to do further tests  . Menorrhagia   . OSA on CPAP    not used cpap for several weeks  . Pneumonia    hx  . PONV (postoperative nausea and vomiting)   . RLS (restless legs syndrome)   . Rosacea   . Seasonal asthma   . SI (sacroiliac) joint  dysfunction   . SUI (stress urinary incontinence, female)   . UTI (lower urinary tract infection)    hx  . 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 02/23/2013   Ht 5\' 3"  (1.6 m)   Wt 229 lb (103.9 kg)   BMI 40.57 kg/m   Opioid Risk Score:   Fall Risk Score:  `1  Depression screen PHQ 2/9  Depression screen [facility] 2/9 08/12/2018 07/20/2018 06/19/2018 04/20/2018 01/29/2018 01/28/2018 12/15/2017  Decreased Interest 0 1 1 0 0 1 2  Down, Depressed, Hopeless 0 1 1 0 0 1 2  PHQ - 2 Score 0 2  2 0 0 2 4  Altered sleeping - - - - - - -  Tired, decreased energy - - - - - - -  Change in appetite - - - - - - -  Feeling bad or failure about yourself  - - - - - - -  Trouble concentrating - - - - - - -  Moving slowly or fidgety/restless - - - - - - -  Suicidal thoughts - - - - - - -  PHQ-9 Score - - - - - - -  Difficult doing work/chores - - - - - - -  Some recent data might be hidden    Review of Systems  Constitutional: Negative.   HENT: Positive for congestion, ear pain, sneezing and sore throat.   Eyes: Negative.   Respiratory: Negative.   Cardiovascular: Negative.   Gastrointestinal: Negative.   Endocrine: Negative.   Genitourinary: Positive for frequency and urgency.  Musculoskeletal: Positive for arthralgias and back pain.  Skin: Negative.   Allergic/Immunologic: Negative.   Neurological: Positive for headaches.  Hematological: Negative.   Psychiatric/Behavioral: The patient is nervous/anxious.   All other systems reviewed and are negative.      Objective:   Physical Exam Vitals signs and nursing note reviewed.  Musculoskeletal:     Comments: No Physical Exam Perform: Virtual Visit  Neurological:     Mental Status: She is oriented to person, place, and time.           Assessment & Plan:  1. Lumbago/ Lumbar Spondylosis/ Left Lumbar Radiculitis: Continue to Monitor.01/22/2019. Refilled: HYDROcodone 10/325mg  one tablet every8hours as needed #90.  We will continue the  opioid monitoring program, this consists of regular clinic visits, examinations, urine drug screen, pill counts as well as use of [place] Controlled Substance Reporting System. 2. Fibromyalgia. Continue Current exercise Regime.01/22/2019 3. Anxiety and depression:Psychiatry Following:[facility]. Continue Counseling at [facility]. On Xanax,01/22/2019 4. Migraines: On Maxalt. Neurology Following.01/22/2019 5. OSA : Continue exercise regime as tolerated and losing weight.01/22/2019 6. Obesity: Following Healthy Diet Regimen.01/22/2019 7. Status Post Cervical Spinal Fusion: C5C6- C-6- C-7 Anterior Cervical Discectomy Fusion and Allograft Plate:Dr. Lorin Mercy Following.01/22/2019 8. Cervicalgia/ Cervical Radiculitis/ S/P Cervical Spinal Fusion: Continueto MonitorDr. Yates Following.01/22/2019 9. Muscle Spasm: Continue Tizanidine as needed.01/22/2019 10.Hereditary and Idiopathic Peripheral Neuropathy Continue with Tens Unit.01/22/2019 11.Right Greater Trochanteric Bursitis:Continue to Alternate Ice and heat Therapy. Continue to Monitor. 01/22/2019..  Telephone Call  Location of patient: In her Home Location of provider: Office Established patient Time spent on call:

## 2019-01-25 MED FILL — HYDROCODON-APAP 10-325: 10-325 | 30 days supply | Qty: 90 | Fill #0

## 2019-01-27 ENCOUNTER — Other Ambulatory Visit: Payer: Self-pay

## 2019-01-27 ENCOUNTER — Ambulatory Visit (INDEPENDENT_AMBULATORY_CARE_PROVIDER_SITE_OTHER): Payer: Medicare Other

## 2019-01-27 DIAGNOSIS — F332 Major depressive disorder, recurrent severe without psychotic features: Secondary | ICD-10-CM | POA: Diagnosis not present

## 2019-01-27 DIAGNOSIS — F411 Generalized anxiety disorder: Secondary | ICD-10-CM | POA: Diagnosis not present

## 2019-01-27 DIAGNOSIS — J301 Allergic rhinitis due to pollen: Secondary | ICD-10-CM

## 2019-02-01 DIAGNOSIS — R102 Pelvic and perineal pain: Secondary | ICD-10-CM | POA: Diagnosis not present

## 2019-02-03 DIAGNOSIS — R300 Dysuria: Secondary | ICD-10-CM | POA: Diagnosis not present

## 2019-02-03 DIAGNOSIS — N3010 Interstitial cystitis (chronic) without hematuria: Secondary | ICD-10-CM | POA: Diagnosis not present

## 2019-02-03 DIAGNOSIS — J301 Allergic rhinitis due to pollen: Secondary | ICD-10-CM | POA: Diagnosis not present

## 2019-02-03 DIAGNOSIS — R7303 Prediabetes: Secondary | ICD-10-CM | POA: Diagnosis not present

## 2019-02-03 DIAGNOSIS — J019 Acute sinusitis, unspecified: Secondary | ICD-10-CM | POA: Diagnosis not present

## 2019-02-04 DIAGNOSIS — F332 Major depressive disorder, recurrent severe without psychotic features: Secondary | ICD-10-CM | POA: Diagnosis not present

## 2019-02-04 DIAGNOSIS — F411 Generalized anxiety disorder: Secondary | ICD-10-CM | POA: Diagnosis not present

## 2019-02-09 DIAGNOSIS — R102 Pelvic and perineal pain: Secondary | ICD-10-CM | POA: Diagnosis not present

## 2019-02-10 ENCOUNTER — Ambulatory Visit: Payer: Medicare Other

## 2019-02-18 DIAGNOSIS — F411 Generalized anxiety disorder: Secondary | ICD-10-CM | POA: Diagnosis not present

## 2019-02-18 DIAGNOSIS — F332 Major depressive disorder, recurrent severe without psychotic features: Secondary | ICD-10-CM | POA: Diagnosis not present

## 2019-02-22 ENCOUNTER — Other Ambulatory Visit: Payer: Self-pay

## 2019-02-22 ENCOUNTER — Encounter: Payer: Self-pay | Admitting: Registered Nurse

## 2019-02-22 ENCOUNTER — Encounter: Payer: Medicare Other | Attending: Physical Medicine & Rehabilitation | Admitting: Registered Nurse

## 2019-02-22 VITALS — BP 145/87 | HR 74 | Temp 98.2°F | Ht 63.0 in | Wt 231.0 lb

## 2019-02-22 DIAGNOSIS — Z981 Arthrodesis status: Secondary | ICD-10-CM

## 2019-02-22 DIAGNOSIS — F3289 Other specified depressive episodes: Secondary | ICD-10-CM | POA: Diagnosis not present

## 2019-02-22 DIAGNOSIS — M542 Cervicalgia: Secondary | ICD-10-CM

## 2019-02-22 DIAGNOSIS — Z5181 Encounter for therapeutic drug level monitoring: Secondary | ICD-10-CM | POA: Diagnosis not present

## 2019-02-22 DIAGNOSIS — Z79899 Other long term (current) drug therapy: Secondary | ICD-10-CM | POA: Diagnosis not present

## 2019-02-22 DIAGNOSIS — R202 Paresthesia of skin: Secondary | ICD-10-CM | POA: Insufficient documentation

## 2019-02-22 DIAGNOSIS — M62838 Other muscle spasm: Secondary | ICD-10-CM

## 2019-02-22 DIAGNOSIS — R200 Anesthesia of skin: Secondary | ICD-10-CM | POA: Diagnosis not present

## 2019-02-22 DIAGNOSIS — M797 Fibromyalgia: Secondary | ICD-10-CM | POA: Diagnosis not present

## 2019-02-22 DIAGNOSIS — M47816 Spondylosis without myelopathy or radiculopathy, lumbar region: Secondary | ICD-10-CM | POA: Diagnosis not present

## 2019-02-22 DIAGNOSIS — M7061 Trochanteric bursitis, right hip: Secondary | ICD-10-CM

## 2019-02-22 DIAGNOSIS — F419 Anxiety disorder, unspecified: Secondary | ICD-10-CM | POA: Diagnosis not present

## 2019-02-22 DIAGNOSIS — G894 Chronic pain syndrome: Secondary | ICD-10-CM

## 2019-02-22 MED ORDER — HYDROCODONE-ACETAMINOPHEN 10-325 MG PO TABS: ORAL_TABLET | ORAL | 0 refills | Status: DC

## 2019-02-22 NOTE — Progress Notes (Signed)
Subjective:    Patient ID: Karina Greer, female    DOB: Feb 17, 1966, 53 y.o.   MRN: [id number]  HPI: Karina Greer is a 53 y.o. female who returns for follow up appointment for chronic pain and medication refill. She states her  pain is located in her neck, mid- lower back and right hip pain. Also reports generalized pain all over. She rates her pain 8. Her current exercise regime is walking on treadmill daily for 15 minutes. Encouraged to perform stretching exercises, she verbalizes understanding.   Karina Greer Morphine equivalent is 30.00  MME. She  is also prescribed Alprazolam  by Lorelle Gibbs NP.We have discussed the black box warning of using opioids and benzodiazepines. I highlighted the dangers of using these drugs together and discussed the adverse events including respiratory suppression, overdose, cognitive impairment and importance of compliance with current regimen. We will continue to monitor and adjust as indicated.  she is being closely monitored and under the care of her psychiatrist at [facility].   Last Oral Swab was Performed on 05/15/2018, it was consistent. UDS ordered today.   Pain Inventory Average Pain 9 Pain Right Now 8 My pain is intermittent, constant, stabbing and tingling  In the last 24 hours, has pain interfered with the following? General activity 8 Relation with others 8 Enjoyment of life 8 What TIME of day is your pain at its worst? all Sleep (in general) Fair  Pain is worse with: walking, bending, sitting, inactivity, standing and some activites Pain improves with: rest, heat/ice, therapy/exercise, pacing activities, medication and TENS Relief from Meds: 7  Mobility walk without assistance ability to climb steps?  yes do you drive?  yes  Function I need assistance with the following:  meal prep, household duties and shopping  Neuro/Psych anxiety  Prior Studies Any changes since last visit?  no  Physicians involved in your care Any  changes since last visit?  no   Family History  Problem Relation Age of Onset  . Hypertension Mother   . Diabetes Father   . Brain cancer Father   . Fibromyalgia Sister   . Suicidality Sister   . Hypertension Other        Cancer, Cerebrovascular disease run on mother side of family   Social History   Socioeconomic History  . Marital status: Divorced    Spouse name: Not on file  . Number of children: 3  . Years of education: HS  . Highest education level: 12th grade  Occupational History  . Occupation: Merchandiser, retail: UNEMPLOYED    Comment: Disability  Social Needs  . Financial resource strain: Not on file  . Food insecurity:    Worry: Not on file    Inability: Not on file  . Transportation needs:    Medical: Not on file    Non-medical: Not on file  Tobacco Use  . Smoking status: Never Smoker  . Smokeless tobacco: Never Used  Substance and Sexual Activity  . Alcohol use: No  . Drug use: No  . Sexual activity: Not on file  Lifestyle  . Physical activity:    Days per week: Not on file    Minutes per session: Not on file  . Stress: Not on file  Relationships  . Social connections:    Talks on phone: Not on file    Gets together: Not on file    Attends religious service: Not on file    Active member of  club or organization: Not on file    Attends meetings of clubs or organizations: Not on file    Relationship status: Not on file  Other Topics Concern  . Not on file  Social History Narrative   Patient is right-handed. She avoids caffeine. She has recently been using the treadmill.   Past Surgical History:  Procedure Laterality Date  . ACHILLES TENDON SURGERY Right 3/16  . ANTERIOR CERVICAL DECOMP/DISCECTOMY FUSION N/A 03/11/2016   Procedure: C5-6, C6-7 Anterior Cervical Discectomy and Fusion, Allograft, Plate;  Surgeon: Marybelle Killings, MD;  Location: [place];  Service: Orthopedics;  Laterality: N/A;  . CHOLECYSTECTOMY  2014  . COLONOSCOPY WITH PROPOFOL N/A  08/25/2017   Procedure: COLONOSCOPY WITH PROPOFOL;  Surgeon: Manya Silvas, MD;  Location[facility] ENDOSCOPY;  Service: Endoscopy;  Laterality: N/A;  . CYSTO WITH HYDRODISTENSION N/A 01/26/2014   Procedure: CYSTOSCOPY/HYDRODISTENSION;  Surgeon: Bernestine Amass, MD;  Location: [facility];  Service: Urology;  Laterality: N/A;  . CYSTO/  URETHRAL DILATION/  HYDRODISTENTION/   INSTILLATION THERAPY  07-16-2010//   12-30-2007//   10-27-2006  . EXERCISE TOLERENCE TEST  10-12-2010   NEGATIVE  ADEQUATE ETT/  NO ISCHEMIA OR EVIDENCE HIGH GRADE OBSTRUCTIVE CAD/  NO FURTHER TEST NEEDED  . [place] ENDOMETRIAL ABLATION  2014  . LAPAROSCOPIC OVARIAN CYST BX  2005   AND  URETEROSCOPIC LASER LITHO  STONE EXTRACTION  . SHOULDER OPEN ROTATOR CUFF REPAIR Right 2004  . TONSILLECTOMY    . TRANSTHORACIC ECHOCARDIOGRAM  06-07-2006   normal study/  ef 60-65%  . TUBAL LIGATION Bilateral 1995   Past Medical History:  Diagnosis Date  . Anxiety   . Anxiety disorder   . Arthritis   . Blepharitis   . Chronic low back pain   . Depression   . Dyslipidemia   . Fibromyalgia   . GERD (gastroesophageal reflux disease)   . Headache    hx migraines  . History of kidney stones   . History of panic attacks   . History of renal calculi   . IBS (irritable bowel syndrome)   . Interstitial cystitis   . Lupus ([place])    tested positive for antibodies for lupus. dr to do further tests  . Menorrhagia   . OSA on CPAP    not used cpap for several weeks  . Pneumonia    hx  . PONV (postoperative nausea and vomiting)   . RLS (restless legs syndrome)   . Rosacea   . Seasonal asthma   . SI (sacroiliac) joint dysfunction   . SUI (stress urinary incontinence, female)   . UTI (lower urinary tract infection)    hx  . 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 02/23/2013   BP (!) 145/87   Pulse 74   Temp 98.2 F (36.8 C)   Ht 5\' 3"  (1.6 m)   Wt 231 lb (104.8 kg)   SpO2 98%   BMI 40.92 kg/m    Opioid Risk Score:   Fall Risk Score:  `1  Depression screen PHQ 2/9  Depression screen [facility] 2/9 08/12/2018 07/20/2018 06/19/2018 04/20/2018 01/29/2018 01/28/2018 12/15/2017  Decreased Interest 0 1 1 0 0 1 2  Down, Depressed, Hopeless 0 1 1 0 0 1 2  PHQ - 2 Score 0 2 2 0 0 2 4  Altered sleeping - - - - - - -  Tired, decreased energy - - - - - - -  Change in appetite - - - - - - -  Feeling bad or failure about yourself  - - - - - - -  Trouble concentrating - - - - - - -  Moving slowly or fidgety/restless - - - - - - -  Suicidal thoughts - - - - - - -  PHQ-9 Score - - - - - - -  Difficult doing work/chores - - - - - - -  Some recent data might be hidden     Review of Systems  Constitutional: Negative.   HENT: Negative.   Eyes: Negative.   Respiratory: Negative.   Cardiovascular: Negative.   Gastrointestinal: Negative.   Endocrine: Negative.   Genitourinary: Negative.   Musculoskeletal: Positive for arthralgias, back pain and myalgias.  Skin: Negative.   Allergic/Immunologic: Negative.   Neurological: Negative.   Hematological: Negative.   Psychiatric/Behavioral: Negative.   All other systems reviewed and are negative.      Objective:   Physical Exam Vitals signs and nursing note reviewed.  Constitutional:      Appearance: Normal appearance.  Neck:     Musculoskeletal: Normal range of motion and neck supple.  Cardiovascular:     Rate and Rhythm: Normal rate and regular rhythm.     Pulses: Normal pulses.     Heart sounds: Normal heart sounds.  Pulmonary:     Effort: Pulmonary effort is normal.     Breath sounds: Normal breath sounds.  Musculoskeletal:     Comments: Normal Muscle Bulk and Muscle Testing Reveals:  Upper Extremities: Full ROM and Muscle Strength 5/5 Thoracic Paraspinal Tenderness: T-7-T-9 Lumbar Hypersensitivity Lower Extremities: Full ROM and Muscle Strength 5/5  Right Lower Extremity Flexion Produces Pain into her Lumbar Arises from Table with ease  Narrow Based  Gait   Skin:    General: Skin is warm and dry.  Neurological:     Mental Status: She is alert and oriented to person, place, and time.  Psychiatric:        Mood and Affect: Mood normal.        Behavior: Behavior normal.           Assessment & Plan:  1. Lumbago/ Lumbar Spondylosis/LeftLumbar Radiculitis: Continue to Monitor.02/22/2019. Refilled: HYDROcodone 10/325mg  one tablet every8hours as needed #90.  We will continue the opioid monitoring program, this consists of regular clinic visits, examinations, urine drug screen, pill counts as well as use of [place] Controlled Substance Reporting System. 2. Fibromyalgia. Continue Current exercise Regime.02/22/2019 3. Anxiety and depression:[facility]iatry [facility]. Continue Counseling at [facility]. On Xanax,02/22/2019 4. Migraines: On Maxalt. Neurology Following.02/22/2019 5. OSA : Continue exercise regime as tolerated and losing weight.02/22/2019 6. Obesity: Following Healthy Diet Regimen.02/22/2019 7. Status Post Cervical Spinal Fusion: C5C6- C-6- C-7 Anterior Cervical Discectomy Fusion and Allograft Plate:Dr. Lorin Mercy Following.02/22/2019 8. Cervicalgia/ Cervical Radiculitis/ S/P Cervical Spinal Fusion: Continueto MonitorDr. Yates Following.02/22/2019 9. Muscle Spasm: Continue Tizanidine as needed.02/22/2019 10.Hereditary and Idiopathic Peripheral Neuropathy Continue with Tens Unit.02/22/2019 11.Right Greater Trochanteric Bursitis:Continue to Alternate Ice andheat Therapy. Continue to Monitor. 02/22/2019.  F/U in 1 month

## 2019-02-23 MED FILL — HYDROCODON-APAP 10-325: 10-325 | 30 days supply | Qty: 90 | Fill #0

## 2019-02-24 ENCOUNTER — Other Ambulatory Visit: Payer: Self-pay

## 2019-02-24 ENCOUNTER — Ambulatory Visit (INDEPENDENT_AMBULATORY_CARE_PROVIDER_SITE_OTHER): Payer: Medicare Other

## 2019-02-24 DIAGNOSIS — J301 Allergic rhinitis due to pollen: Secondary | ICD-10-CM | POA: Diagnosis not present

## 2019-02-25 ENCOUNTER — Other Ambulatory Visit: Payer: Self-pay | Admitting: Internal Medicine

## 2019-02-28 LAB — TOXASSURE SELECT,+ANTIDEPR,UR

## 2019-03-02 ENCOUNTER — Telehealth: Payer: Self-pay | Admitting: *Deleted

## 2019-03-02 DIAGNOSIS — M545 Low back pain: Secondary | ICD-10-CM | POA: Diagnosis not present

## 2019-03-02 DIAGNOSIS — M25551 Pain in right hip: Secondary | ICD-10-CM | POA: Diagnosis not present

## 2019-03-02 DIAGNOSIS — F332 Major depressive disorder, recurrent severe without psychotic features: Secondary | ICD-10-CM | POA: Diagnosis not present

## 2019-03-02 DIAGNOSIS — M810 Age-related osteoporosis without current pathological fracture: Secondary | ICD-10-CM | POA: Diagnosis not present

## 2019-03-02 DIAGNOSIS — M160 Bilateral primary osteoarthritis of hip: Secondary | ICD-10-CM | POA: Diagnosis not present

## 2019-03-02 DIAGNOSIS — M5136 Other intervertebral disc degeneration, lumbar region: Secondary | ICD-10-CM | POA: Diagnosis not present

## 2019-03-02 DIAGNOSIS — M25552 Pain in left hip: Secondary | ICD-10-CM | POA: Diagnosis not present

## 2019-03-02 DIAGNOSIS — F411 Generalized anxiety disorder: Secondary | ICD-10-CM | POA: Diagnosis not present

## 2019-03-02 NOTE — Telephone Encounter (Signed)
Urine drug screen for this encounter is consistent for prescribed medication 

## 2019-03-09 ENCOUNTER — Other Ambulatory Visit: Payer: Self-pay | Admitting: Registered Nurse

## 2019-03-10 ENCOUNTER — Other Ambulatory Visit: Payer: Self-pay

## 2019-03-10 ENCOUNTER — Ambulatory Visit (INDEPENDENT_AMBULATORY_CARE_PROVIDER_SITE_OTHER): Payer: Medicare Other

## 2019-03-10 DIAGNOSIS — J301 Allergic rhinitis due to pollen: Secondary | ICD-10-CM

## 2019-03-17 ENCOUNTER — Ambulatory Visit: Payer: Medicare Other

## 2019-03-19 DIAGNOSIS — J019 Acute sinusitis, unspecified: Secondary | ICD-10-CM | POA: Diagnosis not present

## 2019-03-19 DIAGNOSIS — Z6841 Body Mass Index (BMI) 40.0 and over, adult: Secondary | ICD-10-CM | POA: Diagnosis not present

## 2019-03-19 DIAGNOSIS — H9201 Otalgia, right ear: Secondary | ICD-10-CM | POA: Diagnosis not present

## 2019-03-24 ENCOUNTER — Other Ambulatory Visit: Payer: Self-pay

## 2019-03-24 ENCOUNTER — Other Ambulatory Visit: Payer: Self-pay | Admitting: *Deleted

## 2019-03-24 ENCOUNTER — Encounter: Payer: Self-pay | Admitting: Physical Medicine & Rehabilitation

## 2019-03-24 ENCOUNTER — Encounter: Payer: Medicare Other | Attending: Physical Medicine & Rehabilitation | Admitting: Physical Medicine & Rehabilitation

## 2019-03-24 ENCOUNTER — Ambulatory Visit (INDEPENDENT_AMBULATORY_CARE_PROVIDER_SITE_OTHER): Payer: Medicare Other

## 2019-03-24 VITALS — BP 143/83 | HR 77 | Temp 97.7°F | Ht 63.0 in | Wt 231.0 lb

## 2019-03-24 DIAGNOSIS — R200 Anesthesia of skin: Secondary | ICD-10-CM | POA: Insufficient documentation

## 2019-03-24 DIAGNOSIS — J301 Allergic rhinitis due to pollen: Secondary | ICD-10-CM

## 2019-03-24 DIAGNOSIS — R202 Paresthesia of skin: Secondary | ICD-10-CM | POA: Insufficient documentation

## 2019-03-24 DIAGNOSIS — F419 Anxiety disorder, unspecified: Secondary | ICD-10-CM

## 2019-03-24 DIAGNOSIS — M47817 Spondylosis without myelopathy or radiculopathy, lumbosacral region: Secondary | ICD-10-CM

## 2019-03-24 DIAGNOSIS — M797 Fibromyalgia: Secondary | ICD-10-CM | POA: Insufficient documentation

## 2019-03-24 MED ORDER — PREDNISONE 10 MG PO TABS: ORAL_TABLET | ORAL | 0 refills | Status: DC

## 2019-03-24 MED ORDER — PREDNISONE 20 MG PO TABS: 10.0000 mg | ORAL_TABLET | Freq: Every day | ORAL | 0 refills | Status: DC

## 2019-03-24 MED ORDER — HYDROCODONE-ACETAMINOPHEN 10-325 MG PO TABS: ORAL_TABLET | ORAL | 0 refills | Status: DC

## 2019-03-24 MED FILL — predniSONE 10 MG TABS: 10 | 9 days supply | Qty: 21 | Fill #0

## 2019-03-24 MED FILL — HYDROCODON-APAP 10-325: 10-325 | 30 days supply | Qty: 90 | Fill #0

## 2019-03-24 NOTE — Progress Notes (Signed)
Subjective:    Patient ID: Karina Greer, female    DOB: 18-Jul-1966, 53 y.o.   MRN: [id number]  HPI   Yuriana is here in follow up of her chronic pain. She had a fall just about a month ago when her right knee gave out. She fell on her right knee and arched her back backwards as she was trying to right herself. She has had some ongoing pain in her low back pain radiating over her left hip into the left groin.   Reviewed her hip and lumbar xrays from her pcp. There is mild lumbar spondylosis and djd of hips. Pain is better but persistent with prolonged sitting and standing.   She remains on hydrocodone for pain control. She also uses some ice and heat.   Her costochondritis has flared recently.   Pain Inventory Average Pain 10 Pain Right Now 8 My pain is sharp, stabbing and tingling  In the last 24 hours, has pain interfered with the following? General activity 9 Relation with others 9 Enjoyment of life 9 What TIME of day is your pain at its worst? all Sleep (in general) Fair  Pain is worse with: walking, bending, sitting, inactivity, standing, unsure and some activites Pain improves with: rest, heat/ice, therapy/exercise, pacing activities, medication and TENS Relief from Meds: 7  Mobility walk without assistance ability to climb steps?  yes do you drive?  yes  Function disabled: date disabled .  Neuro/Psych trouble walking dizziness anxiety  Prior Studies Any changes since last visit?  no  Physicians involved in your care Any changes since last visit?  no   Family History  Problem Relation Age of Onset  . Hypertension Mother   . Diabetes Father   . Brain cancer Father   . Fibromyalgia Sister   . Suicidality Sister   . Hypertension Other        Cancer, Cerebrovascular disease run on mother side of family   Social History   Socioeconomic History  . Marital status: Divorced    Spouse name: Not on file  . Number of children: 3  . Years of education: HS   . Highest education level: 12th grade  Occupational History  . Occupation: Merchandiser, retail: UNEMPLOYED    Comment: Disability  Social Needs  . Financial resource strain: Not on file  . Food insecurity    Worry: Not on file    Inability: Not on file  . Transportation needs    Medical: Not on file    Non-medical: Not on file  Tobacco Use  . Smoking status: Never Smoker  . Smokeless tobacco: Never Used  Substance and Sexual Activity  . Alcohol use: No  . Drug use: No  . Sexual activity: Not on file  Lifestyle  . Physical activity    Days per week: Not on file    Minutes per session: Not on file  . Stress: Not on file  Relationships  . Social Herbalist on phone: Not on file    Gets together: Not on file    Attends religious service: Not on file    Active member of club or organization: Not on file    Attends meetings of clubs or organizations: Not on file    Relationship status: Not on file  Other Topics Concern  . Not on file  Social History Narrative   Patient is right-handed. She avoids caffeine. She has recently been using the treadmill.  Past Surgical History:  Procedure Laterality Date  . ACHILLES TENDON SURGERY Right 3/16  . ANTERIOR CERVICAL DECOMP/DISCECTOMY FUSION N/A 03/11/2016   Procedure: C5-6, C6-7 Anterior Cervical Discectomy and Fusion, Allograft, Plate;  Surgeon: Marybelle Killings, MD;  Location: [place];  Service: Orthopedics;  Laterality: N/A;  . CHOLECYSTECTOMY  2014  . COLONOSCOPY WITH PROPOFOL N/A 08/25/2017   Procedure: COLONOSCOPY WITH PROPOFOL;  Surgeon: Manya Silvas, MD;  Location: [facility] ENDOSCOPY;  Service: Endoscopy;  Laterality: N/A;  . CYSTO WITH HYDRODISTENSION N/A 01/26/2014   Procedure: CYSTOSCOPY/HYDRODISTENSION;  Surgeon: Bernestine Amass, MD;  Location: [facility];  Service: Urology;  Laterality: N/A;  . CYSTO/  URETHRAL DILATION/  HYDRODISTENTION/   INSTILLATION THERAPY  07-16-2010//   12-30-2007//    10-27-2006  . EXERCISE TOLERENCE TEST  10-12-2010   NEGATIVE  ADEQUATE ETT/  NO ISCHEMIA OR EVIDENCE HIGH GRADE OBSTRUCTIVE CAD/  NO FURTHER TEST NEEDED  . [place] ENDOMETRIAL ABLATION  2014  . LAPAROSCOPIC OVARIAN CYST BX  2005   AND  URETEROSCOPIC LASER LITHO  STONE EXTRACTION  . SHOULDER OPEN ROTATOR CUFF REPAIR Right 2004  . TONSILLECTOMY    . TRANSTHORACIC ECHOCARDIOGRAM  06-07-2006   normal study/  ef 60-65%  . TUBAL LIGATION Bilateral 1995   Past Medical History:  Diagnosis Date  . Anxiety   . Anxiety disorder   . Arthritis   . Blepharitis   . Chronic low back pain   . Depression   . Dyslipidemia   . Fibromyalgia   . GERD (gastroesophageal reflux disease)   . Headache    hx migraines  . History of kidney stones   . History of panic attacks   . History of renal calculi   . IBS (irritable bowel syndrome)   . Interstitial cystitis   . Lupus ([place])    tested positive for antibodies for lupus. dr to do further tests  . Menorrhagia   . OSA on CPAP    not used cpap for several weeks  . Pneumonia    hx  . PONV (postoperative nausea and vomiting)   . RLS (restless legs syndrome)   . Rosacea   . Seasonal asthma   . SI (sacroiliac) joint dysfunction   . SUI (stress urinary incontinence, female)   . UTI (lower urinary tract infection)    hx  . 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 02/23/2013   There were no vitals taken for this visit.  Opioid Risk Score:   Fall Risk Score:  `1  Depression screen PHQ 2/9  Depression screen [facility] 2/9 08/12/2018 07/20/2018 06/19/2018 04/20/2018 01/29/2018 01/28/2018 12/15/2017  Decreased Interest 0 1 1 0 0 1 2  Down, Depressed, Hopeless 0 1 1 0 0 1 2  PHQ - 2 Score 0 2 2 0 0 2 4  Altered sleeping - - - - - - -  Tired, decreased energy - - - - - - -  Change in appetite - - - - - - -  Feeling bad or failure about yourself  - - - - - - -  Trouble concentrating - - - - - - -  Moving slowly or fidgety/restless - - - - - - -   Suicidal thoughts - - - - - - -  PHQ-9 Score - - - - - - -  Difficult doing work/chores - - - - - - -  Some recent data might be hidden  Review of Systems  Constitutional: Negative.   HENT: Negative.   Eyes: Negative.   Respiratory: Positive for apnea.   Cardiovascular: Negative.   Gastrointestinal: Negative.   Endocrine: Negative.   Genitourinary: Negative.   Musculoskeletal: Positive for arthralgias, back pain, gait problem and myalgias.  Skin: Negative.   Allergic/Immunologic: Negative.   Neurological: Positive for dizziness.  Hematological: Negative.   Psychiatric/Behavioral: The patient is nervous/anxious.   All other systems reviewed and are negative.      Objective:   Physical Exam  General: No acute distress HEENT: EOMI, oral membranes moist Cards: reg rate  Chest: normal effort Abdomen: Soft, NT, ND Skin: dry, intact Extremities: no edema Musculoskeletal: TTP over L5-S1 level to PSIS. Patrick's negative. Facet maneuvers' positive left more than right.   Neurological: She is alertand oriented to person, place, and time. Strength is 5 out of 5 and sensation appears normal Skin: Skin is warmand dry.  Psychiatric: pleasant but anxious.      Assessment & Plan:  1.Lumbar spondylosis/facet arthropathy: Hydrocodone 10/325-1 every 8 hours as needed #90.. -We will continue the controlled substance monitoring program, this consists of regular clinic visits, examinations, routine drug screening, pill counts as well as use of [place] Controlled Substance Reporting System. [place] today.    2. Fibromyalgia.  Revisited the importance of regular aerobic exercise.   3. Anxiety and depression:continue with psych follow up -continue counseling/treatment at [facility] 4. Migraines: On Maxalt.Headaches are rare now 5. OSA : CPAP. 6. Obesity: Following Healthy Diet Regimen 7. Status Post Cervical Spinal  Fusion: C5C6- C-6- C-7 Anterior Cervical Discectomy Fusion and Allograft Plate.: Dr. Lorin Mercy Following 8. Muscle Spasm: Continue Tizanidine 9.  Recent fall which appears to inflamed hser spondylsosi/facet arthropathy  -prednisone taper over 9 days  -consider injection if needed  -stretches were provided.    Follow up with  NP in a month. Fifteen minutes of face to face patient care time were spent during this visit. All questions were encouraged and answered.   [name]

## 2019-03-24 NOTE — Addendum Note (Signed)
Addended by: Caro Hight on: 03/24/2019 11:45 AM   Modules accepted: Orders

## 2019-03-24 NOTE — Patient Instructions (Signed)
PLEASE FEEL FREE TO CALL OUR OFFICE WITH ANY PROBLEMS OR QUESTIONS ([phone])      

## 2019-03-24 NOTE — Telephone Encounter (Signed)
PMP was reviewed. Hydrocodone e-scribed.

## 2019-03-29 DIAGNOSIS — Z6841 Body Mass Index (BMI) 40.0 and over, adult: Secondary | ICD-10-CM | POA: Diagnosis not present

## 2019-03-29 DIAGNOSIS — R42 Dizziness and giddiness: Secondary | ICD-10-CM | POA: Diagnosis not present

## 2019-03-29 DIAGNOSIS — N951 Menopausal and female climacteric states: Secondary | ICD-10-CM | POA: Diagnosis not present

## 2019-03-30 DIAGNOSIS — F411 Generalized anxiety disorder: Secondary | ICD-10-CM | POA: Diagnosis not present

## 2019-03-30 DIAGNOSIS — F332 Major depressive disorder, recurrent severe without psychotic features: Secondary | ICD-10-CM | POA: Diagnosis not present

## 2019-04-07 ENCOUNTER — Ambulatory Visit (INDEPENDENT_AMBULATORY_CARE_PROVIDER_SITE_OTHER): Payer: Medicare Other

## 2019-04-07 ENCOUNTER — Other Ambulatory Visit: Payer: Self-pay

## 2019-04-07 DIAGNOSIS — J301 Allergic rhinitis due to pollen: Secondary | ICD-10-CM

## 2019-04-08 DIAGNOSIS — F411 Generalized anxiety disorder: Secondary | ICD-10-CM | POA: Diagnosis not present

## 2019-04-08 DIAGNOSIS — F332 Major depressive disorder, recurrent severe without psychotic features: Secondary | ICD-10-CM | POA: Diagnosis not present

## 2019-04-14 DIAGNOSIS — E7800 Pure hypercholesterolemia, unspecified: Secondary | ICD-10-CM | POA: Diagnosis not present

## 2019-04-14 DIAGNOSIS — G43009 Migraine without aura, not intractable, without status migrainosus: Secondary | ICD-10-CM | POA: Diagnosis not present

## 2019-04-14 DIAGNOSIS — R7303 Prediabetes: Secondary | ICD-10-CM | POA: Diagnosis not present

## 2019-04-14 DIAGNOSIS — K219 Gastro-esophageal reflux disease without esophagitis: Secondary | ICD-10-CM | POA: Diagnosis not present

## 2019-04-21 ENCOUNTER — Other Ambulatory Visit: Payer: Self-pay

## 2019-04-21 ENCOUNTER — Ambulatory Visit (INDEPENDENT_AMBULATORY_CARE_PROVIDER_SITE_OTHER): Payer: Medicare Other

## 2019-04-21 DIAGNOSIS — Z1239 Encounter for other screening for malignant neoplasm of breast: Secondary | ICD-10-CM | POA: Diagnosis not present

## 2019-04-21 DIAGNOSIS — Z01419 Encounter for gynecological examination (general) (routine) without abnormal findings: Secondary | ICD-10-CM | POA: Diagnosis not present

## 2019-04-21 DIAGNOSIS — J301 Allergic rhinitis due to pollen: Secondary | ICD-10-CM

## 2019-04-21 DIAGNOSIS — Z124 Encounter for screening for malignant neoplasm of cervix: Secondary | ICD-10-CM | POA: Diagnosis not present

## 2019-04-26 ENCOUNTER — Encounter: Payer: Self-pay | Admitting: Registered Nurse

## 2019-04-26 ENCOUNTER — Encounter: Payer: Medicare Other | Attending: Physical Medicine & Rehabilitation | Admitting: Registered Nurse

## 2019-04-26 ENCOUNTER — Other Ambulatory Visit: Payer: Self-pay

## 2019-04-26 VITALS — BP 143/91 | HR 69 | Temp 98.9°F | Ht 63.0 in | Wt 236.0 lb

## 2019-04-26 DIAGNOSIS — R202 Paresthesia of skin: Secondary | ICD-10-CM | POA: Diagnosis not present

## 2019-04-26 DIAGNOSIS — Z5181 Encounter for therapeutic drug level monitoring: Secondary | ICD-10-CM

## 2019-04-26 DIAGNOSIS — M542 Cervicalgia: Secondary | ICD-10-CM

## 2019-04-26 DIAGNOSIS — M62838 Other muscle spasm: Secondary | ICD-10-CM

## 2019-04-26 DIAGNOSIS — Z79899 Other long term (current) drug therapy: Secondary | ICD-10-CM | POA: Diagnosis not present

## 2019-04-26 DIAGNOSIS — M25512 Pain in left shoulder: Secondary | ICD-10-CM

## 2019-04-26 DIAGNOSIS — Z981 Arthrodesis status: Secondary | ICD-10-CM

## 2019-04-26 DIAGNOSIS — F419 Anxiety disorder, unspecified: Secondary | ICD-10-CM

## 2019-04-26 DIAGNOSIS — M797 Fibromyalgia: Secondary | ICD-10-CM | POA: Diagnosis not present

## 2019-04-26 DIAGNOSIS — M47816 Spondylosis without myelopathy or radiculopathy, lumbar region: Secondary | ICD-10-CM | POA: Diagnosis not present

## 2019-04-26 DIAGNOSIS — R200 Anesthesia of skin: Secondary | ICD-10-CM | POA: Insufficient documentation

## 2019-04-26 DIAGNOSIS — G8929 Other chronic pain: Secondary | ICD-10-CM

## 2019-04-26 DIAGNOSIS — M47817 Spondylosis without myelopathy or radiculopathy, lumbosacral region: Secondary | ICD-10-CM | POA: Diagnosis not present

## 2019-04-26 DIAGNOSIS — M25511 Pain in right shoulder: Secondary | ICD-10-CM | POA: Diagnosis not present

## 2019-04-26 DIAGNOSIS — M7062 Trochanteric bursitis, left hip: Secondary | ICD-10-CM | POA: Diagnosis not present

## 2019-04-26 DIAGNOSIS — G894 Chronic pain syndrome: Secondary | ICD-10-CM | POA: Diagnosis not present

## 2019-04-26 MED ORDER — HYDROCODONE-ACETAMINOPHEN 10-325 MG PO TABS: ORAL_TABLET | ORAL | 0 refills | Status: DC

## 2019-04-26 MED FILL — HYDROCODON-APAP 10-325: 10-325 | 30 days supply | Qty: 90 | Fill #0

## 2019-04-26 NOTE — Progress Notes (Signed)
Subjective:    Patient ID: Karina Greer, female    DOB: Nov 25, 1965, 53 y.o.   MRN: [id number]  HPI: Karina Greer is a 53 y.o. female who returns for follow up appointment for chronic pain and medication refill. She states her pain is located in her neck, bilateral shoulders, lower back and left hip. She rates her pain 7. Her current exercise regime is walking and performing stretching exercises.  [name] Morphine equivalent is 30.00  MME.  She is also prescribed Alprazolam by Lorelle Gibbs Np. We have discussed the black box warning of using opioids and benzodiazepines. I highlighted the dangers of using these drugs together and discussed the adverse events including respiratory suppression, overdose, cognitive impairment and importance of compliance with current regimen. We will continue to monitor and adjust as indicated.  She  is being closely monitored and under the care of her psychiatrist.  Pain Inventory Average Pain 9 Pain Right Now 7 My pain is constant, stabbing, tingling and aching  In the last 24 hours, has pain interfered with the following? General activity 6 Relation with others 6 Enjoyment of life 6 What TIME of day is your pain at its worst? all Sleep (in general) Fair  Pain is worse with: walking, bending, sitting, inactivity, standing, unsure and some activites Pain improves with: rest, heat/ice, therapy/exercise, pacing activities, medication and TENS Relief from Meds: 6  Mobility walk without assistance ability to climb steps?  yes do you drive?  yes  Function Do you have any goals in this area?  yes  Neuro/Psych numbness tingling anxiety  Prior Studies Any changes since last visit?  no  Physicians involved in your care Any changes since last visit?  no   Family History  Problem Relation Age of Onset  . Hypertension Mother   . Diabetes Father   . Brain cancer Father   . Fibromyalgia Sister   . Suicidality Sister   . Hypertension Other        Cancer, Cerebrovascular disease run on mother side of family   Social History   Socioeconomic History  . Marital status: Divorced    Spouse name: Not on file  . Number of children: 3  . Years of education: HS  . Highest education level: 12th grade  Occupational History  . Occupation: Merchandiser, retail: UNEMPLOYED    Comment: Disability  Social Needs  . Financial resource strain: Not on file  . Food insecurity    Worry: Not on file    Inability: Not on file  . Transportation needs    Medical: Not on file    Non-medical: Not on file  Tobacco Use  . Smoking status: Never Smoker  . Smokeless tobacco: Never Used  Substance and Sexual Activity  . Alcohol use: No  . Drug use: No  . Sexual activity: Not on file  Lifestyle  . Physical activity    Days per week: Not on file    Minutes per session: Not on file  . Stress: Not on file  Relationships  . Social Herbalist on phone: Not on file    Gets together: Not on file    Attends religious service: Not on file    Active member of club or organization: Not on file    Attends meetings of clubs or organizations: Not on file    Relationship status: Not on file  Other Topics Concern  . Not on file  Social History  Narrative   Patient is right-handed. She avoids caffeine. She has recently been using the treadmill.   Past Surgical History:  Procedure Laterality Date  . ACHILLES TENDON SURGERY Right 3/16  . ANTERIOR CERVICAL DECOMP/DISCECTOMY FUSION N/A 03/11/2016   Procedure: C5-6, C6-7 Anterior Cervical Discectomy and Fusion, Allograft, Plate;  Surgeon: Marybelle Killings, MD;  Location: [place];  Service: Orthopedics;  Laterality: N/A;  . CHOLECYSTECTOMY  2014  . COLONOSCOPY WITH PROPOFOL N/A 08/25/2017   Procedure: COLONOSCOPY WITH PROPOFOL;  Surgeon: Manya Silvas, MD;  Location: [facility] ENDOSCOPY;  Service: Endoscopy;  Laterality: N/A;  . CYSTO WITH HYDRODISTENSION N/A 01/26/2014   Procedure:  CYSTOSCOPY/HYDRODISTENSION;  Surgeon: Bernestine Amass, MD;  Location: [facility];  Service: Urology;  Laterality: N/A;  . CYSTO/  URETHRAL DILATION/  HYDRODISTENTION/   INSTILLATION THERAPY  07-16-2010//   12-30-2007//   10-27-2006  . EXERCISE TOLERENCE TEST  10-12-2010   NEGATIVE  ADEQUATE ETT/  NO ISCHEMIA OR EVIDENCE HIGH GRADE OBSTRUCTIVE CAD/  NO FURTHER TEST NEEDED  . [place] ENDOMETRIAL ABLATION  2014  . LAPAROSCOPIC OVARIAN CYST BX  2005   AND  URETEROSCOPIC LASER LITHO  STONE EXTRACTION  . SHOULDER OPEN ROTATOR CUFF REPAIR Right 2004  . TONSILLECTOMY    . TRANSTHORACIC ECHOCARDIOGRAM  06-07-2006   normal study/  ef 60-65%  . TUBAL LIGATION Bilateral 1995   Past Medical History:  Diagnosis Date  . Anxiety   . Anxiety disorder   . Arthritis   . Blepharitis   . Chronic low back pain   . Depression   . Dyslipidemia   . Fibromyalgia   . GERD (gastroesophageal reflux disease)   . Headache    hx migraines  . History of kidney stones   . History of panic attacks   . History of renal calculi   . IBS (irritable bowel syndrome)   . Interstitial cystitis   . Lupus (Newborn)    tested positive for antibodies for lupus. dr to do further tests  . Menorrhagia   . OSA on CPAP    not used cpap for several weeks  . Pneumonia    hx  . PONV (postoperative nausea and vomiting)   . RLS (restless legs syndrome)   . Rosacea   . Seasonal asthma   . SI (sacroiliac) joint dysfunction   . SUI (stress urinary incontinence, female)   . UTI (lower urinary tract infection)    hx  . 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 02/23/2013   BP (!) 143/91   Pulse 69   Temp 98.9 F (37.2 C)   Ht 5\' 3"  (1.6 m)   Wt 236 lb (107 kg)   SpO2 97%   BMI 41.81 kg/m   Opioid Risk Score:   Fall Risk Score:  `1  Depression screen PHQ 2/9  Depression screen [facility] 2/9 08/12/2018 07/20/2018 06/19/2018 04/20/2018 01/29/2018 01/28/2018 12/15/2017  Decreased Interest 0 1 1 0 0 1 2   Down, Depressed, Hopeless 0 1 1 0 0 1 2  PHQ - 2 Score 0 2 2 0 0 2 4  Altered sleeping - - - - - - -  Tired, decreased energy - - - - - - -  Change in appetite - - - - - - -  Feeling bad or failure about yourself  - - - - - - -  Trouble concentrating - - - - - - -  Moving slowly or fidgety/restless - - - - - - -  Suicidal thoughts - - - - - - -  PHQ-9 Score - - - - - - -  Difficult doing work/chores - - - - - - -  Some recent data might be hidden     Review of Systems  Constitutional: Negative.   Eyes: Positive for pain, discharge and redness.  Respiratory: Negative.   Cardiovascular: Negative.   Gastrointestinal: Negative.   Endocrine: Negative.   Genitourinary: Negative.   Musculoskeletal: Positive for arthralgias, back pain and myalgias.  Skin: Negative.   Allergic/Immunologic: Negative.   Neurological: Negative.   Hematological: Negative.   Psychiatric/Behavioral: The patient is nervous/anxious.   All other systems reviewed and are negative.      Objective:   Physical Exam Vitals signs and nursing note reviewed.  Constitutional:      Appearance: Normal appearance. She is obese.  Neck:     Musculoskeletal: Normal range of motion and neck supple.  Cardiovascular:     Rate and Rhythm: Normal rate and regular rhythm.     Pulses: Normal pulses.     Heart sounds: Normal heart sounds.  Pulmonary:     Effort: Pulmonary effort is normal.     Breath sounds: Normal breath sounds.  Musculoskeletal:     Comments: Normal Muscle Bulk and Muscle Testing Reveals:  Upper Extremities: Full ROM and Muscle Strength 5/5  Thoracic, Lumbar Lower Extremities Gait   Skin:    General: Skin is warm and dry.  Neurological:     Mental Status: She is alert and oriented to person, place, and time.  Psychiatric:        Mood and Affect: Mood normal.        Behavior: Behavior normal.           Assessment & Plan:  1. Lumbago/ Lumbar Spondylosis/LeftLumbar Radiculitis: Continue to  Monitor.04/26/2019. Refilled: HYDROcodone 10/325mg  one tablet every8hours as needed #90.  We will continue the opioid monitoring program, this consists of regular clinic visits, examinations, urine drug screen, pill counts as well as use of [place] Controlled Substance Reporting System. 2. Fibromyalgia. Continue Current exercise Regime.04/26/2019 3. Anxiety and depression:Psychiatry Following[facility]. Continue Counseling at [facility]. On Xanax,04/26/2019 4. Migraines: On Maxalt. Neurology Following.04/26/2019 5. OSA : Continue exercise regime as tolerated and losing weight.04/26/2019 6. Obesity: Following Healthy Diet Regimen.04/26/2019 7. Status Post Cervical Spinal Fusion: C5C6- C-6- C-7 Anterior Cervical Discectomy Fusion and Allograft Plate:Dr. Lorin Mercy Following.04/26/2019 8. Cervicalgia/ Cervical Radiculitis/ S/P Cervical Spinal Fusion: Continueto MonitorDr. Yates Following.04/26/2019 9. Muscle Spasm: Continue Tizanidine as needed.04/26/2019 10.Hereditary and Idiopathic Peripheral Neuropathy Continue with Tens Unit.04/26/2019 11.Left Greater Trochanteric Bursitis: Continue to Alternate Ice andheat Therapy. Continue to Monitor. 04/26/2019.  20 minutes of face to face patient care time was spent during this visit. All questions were encouraged and answered.  F/U in 1 month

## 2019-04-27 DIAGNOSIS — J019 Acute sinusitis, unspecified: Secondary | ICD-10-CM | POA: Diagnosis not present

## 2019-04-27 DIAGNOSIS — Z6841 Body Mass Index (BMI) 40.0 and over, adult: Secondary | ICD-10-CM | POA: Diagnosis not present

## 2019-04-29 ENCOUNTER — Ambulatory Visit: Payer: Self-pay | Admitting: Internal Medicine

## 2019-04-30 DIAGNOSIS — H9202 Otalgia, left ear: Secondary | ICD-10-CM | POA: Diagnosis not present

## 2019-04-30 DIAGNOSIS — Z6841 Body Mass Index (BMI) 40.0 and over, adult: Secondary | ICD-10-CM | POA: Diagnosis not present

## 2019-05-03 DIAGNOSIS — F332 Major depressive disorder, recurrent severe without psychotic features: Secondary | ICD-10-CM | POA: Diagnosis not present

## 2019-05-03 DIAGNOSIS — F411 Generalized anxiety disorder: Secondary | ICD-10-CM | POA: Diagnosis not present

## 2019-05-05 ENCOUNTER — Telehealth: Payer: Self-pay | Admitting: Orthopaedic Surgery

## 2019-05-05 NOTE — Telephone Encounter (Signed)
Returned call to patient left message on voicemail to return call to schedule an appointment with Dr Lorin Mercy for he neck

## 2019-05-06 DIAGNOSIS — M792 Neuralgia and neuritis, unspecified: Secondary | ICD-10-CM | POA: Diagnosis not present

## 2019-05-06 DIAGNOSIS — H9202 Otalgia, left ear: Secondary | ICD-10-CM | POA: Diagnosis not present

## 2019-05-07 ENCOUNTER — Ambulatory Visit: Payer: Medicare Other | Admitting: Orthopaedic Surgery

## 2019-05-07 ENCOUNTER — Other Ambulatory Visit: Payer: Self-pay | Admitting: Unknown Physician Specialty

## 2019-05-07 DIAGNOSIS — M792 Neuralgia and neuritis, unspecified: Secondary | ICD-10-CM

## 2019-05-07 DIAGNOSIS — J029 Acute pharyngitis, unspecified: Secondary | ICD-10-CM

## 2019-05-07 DIAGNOSIS — H9202 Otalgia, left ear: Secondary | ICD-10-CM

## 2019-05-07 DIAGNOSIS — M542 Cervicalgia: Secondary | ICD-10-CM

## 2019-05-07 DIAGNOSIS — G521 Disorders of glossopharyngeal nerve: Secondary | ICD-10-CM

## 2019-05-11 ENCOUNTER — Other Ambulatory Visit: Payer: Self-pay | Admitting: Unknown Physician Specialty

## 2019-05-11 DIAGNOSIS — M792 Neuralgia and neuritis, unspecified: Secondary | ICD-10-CM

## 2019-05-11 DIAGNOSIS — H9202 Otalgia, left ear: Secondary | ICD-10-CM

## 2019-05-11 DIAGNOSIS — G521 Disorders of glossopharyngeal nerve: Secondary | ICD-10-CM

## 2019-05-11 DIAGNOSIS — J029 Acute pharyngitis, unspecified: Secondary | ICD-10-CM

## 2019-05-15 ENCOUNTER — Encounter: Payer: Self-pay | Admitting: Emergency Medicine

## 2019-05-15 ENCOUNTER — Emergency Department
Admission: EM | Admit: 2019-05-15 | Discharge: 2019-05-15 | Disposition: A | Discharge status: Home / Self Care | Payer: Medicare Other | Attending: Emergency Medicine | Admitting: Emergency Medicine

## 2019-05-15 ENCOUNTER — Ambulatory Visit
Admission: RE | Admit: 2019-05-15 | Discharge: 2019-05-15 | Disposition: A | Discharge status: Home / Self Care | Payer: Medicare Other | Source: Ambulatory Visit | Attending: Unknown Physician Specialty | Admitting: Unknown Physician Specialty

## 2019-05-15 ENCOUNTER — Other Ambulatory Visit: Payer: Self-pay

## 2019-05-15 DIAGNOSIS — M792 Neuralgia and neuritis, unspecified: Secondary | ICD-10-CM | POA: Insufficient documentation

## 2019-05-15 DIAGNOSIS — T7840XA Allergy, unspecified, initial encounter: Secondary | ICD-10-CM | POA: Insufficient documentation

## 2019-05-15 DIAGNOSIS — Z79899 Other long term (current) drug therapy: Secondary | ICD-10-CM | POA: Insufficient documentation

## 2019-05-15 DIAGNOSIS — R110 Nausea: Secondary | ICD-10-CM | POA: Diagnosis not present

## 2019-05-15 DIAGNOSIS — L539 Erythematous condition, unspecified: Secondary | ICD-10-CM | POA: Diagnosis not present

## 2019-05-15 DIAGNOSIS — L509 Urticaria, unspecified: Secondary | ICD-10-CM | POA: Insufficient documentation

## 2019-05-15 DIAGNOSIS — J029 Acute pharyngitis, unspecified: Secondary | ICD-10-CM | POA: Insufficient documentation

## 2019-05-15 DIAGNOSIS — G521 Disorders of glossopharyngeal nerve: Secondary | ICD-10-CM | POA: Insufficient documentation

## 2019-05-15 DIAGNOSIS — R51 Headache: Secondary | ICD-10-CM | POA: Diagnosis not present

## 2019-05-15 DIAGNOSIS — H9202 Otalgia, left ear: Secondary | ICD-10-CM | POA: Insufficient documentation

## 2019-05-15 MED ORDER — HYDROXYZINE HCL 25 MG PO TABS
25.0000 mg | ORAL_TABLET | Freq: Once | ORAL | Status: AC
  Administered 2019-05-15: 25 mg via ORAL
  Filled 2019-05-15: qty 1

## 2019-05-15 MED ORDER — HYDROXYZINE HCL 10 MG PO TABS: 10.0000 mg | ORAL_TABLET | Freq: Three times a day (TID) | ORAL | 0 refills | Status: DC | PRN

## 2019-05-15 MED ORDER — GADOBUTROL 1 MMOL/ML IV SOLN
10.0000 mL | Freq: Once | INTRAVENOUS | Status: AC | PRN
  Administered 2019-05-15: 10 mL via INTRAVENOUS

## 2019-05-15 NOTE — ED Triage Notes (Addendum)
Pt from outpatient MRI with allergic reaction to contrast dye, pt has facial swelling and redness noted. Pt denies any shortness of breath or swelling in throat.   Pt reports she feels like her face is burning. Pt states sxs are not getting worse and are about the same.

## 2019-05-15 NOTE — ED Notes (Signed)
FIRST NURSE NOTE: Pt reports allergic reaction to contrast dye from MRI, pt states she has hives and facial swelling.

## 2019-05-15 NOTE — Discharge Instructions (Signed)
You are allergic to Gadolinium (MRI contrast)

## 2019-05-15 NOTE — ED Notes (Signed)
Pt with redness to tops of ears, face, forehead. Pt had MRI with contrast today and states she had hives all over neck and face, along with nausea. Pt still has nausea and headache. No hives present at this assessment

## 2019-05-15 NOTE — ED Provider Notes (Signed)
[facility] Emergency Department Provider Note   ____________________________________________   First MD Initiated Contact with Patient 05/15/19 1542     (approximate)  I have reviewed the triage vital signs and the nursing notes.   HISTORY  Chief Complaint Allergic Reaction    HPI Karina Greer is a 53 y.o. female who is undergoing MRI for evaluation of possible neuralgias in her neck  She reports as soon as she was administered the IV gadolinium she started feeling hot across her face.  She then over the next couple minutes now she is developed hives and redness over both cheeks, across her face.  No trouble breathing.  No lightheadedness.  No weakness felt.  Felt little bit nausea and also caused a mild headache.  She is here now, and she reports that the symptoms are slowly improving but she still feels warm and slightly red across the face  The hives have gone away.  She did not take any medication to alleviate this.  No chest pain.  No trouble breathing   Past Medical History:  Diagnosis Date  . Anxiety   . Anxiety disorder   . Arthritis   . Blepharitis   . Chronic low back pain   . Depression   . Dyslipidemia   . Fibromyalgia   . GERD (gastroesophageal reflux disease)   . Headache    hx migraines  . History of kidney stones   . History of panic attacks   . History of renal calculi   . IBS (irritable bowel syndrome)   . Interstitial cystitis   . Lupus ([place])    tested positive for antibodies for lupus. dr to do further tests  . Menorrhagia   . OSA on CPAP    not used cpap for several weeks  . Pneumonia    hx  . PONV (postoperative nausea and vomiting)   . RLS (restless legs syndrome)   . Rosacea   . Seasonal asthma   . SI (sacroiliac) joint dysfunction   . SUI (stress urinary incontinence, female)   . UTI (lower urinary tract infection)    hx  . 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 02/23/2013    Patient Active Problem  List   Diagnosis Date Noted  . Atherosclerosis of aorta ([place]) 10/28/2018  . Elevated glucose 10/28/2018  . Mixed hyperlipidemia 10/28/2018  . Hypothyroidism 07/20/2018  . LLQ pain 12/03/2017  . Pre-diabetes 06/03/2017  . Rectal bleeding 06/02/2017  . S/P cervical spinal fusion 03/11/2016  . Bilateral hand pain 11/29/2015  . Elevated C-reactive protein 11/29/2015  . Elevated rheumatoid factor 11/29/2015  . Raynaud's phenomenon without gangrene 11/29/2015  . Bloating 09/12/2014  . Dizziness 08/08/2014  . Peri-menopause 08/08/2014  . Chronic tension-type headache, intractable 07/13/2014  . Gastroesophageal reflux disease without esophagitis 07/12/2014  . Anxiety 04/18/2014  . Chronic arthritis 04/18/2014  . Cystitis 04/18/2014  . Morbid obesity ([place]) 04/18/2014  . Fibromyalgia 04/18/2014  . 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 Arthropathy 04/18/2014  . Cephalalgia 04/04/2014  . Numbness and tingling 04/04/2014  . 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 Nonspecific abnormal findings on radiological and other examination of skull and head 02/23/2013  . Other nonspecific abnormal result of function study of brain and central nervous system 07/01/2012  . Disturbance of skin sensation 07/01/2012  . Migraine without aura 07/01/2012  . Abdominal pain 02/11/2012  . Chronic fatigue syndrome 02/11/2012  .  Chronic interstitial cystitis 02/11/2012  . Dyspareunia 02/11/2012  . Dysuria 02/11/2012  . Female stress incontinence 02/11/2012  . Irritable bowel syndrome with diarrhea 02/11/2012  . Nausea and vomiting 02/11/2012  . Nocturia 02/11/2012  . Urge incontinence 02/11/2012  . Urinary urgency 02/11/2012  . Lumbar spondylosis 02/04/2012  . Myalgia and myositis 02/04/2012  . Depression 02/04/2012  . Lumbosacral spondylosis without myelopathy 02/04/2012  . OBESITY, UNSPECIFIED 10/03/2010  . GENERALIZED ANXIETY  DISORDER 10/03/2010  . Chest pain 10/03/2010    Past Surgical History:  Procedure Laterality Date  . ACHILLES TENDON SURGERY Right 3/16  . ANTERIOR CERVICAL DECOMP/DISCECTOMY FUSION N/A 03/11/2016   Procedure: C5-6, C6-7 Anterior Cervical Discectomy and Fusion, Allograft, Plate;  Surgeon: Marybelle Killings, MD;  Location: [place];  Service: Orthopedics;  Laterality: N/A;  . CHOLECYSTECTOMY  2014  . COLONOSCOPY WITH PROPOFOL N/A 08/25/2017   Procedure: COLONOSCOPY WITH PROPOFOL;  Surgeon: Manya Silvas, MD;  Location: [facility] ENDOSCOPY;  Service: Endoscopy;  Laterality: N/A;  . CYSTO WITH HYDRODISTENSION N/A 01/26/2014   Procedure: CYSTOSCOPY/HYDRODISTENSION;  Surgeon: Bernestine Amass, MD;  Location: [facility];  Service: Urology;  Laterality: N/A;  . CYSTO/  URETHRAL DILATION/  HYDRODISTENTION/   INSTILLATION THERAPY  07-16-2010//   12-30-2007//   10-27-2006  . EXERCISE TOLERENCE TEST  10-12-2010   NEGATIVE  ADEQUATE ETT/  NO ISCHEMIA OR EVIDENCE HIGH GRADE OBSTRUCTIVE CAD/  NO FURTHER TEST NEEDED  . [place] ENDOMETRIAL ABLATION  2014  . LAPAROSCOPIC OVARIAN CYST BX  2005   AND  URETEROSCOPIC LASER LITHO  STONE EXTRACTION  . SHOULDER OPEN ROTATOR CUFF REPAIR Right 2004  . TONSILLECTOMY    . TRANSTHORACIC ECHOCARDIOGRAM  06-07-2006   normal study/  ef 60-65%  . TUBAL LIGATION Bilateral 1995    Prior to Admission medications   Medication Sig Start Date End Date Taking? Authorizing Provider  albuterol (PROVENTIL) (2.5 MG/3ML) 0.083% nebulizer solution INHALE 1 VIAL VIA NEBULIZER EVERY 6 HOURS AS NEEDED 03/18/15   [provider]  ALPRAZolam (XANAX) 0.25 MG tablet Take 0.25 mg by mouth 2 (two) times daily as needed for anxiety.     [provider]  azelastine (ASTELIN) 0.1 % nasal spray USE 2 SPRAYS INTO EACH NOSTRIL TWICE A DAY 09/21/18   Kendell Bane, NP  b complex vitamins tablet Take 1 tablet by mouth daily.    [provider]   DEXILANT 30 MG capsule Take 1 capsule by mouth 2 (two) times daily. 02/14/16   [provider]  dicyclomine (BENTYL) 20 MG tablet Take 20 mg by mouth every 6 (six) hours.    [provider]  EPIPEN 2-PAK 0.3 MG/0.3ML SOAJ injection Use as directed 03/17/18   Ronnell Freshwater, NP  FLOVENT HFA 110 MCG/ACT inhaler INHALE 1 PUFF BY MOUTH TWICE A DAY 02/25/19   Kendell Bane, NP  HYDROcodone-acetaminophen (NORCO) 10-325 MG tablet TAKE 1 TABLET BY MOUTH EVERY 8 HOURS AS NEEDED FOR PAIN 04/26/19   Bayard Hugger, NP  hydrOXYzine (ATARAX/VISTARIL) 10 MG tablet Take 1 tablet (10 mg total) by mouth 3 (three) times daily as needed. 05/15/19   Delman Kitten, MD  meclizine (ANTIVERT) 25 MG tablet Take 1 tablet by mouth 3 (three) times daily as needed. 12/23/13   [provider]  montelukast (SINGULAIR) 10 MG tablet Take 10 mg by mouth every morning.    [provider]  ondansetron (ZOFRAN-ODT) 4 MG disintegrating tablet Take 1 tablet  by mouth 3 (three) times daily as needed. 02/02/14   [provider]  penicillin v potassium (VEETID) 500 MG tablet Take 500 mg by mouth 4 (four) times daily.    [provider]  predniSONE (DELTASONE) 10 MG tablet 2 tab twice daily for 3 days, 1 tab twice daily for 3 days, 1 tab daily for 3 days and off. 03/24/19   Meredith Staggers, MD  PRESCRIPTION MEDICATION every 14 (fourteen) days. 2 ALLERGY INJECTIONS EVERY 2 WKS    [provider]  PROAIR HFA 108 (90 Base) MCG/ACT inhaler INHALE 2 PUFFS BY MOUTH EVERY 4 TO 6 HOURS AS NEEDED 11/03/18   Allyne Gee, MD  sucralfate (CARAFATE) 1 g tablet Take 1 g by mouth 4 (four) times daily -  with meals and at bedtime.    [provider]  tiZANidine (ZANAFLEX) 2 MG tablet TAKE 1 TABLET (2 MG TOTAL) BY MOUTH EVERY 8 (EIGHT) HOURS AS NEEDED. 03/09/19   Bayard Hugger, NP  Vitamin D, Ergocalciferol, (DRISDOL) 50000 units CAPS capsule Take 50,000 Units by mouth every 7 (seven)  days. [place]    [provider]    Allergies Alendronate sodium, Doxycycline, Milnacipran hcl, Elavil [amitriptyline hcl], Gadolinium derivatives, Tramadol, Codeine sulfate, Cymbalta [duloxetine hcl], Duloxetine, Lamotrigine, Morphine, Nsaids, Other, Skelaxin, Soolantra [ivermectin], Tessalon [benzonatate], Oxycodone, Oxycodone-acetaminophen, and Tetracycline  Family History  Problem Relation Age of Onset  . Hypertension Mother   . Diabetes Father   . Brain cancer Father   . Fibromyalgia Sister   . Suicidality Sister   . Hypertension Other        Cancer, Cerebrovascular disease run on mother side of family    Social History Social History   Tobacco Use  . Smoking status: Never Smoker  . Smokeless tobacco: Never Used  Substance Use Topics  . Alcohol use: No  . Drug use: No    Review of Systems Constitutional: No fever/chills Eyes: No visual changes. ENT: No sore throat. Cardiovascular: Denies chest pain. Respiratory: Denies shortness of breath. Gastrointestinal: No abdominal pain.  Some nausea, mild. Genitourinary: Negative for dysuria. Musculoskeletal: Negative for back pain. Skin: Negative for rash. Neurological: Negative for areas of focal weakness or numbness.    ____________________________________________   PHYSICAL EXAM:  VITAL SIGNS: ED Triage Vitals  Enc Vitals Group     BP 05/15/19 1520 (!) 154/77     Pulse Rate 05/15/19 1520 84     Resp 05/15/19 1520 18     Temp 05/15/19 1520 98.2 F (36.8 C)     Temp Source 05/15/19 1520 Oral     SpO2 05/15/19 1520 97 %     Weight 05/15/19 1521 236 lb (107 kg)     Height 05/15/19 1521 5\' 3"  (1.6 m)     Head Circumference --      Peak Flow --      Pain Score 05/15/19 1521 0     Pain Loc --      Pain Edu? --      Excl. in [place]? --     Constitutional: Alert and oriented. Well appearing and in no acute distress. Eyes: Conjunctivae are normal. Head: Atraumatic.  Some slight erythema over both  maxillary regions as well as some under both eyelids.  No urticaria. Nose: No congestion/rhinnorhea. Mouth/Throat: Mucous membranes are moist.  Oropharynx is widely patent.  No swelling or edema. Neck: No stridor.  No swelling or edema. Cardiovascular: Normal rate, regular rhythm. Grossly normal heart sounds.  Good peripheral circulation. Respiratory: Normal respiratory effort.  No retractions. Lungs CTAB. Gastrointestinal: Soft and nontender. No distention. Musculoskeletal: No lower extremity tenderness nor edema.  No redness swelling or signs of skin reaction around her right AC IV site. Neurologic:  Normal speech and language. No gross focal neurologic deficits are appreciated.  Skin:  Skin is warm, dry and intact. No rash noted. Psychiatric: Mood and affect are normal. Speech and behavior are normal.  ____________________________________________   LABS (all labs ordered are listed, but only abnormal results are displayed)  Labs Reviewed - No data to display ____________________________________________  EKG   ____________________________________________  RADIOLOGY   ____________________________________________   PROCEDURES  Procedure(s) performed: None  Procedures  Critical Care performed: No  ____________________________________________   INITIAL IMPRESSION / ASSESSMENT AND PLAN / ED COURSE  Pertinent labs & imaging results that were available during my care of the patient were reviewed by me and considered in my medical decision making (see chart for details).   Patient seems to have had a mild allergic reaction to gadolinium contrast.  She is presently awake alert well oriented and her symptoms are starting to improve.  She reports she is currently on prednisone taper from her ear nose and throat doctor.  Benadryl caused her to have palpitations, and she reports she is used hydroxyzine well in the past when she is had allergic reaction.  Will give dose of  hydroxyzine orally.  Monitor her, she appears quite well without any signs or symptoms suggest anaphylaxis at this time.  Clinical Course as of May 15 1643  Sat May 15, 2019  1614 Patient reevaluated, improving.  Rash improving.  Reports she has a headache, has history of similar in the past, does not wish for any medication for it reports she thinks it is getting better as the reaction over the face is improving.   [MQ]    Clinical Course User Index [MQ] Delman Kitten, MD   ----------------------------------------- 4:44 PM on 05/15/2019 -----------------------------------------  Patient's rash over the face completely gone.  Asymptomatic.  Reports her headache is almost gone as well.  Appears well, comfortable with plan for discharge.  Has EpiPen at home, discussed signs and symptoms that would indicate need for that and then signs and symptoms to prompt 911 call or return to the emergency room right away.  Patient agreeable  Return precautions and treatment recommendations and follow-up discussed with the patient who is agreeable with the plan.  Husband driving her home  ____________________________________________   FINAL CLINICAL IMPRESSION(S) / ED DIAGNOSES  Final diagnoses:  Allergic reaction, initial encounter        Note:  This document was prepared using Dragon voice recognition software and may include unintentional dictation errors       Delman Kitten, MD 05/15/19 1645

## 2019-05-18 DIAGNOSIS — M792 Neuralgia and neuritis, unspecified: Secondary | ICD-10-CM | POA: Diagnosis not present

## 2019-05-18 DIAGNOSIS — H9202 Otalgia, left ear: Secondary | ICD-10-CM | POA: Diagnosis not present

## 2019-05-24 ENCOUNTER — Encounter (HOSPITAL_BASED_OUTPATIENT_CLINIC_OR_DEPARTMENT_OTHER): Payer: Medicare Other | Admitting: Registered Nurse

## 2019-05-24 ENCOUNTER — Ambulatory Visit: Payer: Self-pay | Admitting: Internal Medicine

## 2019-05-24 ENCOUNTER — Other Ambulatory Visit: Payer: Self-pay

## 2019-05-24 VITALS — BP 144/94 | HR 81 | Temp 98.1°F | Ht 63.0 in | Wt 238.4 lb

## 2019-05-24 DIAGNOSIS — M47816 Spondylosis without myelopathy or radiculopathy, lumbar region: Secondary | ICD-10-CM | POA: Diagnosis not present

## 2019-05-24 DIAGNOSIS — M542 Cervicalgia: Secondary | ICD-10-CM | POA: Diagnosis not present

## 2019-05-24 DIAGNOSIS — M25511 Pain in right shoulder: Secondary | ICD-10-CM | POA: Diagnosis not present

## 2019-05-24 DIAGNOSIS — G894 Chronic pain syndrome: Secondary | ICD-10-CM | POA: Diagnosis not present

## 2019-05-24 DIAGNOSIS — M25512 Pain in left shoulder: Secondary | ICD-10-CM

## 2019-05-24 DIAGNOSIS — M797 Fibromyalgia: Secondary | ICD-10-CM | POA: Diagnosis not present

## 2019-05-24 DIAGNOSIS — G8929 Other chronic pain: Secondary | ICD-10-CM | POA: Diagnosis not present

## 2019-05-24 DIAGNOSIS — M5412 Radiculopathy, cervical region: Secondary | ICD-10-CM

## 2019-05-24 DIAGNOSIS — R200 Anesthesia of skin: Secondary | ICD-10-CM | POA: Diagnosis not present

## 2019-05-24 DIAGNOSIS — Z5181 Encounter for therapeutic drug level monitoring: Secondary | ICD-10-CM

## 2019-05-24 DIAGNOSIS — Z981 Arthrodesis status: Secondary | ICD-10-CM

## 2019-05-24 DIAGNOSIS — Z79899 Other long term (current) drug therapy: Secondary | ICD-10-CM | POA: Diagnosis not present

## 2019-05-24 DIAGNOSIS — M62838 Other muscle spasm: Secondary | ICD-10-CM | POA: Diagnosis not present

## 2019-05-24 DIAGNOSIS — R202 Paresthesia of skin: Secondary | ICD-10-CM | POA: Diagnosis not present

## 2019-05-24 MED ORDER — HYDROCODONE-ACETAMINOPHEN 10-325 MG PO TABS: ORAL_TABLET | ORAL | 0 refills | Status: DC

## 2019-05-24 NOTE — Progress Notes (Signed)
Subjective:    Patient ID: Karina Greer, female    DOB: [birth year], 53 y.o.   MRN: [id number]  HPI: Karina Greer is a 53 y.o. female who returns for follow up appointment for chronic pain and medication refill. She states her pain is located in her neck radiating into her bilateral shoulders and mid- lower back pain. She rates her pain 7. Her current exercise regime is walking and performing stretching exercises.  [name] Morphine equivalent is 30.00  MME. Last UDS was Performed on 02/22/2019, it was consistent.   Pain Inventory Average Pain 9 Pain Right Now 7 My pain is intermittent, constant, stabbing, tingling and aching  In the last 24 hours, has pain interfered with the following? General activity 8 Relation with others 8 Enjoyment of life 9 What TIME of day is your pain at its worst? all day Sleep (in general) Fair  Pain is worse with: walking, bending, sitting, inactivity, standing and some activites Pain improves with: rest, heat/ice, therapy/exercise, pacing activities, medication and TENS Relief from Meds: 7  Mobility walk without assistance how many minutes can you walk? 15 ability to climb steps?  yes do you drive?  yes transfers alone Do you have any goals in this area?  yes  Function Do you have any goals in this area?  yes  Neuro/Psych dizziness anxiety  Prior Studies Any changes since last visit?  no  Physicians involved in your care Any changes since last visit?  no   Family History  Problem Relation Age of Onset  . Hypertension Mother   . Diabetes Father   . Brain cancer Father   . Fibromyalgia Sister   . Suicidality Sister   . Hypertension Other        Cancer, Cerebrovascular disease run on mother side of family   Social History   Socioeconomic History  . Marital status: Divorced    Spouse name: Not on file  . Number of children: 3  . Years of education: HS  . Highest education level: 12th grade  Occupational History  .  Occupation: Merchandiser, retail: UNEMPLOYED    Comment: Disability  Social Needs  . Financial resource strain: Not on file  . Food insecurity    Worry: Not on file    Inability: Not on file  . Transportation needs    Medical: Not on file    Non-medical: Not on file  Tobacco Use  . Smoking status: Never Smoker  . Smokeless tobacco: Never Used  Substance and Sexual Activity  . Alcohol use: No  . Drug use: No  . Sexual activity: Not on file  Lifestyle  . Physical activity    Days per week: Not on file    Minutes per session: Not on file  . Stress: Not on file  Relationships  . Social Herbalist on phone: Not on file    Gets together: Not on file    Attends religious service: Not on file    Active member of club or organization: Not on file    Attends meetings of clubs or organizations: Not on file    Relationship status: Not on file  Other Topics Concern  . Not on file  Social History Narrative   Patient is right-handed. She avoids caffeine. She has recently been using the treadmill.   Past Surgical History:  Procedure Laterality Date  . ACHILLES TENDON SURGERY Right 3/16  . ANTERIOR CERVICAL DECOMP/DISCECTOMY FUSION N/A  03/11/2016   Procedure: C5-6, C6-7 Anterior Cervical Discectomy and Fusion, Allograft, Plate;  Surgeon: Marybelle Killings, MD;  Location: [facility];  Service: Orthopedics;  Laterality: N/A;  . CHOLECYSTECTOMY  2014  . COLONOSCOPY WITH PROPOFOL N/A 08/25/2017   Procedure: COLONOSCOPY WITH PROPOFOL;  Surgeon: Manya Silvas, MD;  [facility] ENDOSCOPY;  Service: Endoscopy;  Laterality: N/A;  . CYSTO WITH HYDRODISTENSION N/A 01/26/2014   Procedure: CYSTOSCOPY/HYDRODISTENSION;  Surgeon: Bernestine Amass, MD;  Location: [facility];  Service: Urology;  Laterality: N/A;  . CYSTO/  URETHRAL DILATION/  HYDRODISTENTION/   INSTILLATION THERAPY  07-16-2010//   12-30-2007//   10-27-2006  . EXERCISE TOLERENCE TEST  10-12-2010   NEGATIVE  ADEQUATE  ETT/  NO ISCHEMIA OR EVIDENCE HIGH GRADE OBSTRUCTIVE CAD/  NO FURTHER TEST NEEDED  . [place] ENDOMETRIAL ABLATION  2014  . LAPAROSCOPIC OVARIAN CYST BX  2005   AND  URETEROSCOPIC LASER LITHO  STONE EXTRACTION  . SHOULDER OPEN ROTATOR CUFF REPAIR Right 2004  . TONSILLECTOMY    . TRANSTHORACIC ECHOCARDIOGRAM  06-07-2006   normal study/  ef 60-65%  . TUBAL LIGATION Bilateral 1995   Past Medical History:  Diagnosis Date  . Anxiety   . Anxiety disorder   . Arthritis   . Blepharitis   . Chronic low back pain   . Depression   . Dyslipidemia   . Fibromyalgia   . GERD (gastroesophageal reflux disease)   . Headache    hx migraines  . History of kidney stones   . History of panic attacks   . History of renal calculi   . IBS (irritable bowel syndrome)   . Interstitial cystitis   . Lupus ([place])    tested positive for antibodies for lupus. dr to do further tests  . Menorrhagia   . OSA on CPAP    not used cpap for several weeks  . Pneumonia    hx  . PONV (postoperative nausea and vomiting)   . RLS (restless legs syndrome)   . Rosacea   . Seasonal asthma   . SI (sacroiliac) joint dysfunction   . SUI (stress urinary incontinence, female)   . UTI (lower urinary tract infection)    hx  . 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 02/23/2013   BP (!) 144/94 (BP Location: Left Arm)   Pulse 81   Temp 98.1 F (36.7 C)   Ht 5\' 3"  (1.6 m)   Wt 238 lb 6.4 oz (108.1 kg)   SpO2 97%   BMI 42.23 kg/m   Opioid Risk Score:   Fall Risk Score:  `1  Depression screen PHQ 2/9  Depression screen [facility] 2/9 08/12/2018 07/20/2018 06/19/2018 04/20/2018 01/29/2018 01/28/2018 12/15/2017  Decreased Interest 0 1 1 0 0 1 2  Down, Depressed, Hopeless 0 1 1 0 0 1 2  PHQ - 2 Score 0 2 2 0 0 2 4  Altered sleeping - - - - - - -  Tired, decreased energy - - - - - - -  Change in appetite - - - - - - -  Feeling bad or failure about yourself  - - - - - - -  Trouble concentrating - - - - - - -   Moving slowly or fidgety/restless - - - - - - -  Suicidal thoughts - - - - - - -  PHQ-9 Score - - - - - - -  Difficult doing work/chores - - - - - - -  Some recent data might be hidden    Review of Systems  Constitutional: Negative.   HENT: Negative.   Eyes: Negative.   Respiratory: Negative.   Cardiovascular: Negative.   Gastrointestinal: Negative.   Endocrine: Negative.   Genitourinary: Negative.   Musculoskeletal: Negative.   Skin: Negative.   Allergic/Immunologic: Negative.   Neurological: Positive for dizziness.  Hematological: Negative.   Psychiatric/Behavioral: The patient is nervous/anxious.   All other systems reviewed and are negative.      Objective:   Physical Exam Vitals signs and nursing note reviewed.  Constitutional:      Appearance: Normal appearance.  Neck:     Musculoskeletal: Normal range of motion and neck supple.  Cardiovascular:     Rate and Rhythm: Normal rate and regular rhythm.     Pulses: Normal pulses.     Heart sounds: Normal heart sounds.  Pulmonary:     Effort: Pulmonary effort is normal.     Breath sounds: Normal breath sounds.  Musculoskeletal:     Comments: Normal Muscle Bulk and Muscle Testing Reveals:  Upper Extremities: Full ROM and Muscle Strength 5/5 Bilateral AC Joint Tenderness  Thoracic Paraspinal Tenderness: T-1-T-3  Lumbar Hypersensitivity Lower Extremities: Full ROM and Muscle Strength 5/5 Left Lower Extremity Flexion Produces Pain into her Left Hip Arises from chair slowly Narrow Based Gait   Skin:    General: Skin is warm and dry.  Neurological:     Mental Status: She is alert and oriented to person, place, and time.  Psychiatric:        Mood and Affect: Mood normal.        Behavior: Behavior normal.           Assessment & Plan:  1. Lumbago/ Lumbar Spondylosis/LeftLumbar Radiculitis: Continue to Monitor.05/24/2019. Refilled: HYDROcodone 10/325mg  one tablet every8hours as needed #90.  We will  continue the opioid monitoring program, this consists of regular clinic visits, examinations, urine drug screen, pill counts as well as use of [place] Controlled Substance Reporting System. 2. Fibromyalgia. Continue Current exercise Regime.05/24/2019 3. Anxiety and depression:Psychiatry [facility]. Continue Counseling at [facility]. On Xanax,05/24/2019 4. Migraines: On Maxalt. Neurology Following.05/24/2019 5. OSA : Continue exercise regime as tolerated and losing weight.05/24/2019 6. Obesity: Following Healthy Diet Regimen.05/24/2019 7. Status Post Cervical Spinal Fusion: C5C6- C-6- C-7 Anterior Cervical Discectomy Fusion and Allograft Plate:Dr. Lorin Mercy Following.05/24/2019 8. Cervicalgia/ Cervical Radiculitis/ S/P Cervical Spinal Fusion: Continueto MonitorDr. Yates Following.05/24/2019 9. Muscle Spasm: Continue Tizanidine as needed.05/24/2019 10.Hereditary and Idiopathic Peripheral Neuropathy Continue with Tens Unit.05/24/2019 11.Left Greater Trochanteric Bursitis: Continue to Alternate Ice andheat Therapy. Continue to Monitor. 05/24/2019.  20 minutes of face to face patient care time was spent during this visit. All questions were encouraged and answered.  F/U in 1 month

## 2019-05-25 ENCOUNTER — Ambulatory Visit (INDEPENDENT_AMBULATORY_CARE_PROVIDER_SITE_OTHER): Payer: Medicare Other | Admitting: Orthopaedic Surgery

## 2019-05-25 ENCOUNTER — Ambulatory Visit: Payer: Self-pay

## 2019-05-25 ENCOUNTER — Encounter: Payer: Self-pay | Admitting: Orthopaedic Surgery

## 2019-05-25 ENCOUNTER — Ambulatory Visit: Payer: Medicare Other | Admitting: Orthopaedic Surgery

## 2019-05-25 VITALS — BP 159/100 | HR 88 | Ht 63.0 in | Wt 238.0 lb

## 2019-05-25 DIAGNOSIS — M542 Cervicalgia: Secondary | ICD-10-CM

## 2019-05-25 DIAGNOSIS — M7711 Lateral epicondylitis, right elbow: Secondary | ICD-10-CM | POA: Diagnosis not present

## 2019-05-25 MED ORDER — BUPIVACAINE HCL 0.5 % IJ SOLN
1.0000 mL | INTRAMUSCULAR | Status: AC | PRN
  Administered 2019-05-25: 1 mL via INTRA_ARTICULAR

## 2019-05-25 MED ORDER — LIDOCAINE HCL 1 % IJ SOLN
0.5000 mL | INTRAMUSCULAR | Status: AC | PRN
  Administered 2019-05-25: .5 mL

## 2019-05-25 MED ORDER — METHYLPREDNISOLONE ACETATE 40 MG/ML IJ SUSP
40.0000 mg | INTRAMUSCULAR | Status: AC | PRN
  Administered 2019-05-25: 40 mg via INTRA_ARTICULAR

## 2019-05-25 MED FILL — HYDROCODON-APAP 10-325: 10-325 | 30 days supply | Qty: 90 | Fill #0

## 2019-05-25 NOTE — Progress Notes (Signed)
Office Visit Note   Patient: Karina Greer           Date of Birth: Nov 08, 1965           MRN: [id number] Visit Date: 05/25/2019              Requested by: Cyndi Bender, PA-C [address] [place],  [place] [address] PCP: Cyndi Bender, PA-C   Assessment & Plan: Visit Diagnoses:  1. Neck pain   2. Lateral epicondylitis, right elbow     Plan: Lateral epicondyle injected right elbow with reproduction of her pain and good relief postinjection she will continue with her lateral epicondylar splint which she already has at home.  Return if she has persistent symptoms.  Follow-Up Instructions: No follow-ups on file.   Orders:  Orders Placed This Encounter  Procedures  . Medium Joint Inj: R lateral epicondyle  . XR Cervical Spine 2 or 3 views   No orders of the defined types were placed in this encounter.     Procedures: Medium Joint Inj: R lateral epicondyle on 05/25/2019 3:17 PM Indications: pain Details: 22 G 1.5 in needle, anterolateral approach Medications: 1 mL bupivacaine 0.5 %; 40 mg methylPREDNISolone acetate 40 MG/ML; 0.5 mL lidocaine 1 % Outcome: tolerated well, no immediate complications Procedure, treatment alternatives, risks and benefits explained, specific risks discussed. Consent was given by the patient. Immediately prior to procedure a time out was called to verify the correct patient, procedure, equipment, support staff and site/side marked as required. Patient was prepped and draped in the usual sterile fashion.       Clinical Data: No additional findings.   Subjective: Chief Complaint  Patient presents with  . Neck - Pain  . Right Elbow - Pain    HPI 53 year old female returns she had two-level cervical fusion C5-C7 2017.  CT scan 2018 showed good graft incorporation.  She has had problems with electrical type sensation around her ear left side of her neck which she describes as sort of a shocking type sensation sometimes lasted all the time.  She  got some prednisone got some improvement.  We discussed past problems and she did have [facility] spotted fever and then states she had Bell's palsy on the left side about 20 years ago roughly.  She states she is also had problems with pain in her right elbow with recurrent weakness despite use of a brace had previous lateral epicondylar injection that gave her good relief for many months and is requesting a repeat injection today.  Sensation on the left side of her neck and around her ear improved with the prednisone treatment.  She still has some residual symptoms but she states is certainly better than it was.  Review of Systems 14 point update unchanged from 09/21/2018 office visit other than as mentioned HPI.   Objective: Vital Signs: BP (!) 159/100   Pulse 88   Ht 5\' 3"  (1.6 m)   Wt 238 lb (108 kg)   BMI 42.16 kg/m   Physical Exam Constitutional:      Appearance: She is well-developed.  HENT:     Head: Normocephalic.     Right Ear: External ear normal.     Left Ear: External ear normal.  Eyes:     Pupils: Pupils are equal, round, and reactive to light.  Neck:     Thyroid: No thyromegaly.     Trachea: No tracheal deviation.  Cardiovascular:     Rate and Rhythm: Normal  rate.  Pulmonary:     Effort: Pulmonary effort is normal.  Abdominal:     Palpations: Abdomen is soft.  Skin:    General: Skin is warm and dry.  Neurological:     Mental Status: She is alert and oriented to person, place, and time.  Psychiatric:        Behavior: Behavior normal.     Ortho Exam patient exquisite tenderness over the right lateral epicondyle pain with resisted wrist extension radial tunnel palpation is normal upper extremity reflexes are 2+ and symmetrical well-healed cervical incision.  Negative myelopathic gait pattern.  Specialty Comments:  No specialty comments available.  Imaging: No results found.   PMFS History: Patient Active Problem List   Diagnosis Date Noted  . Lateral  epicondylitis, right elbow 05/25/2019  . Atherosclerosis of aorta ([place]) 10/28/2018  . Elevated glucose 10/28/2018  . Mixed hyperlipidemia 10/28/2018  . Hypothyroidism 07/20/2018  . LLQ pain 12/03/2017  . Pre-diabetes 06/03/2017  . Rectal bleeding 06/02/2017  . S/P cervical spinal fusion 03/11/2016  . Bilateral hand pain 11/29/2015  . Elevated C-reactive protein 11/29/2015  . Elevated rheumatoid factor 11/29/2015  . Raynaud's phenomenon without gangrene 11/29/2015  . Bloating 09/12/2014  . Dizziness 08/08/2014  . Peri-menopause 08/08/2014  . Chronic tension-type headache, intractable 07/13/2014  . Gastroesophageal reflux disease without esophagitis 07/12/2014  . Anxiety 04/18/2014  . Chronic arthritis 04/18/2014  . Cystitis 04/18/2014  . Morbid obesity ([place]) 04/18/2014  . Fibromyalgia 04/18/2014  . 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 Arthropathy 04/18/2014  . Cephalalgia 04/04/2014  . Numbness and tingling 04/04/2014  . 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 Nonspecific abnormal findings on radiological and other examination of skull and head 02/23/2013  . Other nonspecific abnormal result of function study of brain and central nervous system 07/01/2012  . Disturbance of skin sensation 07/01/2012  . Migraine without aura 07/01/2012  . Abdominal pain 02/11/2012  . Chronic fatigue syndrome 02/11/2012  . Chronic interstitial cystitis 02/11/2012  . Dyspareunia 02/11/2012  . Dysuria 02/11/2012  . Female stress incontinence 02/11/2012  . Irritable bowel syndrome with diarrhea 02/11/2012  . Nausea and vomiting 02/11/2012  . Nocturia 02/11/2012  . Urge incontinence 02/11/2012  . Urinary urgency 02/11/2012  . Lumbar spondylosis 02/04/2012  . Myalgia and myositis 02/04/2012  . Depression 02/04/2012  . Lumbosacral spondylosis without myelopathy 02/04/2012  . OBESITY, UNSPECIFIED 10/03/2010  . GENERALIZED  ANXIETY DISORDER 10/03/2010  . Chest pain 10/03/2010   Past Medical History:  Diagnosis Date  . Anxiety   . Anxiety disorder   . Arthritis   . Blepharitis   . Chronic low back pain   . Depression   . Dyslipidemia   . Fibromyalgia   . GERD (gastroesophageal reflux disease)   . Headache    hx migraines  . History of kidney stones   . History of panic attacks   . History of renal calculi   . IBS (irritable bowel syndrome)   . Interstitial cystitis   . Lupus ([place])    tested positive for antibodies for lupus. dr to do further tests  . Menorrhagia   . OSA on CPAP    not used cpap for several weeks  . Pneumonia    hx  . PONV (postoperative nausea and vomiting)   . RLS (restless legs syndrome)   . Rosacea   . Seasonal asthma   . SI (sacroiliac) joint dysfunction   . SUI (  stress urinary incontinence, female)   . UTI (lower urinary tract infection)    hx  . 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 Hypertension Mother   . Diabetes Father   . Brain cancer Father   . Fibromyalgia Sister   . Suicidality Sister   . Hypertension Other        Cancer, Cerebrovascular disease run on mother side of family    Past Surgical History:  Procedure Laterality Date  . ACHILLES TENDON SURGERY Right 3/16  . ANTERIOR CERVICAL DECOMP/DISCECTOMY FUSION N/A 03/11/2016   Procedure: C5-6, C6-7 Anterior Cervical Discectomy and Fusion, Allograft, Plate;  Surgeon: Marybelle Killings, MD;  Location: [facility];  Service: Orthopedics;  Laterality: N/A;  . CHOLECYSTECTOMY  2014  . COLONOSCOPY WITH PROPOFOL N/A 08/25/2017   Procedure: COLONOSCOPY WITH PROPOFOL;  Surgeon: Manya Silvas, MD;  [facility] ENDOSCOPY;  Service: Endoscopy;  Laterality: N/A;  . CYSTO WITH HYDRODISTENSION N/A 01/26/2014   Procedure: CYSTOSCOPY/HYDRODISTENSION;  Surgeon: Bernestine Amass, MD;  Location: [facility];  Service: Urology;  Laterality: N/A;  . CYSTO/   URETHRAL DILATION/  HYDRODISTENTION/   INSTILLATION THERAPY  07-16-2010//   12-30-2007//   10-27-2006  . EXERCISE TOLERENCE TEST  10-12-2010   NEGATIVE  ADEQUATE ETT/  NO ISCHEMIA OR EVIDENCE HIGH GRADE OBSTRUCTIVE CAD/  NO FURTHER TEST NEEDED  . [place] ENDOMETRIAL ABLATION  2014  . LAPAROSCOPIC OVARIAN CYST BX  2005   AND  URETEROSCOPIC LASER LITHO  STONE EXTRACTION  . SHOULDER OPEN ROTATOR CUFF REPAIR Right 2004  . TONSILLECTOMY    . TRANSTHORACIC ECHOCARDIOGRAM  06-07-2006   normal study/  ef 60-65%  . TUBAL LIGATION Bilateral 1995   Social History   Occupational History  . Occupation: Merchandiser, retail: UNEMPLOYED    Comment: Disability  Tobacco Use  . Smoking status: Never Smoker  . Smokeless tobacco: Never Used  Substance and Sexual Activity  . Alcohol use: No  . Drug use: No  . Sexual activity: Not on file

## 2019-05-26 ENCOUNTER — Ambulatory Visit (INDEPENDENT_AMBULATORY_CARE_PROVIDER_SITE_OTHER): Payer: Medicare Other | Admitting: Internal Medicine

## 2019-05-26 ENCOUNTER — Encounter: Payer: Self-pay | Admitting: Internal Medicine

## 2019-05-26 ENCOUNTER — Other Ambulatory Visit: Payer: Self-pay

## 2019-05-26 DIAGNOSIS — J301 Allergic rhinitis due to pollen: Secondary | ICD-10-CM | POA: Diagnosis not present

## 2019-05-26 DIAGNOSIS — K219 Gastro-esophageal reflux disease without esophagitis: Secondary | ICD-10-CM | POA: Diagnosis not present

## 2019-05-26 DIAGNOSIS — J45909 Unspecified asthma, uncomplicated: Secondary | ICD-10-CM

## 2019-05-26 DIAGNOSIS — Z9989 Dependence on other enabling machines and devices: Secondary | ICD-10-CM

## 2019-05-26 DIAGNOSIS — G4733 Obstructive sleep apnea (adult) (pediatric): Secondary | ICD-10-CM

## 2019-05-26 NOTE — Progress Notes (Signed)
[facility] [place], [place] [address]  Internal MEDICINE  Telephone Visit  Patient Name: Karina Greer  [id number]  [id number]  Date of Service: 05/26/2019  I connected with the patient at 1001 by telephone and verified the patients identity using two identifiers.   I discussed the limitations, risks, security and privacy concerns of performing an evaluation and management service by telephone and the availability of in person appointments. I also discussed with the patient that there may be a patient responsible charge related to the service.  The patient expressed understanding and agrees to proceed.    Chief Complaint  Patient presents with  . Telephone Screen  . Allergic Rhinitis     6 month follow up   . Telephone Assessment    HPI  Pt seen via telephone for 6 month follow up on allergies.  She reports overall her allergies have been controlled. She is taking Allegra currently with no difficulty. She reports she is not wearing her cpap at this time but plans to start back soon.  She is using her inhalers and denies any issues currently.     Current Medication: Outpatient Encounter Medications as of 05/26/2019  Medication Sig  . albuterol (PROVENTIL) (2.5 MG/3ML) 0.083% nebulizer solution INHALE 1 VIAL VIA NEBULIZER EVERY 6 HOURS AS NEEDED  . ALPRAZolam (XANAX) 0.25 MG tablet Take 0.25 mg by mouth 2 (two) times daily as needed for anxiety.   [name] azelastine (ASTELIN) 0.1 % nasal spray USE 2 SPRAYS INTO EACH NOSTRIL TWICE A DAY  . b complex vitamins tablet Take 1 tablet by mouth daily.  [name] DEXILANT 30 MG capsule Take 1 capsule by mouth 2 (two) times daily.  [name] dicyclomine (BENTYL) 20 MG tablet Take 20 mg by mouth every 6 (six) hours.  [name] EPIPEN 2-PAK 0.3 MG/0.3ML SOAJ injection Use as directed  . FLOVENT HFA 110 MCG/ACT inhaler INHALE 1 PUFF BY MOUTH TWICE A DAY  . HYDROcodone-acetaminophen (NORCO) 10-325 MG tablet TAKE 1 TABLET BY MOUTH EVERY 8 HOURS AS  NEEDED FOR PAIN  . hydrOXYzine (ATARAX/VISTARIL) 10 MG tablet Take 1 tablet (10 mg total) by mouth 3 (three) times daily as needed.  . meclizine (ANTIVERT) 25 MG tablet Take 1 tablet by mouth 3 (three) times daily as needed.  . montelukast (SINGULAIR) 10 MG tablet Take 10 mg by mouth every morning.  . ondansetron (ZOFRAN-ODT) 4 MG disintegrating tablet Take 1 tablet by mouth 3 (three) times daily as needed.  . penicillin v potassium (VEETID) 500 MG tablet Take 500 mg by mouth 4 (four) times daily.  . predniSONE (DELTASONE) 10 MG tablet 2 tab twice daily for 3 days, 1 tab twice daily for 3 days, 1 tab daily for 3 days and off.  [name] PRESCRIPTION MEDICATION every 14 (fourteen) days. 2 ALLERGY INJECTIONS EVERY 2 WKS  . PROAIR HFA 108 (90 Base) MCG/ACT inhaler INHALE 2 PUFFS BY MOUTH EVERY 4 TO 6 HOURS AS NEEDED  . sucralfate (CARAFATE) 1 g tablet Take 1 g by mouth 4 (four) times daily -  with meals and at bedtime.  [name] tiZANidine (ZANAFLEX) 2 MG tablet TAKE 1 TABLET (2 MG TOTAL) BY MOUTH EVERY 8 (EIGHT) HOURS AS NEEDED.  . Vitamin D, Ergocalciferol, (DRISDOL) 50000 units CAPS capsule Take 50,000 Units by mouth every 7 (seven) days. THURSDAYS   No facility-administered encounter medications on file as of 05/26/2019.     Surgical History: Past Surgical History:  Procedure Laterality Date  . ACHILLES  TENDON SURGERY Right 3/16  . ANTERIOR CERVICAL DECOMP/DISCECTOMY FUSION N/A 03/11/2016   Procedure: C5-6, C6-7 Anterior Cervical Discectomy and Fusion, Allograft, Plate;  Surgeon: Marybelle Killings, MD;  Location: [place];  Service: Orthopedics;  Laterality: N/A;  . CHOLECYSTECTOMY  2014  . COLONOSCOPY WITH PROPOFOL N/A 08/25/2017   Procedure: COLONOSCOPY WITH PROPOFOL;  Surgeon: Manya Silvas, MD;  [facility] ENDOSCOPY;  Service: Endoscopy;  Laterality: N/A;  . CYSTO WITH HYDRODISTENSION N/A 01/26/2014   Procedure: CYSTOSCOPY/HYDRODISTENSION;  Surgeon: Bernestine Amass, MD;  Location: [facility];  Service: Urology;  Laterality: N/A;  . CYSTO/  URETHRAL DILATION/  HYDRODISTENTION/   INSTILLATION THERAPY  07-16-2010//   12-30-2007//   10-27-2006  . EXERCISE TOLERENCE TEST  10-12-2010   NEGATIVE  ADEQUATE ETT/  NO ISCHEMIA OR EVIDENCE HIGH GRADE OBSTRUCTIVE CAD/  NO FURTHER TEST NEEDED  . [place] ENDOMETRIAL ABLATION  2014  . LAPAROSCOPIC OVARIAN CYST BX  2005   AND  URETEROSCOPIC LASER LITHO  STONE EXTRACTION  . SHOULDER OPEN ROTATOR CUFF REPAIR Right 2004  . TONSILLECTOMY    . TRANSTHORACIC ECHOCARDIOGRAM  06-07-2006   normal study/  ef 60-65%  . TUBAL LIGATION Bilateral 1995    Medical History: Past Medical History:  Diagnosis Date  . Anxiety   . Anxiety disorder   . Arthritis   . Blepharitis   . Chronic low back pain   . Depression   . Dyslipidemia   . Fibromyalgia   . GERD (gastroesophageal reflux disease)   . Headache    hx migraines  . History of kidney stones   . History of panic attacks   . History of renal calculi   . IBS (irritable bowel syndrome)   . Interstitial cystitis   . Lupus ([place])    tested positive for antibodies for lupus. dr to do further tests  . Menorrhagia   . OSA on CPAP    not used cpap for several weeks  . Pneumonia    hx  . PONV (postoperative nausea and vomiting)   . RLS (restless legs syndrome)   . Rosacea   . Seasonal asthma   . SI (sacroiliac) joint dysfunction   . SUI (stress urinary incontinence, female)   . UTI (lower urinary tract infection)    hx  . 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 02/23/2013    Family History: Family History  Problem Relation Age of Onset  . Hypertension Mother   . Diabetes Father   . Brain cancer Father   . Fibromyalgia Sister   . Suicidality Sister   . Hypertension Other        Cancer, Cerebrovascular disease run on mother side of family    Social History   Socioeconomic History  . Marital status: Divorced    Spouse name: Not on file  . Number of children:  3  . Years of education: HS  . Highest education level: 12th grade  Occupational History  . Occupation: Merchandiser, retail: UNEMPLOYED    Comment: Disability  Social Needs  . Financial resource strain: Not on file  . Food insecurity    Worry: Not on file    Inability: Not on file  . Transportation needs    Medical: Not on file    Non-medical: Not on file  Tobacco Use  . Smoking status: Never Smoker  . Smokeless tobacco: Never Used  Substance and Sexual Activity  . Alcohol use: No  .  Drug use: No  . Sexual activity: Not on file  Lifestyle  . Physical activity    Days per week: Not on file    Minutes per session: Not on file  . Stress: Not on file  Relationships  . Social Herbalist on phone: Not on file    Gets together: Not on file    Attends religious service: Not on file    Active member of club or organization: Not on file    Attends meetings of clubs or organizations: Not on file    Relationship status: Not on file  . Intimate partner violence    Fear of current or ex partner: Not on file    Emotionally abused: Not on file    Physically abused: Not on file    Forced sexual activity: Not on file  Other Topics Concern  . Not on file  Social History Narrative   Patient is right-handed. She avoids caffeine. She has recently been using the treadmill.      Review of Systems  Constitutional: Negative for chills, fatigue and unexpected weight change.  HENT: Negative for congestion, rhinorrhea, sneezing and sore throat.   Eyes: Negative for photophobia, pain and redness.  Respiratory: Negative for cough, chest tightness and shortness of breath.   Cardiovascular: Negative for chest pain and palpitations.  Gastrointestinal: Negative for abdominal pain, constipation, diarrhea, nausea and vomiting.  Endocrine: Negative.   Genitourinary: Negative for dysuria and frequency.  Musculoskeletal: Negative for arthralgias, back pain, joint swelling and neck pain.   Skin: Negative for rash.  Allergic/Immunologic: Negative.   Neurological: Negative for tremors and numbness.  Hematological: Negative for adenopathy. Does not bruise/bleed easily.  Psychiatric/Behavioral: Negative for behavioral problems and sleep disturbance. The patient is not nervous/anxious.     Vital Signs: There were no vitals taken for this visit.   Observation/Objective:  Well sounding, speaking in full sentences.    Assessment/Plan: 1. Seasonal allergic rhinitis due to pollen Stable, continue present medications. Continue allergy shots when feeling better.    2. Moderate asthma, unspecified whether complicated, unspecified whether persistent Continue to use inhalers as directed.   3. OSA on CPAP Discussed cpap compliance, and encouraged regular use.   4. Gastroesophageal reflux disease without esophagitis Continue current therapy.   5. Morbid obesity ([place]) Obesity Counseling: Risk Assessment: An assessment of behavioral risk factors was made today and includes lack of exercise sedentary lifestyle, lack of portion control and poor dietary habits.  Risk Modification Advice: She was counseled on portion control guidelines. Restricting daily caloric intake to. . The detrimental long term effects of obesity on her health and ongoing poor compliance was also discussed with the patient.    General Counseling: diamonique mollard understanding of the findings of today's phone visit and agrees with plan of treatment. I have discussed any further diagnostic evaluation that may be needed or ordered today. We also reviewed her medications today. she has been encouraged to call the office with any questions or concerns that should arise related to todays visit.    No orders of the defined types were placed in this encounter.   No orders of the defined types were placed in this encounter.   Time spent: [place] AGNP-C Internal medicine

## 2019-05-31 ENCOUNTER — Encounter: Payer: Self-pay | Admitting: Registered Nurse

## 2019-06-02 ENCOUNTER — Ambulatory Visit (INDEPENDENT_AMBULATORY_CARE_PROVIDER_SITE_OTHER): Payer: Medicare Other

## 2019-06-02 ENCOUNTER — Other Ambulatory Visit: Payer: Self-pay

## 2019-06-02 DIAGNOSIS — J301 Allergic rhinitis due to pollen: Secondary | ICD-10-CM | POA: Diagnosis not present

## 2019-06-03 ENCOUNTER — Other Ambulatory Visit: Payer: Self-pay | Admitting: Nurse Practitioner

## 2019-06-03 DIAGNOSIS — K2930 Chronic superficial gastritis without bleeding: Secondary | ICD-10-CM | POA: Diagnosis not present

## 2019-06-03 DIAGNOSIS — R110 Nausea: Secondary | ICD-10-CM | POA: Diagnosis not present

## 2019-06-03 DIAGNOSIS — Z6841 Body Mass Index (BMI) 40.0 and over, adult: Secondary | ICD-10-CM | POA: Diagnosis not present

## 2019-06-03 DIAGNOSIS — Z23 Encounter for immunization: Secondary | ICD-10-CM | POA: Diagnosis not present

## 2019-06-08 ENCOUNTER — Ambulatory Visit: Payer: Medicare Other | Admitting: Orthopaedic Surgery

## 2019-06-09 DIAGNOSIS — J019 Acute sinusitis, unspecified: Secondary | ICD-10-CM | POA: Diagnosis not present

## 2019-06-09 DIAGNOSIS — H1032 Unspecified acute conjunctivitis, left eye: Secondary | ICD-10-CM | POA: Diagnosis not present

## 2019-06-15 ENCOUNTER — Ambulatory Visit: Payer: Medicare Other

## 2019-06-16 ENCOUNTER — Other Ambulatory Visit: Payer: Self-pay

## 2019-06-16 ENCOUNTER — Ambulatory Visit (INDEPENDENT_AMBULATORY_CARE_PROVIDER_SITE_OTHER): Payer: Medicare Other

## 2019-06-16 DIAGNOSIS — J301 Allergic rhinitis due to pollen: Secondary | ICD-10-CM | POA: Diagnosis not present

## 2019-06-17 ENCOUNTER — Other Ambulatory Visit: Payer: Self-pay

## 2019-06-17 ENCOUNTER — Ambulatory Visit
Admission: RE | Admit: 2019-06-17 | Discharge: 2019-06-17 | Disposition: A | Discharge status: Home / Self Care | Payer: Medicare Other | Source: Ambulatory Visit | Attending: Nurse Practitioner | Admitting: Nurse Practitioner

## 2019-06-17 DIAGNOSIS — R110 Nausea: Secondary | ICD-10-CM | POA: Insufficient documentation

## 2019-06-17 DIAGNOSIS — R112 Nausea with vomiting, unspecified: Secondary | ICD-10-CM | POA: Diagnosis not present

## 2019-06-17 MED ORDER — TECHNETIUM TC 99M SULFUR COLLOID
2.0000 | Freq: Once | INTRAVENOUS | Status: AC | PRN
  Administered 2019-06-17: 2.6 via ORAL

## 2019-06-18 ENCOUNTER — Other Ambulatory Visit: Payer: Self-pay | Admitting: Adult Health

## 2019-06-21 ENCOUNTER — Other Ambulatory Visit: Payer: Self-pay

## 2019-06-21 ENCOUNTER — Encounter: Payer: Self-pay | Admitting: Registered Nurse

## 2019-06-21 ENCOUNTER — Encounter: Payer: Medicare Other | Attending: Physical Medicine & Rehabilitation | Admitting: Registered Nurse

## 2019-06-21 VITALS — Temp 97.5°F | Ht 63.0 in | Wt 240.0 lb

## 2019-06-21 DIAGNOSIS — Z5181 Encounter for therapeutic drug level monitoring: Secondary | ICD-10-CM

## 2019-06-21 DIAGNOSIS — M47816 Spondylosis without myelopathy or radiculopathy, lumbar region: Secondary | ICD-10-CM

## 2019-06-21 DIAGNOSIS — M7062 Trochanteric bursitis, left hip: Secondary | ICD-10-CM

## 2019-06-21 DIAGNOSIS — M542 Cervicalgia: Secondary | ICD-10-CM | POA: Diagnosis not present

## 2019-06-21 DIAGNOSIS — M7061 Trochanteric bursitis, right hip: Secondary | ICD-10-CM

## 2019-06-21 DIAGNOSIS — R200 Anesthesia of skin: Secondary | ICD-10-CM | POA: Insufficient documentation

## 2019-06-21 DIAGNOSIS — M25512 Pain in left shoulder: Secondary | ICD-10-CM | POA: Diagnosis not present

## 2019-06-21 DIAGNOSIS — Z981 Arthrodesis status: Secondary | ICD-10-CM

## 2019-06-21 DIAGNOSIS — G894 Chronic pain syndrome: Secondary | ICD-10-CM

## 2019-06-21 DIAGNOSIS — Z79899 Other long term (current) drug therapy: Secondary | ICD-10-CM

## 2019-06-21 DIAGNOSIS — R202 Paresthesia of skin: Secondary | ICD-10-CM | POA: Insufficient documentation

## 2019-06-21 DIAGNOSIS — M797 Fibromyalgia: Secondary | ICD-10-CM

## 2019-06-21 MED ORDER — HYDROCODONE-ACETAMINOPHEN 10-325 MG PO TABS: ORAL_TABLET | ORAL | 0 refills | Status: DC

## 2019-06-21 NOTE — Progress Notes (Signed)
Subjective:    Patient ID: Karina Greer, female    DOB: 1965/10/02, 53 y.o.   MRN: [id number]  HPI: Karina Greer is a 53 y.o. female who returns for follow up appointment for chronic pain and medication refill. She states her  pain is located in her left shoulder, lower back radiating into her bilateral hips. She reports her left shoulder pain began three weeks ago she denies falls, states the pain has increased in intensity X-ray ordered, she verbalizes understanding. She rates her pain 7. Her  current exercise regime is walking in the park 3 days a week averaging 4000- 8000 steps, walking on her treadmill two days a week for 15 minutes  and performing stretching exercises.  Vitals: 134/93 P- 70- Oxygen Saturation 98%  Ms. Bottger Morphine equivalent is 30.00  MME.  Last UDS was Performed on 02/22/2019, it was consistent.   Pain Inventory Average Pain 9 Pain Right Now 7 My pain is constant, dull, tingling and aching  In the last 24 hours, has pain interfered with the following? General activity 7 Relation with others 8 Enjoyment of life 8 What TIME of day is your pain at its worst? all Sleep (in general) Fair  Pain is worse with: walking, bending, sitting, inactivity, standing and some activites Pain improves with: rest, heat/ice, therapy/exercise, pacing activities, medication and TENS Relief from Meds: 7  Mobility walk without assistance how many minutes can you walk? 30 ability to climb steps?  yes do you drive?  yes  Function I need assistance with the following:  meal prep, household duties and shopping  Neuro/Psych dizziness anxiety  Prior Studies Any changes since last visit?  no  Physicians involved in your care Any changes since last visit?  no   Family History  Problem Relation Age of Onset  . Hypertension Mother   . Diabetes Father   . Brain cancer Father   . Fibromyalgia Sister   . Suicidality Sister   . Hypertension Other        Cancer,  Cerebrovascular disease run on mother side of family   Social History   Socioeconomic History  . Marital status: Divorced    Spouse name: Not on file  . Number of children: 3  . Years of education: HS  . Highest education level: 12th grade  Occupational History  . Occupation: Merchandiser, retail: UNEMPLOYED    Comment: Disability  Social Needs  . Financial resource strain: Not on file  . Food insecurity    Worry: Not on file    Inability: Not on file  . Transportation needs    Medical: Not on file    Non-medical: Not on file  Tobacco Use  . Smoking status: Never Smoker  . Smokeless tobacco: Never Used  Substance and Sexual Activity  . Alcohol use: No  . Drug use: No  . Sexual activity: Not on file  Lifestyle  . Physical activity    Days per week: Not on file    Minutes per session: Not on file  . Stress: Not on file  Relationships  . Social Herbalist on phone: Not on file    Gets together: Not on file    Attends religious service: Not on file    Active member of club or organization: Not on file    Attends meetings of clubs or organizations: Not on file    Relationship status: Not on file  Other Topics Concern  .  Not on file  Social History Narrative   Patient is right-handed. She avoids caffeine. She has recently been using the treadmill.   Past Surgical History:  Procedure Laterality Date  . ACHILLES TENDON SURGERY Right 3/16  . ANTERIOR CERVICAL DECOMP/DISCECTOMY FUSION N/A 03/11/2016   Procedure: C5-6, C6-7 Anterior Cervical Discectomy and Fusion, Allograft, Plate;  Surgeon: Marybelle Killings, MD;  Location: [place];  Service: Orthopedics;  Laterality: N/A;  . CHOLECYSTECTOMY  2014  . COLONOSCOPY WITH PROPOFOL N/A 08/25/2017   Procedure: COLONOSCOPY WITH PROPOFOL;  Surgeon: Manya Silvas, MD;  Location: [facility] ENDOSCOPY;  Service: Endoscopy;  Laterality: N/A;  . CYSTO WITH HYDRODISTENSION N/A 01/26/2014   Procedure: CYSTOSCOPY/HYDRODISTENSION;   Surgeon: Bernestine Amass, MD;  Location[facility];  Service: Urology;  Laterality: N/A;  . CYSTO/  URETHRAL DILATION/  HYDRODISTENTION/   INSTILLATION THERAPY  07-16-2010//   12-30-2007//   10-27-2006  . EXERCISE TOLERENCE TEST  10-12-2010   NEGATIVE  ADEQUATE ETT/  NO ISCHEMIA OR EVIDENCE HIGH GRADE OBSTRUCTIVE CAD/  NO FURTHER TEST NEEDED  . [place] ENDOMETRIAL ABLATION  2014  . LAPAROSCOPIC OVARIAN CYST BX  2005   AND  URETEROSCOPIC LASER LITHO  STONE EXTRACTION  . SHOULDER OPEN ROTATOR CUFF REPAIR Right 2004  . TONSILLECTOMY    . TRANSTHORACIC ECHOCARDIOGRAM  06-07-2006   normal study/  ef 60-65%  . TUBAL LIGATION Bilateral 1995   Past Medical History:  Diagnosis Date  . Anxiety   . Anxiety disorder   . Arthritis   . Blepharitis   . Chronic low back pain   . Depression   . Dyslipidemia   . Fibromyalgia   . GERD (gastroesophageal reflux disease)   . Headache    hx migraines  . History of kidney stones   . History of panic attacks   . History of renal calculi   . IBS (irritable bowel syndrome)   . Interstitial cystitis   . Lupus ([place])    tested positive for antibodies for lupus. dr to do further tests  . Menorrhagia   . OSA on CPAP    not used cpap for several weeks  . Pneumonia    hx  . PONV (postoperative nausea and vomiting)   . RLS (restless legs syndrome)   . Rosacea   . Seasonal asthma   . SI (sacroiliac) joint dysfunction   . SUI (stress urinary incontinence, female)   . UTI (lower urinary tract infection)    hx  . 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 02/23/2013   There were no vitals taken for this visit.  Opioid Risk Score:   Fall Risk Score:  `1  Depression screen PHQ 2/9  Depression screen [facility] 2/9 08/12/2018 07/20/2018 06/19/2018 04/20/2018 01/29/2018 01/28/2018 12/15/2017  Decreased Interest 0 1 1 0 0 1 2  Down, Depressed, Hopeless 0 1 1 0 0 1 2  PHQ - 2 Score 0 2 2 0 0 2 4  Altered sleeping - - - - - - -  Tired,  decreased energy - - - - - - -  Change in appetite - - - - - - -  Feeling bad or failure about yourself  - - - - - - -  Trouble concentrating - - - - - - -  Moving slowly or fidgety/restless - - - - - - -  Suicidal thoughts - - - - - - -  PHQ-9 Score - - - - - - -  Difficult doing work/chores - - - - - - -  Some recent data might be hidden     Review of Systems  Constitutional: Negative.   HENT: Negative.   Eyes: Negative.   Respiratory: Negative.   Cardiovascular: Negative.   Gastrointestinal: Negative.   Endocrine: Negative.   Genitourinary: Negative.   Musculoskeletal: Negative.   Skin: Negative.   Allergic/Immunologic: Negative.   Neurological: Positive for dizziness.  Hematological: Negative.   Psychiatric/Behavioral: The patient is nervous/anxious.   All other systems reviewed and are negative.      Objective:   Physical Exam Vitals signs and nursing note reviewed.  Constitutional:      Appearance: Normal appearance.  Neck:     Musculoskeletal: Neck supple.  Cardiovascular:     Rate and Rhythm: Normal rate and regular rhythm.     Pulses: Normal pulses.     Heart sounds: Normal heart sounds.  Pulmonary:     Effort: Pulmonary effort is normal.     Breath sounds: Normal breath sounds.  Musculoskeletal:     Comments: Normal Muscle Bulk and Muscle Testing Reveals:  Upper Extremities:Full  ROM and Muscle Strength 5/5 Left AC Joint Tenderness  Thoracic Paraspinal Tenderness: T-1-T-3 Mainly Left Side Lumbar Paraspinal Tenderness: L-3-L-5 Lower Extremities: Full ROM and Muscle Strength 5/5 Arises from Chair with ease Narrow Based  Gait   Skin:    General: Skin is warm and dry.  Neurological:     Mental Status: She is alert and oriented to person, place, and time.  Psychiatric:        Mood and Affect: Mood normal.        Behavior: Behavior normal.           Assessment & Plan:  1. Lumbago/ Lumbar Spondylosis/LeftLumbar Radiculitis: Continue to  Monitor.06/21/2019. Refilled: HYDROcodone 10/325mg  one tablet every8hours as needed #90.  We will continue the opioid monitoring program, this consists of regular clinic visits, examinations, urine drug screen, pill counts as well as use of [place] Controlled Substance Reporting System. 2. Fibromyalgia. Continue Current exercise Regime.06/21/2019 3. Anxiety and depression:Psychiatry [facility]. Continue Counseling at [facility]. On Xanax,06/21/2019 4. Migraines: On Maxalt. Neurology Following.06/21/2019 5. OSA : Continue exercise regime as tolerated and losing weight.06/21/2019 6. Obesity: Following Healthy Diet Regimen.06/21/2019 7. Status Post Cervical Spinal Fusion: C5C6- C-6- C-7 Anterior Cervical Discectomy Fusion and Allograft Plate:Dr. Lorin Mercy Following.06/21/2019 8. Cervicalgia/ Cervical Radiculitis/ S/P Cervical Spinal Fusion: Continueto MonitorDr. Yates Following.06/21/2019 9. Muscle Spasm: Continue Tizanidine as needed.06/21/2019 10.Hereditary and Idiopathic Peripheral Neuropathy Continue with Tens Unit.06/21/2019 11. Bilateral Greater Trochanteric Bursitis: Continue to Alternate Ice andheat Therapy. Continue to Monitor. 06/21/2019.  22minutes of face to face patient care time was spent during this visit. All questions were encouraged and answered.  F/U in 1 month

## 2019-06-22 MED FILL — HYDROCODON-APAP 10-325: 10-325 | 30 days supply | Qty: 90 | Fill #0

## 2019-06-30 ENCOUNTER — Ambulatory Visit (INDEPENDENT_AMBULATORY_CARE_PROVIDER_SITE_OTHER): Payer: Medicare Other | Admitting: Family Medicine

## 2019-06-30 ENCOUNTER — Ambulatory Visit (INDEPENDENT_AMBULATORY_CARE_PROVIDER_SITE_OTHER): Payer: Medicare Other

## 2019-06-30 ENCOUNTER — Encounter: Payer: Self-pay | Admitting: Family Medicine

## 2019-06-30 ENCOUNTER — Other Ambulatory Visit: Payer: Self-pay

## 2019-06-30 DIAGNOSIS — J301 Allergic rhinitis due to pollen: Secondary | ICD-10-CM

## 2019-06-30 DIAGNOSIS — M25512 Pain in left shoulder: Secondary | ICD-10-CM | POA: Diagnosis not present

## 2019-06-30 NOTE — Progress Notes (Signed)
Office Visit Note   Patient: Karina Greer           Date of Birth: February 28, 1966           MRN: [id number] Visit Date: 06/30/2019 Requested by: Cyndi Bender, PA-C [address] [place],  [place] [address] PCP: Cyndi Bender, PA-C  Subjective: Chief Complaint  Patient presents with  . Left Shoulder - Pain    Pain x 4 weeks. NKI. Decreased ROM. Pain in whole upper arm. Wakes her from sleep. Tried ice, heat and muscle rub on the area.    HPI: She is here with left shoulder pain.  Symptoms started about 4 weeks ago, no injury.  She thought she had slept on her arm wrong.  Pain on the lateral aspect radiating toward the elbow.  She has a history of neck problems but recent MRI scan did not show any definite nerve impingement.   ROS: No fevers or chills.  All other systems were reviewed and are negative.  Objective: Vital Signs: There were no vitals taken for this visit.  Physical Exam:  General:  Alert and oriented, in no acute distress. Pulm:  Breathing unlabored. Psy:  Normal mood, congruent affect. Skin: No rash or erythema. Left shoulder: Full active range of motion.  Pain with empty can test.  Rotator cuff strength is 5 out of 5.  Tender in the lateral subacromial space.  Imaging: None today.  Assessment & Plan: 1.  Left shoulder impingement, subacromial bursitis. -Discussed options with her and elected to inject the subacromial space.  Follow-up as needed.  Physical therapy if symptoms do not resolve.     Procedures: Left shoulder subacromial injection: After sterile prep with Betadine, injected 5 cc 1% lidocaine without epinephrine and 40 mg methylprednisolone from lateral approach into the subdeltoid/subacromial bursa using ultrasound to guide needle placement.    PMFS History: Patient Active Problem List   Diagnosis Date Noted  . Lateral epicondylitis, right elbow 05/25/2019  . Atherosclerosis of aorta ([place]) 10/28/2018  . Elevated glucose 10/28/2018  . Mixed  hyperlipidemia 10/28/2018  . Hypothyroidism 07/20/2018  . LLQ pain 12/03/2017  . Pre-diabetes 06/03/2017  . Rectal bleeding 06/02/2017  . S/P cervical spinal fusion 03/11/2016  . Bilateral hand pain 11/29/2015  . Elevated C-reactive protein 11/29/2015  . Elevated rheumatoid factor 11/29/2015  . Raynaud's phenomenon without gangrene 11/29/2015  . Bloating 09/12/2014  . Dizziness 08/08/2014  . Peri-menopause 08/08/2014  . Chronic tension-type headache, intractable 07/13/2014  . Gastroesophageal reflux disease without esophagitis 07/12/2014  . Anxiety 04/18/2014  . Chronic arthritis 04/18/2014  . Cystitis 04/18/2014  . Morbid obesity ([place]) 04/18/2014  . Fibromyalgia 04/18/2014  . 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 Arthropathy 04/18/2014  . Cephalalgia 04/04/2014  . Numbness and tingling 04/04/2014  . 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 Nonspecific abnormal findings on radiological and other examination of skull and head 02/23/2013  . Other nonspecific abnormal result of function study of brain and central nervous system 07/01/2012  . Disturbance of skin sensation 07/01/2012  . Migraine without aura 07/01/2012  . Abdominal pain 02/11/2012  . Chronic fatigue syndrome 02/11/2012  . Chronic interstitial cystitis 02/11/2012  . Dyspareunia 02/11/2012  . Dysuria 02/11/2012  . Female stress incontinence 02/11/2012  . Irritable bowel syndrome with diarrhea 02/11/2012  . Nausea and vomiting 02/11/2012  . Nocturia 02/11/2012  . Urge incontinence 02/11/2012  . Urinary urgency 02/11/2012  . Lumbar spondylosis  02/04/2012  . Myalgia and myositis 02/04/2012  . Depression 02/04/2012  . Lumbosacral spondylosis without myelopathy 02/04/2012  . OBESITY, UNSPECIFIED 10/03/2010  . GENERALIZED ANXIETY DISORDER 10/03/2010  . Chest pain 10/03/2010   Past Medical History:  Diagnosis Date  . Anxiety   . Anxiety  disorder   . Arthritis   . Blepharitis   . Chronic low back pain   . Depression   . Dyslipidemia   . Fibromyalgia   . GERD (gastroesophageal reflux disease)   . Headache    hx migraines  . History of kidney stones   . History of panic attacks   . History of renal calculi   . IBS (irritable bowel syndrome)   . Interstitial cystitis   . Lupus ([place])    tested positive for antibodies for lupus. dr to do further tests  . Menorrhagia   . OSA on CPAP    not used cpap for several weeks  . Pneumonia    hx  . PONV (postoperative nausea and vomiting)   . RLS (restless legs syndrome)   . Rosacea   . Seasonal asthma   . SI (sacroiliac) joint dysfunction   . SUI (stress urinary incontinence, female)   . UTI (lower urinary tract infection)    hx  . 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 Hypertension Mother   . Diabetes Father   . Brain cancer Father   . Fibromyalgia Sister   . Suicidality Sister   . Hypertension Other        Cancer, Cerebrovascular disease run on mother side of family    Past Surgical History:  Procedure Laterality Date  . ACHILLES TENDON SURGERY Right 3/16  . ANTERIOR CERVICAL DECOMP/DISCECTOMY FUSION N/A 03/11/2016   Procedure: C5-6, C6-7 Anterior Cervical Discectomy and Fusion, Allograft, Plate;  Surgeon: Marybelle Killings, MD;  Location: [facility];  Service: Orthopedics;  Laterality: N/A;  . CHOLECYSTECTOMY  2014  . COLONOSCOPY WITH PROPOFOL N/A 08/25/2017   Procedure: COLONOSCOPY WITH PROPOFOL;  Surgeon: Manya Silvas, MD;  [facility];  Service: Endoscopy;  Laterality: N/A;  . CYSTO WITH HYDRODISTENSION N/A 01/26/2014   Procedure: CYSTOSCOPY/HYDRODISTENSION;  Surgeon: Bernestine Amass, MD;  Location: [facility];  Service: Urology;  Laterality: N/A;  . CYSTO/  URETHRAL DILATION/  HYDRODISTENTION/   INSTILLATION THERAPY  07-16-2010//   12-30-2007//   10-27-2006  . EXERCISE  TOLERENCE TEST  10-12-2010   NEGATIVE  ADEQUATE ETT/  NO ISCHEMIA OR EVIDENCE HIGH GRADE OBSTRUCTIVE CAD/  NO FURTHER TEST NEEDED  . [place] ENDOMETRIAL ABLATION  2014  . LAPAROSCOPIC OVARIAN CYST BX  2005   AND  URETEROSCOPIC LASER LITHO  STONE EXTRACTION  . SHOULDER OPEN ROTATOR CUFF REPAIR Right 2004  . TONSILLECTOMY    . TRANSTHORACIC ECHOCARDIOGRAM  06-07-2006   normal study/  ef 60-65%  . TUBAL LIGATION Bilateral 1995   Social History   Occupational History  . Occupation: Merchandiser, retail: UNEMPLOYED    Comment: Disability  Tobacco Use  . Smoking status: Never Smoker  . Smokeless tobacco: Never Used  Substance and Sexual Activity  . Alcohol use: No  . Drug use: No  . Sexual activity: Not on file

## 2019-07-14 ENCOUNTER — Ambulatory Visit (INDEPENDENT_AMBULATORY_CARE_PROVIDER_SITE_OTHER): Payer: Medicare Other

## 2019-07-14 ENCOUNTER — Other Ambulatory Visit: Payer: Self-pay

## 2019-07-14 DIAGNOSIS — J301 Allergic rhinitis due to pollen: Secondary | ICD-10-CM

## 2019-07-15 DIAGNOSIS — E7800 Pure hypercholesterolemia, unspecified: Secondary | ICD-10-CM | POA: Diagnosis not present

## 2019-07-15 DIAGNOSIS — N3010 Interstitial cystitis (chronic) without hematuria: Secondary | ICD-10-CM | POA: Diagnosis not present

## 2019-07-15 DIAGNOSIS — R7303 Prediabetes: Secondary | ICD-10-CM | POA: Diagnosis not present

## 2019-07-15 DIAGNOSIS — K219 Gastro-esophageal reflux disease without esophagitis: Secondary | ICD-10-CM | POA: Diagnosis not present

## 2019-07-15 DIAGNOSIS — G43009 Migraine without aura, not intractable, without status migrainosus: Secondary | ICD-10-CM | POA: Diagnosis not present

## 2019-07-21 ENCOUNTER — Encounter: Payer: Self-pay | Admitting: Registered Nurse

## 2019-07-21 ENCOUNTER — Encounter: Payer: Medicare Other | Attending: Physical Medicine & Rehabilitation | Admitting: Registered Nurse

## 2019-07-21 ENCOUNTER — Other Ambulatory Visit: Payer: Self-pay

## 2019-07-21 VITALS — BP 147/92 | HR 78 | Temp 97.7°F | Ht 63.0 in | Wt 236.0 lb

## 2019-07-21 DIAGNOSIS — M47816 Spondylosis without myelopathy or radiculopathy, lumbar region: Secondary | ICD-10-CM

## 2019-07-21 DIAGNOSIS — Z981 Arthrodesis status: Secondary | ICD-10-CM

## 2019-07-21 DIAGNOSIS — Z5181 Encounter for therapeutic drug level monitoring: Secondary | ICD-10-CM | POA: Diagnosis not present

## 2019-07-21 DIAGNOSIS — Z79899 Other long term (current) drug therapy: Secondary | ICD-10-CM

## 2019-07-21 DIAGNOSIS — M7062 Trochanteric bursitis, left hip: Secondary | ICD-10-CM | POA: Diagnosis not present

## 2019-07-21 DIAGNOSIS — G894 Chronic pain syndrome: Secondary | ICD-10-CM | POA: Diagnosis not present

## 2019-07-21 DIAGNOSIS — M797 Fibromyalgia: Secondary | ICD-10-CM

## 2019-07-21 DIAGNOSIS — M5416 Radiculopathy, lumbar region: Secondary | ICD-10-CM | POA: Diagnosis not present

## 2019-07-21 DIAGNOSIS — R200 Anesthesia of skin: Secondary | ICD-10-CM | POA: Diagnosis not present

## 2019-07-21 DIAGNOSIS — R202 Paresthesia of skin: Secondary | ICD-10-CM | POA: Diagnosis not present

## 2019-07-21 DIAGNOSIS — G8929 Other chronic pain: Secondary | ICD-10-CM | POA: Diagnosis not present

## 2019-07-21 DIAGNOSIS — M25562 Pain in left knee: Secondary | ICD-10-CM

## 2019-07-21 DIAGNOSIS — M542 Cervicalgia: Secondary | ICD-10-CM | POA: Diagnosis not present

## 2019-07-21 DIAGNOSIS — M25561 Pain in right knee: Secondary | ICD-10-CM | POA: Diagnosis not present

## 2019-07-21 MED ORDER — HYDROCODONE-ACETAMINOPHEN 10-325 MG PO TABS: ORAL_TABLET | ORAL | 0 refills | Status: DC

## 2019-07-21 MED FILL — HYDROCODON-APAP 10-325: 10-325 | 30 days supply | Qty: 90 | Fill #0

## 2019-07-21 NOTE — Progress Notes (Signed)
Subjective:    Patient ID: Karina Greer, female    DOB: [birth year], 53 y.o.   MRN: [id number]  HPI: Karina Greer is a 53 y.o. female who returns for follow up appointment for chronic pain and medication refill. She states her pain is located in her neck, lower back pain radiating into her left hip and bilateral knee pain. She rates her pain 8. Her current exercise regime is walking on tread mill for 15 minutes and performing stretching exercises.  Karina Greer reports at times when she's walking in park or walking on treadmill she becomes dizzy, she was instructed to F/U with her PCP and Cardiologist. Also instructed not to use her treadmill until she follows up with her PCP or cardiologist, she verbalizes understanding. No complaints of dizziness at this time.   Karina Greer Morphine equivalent is 30.00 MME. She  is also prescribed Alprazolam she's using the alprazolam sparingly last refill was in April, 2020 by Lorelle Gibbs, NP .We have discussed the black box warning of using opioids and benzodiazepines. I highlighted the dangers of using these drugs together and discussed the adverse events including respiratory suppression, overdose, cognitive impairment and importance of compliance with current regimen. We will continue to monitor and adjust as indicated.   She  is being closely monitored and under the care of her psychiatrist at [facility].   Pain Inventory Average Pain 8 Pain Right Now 8 My pain is intermittent, constant, sharp, dull, stabbing, tingling and aching  In the last 24 hours, has pain interfered with the following? General activity 7 Relation with others 7 Enjoyment of life 8 What TIME of day is your pain at its worst? all Sleep (in general) Fair  Pain is worse with: walking, bending, sitting, inactivity and standing Pain improves with: rest, heat/ice, therapy/exercise, pacing activities, medication and TENS Relief from Meds: 7  Mobility walk without assistance  how many minutes can you walk? 20 ability to climb steps?  yes do you drive?  yes Do you have any goals in this area?  yes  Function disabled: date disabled . I need assistance with the following:  meal prep, household duties and shopping Do you have any goals in this area?  yes  Neuro/Psych anxiety  Prior Studies Any changes since last visit?  no  Physicians involved in your care Any changes since last visit?  no   Family History  Problem Relation Age of Onset  . Hypertension Mother   . Diabetes Father   . Brain cancer Father   . Fibromyalgia Sister   . Suicidality Sister   . Hypertension Other        Cancer, Cerebrovascular disease run on mother side of family   Social History   Socioeconomic History  . Marital status: Divorced    Spouse name: Not on file  . Number of children: 3  . Years of education: HS  . Highest education level: 12th grade  Occupational History  . Occupation: Merchandiser, retail: UNEMPLOYED    Comment: Disability  Social Needs  . Financial resource strain: Not on file  . Food insecurity    Worry: Not on file    Inability: Not on file  . Transportation needs    Medical: Not on file    Non-medical: Not on file  Tobacco Use  . Smoking status: Never Smoker  . Smokeless tobacco: Never Used  Substance and Sexual Activity  . Alcohol use: No  . Drug  use: No  . Sexual activity: Not on file  Lifestyle  . Physical activity    Days per week: Not on file    Minutes per session: Not on file  . Stress: Not on file  Relationships  . Social Herbalist on phone: Not on file    Gets together: Not on file    Attends religious service: Not on file    Active member of club or organization: Not on file    Attends meetings of clubs or organizations: Not on file    Relationship status: Not on file  Other Topics Concern  . Not on file  Social History Narrative   Patient is right-handed. She avoids caffeine. She has recently been  using the treadmill.   Past Surgical History:  Procedure Laterality Date  . ACHILLES TENDON SURGERY Right 3/16  . ANTERIOR CERVICAL DECOMP/DISCECTOMY FUSION N/A 03/11/2016   Procedure: C5-6, C6-7 Anterior Cervical Discectomy and Fusion, Allograft, Plate;  Surgeon: Marybelle Killings, MD;  Location: [facility];  Service: Orthopedics;  Laterality: N/A;  . CHOLECYSTECTOMY  2014  . COLONOSCOPY WITH PROPOFOL N/A 08/25/2017   Procedure: COLONOSCOPY WITH PROPOFOL;  Surgeon: Manya Silvas, MD;  Location[facility] ENDOSCOPY;  Service: Endoscopy;  Laterality: N/A;  . CYSTO WITH HYDRODISTENSION N/A 01/26/2014   Procedure: CYSTOSCOPY/HYDRODISTENSION;  Surgeon: Bernestine Amass, MD;  Location[facility];  Service: Urology;  Laterality: N/A;  . CYSTO/  URETHRAL DILATION/  HYDRODISTENTION/   INSTILLATION THERAPY  07-16-2010//   12-30-2007//   10-27-2006  . EXERCISE TOLERENCE TEST  10-12-2010   NEGATIVE  ADEQUATE ETT/  NO ISCHEMIA OR EVIDENCE HIGH GRADE OBSTRUCTIVE CAD/  NO FURTHER TEST NEEDED  . [place] ENDOMETRIAL ABLATION  2014  . LAPAROSCOPIC OVARIAN CYST BX  2005   AND  URETEROSCOPIC LASER LITHO  STONE EXTRACTION  . SHOULDER OPEN ROTATOR CUFF REPAIR Right 2004  . TONSILLECTOMY    . TRANSTHORACIC ECHOCARDIOGRAM  06-07-2006   normal study/  ef 60-65%  . TUBAL LIGATION Bilateral 1995   Past Medical History:  Diagnosis Date  . Anxiety   . Anxiety disorder   . Arthritis   . Blepharitis   . Chronic low back pain   . Depression   . Dyslipidemia   . Fibromyalgia   . GERD (gastroesophageal reflux disease)   . Headache    hx migraines  . History of kidney stones   . History of panic attacks   . History of renal calculi   . IBS (irritable bowel syndrome)   . Interstitial cystitis   . Lupus ([place])    tested positive for antibodies for lupus. dr to do further tests  . Menorrhagia   . OSA on CPAP    not used cpap for several weeks  . Pneumonia    hx  . PONV (postoperative  nausea and vomiting)   . RLS (restless legs syndrome)   . Rosacea   . Seasonal asthma   . SI (sacroiliac) joint dysfunction   . SUI (stress urinary incontinence, female)   . UTI (lower urinary tract infection)    hx  . 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 02/23/2013   BP (!) 147/92   Pulse 78   Temp 97.7 F (36.5 C)   Ht 5\' 3"  (1.6 m)   Wt 236 lb (107 kg)   SpO2 97%   BMI 41.81 kg/m   Opioid Risk Score:   Fall Risk Score:  `  1  Depression screen PHQ 2/9  Depression screen [facility] 2/9 06/21/2019 08/12/2018 07/20/2018 06/19/2018 04/20/2018 01/29/2018 01/28/2018  Decreased Interest 0 0 1 1 0 0 1  Down, Depressed, Hopeless 0 0 1 1 0 0 1  PHQ - 2 Score 0 0 2 2 0 0 2  Altered sleeping - - - - - - -  Tired, decreased energy - - - - - - -  Change in appetite - - - - - - -  Feeling bad or failure about yourself  - - - - - - -  Trouble concentrating - - - - - - -  Moving slowly or fidgety/restless - - - - - - -  Suicidal thoughts - - - - - - -  PHQ-9 Score - - - - - - -  Difficult doing work/chores - - - - - - -  Some recent data might be hidden    Review of Systems  Constitutional: Negative.   HENT: Negative.   Eyes: Negative.   Respiratory: Negative.   Cardiovascular: Negative.   Gastrointestinal: Negative.   Endocrine: Negative.   Genitourinary: Negative.   Musculoskeletal: Positive for arthralgias, back pain and neck pain.  Skin: Negative.   Allergic/Immunologic: Negative.   Neurological: Negative.   Hematological: Negative.   Psychiatric/Behavioral: The patient is nervous/anxious.   All other systems reviewed and are negative.      Objective:   Physical Exam Vitals signs and nursing note reviewed.  Constitutional:      Appearance: Normal appearance. She is obese.  Neck:     Musculoskeletal: Normal range of motion and neck supple.  Cardiovascular:     Rate and Rhythm: Normal rate and regular rhythm.     Pulses: Normal pulses.     Heart sounds: Normal heart  sounds.  Pulmonary:     Effort: Pulmonary effort is normal.     Breath sounds: Normal breath sounds.  Musculoskeletal:     Comments: Normal Muscle Bulk and Muscle Testing Reveals:  Upper Extremities: Full ROM and Muscle Strength 5/5 Lumbar Paraspinal Tenderness: L-3-L-5 Left Greater Trochanter Tenderness Lower Extremities: Full ROM and Muscle Strength 5/5 Arises from chair with ease Narrow Based Gait   Skin:    General: Skin is warm and dry.  Neurological:     Mental Status: She is alert and oriented to person, place, and time.  Psychiatric:        Mood and Affect: Mood normal.        Behavior: Behavior normal.           Assessment & Plan:  1. Lumbago/ Lumbar Spondylosis/LeftLumbar Radiculitis: Continue to Monitor.07/21/2019. Refilled: HYDROcodone 10/325mg  one tablet every8hours as needed #90.  We will continue the opioid monitoring program, this consists of regular clinic visits, examinations, urine drug screen, pill counts as well as use of [place] Controlled Substance Reporting System. 2. Fibromyalgia. Continue Current exercise Regime.07/21/2019 3. Anxiety and depression:Psychiatry [facility]. Continue Counseling at [facility]. On Xanax,07/21/2019 4. Migraines: On Maxalt. Neurology Following.06/21/2019 5. OSA : Continue exercise regime as tolerated and losing weight.07/21/2019 6. Obesity: Following Healthy Diet Regimen.07/21/2019 7. Status Post Cervical Spinal Fusion: C5C6- C-6- C-7 Anterior Cervical Discectomy Fusion and Allograft Plate:Dr. Lorin Mercy Following.07/21/2019 8. Cervicalgia/ Cervical Radiculitis/ S/P Cervical Spinal Fusion: Continueto MonitorDr. Yates Following.07/21/2019 9. Muscle Spasm: Continue Tizanidine as needed.07/21/2019 10.Hereditary and Idiopathic Peripheral Neuropathy Continue with Tens Unit.07/21/2019 11. Left Greater Trochanteric Bursitis: Continue to Alternate Ice andheat Therapy.  Continue to Monitor.  07/21/2019.  38minutes of face to face patient care time was spent during this visit. All questions were encouraged and answered.  F/U in 1 month

## 2019-07-28 ENCOUNTER — Other Ambulatory Visit: Payer: Self-pay

## 2019-07-28 ENCOUNTER — Ambulatory Visit (INDEPENDENT_AMBULATORY_CARE_PROVIDER_SITE_OTHER): Payer: Medicare Other

## 2019-07-28 DIAGNOSIS — J301 Allergic rhinitis due to pollen: Secondary | ICD-10-CM | POA: Diagnosis not present

## 2019-08-02 DIAGNOSIS — J301 Allergic rhinitis due to pollen: Secondary | ICD-10-CM | POA: Diagnosis not present

## 2019-08-11 ENCOUNTER — Ambulatory Visit (INDEPENDENT_AMBULATORY_CARE_PROVIDER_SITE_OTHER): Payer: Medicare Other

## 2019-08-11 ENCOUNTER — Other Ambulatory Visit: Payer: Self-pay

## 2019-08-11 DIAGNOSIS — J301 Allergic rhinitis due to pollen: Secondary | ICD-10-CM | POA: Diagnosis not present

## 2019-08-12 DIAGNOSIS — R21 Rash and other nonspecific skin eruption: Secondary | ICD-10-CM | POA: Diagnosis not present

## 2019-08-12 DIAGNOSIS — Z20828 Contact with and (suspected) exposure to other viral communicable diseases: Secondary | ICD-10-CM | POA: Diagnosis not present

## 2019-08-12 DIAGNOSIS — R7303 Prediabetes: Secondary | ICD-10-CM | POA: Diagnosis not present

## 2019-08-12 DIAGNOSIS — R079 Chest pain, unspecified: Secondary | ICD-10-CM | POA: Diagnosis not present

## 2019-08-18 ENCOUNTER — Encounter: Payer: Medicare Other | Attending: Physical Medicine & Rehabilitation | Admitting: Registered Nurse

## 2019-08-18 ENCOUNTER — Other Ambulatory Visit: Payer: Self-pay

## 2019-08-18 ENCOUNTER — Encounter: Payer: Self-pay | Admitting: Registered Nurse

## 2019-08-18 VITALS — BP 132/97 | HR 70 | Temp 98.3°F | Ht 63.0 in | Wt 238.2 lb

## 2019-08-18 DIAGNOSIS — G894 Chronic pain syndrome: Secondary | ICD-10-CM

## 2019-08-18 DIAGNOSIS — R200 Anesthesia of skin: Secondary | ICD-10-CM | POA: Diagnosis not present

## 2019-08-18 DIAGNOSIS — M7062 Trochanteric bursitis, left hip: Secondary | ICD-10-CM | POA: Diagnosis not present

## 2019-08-18 DIAGNOSIS — M25562 Pain in left knee: Secondary | ICD-10-CM

## 2019-08-18 DIAGNOSIS — M542 Cervicalgia: Secondary | ICD-10-CM

## 2019-08-18 DIAGNOSIS — R300 Dysuria: Secondary | ICD-10-CM | POA: Diagnosis not present

## 2019-08-18 DIAGNOSIS — N390 Urinary tract infection, site not specified: Secondary | ICD-10-CM | POA: Diagnosis not present

## 2019-08-18 DIAGNOSIS — Z79899 Other long term (current) drug therapy: Secondary | ICD-10-CM | POA: Diagnosis not present

## 2019-08-18 DIAGNOSIS — G8929 Other chronic pain: Secondary | ICD-10-CM

## 2019-08-18 DIAGNOSIS — M7061 Trochanteric bursitis, right hip: Secondary | ICD-10-CM

## 2019-08-18 DIAGNOSIS — M47816 Spondylosis without myelopathy or radiculopathy, lumbar region: Secondary | ICD-10-CM

## 2019-08-18 DIAGNOSIS — Z981 Arthrodesis status: Secondary | ICD-10-CM

## 2019-08-18 DIAGNOSIS — M25561 Pain in right knee: Secondary | ICD-10-CM | POA: Diagnosis not present

## 2019-08-18 DIAGNOSIS — M797 Fibromyalgia: Secondary | ICD-10-CM

## 2019-08-18 DIAGNOSIS — R202 Paresthesia of skin: Secondary | ICD-10-CM | POA: Insufficient documentation

## 2019-08-18 DIAGNOSIS — Z5181 Encounter for therapeutic drug level monitoring: Secondary | ICD-10-CM

## 2019-08-18 MED ORDER — HYDROCODONE-ACETAMINOPHEN 10-325 MG PO TABS: ORAL_TABLET | ORAL | 0 refills | Status: DC

## 2019-08-18 NOTE — Progress Notes (Signed)
Subjective:    Patient ID: Karina Greer, female    DOB: 1966/02/06, 53 y.o.   MRN: [id number]  HPI: Karina Greer is a 53 y.o. female who returns for follow up appointment for chronic pain and medication refill. She states her pain is located in her neck, lower back, bilateral hips and bilateral knees. Karina Greer also reports she's having painful urination, she asked if this provider would order a urine culture. Urine culture ordered, Karina Greer verbalize understanding. She rates her pain 7. Her current exercise regime is walking and performing stretching exercises.  Karina Greer is 30.00MME.  UDS ordered today.     Pain Inventory Average Pain 8 Pain Right Now 7 My pain is na  In the last 24 hours, has pain interfered with the following? General activity 7 Relation with others 7 Enjoyment of life 7 What TIME of day is your pain at its worst? all Sleep (in general) Fair  Pain is worse with: walking, bending, sitting, standing and some activites Pain improves with: rest, heat/ice, therapy/exercise, pacing activities, medication and TENS Relief from Meds: 6  Mobility walk without assistance ability to climb steps?  yes do you drive?  yes  Function disabled: date disabled . I need assistance with the following:  meal prep, household duties and shopping  Neuro/Psych dizziness anxiety  Prior Studies Any changes since last visit?  yes  Neg covid 19 test one week ago  Physicians involved in your care Any changes since last visit?  no   Family History  Problem Relation Age of Onset  . Hypertension Mother   . Diabetes Father   . Brain cancer Father   . Fibromyalgia Sister   . Suicidality Sister   . Hypertension Other        Cancer, Cerebrovascular disease run on mother side of family   Social History   Socioeconomic History  . Marital status: Divorced    Spouse name: Not on file  . Number of children: 3  . Years of education: HS  . Highest  education level: 12th grade  Occupational History  . Occupation: Merchandiser, retail: UNEMPLOYED    Comment: Disability  Social Needs  . Financial resource strain: Not on file  . Food insecurity    Worry: Not on file    Inability: Not on file  . Transportation needs    Medical: Not on file    Non-medical: Not on file  Tobacco Use  . Smoking status: Never Smoker  . Smokeless tobacco: Never Used  Substance and Sexual Activity  . Alcohol use: No  . Drug use: No  . Sexual activity: Not on file  Lifestyle  . Physical activity    Days per week: Not on file    Minutes per session: Not on file  . Stress: Not on file  Relationships  . Social Herbalist on phone: Not on file    Gets together: Not on file    Attends religious service: Not on file    Active member of club or organization: Not on file    Attends meetings of clubs or organizations: Not on file    Relationship status: Not on file  Other Topics Concern  . Not on file  Social History Narrative   Patient is right-handed. She avoids caffeine. She has recently been using the treadmill.   Past Surgical History:  Procedure Laterality Date  . ACHILLES TENDON SURGERY Right 3/16  .  ANTERIOR CERVICAL DECOMP/DISCECTOMY FUSION N/A 03/11/2016   Procedure: C5-6, C6-7 Anterior Cervical Discectomy and Fusion, Allograft, Plate;  Surgeon: Marybelle Killings, MD;  Location: [facility];  Service: Orthopedics;  Laterality: N/A;  . CHOLECYSTECTOMY  2014  . COLONOSCOPY WITH PROPOFOL N/A 08/25/2017   Procedure: COLONOSCOPY WITH PROPOFOL;  Surgeon: Manya Silvas, MD;  Location[facility] ENDOSCOPY;  Service: Endoscopy;  Laterality: N/A;  . CYSTO WITH HYDRODISTENSION N/A 01/26/2014   Procedure: CYSTOSCOPY/HYDRODISTENSION;  Surgeon: Bernestine Amass, MD;  Location: [facility];  Service: Urology;  Laterality: N/A;  . CYSTO/  URETHRAL DILATION/  HYDRODISTENTION/   INSTILLATION THERAPY  07-16-2010//   12-30-2007//   10-27-2006  .  EXERCISE TOLERENCE TEST  10-12-2010   NEGATIVE  ADEQUATE ETT/  NO ISCHEMIA OR EVIDENCE HIGH GRADE OBSTRUCTIVE CAD/  NO FURTHER TEST NEEDED  . [place] ENDOMETRIAL ABLATION  2014  . LAPAROSCOPIC OVARIAN CYST BX  2005   AND  URETEROSCOPIC LASER LITHO  STONE EXTRACTION  . SHOULDER OPEN ROTATOR CUFF REPAIR Right 2004  . TONSILLECTOMY    . TRANSTHORACIC ECHOCARDIOGRAM  06-07-2006   normal study/  ef 60-65%  . TUBAL LIGATION Bilateral 1995   Past Medical History:  Diagnosis Date  . Anxiety   . Anxiety disorder   . Arthritis   . Blepharitis   . Chronic low back pain   . Depression   . Dyslipidemia   . Fibromyalgia   . GERD (gastroesophageal reflux disease)   . Headache    hx migraines  . History of kidney stones   . History of panic attacks   . History of renal calculi   . IBS (irritable bowel syndrome)   . Interstitial cystitis   . Lupus ([place])    tested positive for antibodies for lupus. dr to do further tests  . Menorrhagia   . OSA on CPAP    not used cpap for several weeks  . Pneumonia    hx  . PONV (postoperative nausea and vomiting)   . RLS (restless legs syndrome)   . Rosacea   . Seasonal asthma   . SI (sacroiliac) joint dysfunction   . SUI (stress urinary incontinence, female)   . UTI (lower urinary tract infection)    hx  . 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 02/23/2013   BP (!) 132/97   Pulse 70   Temp 98.3 F (36.8 C)   Ht 5\' 3"  (1.6 m)   Wt 238 lb 3.2 oz (108 kg)   SpO2 97%   BMI 42.20 kg/m   Opioid Risk Score:   Fall Risk Score:  `1  Depression screen PHQ 2/9  Depression screen [facility] 2/9 08/18/2019 06/21/2019 08/12/2018 07/20/2018 06/19/2018 04/20/2018 01/29/2018  Decreased Interest 2 0 0 1 1 0 0  Down, Depressed, Hopeless 2 0 0 1 1 0 0  PHQ - 2 Score 4 0 0 2 2 0 0  Altered sleeping - - - - - - -  Tired, decreased energy - - - - - - -  Change in appetite - - - - - - -  Feeling bad or failure about yourself  - - - - - - -  Trouble  concentrating - - - - - - -  Moving slowly or fidgety/restless - - - - - - -  Suicidal thoughts - - - - - - -  PHQ-9 Score - - - - - - -  Difficult doing work/chores - - - - - - -  Some recent data might be hidden    Review of Systems  Constitutional: Positive for appetite change.       Poor appetite  HENT: Negative.   Eyes: Negative.   Respiratory:       Resp infections  Cardiovascular: Negative.   Gastrointestinal: Positive for nausea.  Endocrine: Negative.   Genitourinary: Negative.   Musculoskeletal: Negative.   Skin: Negative.   Allergic/Immunologic: Negative.   Neurological: Positive for dizziness.  Hematological: Negative.   Psychiatric/Behavioral: The patient is nervous/anxious.   All other systems reviewed and are negative.      Objective:   Physical Exam Vitals signs and nursing note reviewed.  Constitutional:      Appearance: Normal appearance.  Neck:     Musculoskeletal: Normal range of motion and neck supple.  Cardiovascular:     Rate and Rhythm: Normal rate and regular rhythm.     Pulses: Normal pulses.     Heart sounds: Normal heart sounds.  Pulmonary:     Effort: Pulmonary effort is normal.     Breath sounds: Normal breath sounds.  Musculoskeletal:     Comments: Normal Muscle Bulk and Muscle Testing Reveals:  Upper Extremities: Full ROM and Muscle Strength 5/5 , Lumbar Paraspinal Tenderness: L-3-L-5 Bilateral Greater Trochanter Tenderness Lower Extremities: Full ROM and Muscle Strength 5/5 Arises from Table with Ease Narrow Based  Gait   Skin:    General: Skin is warm and dry.  Neurological:     Mental Status: She is alert and oriented to person, place, and time.  Psychiatric:        Mood and Affect: Mood normal.        Behavior: Behavior normal.           Assessment & Plan:  1. Lumbago/ Lumbar Spondylosis/LeftLumbar Radiculitis: Continue to Monitor.08/18/2019. Refilled: HYDROcodone 10/325mg  one tablet every8hours as needed #90.   We will continue the opioid monitoring program, this consists of regular clinic visits, examinations, urine drug screen, pill counts as well as use of [place] Controlled Substance Reporting System. 2. Fibromyalgia. Continue Current exercise Regime.08/18/2019 3. Anxiety and depression:[facility]iatry [facility]. Continue Counseling at [facility]. On Xanax,08/18/2019 4. Migraines: On Maxalt. Neurology Following.08/18/2019 5. OSA : Continue exercise regime as tolerated and losing weight.08/18/2019 6. Obesity: Following Healthy Diet Regimen.08/18/2019 7. Status Post Cervical Spinal Fusion: C5C6- C-6- C-7 Anterior Cervical Discectomy Fusion and Allograft Plate:Dr. Lorin Mercy Following.08/18/2019 8. Cervicalgia/ Cervical Radiculitis/ S/P Cervical Spinal Fusion: Continueto MonitorDr. Yates Following.08/18/2019 9. Muscle Spasm: Continue Tizanidine as needed.08/18/2019 10.Hereditary and Idiopathic Peripheral Neuropathy Continue with Tens Unit.08/18/2019 11. Bilateral Greater Trochanteric Bursitis: Continue to Alternate Ice andheat Therapy. Continue to Monitor. 08/18/2019. 12/ UTI/ Dysuria: RX: Urine Culture 13. Chronic Bilateral Knee Pain: Continue HEP as Tolerated. Continue to Monitor.   29minutes of face to face patient care time was spent during this visit. All questions were encouraged and answered.  F/U in 1 month

## 2019-08-20 LAB — URINE CULTURE

## 2019-08-20 MED FILL — HYDROCODON-APAP 10-325: 10-325 | 30 days supply | Qty: 90 | Fill #0

## 2019-08-25 ENCOUNTER — Ambulatory Visit: Payer: Medicare Other

## 2019-08-25 LAB — TOXASSURE SELECT,+ANTIDEPR,UR

## 2019-08-31 ENCOUNTER — Telehealth: Payer: Self-pay | Admitting: *Deleted

## 2019-08-31 NOTE — Telephone Encounter (Signed)
Urine drug screen for this encounter is consistent for prescribed medication. She has been prescribed alprazolam by Lorelle Gibbs NP.

## 2019-09-01 DIAGNOSIS — H9209 Otalgia, unspecified ear: Secondary | ICD-10-CM | POA: Diagnosis not present

## 2019-09-01 DIAGNOSIS — M792 Neuralgia and neuritis, unspecified: Secondary | ICD-10-CM | POA: Diagnosis not present

## 2019-09-02 ENCOUNTER — Telehealth: Payer: Self-pay

## 2019-09-02 NOTE — Telephone Encounter (Signed)
Confirmed appointment with patient. klh °

## 2019-09-06 ENCOUNTER — Ambulatory Visit (INDEPENDENT_AMBULATORY_CARE_PROVIDER_SITE_OTHER): Payer: Medicare Other | Admitting: Internal Medicine

## 2019-09-06 ENCOUNTER — Encounter: Payer: Self-pay | Admitting: Internal Medicine

## 2019-09-06 ENCOUNTER — Other Ambulatory Visit: Payer: Self-pay

## 2019-09-06 VITALS — BP 136/75 | HR 77 | Temp 97.7°F | Resp 16 | Ht 63.0 in | Wt 241.0 lb

## 2019-09-06 DIAGNOSIS — J301 Allergic rhinitis due to pollen: Secondary | ICD-10-CM | POA: Diagnosis not present

## 2019-09-06 DIAGNOSIS — J45909 Unspecified asthma, uncomplicated: Secondary | ICD-10-CM

## 2019-09-06 DIAGNOSIS — G4733 Obstructive sleep apnea (adult) (pediatric): Secondary | ICD-10-CM | POA: Diagnosis not present

## 2019-09-06 DIAGNOSIS — K219 Gastro-esophageal reflux disease without esophagitis: Secondary | ICD-10-CM

## 2019-09-06 MED ORDER — ALBUTEROL SULFATE (2.5 MG/3ML) 0.083% IN NEBU: 2.5000 mg | INHALATION_SOLUTION | Freq: Four times a day (QID) | RESPIRATORY_TRACT | 1 refills | Status: DC | PRN

## 2019-09-06 MED ORDER — MONTELUKAST SODIUM 10 MG PO TABS: 10.0000 mg | ORAL_TABLET | Freq: Every day | ORAL | 2 refills | Status: DC

## 2019-09-06 NOTE — Progress Notes (Signed)
[facility] [place], [place] [address]  Pulmonary Sleep Medicine   Office Visit Note  Patient Name: Karina Greer DOB: 1966-07-07 MRN [id number]  Date of Service: 09/06/2019  Complaints/HPI: Pt is here for pulmonary follow up.  She reports overall she is at baseline.  She is getting over an upper respiratory infection.  She reports head congestion.  She is using her humidifier and taking her allergy shots as well as her allergy medications.  She denies fever or other symptoms. She saw ENT last week for some ongoing ear/throat discomfort as well as hoarsness in her voice.  He apparently is sending her to neuro to rule out neuralgia. She reports she still struggles to wear her cpap, especially these last few weeks due to these ongoing issues.     ROS  General: (-) fever, (-) chills, (-) night sweats, (-) weakness Skin: (-) rashes, (-) itching,. Eyes: (-) visual changes, (-) redness, (-) itching. Nose and Sinuses: (-) nasal stuffiness or itchiness, (-) postnasal drip, (-) nosebleeds, (-) sinus trouble. Mouth and Throat: (-) sore throat, (-) hoarseness. Neck: (-) swollen glands, (-) enlarged thyroid, (-) neck pain. Respiratory: - cough, (-) bloody sputum, - shortness of breath, - wheezing. Cardiovascular: - ankle swelling, (-) chest pain. Lymphatic: (-) lymph node enlargement. Neurologic: (-) numbness, (-) tingling. Psychiatric: (-) anxiety, (-) depression   Current Medication: Outpatient Encounter Medications as of 09/06/2019  Medication Sig Note  . albuterol (PROVENTIL) (2.5 MG/3ML) 0.083% nebulizer solution INHALE 1 VIAL VIA NEBULIZER EVERY 6 HOURS AS NEEDED   . azelastine (ASTELIN) 0.1 % nasal spray USE 2 SPRAYS INTO EACH NOSTRIL TWICE A DAY 06/30/2019: Prn   . b complex vitamins tablet Take 1 tablet by mouth daily.   [name] DEXILANT 30 MG capsule Take 1 capsule by mouth 2 (two) times daily.   [name] dicyclomine (BENTYL) 20 MG tablet Take 20 mg by mouth every 6  (six) hours.   [name] EPIPEN 2-PAK 0.3 MG/0.3ML SOAJ injection Use as directed   . fexofenadine (ALLEGRA) 180 MG tablet Take 180 mg by mouth daily.   [name] FLOVENT HFA 110 MCG/ACT inhaler TAKE 1 PUFF BY MOUTH TWICE A DAY 06/30/2019: Prn   . HYDROcodone-acetaminophen (NORCO) 10-325 MG tablet TAKE 1 TABLET BY MOUTH EVERY 8 HOURS AS NEEDED FOR PAIN   . meclizine (ANTIVERT) 25 MG tablet Take 1 tablet by mouth 3 (three) times daily as needed.   . ondansetron (ZOFRAN-ODT) 4 MG disintegrating tablet Take 1 tablet by mouth 3 (three) times daily as needed.   [name] PRESCRIPTION MEDICATION every 14 (fourteen) days. 2 ALLERGY INJECTIONS EVERY 2 WKS   . PROAIR HFA 108 (90 Base) MCG/ACT inhaler INHALE 2 PUFFS BY MOUTH EVERY 4 TO 6 HOURS AS NEEDED   . tiZANidine (ZANAFLEX) 2 MG tablet TAKE 1 TABLET (2 MG TOTAL) BY MOUTH EVERY 8 (EIGHT) HOURS AS NEEDED.   . Vitamin D, Ergocalciferol, (DRISDOL) 50000 units CAPS capsule Take 50,000 Units by mouth every 7 (seven) days. THURSDAYS    No facility-administered encounter medications on file as of 09/06/2019.    Surgical History: Past Surgical History:  Procedure Laterality Date  . ACHILLES TENDON SURGERY Right 3/16  . ANTERIOR CERVICAL DECOMP/DISCECTOMY FUSION N/A 03/11/2016   Procedure: C5-6, C6-7 Anterior Cervical Discectomy and Fusion, Allograft, Plate;  Surgeon: Marybelle Killings, MD;  Location: [facility];  Service: Orthopedics;  Laterality: N/A;  . CHOLECYSTECTOMY  2014  . COLONOSCOPY WITH PROPOFOL N/A 08/25/2017   Procedure: COLONOSCOPY  WITH PROPOFOL;  Surgeon: Manya Silvas, MD;  Location[facility] ENDOSCOPY;  Service: Endoscopy;  Laterality: N/A;  . CYSTO WITH HYDRODISTENSION N/A 01/26/2014   Procedure: CYSTOSCOPY/HYDRODISTENSION;  Surgeon: Bernestine Amass, MD;  Location: [facility];  Service: Urology;  Laterality: N/A;  . CYSTO/  URETHRAL DILATION/  HYDRODISTENTION/   INSTILLATION THERAPY  07-16-2010//   12-30-2007//   10-27-2006  . EXERCISE TOLERENCE TEST   10-12-2010   NEGATIVE  ADEQUATE ETT/  NO ISCHEMIA OR EVIDENCE HIGH GRADE OBSTRUCTIVE CAD/  NO FURTHER TEST NEEDED  . [place] ENDOMETRIAL ABLATION  2014  . LAPAROSCOPIC OVARIAN CYST BX  2005   AND  URETEROSCOPIC LASER LITHO  STONE EXTRACTION  . SHOULDER OPEN ROTATOR CUFF REPAIR Right 2004  . TONSILLECTOMY    . TRANSTHORACIC ECHOCARDIOGRAM  06-07-2006   normal study/  ef 60-65%  . TUBAL LIGATION Bilateral 1995    Medical History: Past Medical History:  Diagnosis Date  . Anxiety   . Anxiety disorder   . Arthritis   . Blepharitis   . Chronic low back pain   . Depression   . Dyslipidemia   . Fibromyalgia   . GERD (gastroesophageal reflux disease)   . Headache    hx migraines  . History of kidney stones   . History of panic attacks   . History of renal calculi   . IBS (irritable bowel syndrome)   . Interstitial cystitis   . Lupus ([place])    tested positive for antibodies for lupus. dr to do further tests  . Menorrhagia   . OSA on CPAP    not used cpap for several weeks  . Pneumonia    hx  . PONV (postoperative nausea and vomiting)   . RLS (restless legs syndrome)   . Rosacea   . Seasonal asthma   . SI (sacroiliac) joint dysfunction   . SUI (stress urinary incontinence, female)   . UTI (lower urinary tract infection)    hx  . 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 02/23/2013    Family History: Family History  Problem Relation Age of Onset  . Hypertension Mother   . Diabetes Father   . Brain cancer Father   . Fibromyalgia Sister   . Suicidality Sister   . Hypertension Other        Cancer, Cerebrovascular disease run on mother side of family    Social History: Social History   Socioeconomic History  . Marital status: Divorced    Spouse name: Not on file  . Number of children: 3  . Years of education: HS  . Highest education level: 12th grade  Occupational History  . Occupation: Merchandiser, retail: UNEMPLOYED    Comment: Disability   Tobacco Use  . Smoking status: Never Smoker  . Smokeless tobacco: Never Used  Substance and Sexual Activity  . Alcohol use: No  . Drug use: No  . Sexual activity: Not on file  Other Topics Concern  . Not on file  Social History Narrative   Patient is right-handed. She avoids caffeine. She has recently been using the treadmill.   Social Determinants of Health   Financial Resource Strain:   . Difficulty of Paying Living Expenses: Not on file  Food Insecurity:   . Worried About Charity fundraiser in the Last Year: Not on file  . Ran Out of Food in the Last Year: Not on file  Transportation Needs:   . Lack  of Transportation (Medical): Not on file  . Lack of Transportation (Non-Medical): Not on file  Physical Activity:   . Days of Exercise per Week: Not on file  . Minutes of Exercise per Session: Not on file  Stress:   . Feeling of Stress : Not on file  Social Connections:   . Frequency of Communication with Friends and Family: Not on file  . Frequency of Social Gatherings with Friends and Family: Not on file  . Attends Religious Services: Not on file  . Active Member of Clubs or Organizations: Not on file  . Attends Archivist Meetings: Not on file  . Marital Status: Not on file  Intimate Partner Violence:   . Fear of Current or Ex-Partner: Not on file  . Emotionally Abused: Not on file  . Physically Abused: Not on file  . Sexually Abused: Not on file    Vital Signs: Blood pressure 136/75, pulse 77, temperature 97.7 F (36.5 C), resp. rate 16, height 5\' 3"  (1.6 m), weight 241 lb (109.3 kg), SpO2 96 %.  Examination: General Appearance: The patient is well-developed, well-nourished, and in no distress. Skin: Gross inspection of skin unremarkable. Head: normocephalic, no gross deformities. Eyes: no gross deformities noted. ENT: ears appear grossly normal no exudates. Neck: Supple. No thyromegaly. No LAD. Respiratory: clear bilaterally. Cardiovascular: Normal  S1 and S2 without murmur or rub. Extremities: No cyanosis. pulses are equal. Neurologic: Alert and oriented. No involuntary movements.  LABS: Recent Results (from the past 2160 hour(s))  ToxAssure Select Plus     Status: None   Collection Time: 08/18/19 10:03 AM  Result Value Ref Range   Summary Note     Comment: ==================================================================== ToxAssure Select,+Antidepr,UR ==================================================================== Test                             Result       Flag       Units Drug Present   Alprazolam                     33                      ng/mg creat   Alpha-hydroxyalprazolam        64                      ng/mg creat    Source of alprazolam is a scheduled prescription medication. Alpha-    hydroxyalprazolam is an expected metabolite of alprazolam.   Hydrocodone                    1281                    ng/mg creat   Hydromorphone                  308                     ng/mg creat   Dihydrocodeine                 163                     ng/mg creat   Norhydrocodone                 781  ng/mg creat    Sources of hydrocodone include scheduled prescription medications.    Hydromorphone, dihydrocodeine and norhydrocodone are expected    metabolites of hydrocodone. Hydromorphone and dihydrocod eine are    also available as scheduled prescription medications. ==================================================================== Test                      Result    Flag   Units      Ref Range   Creatinine              211              mg/dL      >=20 ==================================================================== Declared Medications:  Medication list was not provided. ==================================================================== For clinical consultation, please call [phone]. ====================================================================   Urine Culture     Status: None    Collection Time: 08/18/19 10:03 AM   Specimen: Urine   UR  Result Value Ref Range   Urine Culture, Routine Final report    Organism ID, Bacteria Comment     Comment: Mixed urogenital flora Less than 10,000 colonies/mL     Radiology: NM GASTRIC EMPTYING  Result Date: 06/17/2019 CLINICAL DATA:  Chronic nausea for years, occasional vomiting, diarrhea, mid abdominal pain, history irritable bowel syndrome and GERD EXAM: NUCLEAR MEDICINE GASTRIC EMPTYING SCAN TECHNIQUE: After oral ingestion of radiolabeled meal, sequential abdominal images were obtained for 4 hours. Percentage of activity emptying the stomach was calculated at 1 hour, 2 hour, 3 hour, and 4 hours. RADIOPHARMACEUTICALS:  2.6 mCi Tc-45m sulfur colloid in standardized meal COMPARISON:  None FINDINGS: Expected location of the stomach in the left upper quadrant. Ingested meal empties the stomach gradually over the course of the study. 26% emptied at 1 hr ( normal >= 10%) 78% emptied at 2 hr ( normal >= 40%) 94% emptied at 3 hr ( normal >= 70%) 100% emptied at 4 hr ( normal >= 90%) IMPRESSION: Normal gastric emptying study. Electronically Signed   By: Lavonia Dana M.D.   On: 06/17/2019 14:39    No results found.  No results found.    Assessment and Plan: Patient Active Problem List   Diagnosis Date Noted  . Lateral epicondylitis, right elbow 05/25/2019  . Atherosclerosis of aorta ([place]) 10/28/2018  . Elevated glucose 10/28/2018  . Mixed hyperlipidemia 10/28/2018  . Hypothyroidism 07/20/2018  . LLQ pain 12/03/2017  . Pre-diabetes 06/03/2017  . Rectal bleeding 06/02/2017  . S/P cervical spinal fusion 03/11/2016  . Bilateral hand pain 11/29/2015  . Elevated C-reactive protein 11/29/2015  . Elevated rheumatoid factor 11/29/2015  . Raynaud's phenomenon without gangrene 11/29/2015  . Bloating 09/12/2014  . Dizziness 08/08/2014  . Peri-menopause 08/08/2014  . Chronic tension-type headache, intractable 07/13/2014  .  Gastroesophageal reflux disease without esophagitis 07/12/2014  . Anxiety 04/18/2014  . Chronic arthritis 04/18/2014  . Cystitis 04/18/2014  . Morbid obesity ([place]) 04/18/2014  . Fibromyalgia 04/18/2014  . 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 Arthropathy 04/18/2014  . Cephalalgia 04/04/2014  . Numbness and tingling 04/04/2014  . 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 Nonspecific abnormal findings on radiological and other examination of skull and head 02/23/2013  . Other nonspecific abnormal result of function study of brain and central nervous system 07/01/2012  . Disturbance of skin sensation 07/01/2012  . Migraine without aura 07/01/2012  . Abdominal pain 02/11/2012  . Chronic fatigue syndrome 02/11/2012  . Chronic interstitial cystitis 02/11/2012  .  Dyspareunia 02/11/2012  . Dysuria 02/11/2012  . Female stress incontinence 02/11/2012  . Irritable bowel syndrome with diarrhea 02/11/2012  . Nausea and vomiting 02/11/2012  . Nocturia 02/11/2012  . Urge incontinence 02/11/2012  . Urinary urgency 02/11/2012  . Lumbar spondylosis 02/04/2012  . Myalgia and myositis 02/04/2012  . Depression 02/04/2012  . Lumbosacral spondylosis without myelopathy 02/04/2012  . OBESITY, UNSPECIFIED 10/03/2010  . GENERALIZED ANXIETY DISORDER 10/03/2010  . Chest pain 10/03/2010    1. OSA (obstructive sleep apnea) Pt encouraged to continue cpap use. She is trying.  May need new machine, as hers is over 58 years old.   2. Seasonal allergic rhinitis due to pollen Use Singulair as prescribed.  Stop allegra.   3. Moderate asthma, unspecified whether complicated, unspecified whether persistent Stable, Continue to use nebulizer as discussed.   4. Gastroesophageal reflux disease without esophagitis Stable, continue present management.   5. Morbid obesity ([place]) Obesity Counseling: Risk Assessment: An assessment of behavioral  risk factors was made today and includes lack of exercise sedentary lifestyle, lack of portion control and poor dietary habits.  Risk Modification Advice: She was counseled on portion control guidelines. Restricting daily caloric intake to. . The detrimental long term effects of obesity on her health and ongoing poor compliance was also discussed with the patient.    General Counseling: I have discussed the findings of the evaluation and examination with Levada Dy.  I have also discussed any further diagnostic evaluation thatmay be needed or ordered today. Vinda verbalizes understanding of the findings of todays visit. We also reviewed her medications today and discussed drug interactions and side effects including but not limited excessive drowsiness and altered mental states. We also discussed that there is always a risk not just to her but also people around her. she has been encouraged to call the office with any questions or concerns that should arise related to todays visit.  No orders of the defined types were placed in this encounter.    Time spent: 15 This patient was seen by Orson Gear AGNP-C in Collaboration with Dr. Devona Konig as a part of collaborative care agreement.   I have personally obtained a history, examined the patient, evaluated laboratory and imaging results, formulated the assessment and plan and placed orders.    Allyne Gee, MD [facility] Pulmonary and Critical Care Sleep medicine

## 2019-09-08 ENCOUNTER — Other Ambulatory Visit: Payer: Self-pay | Admitting: Registered Nurse

## 2019-09-15 ENCOUNTER — Encounter: Payer: Self-pay | Admitting: Registered Nurse

## 2019-09-15 ENCOUNTER — Encounter: Payer: Medicare Other | Attending: Physical Medicine & Rehabilitation | Admitting: Registered Nurse

## 2019-09-15 ENCOUNTER — Other Ambulatory Visit: Payer: Self-pay

## 2019-09-15 VITALS — BP 136/89 | HR 70 | Temp 97.7°F | Ht 63.0 in | Wt 241.0 lb

## 2019-09-15 DIAGNOSIS — R202 Paresthesia of skin: Secondary | ICD-10-CM | POA: Insufficient documentation

## 2019-09-15 DIAGNOSIS — Z5181 Encounter for therapeutic drug level monitoring: Secondary | ICD-10-CM

## 2019-09-15 DIAGNOSIS — M47816 Spondylosis without myelopathy or radiculopathy, lumbar region: Secondary | ICD-10-CM | POA: Diagnosis not present

## 2019-09-15 DIAGNOSIS — Z79899 Other long term (current) drug therapy: Secondary | ICD-10-CM | POA: Diagnosis present

## 2019-09-15 DIAGNOSIS — M7061 Trochanteric bursitis, right hip: Secondary | ICD-10-CM

## 2019-09-15 DIAGNOSIS — G894 Chronic pain syndrome: Secondary | ICD-10-CM

## 2019-09-15 DIAGNOSIS — Z981 Arthrodesis status: Secondary | ICD-10-CM | POA: Diagnosis not present

## 2019-09-15 DIAGNOSIS — N390 Urinary tract infection, site not specified: Secondary | ICD-10-CM | POA: Diagnosis present

## 2019-09-15 DIAGNOSIS — R200 Anesthesia of skin: Secondary | ICD-10-CM | POA: Diagnosis not present

## 2019-09-15 DIAGNOSIS — M797 Fibromyalgia: Secondary | ICD-10-CM

## 2019-09-15 DIAGNOSIS — M7062 Trochanteric bursitis, left hip: Secondary | ICD-10-CM

## 2019-09-15 DIAGNOSIS — M542 Cervicalgia: Secondary | ICD-10-CM | POA: Diagnosis not present

## 2019-09-15 DIAGNOSIS — R519 Headache, unspecified: Secondary | ICD-10-CM

## 2019-09-15 MED ORDER — HYDROCODONE-ACETAMINOPHEN 10-325 MG PO TABS: ORAL_TABLET | ORAL | 0 refills | Status: DC

## 2019-09-15 NOTE — Progress Notes (Signed)
Subjective:    Patient ID: Karina Greer, female    DOB: 1966/02/09, 53 y.o.   MRN: [id number]  HPI: Karina Greer is a 53 y.o. female who returns for follow up appointment for chronic pain and medication refill. She states her pain is located in her neck, lower back pain and bilateral hip pain. Also reports she has a headache this morning. She rates her pain 7. Her current exercise regime is walking and performing stretching exercises.  Karina Greer equivalent is 30.00 MME. She was  prescribed Clonazepam by Dr. Melrose Nakayama , she reports her last dose was Sunday 09/12/2019.  We have discussed the black box warning of using opioids and benzodiazepines. I highlighted the dangers of using these drugs together and discussed the adverse events including respiratory suppression, overdose, cognitive impairment and importance of compliance with current regimen. We will continue to monitor and adjust as indicated.   She is being closely monitored and under the care of her psychiatry at [facility].   Last UDS was Performed on 08/18/2019, it was consistent.    Pain Inventory Average Pain 8 Pain Right Now 7 My pain is constant and sharp  In the last 24 hours, has pain interfered with the following? General activity 5 Relation with others 6 Enjoyment of life 7 What TIME of day is your pain at its worst? all Sleep (in general) Fair  Pain is worse with: walking, bending, sitting, inactivity, standing, unsure and some activites Pain improves with: medication Relief from Meds: 7  Mobility walk without assistance ability to climb steps?  yes do you drive?  yes Do you have any goals in this area?  yes  Function disabled: date disabled . I need assistance with the following:  meal prep and household duties  Neuro/Psych dizziness anxiety  Prior Studies Any changes since last visit?  no  Physicians involved in your care Any changes since last visit?  no   Family History    Problem Relation Age of Onset  . Hypertension Mother   . Diabetes Father   . Brain cancer Father   . Fibromyalgia Sister   . Suicidality Sister   . Hypertension Other        Cancer, Cerebrovascular disease run on mother side of family   Social History   Socioeconomic History  . Marital status: Divorced    Spouse name: Not on file  . Number of children: 3  . Years of education: HS  . Highest education level: 12th grade  Occupational History  . Occupation: Merchandiser, retail: UNEMPLOYED    Comment: Disability  Tobacco Use  . Smoking status: Never Smoker  . Smokeless tobacco: Never Used  Substance and Sexual Activity  . Alcohol use: No  . Drug use: No  . Sexual activity: Not on file  Other Topics Concern  . Not on file  Social History Narrative   Patient is right-handed. She avoids caffeine. She has recently been using the treadmill.   Social Determinants of Health   Financial Resource Strain:   . Difficulty of Paying Living Expenses: Not on file  Food Insecurity:   . Worried About Charity fundraiser in the Last Year: Not on file  . Ran Out of Food in the Last Year: Not on file  Transportation Needs:   . Lack of Transportation (Medical): Not on file  . Lack of Transportation (Non-Medical): Not on file  Physical Activity:   . Days of Exercise  per Week: Not on file  . Minutes of Exercise per Session: Not on file  Stress:   . Feeling of Stress : Not on file  Social Connections:   . Frequency of Communication with Friends and Family: Not on file  . Frequency of Social Gatherings with Friends and Family: Not on file  . Attends Religious Services: Not on file  . Active Member of Clubs or Organizations: Not on file  . Attends Archivist Meetings: Not on file  . Marital Status: Not on file   Past Surgical History:  Procedure Laterality Date  . ACHILLES TENDON SURGERY Right 3/16  . ANTERIOR CERVICAL DECOMP/DISCECTOMY FUSION N/A 03/11/2016   Procedure:  C5-6, C6-7 Anterior Cervical Discectomy and Fusion, Allograft, Plate;  Surgeon: Marybelle Killings, MD;  Location: [place];  Service: Orthopedics;  Laterality: N/A;  . CHOLECYSTECTOMY  2014  . COLONOSCOPY WITH PROPOFOL N/A 08/25/2017   Procedure: COLONOSCOPY WITH PROPOFOL;  Surgeon: Manya Silvas, MD;  Location: [facility] ENDOSCOPY;  Service: Endoscopy;  Laterality: N/A;  . CYSTO WITH HYDRODISTENSION N/A 01/26/2014   Procedure: CYSTOSCOPY/HYDRODISTENSION;  Surgeon: Bernestine Amass, MD;  Location: [facility];  Service: Urology;  Laterality: N/A;  . CYSTO/  URETHRAL DILATION/  HYDRODISTENTION/   INSTILLATION THERAPY  07-16-2010//   12-30-2007//   10-27-2006  . EXERCISE TOLERENCE TEST  10-12-2010   NEGATIVE  ADEQUATE ETT/  NO ISCHEMIA OR EVIDENCE HIGH GRADE OBSTRUCTIVE CAD/  NO FURTHER TEST NEEDED  . [place] ENDOMETRIAL ABLATION  2014  . LAPAROSCOPIC OVARIAN CYST BX  2005   AND  URETEROSCOPIC LASER LITHO  STONE EXTRACTION  . SHOULDER OPEN ROTATOR CUFF REPAIR Right 2004  . TONSILLECTOMY    . TRANSTHORACIC ECHOCARDIOGRAM  06-07-2006   normal study/  ef 60-65%  . TUBAL LIGATION Bilateral 1995   Past Medical History:  Diagnosis Date  . Anxiety   . Anxiety disorder   . Arthritis   . Blepharitis   . Chronic low back pain   . Depression   . Dyslipidemia   . Fibromyalgia   . GERD (gastroesophageal reflux disease)   . Headache    hx migraines  . History of kidney stones   . History of panic attacks   . History of renal calculi   . IBS (irritable bowel syndrome)   . Interstitial cystitis   . Lupus ([place])    tested positive for antibodies for lupus. dr to do further tests  . Menorrhagia   . OSA on CPAP    not used cpap for several weeks  . Pneumonia    hx  . PONV (postoperative nausea and vomiting)   . RLS (restless legs syndrome)   . Rosacea   . Seasonal asthma   . SI (sacroiliac) joint dysfunction   . SUI (stress urinary incontinence, female)   . UTI (lower  urinary tract infection)    hx  . 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 02/23/2013   Temp 97.7 F (36.5 C) (Temporal)   Ht 5\' 3"  (1.6 m)   Wt 241 lb (109.3 kg)   BMI 42.69 kg/m   Opioid Risk Score:   Fall Risk Score:  `1  Depression screen PHQ 2/9  Depression screen [facility] 2/9 08/18/2019 06/21/2019 08/12/2018 07/20/2018 06/19/2018 04/20/2018 01/29/2018  Decreased Interest 2 0 0 1 1 0 0  Down, Depressed, Hopeless 2 0 0 1 1 0 0  PHQ - 2 Score 4 0 0 2 2 0  0  Altered sleeping - - - - - - -  Tired, decreased energy - - - - - - -  Change in appetite - - - - - - -  Feeling bad or failure about yourself  - - - - - - -  Trouble concentrating - - - - - - -  Moving slowly or fidgety/restless - - - - - - -  Suicidal thoughts - - - - - - -  PHQ-9 Score - - - - - - -  Difficult doing work/chores - - - - - - -  Some recent data might be hidden    Review of Systems  Constitutional: Negative.   HENT: Negative.   Eyes: Negative.   Respiratory: Negative.   Cardiovascular: Negative.   Gastrointestinal: Negative.   Endocrine: Negative.   Genitourinary: Negative.   Musculoskeletal: Positive for arthralgias, back pain, neck pain and neck stiffness.  Skin: Negative.   Allergic/Immunologic: Negative.   Neurological: Positive for dizziness and headaches.  Psychiatric/Behavioral: The patient is nervous/anxious.   All other systems reviewed and are negative.      Objective:   Physical Exam Vitals and nursing note reviewed.  Constitutional:      Appearance: Normal appearance. She is obese.  Cardiovascular:     Rate and Rhythm: Normal rate and regular rhythm.     Pulses: Normal pulses.     Heart sounds: Normal heart sounds.  Pulmonary:     Effort: Pulmonary effort is normal.     Breath sounds: Normal breath sounds.  Musculoskeletal:     Cervical back: Normal range of motion and neck supple.     Comments: Normal Muscle Bulk and Muscle Testing Reveals:  Upper Extremities:Full  ROM and  Muscle Strength 5/5  Lumbar Paraspinal Tenderness: L-3-L-5 Bilateral Greater Trochanter Tenderness Lower Extremities: Full ROM and Muscle Strength 5/5 Arises from chair with ease Narrow Based Gait   Skin:    General: Skin is warm and dry.  Neurological:     Mental Status: She is alert and oriented to person, place, and time.  Psychiatric:        Mood and Affect: Mood normal.        Behavior: Behavior normal.           Assessment & Plan:  1. Lumbago/ Lumbar Spondylosis/LeftLumbar Radiculitis: Continue to Monitor.09/15/2019. Refilled: HYDROcodone 10/325mg  one tablet every8hours as needed #90.  We will continue the opioid monitoring program, this consists of regular clinic visits, examinations, urine drug screen, pill counts as well as use of [place] Controlled Substance Reporting System. 2. Fibromyalgia. Continue Current exercise Regime.09/15/2019 3. Anxiety and depression:Psychiatry [facility]. Continue Counseling at [facility].09/15/2019 4. Migraines: On Maxalt. Neurology Following.09/15/2019 5. OSA : PCP Following. Continue to Monitor.  6. Obesity: Following Healthy Diet Regimen and Continue HEP as Tolerated. 09/15/2019 7. Status Post Cervical Spinal Fusion: C5C6- C-6- C-7 Anterior Cervical Discectomy Fusion and Allograft Plate:Dr. Lorin Mercy Following.09/15/2019 8. Cervicalgia/ Cervical Radiculitis/ S/P Cervical Spinal Fusion: Continueto MonitorDr. Yates Following.09/15/2019 9. Muscle Spasm: Continue Tizanidine as needed.09/15/2019 10.Hereditary and Idiopathic Peripheral Neuropathy Continue with Tens Unit.09/15/2019 11. Bilateral Greater Trochanteric Bursitis: Continue to Alternate Ice andheat Therapy. Continue to Monitor.09/15/2019. 12. Chronic Bilateral Knee Pain: No Complaints Today. Continue HEP as Tolerated. Continue to Monitor. 09/15/2019  8minutes of face to face patient care time was spent during this  visit. All questions were encouraged and answered.  F/U in 1 month

## 2019-09-20 MED FILL — HYDROCODON-APAP 10-325: 10-325 | 10 days supply | Qty: 30 | Fill #0

## 2019-09-22 ENCOUNTER — Ambulatory Visit: Payer: Medicare Other

## 2019-09-27 ENCOUNTER — Telehealth: Payer: Self-pay | Admitting: Registered Nurse

## 2019-09-27 ENCOUNTER — Telehealth: Payer: Self-pay | Admitting: *Deleted

## 2019-09-27 NOTE — Telephone Encounter (Signed)
Karina Greer is having problems getting her hydrocodone filled.  Her neurologist put her on clonazepam and tegretol.  She is not taking either one.  She is not tolerant of the clonazepam "AT ALL".  But now her insurance is preventing her from filling her pain med because of the benzo Rx.  She had to pay for 30 hydrocodone out of pocket, and now Karina Greer needs to call the pharmacy so that she can get the rest of her Rx.  She got #30 on 09/20/19 of the #90.

## 2019-09-27 NOTE — Telephone Encounter (Signed)
Placed a call to [facility] and spoke with Manuela Schwartz the pharmacist. She stated Ms. [employer] insurance denied her hydrocodone because she's prescribe clonazepam. Placed a call to Ms. [employer], the representative stating she has sent a cover review request form in. Also asked me to call [facility] to speak with pharmacist to call their pharmacy help desk.  Total time on phone with the insurance company 37 minutes.

## 2019-09-27 NOTE — Telephone Encounter (Signed)
Placed a call to [name], she is aware this provider spoke with her insurance company and the pharmacist also will be calling [facility] help desk. Her insurance keeps denying her Hydrocodone prescription, she verbalizes understanding. Will keep her posted on the above matter, she verbalizes understanding.

## 2019-09-28 ENCOUNTER — Telehealth: Payer: Self-pay

## 2019-09-28 MED FILL — HYDROCODON-APAP 10-325: 10-325 | 20 days supply | Qty: 60 | Fill #1

## 2019-09-28 NOTE — Telephone Encounter (Signed)
Confirmed appointment with patient. klh °

## 2019-09-29 ENCOUNTER — Ambulatory Visit (INDEPENDENT_AMBULATORY_CARE_PROVIDER_SITE_OTHER): Payer: Medicare Other

## 2019-09-29 ENCOUNTER — Other Ambulatory Visit: Payer: Self-pay

## 2019-09-29 DIAGNOSIS — J301 Allergic rhinitis due to pollen: Secondary | ICD-10-CM

## 2019-09-29 DIAGNOSIS — G4733 Obstructive sleep apnea (adult) (pediatric): Secondary | ICD-10-CM

## 2019-09-29 NOTE — Progress Notes (Signed)
95 percentile pressure 11   95th percentile leak 6.5   apnea index 0.3 /hr  apnea-hypopnea index  0.3 /hr   total days used  >4 hr 1 days  total days used <4 hr 4 days  Total compliance 3 percent  Wants to wear more will see about decreasing pressure

## 2019-10-07 DIAGNOSIS — Z20822 Contact with and (suspected) exposure to covid-19: Secondary | ICD-10-CM | POA: Diagnosis not present

## 2019-10-13 DIAGNOSIS — G500 Trigeminal neuralgia: Secondary | ICD-10-CM | POA: Diagnosis not present

## 2019-10-13 DIAGNOSIS — G521 Disorders of glossopharyngeal nerve: Secondary | ICD-10-CM | POA: Insufficient documentation

## 2019-10-14 ENCOUNTER — Ambulatory Visit: Payer: Medicare Other

## 2019-10-15 ENCOUNTER — Encounter: Payer: Self-pay | Admitting: Registered Nurse

## 2019-10-15 ENCOUNTER — Encounter: Payer: Medicare Other | Attending: Physical Medicine & Rehabilitation | Admitting: Registered Nurse

## 2019-10-15 ENCOUNTER — Other Ambulatory Visit: Payer: Self-pay

## 2019-10-15 VITALS — BP 136/81 | HR 79 | Temp 98.3°F | Ht 63.0 in | Wt 239.6 lb

## 2019-10-15 DIAGNOSIS — N390 Urinary tract infection, site not specified: Secondary | ICD-10-CM | POA: Diagnosis not present

## 2019-10-15 DIAGNOSIS — R200 Anesthesia of skin: Secondary | ICD-10-CM | POA: Diagnosis not present

## 2019-10-15 DIAGNOSIS — M797 Fibromyalgia: Secondary | ICD-10-CM | POA: Diagnosis not present

## 2019-10-15 DIAGNOSIS — Z5181 Encounter for therapeutic drug level monitoring: Secondary | ICD-10-CM | POA: Diagnosis not present

## 2019-10-15 DIAGNOSIS — M7062 Trochanteric bursitis, left hip: Secondary | ICD-10-CM

## 2019-10-15 DIAGNOSIS — Z981 Arthrodesis status: Secondary | ICD-10-CM | POA: Diagnosis not present

## 2019-10-15 DIAGNOSIS — Z79899 Other long term (current) drug therapy: Secondary | ICD-10-CM

## 2019-10-15 DIAGNOSIS — M7061 Trochanteric bursitis, right hip: Secondary | ICD-10-CM

## 2019-10-15 DIAGNOSIS — M47816 Spondylosis without myelopathy or radiculopathy, lumbar region: Secondary | ICD-10-CM

## 2019-10-15 DIAGNOSIS — G894 Chronic pain syndrome: Secondary | ICD-10-CM

## 2019-10-15 DIAGNOSIS — M542 Cervicalgia: Secondary | ICD-10-CM | POA: Diagnosis not present

## 2019-10-15 DIAGNOSIS — R202 Paresthesia of skin: Secondary | ICD-10-CM | POA: Diagnosis not present

## 2019-10-15 MED ORDER — HYDROCODONE-ACETAMINOPHEN 10-325 MG PO TABS: ORAL_TABLET | ORAL | 0 refills | Status: DC

## 2019-10-15 NOTE — Progress Notes (Signed)
Subjective:    Patient ID: Karina Greer, female    DOB: [birth year], 54 y.o.   MRN: [id number]  HPI: Karina Greer is a 54 y.o. female who returns for follow up appointment for chronic pain and medication refill. She states her pain is located in her lower back pain radiating into her left hip and left lower extremity. She rates her pain 5. Her current exercise regime is walking and performing stretching exercises.  [name] Morphine equivalent is 30.00  MME.  Last UDS was Performed on 08/18/2019, it was consistent.   Pain Inventory Average Pain 9 Pain Right Now 5 My pain is sharp, stabbing and tingling  In the last 24 hours, has pain interfered with the following? General activity 5 Relation with others 6 Enjoyment of life 6 What TIME of day is your pain at its worst? all Sleep (in general) Fair  Pain is worse with: walking, bending, sitting, inactivity, standing and some activites Pain improves with: rest, heat/ice, therapy/exercise, pacing activities, medication, TENS and injections Relief from Meds: 5  Mobility walk with assistance how many minutes can you walk? 15 ability to climb steps?  yes do you drive?  yes  Function disabled: date disabled 2005 I need assistance with the following:  meal prep, household duties and shopping  Neuro/Psych anxiety  Prior Studies Any changes since last visit?  no  Physicians involved in your care Any changes since last visit?  yes Neurosurgeon Dr. Arlan Organ Dr. Vallarie Mare   Family History  Problem Relation Age of Onset  . Hypertension Mother   . Diabetes Father   . Brain cancer Father   . Fibromyalgia Sister   . Suicidality Sister   . Hypertension Other        Cancer, Cerebrovascular disease run on mother side of family   Social History   Socioeconomic History  . Marital status: Divorced    Spouse name: Not on file  . Number of children: 3  . Years of education: HS  . Highest education level: 12th grade  Occupational  History  . Occupation: Merchandiser, retail: UNEMPLOYED    Comment: Disability  Tobacco Use  . Smoking status: Never Smoker  . Smokeless tobacco: Never Used  Substance and Sexual Activity  . Alcohol use: No  . Drug use: No  . Sexual activity: Not on file  Other Topics Concern  . Not on file  Social History Narrative   Patient is right-handed. She avoids caffeine. She has recently been using the treadmill.   Social Determinants of Health   Financial Resource Strain:   . Difficulty of Paying Living Expenses: Not on file  Food Insecurity:   . Worried About Charity fundraiser in the Last Year: Not on file  . Ran Out of Food in the Last Year: Not on file  Transportation Needs:   . Lack of Transportation (Medical): Not on file  . Lack of Transportation (Non-Medical): Not on file  Physical Activity:   . Days of Exercise per Week: Not on file  . Minutes of Exercise per Session: Not on file  Stress:   . Feeling of Stress : Not on file  Social Connections:   . Frequency of Communication with Friends and Family: Not on file  . Frequency of Social Gatherings with Friends and Family: Not on file  . Attends Religious Services: Not on file  . Active Member of Clubs or Organizations: Not on file  . Attends Club or  Organization Meetings: Not on file  . Marital Status: Not on file   Past Surgical History:  Procedure Laterality Date  . ACHILLES TENDON SURGERY Right 3/16  . ANTERIOR CERVICAL DECOMP/DISCECTOMY FUSION N/A 03/11/2016   Procedure: C5-6, C6-7 Anterior Cervical Discectomy and Fusion, Allograft, Plate;  Surgeon: Marybelle Killings, MD;  Location: [place];  Service: Orthopedics;  Laterality: N/A;  . CHOLECYSTECTOMY  2014  . COLONOSCOPY WITH PROPOFOL N/A 08/25/2017   Procedure: COLONOSCOPY WITH PROPOFOL;  Surgeon: Manya Silvas, MD;  Location: [facility] ENDOSCOPY;  Service: Endoscopy;  Laterality: N/A;  . CYSTO WITH HYDRODISTENSION N/A 01/26/2014   Procedure: CYSTOSCOPY/HYDRODISTENSION;   Surgeon: Bernestine Amass, MD;  Location: [facility];  Service: Urology;  Laterality: N/A;  . CYSTO/  URETHRAL DILATION/  HYDRODISTENTION/   INSTILLATION THERAPY  07-16-2010//   12-30-2007//   10-27-2006  . EXERCISE TOLERENCE TEST  10-12-2010   NEGATIVE  ADEQUATE ETT/  NO ISCHEMIA OR EVIDENCE HIGH GRADE OBSTRUCTIVE CAD/  NO FURTHER TEST NEEDED  . [place] ENDOMETRIAL ABLATION  2014  . LAPAROSCOPIC OVARIAN CYST BX  2005   AND  URETEROSCOPIC LASER LITHO  STONE EXTRACTION  . SHOULDER OPEN ROTATOR CUFF REPAIR Right 2004  . TONSILLECTOMY    . TRANSTHORACIC ECHOCARDIOGRAM  06-07-2006   normal study/  ef 60-65%  . TUBAL LIGATION Bilateral 1995   Past Medical History:  Diagnosis Date  . Anxiety   . Anxiety disorder   . Arthritis   . Blepharitis   . Chronic low back pain   . Depression   . Dyslipidemia   . Fibromyalgia   . GERD (gastroesophageal reflux disease)   . Headache    hx migraines  . History of kidney stones   . History of panic attacks   . History of renal calculi   . IBS (irritable bowel syndrome)   . Interstitial cystitis   . Lupus ([place])    tested positive for antibodies for lupus. dr to do further tests  . Menorrhagia   . OSA on CPAP    not used cpap for several weeks  . Pneumonia    hx  . PONV (postoperative nausea and vomiting)   . RLS (restless legs syndrome)   . Rosacea   . Seasonal asthma   . SI (sacroiliac) joint dysfunction   . SUI (stress urinary incontinence, female)   . UTI (lower urinary tract infection)    hx  . 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 02/23/2013   BP 136/81   Pulse 79   Temp 98.3 F (36.8 C)   Ht 5\' 3"  (1.6 m)   Wt 239 lb 9.6 oz (108.7 kg)   SpO2 96%   BMI 42.44 kg/m   Opioid Risk Score:   Fall Risk Score:  `1  Depression screen PHQ 2/9  Depression screen [facility] 2/9 08/18/2019 06/21/2019 08/12/2018 07/20/2018 06/19/2018 04/20/2018 01/29/2018  Decreased Interest 2 0 0 1 1 0 0  Down, Depressed,  Hopeless 2 0 0 1 1 0 0  PHQ - 2 Score 4 0 0 2 2 0 0  Altered sleeping - - - - - - -  Tired, decreased energy - - - - - - -  Change in appetite - - - - - - -  Feeling bad or failure about yourself  - - - - - - -  Trouble concentrating - - - - - - -  Moving slowly or fidgety/restless - - - - - - -  Suicidal thoughts - - - - - - -  PHQ-9 Score - - - - - - -  Difficult doing work/chores - - - - - - -  Some recent data might be hidden    Review of Systems     Objective:   Physical Exam Vitals and nursing note reviewed.  Constitutional:      Appearance: Normal appearance. She is obese.  Neck:     Comments: Cervical Paraspinal Tenderness: C-5-C-6 Cardiovascular:     Rate and Rhythm: Normal rate and regular rhythm.     Pulses: Normal pulses.     Heart sounds: Normal heart sounds.  Pulmonary:     Effort: Pulmonary effort is normal.     Breath sounds: Normal breath sounds.  Musculoskeletal:     Comments: Normal Muscle Bulk and Muscle Testing Reveals:  Upper Extremities: Full ROM and Muscle Strength 5/5  Lumbar Paraspinal Tenderness: L-3-L-5  Left Greater Trochanter Tenderness Lower Extremities: Full ROM and Muscle Strength 5/5  Arises from chair slowly  Narrow Based Gait   Skin:    General: Skin is warm and dry.  Neurological:     Mental Status: She is alert and oriented to person, place, and time.  Psychiatric:        Mood and Affect: Mood normal.        Behavior: Behavior normal.           Assessment & Plan:  1. Lumbago/ Lumbar Spondylosis/LeftLumbar Radiculitis: Continue to Monitor.10/15/2019. Refilled: HYDROcodone 10/325mg  one tablet every8hours as needed #90.  We will continue the opioid monitoring program, this consists of regular clinic visits, examinations, urine drug screen, pill counts as well as use of [place] Controlled Substance Reporting System. 2. Fibromyalgia. Continue Current exercise Regime.10/14/2019 3. Anxiety and depression:Psychiatry  [facility]. Continue Counseling at [facility].10/15/2019 4. Migraines: On Maxalt. Neurology Following.10/15/2019 5. OSA : PCP Following. Continue to Monitor. 10/15/2019 6. Obesity: Following Healthy Diet Regimen and Continue HEP as Tolerated. 10/15/2019 7. Status Post Cervical Spinal Fusion: C5C6- C-6- C-7 Anterior Cervical Discectomy Fusion and Allograft Plate:Dr. Lorin Mercy Following.10/15/2019 8. Cervicalgia/ Cervical Radiculitis/ S/P Cervical Spinal Fusion: Continueto MonitorDr. Lorin Mercy Following.10/15/2019 9. Muscle Spasm: Continue Tizanidine as needed.10/15/2019 10.Hereditary and Idiopathic Peripheral Neuropathy Continue with Tens Unit.10/15/2019. 11.Left Greater Trochanteric Bursitis: Continue to Alternate Ice andheat Therapy. Continue to Monitor.10/15/2019. 12. Chronic Bilateral Knee Pain: No Complaints Today. Continue HEP as Tolerated. Continue to Monitor.10/15/2019  78minutes of face to face patient care time was spent during this visit. All questions were encouraged and answered.  F/U in 1 month

## 2019-10-20 ENCOUNTER — Other Ambulatory Visit: Payer: Self-pay

## 2019-10-20 ENCOUNTER — Ambulatory Visit (INDEPENDENT_AMBULATORY_CARE_PROVIDER_SITE_OTHER): Payer: Medicare Other

## 2019-10-20 DIAGNOSIS — J301 Allergic rhinitis due to pollen: Secondary | ICD-10-CM

## 2019-10-20 MED FILL — HYDROCODON-APAP 10-325: 10-325 | 30 days supply | Qty: 90 | Fill #0

## 2019-10-28 DIAGNOSIS — F332 Major depressive disorder, recurrent severe without psychotic features: Secondary | ICD-10-CM | POA: Diagnosis not present

## 2019-10-28 DIAGNOSIS — Z1331 Encounter for screening for depression: Secondary | ICD-10-CM | POA: Diagnosis not present

## 2019-10-28 DIAGNOSIS — Z1231 Encounter for screening mammogram for malignant neoplasm of breast: Secondary | ICD-10-CM | POA: Diagnosis not present

## 2019-10-28 DIAGNOSIS — E7800 Pure hypercholesterolemia, unspecified: Secondary | ICD-10-CM | POA: Diagnosis not present

## 2019-10-28 DIAGNOSIS — G43009 Migraine without aura, not intractable, without status migrainosus: Secondary | ICD-10-CM | POA: Diagnosis not present

## 2019-10-28 DIAGNOSIS — M5481 Occipital neuralgia: Secondary | ICD-10-CM | POA: Diagnosis not present

## 2019-10-28 DIAGNOSIS — R7303 Prediabetes: Secondary | ICD-10-CM | POA: Diagnosis not present

## 2019-10-28 DIAGNOSIS — F411 Generalized anxiety disorder: Secondary | ICD-10-CM | POA: Diagnosis not present

## 2019-10-28 DIAGNOSIS — Z9181 History of falling: Secondary | ICD-10-CM | POA: Diagnosis not present

## 2019-10-28 DIAGNOSIS — E785 Hyperlipidemia, unspecified: Secondary | ICD-10-CM | POA: Diagnosis not present

## 2019-10-28 DIAGNOSIS — N959 Unspecified menopausal and perimenopausal disorder: Secondary | ICD-10-CM | POA: Diagnosis not present

## 2019-10-28 DIAGNOSIS — Z0000 Encounter for general adult medical examination without abnormal findings: Secondary | ICD-10-CM | POA: Diagnosis not present

## 2019-10-28 DIAGNOSIS — Z6841 Body Mass Index (BMI) 40.0 and over, adult: Secondary | ICD-10-CM | POA: Diagnosis not present

## 2019-10-28 DIAGNOSIS — K219 Gastro-esophageal reflux disease without esophagitis: Secondary | ICD-10-CM | POA: Diagnosis not present

## 2019-11-03 ENCOUNTER — Ambulatory Visit (INDEPENDENT_AMBULATORY_CARE_PROVIDER_SITE_OTHER): Payer: Medicare Other

## 2019-11-03 ENCOUNTER — Other Ambulatory Visit: Payer: Self-pay

## 2019-11-03 DIAGNOSIS — J301 Allergic rhinitis due to pollen: Secondary | ICD-10-CM

## 2019-11-08 ENCOUNTER — Telehealth: Payer: Self-pay

## 2019-11-08 NOTE — Telephone Encounter (Signed)
Called [facility] informing patient of appointment on 11/10/2019. klh 

## 2019-11-10 ENCOUNTER — Ambulatory Visit: Payer: Medicare Other

## 2019-11-10 DIAGNOSIS — N3010 Interstitial cystitis (chronic) without hematuria: Secondary | ICD-10-CM | POA: Diagnosis not present

## 2019-11-10 DIAGNOSIS — Z6841 Body Mass Index (BMI) 40.0 and over, adult: Secondary | ICD-10-CM | POA: Diagnosis not present

## 2019-11-10 DIAGNOSIS — N390 Urinary tract infection, site not specified: Secondary | ICD-10-CM | POA: Diagnosis not present

## 2019-11-11 ENCOUNTER — Other Ambulatory Visit: Payer: Self-pay | Admitting: Registered Nurse

## 2019-11-11 DIAGNOSIS — F411 Generalized anxiety disorder: Secondary | ICD-10-CM | POA: Diagnosis not present

## 2019-11-11 DIAGNOSIS — F332 Major depressive disorder, recurrent severe without psychotic features: Secondary | ICD-10-CM | POA: Diagnosis not present

## 2019-11-15 ENCOUNTER — Ambulatory Visit: Payer: Medicare Other

## 2019-11-15 ENCOUNTER — Ambulatory Visit: Payer: Medicare Other | Admitting: Internal Medicine

## 2019-11-15 ENCOUNTER — Telehealth: Payer: Self-pay

## 2019-11-15 NOTE — Telephone Encounter (Signed)
Screened and confirmed cpap download appt

## 2019-11-16 ENCOUNTER — Other Ambulatory Visit: Payer: Self-pay

## 2019-11-16 ENCOUNTER — Encounter: Payer: Self-pay | Admitting: Registered Nurse

## 2019-11-16 ENCOUNTER — Encounter: Payer: Medicare Other | Attending: Physical Medicine & Rehabilitation | Admitting: Registered Nurse

## 2019-11-16 VITALS — BP 129/84 | HR 72 | Temp 97.7°F | Ht 63.0 in | Wt 240.0 lb

## 2019-11-16 DIAGNOSIS — M7062 Trochanteric bursitis, left hip: Secondary | ICD-10-CM | POA: Diagnosis not present

## 2019-11-16 DIAGNOSIS — M47816 Spondylosis without myelopathy or radiculopathy, lumbar region: Secondary | ICD-10-CM | POA: Diagnosis not present

## 2019-11-16 DIAGNOSIS — M797 Fibromyalgia: Secondary | ICD-10-CM | POA: Insufficient documentation

## 2019-11-16 DIAGNOSIS — G894 Chronic pain syndrome: Secondary | ICD-10-CM | POA: Insufficient documentation

## 2019-11-16 DIAGNOSIS — R202 Paresthesia of skin: Secondary | ICD-10-CM | POA: Insufficient documentation

## 2019-11-16 DIAGNOSIS — M5416 Radiculopathy, lumbar region: Secondary | ICD-10-CM | POA: Diagnosis not present

## 2019-11-16 DIAGNOSIS — Z5181 Encounter for therapeutic drug level monitoring: Secondary | ICD-10-CM | POA: Diagnosis not present

## 2019-11-16 DIAGNOSIS — N390 Urinary tract infection, site not specified: Secondary | ICD-10-CM | POA: Diagnosis not present

## 2019-11-16 DIAGNOSIS — Z79899 Other long term (current) drug therapy: Secondary | ICD-10-CM | POA: Insufficient documentation

## 2019-11-16 DIAGNOSIS — R200 Anesthesia of skin: Secondary | ICD-10-CM | POA: Insufficient documentation

## 2019-11-16 MED ORDER — HYDROCODONE-ACETAMINOPHEN 10-325 MG PO TABS: ORAL_TABLET | ORAL | 0 refills | Status: DC

## 2019-11-16 NOTE — Progress Notes (Signed)
Subjective:    Patient ID: Karina Greer, female    DOB: 1966-01-20, 54 y.o.   MRN: [id number]  HPI: Karina Greer is a 54 y.o. female who returns for follow up appointment for chronic pain and medication refill. She states her pain is located in her lower back radiating into her left hip and left lower extremity. Also report increase intensity of left hip pain, she refuses X-ray at this time. States she will call Dr. Lorin Mercy, she was instructed to call or send a My Chart message if she is unable to get an appointment, she verbalizes understanding. Spoke with Dr. Octavio Manns regarding the above, she is willing to give Karina Greer the  injection this was conveyed to Karina Greer she will call office with her decision.  She rates her pain 6. Her current exercise regime is walking and performing stretching exercises.  Karina Greer is 30.00 MME.  Last UDS was Performed on 11/25/ 2020, it was consistent.    Pain Inventory Average Pain 9 Pain Right Now 6 My pain is constant, sharp and stabbing  In the last 24 hours, has pain interfered with the following? General activity 6 Relation with others 6 Enjoyment of life 6 What TIME of day is your pain at its worst? all Sleep (in general) Fair  Pain is worse with: walking, bending, sitting, inactivity, standing and some activites Pain improves with: rest, heat/ice, therapy/exercise, pacing activities, medication and TENS Relief from Meds: 6  Mobility walk without assistance walk with assistance ability to climb steps?  yes do you drive?  yes Do you have any goals in this area?  yes  Function disabled: date disabled , I need assistance with the following:  household duties and shopping  Neuro/Psych anxiety  Prior Studies Any changes since last visit?  no  Physicians involved in your care Any changes since last visit?  no   Family History  Problem Relation Age of Onset  . Hypertension Mother   . Diabetes Father   .  Brain cancer Father   . Fibromyalgia Sister   . Suicidality Sister   . Hypertension Other        Cancer, Cerebrovascular disease run on mother side of family   Social History   Socioeconomic History  . Marital status: Divorced    Spouse name: Not on file  . Number of children: 3  . Years of education: HS  . Highest education level: 12th grade  Occupational History  . Occupation: Merchandiser, retail: UNEMPLOYED    Comment: Disability  Tobacco Use  . Smoking status: Never Smoker  . Smokeless tobacco: Never Used  Substance and Sexual Activity  . Alcohol use: No  . Drug use: No  . Sexual activity: Not on file  Other Topics Concern  . Not on file  Social History Narrative   Patient is right-handed. She avoids caffeine. She has recently been using the treadmill.   Social Determinants of Health   Financial Resource Strain:   . Difficulty of Paying Living Expenses: Not on file  Food Insecurity:   . Worried About Charity fundraiser in the Last Year: Not on file  . Ran Out of Food in the Last Year: Not on file  Transportation Needs:   . Lack of Transportation (Medical): Not on file  . Lack of Transportation (Non-Medical): Not on file  Physical Activity:   . Days of Exercise per Week: Not on file  . Minutes of  Exercise per Session: Not on file  Stress:   . Feeling of Stress : Not on file  Social Connections:   . Frequency of Communication with Friends and Family: Not on file  . Frequency of Social Gatherings with Friends and Family: Not on file  . Attends Religious Services: Not on file  . Active Member of Clubs or Organizations: Not on file  . Attends Archivist Meetings: Not on file  . Marital Status: Not on file   Past Surgical History:  Procedure Laterality Date  . ACHILLES TENDON SURGERY Right 3/16  . ANTERIOR CERVICAL DECOMP/DISCECTOMY FUSION N/A 03/11/2016   Procedure: C5-6, C6-7 Anterior Cervical Discectomy and Fusion, Allograft, Plate;  Surgeon:  Marybelle Killings, MD;  Location: [place];  Service: Orthopedics;  Laterality: N/A;  . CHOLECYSTECTOMY  2014  . COLONOSCOPY WITH PROPOFOL N/A 08/25/2017   Procedure: COLONOSCOPY WITH PROPOFOL;  Surgeon: Manya Silvas, MD;  Location: [facility] ENDOSCOPY;  Service: Endoscopy;  Laterality: N/A;  . CYSTO WITH HYDRODISTENSION N/A 01/26/2014   Procedure: CYSTOSCOPY/HYDRODISTENSION;  Surgeon: Bernestine Amass, MD;  Location: [facility];  Service: Urology;  Laterality: N/A;  . CYSTO/  URETHRAL DILATION/  HYDRODISTENTION/   INSTILLATION THERAPY  07-16-2010//   12-30-2007//   10-27-2006  . EXERCISE TOLERENCE TEST  10-12-2010   NEGATIVE  ADEQUATE ETT/  NO ISCHEMIA OR EVIDENCE HIGH GRADE OBSTRUCTIVE CAD/  NO FURTHER TEST NEEDED  . [place] ENDOMETRIAL ABLATION  2014  . LAPAROSCOPIC OVARIAN CYST BX  2005   AND  URETEROSCOPIC LASER LITHO  STONE EXTRACTION  . SHOULDER OPEN ROTATOR CUFF REPAIR Right 2004  . TONSILLECTOMY    . TRANSTHORACIC ECHOCARDIOGRAM  06-07-2006   normal study/  ef 60-65%  . TUBAL LIGATION Bilateral 1995   Past Medical History:  Diagnosis Date  . Anxiety   . Anxiety disorder   . Arthritis   . Blepharitis   . Chronic low back pain   . Depression   . Dyslipidemia   . Fibromyalgia   . GERD (gastroesophageal reflux disease)   . Headache    hx migraines  . History of kidney stones   . History of panic attacks   . History of renal calculi   . IBS (irritable bowel syndrome)   . Interstitial cystitis   . Lupus ([place])    tested positive for antibodies for lupus. dr to do further tests  . Menorrhagia   . OSA on CPAP    not used cpap for several weeks  . Pneumonia    hx  . PONV (postoperative nausea and vomiting)   . RLS (restless legs syndrome)   . Rosacea   . Seasonal asthma   . SI (sacroiliac) joint dysfunction   . SUI (stress urinary incontinence, female)   . UTI (lower urinary tract infection)    hx  . 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  02/23/2013   BP 129/84   Pulse 72   Temp 97.7 F (36.5 C)   Ht 5\' 3"  (1.6 m)   Wt 240 lb (108.9 kg)   SpO2 96%   BMI 42.51 kg/m   Opioid Risk Score:   Fall Risk Score:  `1  Depression screen PHQ 2/9  Depression screen [facility] 2/9 08/18/2019 06/21/2019 08/12/2018 07/20/2018 06/19/2018 04/20/2018 01/29/2018  Decreased Interest 2 0 0 1 1 0 0  Down, Depressed, Hopeless 2 0 0 1 1 0 0  PHQ - 2 Score 4 0 0 2  2 0 0  Altered sleeping - - - - - - -  Tired, decreased energy - - - - - - -  Change in appetite - - - - - - -  Feeling bad or failure about yourself  - - - - - - -  Trouble concentrating - - - - - - -  Moving slowly or fidgety/restless - - - - - - -  Suicidal thoughts - - - - - - -  PHQ-9 Score - - - - - - -  Difficult doing work/chores - - - - - - -  Some recent data might be hidden    Review of Systems  Constitutional: Negative.   HENT: Negative.   Eyes: Negative.   Respiratory: Negative.   Cardiovascular: Negative.   Gastrointestinal: Negative.   Endocrine: Negative.   Genitourinary: Negative.   Musculoskeletal: Positive for arthralgias, back pain, gait problem, myalgias, neck pain and neck stiffness.  Skin: Negative.   Allergic/Immunologic: Negative.   Hematological: Negative.   Psychiatric/Behavioral: The patient is nervous/anxious.   All other systems reviewed and are negative.      Objective:   Physical Exam Vitals and nursing note reviewed.  Constitutional:      Appearance: Normal appearance. She is obese.  Cardiovascular:     Rate and Rhythm: Normal rate and regular rhythm.     Pulses: Normal pulses.     Heart sounds: Normal heart sounds.  Pulmonary:     Effort: Pulmonary effort is normal.     Breath sounds: Normal breath sounds.  Musculoskeletal:     Cervical back: Normal range of motion and neck supple.     Comments: Normal Muscle Bulk and Muscle Testing Reveals:  Upper Extremities: Full ROM and Muscle Strength 5/5  Lumbar Paraspinal Tenderness: L-3-  L-5 Left Greater Trochanter Tenderness Lower Extremities: Full ROM and Muscle Strength 5/5 Arises from Table with ease Narrow Based  Gait   Skin:    General: Skin is warm and dry.  Neurological:     Mental Status: She is alert and oriented to person, place, and time.  Psychiatric:        Mood and Affect: Mood normal.        Behavior: Behavior normal.           Assessment & Plan:  1. Lumbago/ Lumbar Spondylosis/LeftLumbar Radiculitis: Continue to Monitor.11/16/2019. Refilled: HYDROcodone 10/325mg  one tablet every8hours as needed #90.  We will continue the opioid monitoring program, this consists of regular clinic visits, examinations, urine drug screen, pill counts as well as use of [place] Controlled Substance Reporting System. 2. Fibromyalgia. Continue Current exercise Regime.11/16/2019 3. Anxiety and depression:Psychiatry [facility]. Continue Counseling at [facility].11/16/2019 4. Migraines: On Maxalt. Neurology Following.11/16/2019 5. OSA :PCP Following. Continue to Monitor.11/16/2019 6. Obesity: Following Healthy Diet Regimenand Continue HEP as Tolerated. 11/16/2019 7. Status Post Cervical Spinal Fusion: C5C6- C-6- C-7 Anterior Cervical Discectomy Fusion and Allograft Plate:Dr. Lorin Mercy Following.11/16/2019 8. Cervicalgia/ Cervical Radiculitis/ S/P Cervical Spinal Fusion: Continueto MonitorDr. Yates Following.11/16/2019 9. Muscle Spasm: Continue Tizanidine as needed.11/16/2019 10.Hereditary and Idiopathic Peripheral Neuropathy Continue with Tens Unit.11/16/2019. 11.Left Greater Trochanteric Bursitis: Karina Greer instructed to call or send a My chart message if she would like to receive Cortisone Injection, she verbalizes understanding. Continue to Alternate Ice andheat Therapy. Continue to Monitor.11/16/2019. 12. Chronic Bilateral Knee Pain:No Complaints Today.Continue HEP as Tolerated. Continue to  Monitor.11/16/2019  42minutes of face to face patient care time was spent during this  visit. All questions were encouraged and answered.  F/U in 1 month

## 2019-11-17 ENCOUNTER — Ambulatory Visit (INDEPENDENT_AMBULATORY_CARE_PROVIDER_SITE_OTHER): Payer: Medicare Other

## 2019-11-17 DIAGNOSIS — G4733 Obstructive sleep apnea (adult) (pediatric): Secondary | ICD-10-CM | POA: Diagnosis not present

## 2019-11-17 DIAGNOSIS — J301 Allergic rhinitis due to pollen: Secondary | ICD-10-CM

## 2019-11-17 NOTE — Progress Notes (Signed)
95 percentile pressure 9   95th percentile leak 5.3   apnea index 0.9 /hr  apnea-hypopnea index  0.9 /hr   total days used  >4 hr 3 days  total days used <4 hr 5 days  Total compliance 10 percent  States her rosacea flared up and unable to wear cpap. She also wants to try the x-sm pillows.  I will send her

## 2019-11-19 MED FILL — HYDROCODON-APAP 10-325: 10-325 | 30 days supply | Qty: 90 | Fill #0

## 2019-11-23 DIAGNOSIS — Z510 Encounter for antineoplastic radiation therapy: Secondary | ICD-10-CM | POA: Diagnosis not present

## 2019-11-23 DIAGNOSIS — G521 Disorders of glossopharyngeal nerve: Secondary | ICD-10-CM | POA: Diagnosis not present

## 2019-11-29 ENCOUNTER — Telehealth: Payer: Self-pay

## 2019-11-29 NOTE — Telephone Encounter (Signed)
Called lmom informing patient of appointment on 12/01/2019. klh

## 2019-11-30 DIAGNOSIS — F411 Generalized anxiety disorder: Secondary | ICD-10-CM | POA: Diagnosis not present

## 2019-11-30 DIAGNOSIS — F332 Major depressive disorder, recurrent severe without psychotic features: Secondary | ICD-10-CM | POA: Diagnosis not present

## 2019-12-01 ENCOUNTER — Ambulatory Visit: Payer: Medicare Other

## 2019-12-01 ENCOUNTER — Ambulatory Visit: Payer: Medicare Other | Admitting: Adult Health

## 2019-12-06 ENCOUNTER — Telehealth: Payer: Self-pay

## 2019-12-06 NOTE — Telephone Encounter (Signed)
Confirmed appointment on 12/08/2019 and screened for covid. klh  

## 2019-12-08 ENCOUNTER — Other Ambulatory Visit: Payer: Self-pay

## 2019-12-08 ENCOUNTER — Encounter: Payer: Self-pay | Admitting: Adult Health

## 2019-12-08 ENCOUNTER — Ambulatory Visit (INDEPENDENT_AMBULATORY_CARE_PROVIDER_SITE_OTHER): Payer: Medicare Other | Admitting: Adult Health

## 2019-12-08 ENCOUNTER — Ambulatory Visit: Payer: Medicare Other

## 2019-12-08 VITALS — BP 149/87 | HR 71 | Temp 97.6°F | Resp 16 | Ht 63.0 in | Wt 238.4 lb

## 2019-12-08 DIAGNOSIS — J301 Allergic rhinitis due to pollen: Secondary | ICD-10-CM | POA: Diagnosis not present

## 2019-12-08 DIAGNOSIS — J45909 Unspecified asthma, uncomplicated: Secondary | ICD-10-CM | POA: Diagnosis not present

## 2019-12-08 DIAGNOSIS — Z6841 Body Mass Index (BMI) 40.0 and over, adult: Secondary | ICD-10-CM

## 2019-12-08 DIAGNOSIS — G4733 Obstructive sleep apnea (adult) (pediatric): Secondary | ICD-10-CM

## 2019-12-08 DIAGNOSIS — Z9989 Dependence on other enabling machines and devices: Secondary | ICD-10-CM | POA: Diagnosis not present

## 2019-12-08 MED ORDER — MONTELUKAST SODIUM 10 MG PO TABS: 10.0000 mg | ORAL_TABLET | Freq: Every day | ORAL | 3 refills | Status: DC

## 2019-12-08 NOTE — Progress Notes (Signed)
[facility], [place] [address]  Pulmonary Sleep Medicine   Office Visit Note  Patient Name: Karina Greer DOB: 1966-01-11 MRN [id number]  Date of Service: 12/08/2019  Complaints/HPI: pt is here for pulmonary follow up. She reports overall she is doing well.  She has not been wearing her cpap since she had the gamma knife procedure.  She is finally healed and she will start back wearing it this week.  She also reports some PND, and sinus pressure since last night.  We discussed  Taking allergy medications.   ROS  General: (-) fever, (-) chills, (-) night sweats, (-) weakness Skin: (-) rashes, (-) itching,. Eyes: (-) visual changes, (-) redness, (-) itching. Nose and Sinuses: (-) nasal stuffiness or itchiness, (-) postnasal drip, (-) nosebleeds, (-) sinus trouble. Mouth and Throat: (-) sore throat, (-) hoarseness. Neck: (-) swollen glands, (-) enlarged thyroid, (-) neck pain. Respiratory: - cough, (-) bloody sputum, - shortness of breath, - wheezing. Cardiovascular: - ankle swelling, (-) chest pain. Lymphatic: (-) lymph node enlargement. Neurologic: (-) numbness, (-) tingling. Psychiatric: (-) anxiety, (-) depression   Current Medication: Outpatient Encounter Medications as of 12/08/2019  Medication Sig Note  . albuterol (PROVENTIL) (2.5 MG/3ML) 0.083% nebulizer solution Take 3 mLs (2.5 mg total) by nebulization every 6 (six) hours as needed for wheezing or shortness of breath.   [name] azelastine (ASTELIN) 0.1 % nasal spray USE 2 SPRAYS INTO EACH NOSTRIL TWICE A DAY 06/30/2019: Prn   . b complex vitamins tablet Take 1 tablet by mouth daily.   [name] DEXILANT 30 MG capsule Take 1 capsule by mouth 2 (two) times daily.   [name] dicyclomine (BENTYL) 20 MG tablet Take 20 mg by mouth every 6 (six) hours.   [name] EPIPEN 2-PAK 0.3 MG/0.3ML SOAJ injection Use as directed   . fexofenadine (ALLEGRA) 180 MG tablet Take 180 mg by mouth daily.   [name] FLOVENT HFA 110 MCG/ACT  inhaler TAKE 1 PUFF BY MOUTH TWICE A DAY 06/30/2019: Prn   . HYDROcodone-acetaminophen (NORCO) 10-325 MG tablet TAKE 1 TABLET BY MOUTH EVERY 8 HOURS AS NEEDED FOR PAIN. Do Not feel before 11/17/2019   . meclizine (ANTIVERT) 25 MG tablet Take 1 tablet by mouth 3 (three) times daily as needed.   . montelukast (SINGULAIR) 10 MG tablet Take 1 tablet (10 mg total) by mouth at bedtime.   . ondansetron (ZOFRAN-ODT) 4 MG disintegrating tablet Take 1 tablet by mouth 3 (three) times daily as needed.   [name] PRESCRIPTION MEDICATION every 14 (fourteen) days. 2 ALLERGY INJECTIONS EVERY 2 WKS   . PROAIR HFA 108 (90 Base) MCG/ACT inhaler INHALE 2 PUFFS BY MOUTH EVERY 4 TO 6 HOURS AS NEEDED   . tiZANidine (ZANAFLEX) 2 MG tablet TAKE 1 TABLET BY MOUTH EVERY 8 HOURS AS NEEDED   . Vitamin D, Ergocalciferol, (DRISDOL) 50000 units CAPS capsule Take 50,000 Units by mouth every 7 (seven) days. THURSDAYS    No facility-administered encounter medications on file as of 12/08/2019.    Surgical History: Past Surgical History:  Procedure Laterality Date  . ACHILLES TENDON SURGERY Right 3/16  . ANTERIOR CERVICAL DECOMP/DISCECTOMY FUSION N/A 03/11/2016   Procedure: C5-6, C6-7 Anterior Cervical Discectomy and Fusion, Allograft, Plate;  Surgeon: Marybelle Killings, MD;  Location: [place];  Service: Orthopedics;  Laterality: N/A;  . CHOLECYSTECTOMY  2014  . COLONOSCOPY WITH PROPOFOL N/A 08/25/2017   Procedure: COLONOSCOPY WITH PROPOFOL;  Surgeon: Manya Silvas, MD;  Location: [facility] ENDOSCOPY;  Service: Endoscopy;  Laterality: N/A;  . CYSTO WITH HYDRODISTENSION N/A 01/26/2014   Procedure: CYSTOSCOPY/HYDRODISTENSION;  Surgeon: Bernestine Amass, MD;  Location: [facility];  Service: Urology;  Laterality: N/A;  . CYSTO/  URETHRAL DILATION/  HYDRODISTENTION/   INSTILLATION THERAPY  07-16-2010//   12-30-2007//   10-27-2006  . EXERCISE TOLERENCE TEST  10-12-2010   NEGATIVE  ADEQUATE ETT/  NO ISCHEMIA OR EVIDENCE HIGH GRADE  OBSTRUCTIVE CAD/  NO FURTHER TEST NEEDED  . [place] ENDOMETRIAL ABLATION  2014  . LAPAROSCOPIC OVARIAN CYST BX  2005   AND  URETEROSCOPIC LASER LITHO  STONE EXTRACTION  . SHOULDER OPEN ROTATOR CUFF REPAIR Right 2004  . TONSILLECTOMY    . TRANSTHORACIC ECHOCARDIOGRAM  06-07-2006   normal study/  ef 60-65%  . TUBAL LIGATION Bilateral 1995    Medical History: Past Medical History:  Diagnosis Date  . Anxiety   . Anxiety disorder   . Arthritis   . Blepharitis   . Chronic low back pain   . Depression   . Dyslipidemia   . Fibromyalgia   . GERD (gastroesophageal reflux disease)   . Headache    hx migraines  . History of kidney stones   . History of panic attacks   . History of renal calculi   . IBS (irritable bowel syndrome)   . Interstitial cystitis   . Lupus ([place])    tested positive for antibodies for lupus. dr to do further tests  . Menorrhagia   . OSA on CPAP    not used cpap for several weeks  . Pneumonia    hx  . PONV (postoperative nausea and vomiting)   . RLS (restless legs syndrome)   . Rosacea   . Seasonal asthma   . SI (sacroiliac) joint dysfunction   . SUI (stress urinary incontinence, female)   . UTI (lower urinary tract infection)    hx  . 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 02/23/2013    Family History: Family History  Problem Relation Age of Onset  . Hypertension Mother   . Diabetes Father   . Brain cancer Father   . Fibromyalgia Sister   . Suicidality Sister   . Hypertension Other        Cancer, Cerebrovascular disease run on mother side of family    Social History: Social History   Socioeconomic History  . Marital status: Divorced    Spouse name: Not on file  . Number of children: 3  . Years of education: HS  . Highest education level: 12th grade  Occupational History  . Occupation: Merchandiser, retail: UNEMPLOYED    Comment: Disability  Tobacco Use  . Smoking status: Never Smoker  . Smokeless tobacco: Never  Used  Substance and Sexual Activity  . Alcohol use: No  . Drug use: No  . Sexual activity: Not on file  Other Topics Concern  . Not on file  Social History Narrative   Patient is right-handed. She avoids caffeine. She has recently been using the treadmill.   Social Determinants of Health   Financial Resource Strain:   . Difficulty of Paying Living Expenses:   Food Insecurity:   . Worried About Charity fundraiser in the Last Year:   . Arboriculturist in the Last Year:   Transportation Needs:   . Film/video editor (Medical):   [name] Lack of Transportation (Non-Medical):   Physical Activity:   . Days of  Exercise per Week:   . Minutes of Exercise per Session:   Stress:   . Feeling of Stress :   Social Connections:   . Frequency of Communication with Friends and Family:   . Frequency of Social Gatherings with Friends and Family:   . Attends Religious Services:   . Active Member of Clubs or Organizations:   . Attends Archivist Meetings:   [name] Marital Status:   Intimate Partner Violence:   . Fear of Current or Ex-Partner:   . Emotionally Abused:   [name] Physically Abused:   . Sexually Abused:     Vital Signs: Blood pressure (!) 149/87, pulse 71, temperature 97.6 F (36.4 C), resp. rate 16, height 5\' 3"  (1.6 m), weight 238 lb 6.4 oz (108.1 kg), SpO2 92 %.  Examination: General Appearance: The patient is well-developed, well-nourished, and in no distress. Skin: Gross inspection of skin unremarkable. Head: normocephalic, no gross deformities. Eyes: no gross deformities noted. ENT: ears appear grossly normal no exudates. Neck: Supple. No thyromegaly. No LAD. Respiratory: clear bilaterally. Cardiovascular: Normal S1 and S2 without murmur or rub. Extremities: No cyanosis. pulses are equal. Neurologic: Alert and oriented. No involuntary movements.  LABS: No results found for this or any previous visit (from the past 2160 hour(s)).  Radiology: NM GASTRIC  EMPTYING  Result Date: 06/17/2019 CLINICAL DATA:  Chronic nausea for years, occasional vomiting, diarrhea, mid abdominal pain, history irritable bowel syndrome and GERD EXAM: NUCLEAR MEDICINE GASTRIC EMPTYING SCAN TECHNIQUE: After oral ingestion of radiolabeled meal, sequential abdominal images were obtained for 4 hours. Percentage of activity emptying the stomach was calculated at 1 hour, 2 hour, 3 hour, and 4 hours. RADIOPHARMACEUTICALS:  2.6 mCi Tc-41m sulfur colloid in standardized meal COMPARISON:  None FINDINGS: Expected location of the stomach in the left upper quadrant. Ingested meal empties the stomach gradually over the course of the study. 26% emptied at 1 hr ( normal >= 10%) 78% emptied at 2 hr ( normal >= 40%) 94% emptied at 3 hr ( normal >= 70%) 100% emptied at 4 hr ( normal >= 90%) IMPRESSION: Normal gastric emptying study. Electronically Signed   By: Lavonia Dana M.D.   On: 06/17/2019 14:39    No results found.  No results found.    Assessment and Plan: Patient Active Problem List   Diagnosis Date Noted  . Lateral epicondylitis, right elbow 05/25/2019  . Atherosclerosis of aorta ([place]) 10/28/2018  . Elevated glucose 10/28/2018  . Mixed hyperlipidemia 10/28/2018  . Hypothyroidism 07/20/2018  . LLQ pain 12/03/2017  . Pre-diabetes 06/03/2017  . Rectal bleeding 06/02/2017  . S/P cervical spinal fusion 03/11/2016  . Bilateral hand pain 11/29/2015  . Elevated C-reactive protein 11/29/2015  . Elevated rheumatoid factor 11/29/2015  . Raynaud's phenomenon without gangrene 11/29/2015  . Bloating 09/12/2014  . Dizziness 08/08/2014  . Peri-menopause 08/08/2014  . Chronic tension-type headache, intractable 07/13/2014  . Gastroesophageal reflux disease without esophagitis 07/12/2014  . Anxiety 04/18/2014  . Chronic arthritis 04/18/2014  . Cystitis 04/18/2014  . Morbid obesity ([place]) 04/18/2014  . Fibromyalgia 04/18/2014  . 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 Arthropathy 04/18/2014  . Cephalalgia 04/04/2014  . Numbness and tingling 04/04/2014  . 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 Nonspecific abnormal findings on radiological and other examination of skull and head 02/23/2013  . Other nonspecific abnormal result of function study of brain and central nervous system 07/01/2012  . Disturbance of  skin sensation 07/01/2012  . Migraine without aura 07/01/2012  . Abdominal pain 02/11/2012  . Chronic fatigue syndrome 02/11/2012  . Chronic interstitial cystitis 02/11/2012  . Dyspareunia 02/11/2012  . Dysuria 02/11/2012  . Female stress incontinence 02/11/2012  . Irritable bowel syndrome with diarrhea 02/11/2012  . Nausea and vomiting 02/11/2012  . Nocturia 02/11/2012  . Urge incontinence 02/11/2012  . Urinary urgency 02/11/2012  . Lumbar spondylosis 02/04/2012  . Myalgia and myositis 02/04/2012  . Depression 02/04/2012  . Lumbosacral spondylosis without myelopathy 02/04/2012  . OBESITY, UNSPECIFIED 10/03/2010  . GENERALIZED ANXIETY DISORDER 10/03/2010  . Chest pain 10/03/2010    1. Seasonal allergic rhinitis due to pollen Stable, continue singulair as discussed.  - montelukast (SINGULAIR) 10 MG tablet; Take 1 tablet (10 mg total) by mouth at bedtime.  Dispense: 30 tablet; Refill: 3  2. OSA on CPAP Discussed importance of cpap compliance.  She verbalized understanding, and will start wearing again this week.  3. Moderate asthma, unspecified whether complicated, unspecified whether persistent Stable, continue current meds and management.   4. Morbid obesity (HCC) BMI greater than 40  5. BMI 40.0-44.9, adult (HCC) Obesity Counseling: Risk Assessment: An assessment of behavioral risk factors was made today and includes lack of exercise sedentary lifestyle, lack of portion control and poor dietary habits.  Risk Modification Advice: She was counseled on portion control  guidelines. Restricting daily caloric intake to 1800. The detrimental long term effects of obesity on her health and ongoing poor compliance was also discussed with the patient.    General Counseling: I have discussed the findings of the evaluation and examination with Karina Greer.  I have also discussed any further diagnostic evaluation thatmay be needed or ordered today. Karina Greer verbalizes understanding of the findings of todays visit. We also reviewed her medications today and discussed drug interactions and side effects including but not limited excessive drowsiness and altered mental states. We also discussed that there is always a risk not just to her but also people around her. she has been encouraged to call the office with any questions or concerns that should arise related to todays visit.  No orders of the defined types were placed in this encounter.    Time spent: 25 This patient was seen by Orson Gear AGNP-C in Collaboration with Dr. Devona Konig as a part of collaborative care agreement.   I have personally obtained a history, examined the patient, evaluated laboratory and imaging results, formulated the assessment and plan and placed orders.    Allyne Gee, MD [facility] Pulmonary and Critical Care Sleep medicine

## 2019-12-14 ENCOUNTER — Encounter: Payer: Medicare Other | Attending: Physical Medicine & Rehabilitation | Admitting: Registered Nurse

## 2019-12-14 ENCOUNTER — Encounter: Payer: Self-pay | Admitting: Registered Nurse

## 2019-12-14 ENCOUNTER — Other Ambulatory Visit: Payer: Self-pay

## 2019-12-14 ENCOUNTER — Telehealth: Payer: Self-pay

## 2019-12-14 VITALS — BP 141/82 | HR 80 | Temp 97.7°F | Ht 63.0 in | Wt 239.0 lb

## 2019-12-14 DIAGNOSIS — Z79899 Other long term (current) drug therapy: Secondary | ICD-10-CM

## 2019-12-14 DIAGNOSIS — M542 Cervicalgia: Secondary | ICD-10-CM

## 2019-12-14 DIAGNOSIS — M47816 Spondylosis without myelopathy or radiculopathy, lumbar region: Secondary | ICD-10-CM | POA: Diagnosis not present

## 2019-12-14 DIAGNOSIS — R200 Anesthesia of skin: Secondary | ICD-10-CM | POA: Insufficient documentation

## 2019-12-14 DIAGNOSIS — Z5181 Encounter for therapeutic drug level monitoring: Secondary | ICD-10-CM

## 2019-12-14 DIAGNOSIS — R202 Paresthesia of skin: Secondary | ICD-10-CM | POA: Diagnosis not present

## 2019-12-14 DIAGNOSIS — F411 Generalized anxiety disorder: Secondary | ICD-10-CM | POA: Diagnosis not present

## 2019-12-14 DIAGNOSIS — N390 Urinary tract infection, site not specified: Secondary | ICD-10-CM | POA: Insufficient documentation

## 2019-12-14 DIAGNOSIS — M7062 Trochanteric bursitis, left hip: Secondary | ICD-10-CM | POA: Insufficient documentation

## 2019-12-14 DIAGNOSIS — M797 Fibromyalgia: Secondary | ICD-10-CM | POA: Insufficient documentation

## 2019-12-14 DIAGNOSIS — Z981 Arthrodesis status: Secondary | ICD-10-CM

## 2019-12-14 DIAGNOSIS — G894 Chronic pain syndrome: Secondary | ICD-10-CM

## 2019-12-14 DIAGNOSIS — F332 Major depressive disorder, recurrent severe without psychotic features: Secondary | ICD-10-CM | POA: Diagnosis not present

## 2019-12-14 MED ORDER — HYDROCODONE-ACETAMINOPHEN 10-325 MG PO TABS: ORAL_TABLET | ORAL | 0 refills | Status: DC

## 2019-12-14 NOTE — Progress Notes (Signed)
Subjective:    Patient ID: Karina Greer, female    DOB: 03/15/1966, 54 y.o.   MRN: [id number]  HPI: Karina Greer is a 54 y.o. female who returns for follow up appointment for chronic pain and medication refill. She states her pain is located in her neck and lower back pain radiating into her left hip. She rates her pain 7. Her current exercise regime is walking and performing stretching exercises.  [name] equivalent is 30.00 MME. Oral Swab Performed Today.    Last UDS was Performed on 08/18/2019, it was consistent.   Pain Inventory Average Pain 9 Pain Right Now 7 My pain is constant, sharp and stabbing  In the last 24 hours, has pain interfered with the following? General activity 7 Relation with others 8 Enjoyment of life 6 What TIME of day is your pain at its worst? all Sleep (in general) Fair  Pain is worse with: walking, bending, sitting, inactivity, standing, unsure and some activites Pain improves with: rest, heat/ice, therapy/exercise, pacing activities, medication and TENS Relief from Meds: 7  Mobility walk without assistance do you drive?  yes  Function I need assistance with the following:  meal prep, household duties and shopping  Neuro/Psych dizziness anxiety  Prior Studies Any changes since last visit?  no  Physicians involved in your care Any changes since last visit?  no   Family History  Problem Relation Age of Onset  . Hypertension Mother   . Diabetes Father   . Brain cancer Father   . Fibromyalgia Sister   . Suicidality Sister   . Hypertension Other        Cancer, Cerebrovascular disease run on mother side of family   Social History   Socioeconomic History  . Marital status: Divorced    Spouse name: Not on file  . Number of children: 3  . Years of education: HS  . Highest education level: 12th grade  Occupational History  . Occupation: Merchandiser, retail: UNEMPLOYED    Comment: Disability  Tobacco Use  .  Smoking status: Never Smoker  . Smokeless tobacco: Never Used  Substance and Sexual Activity  . Alcohol use: No  . Drug use: No  . Sexual activity: Not on file  Other Topics Concern  . Not on file  Social History Narrative   Patient is right-handed. She avoids caffeine. She has recently been using the treadmill.   Social Determinants of Health   Financial Resource Strain:   . Difficulty of Paying Living Expenses:   Food Insecurity:   . Worried About Charity fundraiser in the Last Year:   . Arboriculturist in the Last Year:   Transportation Needs:   . Film/video editor (Medical):   [name] Lack of Transportation (Non-Medical):   Physical Activity:   . Days of Exercise per Week:   . Minutes of Exercise per Session:   Stress:   . Feeling of Stress :   Social Connections:   . Frequency of Communication with Friends and Family:   . Frequency of Social Gatherings with Friends and Family:   . Attends Religious Services:   . Active Member of Clubs or Organizations:   . Attends Archivist Meetings:   [name] Marital Status:    Past Surgical History:  Procedure Laterality Date  . ACHILLES TENDON SURGERY Right 3/16  . ANTERIOR CERVICAL DECOMP/DISCECTOMY FUSION N/A 03/11/2016   Procedure: C5-6, C6-7 Anterior Cervical Discectomy and Fusion,  Allograft, Plate;  Surgeon: Marybelle Killings, MD;  Location: [facility];  Service: Orthopedics;  Laterality: N/A;  . CHOLECYSTECTOMY  2014  . COLONOSCOPY WITH PROPOFOL N/A 08/25/2017   Procedure: COLONOSCOPY WITH PROPOFOL;  Surgeon: Manya Silvas, MD;  [facility];  Service: Endoscopy;  Laterality: N/A;  . CYSTO WITH HYDRODISTENSION N/A 01/26/2014   Procedure: CYSTOSCOPY/HYDRODISTENSION;  Surgeon: Bernestine Amass, MD;  Location: [facility];  Service: Urology;  Laterality: N/A;  . CYSTO/  URETHRAL DILATION/  HYDRODISTENTION/   INSTILLATION THERAPY  07-16-2010//   12-30-2007//   10-27-2006  . EXERCISE TOLERENCE TEST  10-12-2010    NEGATIVE  ADEQUATE ETT/  NO ISCHEMIA OR EVIDENCE HIGH GRADE OBSTRUCTIVE CAD/  NO FURTHER TEST NEEDED  . [place] ENDOMETRIAL ABLATION  2014  . LAPAROSCOPIC OVARIAN CYST BX  2005   AND  URETEROSCOPIC LASER LITHO  STONE EXTRACTION  . SHOULDER OPEN ROTATOR CUFF REPAIR Right 2004  . TONSILLECTOMY    . TRANSTHORACIC ECHOCARDIOGRAM  06-07-2006   normal study/  ef 60-65%  . TUBAL LIGATION Bilateral 1995   Past Medical History:  Diagnosis Date  . Anxiety   . Anxiety disorder   . Arthritis   . Blepharitis   . Chronic low back pain   . Depression   . Dyslipidemia   . Fibromyalgia   . GERD (gastroesophageal reflux disease)   . Headache    hx migraines  . History of kidney stones   . History of panic attacks   . History of renal calculi   . IBS (irritable bowel syndrome)   . Interstitial cystitis   . Lupus ([place])    tested positive for antibodies for lupus. dr to do further tests  . Menorrhagia   . OSA on CPAP    not used cpap for several weeks  . Pneumonia    hx  . PONV (postoperative nausea and vomiting)   . RLS (restless legs syndrome)   . Rosacea   . Seasonal asthma   . SI (sacroiliac) joint dysfunction   . SUI (stress urinary incontinence, female)   . UTI (lower urinary tract infection)    hx  . 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 02/23/2013   BP (!) 141/82   Pulse 80   Temp 97.7 F (36.5 C)   Ht 5\' 3"  (1.6 m)   Wt 239 lb (108.4 kg)   SpO2 97%   BMI 42.34 kg/m   Opioid Risk Score:   Fall Risk Score:  `1  Depression screen PHQ 2/9  Depression screen [facility] 2/9 08/18/2019 06/21/2019 08/12/2018 07/20/2018 06/19/2018 04/20/2018 01/29/2018  Decreased Interest 2 0 0 1 1 0 0  Down, Depressed, Hopeless 2 0 0 1 1 0 0  PHQ - 2 Score 4 0 0 2 2 0 0  Altered sleeping - - - - - - -  Tired, decreased energy - - - - - - -  Change in appetite - - - - - - -  Feeling bad or failure about yourself  - - - - - - -  Trouble concentrating - - - - - - -  Moving  slowly or fidgety/restless - - - - - - -  Suicidal thoughts - - - - - - -  PHQ-9 Score - - - - - - -  Difficult doing work/chores - - - - - - -  Some recent data might be hidden     Review of  Systems  Constitutional: Positive for appetite change and unexpected weight change.  HENT: Negative.   Eyes: Negative.   Respiratory: Positive for apnea.   Cardiovascular: Negative.   Gastrointestinal: Positive for nausea.  Endocrine: Negative.   Genitourinary: Negative.   Musculoskeletal: Positive for arthralgias and back pain.  Neurological: Negative.   Hematological: Negative.   Psychiatric/Behavioral: Negative.   All other systems reviewed and are negative.      Objective:   Physical Exam Vitals and nursing note reviewed.  Constitutional:      Appearance: Normal appearance.  Neck:     Comments: Cervical Paraspinal Tenderness: C-5-C-6 Cardiovascular:     Rate and Rhythm: Normal rate and regular rhythm.     Pulses: Normal pulses.     Heart sounds: Normal heart sounds.  Pulmonary:     Effort: Pulmonary effort is normal.     Breath sounds: Normal breath sounds.  Musculoskeletal:     Cervical back: Normal range of motion and neck supple.     Comments: Normal Muscle Bulk and Muscle Testing Reveals:  Upper Extremities: Full ROM and Muscle Strength 5/5 Bilateral AC Joint Tenderness Lumbar Paraspinal Tenderness: L-3-L-5 Left Greater Trochanter Tenderness Lower Extremities: Right: Full ROM and Muscle Strength 5/5 Left: Decreased ROM and Muscle Strength 5/5 Left Lower Extremity Flexion Produces Pain into her Left Hip Arises from chair slowly Narrow Based  Gait   Skin:    General: Skin is warm and dry.  Neurological:     Mental Status: She is alert and oriented to person, place, and time.  Psychiatric:        Mood and Affect: Mood normal.        Behavior: Behavior normal.           Assessment & Plan:  1. Lumbago/ Lumbar Spondylosis/LeftLumbar Radiculitis: Continue to  Monitor.12/14/2019. Refilled: HYDROcodone 10/325mg  one tablet every8hours as needed #90.  We will continue the opioid monitoring program, this consists of regular clinic visits, examinations, urine drug screen, pill counts as well as use of [place] Controlled Substance Reporting System. 2. Fibromyalgia. Continue Current exercise Regime.12/14/2019 3. Anxiety and depression:Psychiatry Following:[facility]. Continue Counseling at [facility].12/14/2019 4. Migraines: On Maxalt. Neurology Following.12/14/2019 5. OSA :PCP Following. Continue to Monitor.12/14/2019 6. Obesity: Following Healthy Diet Regimenand Continue HEP as Tolerated.12/14/2019 7. Status Post Cervical Spinal Fusion: C5C6- C-6- C-7 Anterior Cervical Discectomy Fusion and Allograft Plate:Dr. Lorin Mercy Following.11/16/2019 8. Cervicalgia/ Cervical Radiculitis/ S/P Cervical Spinal Fusion: Continueto MonitorDr. Lorin Mercy Following.12/14/2019 9. Muscle Spasm: Continue Tizanidine as needed.12/14/2019 10.Hereditary and Idiopathic Peripheral Neuropathy Continue with Tens Unit.11/16/2019. 11.LeftGreater Trochanteric Bursitis:Ms. Gotch agrees with Left Hip Injection. Scheduled for Left Hip Injection with Dr. Ranell Patrick this week, she verbalizes understanding.. Continue to Alternate Ice andheat Therapy. Continue to Monitor.12/14/2019. 12. Chronic Bilateral Knee Pain:No Complaints Today.Continue HEP as Tolerated. Continue to Monitor.12/14/2019  57minutes of face to face patient care time was spent during this visit. All questions were encouraged and answered.  F/U in 1 month

## 2019-12-16 ENCOUNTER — Other Ambulatory Visit: Payer: Self-pay

## 2019-12-16 ENCOUNTER — Encounter (HOSPITAL_BASED_OUTPATIENT_CLINIC_OR_DEPARTMENT_OTHER): Payer: Medicare Other | Admitting: Physical Medicine and Rehabilitation

## 2019-12-16 ENCOUNTER — Encounter: Payer: Self-pay | Admitting: Physical Medicine and Rehabilitation

## 2019-12-16 VITALS — BP 131/79 | HR 7 | Temp 97.7°F | Ht 63.0 in | Wt 240.0 lb

## 2019-12-16 DIAGNOSIS — Z79899 Other long term (current) drug therapy: Secondary | ICD-10-CM | POA: Diagnosis not present

## 2019-12-16 DIAGNOSIS — Z5181 Encounter for therapeutic drug level monitoring: Secondary | ICD-10-CM | POA: Diagnosis not present

## 2019-12-16 DIAGNOSIS — M797 Fibromyalgia: Secondary | ICD-10-CM | POA: Diagnosis not present

## 2019-12-16 DIAGNOSIS — N390 Urinary tract infection, site not specified: Secondary | ICD-10-CM | POA: Diagnosis not present

## 2019-12-16 DIAGNOSIS — M7062 Trochanteric bursitis, left hip: Secondary | ICD-10-CM

## 2019-12-16 DIAGNOSIS — G894 Chronic pain syndrome: Secondary | ICD-10-CM | POA: Diagnosis not present

## 2019-12-16 NOTE — Progress Notes (Addendum)
Left-sided Trochanteric bursa injection with ultrasound guidance  Indication Trochanteric bursitis. Exam has tenderness over the greater trochanter of the hip. Pain has not responded to conservative care such as exercise therapy and oral medications. Pain interferes with sleep or with mobility Informed consent was obtained after describing risks and benefits of the procedure with the patient these include bleeding bruising and infection. Patient has signed written consent form. Patient placed in a lateral decubitus position with the affected hip superior. Sterile precautions taken. Point of maximal pain was palpated marked and prepped with Betadine and entered with a needle to bone contact. Evidence of fluid in bursa and inflammation of glut med tendon on ultrasound. Needle slightly withdrawn then 6mg  of betamethasone with 3 cc 1% lidocaine were injected. Patient tolerated procedure well. Post procedure instructions given.  Script for physical therapy provided.   Patient would benefit from home E-STIM device due to atrophy in left hip contributing to her pain. Strength in left hip is 4/5

## 2019-12-18 LAB — DRUG TOX MONITOR 1 W/CONF, ORAL FLD
Amphetamines: NEGATIVE ng/mL (ref <10)
Barbiturates: NEGATIVE ng/mL (ref <10)
Benzodiazepines: NEGATIVE ng/mL (ref <0.50)
Buprenorphine: NEGATIVE ng/mL (ref <0.10)
Cocaine: NEGATIVE ng/mL (ref <5.0)
Codeine: NEGATIVE ng/mL (ref <2.5)
Dihydrocodeine: NEGATIVE ng/mL (ref <2.5)
Fentanyl: NEGATIVE ng/mL (ref <0.10)
Heroin Metabolite: NEGATIVE ng/mL (ref <1.0)
Hydrocodone: 11.1 ng/mL — ABNORMAL HIGH (ref <2.5)
Hydromorphone: NEGATIVE ng/mL (ref <2.5)
MARIJUANA: NEGATIVE ng/mL (ref <2.5)
MDMA: NEGATIVE ng/mL (ref <10)
Meprobamate: NEGATIVE ng/mL (ref <2.5)
Methadone: NEGATIVE ng/mL (ref <5.0)
Morphine: NEGATIVE ng/mL (ref <2.5)
Nicotine Metabolite: NEGATIVE ng/mL (ref <5.0)
Norhydrocodone: NEGATIVE ng/mL (ref <2.5)
Noroxycodone: NEGATIVE ng/mL (ref <2.5)
Opiates: POSITIVE ng/mL — AB (ref <2.5)
Oxycodone: NEGATIVE ng/mL (ref <2.5)
Oxymorphone: NEGATIVE ng/mL (ref <2.5)
Phencyclidine: NEGATIVE ng/mL (ref <10)
Tapentadol: NEGATIVE ng/mL (ref <5.0)
Tramadol: NEGATIVE ng/mL (ref <5.0)
Zolpidem: NEGATIVE ng/mL (ref <5.0)

## 2019-12-18 LAB — DRUG TOX ALC METAB W/CON, ORAL FLD: Alcohol Metabolite: NEGATIVE ng/mL (ref <25)

## 2019-12-20 MED FILL — HYDROCODON-APAP 10-325: 10-325 | 30 days supply | Qty: 90 | Fill #0

## 2019-12-22 ENCOUNTER — Ambulatory Visit (INDEPENDENT_AMBULATORY_CARE_PROVIDER_SITE_OTHER): Payer: Medicare Other

## 2019-12-22 ENCOUNTER — Other Ambulatory Visit: Payer: Self-pay

## 2019-12-22 DIAGNOSIS — J301 Allergic rhinitis due to pollen: Secondary | ICD-10-CM | POA: Diagnosis not present

## 2019-12-28 ENCOUNTER — Telehealth: Payer: Self-pay | Admitting: *Deleted

## 2019-12-28 ENCOUNTER — Telehealth: Payer: Self-pay

## 2019-12-28 NOTE — Telephone Encounter (Signed)
UDS RESULTS CONSISTENT WITH MEDICATIONS ON FILE  

## 2019-12-28 NOTE — Telephone Encounter (Signed)
[place] Doctor sent an E-mail regarding patient Karina Greer. In order to get approval for the TENS unit she needs specific language put in the patients last clinic note that states something to the effect of:   "Patient would benefit from home E-STIM device due to atrophy in left hip contributing to her pain. Strength in left hip is 4/5"  This would need to be in your own words of course.  Her insurance company does not like the term TENS unit. They prefer E-stim device.  There needs to be mention of the benefit regarding patients muscular atrophy in a specific region.    If you need more details you can call the [facility] representative.  Her name is Educational psychologist, phone# [phone]

## 2019-12-30 NOTE — Telephone Encounter (Signed)
Medications were called in on 12/08/19

## 2020-01-04 ENCOUNTER — Ambulatory Visit (INDEPENDENT_AMBULATORY_CARE_PROVIDER_SITE_OTHER): Payer: Medicare Other | Admitting: Orthopaedic Surgery

## 2020-01-04 ENCOUNTER — Encounter: Payer: Self-pay | Admitting: Orthopaedic Surgery

## 2020-01-04 ENCOUNTER — Other Ambulatory Visit: Payer: Self-pay

## 2020-01-04 VITALS — Ht 63.0 in | Wt 235.0 lb

## 2020-01-04 DIAGNOSIS — M7711 Lateral epicondylitis, right elbow: Secondary | ICD-10-CM

## 2020-01-04 MED ORDER — BUPIVACAINE HCL 0.5 % IJ SOLN
1.0000 mL | INTRAMUSCULAR | Status: AC | PRN
  Administered 2020-01-04: 1 mL via INTRA_ARTICULAR

## 2020-01-04 MED ORDER — METHYLPREDNISOLONE ACETATE 40 MG/ML IJ SUSP
40.0000 mg | INTRAMUSCULAR | Status: AC | PRN
  Administered 2020-01-04: 40 mg via INTRA_ARTICULAR

## 2020-01-04 MED ORDER — LIDOCAINE HCL 1 % IJ SOLN
0.5000 mL | INTRAMUSCULAR | Status: AC | PRN
  Administered 2020-01-04: .5 mL

## 2020-01-04 NOTE — Progress Notes (Signed)
Office Visit Note   Patient: Karina Greer           Date of Birth: 01-09-1966           MRN: [id number] Visit Date: 01/04/2020              Requested by: Cyndi Bender, PA-C [address] [place],  [place] [id number] PCP: Cyndi Bender, PA-C   Assessment & Plan: Visit Diagnoses:  1. Lateral epicondylitis, right elbow     Plan: Patient got 7 months since her last injection at 6 months of good relief.  We will repeat her epicondylar injection.  Continue to use tennis elbow splint she will back off on her gardening which she is already completed most of it.  We discussed activity modification to help decrease the stress on the tendons.  She has persistent problems we discussed today possible operative intervention.  Follow-Up Instructions: No follow-ups on file.   Orders:  Orders Placed This Encounter  Procedures  . Medium Joint Inj   No orders of the defined types were placed in this encounter.     Procedures: Medium Joint Inj: R lateral epicondyle on 01/04/2020 11:07 AM Indications: pain Details: 22 G 1.5 in needle, anterolateral approach Medications: 1 mL bupivacaine 0.5 %; 40 mg methylPREDNISolone acetate 40 MG/ML; 0.5 mL lidocaine 1 % Outcome: tolerated well, no immediate complications Procedure, treatment alternatives, risks and benefits explained, specific risks discussed. Consent was given by the patient. Immediately prior to procedure a time out was called to verify the correct patient, procedure, equipment, support staff and site/side marked as required. Patient was prepped and draped in the usual sterile fashion.       Clinical Data: No additional findings.   Subjective: Chief Complaint  Patient presents with  . Right Elbow - Pain    HPI 54 year old female returns with recurrent right lateral epicondylitis.  She had an injection done in September and to been doing well until recently when she was doing gardening lifting pots planting plants using a hand  trowl.  She has been using her tennis elbow brace but states the pain is gotten worse to the point where she is having trouble grabbing squeezing, gripping doors, handles etc.  Her husband has upcoming total hip arthroplasty surgery and she wants to make sure she is able to work on meals and help him if needed.  Review of Systems 14 point system update unchanged from September 2020 office visit other than as mentioned HPI.   Objective: Vital Signs: Ht 5\' 3"  (1.6 m)   Wt 235 lb (106.6 kg)   BMI 41.63 kg/m   Physical Exam Constitutional:      Appearance: She is well-developed.  HENT:     Head: Normocephalic.     Right Ear: External ear normal.     Left Ear: External ear normal.  Eyes:     Pupils: Pupils are equal, round, and reactive to light.  Neck:     Thyroid: No thyromegaly.     Trachea: No tracheal deviation.  Cardiovascular:     Rate and Rhythm: Normal rate.  Pulmonary:     Effort: Pulmonary effort is normal.  Abdominal:     Palpations: Abdomen is soft.  Skin:    General: Skin is warm and dry.  Neurological:     Mental Status: She is alert and oriented to person, place, and time.  Psychiatric:        Behavior: Behavior normal.  Ortho Exam patient is exquisite tenderness over the right lateral epicondyle.  Pain with gripping pain with resisted wrist extension.  Medial epicondyle ulnar nerve is normal.  No thenar atrophy sensation that fingers are normal.  Specialty Comments:  No specialty comments available.  Imaging: No results found.   PMFS History: Patient Active Problem List   Diagnosis Date Noted  . Lateral epicondylitis, right elbow 05/25/2019  . Atherosclerosis of aorta ([place]) 10/28/2018  . Elevated glucose 10/28/2018  . Mixed hyperlipidemia 10/28/2018  . Hypothyroidism 07/20/2018  . LLQ pain 12/03/2017  . Pre-diabetes 06/03/2017  . Rectal bleeding 06/02/2017  . S/P cervical spinal fusion 03/11/2016  . Bilateral hand pain 11/29/2015  . Elevated  C-reactive protein 11/29/2015  . Elevated rheumatoid factor 11/29/2015  . Raynaud's phenomenon without gangrene 11/29/2015  . Bloating 09/12/2014  . Dizziness 08/08/2014  . Peri-menopause 08/08/2014  . Chronic tension-type headache, intractable 07/13/2014  . Gastroesophageal reflux disease without esophagitis 07/12/2014  . Anxiety 04/18/2014  . Chronic arthritis 04/18/2014  . Cystitis 04/18/2014  . Morbid obesity ([place]) 04/18/2014  . Fibromyalgia 04/18/2014  . 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 Arthropathy 04/18/2014  . Cephalalgia 04/04/2014  . Numbness and tingling 04/04/2014  . 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 Nonspecific abnormal findings on radiological and other examination of skull and head 02/23/2013  . Other nonspecific abnormal result of function study of brain and central nervous system 07/01/2012  . Disturbance of skin sensation 07/01/2012  . Migraine without aura 07/01/2012  . Abdominal pain 02/11/2012  . Chronic fatigue syndrome 02/11/2012  . Chronic interstitial cystitis 02/11/2012  . Dyspareunia 02/11/2012  . Dysuria 02/11/2012  . Female stress incontinence 02/11/2012  . Irritable bowel syndrome with diarrhea 02/11/2012  . Nausea and vomiting 02/11/2012  . Nocturia 02/11/2012  . Urge incontinence 02/11/2012  . Urinary urgency 02/11/2012  . Lumbar spondylosis 02/04/2012  . Myalgia and myositis 02/04/2012  . Depression 02/04/2012  . Lumbosacral spondylosis without myelopathy 02/04/2012  . OBESITY, UNSPECIFIED 10/03/2010  . GENERALIZED ANXIETY DISORDER 10/03/2010  . Chest pain 10/03/2010   Past Medical History:  Diagnosis Date  . Anxiety   . Anxiety disorder   . Arthritis   . Blepharitis   . Chronic low back pain   . Depression   . Dyslipidemia   . Fibromyalgia   . GERD (gastroesophageal reflux disease)   . Headache    hx migraines  . History of kidney stones   .  History of panic attacks   . History of renal calculi   . IBS (irritable bowel syndrome)   . Interstitial cystitis   . Lupus ([place])    tested positive for antibodies for lupus. dr to do further tests  . Menorrhagia   . OSA on CPAP    not used cpap for several weeks  . Pneumonia    hx  . PONV (postoperative nausea and vomiting)   . RLS (restless legs syndrome)   . Rosacea   . Seasonal asthma   . SI (sacroiliac) joint dysfunction   . SUI (stress urinary incontinence, female)   . UTI (lower urinary tract infection)    hx  . 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 Hypertension Mother   . Diabetes Father   . Brain cancer Father   . Fibromyalgia Sister   . Suicidality Sister   . Hypertension  Other        Cancer, Cerebrovascular disease run on mother side of family    Past Surgical History:  Procedure Laterality Date  . ACHILLES TENDON SURGERY Right 3/16  . ANTERIOR CERVICAL DECOMP/DISCECTOMY FUSION N/A 03/11/2016   Procedure: C5-6, C6-7 Anterior Cervical Discectomy and Fusion, Allograft, Plate;  Surgeon: Marybelle Killings, MD;  Location: [facility];  Service: Orthopedics;  Laterality: N/A;  . CHOLECYSTECTOMY  2014  . COLONOSCOPY WITH PROPOFOL N/A 08/25/2017   Procedure: COLONOSCOPY WITH PROPOFOL;  Surgeon: Manya Silvas, MD;  Location: [facility] ENDOSCOPY;  Service: Endoscopy;  Laterality: N/A;  . CYSTO WITH HYDRODISTENSION N/A 01/26/2014   Procedure: CYSTOSCOPY/HYDRODISTENSION;  Surgeon: Bernestine Amass, MD;  Location: [facility];  Service: Urology;  Laterality: N/A;  . CYSTO/  URETHRAL DILATION/  HYDRODISTENTION/   INSTILLATION THERAPY  07-16-2010//   12-30-2007//   10-27-2006  . EXERCISE TOLERENCE TEST  10-12-2010   NEGATIVE  ADEQUATE ETT/  NO ISCHEMIA OR EVIDENCE HIGH GRADE OBSTRUCTIVE CAD/  NO FURTHER TEST NEEDED  . [place] ENDOMETRIAL ABLATION  2014  . LAPAROSCOPIC OVARIAN CYST BX  2005   AND   URETEROSCOPIC LASER LITHO  STONE EXTRACTION  . SHOULDER OPEN ROTATOR CUFF REPAIR Right 2004  . TONSILLECTOMY    . TRANSTHORACIC ECHOCARDIOGRAM  06-07-2006   normal study/  ef 60-65%  . TUBAL LIGATION Bilateral 1995   Social History   Occupational History  . Occupation: Merchandiser, retail: UNEMPLOYED    Comment: Disability  Tobacco Use  . Smoking status: Never Smoker  . Smokeless tobacco: Never Used  Substance and Sexual Activity  . Alcohol use: No  . Drug use: No  . Sexual activity: Not on file

## 2020-01-05 ENCOUNTER — Ambulatory Visit: Payer: Medicare Other

## 2020-01-12 ENCOUNTER — Ambulatory Visit (INDEPENDENT_AMBULATORY_CARE_PROVIDER_SITE_OTHER): Payer: Medicare Other

## 2020-01-12 ENCOUNTER — Other Ambulatory Visit: Payer: Self-pay

## 2020-01-12 DIAGNOSIS — J301 Allergic rhinitis due to pollen: Secondary | ICD-10-CM

## 2020-01-13 ENCOUNTER — Encounter: Payer: Self-pay | Admitting: Registered Nurse

## 2020-01-13 ENCOUNTER — Encounter: Payer: Medicare Other | Attending: Physical Medicine & Rehabilitation | Admitting: Registered Nurse

## 2020-01-13 ENCOUNTER — Other Ambulatory Visit: Payer: Self-pay

## 2020-01-13 VITALS — BP 126/76 | HR 65 | Temp 97.5°F | Ht 63.0 in | Wt 236.0 lb

## 2020-01-13 DIAGNOSIS — R202 Paresthesia of skin: Secondary | ICD-10-CM | POA: Insufficient documentation

## 2020-01-13 DIAGNOSIS — M47816 Spondylosis without myelopathy or radiculopathy, lumbar region: Secondary | ICD-10-CM | POA: Diagnosis not present

## 2020-01-13 DIAGNOSIS — R200 Anesthesia of skin: Secondary | ICD-10-CM | POA: Diagnosis not present

## 2020-01-13 DIAGNOSIS — M797 Fibromyalgia: Secondary | ICD-10-CM | POA: Insufficient documentation

## 2020-01-13 DIAGNOSIS — M7062 Trochanteric bursitis, left hip: Secondary | ICD-10-CM | POA: Diagnosis not present

## 2020-01-13 DIAGNOSIS — M542 Cervicalgia: Secondary | ICD-10-CM

## 2020-01-13 DIAGNOSIS — N390 Urinary tract infection, site not specified: Secondary | ICD-10-CM | POA: Insufficient documentation

## 2020-01-13 DIAGNOSIS — Z79899 Other long term (current) drug therapy: Secondary | ICD-10-CM

## 2020-01-13 DIAGNOSIS — Z981 Arthrodesis status: Secondary | ICD-10-CM | POA: Diagnosis not present

## 2020-01-13 DIAGNOSIS — G894 Chronic pain syndrome: Secondary | ICD-10-CM | POA: Insufficient documentation

## 2020-01-13 DIAGNOSIS — Z5181 Encounter for therapeutic drug level monitoring: Secondary | ICD-10-CM | POA: Diagnosis not present

## 2020-01-13 DIAGNOSIS — M7711 Lateral epicondylitis, right elbow: Secondary | ICD-10-CM

## 2020-01-13 DIAGNOSIS — M5416 Radiculopathy, lumbar region: Secondary | ICD-10-CM

## 2020-01-13 MED ORDER — HYDROCODONE-ACETAMINOPHEN 10-325 MG PO TABS: ORAL_TABLET | ORAL | 0 refills | Status: DC

## 2020-01-13 NOTE — Progress Notes (Signed)
Subjective:    Patient ID: Karina Greer, female    DOB: 1965/10/13, 54 y.o.   MRN: [id number]  HPI: Karina Greer is a 54 y.o. female who returns for follow up appointment for chronic pain and medication refill. She states her pain is located in her neck, right elbow, lower back radiating into her left hip. Also reports bilateral lower extremity pain when laying in the bed, she's going to follow up with her PCP she reports. She rates her pain 7. Her current exercise regime is walking and performing stretching exercises.  Karina Greer Morphine equivalent is 30.00 MME.  Last Oral Swab was Performed on 12/14/2019, it was consistent.    Pain Inventory Average Pain 8 Pain Right Now 7 My pain is constant, stabbing and aching  In the last 24 hours, has pain interfered with the following? General activity 6 Relation with others 6 Enjoyment of life 6 What TIME of day is your pain at its worst? all Sleep (in general) Fair  Pain is worse with: walking, bending, sitting, inactivity, standing and some activites Pain improves with: rest, heat/ice, therapy/exercise, pacing activities, medication and TENS Relief from Meds: 8  Mobility walk without assistance how many minutes can you walk? 20 ability to climb steps?  yes do you drive?  yes transfers alone Do you have any goals in this area?  yes  Function disabled: date disabled . I need assistance with the following:  meal prep, household duties and shopping  Neuro/Psych bladder control problems  Prior Studies Any changes since last visit?  no  Physicians involved in your care Any changes since last visit?  no   Family History  Problem Relation Age of Onset  . Hypertension Mother   . Diabetes Father   . Brain cancer Father   . Fibromyalgia Sister   . Suicidality Sister   . Hypertension Other        Cancer, Cerebrovascular disease run on mother side of family   Social History   Socioeconomic History  . Marital status:  Divorced    Spouse name: Not on file  . Number of children: 3  . Years of education: HS  . Highest education level: 12th grade  Occupational History  . Occupation: Merchandiser, retail: UNEMPLOYED    Comment: Disability  Tobacco Use  . Smoking status: Never Smoker  . Smokeless tobacco: Never Used  Substance and Sexual Activity  . Alcohol use: No  . Drug use: No  . Sexual activity: Not on file  Other Topics Concern  . Not on file  Social History Narrative   Patient is right-handed. She avoids caffeine. She has recently been using the treadmill.   Social Determinants of Health   Financial Resource Strain:   . Difficulty of Paying Living Expenses:   Food Insecurity:   . Worried About Charity fundraiser in the Last Year:   . Arboriculturist in the Last Year:   Transportation Needs:   . Film/video editor (Medical):   [name] Lack of Transportation (Non-Medical):   Physical Activity:   . Days of Exercise per Week:   . Minutes of Exercise per Session:   Stress:   . Feeling of Stress :   Social Connections:   . Frequency of Communication with Friends and Family:   . Frequency of Social Gatherings with Friends and Family:   . Attends Religious Services:   . Active Member of Clubs or Organizations:   .  Attends Archivist Meetings:   [name] Marital Status:    Past Surgical History:  Procedure Laterality Date  . ACHILLES TENDON SURGERY Right 3/16  . ANTERIOR CERVICAL DECOMP/DISCECTOMY FUSION N/A 03/11/2016   Procedure: C5-6, C6-7 Anterior Cervical Discectomy and Fusion, Allograft, Plate;  Surgeon: Marybelle Killings, MD;  Location: [place];  Service: Orthopedics;  Laterality: N/A;  . CHOLECYSTECTOMY  2014  . COLONOSCOPY WITH PROPOFOL N/A 08/25/2017   Procedure: COLONOSCOPY WITH PROPOFOL;  Surgeon: Manya Silvas, MD;  Location: [facility] ENDOSCOPY;  Service: Endoscopy;  Laterality: N/A;  . CYSTO WITH HYDRODISTENSION N/A 01/26/2014   Procedure: CYSTOSCOPY/HYDRODISTENSION;  Surgeon:  Bernestine Amass, MD;  Location: [facility];  Service: Urology;  Laterality: N/A;  . CYSTO/  URETHRAL DILATION/  HYDRODISTENTION/   INSTILLATION THERAPY  07-16-2010//   12-30-2007//   10-27-2006  . EXERCISE TOLERENCE TEST  10-12-2010   NEGATIVE  ADEQUATE ETT/  NO ISCHEMIA OR EVIDENCE HIGH GRADE OBSTRUCTIVE CAD/  NO FURTHER TEST NEEDED  . [place] ENDOMETRIAL ABLATION  2014  . LAPAROSCOPIC OVARIAN CYST BX  2005   AND  URETEROSCOPIC LASER LITHO  STONE EXTRACTION  . SHOULDER OPEN ROTATOR CUFF REPAIR Right 2004  . TONSILLECTOMY    . TRANSTHORACIC ECHOCARDIOGRAM  06-07-2006   normal study/  ef 60-65%  . TUBAL LIGATION Bilateral 1995   Past Medical History:  Diagnosis Date  . Anxiety   . Anxiety disorder   . Arthritis   . Blepharitis   . Chronic low back pain   . Depression   . Dyslipidemia   . Fibromyalgia   . GERD (gastroesophageal reflux disease)   . Headache    hx migraines  . History of kidney stones   . History of panic attacks   . History of renal calculi   . IBS (irritable bowel syndrome)   . Interstitial cystitis   . Lupus ([place])    tested positive for antibodies for lupus. dr to do further tests  . Menorrhagia   . OSA on CPAP    not used cpap for several weeks  . Pneumonia    hx  . PONV (postoperative nausea and vomiting)   . RLS (restless legs syndrome)   . Rosacea   . Seasonal asthma   . SI (sacroiliac) joint dysfunction   . SUI (stress urinary incontinence, female)   . UTI (lower urinary tract infection)    hx  . 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 02/23/2013   Temp (!) 97.5 F (36.4 C)   Ht 5\' 3"  (1.6 m)   Wt 236 lb (107 kg)   BMI 41.81 kg/m   Opioid Risk Score:   Fall Risk Score:  `1  Depression screen PHQ 2/9  Depression screen [facility] 2/9 08/18/2019 06/21/2019 08/12/2018 07/20/2018 06/19/2018 04/20/2018 01/29/2018  Decreased Interest 2 0 0 1 1 0 0  Down, Depressed, Hopeless 2 0 0 1 1 0 0  PHQ - 2 Score 4 0 0 2 2 0 0    Altered sleeping - - - - - - -  Tired, decreased energy - - - - - - -  Change in appetite - - - - - - -  Feeling bad or failure about yourself  - - - - - - -  Trouble concentrating - - - - - - -  Moving slowly or fidgety/restless - - - - - - -  Suicidal thoughts - - - - - - -  PHQ-9 Score - - - - - - -  Difficult doing work/chores - - - - - - -  Some recent data might be hidden    Review of Systems  Constitutional: Negative.   HENT: Negative.   Eyes: Negative.   Respiratory: Negative.   Cardiovascular: Negative.   Gastrointestinal: Negative.   Endocrine: Negative.   Genitourinary: Positive for difficulty urinating.  Musculoskeletal: Positive for arthralgias, back pain, gait problem, neck pain and neck stiffness.  Skin: Negative.   Allergic/Immunologic: Negative.   Hematological: Negative.   Psychiatric/Behavioral: Negative.   All other systems reviewed and are negative.      Objective:   Physical Exam Vitals and nursing note reviewed.  Constitutional:      Appearance: Normal appearance. She is obese.  Cardiovascular:     Rate and Rhythm: Normal rate and regular rhythm.     Pulses: Normal pulses.     Heart sounds: Normal heart sounds.  Pulmonary:     Effort: Pulmonary effort is normal.     Breath sounds: Normal breath sounds.  Musculoskeletal:     Cervical back: Normal range of motion and neck supple.     Comments: Normal Muscle Bulk and Muscle Testing Reveals:  Upper Extremities: Full ROM and Muscle Strength 5/5 Lumbar Paraspinal Tenderness Left Greater Trochanteric Tenderness Lower Extremities: Full ROM and Muscle Strength 5/5 Arises from chair with ease Narrow Based Gait   Skin:    General: Skin is warm and dry.  Neurological:     Mental Status: She is oriented to person, place, and time.  Psychiatric:        Mood and Affect: Mood normal.        Behavior: Behavior normal.           Assessment & Plan:  1. Lumbago/ Lumbar Spondylosis/LeftLumbar  Radiculitis: Continue to Monitor.01/13/2020. Refilled: HYDROcodone 10/325mg  one tablet every8hours as needed #90.  We will continue the opioid monitoring program, this consists of regular clinic visits, examinations, urine drug screen, pill counts as well as use of [place] Controlled Substance Reporting System. 2. Fibromyalgia. Continue Current exercise Regime.01/13/2020 3. Anxiety and depression:[facility]ry [facility] Continue Counseling at [facility].01/13/2020 4. Migraines: On Maxalt. Neurology Following.01/13/2020 5. OSA :PCP Following. Continue to Monitor.01/13/2020 6. Obesity: Following Healthy Diet Regimenand Continue HEP as Tolerated.01/13/2020 7. Status Post Cervical Spinal Fusion: C5C6- C-6- C-7 Anterior Cervical Discectomy Fusion and Allograft Plate:Dr. Lorin Mercy Following.01/13/2020 8. Cervicalgia/ Cervical Radiculitis/ S/P Cervical Spinal Fusion: Continueto MonitorDr. Lorin Mercy Following.01/13/2020 9. Muscle Spasm: Continue Tizanidine as needed.01/13/2020 10.Hereditary and Idiopathic Peripheral Neuropathy Continue with Tens Unit.01/13/2020. 11.LeftGreater Trochanteric Bursitis:Ms. Garzon agrees with Left Hip Injection.S/P Left Hip Injection with Dr. Ranell Patrick with good relief. Continue to Alternate Ice andheat Therapy. Continue to Monitor.01/13/2020. 12. Chronic Bilateral Knee Pain:No Complaints Today.Continue HEP as Tolerated. Continue to Monitor.01/13/2020 13. Lateral Epicondylitis of Right Elbow: Ortho Following, she reports.   58minutes of face to face patient care time was spent during this visit. All questions were encouraged and answered.  F/U in 1 month

## 2020-01-18 DIAGNOSIS — F411 Generalized anxiety disorder: Secondary | ICD-10-CM | POA: Diagnosis not present

## 2020-01-18 DIAGNOSIS — F332 Major depressive disorder, recurrent severe without psychotic features: Secondary | ICD-10-CM | POA: Diagnosis not present

## 2020-01-20 MED FILL — HYDROCODON-APAP 10-325: 10-325 | 30 days supply | Qty: 90 | Fill #0

## 2020-01-26 ENCOUNTER — Ambulatory Visit (INDEPENDENT_AMBULATORY_CARE_PROVIDER_SITE_OTHER): Payer: Medicare Other

## 2020-01-26 ENCOUNTER — Telehealth: Payer: Self-pay

## 2020-01-26 ENCOUNTER — Other Ambulatory Visit: Payer: Self-pay

## 2020-01-26 DIAGNOSIS — J301 Allergic rhinitis due to pollen: Secondary | ICD-10-CM

## 2020-01-26 DIAGNOSIS — R7303 Prediabetes: Secondary | ICD-10-CM | POA: Diagnosis not present

## 2020-01-26 DIAGNOSIS — E7800 Pure hypercholesterolemia, unspecified: Secondary | ICD-10-CM | POA: Diagnosis not present

## 2020-01-26 DIAGNOSIS — K219 Gastro-esophageal reflux disease without esophagitis: Secondary | ICD-10-CM | POA: Diagnosis not present

## 2020-01-26 DIAGNOSIS — G43009 Migraine without aura, not intractable, without status migrainosus: Secondary | ICD-10-CM | POA: Diagnosis not present

## 2020-01-26 NOTE — Telephone Encounter (Signed)
Patient cancelled appointment on 02/16/2020. klh

## 2020-02-03 DIAGNOSIS — J301 Allergic rhinitis due to pollen: Secondary | ICD-10-CM | POA: Diagnosis not present

## 2020-02-09 ENCOUNTER — Other Ambulatory Visit: Payer: Self-pay

## 2020-02-09 ENCOUNTER — Ambulatory Visit (INDEPENDENT_AMBULATORY_CARE_PROVIDER_SITE_OTHER): Payer: Medicare Other

## 2020-02-09 DIAGNOSIS — J301 Allergic rhinitis due to pollen: Secondary | ICD-10-CM | POA: Diagnosis not present

## 2020-02-09 DIAGNOSIS — Z1231 Encounter for screening mammogram for malignant neoplasm of breast: Secondary | ICD-10-CM | POA: Diagnosis not present

## 2020-02-09 DIAGNOSIS — M810 Age-related osteoporosis without current pathological fracture: Secondary | ICD-10-CM | POA: Diagnosis not present

## 2020-02-09 DIAGNOSIS — Z780 Asymptomatic menopausal state: Secondary | ICD-10-CM | POA: Diagnosis not present

## 2020-02-09 MED ORDER — EPIPEN 2-PAK 0.3 MG/0.3ML IJ SOAJ: INTRAMUSCULAR | 1 refills | Status: DC

## 2020-02-15 ENCOUNTER — Encounter: Payer: Self-pay | Admitting: Registered Nurse

## 2020-02-15 ENCOUNTER — Encounter: Payer: Medicare Other | Admitting: Registered Nurse

## 2020-02-15 ENCOUNTER — Other Ambulatory Visit: Payer: Self-pay

## 2020-02-15 ENCOUNTER — Encounter: Payer: Medicare Other | Attending: Physical Medicine & Rehabilitation | Admitting: Registered Nurse

## 2020-02-15 VITALS — BP 131/90 | HR 75 | Temp 97.7°F | Ht 63.0 in | Wt 237.0 lb

## 2020-02-15 DIAGNOSIS — Z5181 Encounter for therapeutic drug level monitoring: Secondary | ICD-10-CM | POA: Insufficient documentation

## 2020-02-15 DIAGNOSIS — M542 Cervicalgia: Secondary | ICD-10-CM | POA: Diagnosis not present

## 2020-02-15 DIAGNOSIS — R202 Paresthesia of skin: Secondary | ICD-10-CM | POA: Insufficient documentation

## 2020-02-15 DIAGNOSIS — M7061 Trochanteric bursitis, right hip: Secondary | ICD-10-CM | POA: Diagnosis not present

## 2020-02-15 DIAGNOSIS — M5416 Radiculopathy, lumbar region: Secondary | ICD-10-CM | POA: Diagnosis not present

## 2020-02-15 DIAGNOSIS — M5412 Radiculopathy, cervical region: Secondary | ICD-10-CM | POA: Diagnosis not present

## 2020-02-15 DIAGNOSIS — Z981 Arthrodesis status: Secondary | ICD-10-CM | POA: Diagnosis not present

## 2020-02-15 DIAGNOSIS — R200 Anesthesia of skin: Secondary | ICD-10-CM | POA: Insufficient documentation

## 2020-02-15 DIAGNOSIS — G894 Chronic pain syndrome: Secondary | ICD-10-CM | POA: Diagnosis not present

## 2020-02-15 DIAGNOSIS — M7062 Trochanteric bursitis, left hip: Secondary | ICD-10-CM | POA: Insufficient documentation

## 2020-02-15 DIAGNOSIS — M47816 Spondylosis without myelopathy or radiculopathy, lumbar region: Secondary | ICD-10-CM | POA: Diagnosis not present

## 2020-02-15 DIAGNOSIS — M797 Fibromyalgia: Secondary | ICD-10-CM | POA: Insufficient documentation

## 2020-02-15 DIAGNOSIS — N390 Urinary tract infection, site not specified: Secondary | ICD-10-CM | POA: Diagnosis not present

## 2020-02-15 DIAGNOSIS — Z79899 Other long term (current) drug therapy: Secondary | ICD-10-CM

## 2020-02-15 MED ORDER — HYDROCODONE-ACETAMINOPHEN 10-325 MG PO TABS: ORAL_TABLET | ORAL | 0 refills | Status: DC

## 2020-02-15 NOTE — Progress Notes (Signed)
Subjective:    Patient ID: Karina Greer, female    DOB: 1966-04-27, 54 y.o.   MRN: [id number]  HPI: Karina Greer is a 54 y.o. female who returns for follow up appointment for chronic pain and medication refill. She states her pain is located in neck radiating into her bilateral shoulders, lower back pain radiating into her bilateral hips. She rates her pain 6. Her current exercise regime is walking and performing stretching exercises.  [name] equivalent is 30.00  MME.  Last Oral Swab was Performed on 12/14/2019, it was consistent.    Pain Inventory Average Pain 9 Pain Right Now 6 My pain is intermittent, constant and stabbing  In the last 24 hours, has pain interfered with the following? General activity 7 Relation with others 8 Enjoyment of life 6 What TIME of day is your pain at its worst? all Sleep (in general) Fair  Pain is worse with: walking, bending, sitting, inactivity, standing and some activites Pain improves with: rest, heat/ice, therapy/exercise, pacing activities, medication, TENS and injections Relief from Meds: 6  Mobility walk without assistance how many minutes can you walk? 15 ability to climb steps?  yes do you drive?  yes Do you have any goals in this area?  yes  Function Do you have any goals in this area?  yes  Neuro/Psych numbness dizziness  Prior Studies Any changes since last visit?  no  Physicians involved in your care Any changes since last visit?  no   Family History  Problem Relation Age of Onset  . Hypertension Mother   . Diabetes Father   . Brain cancer Father   . Fibromyalgia Sister   . Suicidality Sister   . Hypertension Other        Cancer, Cerebrovascular disease run on mother side of family   Social History   Socioeconomic History  . Marital status: Divorced    Spouse name: Not on file  . Number of children: 3  . Years of education: HS  . Highest education level: 12th grade  Occupational History    . Occupation: Merchandiser, retail: UNEMPLOYED    Comment: Disability  Tobacco Use  . Smoking status: Never Smoker  . Smokeless tobacco: Never Used  Substance and Sexual Activity  . Alcohol use: No  . Drug use: No  . Sexual activity: Not on file  Other Topics Concern  . Not on file  Social History Narrative   Patient is right-handed. She avoids caffeine. She has recently been using the treadmill.   Social Determinants of Health   Financial Resource Strain:   . Difficulty of Paying Living Expenses:   Food Insecurity:   . Worried About Charity fundraiser in the Last Year:   . Arboriculturist in the Last Year:   Transportation Needs:   . Film/video editor (Medical):   [name] Lack of Transportation (Non-Medical):   Physical Activity:   . Days of Exercise per Week:   . Minutes of Exercise per Session:   Stress:   . Feeling of Stress :   Social Connections:   . Frequency of Communication with Friends and Family:   . Frequency of Social Gatherings with Friends and Family:   . Attends Religious Services:   . Active Member of Clubs or Organizations:   . Attends Archivist Meetings:   [name] Marital Status:    Past Surgical History:  Procedure Laterality Date  . ACHILLES TENDON  SURGERY Right 3/16  . ANTERIOR CERVICAL DECOMP/DISCECTOMY FUSION N/A 03/11/2016   Procedure: C5-6, C6-7 Anterior Cervical Discectomy and Fusion, Allograft, Plate;  Surgeon: Marybelle Killings, MD;  Location: [place];  Service: Orthopedics;  Laterality: N/A;  . CHOLECYSTECTOMY  2014  . COLONOSCOPY WITH PROPOFOL N/A 08/25/2017   Procedure: COLONOSCOPY WITH PROPOFOL;  Surgeon: Manya Silvas, MD;  Location: [facility] ENDOSCOPY;  Service: Endoscopy;  Laterality: N/A;  . CYSTO WITH HYDRODISTENSION N/A 01/26/2014   Procedure: CYSTOSCOPY/HYDRODISTENSION;  Surgeon: Bernestine Amass, MD;  Location: [facility];  Service: Urology;  Laterality: N/A;  . CYSTO/  URETHRAL DILATION/  HYDRODISTENTION/    INSTILLATION THERAPY  07-16-2010//   12-30-2007//   10-27-2006  . EXERCISE TOLERENCE TEST  10-12-2010   NEGATIVE  ADEQUATE ETT/  NO ISCHEMIA OR EVIDENCE HIGH GRADE OBSTRUCTIVE CAD/  NO FURTHER TEST NEEDED  . [place] ENDOMETRIAL ABLATION  2014  . LAPAROSCOPIC OVARIAN CYST BX  2005   AND  URETEROSCOPIC LASER LITHO  STONE EXTRACTION  . SHOULDER OPEN ROTATOR CUFF REPAIR Right 2004  . TONSILLECTOMY    . TRANSTHORACIC ECHOCARDIOGRAM  06-07-2006   normal study/  ef 60-65%  . TUBAL LIGATION Bilateral 1995   Past Medical History:  Diagnosis Date  . Anxiety   . Anxiety disorder   . Arthritis   . Blepharitis   . Chronic low back pain   . Depression   . Dyslipidemia   . Fibromyalgia   . GERD (gastroesophageal reflux disease)   . Headache    hx migraines  . History of kidney stones   . History of panic attacks   . History of renal calculi   . IBS (irritable bowel syndrome)   . Interstitial cystitis   . Lupus ([place])    tested positive for antibodies for lupus. dr to do further tests  . Menorrhagia   . OSA on CPAP    not used cpap for several weeks  . Pneumonia    hx  . PONV (postoperative nausea and vomiting)   . RLS (restless legs syndrome)   . Rosacea   . Seasonal asthma   . SI (sacroiliac) joint dysfunction   . SUI (stress urinary incontinence, female)   . UTI (lower urinary tract infection)    hx  . 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 02/23/2013   BP 131/90   Pulse 75   Temp 97.7 F (36.5 C)   Ht 5\' 3"  (1.6 m)   Wt 237 lb (107.5 kg)   SpO2 96%   BMI 41.98 kg/m   Opioid Risk Score:   Fall Risk Score:  `1  Depression screen PHQ 2/9  Depression screen [facility] 2/9 08/18/2019 06/21/2019 08/12/2018 07/20/2018 06/19/2018 04/20/2018 01/29/2018  Decreased Interest 2 0 0 1 1 0 0  Down, Depressed, Hopeless 2 0 0 1 1 0 0  PHQ - 2 Score 4 0 0 2 2 0 0  Altered sleeping - - - - - - -  Tired, decreased energy - - - - - - -  Change in appetite - - - - - - -    Feeling bad or failure about yourself  - - - - - - -  Trouble concentrating - - - - - - -  Moving slowly or fidgety/restless - - - - - - -  Suicidal thoughts - - - - - - -  PHQ-9 Score - - - - - - -  Difficult doing  work/chores - - - - - - -  Some recent data might be hidden    Review of Systems  Constitutional: Negative.   HENT: Negative.   Eyes: Negative.   Respiratory: Negative.   Cardiovascular: Negative.   Gastrointestinal: Negative.   Endocrine: Negative.   Genitourinary: Negative.   Musculoskeletal: Positive for back pain.  Skin: Negative.   Allergic/Immunologic: Negative.   Neurological: Positive for dizziness and numbness.  All other systems reviewed and are negative.      Objective:   Physical Exam Vitals and nursing note reviewed.  Constitutional:      Appearance: Normal appearance.  Neck:     Comments: Cervical Paraspinal Tenderness: C-5-C-6 Cardiovascular:     Rate and Rhythm: Normal rate and regular rhythm.     Pulses: Normal pulses.     Heart sounds: Normal heart sounds.  Pulmonary:     Effort: Pulmonary effort is normal.     Breath sounds: Normal breath sounds.  Musculoskeletal:     Cervical back: Normal range of motion and neck supple.     Comments: Normal Muscle Bulk and Muscle Testing Reveals:  Upper Extremities: Full ROM and Muscle Strength 5/5 Bilateral AC Joint Tenderness Lumbar Paraspinal Tenderness: L-3-L-5 Bilateral Greater Trochanteric Tenderness Lower Extremities: Full ROM and Muscle Strength 5/5 Arises from Chair with ease Narrow Based Gait   Skin:    General: Skin is warm and dry.  Neurological:     Mental Status: She is alert and oriented to person, place, and time.  Psychiatric:        Mood and Affect: Mood normal.        Behavior: Behavior normal.           Assessment & Plan:  1. Lumbago/ Lumbar Spondylosis/LeftLumbar Radiculitis: Continue to Monitor.02/15/2020. Refilled: HYDROcodone 10/325mg  one tablet  every8hours as needed #90.  We will continue the opioid monitoring program, this consists of regular clinic visits, examinations, urine drug screen, pill counts as well as use of [place] Controlled Substance Reporting System. 2. Fibromyalgia. Continue Current exercise Regime.02/15/2020 3. Anxiety and depression:[facility]iatry [facility]. Continue Counseling at [facility].02/15/2020 4. Migraines: On Maxalt. Neurology Following.02/15/2020 5. OSA :PCP Following. Continue to Monitor.02/15/2020 6. Obesity: Following Healthy Diet Regimenand Continue HEP as Tolerated.02/15/2020 7. Status Post Cervical Spinal Fusion: C5C6- C-6- C-7 Anterior Cervical Discectomy Fusion and Allograft Plate:Dr. Lorin Mercy Following.02/15/2020 8. Cervicalgia/ Cervical Radiculitis/ S/P Cervical Spinal Fusion: Continueto MonitorDr. Yates Following.02/15/2020 9. Muscle Spasm: Continue Tizanidine as needed.02/15/2020 10.Hereditary and Idiopathic Peripheral Neuropathy Continue with Tens Unit.02/15/2020. 11.BilateralGreater Trochanteric Bursitis: Continue to Alternate Ice andheat Therapy. Continue to Monitor.02/15/2020. 12. Chronic Bilateral Knee Pain:No Complaints Today.Continue HEP as Tolerated. Continue to Monitor.02/15/2020 13. Lateral Epicondylitis of Right Elbow: No complaints today. Ortho Following, she reports. 02/15/2020.  48minutes of face to face patient care time was spent during this visit. All questions were encouraged and answered.  F/U in 1 month

## 2020-02-16 ENCOUNTER — Ambulatory Visit: Payer: Medicare Other

## 2020-02-18 ENCOUNTER — Other Ambulatory Visit: Payer: Self-pay | Admitting: Adult Health

## 2020-02-18 ENCOUNTER — Other Ambulatory Visit: Payer: Self-pay

## 2020-02-18 MED ORDER — EPINEPHRINE 0.3 MG/0.3ML IJ SOAJ: INTRAMUSCULAR | 1 refills | Status: DC

## 2020-02-18 MED FILL — HYDROCODON-APAP 10-325: 10-325 | 30 days supply | Qty: 90 | Fill #0

## 2020-02-23 ENCOUNTER — Ambulatory Visit: Payer: Medicare Other

## 2020-02-24 DIAGNOSIS — H10829 Rosacea conjunctivitis, unspecified eye: Secondary | ICD-10-CM | POA: Diagnosis not present

## 2020-03-01 ENCOUNTER — Other Ambulatory Visit: Payer: Self-pay

## 2020-03-01 ENCOUNTER — Ambulatory Visit (INDEPENDENT_AMBULATORY_CARE_PROVIDER_SITE_OTHER): Payer: Medicare Other

## 2020-03-01 DIAGNOSIS — J301 Allergic rhinitis due to pollen: Secondary | ICD-10-CM | POA: Diagnosis not present

## 2020-03-07 ENCOUNTER — Telehealth: Payer: Self-pay

## 2020-03-07 NOTE — Telephone Encounter (Signed)
Confirmed appointment on 03/09/2020 and screened for covid. klh  

## 2020-03-08 ENCOUNTER — Telehealth: Payer: Self-pay

## 2020-03-08 NOTE — Telephone Encounter (Signed)
Patient cancelled appointment on 03/09/2020 will call back to reschedule. klh

## 2020-03-09 ENCOUNTER — Ambulatory Visit: Payer: Medicare Other | Admitting: Internal Medicine

## 2020-03-15 ENCOUNTER — Ambulatory Visit: Payer: Medicare Other

## 2020-03-15 ENCOUNTER — Ambulatory Visit: Payer: Medicare Other | Admitting: Physical Medicine and Rehabilitation

## 2020-03-17 ENCOUNTER — Other Ambulatory Visit: Payer: Self-pay

## 2020-03-17 ENCOUNTER — Encounter: Payer: Self-pay | Admitting: Registered Nurse

## 2020-03-17 ENCOUNTER — Encounter: Payer: Medicare Other | Attending: Physical Medicine & Rehabilitation | Admitting: Registered Nurse

## 2020-03-17 VITALS — BP 124/80 | HR 75 | Temp 97.9°F | Ht 63.0 in | Wt 236.8 lb

## 2020-03-17 DIAGNOSIS — N390 Urinary tract infection, site not specified: Secondary | ICD-10-CM | POA: Diagnosis present

## 2020-03-17 DIAGNOSIS — M7062 Trochanteric bursitis, left hip: Secondary | ICD-10-CM | POA: Diagnosis present

## 2020-03-17 DIAGNOSIS — M542 Cervicalgia: Secondary | ICD-10-CM | POA: Diagnosis not present

## 2020-03-17 DIAGNOSIS — Z981 Arthrodesis status: Secondary | ICD-10-CM

## 2020-03-17 DIAGNOSIS — M7711 Lateral epicondylitis, right elbow: Secondary | ICD-10-CM

## 2020-03-17 DIAGNOSIS — Z79899 Other long term (current) drug therapy: Secondary | ICD-10-CM

## 2020-03-17 DIAGNOSIS — G894 Chronic pain syndrome: Secondary | ICD-10-CM

## 2020-03-17 DIAGNOSIS — M7061 Trochanteric bursitis, right hip: Secondary | ICD-10-CM

## 2020-03-17 DIAGNOSIS — M47816 Spondylosis without myelopathy or radiculopathy, lumbar region: Secondary | ICD-10-CM | POA: Diagnosis not present

## 2020-03-17 DIAGNOSIS — M797 Fibromyalgia: Secondary | ICD-10-CM

## 2020-03-17 DIAGNOSIS — R202 Paresthesia of skin: Secondary | ICD-10-CM | POA: Diagnosis present

## 2020-03-17 DIAGNOSIS — Z5181 Encounter for therapeutic drug level monitoring: Secondary | ICD-10-CM | POA: Diagnosis present

## 2020-03-17 DIAGNOSIS — R200 Anesthesia of skin: Secondary | ICD-10-CM | POA: Insufficient documentation

## 2020-03-17 MED ORDER — HYDROCODONE-ACETAMINOPHEN 10-325 MG PO TABS: ORAL_TABLET | ORAL | 0 refills | Status: DC

## 2020-03-17 MED ORDER — TIZANIDINE HCL 2 MG PO TABS: 2.0000 mg | ORAL_TABLET | Freq: Three times a day (TID) | ORAL | 2 refills | Status: DC | PRN

## 2020-03-17 MED FILL — tiZANidine HCL 2 MG TABS: 2 | 17 days supply | Qty: 50 | Fill #0

## 2020-03-17 MED FILL — HYDROCODON-APAP 10-325: 10-325 | 30 days supply | Qty: 90 | Fill #0

## 2020-03-17 NOTE — Progress Notes (Signed)
Subjective:    Patient ID: Karina Greer, female    DOB: 10-09-1965, 54 y.o.   MRN: [id number]  HPI: Karina Greer is a 53 y.o. female who returns for follow up appointment for chronic pain and medication refill. She states her pain is located in her neck, lower back pain and right hip pain. Also reports right elbow pain.Karina Greer request right hip injection, will scheduled her for right hi[p injection with Dr Ranell Patrick, she verbalizes understanding. She rates her pain 7. Her current exercise regime is walking and performing stretching exercises.  Karina Greer Morphine equivalent is 30.00 MME.    Last Oral Swab was Performed on 12/14/2019, it was consistent.   Pain Inventory Average Pain 9 Pain Right Now 7 My pain is constant, sharp, stabbing and aching  In the last 24 hours, has pain interfered with the following? General activity 8 Relation with others 8 Enjoyment of life 8 What TIME of day is your pain at its worst? all Sleep (in general) Fair  Pain is worse with: walking, bending, sitting, inactivity, standing and some activites Pain improves with: rest, heat/ice, therapy/exercise, pacing activities, medication and TENS Relief from Meds: 7  Mobility walk without assistance how many minutes can you walk? 15 ability to climb steps?  yes do you drive?  yes Do you have any goals in this area?  yes  Function Do you have any goals in this area?  yes  Neuro/Psych numbness dizziness  Prior Studies Any changes since last visit?  no  Physicians involved in your care Any changes since last visit?  no   Family History  Problem Relation Age of Onset  . Hypertension Mother   . Diabetes Father   . Brain cancer Father   . Fibromyalgia Sister   . Suicidality Sister   . Hypertension Other        Cancer, Cerebrovascular disease run on mother side of family   Social History   Socioeconomic History  . Marital status: Divorced    Spouse name: Not on file  . Number of  children: 3  . Years of education: HS  . Highest education level: 12th grade  Occupational History  . Occupation: Merchandiser, retail: UNEMPLOYED    Comment: Disability  Tobacco Use  . Smoking status: Never Smoker  . Smokeless tobacco: Never Used  Vaping Use  . Vaping Use: Never used  Substance and Sexual Activity  . Alcohol use: No  . Drug use: No  . Sexual activity: Not on file  Other Topics Concern  . Not on file  Social History Narrative   Patient is right-handed. She avoids caffeine. She has recently been using the treadmill.   Social Determinants of Health   Financial Resource Strain:   . Difficulty of Paying Living Expenses:   Food Insecurity:   . Worried About Charity fundraiser in the Last Year:   . Arboriculturist in the Last Year:   Transportation Needs:   . Film/video editor (Medical):   [name] Lack of Transportation (Non-Medical):   Physical Activity:   . Days of Exercise per Week:   . Minutes of Exercise per Session:   Stress:   . Feeling of Stress :   Social Connections:   . Frequency of Communication with Friends and Family:   . Frequency of Social Gatherings with Friends and Family:   . Attends Religious Services:   . Active Member of Clubs or Organizations:   .  Attends Archivist Meetings:   [name] Marital Status:    Past Surgical History:  Procedure Laterality Date  . ACHILLES TENDON SURGERY Right 3/16  . ANTERIOR CERVICAL DECOMP/DISCECTOMY FUSION N/A 03/11/2016   Procedure: C5-6, C6-7 Anterior Cervical Discectomy and Fusion, Allograft, Plate;  Surgeon: Marybelle Killings, MD;  Location: [place];  Service: Orthopedics;  Laterality: N/A;  . CHOLECYSTECTOMY  2014  . COLONOSCOPY WITH PROPOFOL N/A 08/25/2017   Procedure: COLONOSCOPY WITH PROPOFOL;  Surgeon: Manya Silvas, MD;  Location: [facility] ENDOSCOPY;  Service: Endoscopy;  Laterality: N/A;  . CYSTO WITH HYDRODISTENSION N/A 01/26/2014   Procedure: CYSTOSCOPY/HYDRODISTENSION;  Surgeon: Bernestine Amass, MD;  Location: [facility];  Service: Urology;  Laterality: N/A;  . CYSTO/  URETHRAL DILATION/  HYDRODISTENTION/   INSTILLATION THERAPY  07-16-2010//   12-30-2007//   10-27-2006  . EXERCISE TOLERENCE TEST  10-12-2010   NEGATIVE  ADEQUATE ETT/  NO ISCHEMIA OR EVIDENCE HIGH GRADE OBSTRUCTIVE CAD/  NO FURTHER TEST NEEDED  . [place] ENDOMETRIAL ABLATION  2014  . LAPAROSCOPIC OVARIAN CYST BX  2005   AND  URETEROSCOPIC LASER LITHO  STONE EXTRACTION  . SHOULDER OPEN ROTATOR CUFF REPAIR Right 2004  . TONSILLECTOMY    . TRANSTHORACIC ECHOCARDIOGRAM  06-07-2006   normal study/  ef 60-65%  . TUBAL LIGATION Bilateral 1995   Past Medical History:  Diagnosis Date  . Anxiety   . Anxiety disorder   . Arthritis   . Blepharitis   . Chronic low back pain   . Depression   . Dyslipidemia   . Fibromyalgia   . GERD (gastroesophageal reflux disease)   . Headache    hx migraines  . History of kidney stones   . History of panic attacks   . History of renal calculi   . IBS (irritable bowel syndrome)   . Interstitial cystitis   . Lupus ([place])    tested positive for antibodies for lupus. dr to do further tests  . Menorrhagia   . OSA on CPAP    not used cpap for several weeks  . Pneumonia    hx  . PONV (postoperative nausea and vomiting)   . RLS (restless legs syndrome)   . Rosacea   . Seasonal asthma   . SI (sacroiliac) joint dysfunction   . SUI (stress urinary incontinence, female)   . UTI (lower urinary tract infection)    hx  . 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 02/23/2013   BP 124/80   Pulse 75   Temp 97.9 F (36.6 C)   Ht 5\' 3"  (1.6 m)   Wt 236 lb 12.8 oz (107.4 kg)   SpO2 97%   BMI 41.95 kg/m   Opioid Risk Score:   Fall Risk Score:  `1  Depression screen PHQ 2/9  Depression screen [facility] 2/9 08/18/2019 06/21/2019 08/12/2018 07/20/2018 06/19/2018 04/20/2018 01/29/2018  Decreased Interest 2 0 0 1 1 0 0  Down, Depressed, Hopeless 2 0 0 1 1 0  0  PHQ - 2 Score 4 0 0 2 2 0 0  Altered sleeping - - - - - - -  Tired, decreased energy - - - - - - -  Change in appetite - - - - - - -  Feeling bad or failure about yourself  - - - - - - -  Trouble concentrating - - - - - - -  Moving slowly or fidgety/restless - - - - - - -  Suicidal thoughts - - - - - - -  PHQ-9 Score - - - - - - -  Difficult doing work/chores - - - - - - -  Some recent data might be hidden    Review of Systems  Constitutional: Negative.   HENT: Negative.   Eyes: Negative.   Respiratory: Negative.   Cardiovascular: Negative.   Gastrointestinal: Negative.   Endocrine: Negative.   Genitourinary: Negative.   Musculoskeletal: Positive for back pain.  Skin: Negative.   Allergic/Immunologic: Negative.   Neurological: Positive for dizziness and numbness.  Hematological: Negative.   Psychiatric/Behavioral: Positive for dysphoric mood.  All other systems reviewed and are negative.      Objective:   Physical Exam Vitals and nursing note reviewed.  Constitutional:      Appearance: Normal appearance.  Neck:     Comments: Cervical Paraspinal Tenderness: C-5-C-6 Cardiovascular:     Rate and Rhythm: Normal rate and regular rhythm.     Pulses: Normal pulses.     Heart sounds: Normal heart sounds.  Pulmonary:     Effort: Pulmonary effort is normal.     Breath sounds: Normal breath sounds.  Musculoskeletal:     Cervical back: Normal range of motion and neck supple.     Comments: Normal Muscle Bulk and Muscle Testing Reveals: Upper Extremities: Full ROM and Muscle Strength 5/5 Bilateral AC Joint Tenderness Lumbar Paraspinal Tenderness: L-3-L-5 Right Greater Trochanter Tenderness Lower Extremities: Full ROM and Muscle Strength 5/5 Arises from chair with ease Narrow Based  Gait   Skin:    General: Skin is warm and dry.  Neurological:     Mental Status: She is alert and oriented to person, place, and time.  Psychiatric:        Mood and Affect: Mood normal.         Behavior: Behavior normal.           Assessment & Plan:  1. Lumbago/ Lumbar Spondylosis/LeftLumbar Radiculitis: Continue to Monitor.03/17/2020. Refilled: HYDROcodone 10/325mg  one tablet every8hours as needed #90.  We will continue the opioid monitoring program, this consists of regular clinic visits, examinations, urine drug screen, pill counts as well as use of [place] Controlled Substance Reporting System. 2. Fibromyalgia. Continue Current exercise Regime.03/17/2020 3. Anxiety and depression:Psychiatry [facility]. Continue Counseling at [facility].03/17/2020 4. Migraines: On Maxalt. Neurology Following.03/17/2020 5. OSA :PCP Following. Continue to Monitor.03/17/2020 6. Obesity: Following Healthy Diet Regimenand Continue HEP as Tolerated.03/17/2020 7. Status Post Cervical Spinal Fusion: C5C6- C-6- C-7 Anterior Cervical Discectomy Fusion and Allograft Plate:Dr. Lorin Mercy Following.03/17/2020 8. Cervicalgia/ Cervical Radiculitis/ S/P Cervical Spinal Fusion: Continueto MonitorDr. Lorin Mercy Following.03/17/2020 9. Muscle Spasm: Continue Tizanidine as needed.03/17/2020 10.Hereditary and Idiopathic Peripheral Neuropathy Continue with Tens Unit.03/17/2020. 11.BilateralGreater Trochanteric Bursitis:Continue to Alternate Ice andheat Therapy. Continue to Monitor.03/17/2020. 12. Chronic Bilateral Knee Pain:No Complaints Today.Continue HEP as Tolerated. Continue to Monitor.03/17/2020 13. Lateral Epicondylitis of Right Elbow:  Ortho Following, she reports.03/17/2020.  61minutes of face to face patient care time was spent during this visit. All questions were encouraged and answered.  F/U in 1 month

## 2020-03-21 ENCOUNTER — Encounter (HOSPITAL_BASED_OUTPATIENT_CLINIC_OR_DEPARTMENT_OTHER): Payer: Medicare Other | Admitting: Physical Medicine and Rehabilitation

## 2020-03-21 ENCOUNTER — Other Ambulatory Visit: Payer: Self-pay

## 2020-03-21 ENCOUNTER — Encounter: Payer: Self-pay | Admitting: Physical Medicine and Rehabilitation

## 2020-03-21 VITALS — BP 146/86 | HR 73 | Temp 97.9°F | Ht 63.0 in | Wt 235.8 lb

## 2020-03-21 DIAGNOSIS — M797 Fibromyalgia: Secondary | ICD-10-CM | POA: Diagnosis not present

## 2020-03-21 DIAGNOSIS — M7061 Trochanteric bursitis, right hip: Secondary | ICD-10-CM

## 2020-03-21 NOTE — Progress Notes (Signed)
Trochanteric bursa injection Without ultrasound guidance  Indication R Trochanteric bursitis. Exam has tenderness over the greater trochanter of the hip. Pain has not responded to conservative care such as exercise therapy and oral medications. Pain interferes with sleep or with mobility Informed consent was obtained after describing risks and benefits of the procedure with the patient these include bleeding bruising and infection. Patient has signed written consent form. Patient placed in a lateral decubitus position with the affected hip superior. Point of maximal pain was palpated marked and prepped with Betadine and entered with a needle to bone contact. Needle slightly withdrawn then 6mg  of betamethasone with 3 cc 1% lidocaine were injected. Patient tolerated procedure well. Post procedure instructions given.

## 2020-03-22 ENCOUNTER — Ambulatory Visit (INDEPENDENT_AMBULATORY_CARE_PROVIDER_SITE_OTHER): Payer: Medicare Other

## 2020-03-22 DIAGNOSIS — J301 Allergic rhinitis due to pollen: Secondary | ICD-10-CM | POA: Diagnosis not present

## 2020-03-23 ENCOUNTER — Ambulatory Visit: Payer: Medicare Other | Admitting: Surgery

## 2020-04-03 DIAGNOSIS — F332 Major depressive disorder, recurrent severe without psychotic features: Secondary | ICD-10-CM | POA: Diagnosis not present

## 2020-04-03 DIAGNOSIS — F411 Generalized anxiety disorder: Secondary | ICD-10-CM | POA: Diagnosis not present

## 2020-04-05 ENCOUNTER — Other Ambulatory Visit: Payer: Self-pay

## 2020-04-05 ENCOUNTER — Ambulatory Visit (INDEPENDENT_AMBULATORY_CARE_PROVIDER_SITE_OTHER): Payer: Medicare Other

## 2020-04-05 DIAGNOSIS — J301 Allergic rhinitis due to pollen: Secondary | ICD-10-CM | POA: Diagnosis not present

## 2020-04-06 DIAGNOSIS — E669 Obesity, unspecified: Secondary | ICD-10-CM | POA: Diagnosis not present

## 2020-04-06 DIAGNOSIS — Z9889 Other specified postprocedural states: Secondary | ICD-10-CM | POA: Diagnosis not present

## 2020-04-06 DIAGNOSIS — Z923 Personal history of irradiation: Secondary | ICD-10-CM | POA: Diagnosis not present

## 2020-04-06 DIAGNOSIS — G521 Disorders of glossopharyngeal nerve: Secondary | ICD-10-CM | POA: Diagnosis not present

## 2020-04-11 ENCOUNTER — Ambulatory Visit (INDEPENDENT_AMBULATORY_CARE_PROVIDER_SITE_OTHER): Payer: Medicare Other

## 2020-04-11 ENCOUNTER — Ambulatory Visit (INDEPENDENT_AMBULATORY_CARE_PROVIDER_SITE_OTHER): Payer: Medicare Other | Admitting: Orthopaedic Surgery

## 2020-04-11 ENCOUNTER — Encounter: Payer: Self-pay | Admitting: Orthopaedic Surgery

## 2020-04-11 VITALS — BP 134/82 | HR 68 | Ht 63.0 in | Wt 230.0 lb

## 2020-04-11 DIAGNOSIS — M542 Cervicalgia: Secondary | ICD-10-CM | POA: Diagnosis not present

## 2020-04-11 DIAGNOSIS — M7711 Lateral epicondylitis, right elbow: Secondary | ICD-10-CM | POA: Diagnosis not present

## 2020-04-11 MED ORDER — LIDOCAINE HCL 1 % IJ SOLN
0.5000 mL | INTRAMUSCULAR | Status: AC | PRN
  Administered 2020-04-11: .5 mL

## 2020-04-11 MED ORDER — METHYLPREDNISOLONE ACETATE 40 MG/ML IJ SUSP
40.0000 mg | INTRAMUSCULAR | Status: AC | PRN
  Administered 2020-04-11: 40 mg via INTRA_ARTICULAR

## 2020-04-11 MED ORDER — BUPIVACAINE HCL 0.5 % IJ SOLN
1.0000 mL | INTRAMUSCULAR | Status: AC | PRN
  Administered 2020-04-11: 1 mL via INTRA_ARTICULAR

## 2020-04-11 NOTE — Progress Notes (Signed)
Office Visit Note   Patient: Karina Greer           Date of Birth: 1966-02-13           MRN: [id number] Visit Date: 04/11/2020              Requested by: Karina Bender, PA-C [address] [place],  [place] [address] PCP: Karina Bender, PA-C   Assessment & Plan: Visit Diagnoses:  1. Neck pain   2. Lateral epicondylitis, right elbow     Plan: Patient has chronic lateral epicondylitis this is her third injection.  Her neck symptoms have been bothering her for 3 weeks.  There is likely a flare of the spondylosis above her solid fusion.  Hopefully should get some relief using her tennis elbow brace and with this third injection.  She will let us know she does not get improvement.  Follow-Up Instructions: No follow-ups on file.   Orders:  Orders Placed This Encounter  Procedures  . XR Cervical Spine 2 or 3 views   No orders of the defined types were placed in this encounter.     Procedures: Medium Joint Inj: R lateral epicondyle on 04/11/2020 9:44 AM Indications: pain Details: 22 G 1.5 in needle, anterolateral approach Medications: 1 mL bupivacaine 0.5 %; 40 mg methylPREDNISolone acetate 40 MG/ML; 0.5 mL lidocaine 1 % Outcome: tolerated well, no immediate complications Procedure, treatment alternatives, risks and benefits explained, specific risks discussed. Consent was given by the patient. Immediately prior to procedure a time out was called to verify the correct patient, procedure, equipment, support staff and site/side marked as required. Patient was prepped and draped in the usual sterile fashion.       Clinical Data: No additional findings.   Subjective: Chief Complaint  Patient presents with  . Neck - Pain    HPI 54 year old female returns she states she has had ongoing problems with pain in her neck primarily on the right side over the paraspinal muscles that radiates into her shoulder.  She is also had problems with recurrent right lateral epicondylitis.   First injection gave her good relief for second she states really did not help that much.  She is right-hand dominant.  She denies any gait disturbance no myelopathic changes no numbness or tingling in the hand.  Previous cervical fusion C5-C7 solid with narrowing and spurring at C4-5.  She has had some problems with ear pain on the left and soft tissue MRI of the neck showed paracentral disc protrusion at C4-5 on the left.  Patient is on Norco 10/325 number 90 tablets a month with Dr. Tessa Greer pain management.  Review of Systems noncontributory as it pertains to HPI.   Objective: Vital Signs: BP 134/82   Pulse 68   Ht 5\' 3"  (1.6 m)   Wt 230 lb (104.3 kg)   BMI 40.74 kg/m   Physical Exam Constitutional:      Appearance: She is well-developed.  HENT:     Head: Normocephalic.     Right Ear: External ear normal.     Left Ear: External ear normal.  Eyes:     Pupils: Pupils are equal, round, and reactive to light.  Neck:     Thyroid: No thyromegaly.     Trachea: No tracheal deviation.  Cardiovascular:     Rate and Rhythm: Normal rate.  Pulmonary:     Effort: Pulmonary effort is normal.  Abdominal:     Palpations: Abdomen is soft.  Skin:  General: Skin is warm and dry.  Neurological:     Mental Status: She is alert and oriented to person, place, and time.  Psychiatric:        Behavior: Behavior normal.     Ortho Exam positive brachial plexus tenderness on the right positive Spurling on the right negative on the left.  Well-healed anterior neck incision.  Exquisite tenderness of the lateral epicondyle no tenderness of the medial epicondyle.  Pain on the right with resisted wrist extension.  Sensation the fingertips is intact.  Specialty Comments:  No specialty comments available.  Imaging: No results found.   PMFS History: Patient Active Problem List   Diagnosis Date Noted  . Lateral epicondylitis, right elbow 05/25/2019  . Atherosclerosis of aorta ([place]) 10/28/2018  .  Elevated glucose 10/28/2018  . Mixed hyperlipidemia 10/28/2018  . Hypothyroidism 07/20/2018  . LLQ pain 12/03/2017  . Pre-diabetes 06/03/2017  . Rectal bleeding 06/02/2017  . S/P cervical spinal fusion 03/11/2016  . Bilateral hand pain 11/29/2015  . Elevated C-reactive protein 11/29/2015  . Elevated rheumatoid factor 11/29/2015  . Raynaud's phenomenon without gangrene 11/29/2015  . Bloating 09/12/2014  . Dizziness 08/08/2014  . Peri-menopause 08/08/2014  . Chronic tension-type headache, intractable 07/13/2014  . Gastroesophageal reflux disease without esophagitis 07/12/2014  . Anxiety 04/18/2014  . Chronic arthritis 04/18/2014  . Cystitis 04/18/2014  . Morbid obesity ([place]) 04/18/2014  . Fibromyalgia 04/18/2014  . 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 Arthropathy 04/18/2014  . Cephalalgia 04/04/2014  . Numbness and tingling 04/04/2014  . 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 Nonspecific abnormal findings on radiological and other examination of skull and head 02/23/2013  . Other nonspecific abnormal result of function study of brain and central nervous system 07/01/2012  . Disturbance of skin sensation 07/01/2012  . Migraine without aura 07/01/2012  . Abdominal pain 02/11/2012  . Chronic fatigue syndrome 02/11/2012  . Chronic interstitial cystitis 02/11/2012  . Dyspareunia 02/11/2012  . Dysuria 02/11/2012  . Female stress incontinence 02/11/2012  . Irritable bowel syndrome with diarrhea 02/11/2012  . Nausea and vomiting 02/11/2012  . Nocturia 02/11/2012  . Urge incontinence 02/11/2012  . Urinary urgency 02/11/2012  . Lumbar spondylosis 02/04/2012  . Myalgia and myositis 02/04/2012  . Depression 02/04/2012  . Lumbosacral spondylosis without myelopathy 02/04/2012  . OBESITY, UNSPECIFIED 10/03/2010  . GENERALIZED ANXIETY DISORDER 10/03/2010  . Chest pain 10/03/2010   Past Medical History:    Diagnosis Date  . Anxiety   . Anxiety disorder   . Arthritis   . Blepharitis   . Chronic low back pain   . Depression   . Dyslipidemia   . Fibromyalgia   . GERD (gastroesophageal reflux disease)   . Headache    hx migraines  . History of kidney stones   . History of panic attacks   . History of renal calculi   . IBS (irritable bowel syndrome)   . Interstitial cystitis   . Lupus ([place])    tested positive for antibodies for lupus. dr to do further tests  . Menorrhagia   . OSA on CPAP    not used cpap for several weeks  . Pneumonia    hx  . PONV (postoperative nausea and vomiting)   . RLS (restless legs syndrome)   . Rosacea   . Seasonal asthma   . SI (sacroiliac) joint dysfunction   . SUI (stress urinary incontinence, female)   . UTI (lower urinary  tract infection)    hx  . 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 Hypertension Mother   . Diabetes Father   . Brain cancer Father   . Fibromyalgia Sister   . Suicidality Sister   . Hypertension Other        Cancer, Cerebrovascular disease run on mother side of family    Past Surgical History:  Procedure Laterality Date  . ACHILLES TENDON SURGERY Right 3/16  . ANTERIOR CERVICAL DECOMP/DISCECTOMY FUSION N/A 03/11/2016   Procedure: C5-6, C6-7 Anterior Cervical Discectomy and Fusion, Allograft, Plate;  Surgeon: Marybelle Killings, MD;  Location: [place];  Service: Orthopedics;  Laterality: N/A;  . CHOLECYSTECTOMY  2014  . COLONOSCOPY WITH PROPOFOL N/A 08/25/2017   Procedure: COLONOSCOPY WITH PROPOFOL;  Surgeon: Manya Silvas, MD;  Location[facility] ENDOSCOPY;  Service: Endoscopy;  Laterality: N/A;  . CYSTO WITH HYDRODISTENSION N/A 01/26/2014   Procedure: CYSTOSCOPY/HYDRODISTENSION;  Surgeon: Bernestine Amass, MD;  Location: [facility];  Service: Urology;  Laterality: N/A;  . CYSTO/  URETHRAL DILATION/  HYDRODISTENTION/   INSTILLATION THERAPY  07-16-2010//    12-30-2007//   10-27-2006  . EXERCISE TOLERENCE TEST  10-12-2010   NEGATIVE  ADEQUATE ETT/  NO ISCHEMIA OR EVIDENCE HIGH GRADE OBSTRUCTIVE CAD/  NO FURTHER TEST NEEDED  . [place] ENDOMETRIAL ABLATION  2014  . LAPAROSCOPIC OVARIAN CYST BX  2005   AND  URETEROSCOPIC LASER LITHO  STONE EXTRACTION  . SHOULDER OPEN ROTATOR CUFF REPAIR Right 2004  . TONSILLECTOMY    . TRANSTHORACIC ECHOCARDIOGRAM  06-07-2006   normal study/  ef 60-65%  . TUBAL LIGATION Bilateral 1995   Social History   Occupational History  . Occupation: Merchandiser, retail: UNEMPLOYED    Comment: Disability  Tobacco Use  . Smoking status: Never Smoker  . Smokeless tobacco: Never Used  Vaping Use  . Vaping Use: Never used  Substance and Sexual Activity  . Alcohol use: No  . Drug use: No  . Sexual activity: Not on file

## 2020-04-14 ENCOUNTER — Other Ambulatory Visit: Payer: Self-pay

## 2020-04-14 ENCOUNTER — Encounter: Payer: Self-pay | Admitting: Registered Nurse

## 2020-04-14 ENCOUNTER — Encounter: Payer: Medicare Other | Attending: Physical Medicine & Rehabilitation | Admitting: Registered Nurse

## 2020-04-14 VITALS — BP 134/88 | HR 67 | Temp 98.1°F | Ht 63.0 in | Wt 231.8 lb

## 2020-04-14 DIAGNOSIS — Z79899 Other long term (current) drug therapy: Secondary | ICD-10-CM | POA: Insufficient documentation

## 2020-04-14 DIAGNOSIS — M5416 Radiculopathy, lumbar region: Secondary | ICD-10-CM | POA: Diagnosis not present

## 2020-04-14 DIAGNOSIS — M47816 Spondylosis without myelopathy or radiculopathy, lumbar region: Secondary | ICD-10-CM | POA: Diagnosis not present

## 2020-04-14 DIAGNOSIS — M7061 Trochanteric bursitis, right hip: Secondary | ICD-10-CM | POA: Diagnosis not present

## 2020-04-14 DIAGNOSIS — M797 Fibromyalgia: Secondary | ICD-10-CM | POA: Insufficient documentation

## 2020-04-14 DIAGNOSIS — M542 Cervicalgia: Secondary | ICD-10-CM

## 2020-04-14 DIAGNOSIS — R200 Anesthesia of skin: Secondary | ICD-10-CM | POA: Diagnosis not present

## 2020-04-14 DIAGNOSIS — G894 Chronic pain syndrome: Secondary | ICD-10-CM | POA: Insufficient documentation

## 2020-04-14 DIAGNOSIS — Z5181 Encounter for therapeutic drug level monitoring: Secondary | ICD-10-CM | POA: Diagnosis not present

## 2020-04-14 DIAGNOSIS — R202 Paresthesia of skin: Secondary | ICD-10-CM | POA: Diagnosis not present

## 2020-04-14 DIAGNOSIS — M5412 Radiculopathy, cervical region: Secondary | ICD-10-CM | POA: Diagnosis not present

## 2020-04-14 DIAGNOSIS — M7062 Trochanteric bursitis, left hip: Secondary | ICD-10-CM | POA: Insufficient documentation

## 2020-04-14 DIAGNOSIS — Z981 Arthrodesis status: Secondary | ICD-10-CM | POA: Diagnosis not present

## 2020-04-14 DIAGNOSIS — N390 Urinary tract infection, site not specified: Secondary | ICD-10-CM | POA: Insufficient documentation

## 2020-04-14 MED ORDER — HYDROCODONE-ACETAMINOPHEN 10-325 MG PO TABS: ORAL_TABLET | ORAL | 0 refills | Status: DC

## 2020-04-14 NOTE — Progress Notes (Signed)
Subjective:    Patient ID: Karina Greer, female    DOB: 1966/01/16, 54 y.o.   MRN: [id number]  HPI: Karina Greer is a 54 y.o. female who returns for follow up appointment for chronic pain and medication refill. She states her pain is located in her neck radiating into her right shoulder, right arm with tingling and burning and lower back pain. [name] states she seen Dr Lorin Mercy and will be scheduled for neck surgery, she is awaiting a surgical date. She rates her pain 6. Her current exercise regime is walking and performing stretching exercises.  [name] Morphine equivalent is 30.00 MME.    Last Oral Swab was Performed on 12/14/2019, it was consistent.    Pain Inventory Average Pain 8 Pain Right Now 6 My pain is constant and aching  In the last 24 hours, has pain interfered with the following? General activity 5 Relation with others 6 Enjoyment of life 5 What TIME of day is your pain at its worst? Around the clock. Sleep (in general) Fair  Pain is worse with: walking, bending, sitting, inactivity, standing and some activites Pain improves with: rest, heat/ice, therapy/exercise, pacing activities, medication, TENS and injections Relief from Meds: 8  Mobility how many minutes can you walk? 30 mins ability to climb steps?  yes do you drive?  yes  Function disabled: date disabled I do not rememer. I need assistance with the following:  household duties, shopping and Son helps out at home. Do you have any goals in this area?  yes  Neuro/Psych bladder control problems weakness trouble walking spasms dizziness anxiety  Prior Studies Any changes since last visit?  yes x-rays Xray of neck last week.  Physicians involved in your care Any changes since last visit?  no   Family History  Problem Relation Age of Onset  . Hypertension Mother   . Diabetes Father   . Brain cancer Father   . Fibromyalgia Sister   . Suicidality Sister   . Hypertension Other         Cancer, Cerebrovascular disease run on mother side of family   Social History   Socioeconomic History  . Marital status: Divorced    Spouse name: Not on file  . Number of children: 3  . Years of education: HS  . Highest education level: 12th grade  Occupational History  . Occupation: Merchandiser, retail: UNEMPLOYED    Comment: Disability  Tobacco Use  . Smoking status: Never Smoker  . Smokeless tobacco: Never Used  Vaping Use  . Vaping Use: Never used  Substance and Sexual Activity  . Alcohol use: No  . Drug use: No  . Sexual activity: Not on file  Other Topics Concern  . Not on file  Social History Narrative   Patient is right-handed. She avoids caffeine. She has recently been using the treadmill.   Social Determinants of Health   Financial Resource Strain:   . Difficulty of Paying Living Expenses:   Food Insecurity:   . Worried About Charity fundraiser in the Last Year:   . Arboriculturist in the Last Year:   Transportation Needs:   . Film/video editor (Medical):   [name] Lack of Transportation (Non-Medical):   Physical Activity:   . Days of Exercise per Week:   . Minutes of Exercise per Session:   Stress:   . Feeling of Stress :   Social Connections:   . Frequency of Communication  with Friends and Family:   . Frequency of Social Gatherings with Friends and Family:   . Attends Religious Services:   . Active Member of Clubs or Organizations:   . Attends Archivist Meetings:   [name] Marital Status:    Past Surgical History:  Procedure Laterality Date  . ACHILLES TENDON SURGERY Right 3/16  . ANTERIOR CERVICAL DECOMP/DISCECTOMY FUSION N/A 03/11/2016   Procedure: C5-6, C6-7 Anterior Cervical Discectomy and Fusion, Allograft, Plate;  Surgeon: Marybelle Killings, MD;  Location: [facility];  Service: Orthopedics;  Laterality: N/A;  . CHOLECYSTECTOMY  2014  . COLONOSCOPY WITH PROPOFOL N/A 08/25/2017   Procedure: COLONOSCOPY WITH PROPOFOL;  Surgeon: Manya Silvas,  MD;  Location: [facility] ENDOSCOPY;  Service: Endoscopy;  Laterality: N/A;  . CYSTO WITH HYDRODISTENSION N/A 01/26/2014   Procedure: CYSTOSCOPY/HYDRODISTENSION;  Surgeon: Bernestine Amass, MD;  Location: [facility];  Service: Urology;  Laterality: N/A;  . CYSTO/  URETHRAL DILATION/  HYDRODISTENTION/   INSTILLATION THERAPY  07-16-2010//   12-30-2007//   10-27-2006  . EXERCISE TOLERENCE TEST  10-12-2010   NEGATIVE  ADEQUATE ETT/  NO ISCHEMIA OR EVIDENCE HIGH GRADE OBSTRUCTIVE CAD/  NO FURTHER TEST NEEDED  . [place] ENDOMETRIAL ABLATION  2014  . LAPAROSCOPIC OVARIAN CYST BX  2005   AND  URETEROSCOPIC LASER LITHO  STONE EXTRACTION  . SHOULDER OPEN ROTATOR CUFF REPAIR Right 2004  . TONSILLECTOMY    . TRANSTHORACIC ECHOCARDIOGRAM  06-07-2006   normal study/  ef 60-65%  . TUBAL LIGATION Bilateral 1995   Past Medical History:  Diagnosis Date  . Anxiety   . Anxiety disorder   . Arthritis   . Blepharitis   . Chronic low back pain   . Depression   . Dyslipidemia   . Fibromyalgia   . GERD (gastroesophageal reflux disease)   . Headache    hx migraines  . History of kidney stones   . History of panic attacks   . History of renal calculi   . IBS (irritable bowel syndrome)   . Interstitial cystitis   . Lupus ([place])    tested positive for antibodies for lupus. dr to do further tests  . Menorrhagia   . OSA on CPAP    not used cpap for several weeks  . Pneumonia    hx  . PONV (postoperative nausea and vomiting)   . RLS (restless legs syndrome)   . Rosacea   . Seasonal asthma   . SI (sacroiliac) joint dysfunction   . SUI (stress urinary incontinence, female)   . UTI (lower urinary tract infection)    hx  . 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 02/23/2013   BP (!) 134/88   Pulse 67   Temp 98.1 F (36.7 C)   Ht 5\' 3"  (1.6 m)   Wt (!) 231 lb 12.8 oz (105.1 kg)   SpO2 96%   BMI 41.06 kg/m   Opioid Risk Score:   Fall Risk Score:  `1  Depression screen  PHQ 2/9  Depression screen [facility] 2/9 03/21/2020 03/17/2020 08/18/2019 06/21/2019 08/12/2018 07/20/2018 06/19/2018  Decreased Interest 0 2 2 0 0 1 1  Down, Depressed, Hopeless 0 2 2 0 0 1 1  PHQ - 2 Score 0 4 4 0 0 2 2  Altered sleeping 0 - - - - - -  Tired, decreased energy 0 - - - - - -  Change in appetite 0 - - - - - -  Feeling bad or failure about yourself  0 - - - - - -  Trouble concentrating 0 - - - - - -  Moving slowly or fidgety/restless 0 - - - - - -  Suicidal thoughts 0 - - - - - -  PHQ-9 Score 0 - - - - - -  Difficult doing work/chores - - - - - - -  Some recent data might be hidden   Review of Systems  Constitutional: Negative.   HENT: Negative.   Eyes: Negative.   Respiratory: Negative.   Cardiovascular: Negative.   Gastrointestinal: Negative.   Endocrine: Negative.   Genitourinary: Positive for frequency, pelvic pain and urgency.       IC  Musculoskeletal: Positive for back pain and gait problem.       HIP PAIN  Skin: Negative.   Allergic/Immunologic: Negative.   Neurological: Positive for weakness and numbness.       TINGLING  Psychiatric/Behavioral:       ANXIETY  All other systems reviewed and are negative.      Objective:   Physical Exam Vitals and nursing note reviewed.  Constitutional:      Appearance: Normal appearance.  Neck:     Comments: Cervical Paraspinal Tenderness: C-3-C-6 Cardiovascular:     Rate and Rhythm: Normal rate and regular rhythm.     Pulses: Normal pulses.     Heart sounds: Normal heart sounds.  Pulmonary:     Effort: Pulmonary effort is normal.     Breath sounds: Normal breath sounds.  Musculoskeletal:     Cervical back: Normal range of motion and neck supple.     Comments: Normal Muscle Bulk and Muscle Testing Reveals:  Upper Extremities: Full ROM and Muscle Strength 5/5 Right AC Joint Tenderness Lumbar Paraspinal Tenderness: L-3-L-5 Right Greater Trochanter Tenderness Lower Extremities: Full ROM and Muscle Strength 5/5  Arises from chair slowly Narrow Based Gait   Skin:    General: Skin is warm and dry.  Neurological:     Mental Status: She is alert and oriented to person, place, and time.  Psychiatric:        Mood and Affect: Mood normal.        Behavior: Behavior normal.           Assessment & Plan:  1. Lumbago/ Lumbar Spondylosis/LeftLumbar Radiculitis: Continue to Monitor.04/14/2020. Refilled: HYDROcodone 10/325mg  one tablet every8hours as needed #90.  We will continue the opioid monitoring program, this consists of regular clinic visits, examinations, urine drug screen, pill counts as well as use of [place] Controlled Substance Reporting System. 2. Fibromyalgia. Continue Current exercise Regime.04/14/2020 3. Anxiety and depression:[facility]ry [facility]. Continue Counseling at [facility].04/14/2020 4. Migraines: On Maxalt. Neurology Following.04/14/2020 5. OSA :PCP Following. Continue to Monitor.04/14/2020 6. Obesity: Following Healthy Diet Regimenand Continue HEP as Tolerated.04/14/2020 7. Status Post Cervical Spinal Fusion: C5C6- C-6- C-7 Anterior Cervical Discectomy Fusion and Allograft Plate:Dr. Lorin Mercy Following.04/14/2020 8. Cervicalgia/ Cervical Radiculitis/ S/P Cervical Spinal Fusion: Ms. Doyel states she is awaiting on a surgery date, she will update the office when she has a surgical date. [place] Following.04/14/2020 9. Muscle Spasm: Continue Tizanidine as needed.04/14/2020 10.Hereditary and Idiopathic Peripheral Neuropathy Continue with Tens Unit.04/14/2020. 11.Right Greater Trochanteric Bursitis:Continue to Alternate Ice andheat Therapy. Continue to Monitor.04/14/2020. 12. Chronic Bilateral Knee Pain:No Complaints Today.Continue HEP as Tolerated. Continue to Monitor.04/14/2020 13. Lateral Epicondylitis of Right Elbow:No complaints today. Ortho Following. S/P Injection she  reports.04/14/2020.  21minutes of face to face  patient care time was spent during this visit. All questions were encouraged and answered.  F/U in 1 month

## 2020-04-17 MED FILL — HYDROCODON-APAP 10-325: 10-325 | 7 days supply | Qty: 21 | Fill #0

## 2020-04-19 ENCOUNTER — Other Ambulatory Visit: Payer: Self-pay

## 2020-04-19 ENCOUNTER — Ambulatory Visit: Payer: Medicare Other | Admitting: Surgery

## 2020-04-19 ENCOUNTER — Ambulatory Visit (INDEPENDENT_AMBULATORY_CARE_PROVIDER_SITE_OTHER): Payer: Medicare Other

## 2020-04-19 DIAGNOSIS — J301 Allergic rhinitis due to pollen: Secondary | ICD-10-CM | POA: Diagnosis not present

## 2020-04-24 ENCOUNTER — Telehealth: Payer: Self-pay

## 2020-04-24 DIAGNOSIS — F3181 Bipolar II disorder: Secondary | ICD-10-CM | POA: Diagnosis not present

## 2020-04-24 DIAGNOSIS — F332 Major depressive disorder, recurrent severe without psychotic features: Secondary | ICD-10-CM | POA: Diagnosis not present

## 2020-04-24 DIAGNOSIS — M797 Fibromyalgia: Secondary | ICD-10-CM

## 2020-04-24 DIAGNOSIS — F411 Generalized anxiety disorder: Secondary | ICD-10-CM | POA: Diagnosis not present

## 2020-04-24 MED ORDER — HYDROCODONE-ACETAMINOPHEN 10-325 MG PO TABS: ORAL_TABLET | ORAL | 0 refills | Status: DC

## 2020-04-24 MED FILL — PRAMIPEXOLE 0.25 MG TABLET: 0.25 | 30 days supply | Qty: 55 | Fill #0

## 2020-04-24 MED FILL — HYDROCODON-APAP 10-325: 10-325 | 23 days supply | Qty: 69 | Fill #0

## 2020-04-24 NOTE — Telephone Encounter (Signed)
[name],  Was only able to pick up a partial prescription due to lack of insurance. Hydrocodone prescription e-scribed today for the remainder of original prescription. [name] is aware of the above, Lysle Morales CMA spoke with her.

## 2020-04-24 NOTE — Telephone Encounter (Signed)
Patient called stating she had to re-enroll for her prescription insurance and it didn't kick in until Aug 1 so she only got #21 of Norco from last prescription. Need to rest of prescription called in.  [place] pharmacy.

## 2020-04-25 DIAGNOSIS — F3181 Bipolar II disorder: Secondary | ICD-10-CM | POA: Diagnosis not present

## 2020-04-25 DIAGNOSIS — F332 Major depressive disorder, recurrent severe without psychotic features: Secondary | ICD-10-CM | POA: Diagnosis not present

## 2020-04-25 DIAGNOSIS — F411 Generalized anxiety disorder: Secondary | ICD-10-CM | POA: Diagnosis not present

## 2020-04-27 DIAGNOSIS — M5481 Occipital neuralgia: Secondary | ICD-10-CM | POA: Diagnosis not present

## 2020-04-27 DIAGNOSIS — M810 Age-related osteoporosis without current pathological fracture: Secondary | ICD-10-CM | POA: Diagnosis not present

## 2020-04-27 DIAGNOSIS — G43009 Migraine without aura, not intractable, without status migrainosus: Secondary | ICD-10-CM | POA: Diagnosis not present

## 2020-04-27 DIAGNOSIS — N951 Menopausal and female climacteric states: Secondary | ICD-10-CM | POA: Diagnosis not present

## 2020-04-27 DIAGNOSIS — K219 Gastro-esophageal reflux disease without esophagitis: Secondary | ICD-10-CM | POA: Diagnosis not present

## 2020-04-27 DIAGNOSIS — R7303 Prediabetes: Secondary | ICD-10-CM | POA: Diagnosis not present

## 2020-04-27 DIAGNOSIS — E7800 Pure hypercholesterolemia, unspecified: Secondary | ICD-10-CM | POA: Diagnosis not present

## 2020-04-27 DIAGNOSIS — Z6839 Body mass index (BMI) 39.0-39.9, adult: Secondary | ICD-10-CM | POA: Diagnosis not present

## 2020-05-03 ENCOUNTER — Ambulatory Visit (INDEPENDENT_AMBULATORY_CARE_PROVIDER_SITE_OTHER): Payer: Medicare Other

## 2020-05-03 DIAGNOSIS — J301 Allergic rhinitis due to pollen: Secondary | ICD-10-CM

## 2020-05-05 DIAGNOSIS — Z01419 Encounter for gynecological examination (general) (routine) without abnormal findings: Secondary | ICD-10-CM | POA: Diagnosis not present

## 2020-05-05 DIAGNOSIS — N898 Other specified noninflammatory disorders of vagina: Secondary | ICD-10-CM | POA: Diagnosis not present

## 2020-05-16 ENCOUNTER — Encounter: Payer: Self-pay | Admitting: Registered Nurse

## 2020-05-16 ENCOUNTER — Encounter: Payer: Medicare Other | Attending: Physical Medicine & Rehabilitation | Admitting: Registered Nurse

## 2020-05-16 ENCOUNTER — Other Ambulatory Visit: Payer: Self-pay

## 2020-05-16 VITALS — BP 133/88 | HR 69 | Temp 97.8°F | Ht 63.0 in | Wt 233.0 lb

## 2020-05-16 DIAGNOSIS — M797 Fibromyalgia: Secondary | ICD-10-CM | POA: Insufficient documentation

## 2020-05-16 DIAGNOSIS — M7062 Trochanteric bursitis, left hip: Secondary | ICD-10-CM

## 2020-05-16 DIAGNOSIS — Z5181 Encounter for therapeutic drug level monitoring: Secondary | ICD-10-CM | POA: Diagnosis not present

## 2020-05-16 DIAGNOSIS — M5416 Radiculopathy, lumbar region: Secondary | ICD-10-CM | POA: Diagnosis not present

## 2020-05-16 DIAGNOSIS — N390 Urinary tract infection, site not specified: Secondary | ICD-10-CM | POA: Insufficient documentation

## 2020-05-16 DIAGNOSIS — Z981 Arthrodesis status: Secondary | ICD-10-CM

## 2020-05-16 DIAGNOSIS — G894 Chronic pain syndrome: Secondary | ICD-10-CM | POA: Diagnosis not present

## 2020-05-16 DIAGNOSIS — R202 Paresthesia of skin: Secondary | ICD-10-CM | POA: Insufficient documentation

## 2020-05-16 DIAGNOSIS — R200 Anesthesia of skin: Secondary | ICD-10-CM | POA: Insufficient documentation

## 2020-05-16 DIAGNOSIS — M5412 Radiculopathy, cervical region: Secondary | ICD-10-CM | POA: Diagnosis not present

## 2020-05-16 DIAGNOSIS — Z79899 Other long term (current) drug therapy: Secondary | ICD-10-CM | POA: Insufficient documentation

## 2020-05-16 DIAGNOSIS — M542 Cervicalgia: Secondary | ICD-10-CM

## 2020-05-16 MED ORDER — HYDROCODONE-ACETAMINOPHEN 10-325 MG PO TABS: ORAL_TABLET | ORAL | 0 refills | Status: DC

## 2020-05-16 MED FILL — HYDROCODON-APAP 10-325: 10-325 | 30 days supply | Qty: 90 | Fill #0

## 2020-05-16 NOTE — Progress Notes (Addendum)
Subjective:    Patient ID: Karina Greer, female    DOB: 28-Dec-1965, 54 y.o.   MRN: [id number]  HPI: Karina Greer is a 54 y.o. female who returns for follow up appointment for chronic pain and medication refill. She states her pain is located in her neck and lower back pain radiating into her left hip.  She rates her pain 6. Her current exercise regime is walking and performing stretching exercises.  Ms. [place] Morphine equivalent is 30.00 MME.    Last Oral Swab was Performed on 12/14/2019, it was consistent.    Pain Inventory Average Pain 7 Pain Right Now 6 My pain is constant, sharp, stabbing, tingling and aching  In the last 24 hours, has pain interfered with the following? General activity 6 Relation with others 6 Enjoyment of life 6 What TIME of day is your pain at its worst? morning , daytime, evening and night Sleep (in general) Fair  Pain is worse with: walking, bending, sitting, inactivity, standing and some activites Pain improves with: rest, heat/ice, therapy/exercise, pacing activities, medication and injections Relief from Meds: 7  Family History  Problem Relation Age of Onset  . Hypertension Mother   . Diabetes Father   . Brain cancer Father   . Fibromyalgia Sister   . Suicidality Sister   . Hypertension Other        Cancer, Cerebrovascular disease run on mother side of family   Social History   Socioeconomic History  . Marital status: Divorced    Spouse name: Not on file  . Number of children: 3  . Years of education: HS  . Highest education level: 12th grade  Occupational History  . Occupation: Merchandiser, retail: UNEMPLOYED    Comment: Disability  Tobacco Use  . Smoking status: Never Smoker  . Smokeless tobacco: Never Used  Vaping Use  . Vaping Use: Never used  Substance and Sexual Activity  . Alcohol use: No  . Drug use: No  . Sexual activity: Not on file  Other Topics Concern  . Not on file  Social History Narrative   Patient  is right-handed. She avoids caffeine. She has recently been using the treadmill.   Social Determinants of Health   Financial Resource Strain:   . Difficulty of Paying Living Expenses: Not on file  Food Insecurity:   . Worried About Charity fundraiser in the Last Year: Not on file  . Ran Out of Food in the Last Year: Not on file  Transportation Needs:   . Lack of Transportation (Medical): Not on file  . Lack of Transportation (Non-Medical): Not on file  Physical Activity:   . Days of Exercise per Week: Not on file  . Minutes of Exercise per Session: Not on file  Stress:   . Feeling of Stress : Not on file  Social Connections:   . Frequency of Communication with Friends and Family: Not on file  . Frequency of Social Gatherings with Friends and Family: Not on file  . Attends Religious Services: Not on file  . Active Member of Clubs or Organizations: Not on file  . Attends Archivist Meetings: Not on file  . Marital Status: Not on file   Past Surgical History:  Procedure Laterality Date  . ACHILLES TENDON SURGERY Right 3/16  . ANTERIOR CERVICAL DECOMP/DISCECTOMY FUSION N/A 03/11/2016   Procedure: C5-6, C6-7 Anterior Cervical Discectomy and Fusion, Allograft, Plate;  Surgeon: Marybelle Killings, MD;  Location: [facility]  OR;  Service: Orthopedics;  Laterality: N/A;  . CHOLECYSTECTOMY  2014  . COLONOSCOPY WITH PROPOFOL N/A 08/25/2017   Procedure: COLONOSCOPY WITH PROPOFOL;  Surgeon: Manya Silvas, MD;  Location: [facility] ENDOSCOPY;  Service: Endoscopy;  Laterality: N/A;  . CYSTO WITH HYDRODISTENSION N/A 01/26/2014   Procedure: CYSTOSCOPY/HYDRODISTENSION;  Surgeon: Bernestine Amass, MD;  Location: [facility];  Service: Urology;  Laterality: N/A;  . CYSTO/  URETHRAL DILATION/  HYDRODISTENTION/   INSTILLATION THERAPY  07-16-2010//   12-30-2007//   10-27-2006  . EXERCISE TOLERENCE TEST  10-12-2010   NEGATIVE  ADEQUATE ETT/  NO ISCHEMIA OR EVIDENCE HIGH GRADE OBSTRUCTIVE CAD/  NO  FURTHER TEST NEEDED  . [place] ENDOMETRIAL ABLATION  2014  . LAPAROSCOPIC OVARIAN CYST BX  2005   AND  URETEROSCOPIC LASER LITHO  STONE EXTRACTION  . SHOULDER OPEN ROTATOR CUFF REPAIR Right 2004  . TONSILLECTOMY    . TRANSTHORACIC ECHOCARDIOGRAM  06-07-2006   normal study/  ef 60-65%  . TUBAL LIGATION Bilateral 1995   Past Surgical History:  Procedure Laterality Date  . ACHILLES TENDON SURGERY Right 3/16  . ANTERIOR CERVICAL DECOMP/DISCECTOMY FUSION N/A 03/11/2016   Procedure: C5-6, C6-7 Anterior Cervical Discectomy and Fusion, Allograft, Plate;  Surgeon: Marybelle Killings, MD;  Location: [place];  Service: Orthopedics;  Laterality: N/A;  . CHOLECYSTECTOMY  2014  . COLONOSCOPY WITH PROPOFOL N/A 08/25/2017   Procedure: COLONOSCOPY WITH PROPOFOL;  Surgeon: Manya Silvas, MD;  Location: [facility] ENDOSCOPY;  Service: Endoscopy;  Laterality: N/A;  . CYSTO WITH HYDRODISTENSION N/A 01/26/2014   Procedure: CYSTOSCOPY/HYDRODISTENSION;  Surgeon: Bernestine Amass, MD;  Location: [facility];  Service: Urology;  Laterality: N/A;  . CYSTO/  URETHRAL DILATION/  HYDRODISTENTION/   INSTILLATION THERAPY  07-16-2010//   12-30-2007//   10-27-2006  . EXERCISE TOLERENCE TEST  10-12-2010   NEGATIVE  ADEQUATE ETT/  NO ISCHEMIA OR EVIDENCE HIGH GRADE OBSTRUCTIVE CAD/  NO FURTHER TEST NEEDED  . [place] ENDOMETRIAL ABLATION  2014  . LAPAROSCOPIC OVARIAN CYST BX  2005   AND  URETEROSCOPIC LASER LITHO  STONE EXTRACTION  . SHOULDER OPEN ROTATOR CUFF REPAIR Right 2004  . TONSILLECTOMY    . TRANSTHORACIC ECHOCARDIOGRAM  06-07-2006   normal study/  ef 60-65%  . TUBAL LIGATION Bilateral 1995   Past Medical History:  Diagnosis Date  . Anxiety   . Anxiety disorder   . Arthritis   . Blepharitis   . Chronic low back pain   . Depression   . Dyslipidemia   . Fibromyalgia   . GERD (gastroesophageal reflux disease)   . Headache    hx migraines  . History of kidney stones     . History of panic attacks   . History of renal calculi   . IBS (irritable bowel syndrome)   . Interstitial cystitis   . Lupus ([place])    tested positive for antibodies for lupus. dr to do further tests  . Menorrhagia   . OSA on CPAP    not used cpap for several weeks  . Pneumonia    hx  . PONV (postoperative nausea and vomiting)   . RLS (restless legs syndrome)   . Rosacea   . Seasonal asthma   . SI (sacroiliac) joint dysfunction   . SUI (stress urinary incontinence, female)   . UTI (lower urinary tract infection)    hx  . 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 02/23/2013  Temp 97.8 F (36.6 C)   Ht 5\' 3"  (1.6 m)   Wt 233 lb (105.7 kg)   BMI 41.27 kg/m   Opioid Risk Score:   Fall Risk Score:  `1  Depression screen PHQ 2/9  Depression screen [facility] 2/9 04/14/2020 03/21/2020 03/17/2020 08/18/2019 06/21/2019 08/12/2018 07/20/2018  Decreased Interest 0 0 2 2 0 0 1  Down, Depressed, Hopeless 0 0 2 2 0 0 1  PHQ - 2 Score 0 0 4 4 0 0 2  Altered sleeping - 0 - - - - -  Tired, decreased energy - 0 - - - - -  Change in appetite - 0 - - - - -  Feeling bad or failure about yourself  - 0 - - - - -  Trouble concentrating - 0 - - - - -  Moving slowly or fidgety/restless - 0 - - - - -  Suicidal thoughts - 0 - - - - -  PHQ-9 Score - 0 - - - - -  Difficult doing work/chores - - - - - - -  Some recent data might be hidden   Review of Systems  Constitutional: Negative.   HENT: Negative.   Eyes: Negative.   Respiratory: Negative.   Cardiovascular: Negative.   Gastrointestinal: Negative.   Endocrine: Negative.   Genitourinary: Negative.   Musculoskeletal: Positive for back pain and neck pain.  Neurological: Positive for numbness.       Tingling  Hematological: Negative.   Psychiatric/Behavioral: Negative.   All other systems reviewed and are negative.      Objective:   Physical Exam Vitals and nursing note reviewed.  Constitutional:      Appearance: Normal appearance.   Cardiovascular:     Rate and Rhythm: Normal rate and regular rhythm.     Pulses: Normal pulses.     Heart sounds: Normal heart sounds.  Pulmonary:     Effort: Pulmonary effort is normal.     Breath sounds: Normal breath sounds.  Musculoskeletal:     Cervical back: Normal range of motion and neck supple.     Comments: Normal Muscle Bulk and Muscle Testing Reveals:  Upper Extremities: Full ROM and Muscle Strength 5/5 Lumbar Paraspinal Tenderness: L-3-L-5 Left Greater Trochanter Tenderness Lower Extremities: Full ROM and Muscle Strength 5/5 Arises from chair with ease Narrow Based Gait   Skin:    General: Skin is warm and dry.     Coloration: Skin is not jaundiced.  Neurological:     Mental Status: She is alert.           Assessment & Plan:  1. Lumbago/ Lumbar Spondylosis/LeftLumbar Radiculitis: Continue to Monitor.05/16/2020. Refilled: HYDROcodone 10/325mg  one tablet every8hours as needed #90.  We will continue the opioid monitoring program, this consists of regular clinic visits, examinations, urine drug screen, pill counts as well as use of [place] Controlled Substance Reporting system. A 12 month History has been reviewed on the [place] on 05/16/2020. 2. Fibromyalgia. Continue Current exercise Regime.05/16/2020 3. Anxiety and depression:[facility]iatry [facility]. Continue Counseling at [facility].05/16/2020 4. Migraines: On Maxalt. Neurology Following.05/16/2020 5. OSA :PCP Following. Continue to Monitor.05/16/2020 6. Obesity: Following Healthy Diet Regimenand Continue HEP as Tolerated.05/16/2020 7. Status Post Cervical Spinal Fusion: C5C6- C-6- C-7 Anterior Cervical Discectomy Fusion and Allograft Plate:Dr. Lorin Mercy Following.05/16/2020 8. Cervicalgia/ Cervical Radiculitis/ S/P Cervical Spinal Fusion: Ms. Doria states she is awaiting on a surgery date, she will update  the office when  she has a surgical date. Continueto MonitorDr. Lorin Mercy Following.05/16/2020 9. Muscle Spasm: Continue Tizanidine as needed.05/16/2020 10.Hereditary and Idiopathic Peripheral Neuropathy Continue with Tens Unit.05/16/2020. 11.Left Greater Trochanteric Bursitis:Continue to Alternate Ice andheat Therapy. Continue to Monitor.05/16/2020. 12. Chronic Bilateral Knee Pain:No Complaints Today.Continue HEP as Tolerated. Continue to Monitor.05/16/2020 13. Lateral Epicondylitis of Right Elbow:No complaints today. Ortho Following.05/16/2020.  76minutes of face to face patient care time was spent during this visit. All questions were encouraged and answered.  F/U in 1 month

## 2020-05-17 ENCOUNTER — Ambulatory Visit: Payer: Medicare Other

## 2020-05-24 ENCOUNTER — Other Ambulatory Visit: Payer: Self-pay

## 2020-05-24 ENCOUNTER — Ambulatory Visit (INDEPENDENT_AMBULATORY_CARE_PROVIDER_SITE_OTHER): Payer: Medicare Other

## 2020-05-24 DIAGNOSIS — J301 Allergic rhinitis due to pollen: Secondary | ICD-10-CM

## 2020-05-24 DIAGNOSIS — F3181 Bipolar II disorder: Secondary | ICD-10-CM | POA: Diagnosis not present

## 2020-05-24 DIAGNOSIS — F411 Generalized anxiety disorder: Secondary | ICD-10-CM | POA: Diagnosis not present

## 2020-05-24 DIAGNOSIS — F332 Major depressive disorder, recurrent severe without psychotic features: Secondary | ICD-10-CM | POA: Diagnosis not present

## 2020-05-24 DIAGNOSIS — Z79899 Other long term (current) drug therapy: Secondary | ICD-10-CM | POA: Diagnosis not present

## 2020-06-01 DIAGNOSIS — G521 Disorders of glossopharyngeal nerve: Secondary | ICD-10-CM | POA: Diagnosis not present

## 2020-06-01 DIAGNOSIS — Z923 Personal history of irradiation: Secondary | ICD-10-CM | POA: Diagnosis not present

## 2020-06-07 ENCOUNTER — Ambulatory Visit: Payer: Medicare Other

## 2020-06-07 ENCOUNTER — Ambulatory Visit: Payer: Medicare Other | Admitting: Orthopaedic Surgery

## 2020-06-07 DIAGNOSIS — H00029 Hordeolum internum unspecified eye, unspecified eyelid: Secondary | ICD-10-CM | POA: Diagnosis not present

## 2020-06-07 DIAGNOSIS — H1030 Unspecified acute conjunctivitis, unspecified eye: Secondary | ICD-10-CM | POA: Diagnosis not present

## 2020-06-12 ENCOUNTER — Encounter: Payer: Self-pay | Admitting: Registered Nurse

## 2020-06-12 ENCOUNTER — Other Ambulatory Visit: Payer: Self-pay

## 2020-06-12 ENCOUNTER — Encounter: Payer: Medicare Other | Attending: Physical Medicine & Rehabilitation | Admitting: Registered Nurse

## 2020-06-12 VITALS — Ht 63.0 in | Wt 230.0 lb

## 2020-06-12 DIAGNOSIS — Z5181 Encounter for therapeutic drug level monitoring: Secondary | ICD-10-CM | POA: Diagnosis not present

## 2020-06-12 DIAGNOSIS — M797 Fibromyalgia: Secondary | ICD-10-CM | POA: Diagnosis not present

## 2020-06-12 DIAGNOSIS — G894 Chronic pain syndrome: Secondary | ICD-10-CM

## 2020-06-12 DIAGNOSIS — Z79899 Other long term (current) drug therapy: Secondary | ICD-10-CM | POA: Diagnosis not present

## 2020-06-12 DIAGNOSIS — R202 Paresthesia of skin: Secondary | ICD-10-CM | POA: Insufficient documentation

## 2020-06-12 DIAGNOSIS — M47816 Spondylosis without myelopathy or radiculopathy, lumbar region: Secondary | ICD-10-CM

## 2020-06-12 DIAGNOSIS — M7062 Trochanteric bursitis, left hip: Secondary | ICD-10-CM

## 2020-06-12 DIAGNOSIS — M5412 Radiculopathy, cervical region: Secondary | ICD-10-CM | POA: Diagnosis not present

## 2020-06-12 DIAGNOSIS — M542 Cervicalgia: Secondary | ICD-10-CM

## 2020-06-12 DIAGNOSIS — Z981 Arthrodesis status: Secondary | ICD-10-CM | POA: Diagnosis not present

## 2020-06-12 DIAGNOSIS — N390 Urinary tract infection, site not specified: Secondary | ICD-10-CM | POA: Insufficient documentation

## 2020-06-12 DIAGNOSIS — R200 Anesthesia of skin: Secondary | ICD-10-CM | POA: Insufficient documentation

## 2020-06-12 DIAGNOSIS — M7061 Trochanteric bursitis, right hip: Secondary | ICD-10-CM | POA: Diagnosis not present

## 2020-06-12 MED ORDER — HYDROCODONE-ACETAMINOPHEN 10-325 MG PO TABS: ORAL_TABLET | ORAL | 0 refills | Status: DC

## 2020-06-12 NOTE — Progress Notes (Addendum)
Subjective:    Patient ID: Karina Greer, female    DOB: [birth year], 54 y.o.   MRN: [id number]  HPI: Karina Greer is a 54 y.o. female whose appointment was changed to a virtual visit- My-Chart Video. She reports her brother was diagnosed with COVID-19, and she is on quarantine at this time. Also states she is scheduled to have a COVID test on 06/13/2020, at her PCP office, she was encouraged to keep this provider updated, she verbalizes understanding. She states her pain is located in her neck radiating into her right shoulder, lower back pain, left lower extremity numbness at timed and bilateral hip pain L>R. She rates her pain 6. Her  current exercise regime is walking and performing stretching exercises.  Ms. Lep[place] Morphine equivalent is 30.00 MME.  Last UDS was Performed on 12/14/2019, it was consistent.   Pain Inventory Average Pain 10 Pain Right Now 6 My pain is stabbing and aching  In the last 24 hours, has pain interfered with the following? General activity 8 Relation with others 8 Enjoyment of life 8 What TIME of day is your pain at its worst? morning , daytime, evening and night Sleep (in general) Fair  Pain is worse with: walking, bending, sitting, inactivity, standing and some activites Pain improves with: rest, heat/ice, therapy/exercise, pacing activities, medication, TENS and injections Relief from Meds: 8  Family History  Problem Relation Age of Onset  . Hypertension Mother   . Diabetes Father   . Brain cancer Father   . Fibromyalgia Sister   . Suicidality Sister   . Hypertension Other        Cancer, Cerebrovascular disease run on mother side of family   Social History   Socioeconomic History  . Marital status: Divorced    Spouse name: Not on file  . Number of children: 3  . Years of education: HS  . Highest education level: 12th grade  Occupational History  . Occupation: Merchandiser, retail: UNEMPLOYED    Comment: Disability  Tobacco Use  .  Smoking status: Never Smoker  . Smokeless tobacco: Never Used  Vaping Use  . Vaping Use: Never used  Substance and Sexual Activity  . Alcohol use: No  . Drug use: No  . Sexual activity: Not on file  Other Topics Concern  . Not on file  Social History Narrative   Patient is right-handed. She avoids caffeine. She has recently been using the treadmill.   Social Determinants of Health   Financial Resource Strain:   . Difficulty of Paying Living Expenses: Not on file  Food Insecurity:   . Worried About Charity fundraiser in the Last Year: Not on file  . Ran Out of Food in the Last Year: Not on file  Transportation Needs:   . Lack of Transportation (Medical): Not on file  . Lack of Transportation (Non-Medical): Not on file  Physical Activity:   . Days of Exercise per Week: Not on file  . Minutes of Exercise per Session: Not on file  Stress:   . Feeling of Stress : Not on file  Social Connections:   . Frequency of Communication with Friends and Family: Not on file  . Frequency of Social Gatherings with Friends and Family: Not on file  . Attends Religious Services: Not on file  . Active Member of Clubs or Organizations: Not on file  . Attends Archivist Meetings: Not on file  . Marital Status: Not on file  Past Surgical History:  Procedure Laterality Date  . ACHILLES TENDON SURGERY Right 3/16  . ANTERIOR CERVICAL DECOMP/DISCECTOMY FUSION N/A 03/11/2016   Procedure: C5-6, C6-7 Anterior Cervical Discectomy and Fusion, Allograft, Plate;  Surgeon: Marybelle Killings, MD;  Location: [place];  Service: Orthopedics;  Laterality: N/A;  . CHOLECYSTECTOMY  2014  . COLONOSCOPY WITH PROPOFOL N/A 08/25/2017   Procedure: COLONOSCOPY WITH PROPOFOL;  Surgeon: Manya Silvas, MD;  Location: [facility] ENDOSCOPY;  Service: Endoscopy;  Laterality: N/A;  . CYSTO WITH HYDRODISTENSION N/A 01/26/2014   Procedure: CYSTOSCOPY/HYDRODISTENSION;  Surgeon: Bernestine Amass, MD;  [facility];  Service: Urology;  Laterality: N/A;  . CYSTO/  URETHRAL DILATION/  HYDRODISTENTION/   INSTILLATION THERAPY  07-16-2010//   12-30-2007//   10-27-2006  . EXERCISE TOLERENCE TEST  10-12-2010   NEGATIVE  ADEQUATE ETT/  NO ISCHEMIA OR EVIDENCE HIGH GRADE OBSTRUCTIVE CAD/  NO FURTHER TEST NEEDED  . [place] ENDOMETRIAL ABLATION  2014  . LAPAROSCOPIC OVARIAN CYST BX  2005   AND  URETEROSCOPIC LASER LITHO  STONE EXTRACTION  . SHOULDER OPEN ROTATOR CUFF REPAIR Right 2004  . TONSILLECTOMY    . TRANSTHORACIC ECHOCARDIOGRAM  06-07-2006   normal study/  ef 60-65%  . TUBAL LIGATION Bilateral 1995   Past Surgical History:  Procedure Laterality Date  . ACHILLES TENDON SURGERY Right 3/16  . ANTERIOR CERVICAL DECOMP/DISCECTOMY FUSION N/A 03/11/2016   Procedure: C5-6, C6-7 Anterior Cervical Discectomy and Fusion, Allograft, Plate;  Surgeon: Marybelle Killings, MD;  Location: [place];  Service: Orthopedics;  Laterality: N/A;  . CHOLECYSTECTOMY  2014  . COLONOSCOPY WITH PROPOFOL N/A 08/25/2017   Procedure: COLONOSCOPY WITH PROPOFOL;  Surgeon: Manya Silvas, MD;  Location: [facility] ENDOSCOPY;  Service: Endoscopy;  Laterality: N/A;  . CYSTO WITH HYDRODISTENSION N/A 01/26/2014   Procedure: CYSTOSCOPY/HYDRODISTENSION;  Surgeon: Bernestine Amass, MD;  Location: [facility];  Service: Urology;  Laterality: N/A;  . CYSTO/  URETHRAL DILATION/  HYDRODISTENTION/   INSTILLATION THERAPY  07-16-2010//   12-30-2007//   10-27-2006  . EXERCISE TOLERENCE TEST  10-12-2010   NEGATIVE  ADEQUATE ETT/  NO ISCHEMIA OR EVIDENCE HIGH GRADE OBSTRUCTIVE CAD/  NO FURTHER TEST NEEDED  . [place] ENDOMETRIAL ABLATION  2014  . LAPAROSCOPIC OVARIAN CYST BX  2005   AND  URETEROSCOPIC LASER LITHO  STONE EXTRACTION  . SHOULDER OPEN ROTATOR CUFF REPAIR Right 2004  . TONSILLECTOMY    . TRANSTHORACIC ECHOCARDIOGRAM  06-07-2006   normal study/  ef 60-65%  . TUBAL LIGATION Bilateral 1995   Past  Medical History:  Diagnosis Date  . Anxiety   . Anxiety disorder   . Arthritis   . Blepharitis   . Chronic low back pain   . Depression   . Dyslipidemia   . Fibromyalgia   . GERD (gastroesophageal reflux disease)   . Headache    hx migraines  . History of kidney stones   . History of panic attacks   . History of renal calculi   . IBS (irritable bowel syndrome)   . Interstitial cystitis   . Lupus ([place])    tested positive for antibodies for lupus. dr to do further tests  . Menorrhagia   . OSA on CPAP    not used cpap for several weeks  . Pneumonia    hx  . PONV (postoperative nausea and vomiting)   . RLS (restless legs syndrome)   . Rosacea   .  Seasonal asthma   . SI (sacroiliac) joint dysfunction   . SUI (stress urinary incontinence, female)   . UTI (lower urinary tract infection)    hx  . 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 02/23/2013   Ht 5\' 3"  (1.6 m) Comment: pt reported, virtual visit  Wt 230 lb (104.3 kg) Comment: pt reported, virtual visit  BMI 40.74 kg/m   Opioid Risk Score:   Fall Risk Score:  `1  Depression screen PHQ 2/9  Depression screen [facility] 2/9 04/14/2020 03/21/2020 03/17/2020 08/18/2019 06/21/2019 08/12/2018 07/20/2018  Decreased Interest 0 0 2 2 0 0 1  Down, Depressed, Hopeless 0 0 2 2 0 0 1  PHQ - 2 Score 0 0 4 4 0 0 2  Altered sleeping - 0 - - - - -  Tired, decreased energy - 0 - - - - -  Change in appetite - 0 - - - - -  Feeling bad or failure about yourself  - 0 - - - - -  Trouble concentrating - 0 - - - - -  Moving slowly or fidgety/restless - 0 - - - - -  Suicidal thoughts - 0 - - - - -  PHQ-9 Score - 0 - - - - -  Difficult doing work/chores - - - - - - -  Some recent data might be hidden    Review of Systems  HENT: Positive for sore throat.   Respiratory: Positive for cough and chest tightness.   Musculoskeletal: Positive for back pain and neck pain.       Hip pain  Neurological: Positive for dizziness.       Tingling    Psychiatric/Behavioral: The patient is nervous/anxious.   All other systems reviewed and are negative.      Objective:   Physical Exam Vitals and nursing note reviewed.  Musculoskeletal:     Comments: No Physical Exam Performed: My-Chart Video Visit           Assessment & Plan:  1. Lumbago/ Lumbar Spondylosis/LeftLumbar Radiculitis: Continue to Monitor.06/12/2020. Refilled: HYDROcodone 10/325mg  one tablet every8hours as needed #90.  We will continue the opioid monitoring program, this consists of regular clinic visits, examinations, urine drug screen, pill counts as well as use of [place] Controlled Substance Reporting system. A 12 month History has been reviewed on the [place] on 06/12/2020. 2. Fibromyalgia. Continue Current exercise Regime.06/12/2020 3. Anxiety and depression:Psychiatry Following:[facility]. Continue Counseling at [facility].06/12/2020 4. Migraines: On Max[place]. Neurology Following.06/12/2020 5. OSA :PCP Following. Continue to Monitor.06/12/2020 6. Obesity: Following Healthy Diet Regimenand Continue HEP as Tolerated.06/12/2020 7. Status Post Cervical Spinal Fusion: C5C6- C-6- C-7 Anterior Cervical Discectomy Fusion and Allograft Plate:Dr. Lorin Mercy Following.06/12/2020 8. Cervicalgia/ Cervical Radiculitis/ S/P Cervical Spinal Fusion:Ms. Peregoy states she is awaiting on a surgery date, she will update the office when she has a surgical date.Continueto MonitorDr. Lorin Mercy Following.06/12/2020 9. Muscle Spasm: Continue Tizanidine as needed.06/12/2020 10.Hereditary and Idiopathic Peripheral Neuropathy Continue with Tens Unit.06/12/2020. 11.Bilateral Greater Trochanteric Bursitis:Continue to Alternate Ice andheat Therapy. Continue to Monitor.06/12/2020. 12. Chronic Bilateral Knee Pain:No Complaints Today.Continue HEP as Tolerated. Continue to  Monitor.06/12/2020 13. Lateral Epicondylitis of Right Elbow:No complaints today.Ortho Following.05/16/2020.  My-Chart Video Visit: Virtual Visit Established Patient Location of Patient: In her Home Location of Provider: In the Office    F/U in 1 month

## 2020-06-13 ENCOUNTER — Ambulatory Visit: Payer: Medicare Other | Admitting: Orthopaedic Surgery

## 2020-06-13 DIAGNOSIS — Z20822 Contact with and (suspected) exposure to covid-19: Secondary | ICD-10-CM | POA: Diagnosis not present

## 2020-06-13 DIAGNOSIS — J029 Acute pharyngitis, unspecified: Secondary | ICD-10-CM | POA: Diagnosis not present

## 2020-06-14 ENCOUNTER — Ambulatory Visit: Payer: Medicare Other

## 2020-06-15 NOTE — Addendum Note (Signed)
Addended by: Bayard Hugger on: 06/15/2020 10:10 AM   Modules accepted: Level of Service

## 2020-06-16 DIAGNOSIS — R109 Unspecified abdominal pain: Secondary | ICD-10-CM | POA: Diagnosis not present

## 2020-06-16 DIAGNOSIS — Z6841 Body Mass Index (BMI) 40.0 and over, adult: Secondary | ICD-10-CM | POA: Diagnosis not present

## 2020-06-16 MED FILL — HYDROCODON-APAP 10-325: 10-325 | 30 days supply | Qty: 90 | Fill #0

## 2020-06-20 ENCOUNTER — Ambulatory Visit (INDEPENDENT_AMBULATORY_CARE_PROVIDER_SITE_OTHER): Payer: Medicare Other | Admitting: Orthopaedic Surgery

## 2020-06-20 ENCOUNTER — Encounter: Payer: Self-pay | Admitting: Orthopaedic Surgery

## 2020-06-20 VITALS — BP 130/85 | HR 75

## 2020-06-20 DIAGNOSIS — M7711 Lateral epicondylitis, right elbow: Secondary | ICD-10-CM | POA: Diagnosis not present

## 2020-06-20 NOTE — Progress Notes (Signed)
Office Visit Note   Patient: Karina Greer           Date of Birth: [birth year]           MRN: [id number] Visit Date: 06/20/2020              Requested by: Cyndi Bender, PA-C [address] [place],  [place] [address] PCP: Cyndi Bender, PA-C   Assessment & Plan: Visit Diagnoses:  1. Lateral epicondylitis, right elbow     Plan: Patient's had greater than year symptom lateral epicondylitis on the right elbow.  She is failed injections x3 use of tennis elbow band anti-inflammatories, topical creams.  Would recommend proceeding with lateral epicondylar release drilling and repair.  Procedure discussed this would be done as an outpatient.  We discussed some increased difficulty handling postop pain with the amount of narcotics she is already taking.  Questions were elicited and answered operative procedure discussed in detail.  Follow-Up Instructions: No follow-ups on file.   Orders:  No orders of the defined types were placed in this encounter.  No orders of the defined types were placed in this encounter.     Procedures: No procedures performed   Clinical Data: No additional findings.   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  She has had previous lateral epicondylar injections the last one was not effective.  First injection gave her a few months of relief.  She has pain with gripping squeezing drops objects has to carry larger things by wrapping her arms around it instead of using her hand.  She is right-hand dominant.  Pain radiates from the lateral epicondyle down toward the wrist.  No problems with her fingers.  Previous two-level cervical fusion C5-6 C6-7 done 03/11/2016.  A follow-up cervical MRI scan soft tissue with and without contrast was performed which showed some prominent lymphoid tissue at the base of the tongue unchanged from previous MRI.   She had some disc osteophyte complex at C4-5 but this was on the left side opposite from her tennis elbow symptoms on the right.  She denies gait disturbance no fever or chills.  She has started on pain management prescribed elsewhere and is getting hydrocodone 10/325 90 tablets monthly.  She states the elbow still hurts severely and she wants to proceed with operative intervention.  Review of Systems all other systems are negative is obtained HPI.   Objective: Vital Signs: BP 130/85    Pulse 75   Physical Exam Constitutional:      Appearance: She is well-developed.  HENT:     Head: Normocephalic.     Right Ear: External ear normal.     Left Ear: External ear normal.  Eyes:     Pupils: Pupils are equal, round, and reactive to light.  Neck:     Thyroid: No thyromegaly.     Trachea: No tracheal deviation.  Cardiovascular:     Rate and Rhythm: Normal rate.  Pulmonary:     Effort: Pulmonary effort is normal.  Abdominal:     Palpations: Abdomen is soft.  Skin:    General: Skin is warm and dry.  Neurological:     Mental Status: She is alert and oriented to person, place, and time.  Psychiatric:        Behavior: Behavior normal.     Ortho Exam patient has exquisite tenderness over  lateral epicondyle reproduces her symptoms negative [place].  Pain with resisted wrist extension on the right negative on the left.  Reflexes are intact negative [place]. Specialty Comments:  No specialty comments available.  Imaging: No results found.   PMFS History: Patient Active Problem List   Diagnosis Date Noted   Lateral epicondylitis, right elbow 05/25/2019   Atherosclerosis of aorta (HCC) 10/28/2018   Elevated glucose 10/28/2018   Mixed hyperlipidemia 10/28/2018   Hypothyroidism 07/20/2018   LLQ pain 12/03/2017   Pre-diabetes 06/03/2017   Rectal bleeding 06/02/2017   S/P cervical spinal fusion 03/11/2016   Bilateral hand pain 11/29/2015   Elevated C-reactive protein  11/29/2015   Elevated rheumatoid factor 11/29/2015   Raynaud's phenomenon without gangrene 11/29/2015   Bloating 09/12/2014   Dizziness 08/08/2014   Peri-menopause 08/08/2014   Chronic tension-type headache, intractable 07/13/2014   Gastroesophageal reflux disease without esophagitis 07/12/2014   Anxiety 04/18/2014   Chronic arthritis 04/18/2014   Cystitis 04/18/2014   Morbid obesity ([place]) 04/18/2014   Fibromyalgia 04/18/2014   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[place]) 04/18/2014   Osteoporosis, 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   Arthropathy 04/18/2014   Cephalalgia 04/04/2014   Numbness and tingling 04/04/2014   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 02/23/2013   Nonspecific abnormal findings on radiological and other examination of skull and head 02/23/2013   Other nonspecific abnormal result of function study of brain and central nervous system 07/01/2012   Disturbance of skin sensation 07/01/2012   Migraine without aura 07/01/2012   Abdominal pain 02/11/2012   Chronic fatigue syndrome 02/11/2012   Chronic interstitial cystitis 02/11/2012   Dyspareunia 02/11/2012   Dysuria 02/11/2012   Female stress incontinence 02/11/2012   Irritable bowel syndrome with diarrhea 02/11/2012   Nausea and vomiting 02/11/2012   Nocturia 02/11/2012   Urge incontinence 02/11/2012   Urinary urgency 02/11/2012   Lumbar spondylosis 02/04/2012   Myalgia and myositis 02/04/2012   Depression 02/04/2012   Lumbosacral spondylosis without myelopathy 02/04/2012   OBESITY, UNSPECIFIED 10/03/2010   GENERALIZED ANXIETY DISORDER 10/03/2010   Chest pain 10/03/2010   Past Medical History:  Diagnosis Date   Anxiety    Anxiety disorder    Arthritis    Blepharitis    Chronic low back pain    Depression    Dyslipidemia    Fibromyalgia    GERD (gastroesophageal reflux disease)    Headache    hx migraines   History of kidney stones    History of panic  attacks    History of renal calculi    IBS (irritable bowel syndrome)    Interstitial cystitis    Lupus ([place])    tested positive for antibodies for lupus. 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    Family History  Problem Relation Age of Onset   Hypertension Mother    Diabetes Father    Brain cancer Father    Fibromyalgia Sister    Suicidality Sister    Hypertension Other        Cancer, Cerebrovascular disease  run on mother side of family    Past Surgical History:  Procedure Laterality Date   ACHILLES TENDON SURGERY Right 3/16   ANTERIOR CERVICAL DECOMP/DISCECTOMY FUSION N/A 03/11/2016   Procedure: C5-6, C6-7 Anterior Cervical Discectomy and Fusion, Allograft, Plate;  Surgeon: Marybelle Killings, MD;  Location: [place];  Service: Orthopedics;  Laterality: N/A;   CHOLECYSTECTOMY  2014   COLONOSCOPY WITH PROPOFOL N/A 08/25/2017   Procedure: COLONOSCOPY WITH PROPOFOL;  Surgeon: Manya Silvas, MD;  Location: [facility] ENDOSCOPY;  Service: Endoscopy;  Laterality: N/A;   CYSTO WITH HYDRODISTENSION N/A 01/26/2014   Procedure: CYSTOSCOPY/HYDRODISTENSION;  Surgeon: Bernestine Amass, MD;  Location: [facility];  Service: Urology;  Laterality: N/A;   CYSTO/  URETHRAL DILATION/  HYDRODISTENTION/   INSTILLATION THERAPY  07-16-2010//   12-30-2007//   10-27-2006   EXERCISE TOLERENCE TEST  10-12-2010   NEGATIVE  ADEQUATE ETT/  NO ISCHEMIA OR EVIDENCE HIGH GRADE OBSTRUCTIVE CAD/  NO FURTHER TEST NEEDED   HYSTEROSCOPY W/  [place] ENDOMETRIAL ABLATION  2014   LAPAROSCOPIC OVARIAN CYST BX  2005   AND  URETEROSCOPIC  LASER LITHO  STONE EXTRACTION   SHOULDER OPEN ROTATOR CUFF REPAIR Right 2004   TONSILLECTOMY     TRANSTHORACIC ECHOCARDIOGRAM  06-07-2006   normal study/  ef 60-65%   TUBAL LIGATION Bilateral 1995   Social History   Occupational History   Occupation: Merchandiser, retail: UNEMPLOYED    Comment: Disability  Tobacco Use   Smoking status: Never Smoker   Smokeless tobacco: Never Used  Scientific laboratory technician Use: Never used  Substance and Sexual Activity   Alcohol use: No   Drug use: No   Sexual activity: Not on file

## 2020-06-21 ENCOUNTER — Ambulatory Visit: Payer: Medicare Other

## 2020-06-26 ENCOUNTER — Telehealth: Payer: Self-pay

## 2020-06-26 ENCOUNTER — Other Ambulatory Visit: Payer: Self-pay | Admitting: Orthopaedic Surgery

## 2020-06-26 DIAGNOSIS — M7711 Lateral epicondylitis, right elbow: Secondary | ICD-10-CM | POA: Diagnosis not present

## 2020-06-26 MED ORDER — HYDROCODONE-ACETAMINOPHEN 10-325 MG PO TABS: 1.0000 | ORAL_TABLET | Freq: Four times a day (QID) | ORAL | 0 refills | Status: DC | PRN

## 2020-06-26 NOTE — Telephone Encounter (Signed)
Patient called stating she had her surgery and is home. She will be taking regular medication that Dr. Naaman Plummer because the surgery gave her #10 of the same and the pharmacy would not fill.

## 2020-06-26 NOTE — Progress Notes (Signed)
norco 10/325  Extra 10 tabs for post op pain post elbow surgery.

## 2020-06-28 ENCOUNTER — Ambulatory Visit: Payer: Medicare Other

## 2020-07-04 ENCOUNTER — Ambulatory Visit (INDEPENDENT_AMBULATORY_CARE_PROVIDER_SITE_OTHER): Payer: Medicare Other | Admitting: Orthopaedic Surgery

## 2020-07-04 ENCOUNTER — Encounter: Payer: Self-pay | Admitting: Orthopaedic Surgery

## 2020-07-04 VITALS — BP 141/82 | HR 80 | Ht 63.0 in | Wt 230.0 lb

## 2020-07-04 DIAGNOSIS — M7711 Lateral epicondylitis, right elbow: Secondary | ICD-10-CM

## 2020-07-04 NOTE — Progress Notes (Signed)
Post-Op Visit Note   Patient: Karina Greer           Date of Birth: 11/30/1965           MRN: [id number] Visit Date: 07/04/2020 PCP: Cyndi Bender, PA-C   Assessment & Plan:  Chief Complaint:  Chief Complaint  Patient presents with  . Right Elbow - Routine Post Op    06/26/2020 Right lateral epicondyle debridement, drilling and repair  Patient returns after the above procedure.  States that she is doing well.  Not complaining of much discomfort.   Visit Diagnoses:  1. Lateral epicondylitis, right elbow   Status post right elbow debridement, drilling and repair lateral epicondylitis June 26, 2020  Plan: Today new Steri-Strips applied to her wound.  She was put into a removable wrist splint.  Work on gentle range of motion of her elbow wrist and fingers.  No aggressive activity.  No heavy lifting, pushing, pulling with right arm.  Continue sling for comfort.  Follow-up in 5 weeks.  Follow-Up Instructions: Return in about 5 weeks (around 08/08/2020) for with dr yates recheck right elbow. .   Orders:  No orders of the defined types were placed in this encounter.  No orders of the defined types were placed in this encounter.   Imaging: No results found.  PMFS History: Patient Active Problem List   Diagnosis Date Noted  . Lateral epicondylitis, right elbow 05/25/2019  . Atherosclerosis of aorta ([place]) 10/28/2018  . Elevated glucose 10/28/2018  . Mixed hyperlipidemia 10/28/2018  . Hypothyroidism 07/20/2018  . LLQ pain 12/03/2017  . Pre-diabetes 06/03/2017  . Rectal bleeding 06/02/2017  . S/P cervical spinal fusion 03/11/2016  . Bilateral hand pain 11/29/2015  . Elevated C-reactive protein 11/29/2015  . Elevated rheumatoid factor 11/29/2015  . Raynaud's phenomenon without gangrene 11/29/2015  . Bloating 09/12/2014  . Dizziness 08/08/2014  . Peri-menopause 08/08/2014  . Chronic tension-type headache, intractable 07/13/2014  . Gastroesophageal reflux disease without  esophagitis 07/12/2014  . Anxiety 04/18/2014  . Chronic arthritis 04/18/2014  . Cystitis 04/18/2014  . Morbid obesity ([place]) 04/18/2014  . Fibromyalgia 04/18/2014  . 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[place]) 04/18/2014  . 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 Arthropathy 04/18/2014  . Cephalalgia 04/04/2014  . Numbness and tingling 04/04/2014  . 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 Nonspecific abnormal findings on radiological and other examination of skull and head 02/23/2013  . Other nonspecific abnormal result of function study of brain and central nervous system 07/01/2012  . Disturbance of skin sensation 07/01/2012  . Migraine without aura 07/01/2012  . Abdominal pain 02/11/2012  . Chronic fatigue syndrome 02/11/2012  . Chronic interstitial cystitis 02/11/2012  . Dyspareunia 02/11/2012  . Dysuria 02/11/2012  . Female stress incontinence 02/11/2012  . Irritable bowel syndrome with diarrhea 02/11/2012  . Nausea and vomiting 02/11/2012  . Nocturia 02/11/2012  . Urge incontinence 02/11/2012  . Urinary urgency 02/11/2012  . Lumbar spondylosis 02/04/2012  . Myalgia and myositis 02/04/2012  . Depression 02/04/2012  . Lumbosacral spondylosis without myelopathy 02/04/2012  . OBESITY, UNSPECIFIED 10/03/2010  . GENERALIZED ANXIETY DISORDER 10/03/2010  . Chest pain 10/03/2010   Past Medical History:  Diagnosis Date  . Anxiety   . Anxiety disorder   . Arthritis   . Blepharitis   . Chronic low back pain   . Depression   . Dyslipidemia   . Fibromyalgia   . GERD (gastroesophageal reflux disease)   . Headache    hx migraines  .  History of kidney stones   . History of panic attacks   . History of renal calculi   . IBS (irritable bowel syndrome)   . Interstitial cystitis   . Lupus ([place])    tested positive for antibodies for lupus. dr to do further tests  . Menorrhagia   . OSA on CPAP    not used cpap for several weeks  . Pneumonia    hx  . PONV  (postoperative nausea and vomiting)   . RLS (restless legs syndrome)   . Rosacea   . Seasonal asthma   . SI (sacroiliac) joint dysfunction   . SUI (stress urinary incontinence, female)   . UTI (lower urinary tract infection)    hx  . 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 Hypertension Mother   . Diabetes Father   . Brain cancer Father   . Fibromyalgia Sister   . Suicidality Sister   . Hypertension Other        Cancer, Cerebrovascular disease run on mother side of family    Past Surgical History:  Procedure Laterality Date  . ACHILLES TENDON SURGERY Right 3/16  . ANTERIOR CERVICAL DECOMP/DISCECTOMY FUSION N/A 03/11/2016   Procedure: C5-6, C6-7 Anterior Cervical Discectomy and Fusion, Allograft, Plate;  Surgeon: Marybelle Killings, MD;  Location: [place];  Service: Orthopedics;  Laterality: N/A;  . CHOLECYSTECTOMY  2014  . COLONOSCOPY WITH PROPOFOL N/A 08/25/2017   Procedure: COLONOSCOPY WITH PROPOFOL;  Surgeon: Manya Silvas, MD;  [facility];  Service: Endoscopy;  Laterality: N/A;  . CYSTO WITH HYDRODISTENSION N/A 01/26/2014   Procedure: CYSTOSCOPY/HYDRODISTENSION;  Surgeon: Bernestine Amass, MD;  Location: [facility];  Service: Urology;  Laterality: N/A;  . CYSTO/  URETHRAL DILATION/  HYDRODISTENTION/   INSTILLATION THERAPY  07-16-2010//   12-30-2007//   10-27-2006  . EXERCISE TOLERENCE TEST  10-12-2010   NEGATIVE  ADEQUATE ETT/  NO ISCHEMIA OR EVIDENCE HIGH GRADE OBSTRUCTIVE CAD/  NO FURTHER TEST NEEDED  . [place] ENDOMETRIAL ABLATION  2014  . LAPAROSCOPIC OVARIAN CYST BX  2005   AND  URETEROSCOPIC LASER LITHO  STONE EXTRACTION  . SHOULDER OPEN ROTATOR CUFF REPAIR Right 2004  . TONSILLECTOMY    . TRANSTHORACIC ECHOCARDIOGRAM  06-07-2006   normal study/  ef 60-65%  . TUBAL LIGATION Bilateral 1995   Social History   Occupational History  . Occupation: Agricultural consultant: UNEMPLOYED    Comment: Disability  Tobacco Use  . Smoking status: Never Smoker  . Smokeless tobacco: Never Used  Vaping Use  . Vaping Use: Never used  Substance and Sexual Activity  . Alcohol use: No  . Drug use: No  . Sexual activity: Not on file    Exam Wound looks good.  No drainage or signs of infection.  New Steri-Strips applied.  I have asked intact.  Moves fingers and wrist well.

## 2020-07-05 DIAGNOSIS — H9201 Otalgia, right ear: Secondary | ICD-10-CM | POA: Diagnosis not present

## 2020-07-05 DIAGNOSIS — Z6839 Body mass index (BMI) 39.0-39.9, adult: Secondary | ICD-10-CM | POA: Diagnosis not present

## 2020-07-05 DIAGNOSIS — M5481 Occipital neuralgia: Secondary | ICD-10-CM | POA: Diagnosis not present

## 2020-07-11 ENCOUNTER — Ambulatory Visit (INDEPENDENT_AMBULATORY_CARE_PROVIDER_SITE_OTHER): Payer: Medicare Other

## 2020-07-11 ENCOUNTER — Other Ambulatory Visit: Payer: Self-pay

## 2020-07-11 DIAGNOSIS — M792 Neuralgia and neuritis, unspecified: Secondary | ICD-10-CM | POA: Diagnosis not present

## 2020-07-11 DIAGNOSIS — J301 Allergic rhinitis due to pollen: Secondary | ICD-10-CM

## 2020-07-12 ENCOUNTER — Encounter: Payer: Medicare Other | Attending: Physical Medicine & Rehabilitation | Admitting: Registered Nurse

## 2020-07-12 ENCOUNTER — Encounter: Payer: Self-pay | Admitting: Registered Nurse

## 2020-07-12 VITALS — BP 141/82 | HR 80 | Ht 63.0 in | Wt 230.0 lb

## 2020-07-12 DIAGNOSIS — M797 Fibromyalgia: Secondary | ICD-10-CM

## 2020-07-12 DIAGNOSIS — M542 Cervicalgia: Secondary | ICD-10-CM | POA: Diagnosis not present

## 2020-07-12 DIAGNOSIS — Z5181 Encounter for therapeutic drug level monitoring: Secondary | ICD-10-CM | POA: Insufficient documentation

## 2020-07-12 DIAGNOSIS — M792 Neuralgia and neuritis, unspecified: Secondary | ICD-10-CM | POA: Diagnosis not present

## 2020-07-12 DIAGNOSIS — M47816 Spondylosis without myelopathy or radiculopathy, lumbar region: Secondary | ICD-10-CM | POA: Diagnosis not present

## 2020-07-12 DIAGNOSIS — M7062 Trochanteric bursitis, left hip: Secondary | ICD-10-CM | POA: Insufficient documentation

## 2020-07-12 DIAGNOSIS — Z79899 Other long term (current) drug therapy: Secondary | ICD-10-CM

## 2020-07-12 DIAGNOSIS — Z981 Arthrodesis status: Secondary | ICD-10-CM

## 2020-07-12 DIAGNOSIS — G894 Chronic pain syndrome: Secondary | ICD-10-CM | POA: Insufficient documentation

## 2020-07-12 NOTE — Progress Notes (Signed)
Subjective:    Patient ID: Karina Greer, female    DOB: 19-Oct-1965, 54 y.o.   MRN: [id number]  HPI: Karina Greer is a 54 y.o. female who called office asking if she could have a My-Chart visit due to recent surgery and her new neuralgia pain, right facial droop, neurology following her neurologic complaints.   She states her pain is located in her right hand post surgical pain, right facial pain ( neuralgia) radiaties from her neck into her right ear, lower back pain and left hip pain. She  Rates her pain 10. Her current exercise regime is walking in her home.   Karina Greer Morphine equivalent is 30.00  MME. She is also prescribed Clonazepam by Chipper Herb NP. We have discussed the black box warning of using opioids and benzodiazepines. I highlighted the dangers of using these drugs together and discussed the adverse events including respiratory suppression, overdose, cognitive impairment and importance of compliance with current regimen. We will continue to monitor and adjust as indicated.   Per Dr Lorin Mercy Note: 07/04/2020:  Right Elbow - Routine Post Op     06/26/2020 Right lateral epicondyle debridement, drilling and repair   Chipper Herb NP note was reviewed: 07/11/2020 [facility]: Neuralgia.     Oral Swab was Performed on 12/14/2019, it was consistent.   Pain Inventory Average Pain 10 Pain Right Now 10 My pain is constant and all saw her neurologist yesterday, will be having MRI @ [facility] of head   In the last 24 hours, has pain interfered with the following? General activity 10 Relation with others 10 Enjoyment of life 10 What TIME of day is your pain at its worst? morning , daytime, evening and night Sleep (in general) Poor  Pain is worse with: unsure and never gone Pain improves with: nothing helps the facial pain Relief from Meds: 0   The hydrocodone helps the other pain (hip) but doesn't touch the facial pain  Family History  Problem Relation Age of Onset  . Hypertension  Mother   . Diabetes Father   . Brain cancer Father   . Fibromyalgia Sister   . Suicidality Sister   . Hypertension Other        Cancer, Cerebrovascular disease run on mother side of family   Social History   Socioeconomic History  . Marital status: Divorced    Spouse name: Not on file  . Number of children: 3  . Years of education: HS  . Highest education level: 12th grade  Occupational History  . Occupation: Merchandiser, retail: UNEMPLOYED    Comment: Disability  Tobacco Use  . Smoking status: Never Smoker  . Smokeless tobacco: Never Used  Vaping Use  . Vaping Use: Never used  Substance and Sexual Activity  . Alcohol use: No  . Drug use: No  . Sexual activity: Not on file  Other Topics Concern  . Not on file  Social History Narrative   Patient is right-handed. She avoids caffeine. She has recently been using the treadmill.   Social Determinants of Health   Financial Resource Strain:   . Difficulty of Paying Living Expenses: Not on file  Food Insecurity:   . Worried About Charity fundraiser in the Last Year: Not on file  . Ran Out of Food in the Last Year: Not on file  Transportation Needs:   . Lack of Transportation (Medical): Not on file  . Lack of Transportation (Non-Medical): Not on file  Physical Activity:   . Days of Exercise per Week: Not on file  . Minutes of Exercise per Session: Not on file  Stress:   . Feeling of Stress : Not on file  Social Connections:   . Frequency of Communication with Friends and Family: Not on file  . Frequency of Social Gatherings with Friends and Family: Not on file  . Attends Religious Services: Not on file  . Active Member of Clubs or Organizations: Not on file  . Attends Archivist Meetings: Not on file  . Marital Status: Not on file   Past Surgical History:  Procedure Laterality Date  . ACHILLES TENDON SURGERY Right 3/16  . ANTERIOR CERVICAL DECOMP/DISCECTOMY FUSION N/A 03/11/2016   Procedure: C5-6, C6-7  Anterior Cervical Discectomy and Fusion, Allograft, Plate;  Surgeon: Marybelle Killings, MD;  Location: [place];  Service: Orthopedics;  Laterality: N/A;  . CHOLECYSTECTOMY  2014  . COLONOSCOPY WITH PROPOFOL N/A 08/25/2017   Procedure: COLONOSCOPY WITH PROPOFOL;  Surgeon: Manya Silvas, MD;  Location: [facility] ENDOSCOPY;  Service: Endoscopy;  Laterality: N/A;  . CYSTO WITH HYDRODISTENSION N/A 01/26/2014   Procedure: CYSTOSCOPY/HYDRODISTENSION;  Surgeon: Bernestine Amass, MD;  Location: [facility];  Service: Urology;  Laterality: N/A;  . CYSTO/  URETHRAL DILATION/  HYDRODISTENTION/   INSTILLATION THERAPY  07-16-2010//   12-30-2007//   10-27-2006  . EXERCISE TOLERENCE TEST  10-12-2010   NEGATIVE  ADEQUATE ETT/  NO ISCHEMIA OR EVIDENCE HIGH GRADE OBSTRUCTIVE CAD/  NO FURTHER TEST NEEDED  . [place] ENDOMETRIAL ABLATION  2014  . LAPAROSCOPIC OVARIAN CYST BX  2005   AND  URETEROSCOPIC LASER LITHO  STONE EXTRACTION  . SHOULDER OPEN ROTATOR CUFF REPAIR Right 2004  . TONSILLECTOMY    . TRANSTHORACIC ECHOCARDIOGRAM  06-07-2006   normal study/  ef 60-65%  . TUBAL LIGATION Bilateral 1995   Past Surgical History:  Procedure Laterality Date  . ACHILLES TENDON SURGERY Right 3/16  . ANTERIOR CERVICAL DECOMP/DISCECTOMY FUSION N/A 03/11/2016   Procedure: C5-6, C6-7 Anterior Cervical Discectomy and Fusion, Allograft, Plate;  Surgeon: Marybelle Killings, MD;  Location: [place];  Service: Orthopedics;  Laterality: N/A;  . CHOLECYSTECTOMY  2014  . COLONOSCOPY WITH PROPOFOL N/A 08/25/2017   Procedure: COLONOSCOPY WITH PROPOFOL;  Surgeon: Manya Silvas, MD;  Location: [facility] ENDOSCOPY;  Service: Endoscopy;  Laterality: N/A;  . CYSTO WITH HYDRODISTENSION N/A 01/26/2014   Procedure: CYSTOSCOPY/HYDRODISTENSION;  Surgeon: Bernestine Amass, MD;  Location: [facility];  Service: Urology;  Laterality: N/A;  . CYSTO/  URETHRAL DILATION/  HYDRODISTENTION/   INSTILLATION THERAPY  07-16-2010//    12-30-2007//   10-27-2006  . EXERCISE TOLERENCE TEST  10-12-2010   NEGATIVE  ADEQUATE ETT/  NO ISCHEMIA OR EVIDENCE HIGH GRADE OBSTRUCTIVE CAD/  NO FURTHER TEST NEEDED  . [place] ENDOMETRIAL ABLATION  2014  . LAPAROSCOPIC OVARIAN CYST BX  2005   AND  URETEROSCOPIC LASER LITHO  STONE EXTRACTION  . SHOULDER OPEN ROTATOR CUFF REPAIR Right 2004  . TONSILLECTOMY    . TRANSTHORACIC ECHOCARDIOGRAM  06-07-2006   normal study/  ef 60-65%  . TUBAL LIGATION Bilateral 1995   Past Medical History:  Diagnosis Date  . Anxiety   . Anxiety disorder   . Arthritis   . Blepharitis   . Chronic low back pain   . Depression   . Dyslipidemia   . Fibromyalgia   . GERD (gastroesophageal reflux disease)   .  Headache    hx migraines  . History of kidney stones   . History of panic attacks   . History of renal calculi   . IBS (irritable bowel syndrome)   . Interstitial cystitis   . Lupus ([place])    tested positive for antibodies for lupus. dr to do further tests  . Menorrhagia   . OSA on CPAP    not used cpap for several weeks  . Pneumonia    hx  . PONV (postoperative nausea and vomiting)   . RLS (restless legs syndrome)   . Rosacea   . Seasonal asthma   . SI (sacroiliac) joint dysfunction   . SUI (stress urinary incontinence, female)   . UTI (lower urinary tract infection)    hx  . 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 02/23/2013   BP (!) 141/82 Comment: last recorded, pt is at home  Pulse 80 Comment: last recorded, pt is at home  Ht 5\' 3"  (1.6 m) Comment: last recorded, pt is at home  Wt 230 lb (104.3 kg) Comment: last recorded, pt is at home  BMI 40.74 kg/m   Opioid Risk Score:   Fall Risk Score:  `1  Depression screen PHQ 2/9  Depression screen [facility] 2/9 07/12/2020 04/14/2020 03/21/2020 03/17/2020 08/18/2019 06/21/2019 08/12/2018  Decreased Interest 1 0 0 2 2 0 0  Down, Depressed, Hopeless 1 0 0 2 2 0 0  PHQ - 2 Score 2 0 0 4 4 0 0  Altered sleeping - - 0 - - - -    Tired, decreased energy - - 0 - - - -  Change in appetite - - 0 - - - -  Feeling bad or failure about yourself  - - 0 - - - -  Trouble concentrating - - 0 - - - -  Moving slowly or fidgety/restless - - 0 - - - -  Suicidal thoughts - - 0 - - - -  PHQ-9 Score - - 0 - - - -  Difficult doing work/chores - - - - - - -  Some recent data might be hidden    Review of Systems  Constitutional: Negative.   HENT:       Facial pain/numbness right side ? Bells palsy?   Eyes:       Right eye will not close, using tape  Respiratory: Negative.   Cardiovascular: Negative.   Gastrointestinal: Negative.   Genitourinary: Negative.   Musculoskeletal:       Hip pain left  Skin: Negative.   Allergic/Immunologic: Negative.   Neurological: Positive for facial asymmetry and numbness.       Tingling-- saw neurologist yesterday  Hematological: Negative.   Psychiatric/Behavioral: Positive for dysphoric mood. The patient is nervous/anxious.   All other systems reviewed and are negative.      Objective:   Physical Exam Vitals and nursing note reviewed.  HENT:     Head:     Comments: Right Facial Droop Noted.  Musculoskeletal:     Comments: No Physical Exam Performed: MY Chart Video Visit           Assessment & Plan:  1. Lumbago/ Lumbar Spondylosis/LeftLumbar Radiculitis: Continue to Monitor.07/12/2020. Refilled: HYDROcodone 10/325mg  one tablet every8hours as needed #90. We will continue the opioid monitoring program, this consists of regular clinic visits, examinations, urine drug screen, pill counts as well as use of [place] Controlled Substance Reporting system. A 12 month History has been reviewed on the [place]  Controlled Substance Reporting Systemon 07/12/2020. 2. Fibromyalgia. Continue Current exercise Regime.07/12/2020 3. Anxiety and depression:Psychiatry [facility]. Continue Counseling at [facility].07/12/2020 4.  Migraines: On [place]. Neurology Following.07/12/2020 5. OSA :PCP Following. Continue to Monitor.07/12/2020 6. Obesity: Following Healthy Diet Regimenand Continue HEP as Tolerated.07/12/2020 7. Status Post Cervical Spinal Fusion: C5C6- C-6- C-7 Anterior Cervical Discectomy Fusion and Allograft Plate:Dr. Lorin Mercy Following.06/12/2020 8. Cervicalgia/ Cervical Radiculitis/ S/P Cervical Spinal Fusion:Karina Greer states she is awaiting on a surgery date, she will update the office when she has a surgical date.Continueto MonitorDr. Lorin Mercy Following.07/12/2020 9. Muscle Spasm: Continue Tizanidine as needed.07/12/2020 10.Hereditary and Idiopathic Peripheral Neuropathy Continue with Tens Unit.07/12/2020. 11.Left  Greater Trochanteric Bursitis:Continue to Alternate Ice andheat Therapy. Continue to Monitor.07/12/2020. 12. Chronic Bilateral Knee Pain:No Complaints Today.Continue HEP as Tolerated. Continue to Monitor.07/12/2020 13. Lateral Epicondylitis of Right Elbow: Ortho Following. S/P  06/26/2020 Right lateral epicondyle debridement, drilling and repair  By Dr Lorin Mercy.  14. Neuralgia/ Right Facial Droop: Neurology following. She is scheduled for a MRI: She States. Karina Greer was instructed to call Neurology to obtain scheduled date for MRI, she verbalizes understanding.   My- Chart Video Visit Established Patient Location of Patient: In Her Home Location of Provider: In the Office

## 2020-07-13 ENCOUNTER — Other Ambulatory Visit: Payer: Self-pay | Admitting: Acute Care

## 2020-07-13 DIAGNOSIS — R519 Headache, unspecified: Secondary | ICD-10-CM

## 2020-07-13 DIAGNOSIS — M792 Neuralgia and neuritis, unspecified: Secondary | ICD-10-CM

## 2020-07-17 ENCOUNTER — Other Ambulatory Visit: Payer: Self-pay | Admitting: Registered Nurse

## 2020-07-17 ENCOUNTER — Telehealth: Payer: Self-pay | Admitting: Registered Nurse

## 2020-07-17 DIAGNOSIS — M797 Fibromyalgia: Secondary | ICD-10-CM

## 2020-07-17 MED ORDER — HYDROCODONE-ACETAMINOPHEN 10-325 MG PO TABS: ORAL_TABLET | ORAL | 0 refills | Status: DC

## 2020-07-17 MED FILL — HYDROCODON-APAP 10-325: 10-325 | 30 days supply | Qty: 90 | Fill #0

## 2020-07-17 NOTE — Telephone Encounter (Signed)
PMP was reviewed; Hydrocodone e-scribed. [place] was called.  Spoke with Ms. Totty regarding the above.

## 2020-07-26 ENCOUNTER — Other Ambulatory Visit: Payer: Self-pay | Admitting: Acute Care

## 2020-07-26 ENCOUNTER — Ambulatory Visit (HOSPITAL_COMMUNITY)
Admission: RE | Admit: 2020-07-26 | Discharge: 2020-07-26 | Disposition: A | Discharge status: Home / Self Care | Payer: Medicare Other | Source: Ambulatory Visit | Attending: Acute Care | Admitting: Acute Care

## 2020-07-26 ENCOUNTER — Other Ambulatory Visit: Payer: Self-pay

## 2020-07-26 DIAGNOSIS — R519 Headache, unspecified: Secondary | ICD-10-CM

## 2020-07-26 DIAGNOSIS — G500 Trigeminal neuralgia: Secondary | ICD-10-CM | POA: Diagnosis not present

## 2020-07-26 DIAGNOSIS — M792 Neuralgia and neuritis, unspecified: Secondary | ICD-10-CM

## 2020-07-26 DIAGNOSIS — Z8709 Personal history of other diseases of the respiratory system: Secondary | ICD-10-CM | POA: Diagnosis not present

## 2020-07-26 DIAGNOSIS — R9082 White matter disease, unspecified: Secondary | ICD-10-CM | POA: Diagnosis not present

## 2020-07-26 DIAGNOSIS — H05311 Atrophy of right orbit: Secondary | ICD-10-CM

## 2020-07-26 DIAGNOSIS — R200 Anesthesia of skin: Secondary | ICD-10-CM | POA: Diagnosis not present

## 2020-07-26 DIAGNOSIS — G501 Atypical facial pain: Secondary | ICD-10-CM | POA: Diagnosis not present

## 2020-08-01 ENCOUNTER — Ambulatory Visit: Payer: Medicare Other

## 2020-08-01 DIAGNOSIS — Z6841 Body Mass Index (BMI) 40.0 and over, adult: Secondary | ICD-10-CM | POA: Diagnosis not present

## 2020-08-01 DIAGNOSIS — R4586 Emotional lability: Secondary | ICD-10-CM | POA: Diagnosis not present

## 2020-08-01 DIAGNOSIS — M792 Neuralgia and neuritis, unspecified: Secondary | ICD-10-CM | POA: Diagnosis not present

## 2020-08-01 DIAGNOSIS — R519 Headache, unspecified: Secondary | ICD-10-CM | POA: Diagnosis not present

## 2020-08-07 DIAGNOSIS — K219 Gastro-esophageal reflux disease without esophagitis: Secondary | ICD-10-CM | POA: Diagnosis not present

## 2020-08-07 DIAGNOSIS — Z6841 Body Mass Index (BMI) 40.0 and over, adult: Secondary | ICD-10-CM | POA: Diagnosis not present

## 2020-08-07 DIAGNOSIS — E559 Vitamin D deficiency, unspecified: Secondary | ICD-10-CM | POA: Diagnosis not present

## 2020-08-07 DIAGNOSIS — E7800 Pure hypercholesterolemia, unspecified: Secondary | ICD-10-CM | POA: Diagnosis not present

## 2020-08-07 DIAGNOSIS — M810 Age-related osteoporosis without current pathological fracture: Secondary | ICD-10-CM | POA: Diagnosis not present

## 2020-08-07 DIAGNOSIS — L859 Epidermal thickening, unspecified: Secondary | ICD-10-CM | POA: Diagnosis not present

## 2020-08-07 DIAGNOSIS — R7303 Prediabetes: Secondary | ICD-10-CM | POA: Diagnosis not present

## 2020-08-07 DIAGNOSIS — G43009 Migraine without aura, not intractable, without status migrainosus: Secondary | ICD-10-CM | POA: Diagnosis not present

## 2020-08-07 DIAGNOSIS — M5481 Occipital neuralgia: Secondary | ICD-10-CM | POA: Diagnosis not present

## 2020-08-07 DIAGNOSIS — N951 Menopausal and female climacteric states: Secondary | ICD-10-CM | POA: Diagnosis not present

## 2020-08-07 DIAGNOSIS — G521 Disorders of glossopharyngeal nerve: Secondary | ICD-10-CM | POA: Diagnosis not present

## 2020-08-08 ENCOUNTER — Ambulatory Visit (INDEPENDENT_AMBULATORY_CARE_PROVIDER_SITE_OTHER): Payer: Medicare Other | Admitting: Orthopaedic Surgery

## 2020-08-08 ENCOUNTER — Other Ambulatory Visit: Payer: Self-pay

## 2020-08-08 ENCOUNTER — Encounter: Payer: Self-pay | Admitting: Orthopaedic Surgery

## 2020-08-08 VITALS — BP 131/85 | HR 76 | Ht 63.0 in | Wt 230.0 lb

## 2020-08-08 DIAGNOSIS — M7711 Lateral epicondylitis, right elbow: Secondary | ICD-10-CM

## 2020-08-08 NOTE — Progress Notes (Signed)
Post-Op Visit Note   Patient: Karina Greer           Date of Birth: 1966-06-17           MRN: [id number] Visit Date: 08/08/2020 PCP: Cyndi Bender, PA-C   Assessment & Plan: Post right lateral epicondylar release and repair with drilling.  Incision looks good she will apply lotion.  Good relief of preop symptoms.  She is continuing a wrist splint.  She will avoid gripping squeezing till after Christmas.  She has had problems with recurrent Bell's palsy and is referred to see a neurologist at [facility] for abnormal brain MRI.  She can follow-up with me on an as-needed basis.  Chief Complaint:  Chief Complaint  Patient presents with  . Right Elbow - Follow-up    06/26/2020 right lateral epicondyle debridement, drilling, and repair   Visit Diagnoses:  1. Lateral epicondylitis, right elbow     Plan: Patient to have the surgical result she can follow-up with me as needed.  Apply lotion to the incision.  Follow-Up Instructions: No follow-ups on file.   Orders:  No orders of the defined types were placed in this encounter.  No orders of the defined types were placed in this encounter.   Imaging: No results found.  PMFS History: Patient Active Problem List   Diagnosis Date Noted  . Lateral epicondylitis, right elbow 05/25/2019  . Atherosclerosis of aorta ([place]) 10/28/2018  . Elevated glucose 10/28/2018  . Mixed hyperlipidemia 10/28/2018  . Hypothyroidism 07/20/2018  . LLQ pain 12/03/2017  . Pre-diabetes 06/03/2017  . Rectal bleeding 06/02/2017  . S/P cervical spinal fusion 03/11/2016  . Bilateral hand pain 11/29/2015  . Elevated C-reactive protein 11/29/2015  . Elevated rheumatoid factor 11/29/2015  . Raynaud's phenomenon without gangrene 11/29/2015  . Bloating 09/12/2014  . Dizziness 08/08/2014  . Peri-menopause 08/08/2014  . Chronic tension-type headache, intractable 07/13/2014  . Gastroesophageal reflux disease without esophagitis 07/12/2014  . Anxiety 04/18/2014  .  Chronic arthritis 04/18/2014  . Cystitis 04/18/2014  . Morbid obesity ([place]) 04/18/2014  . Fibromyalgia 04/18/2014  . 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[place]) 04/18/2014  . 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 Arthropathy 04/18/2014  . Cephalalgia 04/04/2014  . Numbness and tingling 04/04/2014  . 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 Nonspecific abnormal findings on radiological and other examination of skull and head 02/23/2013  . Other nonspecific abnormal result of function study of brain and central nervous system 07/01/2012  . Disturbance of skin sensation 07/01/2012  . Migraine without aura 07/01/2012  . Abdominal pain 02/11/2012  . Chronic fatigue syndrome 02/11/2012  . Chronic interstitial cystitis 02/11/2012  . Dyspareunia 02/11/2012  . Dysuria 02/11/2012  . Female stress incontinence 02/11/2012  . Irritable bowel syndrome with diarrhea 02/11/2012  . Nausea and vomiting 02/11/2012  . Nocturia 02/11/2012  . Urge incontinence 02/11/2012  . Urinary urgency 02/11/2012  . Lumbar spondylosis 02/04/2012  . Myalgia and myositis 02/04/2012  . Depression 02/04/2012  . Lumbosacral spondylosis without myelopathy 02/04/2012  . OBESITY, UNSPECIFIED 10/03/2010  . GENERALIZED ANXIETY DISORDER 10/03/2010  . Chest pain 10/03/2010   Past Medical History:  Diagnosis Date  . Anxiety   . Anxiety disorder   . Arthritis   . Blepharitis   . Chronic low back pain   . Depression   . Dyslipidemia   . Fibromyalgia   . GERD (gastroesophageal reflux disease)   . Headache    hx migraines  . History of kidney stones   .  History of panic attacks   . History of renal calculi   . IBS (irritable bowel syndrome)   . Interstitial cystitis   . Lupus ([place])    tested positive for antibodies for lupus. dr to do further tests  . Menorrhagia   . OSA on CPAP    not used cpap for several weeks  . Pneumonia    hx  . PONV (postoperative nausea and vomiting)   . RLS (restless  legs syndrome)   . Rosacea   . Seasonal asthma   . SI (sacroiliac) joint dysfunction   . SUI (stress urinary incontinence, female)   . UTI (lower urinary tract infection)    hx  . 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 Hypertension Mother   . Diabetes Father   . Brain cancer Father   . Fibromyalgia Sister   . Suicidality Sister   . Hypertension Other        Cancer, Cerebrovascular disease run on mother side of family    Past Surgical History:  Procedure Laterality Date  . ACHILLES TENDON SURGERY Right 3/16  . ANTERIOR CERVICAL DECOMP/DISCECTOMY FUSION N/A 03/11/2016   Procedure: C5-6, C6-7 Anterior Cervical Discectomy and Fusion, Allograft, Plate;  Surgeon: Marybelle Killings, MD;  Location: [place];  Service: Orthopedics;  Laterality: N/A;  . CHOLECYSTECTOMY  2014  . COLONOSCOPY WITH PROPOFOL N/A 08/25/2017   Procedure: COLONOSCOPY WITH PROPOFOL;  Surgeon: Manya Silvas, MD;  Location: [facility] ENDOSCOPY;  Service: Endoscopy;  Laterality: N/A;  . CYSTO WITH HYDRODISTENSION N/A 01/26/2014   Procedure: CYSTOSCOPY/HYDRODISTENSION;  Surgeon: Bernestine Amass, MD;  Location: [facility];  Service: Urology;  Laterality: N/A;  . CYSTO/  URETHRAL DILATION/  HYDRODISTENTION/   INSTILLATION THERAPY  07-16-2010//   12-30-2007//   10-27-2006  . EXERCISE TOLERENCE TEST  10-12-2010   NEGATIVE  ADEQUATE ETT/  NO ISCHEMIA OR EVIDENCE HIGH GRADE OBSTRUCTIVE CAD/  NO FURTHER TEST NEEDED  . [place] ENDOMETRIAL ABLATION  2014  . LAPAROSCOPIC OVARIAN CYST BX  2005   AND  URETEROSCOPIC LASER LITHO  STONE EXTRACTION  . SHOULDER OPEN ROTATOR CUFF REPAIR Right 2004  . TONSILLECTOMY    . TRANSTHORACIC ECHOCARDIOGRAM  06-07-2006   normal study/  ef 60-65%  . TUBAL LIGATION Bilateral 1995   Social History   Occupational History  . Occupation: Merchandiser, retail: UNEMPLOYED    Comment: Disability  Tobacco Use  .  Smoking status: Never Smoker  . Smokeless tobacco: Never Used  Vaping Use  . Vaping Use: Never used  Substance and Sexual Activity  . Alcohol use: No  . Drug use: No  . Sexual activity: Not on file

## 2020-08-14 ENCOUNTER — Encounter: Payer: Medicare Other | Attending: Physical Medicine & Rehabilitation | Admitting: Registered Nurse

## 2020-08-14 ENCOUNTER — Other Ambulatory Visit: Payer: Self-pay

## 2020-08-14 ENCOUNTER — Other Ambulatory Visit: Payer: Self-pay | Admitting: Registered Nurse

## 2020-08-14 ENCOUNTER — Encounter: Payer: Self-pay | Admitting: Registered Nurse

## 2020-08-14 VITALS — BP 133/82 | HR 75 | Temp 98.1°F | Ht 63.0 in | Wt 234.8 lb

## 2020-08-14 DIAGNOSIS — G894 Chronic pain syndrome: Secondary | ICD-10-CM

## 2020-08-14 DIAGNOSIS — M47816 Spondylosis without myelopathy or radiculopathy, lumbar region: Secondary | ICD-10-CM

## 2020-08-14 DIAGNOSIS — M7061 Trochanteric bursitis, right hip: Secondary | ICD-10-CM

## 2020-08-14 DIAGNOSIS — M5412 Radiculopathy, cervical region: Secondary | ICD-10-CM

## 2020-08-14 DIAGNOSIS — M25571 Pain in right ankle and joints of right foot: Secondary | ICD-10-CM

## 2020-08-14 DIAGNOSIS — G8929 Other chronic pain: Secondary | ICD-10-CM | POA: Diagnosis not present

## 2020-08-14 DIAGNOSIS — Z79899 Other long term (current) drug therapy: Secondary | ICD-10-CM

## 2020-08-14 DIAGNOSIS — M7062 Trochanteric bursitis, left hip: Secondary | ICD-10-CM | POA: Diagnosis not present

## 2020-08-14 DIAGNOSIS — M797 Fibromyalgia: Secondary | ICD-10-CM | POA: Diagnosis not present

## 2020-08-14 DIAGNOSIS — Z5181 Encounter for therapeutic drug level monitoring: Secondary | ICD-10-CM | POA: Diagnosis not present

## 2020-08-14 DIAGNOSIS — M62838 Other muscle spasm: Secondary | ICD-10-CM

## 2020-08-14 DIAGNOSIS — Z981 Arthrodesis status: Secondary | ICD-10-CM | POA: Diagnosis not present

## 2020-08-14 DIAGNOSIS — M542 Cervicalgia: Secondary | ICD-10-CM | POA: Diagnosis not present

## 2020-08-14 MED ORDER — HYDROCODONE-ACETAMINOPHEN 10-325 MG PO TABS: ORAL_TABLET | ORAL | 0 refills | Status: DC

## 2020-08-14 MED ORDER — TIZANIDINE HCL 2 MG PO TABS: 2.0000 mg | ORAL_TABLET | Freq: Three times a day (TID) | ORAL | 2 refills | Status: DC | PRN

## 2020-08-14 MED FILL — HYDROCODON-APAP 10-325: 10-325 | 30 days supply | Qty: 90 | Fill #0

## 2020-08-14 MED FILL — tiZANidine HCL 2 MG TABS: 2 | 16 days supply | Qty: 50 | Fill #0

## 2020-08-14 NOTE — Progress Notes (Signed)
Subjective:    Patient ID: Karina Greer, female    DOB: Apr 20, 1966, 54 y.o.   MRN: [id number]  Karina Greer is a 54 y.o. female who returns for follow up appointment for chronic pain and medication refill. She states her pain is located in her lower back, bilateral hips and right ankle pain. Also reports she has right arm post surgical pain. She rates her pain 7. Her  current exercise regime is walking.  Karina Greer equivalent is 30.00 MME.  UDS ordered today.    Pain Inventory Average Pain 9 Pain Right Now 7 My pain is constant, sharp and tingling  In the last 24 hours, has pain interfered with the following? General activity 8 Relation with others 10 Enjoyment of life 7 What TIME of day is your pain at its worst? morning , daytime, evening and night Sleep (in general) Fair  Pain is worse with: walking, bending, sitting, inactivity, standing, unsure and some activites Pain improves with: rest, heat/ice, therapy/exercise, pacing activities, medication, TENS and injections Relief from Meds: 7  Family History  Problem Relation Age of Onset  . Hypertension Mother   . Diabetes Father   . Brain cancer Father   . Fibromyalgia Sister   . Suicidality Sister   . Hypertension Other        Cancer, Cerebrovascular disease run on mother side of family   Social History   Socioeconomic History  . Marital status: Divorced    Spouse name: Not on file  . Number of children: 3  . Years of education: HS  . Highest education level: 12th grade  Occupational History  . Occupation: Merchandiser, retail: UNEMPLOYED    Comment: Disability  Tobacco Use  . Smoking status: Never Smoker  . Smokeless tobacco: Never Used  Vaping Use  . Vaping Use: Never used  Substance and Sexual Activity  . Alcohol use: No  . Drug use: No  . Sexual activity: Not on file  Other Topics Concern  . Not on file  Social History Narrative   Patient is right-handed. She avoids caffeine. She  has recently been using the treadmill.   Social Determinants of Health   Financial Resource Strain:   . Difficulty of Paying Living Expenses: Not on file  Food Insecurity:   . Worried About Charity fundraiser in the Last Year: Not on file  . Ran Out of Food in the Last Year: Not on file  Transportation Needs:   . Lack of Transportation (Medical): Not on file  . Lack of Transportation (Non-Medical): Not on file  Physical Activity:   . Days of Exercise per Week: Not on file  . Minutes of Exercise per Session: Not on file  Stress:   . Feeling of Stress : Not on file  Social Connections:   . Frequency of Communication with Friends and Family: Not on file  . Frequency of Social Gatherings with Friends and Family: Not on file  . Attends Religious Services: Not on file  . Active Member of Clubs or Organizations: Not on file  . Attends Archivist Meetings: Not on file  . Marital Status: Not on file   Past Surgical History:  Procedure Laterality Date  . ACHILLES TENDON SURGERY Right 3/16  . ANTERIOR CERVICAL DECOMP/DISCECTOMY FUSION N/A 03/11/2016   Procedure: C5-6, C6-7 Anterior Cervical Discectomy and Fusion, Allograft, Plate;  Surgeon: Marybelle Killings, MD;  Location: [facility];  Service: Orthopedics;  Laterality: N/A;  .  CHOLECYSTECTOMY  2014  . COLONOSCOPY WITH PROPOFOL N/A 08/25/2017   Procedure: COLONOSCOPY WITH PROPOFOL;  Surgeon: Manya Silvas, MD;  Location: [facility] ENDOSCOPY;  Service: Endoscopy;  Laterality: N/A;  . CYSTO WITH HYDRODISTENSION N/A 01/26/2014   Procedure: CYSTOSCOPY/HYDRODISTENSION;  Surgeon: Bernestine Amass, MD;  Location: [facility];  Service: Urology;  Laterality: N/A;  . CYSTO/  URETHRAL DILATION/  HYDRODISTENTION/   INSTILLATION THERAPY  07-16-2010//   12-30-2007//   10-27-2006  . EXERCISE TOLERENCE TEST  10-12-2010   NEGATIVE  ADEQUATE ETT/  NO ISCHEMIA OR EVIDENCE HIGH GRADE OBSTRUCTIVE CAD/  NO FURTHER TEST NEEDED  . [place] ENDOMETRIAL ABLATION  2014  . LAPAROSCOPIC OVARIAN CYST BX  2005   AND  URETEROSCOPIC LASER LITHO  STONE EXTRACTION  . SHOULDER OPEN ROTATOR CUFF REPAIR Right 2004  . TONSILLECTOMY    . TRANSTHORACIC ECHOCARDIOGRAM  06-07-2006   normal study/  ef 60-65%  . TUBAL LIGATION Bilateral 1995   Past Surgical History:  Procedure Laterality Date  . ACHILLES TENDON SURGERY Right 3/16  . ANTERIOR CERVICAL DECOMP/DISCECTOMY FUSION N/A 03/11/2016   Procedure: C5-6, C6-7 Anterior Cervical Discectomy and Fusion, Allograft, Plate;  Surgeon: Marybelle Killings, MD;  Location: [facility];  Service: Orthopedics;  Laterality: N/A;  . CHOLECYSTECTOMY  2014  . COLONOSCOPY WITH PROPOFOL N/A 08/25/2017   Procedure: COLONOSCOPY WITH PROPOFOL;  Surgeon: Manya Silvas, MD;  Location: [facility] ENDOSCOPY;  Service: Endoscopy;  Laterality: N/A;  . CYSTO WITH HYDRODISTENSION N/A 01/26/2014   Procedure: CYSTOSCOPY/HYDRODISTENSION;  Surgeon: Bernestine Amass, MD;  Location: [facility];  Service: Urology;  Laterality: N/A;  . CYSTO/  URETHRAL DILATION/  HYDRODISTENTION/   INSTILLATION THERAPY  07-16-2010//   12-30-2007//   10-27-2006  . EXERCISE TOLERENCE TEST  10-12-2010   NEGATIVE  ADEQUATE ETT/  NO ISCHEMIA OR EVIDENCE HIGH GRADE OBSTRUCTIVE CAD/  NO FURTHER TEST NEEDED  . [place] ENDOMETRIAL ABLATION  2014  . LAPAROSCOPIC OVARIAN CYST BX  2005   AND  URETEROSCOPIC LASER LITHO  STONE EXTRACTION  . SHOULDER OPEN ROTATOR CUFF REPAIR Right 2004  . TONSILLECTOMY    . TRANSTHORACIC ECHOCARDIOGRAM  06-07-2006   normal study/  ef 60-65%  . TUBAL LIGATION Bilateral 1995   Past Medical History:  Diagnosis Date  . Anxiety   . Anxiety disorder   . Arthritis   . Blepharitis   . Chronic low back pain   . Depression   . Dyslipidemia   . Fibromyalgia   . GERD (gastroesophageal reflux disease)   . Headache    hx migraines  . History of kidney stones   . History of panic attacks   . History  of renal calculi   . IBS (irritable bowel syndrome)   . Interstitial cystitis   . Lupus ([place])    tested positive for antibodies for lupus. dr to do further tests  . Menorrhagia   . OSA on CPAP    not used cpap for several weeks  . Pneumonia    hx  . PONV (postoperative nausea and vomiting)   . RLS (restless legs syndrome)   . Rosacea   . Seasonal asthma   . SI (sacroiliac) joint dysfunction   . SUI (stress urinary incontinence, female)   . UTI (lower urinary tract infection)    hx  . 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 02/23/2013   There were no vitals taken for this visit.  Opioid Risk Score:   Fall Risk Score:  `1  Depression screen PHQ 2/9  Depression screen [facility] 2/9 07/12/2020 04/14/2020 03/21/2020 03/17/2020 08/18/2019 06/21/2019 08/12/2018  Decreased Interest 1 0 0 2 2 0 0  Down, Depressed, Hopeless 1 0 0 2 2 0 0  PHQ - 2 Score 2 0 0 4 4 0 0  Altered sleeping - - 0 - - - -  Tired, decreased energy - - 0 - - - -  Change in appetite - - 0 - - - -  Feeling bad or failure about yourself  - - 0 - - - -  Trouble concentrating - - 0 - - - -  Moving slowly or fidgety/restless - - 0 - - - -  Suicidal thoughts - - 0 - - - -  PHQ-9 Score - - 0 - - - -  Difficult doing work/chores - - - - - - -  Some recent data might be hidden   Review of Systems  Musculoskeletal: Positive for arthralgias, back pain and neck pain.  All other systems reviewed and are negative.      Objective:   Physical Exam Vitals and nursing note reviewed.  Constitutional:      Appearance: Normal appearance. She is obese.  Neck:     Comments: Cervical Paraspinal Tenderness: C-5-C-6 Cardiovascular:     Rate and Rhythm: Normal rate and regular rhythm.     Pulses: Normal pulses.     Heart sounds: Normal heart sounds.  Pulmonary:     Effort: Pulmonary effort is normal.     Breath sounds: Normal breath sounds.  Musculoskeletal:     Cervical back: Normal range of motion and neck supple.      Comments: Normal Muscle Bulk and Muscle Testing Reveals:  Upper Extremities: Full ROM and Muscle Strength on Right 4/5 and left 5/5 . Wearing Right Hand Splint Right AC Joint Tenderness  Lumbar Paraspinal Tenderness: L-4-L-5 Lower Extremities: Full ROM and Muscle Strength 5/5 Arises from chair slowly Narrow Based  Gait   Skin:    General: Skin is warm and dry.  Neurological:     Mental Status: She is alert and oriented to person, place, and time.  Psychiatric:        Mood and Affect: Mood normal.        Behavior: Behavior normal.           Assessment & Plan:  1. Lumbago/ Lumbar Spondylosis/LeftLumbar Radiculitis: Continue to Monitor.08/14/2020. Refilled: HYDROcodone 10/325mg  one tablet every8hours as needed #90. We will continue the opioid monitoring program, this consists of regular clinic visits, examinations, urine drug screen, pill counts as well as use of [place] Controlled Substance Reporting system. A 12 month History has been reviewed on the [place] Controlled Substance Reporting Systemon 08/14/2020. 2. Fibromyalgia. Continue Current exercise Regime.08/14/2020 3. Anxiety and depression:[facility]iatry [facility] Continue Counseling at [facility]. Karina Greer is in the process of obtaing counseling at another office11/22/2021 4. Migraines: On [place]. Neurology Following.08/14/2020 5. OSA :PCP Following. Continue to Monitor.08/14/2020 6. Obesity: Following Healthy Diet Regimenand Continue HEP as Tolerated.08/14/2020 7. Status Post Cervical Spinal Fusion: C5C6- C-6- C-7 Anterior Cervical Discectomy Fusion and Allograft Plate:Dr. Lorin Mercy Following.08/14/2020 8. Cervicalgia/ Cervical Radiculitis/ S/P Cervical Spinal Fusion:Karina Greer states she is awaiting on a surgery date, she will update the office when she has a surgical date.Continueto MonitorDr. Lorin Mercy Following.08/14/2020 9. Muscle Spasm: Continue  Tizanidine as needed.08/14/2020 10.Hereditary and Idiopathic Peripheral Neuropathy Continue with  Tens Unit.08/14/2020. 11.BilateralGreater Trochanteric Bursitis:Continue to Alternate Ice andheat Therapy. Continue to Monitor.07/12/2020. 12. Chronic Bilateral Knee Pain:No Complaints Today.Continue HEP as Tolerated. Continue to Monitor.08/14/2020 13. Lateral Epicondylitis of Right Elbow: Ortho Following. S/P  06/26/2020 Right lateral epicondyle debridement, drilling and repair  By Dr Lorin Mercy. Continue to monitor. 08/14/2020 14. Neuralgia/ Right Facial Droop: Neurology following.  F/U in 1 month

## 2020-08-22 ENCOUNTER — Ambulatory Visit (INDEPENDENT_AMBULATORY_CARE_PROVIDER_SITE_OTHER): Payer: Medicare Other

## 2020-08-22 ENCOUNTER — Other Ambulatory Visit: Payer: Self-pay

## 2020-08-22 DIAGNOSIS — J301 Allergic rhinitis due to pollen: Secondary | ICD-10-CM | POA: Diagnosis not present

## 2020-08-22 DIAGNOSIS — E119 Type 2 diabetes mellitus without complications: Secondary | ICD-10-CM | POA: Diagnosis not present

## 2020-08-23 LAB — TOXASSURE SELECT,+ANTIDEPR,UR

## 2020-08-25 DIAGNOSIS — G521 Disorders of glossopharyngeal nerve: Secondary | ICD-10-CM | POA: Diagnosis not present

## 2020-08-25 DIAGNOSIS — Z79899 Other long term (current) drug therapy: Secondary | ICD-10-CM | POA: Diagnosis not present

## 2020-08-25 DIAGNOSIS — Z923 Personal history of irradiation: Secondary | ICD-10-CM | POA: Diagnosis not present

## 2020-08-25 DIAGNOSIS — Z48811 Encounter for surgical aftercare following surgery on the nervous system: Secondary | ICD-10-CM | POA: Diagnosis not present

## 2020-08-28 ENCOUNTER — Telehealth: Payer: Self-pay | Admitting: *Deleted

## 2020-08-28 NOTE — Telephone Encounter (Signed)
Urine drug screen for this encounter is consistent for prescribed medication 

## 2020-08-30 ENCOUNTER — Encounter: Payer: Medicare Other | Attending: Physician Assistant | Admitting: *Deleted

## 2020-08-30 ENCOUNTER — Encounter: Payer: Self-pay | Admitting: *Deleted

## 2020-08-30 ENCOUNTER — Other Ambulatory Visit: Payer: Self-pay

## 2020-08-30 VITALS — BP 122/80 | Ht 63.0 in | Wt 233.4 lb

## 2020-08-30 DIAGNOSIS — E119 Type 2 diabetes mellitus without complications: Secondary | ICD-10-CM | POA: Insufficient documentation

## 2020-08-30 DIAGNOSIS — E7800 Pure hypercholesterolemia, unspecified: Secondary | ICD-10-CM | POA: Insufficient documentation

## 2020-08-30 NOTE — Patient Instructions (Addendum)
Check blood sugars 1 x day before breakfast or 2 hrs after supper - 3 x week Bring blood sugar records to the next /class  Exercise: Continue walking for 20-30  minutes  7 days a week  Eat 3 meals day,  1-2  snacks a day Space meals 4-6 hours apart Don't skip meals  Return for classes on:

## 2020-08-30 NOTE — Progress Notes (Signed)
Diabetes Self-Management Education  Visit Type: First/Initial  Appt. Start Time: 1405 Appt. End Time: 1525  08/30/2020  Karina Greer, identified by name and date of birth, is a 54 y.o. female with a diagnosis of Diabetes: Type 2.   ASSESSMENT  Blood pressure 122/80, height 5\' 3"  (1.6 m), weight 233 lb 6.4 oz (105.9 kg). Body mass index is 41.34 kg/m.   Diabetes Self-Management Education - 08/30/20 1610      Visit Information   Visit Type First/Initial      Initial Visit   Diabetes Type Type 2    Are you currently following a meal plan? No   "I have a hard time eating - I am never hungry"   Are you taking your medications as prescribed? Yes    Date Diagnosed 1 month ago      Health Coping   How would you rate your overall health? Poor      Psychosocial Assessment   Patient Belief/Attitude about Diabetes Defeat/Burnout   "not good"   Self-care barriers None    Self-management support Doctor's office    Patient Concerns Nutrition/Meal planning;Medication;Monitoring;Healthy Lifestyle;Problem Solving;Glycemic Control;Weight Control    Special Needs None    Preferred Learning Style Auditory;Visual;Hands on    Learning Readiness Ready    How often do you need to have someone help you when you read instructions, pamphlets, or other written materials from your doctor or pharmacy? 1 - Never    What is the last grade level you completed in school? 12th      Pre-Education Assessment   Patient understands the diabetes disease and treatment process. Needs Instruction    Patient understands incorporating nutritional management into lifestyle. Needs Instruction    Patient undertands incorporating physical activity into lifestyle. Needs Instruction    Patient understands using medications safely. Needs Instruction    Patient understands monitoring blood glucose, interpreting and using results Needs Instruction    Patient understands prevention, detection, and treatment of acute  complications. Needs Instruction    Patient understands prevention, detection, and treatment of chronic complications. Needs Instruction    Patient understands how to develop strategies to address psychosocial issues. Needs Instruction    Patient understands how to develop strategies to promote health/change behavior. Needs Instruction      Complications   Last HgB A1C per patient/outside source 6.5 %   08/07/2020   How often do you check your blood sugar? 0 times/day (not testing)   Patient brought her meter from home. Instructed on use of Accu-Chek Guide Me meter. BG upon return demonstration was 106 mg/dL at 2:50 pm - 3 hrs pp.   Have you had a dilated eye exam in the past 12 months? Yes    Have you had a dental exam in the past 12 months? Yes    Are you checking your feet? Yes    How many days per week are you checking your feet? 3      Dietary Intake   Breakfast sausage with mustard, sandwich thins; banana, crackers and pork-n-beans    Snack (morning) 0-1 snacks/day - cheese and meat sticks    Lunch skips    Dinner chicken, pork, beef, fish; peas, beans, corn, pasta, green beans, lettuce, tomatoes, cuccumbers, greens, zucchini, squash    Beverage(s) water      Exercise   Exercise Type Light (walking / raking leaves)    How many days per week to you exercise? 7    How many minutes per  day do you exercise? 25    Total minutes per week of exercise 175      Patient Education   Previous Diabetes Education No    Disease state  Definition of diabetes, type 1 and 2, and the diagnosis of diabetes;Factors that contribute to the development of diabetes    Nutrition management  Role of diet in the treatment of diabetes and the relationship between the three main macronutrients and blood glucose level;Food label reading, portion sizes and measuring food.;Reviewed blood glucose goals for pre and post meals and how to evaluate the patients' food intake on their blood glucose level.    Physical  activity and exercise  Role of exercise on diabetes management, blood pressure control and cardiac health.    Monitoring Taught/evaluated SMBG meter.;Purpose and frequency of SMBG.;Taught/discussed recording of test results and interpretation of SMBG.;Identified appropriate SMBG and/or A1C goals.    Chronic complications Relationship between chronic complications and blood glucose control    Psychosocial adjustment Role of stress on diabetes;Identified and addressed patients feelings and concerns about diabetes      Individualized Goals (developed by patient)   Reducing Risk Other (comment)   improve blood sugars, decrease medications, prevent diabetes complications, lose weight, lead a healthier lifestyle, become more fit     Outcomes   Expected Outcomes Demonstrated interest in learning. Expect positive outcomes    Future DMSE 4-6 wks           Individualized Plan for Diabetes Self-Management Training:   Learning Objective:  Patient will have a greater understanding of diabetes self-management. Patient education plan is to attend individual and/or group sessions per assessed needs and concerns.   Plan:   Patient Instructions  Check blood sugars 1 x day before breakfast or 2 hrs after supper - 3 x week Bring blood sugar records to the next /class  Exercise: Continue walking for 20-30  minutes  7 days a week  Eat 3 meals day,  1-2  snacks a day Space meals 4-6 hours apart Don't skip meals  Expected Outcomes:  Demonstrated interest in learning. Expect positive outcomes  Education material provided: General Meal Planning Guidelines Simple Meal Plan  If problems or questions, patient to contact team via:  Johny Drilling, RN, [place] [phone]  Future DSME appointment: 4-6 wks  September 28, 2020 for Diabetes Class 1

## 2020-08-31 ENCOUNTER — Other Ambulatory Visit: Payer: Self-pay | Admitting: Gastroenterology

## 2020-08-31 DIAGNOSIS — K580 Irritable bowel syndrome with diarrhea: Secondary | ICD-10-CM | POA: Diagnosis not present

## 2020-08-31 DIAGNOSIS — Z6841 Body Mass Index (BMI) 40.0 and over, adult: Secondary | ICD-10-CM | POA: Diagnosis not present

## 2020-08-31 DIAGNOSIS — R1314 Dysphagia, pharyngoesophageal phase: Secondary | ICD-10-CM

## 2020-08-31 DIAGNOSIS — R110 Nausea: Secondary | ICD-10-CM

## 2020-08-31 DIAGNOSIS — K2930 Chronic superficial gastritis without bleeding: Secondary | ICD-10-CM | POA: Diagnosis not present

## 2020-08-31 DIAGNOSIS — M797 Fibromyalgia: Secondary | ICD-10-CM | POA: Diagnosis not present

## 2020-08-31 DIAGNOSIS — E739 Lactose intolerance, unspecified: Secondary | ICD-10-CM | POA: Diagnosis not present

## 2020-09-11 ENCOUNTER — Encounter: Payer: Self-pay | Admitting: Registered Nurse

## 2020-09-11 ENCOUNTER — Encounter: Payer: Medicare Other | Attending: Physical Medicine & Rehabilitation | Admitting: Registered Nurse

## 2020-09-11 ENCOUNTER — Ambulatory Visit (INDEPENDENT_AMBULATORY_CARE_PROVIDER_SITE_OTHER): Payer: Medicare Other

## 2020-09-11 ENCOUNTER — Other Ambulatory Visit: Payer: Self-pay | Admitting: Registered Nurse

## 2020-09-11 ENCOUNTER — Other Ambulatory Visit: Payer: Self-pay

## 2020-09-11 VITALS — BP 145/92 | HR 72 | Temp 98.0°F | Ht 63.0 in | Wt 235.2 lb

## 2020-09-11 DIAGNOSIS — M7062 Trochanteric bursitis, left hip: Secondary | ICD-10-CM | POA: Diagnosis not present

## 2020-09-11 DIAGNOSIS — Z981 Arthrodesis status: Secondary | ICD-10-CM | POA: Diagnosis not present

## 2020-09-11 DIAGNOSIS — M542 Cervicalgia: Secondary | ICD-10-CM

## 2020-09-11 DIAGNOSIS — M62838 Other muscle spasm: Secondary | ICD-10-CM | POA: Diagnosis not present

## 2020-09-11 DIAGNOSIS — Z5181 Encounter for therapeutic drug level monitoring: Secondary | ICD-10-CM

## 2020-09-11 DIAGNOSIS — M47816 Spondylosis without myelopathy or radiculopathy, lumbar region: Secondary | ICD-10-CM | POA: Insufficient documentation

## 2020-09-11 DIAGNOSIS — J301 Allergic rhinitis due to pollen: Secondary | ICD-10-CM | POA: Diagnosis not present

## 2020-09-11 DIAGNOSIS — Z79899 Other long term (current) drug therapy: Secondary | ICD-10-CM | POA: Diagnosis not present

## 2020-09-11 DIAGNOSIS — G894 Chronic pain syndrome: Secondary | ICD-10-CM | POA: Diagnosis not present

## 2020-09-11 DIAGNOSIS — M7061 Trochanteric bursitis, right hip: Secondary | ICD-10-CM | POA: Insufficient documentation

## 2020-09-11 DIAGNOSIS — M797 Fibromyalgia: Secondary | ICD-10-CM | POA: Diagnosis not present

## 2020-09-11 MED ORDER — HYDROCODONE-ACETAMINOPHEN 10-325 MG PO TABS: ORAL_TABLET | ORAL | 0 refills | Status: DC

## 2020-09-11 MED FILL — HYDROCODON-APAP 10-325: 10-325 | 30 days supply | Qty: 90 | Fill #0

## 2020-09-11 NOTE — Progress Notes (Signed)
Subjective:    Patient ID: Karina Greer, female    DOB: [birth year], 54 y.o.   MRN: [id number]  HPI: Karina Greer is a 54 y.o. female who returns for follow up appointment for chronic pain and medication refill. She states her pain is located in her neck and lower back pain radiating into her left buttock and left lower extremity. She rates her pain 6. Her current exercise regime is walking and performing stretching exercises.  [name] Morphine equivalent is 30.00 MME.    Last UDS was Performed on 08/14/2020, it was consistent.    Pain Inventory Average Pain 10 Pain Right Now 6 My pain is constant, sharp and stabbing  In the last 24 hours, has pain interfered with the following? General activity 5 Relation with others 6 Enjoyment of life 4 What TIME of day is your pain at its worst? morning , daytime, evening and night Sleep (in general) Fair  Pain is worse with: walking, bending, sitting, inactivity, standing and unsure Pain improves with: rest, heat/ice, therapy/exercise, pacing activities, medication, TENS and injections Relief from Meds: 7  Family History  Problem Relation Age of Onset  . Hypertension Mother   . Diabetes Father   . Brain cancer Father   . Fibromyalgia Sister   . Suicidality Sister   . Hypertension Other        Cancer, Cerebrovascular disease run on mother side of family   Social History   Socioeconomic History  . Marital status: Divorced    Spouse name: Not on file  . Number of children: 3  . Years of education: HS  . Highest education level: 12th grade  Occupational History  . Occupation: Merchandiser, retail: UNEMPLOYED    Comment: Disability  Tobacco Use  . Smoking status: Never Smoker  . Smokeless tobacco: Never Used  Vaping Use  . Vaping Use: Never used  Substance and Sexual Activity  . Alcohol use: No  . Drug use: No  . Sexual activity: Not on file  Other Topics Concern  . Not on file  Social History Narrative    Patient is right-handed. She avoids caffeine. She has recently been using the treadmill.   Social Determinants of Health   Financial Resource Strain: Not on file  Food Insecurity: Not on file  Transportation Needs: Not on file  Physical Activity: Not on file  Stress: Not on file  Social Connections: Not on file   Past Surgical History:  Procedure Laterality Date  . ACHILLES TENDON SURGERY Right 3/16  . ANTERIOR CERVICAL DECOMP/DISCECTOMY FUSION N/A 03/11/2016   Procedure: C5-6, C6-7 Anterior Cervical Discectomy and Fusion, Allograft, Plate;  Surgeon: Marybelle Killings, MD;  Location: [place];  Service: Orthopedics;  Laterality: N/A;  . CHOLECYSTECTOMY  2014  . COLONOSCOPY WITH PROPOFOL N/A 08/25/2017   Procedure: COLONOSCOPY WITH PROPOFOL;  Surgeon: Manya Silvas, MD;  Location: [facility] ENDOSCOPY;  Service: Endoscopy;  Laterality: N/A;  . CYSTO WITH HYDRODISTENSION N/A 01/26/2014   Procedure: CYSTOSCOPY/HYDRODISTENSION;  Surgeon: Bernestine Amass, MD;  Location: [facility];  Service: Urology;  Laterality: N/A;  . CYSTO/  URETHRAL DILATION/  HYDRODISTENTION/   INSTILLATION THERAPY  07-16-2010//   12-30-2007//   10-27-2006  . EXERCISE TOLERENCE TEST  10-12-2010   NEGATIVE  ADEQUATE ETT/  NO ISCHEMIA OR EVIDENCE HIGH GRADE OBSTRUCTIVE CAD/  NO FURTHER TEST NEEDED  . [place] ENDOMETRIAL ABLATION  2014  . LAPAROSCOPIC OVARIAN CYST BX  2005   AND  URETEROSCOPIC LASER LITHO  STONE EXTRACTION  . SHOULDER OPEN ROTATOR CUFF REPAIR Right 2004  . TONSILLECTOMY    . TRANSTHORACIC ECHOCARDIOGRAM  06-07-2006   normal study/  ef 60-65%  . TUBAL LIGATION Bilateral 1995   Past Surgical History:  Procedure Laterality Date  . ACHILLES TENDON SURGERY Right 3/16  . ANTERIOR CERVICAL DECOMP/DISCECTOMY FUSION N/A 03/11/2016   Procedure: C5-6, C6-7 Anterior Cervical Discectomy and Fusion, Allograft, Plate;  Surgeon: Marybelle Killings, MD;  Location: [place];  Service: Orthopedics;   Laterality: N/A;  . CHOLECYSTECTOMY  2014  . COLONOSCOPY WITH PROPOFOL N/A 08/25/2017   Procedure: COLONOSCOPY WITH PROPOFOL;  Surgeon: Manya Silvas, MD;  Location: [facility] ENDOSCOPY;  Service: Endoscopy;  Laterality: N/A;  . CYSTO WITH HYDRODISTENSION N/A 01/26/2014   Procedure: CYSTOSCOPY/HYDRODISTENSION;  Surgeon: Bernestine Amass, MD;  Location: [facility];  Service: Urology;  Laterality: N/A;  . CYSTO/  URETHRAL DILATION/  HYDRODISTENTION/   INSTILLATION THERAPY  07-16-2010//   12-30-2007//   10-27-2006  . EXERCISE TOLERENCE TEST  10-12-2010   NEGATIVE  ADEQUATE ETT/  NO ISCHEMIA OR EVIDENCE HIGH GRADE OBSTRUCTIVE CAD/  NO FURTHER TEST NEEDED  . [place] ENDOMETRIAL ABLATION  2014  . LAPAROSCOPIC OVARIAN CYST BX  2005   AND  URETEROSCOPIC LASER LITHO  STONE EXTRACTION  . SHOULDER OPEN ROTATOR CUFF REPAIR Right 2004  . TONSILLECTOMY    . TRANSTHORACIC ECHOCARDIOGRAM  06-07-2006   normal study/  ef 60-65%  . TUBAL LIGATION Bilateral 1995   Past Medical History:  Diagnosis Date  . Anxiety   . Anxiety disorder   . Arthritis   . Blepharitis   . Chronic low back pain   . Depression   . Diabetes mellitus without complication ([place])   . Dyslipidemia   . Fibromyalgia   . GERD (gastroesophageal reflux disease)   . Headache    hx migraines  . History of kidney stones   . History of panic attacks   . History of renal calculi   . IBS (irritable bowel syndrome)   . Interstitial cystitis   . Lupus ([place])    tested positive for antibodies for lupus. dr to do further tests  . Menorrhagia   . OSA on CPAP    not used cpap for several weeks  . Pneumonia    hx  . PONV (postoperative nausea and vomiting)   . RLS (restless legs syndrome)   . Rosacea   . Seasonal asthma   . SI (sacroiliac) joint dysfunction   . SUI (stress urinary incontinence, female)   . UTI (lower urinary tract infection)    hx  . 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 02/23/2013   BP  (!) 145/92   Pulse 72   Temp 98 F (36.7 C)   Ht 5\' 3"  (1.6 m)   Wt 235 lb 3.2 oz (106.7 kg)   SpO2 97%   BMI 41.66 kg/m   Opioid Risk Score:   Fall Risk Score:  `1  Depression screen PHQ 2/9  Depression screen [facility] 2/9 09/11/2020 08/30/2020 08/14/2020 07/12/2020 04/14/2020 03/21/2020 03/17/2020  Decreased Interest 0 0 0 1 0 0 2  Down, Depressed, Hopeless 0 0 0 1 0 0 2  PHQ - 2 Score 0 0 0 2 0 0 4  Altered sleeping - - - - - 0 -  Tired, decreased energy - - - - - 0 -  Change in appetite - - - - -  0 -  Feeling bad or failure about yourself  - - - - - 0 -  Trouble concentrating - - - - - 0 -  Moving slowly or fidgety/restless - - - - - 0 -  Suicidal thoughts - - - - - 0 -  PHQ-9 Score - - - - - 0 -  Difficult doing work/chores - - - - - - -  Some recent data might be hidden   Review of Systems  Musculoskeletal: Positive for arthralgias.       Left hip pain Pain all over the body  Neurological: Positive for headaches.  All other systems reviewed and are negative.      Objective:   Physical Exam Vitals and nursing note reviewed.  Constitutional:      Appearance: Normal appearance. She is obese.  Neck:     Comments: Cervical Paraspinal Tenderness: C-5-C-6 Cardiovascular:     Rate and Rhythm: Normal rate and regular rhythm.     Pulses: Normal pulses.     Heart sounds: Normal heart sounds.  Pulmonary:     Effort: Pulmonary effort is normal.     Breath sounds: Normal breath sounds.  Musculoskeletal:     Cervical back: Normal range of motion and neck supple.     Comments: Normal Muscle Bulk and Muscle Testing Reveals:  Upper Extremities: Full ROM and Muscle Strength 5/5 Lumbar Paraspinal Tenderness: L-3-L-5 Left Greater Trochanter Tenderness  Lower Extremities: Right: Full ROM and Muscle Strength 5/5 Left Lower Extremity: Decreased ROM and Muscle Strength 5/5 Left Lower Extremity Flexion Produces Pain into her Left Hip and Left Lower Extremity Arises from chair  slowly Antalgic Gait  Skin:    General: Skin is warm and dry.  Neurological:     Mental Status: She is alert and oriented to person, place, and time.  Psychiatric:        Mood and Affect: Mood normal.        Behavior: Behavior normal.           Assessment & Plan:  1. Lumbago/ Lumbar Spondylosis/LeftLumbar Radiculitis: Continue to Monitor.09/11/2020. Refilled: HYDROcodone 10/325mg  one tablet every8hours as needed #90. We will continue the opioid monitoring program, this consists of regular clinic visits, examinations, urine drug screen, pill counts as well as use of [place] Controlled Substance Reporting system. A 12 month History has been reviewed on the [place] Controlled Substance Reporting Systemon12/20/2021. 2. Fibromyalgia. Continue Current exercise Regime.09/11/2020 3. Anxiety and depression:[facility]iatry [facility] Continue Counseling at [facility]. Ms. Ruscitti is in the process of obtaing counseling at another office12/20/2021 4. Migraines: On Max[place]. Neurology Following.09/11/2020 5. OSA :PCP Following. Continue to Monitor.09/11/2020 6. Obesity: Following Healthy Diet Regimenand Continue HEP as Tolerated.09/11/2020 7. Status Post Cervical Spinal Fusion: C5C6- C-6- C-7 Anterior Cervical Discectomy Fusion and Allograft Plate:Dr. Lorin Mercy Following.09/11/2020 8. Cervicalgia/ Cervical Radiculitis/ S/P Cervical Spinal Fusion:Ms. Devoss states she is awaiting on a surgery date, she will update the office when she has a surgical date.Continueto MonitorDr. Lorin Mercy Following.09/11/2020 9. Muscle Spasm: Continue Tizanidine as needed. 09/11/2020 10.Hereditary and Idiopathic Peripheral Neuropathy Continue with Tens Unit.09/11/2020. 11.BilateralGreater Trochanteric Bursitis:Continue to Alternate Ice andheat Therapy. Continue to Monitor.09/11/2020. 12. Chronic Bilateral Knee Pain:No Complaints Today.Continue  HEP as Tolerated. Continue to Monitor.09/11/2020 13. Lateral Epicondylitis of Right Elbow: Ortho Following. S/P 06/26/2020 Right lateral epicondyle debridement, drilling and repair  By Dr Lorin Mercy. Continue to monitor. 09/11/2020 14. Neuralgia/ Right Facial Droop: Neurology following. 15. Left Greater Trochanter Bursitis: Schedule with  Dr Ranell Patrick .Left-sided Trochanteric bursa injection with ultrasound guidance   F/U in 1 month

## 2020-09-12 ENCOUNTER — Ambulatory Visit: Payer: Medicare Other | Admitting: Orthopaedic Surgery

## 2020-09-14 ENCOUNTER — Other Ambulatory Visit: Payer: Self-pay

## 2020-09-14 ENCOUNTER — Ambulatory Visit
Admission: RE | Admit: 2020-09-14 | Discharge: 2020-09-14 | Disposition: A | Discharge status: Home / Self Care | Payer: Medicare Other | Source: Ambulatory Visit | Attending: Gastroenterology | Admitting: Gastroenterology

## 2020-09-14 DIAGNOSIS — K2930 Chronic superficial gastritis without bleeding: Secondary | ICD-10-CM | POA: Diagnosis not present

## 2020-09-14 DIAGNOSIS — R110 Nausea: Secondary | ICD-10-CM

## 2020-09-14 DIAGNOSIS — K219 Gastro-esophageal reflux disease without esophagitis: Secondary | ICD-10-CM | POA: Diagnosis not present

## 2020-09-14 DIAGNOSIS — R1314 Dysphagia, pharyngoesophageal phase: Secondary | ICD-10-CM

## 2020-09-19 DIAGNOSIS — J301 Allergic rhinitis due to pollen: Secondary | ICD-10-CM

## 2020-09-28 ENCOUNTER — Encounter: Payer: Medicare Other | Attending: Physician Assistant | Admitting: Dietician

## 2020-09-28 ENCOUNTER — Encounter: Payer: Self-pay | Admitting: Dietician

## 2020-09-28 ENCOUNTER — Other Ambulatory Visit: Payer: Self-pay

## 2020-09-28 VITALS — Ht 63.0 in | Wt 232.0 lb

## 2020-09-28 DIAGNOSIS — E119 Type 2 diabetes mellitus without complications: Secondary | ICD-10-CM | POA: Insufficient documentation

## 2020-09-28 NOTE — Progress Notes (Signed)
Appt. Start Time: 0900 Appt. End Time: 1130  Class 1 Diabetes Overview - define DM; state own type of DM; identify functions of pancreas and insulin; define insulin deficiency vs insulin resistance  Psychosocial - identify DM as a source of stress; state the effects of stress on BG control  Nutritional Management - describe effects of food on blood glucose; identify sources of carbohydrate, protein and fat; verbalize the importance of balance meals in controlling blood glucose  Exercise - describe the effects of exercise on blood glucose and importance of regular exercise in controlling diabetes; state a plan for personal exercise; verbalize contraindications for exercise  Self-Monitoring - state importance of SMBG; use SMBG results to effectively manage diabetes; identify importance of regular HbA1C testing and goals for results  Acute Complications - recognize hyperglycemia and hypoglycemia with causes and effects; identify blood glucose results as high, low or in control; list steps in treating and preventing high and low blood glucose  Chronic Complications/Foot, Skin, Eye Dental Care - identify possible long-term complications of diabetes (retinopathy, neuropathy, nephropathy, cardiovascular disease, infections); state importance of daily self-foot exams; describe how to examine feet and what to look for; explain appropriate eye and dental care  Lifestyle Changes/Goals & Health/Community Resources - state benefits of making appropriate lifestyle changes; identify habits that need to change (meals, tobacco, alcohol); identify strategies to reduce risk factors for personal health  Pregnancy/Sexual Health - define gestational diabetes; state importance of good blood glucose control and birth control prior to pregnancy; state importance of good blood glucose control in preventing sexual problems (impotence, vaginal dryness, infections, loss of desire); state relationship of blood glucose control  and pregnancy outcome; describe risk of maternal and fetal complications  Teaching Materials Used: Class 1 Slides/Notebook Diabetes Booklet ID Card  Medic Alert/Medic ID Forms Sleep Evaluation Exercise Handout Planning a Balanced Meal Goals for Class 1  

## 2020-10-05 ENCOUNTER — Encounter: Payer: Self-pay | Admitting: *Deleted

## 2020-10-05 ENCOUNTER — Encounter: Payer: Medicare Other | Admitting: *Deleted

## 2020-10-05 ENCOUNTER — Other Ambulatory Visit: Payer: Self-pay

## 2020-10-05 VITALS — Wt 232.6 lb

## 2020-10-05 DIAGNOSIS — E119 Type 2 diabetes mellitus without complications: Secondary | ICD-10-CM

## 2020-10-05 NOTE — Progress Notes (Signed)

## 2020-10-10 ENCOUNTER — Other Ambulatory Visit: Payer: Self-pay | Admitting: Registered Nurse

## 2020-10-10 ENCOUNTER — Telehealth: Payer: Self-pay | Admitting: Registered Nurse

## 2020-10-10 DIAGNOSIS — M797 Fibromyalgia: Secondary | ICD-10-CM

## 2020-10-10 MED ORDER — HYDROCODONE-ACETAMINOPHEN 10-325 MG PO TABS: ORAL_TABLET | ORAL | 0 refills | Status: DC

## 2020-10-10 MED FILL — HYDROCODON-APAP 10-325: 10-325 | 30 days supply | Qty: 90 | Fill #0

## 2020-10-10 NOTE — Telephone Encounter (Signed)
PMP was Reviewed: Hydrocodone e-scribed today. Karina Greer has a scheduled appointment t with Dr Ranell Patrick next week.

## 2020-10-11 ENCOUNTER — Ambulatory Visit: Payer: Medicare Other

## 2020-10-12 ENCOUNTER — Encounter: Payer: Medicare Other | Admitting: Dietician

## 2020-10-12 ENCOUNTER — Other Ambulatory Visit: Payer: Self-pay

## 2020-10-12 ENCOUNTER — Ambulatory Visit (INDEPENDENT_AMBULATORY_CARE_PROVIDER_SITE_OTHER): Payer: Medicare Other

## 2020-10-12 VITALS — BP 122/80 | Ht 63.0 in | Wt 229.7 lb

## 2020-10-12 DIAGNOSIS — E119 Type 2 diabetes mellitus without complications: Secondary | ICD-10-CM | POA: Diagnosis not present

## 2020-10-12 DIAGNOSIS — J301 Allergic rhinitis due to pollen: Secondary | ICD-10-CM

## 2020-10-12 NOTE — Progress Notes (Signed)
Appt. Start Time: 0900 Appt. End Time: 1150  Class 3 Diabetes Overview - identify functions of pancreas and insulin; define insulin deficiency vs insulin resistance  Medications - state name, dose, timing of currently prescribed medications; describe types of medications available for diabetes  Psychosocial - identify DM as a source of stress; state the effects of stress on BG control; verbalize appropriate stress management techniques; identify personal stress issues   Nutritional Management - use food labels to identify serving size, content of carbohydrate, fiber, protein, fat, saturated fat and sodium; recognize food sources of fat, saturated fat, trans fat, and sodium, and verbalize goals for intake; describe healthful, appropriate food choices when dining out   Exercise - state a plan for personal exercise; verbalize contraindications for exercise  Self-Monitoring - state importance of SMBG; use SMBG results to effectively manage diabetes; identify importance of regular HbA1C testing and goals for results  Acute Complications - recognize hyperglycemia and hypoglycemia with causes and effects; identify blood glucose results as high, low or in control; list steps in treating and preventing high and low blood glucose  Chronic Complications - state importance of daily self-foot exams; explain appropriate eye and dental care  Lifestyle Changes/Goals & Health/Community Resources - set goals for proper diabetes care; state need for and frequency of healthcare follow-up; describe appropriate community resources for good health (ADA, web sites, apps)   Teaching Materials Used: Class 3 Slide Packet Diabetes Stress Test Stress Management Tools Stress Poem Goal Setting Worksheet Website/App List

## 2020-10-14 ENCOUNTER — Other Ambulatory Visit: Payer: Self-pay | Admitting: Adult Health

## 2020-10-14 DIAGNOSIS — J301 Allergic rhinitis due to pollen: Secondary | ICD-10-CM

## 2020-10-17 ENCOUNTER — Encounter: Payer: Self-pay | Admitting: *Deleted

## 2020-10-18 ENCOUNTER — Other Ambulatory Visit: Payer: Self-pay | Admitting: Adult Health

## 2020-10-18 DIAGNOSIS — J301 Allergic rhinitis due to pollen: Secondary | ICD-10-CM

## 2020-10-19 ENCOUNTER — Encounter: Payer: Medicare Other | Attending: Physical Medicine & Rehabilitation | Admitting: Physical Medicine and Rehabilitation

## 2020-10-19 ENCOUNTER — Other Ambulatory Visit: Payer: Self-pay | Admitting: Physical Medicine and Rehabilitation

## 2020-10-19 ENCOUNTER — Encounter: Payer: Self-pay | Admitting: Physical Medicine and Rehabilitation

## 2020-10-19 ENCOUNTER — Other Ambulatory Visit: Payer: Self-pay

## 2020-10-19 VITALS — BP 148/87 | HR 86 | Temp 98.2°F | Ht 63.0 in | Wt 231.0 lb

## 2020-10-19 DIAGNOSIS — M797 Fibromyalgia: Secondary | ICD-10-CM

## 2020-10-19 DIAGNOSIS — M7062 Trochanteric bursitis, left hip: Secondary | ICD-10-CM | POA: Diagnosis not present

## 2020-10-19 MED ORDER — LIDOCAINE 5 % EX PTCH: 1.0000 | MEDICATED_PATCH | CUTANEOUS | 0 refills | Status: DC

## 2020-10-19 MED FILL — tiZANidine HCL 2 MG TABS: 2 | 16 days supply | Qty: 50 | Fill #1

## 2020-10-19 NOTE — Patient Instructions (Signed)
Roobois tea  Butterfly blue chamomile tea beauty sleep

## 2020-10-19 NOTE — Progress Notes (Signed)
Left Trochanteric bursa injection With ultrasound guidance  Indication Trochanteric bursitis. Exam has tenderness over the greater trochanter of the left hip. Pain has not responded to conservative care such as exercise therapy and oral medications. Pain interferes with sleep or with mobility Informed consent was obtained after describing risks and benefits of the procedure with the patient these include bleeding bruising and infection. Patient has signed written consent form. Patient placed in a lateral decubitus position with the affected hip superior. Point of maximal pain was palpated marked and prepped with Betadine and entered with a needle to bone contact. Needle slightly withdrawn then 1ML celestone with 2 cc 1% lidocaine were injected. Patient tolerated procedure well. Post procedure instructions given.

## 2020-10-20 MED FILL — LIDOCAINE PATCH 5%: 5 | 30 days supply | Qty: 30 | Fill #0

## 2020-10-24 DIAGNOSIS — I679 Cerebrovascular disease, unspecified: Secondary | ICD-10-CM | POA: Diagnosis not present

## 2020-10-24 DIAGNOSIS — Z6841 Body Mass Index (BMI) 40.0 and over, adult: Secondary | ICD-10-CM | POA: Diagnosis not present

## 2020-10-24 DIAGNOSIS — E119 Type 2 diabetes mellitus without complications: Secondary | ICD-10-CM | POA: Diagnosis not present

## 2020-10-25 ENCOUNTER — Encounter: Payer: Self-pay | Admitting: Orthopaedic Surgery

## 2020-10-25 ENCOUNTER — Other Ambulatory Visit: Payer: Self-pay

## 2020-10-25 ENCOUNTER — Ambulatory Visit (INDEPENDENT_AMBULATORY_CARE_PROVIDER_SITE_OTHER): Payer: Medicare Other | Admitting: Orthopaedic Surgery

## 2020-10-25 VITALS — BP 124/69 | HR 82 | Ht 63.0 in | Wt 231.0 lb

## 2020-10-25 DIAGNOSIS — M7711 Lateral epicondylitis, right elbow: Secondary | ICD-10-CM

## 2020-10-25 NOTE — Progress Notes (Signed)
Office Visit Note   Patient: Karina Greer           Date of Birth: 04-30-1966           MRN: [id number] Visit Date: 10/25/2020              Requested by: Cyndi Bender, PA-C [address] [place],  [place] [address] PCP: Cyndi Bender, PA-C   Assessment & Plan: Visit Diagnoses:  1. Lateral epicondylitis, right elbow     Plan: Patient given a wrist splint she can wear of the left wrist intermittently for left wrist symptoms.  She can continue using the wrist splint on the right on some intervals with activity.  She will try to avoid lifting heavy objects.  She will return if she has persistent problems.  Follow-Up Instructions: No follow-ups on file.   Orders:  No orders of the defined types were placed in this encounter.  No orders of the defined types were placed in this encounter.     Procedures: No procedures performed   Clinical Data: No additional findings.   Subjective: Chief Complaint  Patient presents with  . Right Elbow - Pain    06/26/2020 right lateral epicondyle debridement, drilling, and repair    HPI 55 year old female returns post right lateral condylar release drilling and repair on 06/26/2020.  Patient states she been using her left hand more for grabbing squeezing and now starting have left elbow pain.  She had a wrist splint for the right wrist she went back to using with some increased discomfort in her right elbow.  Since surgery 06/26/2020 she was doing well up until the last month and states if she does much squeezing she feels like it is really not much better since before surgery.  When she lifts water bottle sometimes it feels too heavy she describes tightness.  She denies associated neck pain with this.  No numbness or tingling in her fingers.  Review of Systems updated unchanged since surgery other than as mentioned above.   Objective: Vital Signs: BP 124/69   Pulse 82   Ht 5\' 3"  (1.6 m)   Wt 231 lb (104.8 kg)   BMI 40.92 kg/m    Physical Exam Constitutional:      Appearance: She is well-developed.  HENT:     Head: Normocephalic.     Right Ear: External ear normal.     Left Ear: External ear normal.  Eyes:     Pupils: Pupils are equal, round, and reactive to light.  Neck:     Thyroid: No thyromegaly.     Trachea: No tracheal deviation.  Cardiovascular:     Rate and Rhythm: Normal rate.  Pulmonary:     Effort: Pulmonary effort is normal.  Abdominal:     Palpations: Abdomen is soft.  Skin:    General: Skin is warm and dry.  Neurological:     Mental Status: She is alert and oriented to person, place, and time.  Psychiatric:        Mood and Affect: Mood and affect normal.        Behavior: Behavior normal.     Ortho Exam patient has right lateral epicondylar tenderness.  Well-healed scar.  No palpable defect between the tendon and the bone.  She takes fairly good resistance with wrist extension but complains of some pain at the lateral condyle.  Opposite left elbow is tender over the lateral epicondyle.  Pain with resisted wrist extension on the left  good cervical range of motion no brachial plexus tenderness. Specialty Comments:  No specialty comments available.  Imaging: No results found.   PMFS History: Patient Active Problem List   Diagnosis Date Noted  . Lateral epicondylitis, right elbow 05/25/2019  . Atherosclerosis of aorta ([place]) 10/28/2018  . Elevated glucose 10/28/2018  . Mixed hyperlipidemia 10/28/2018  . Hypothyroidism 07/20/2018  . LLQ pain 12/03/2017  . Pre-diabetes 06/03/2017  . Rectal bleeding 06/02/2017  . S/P cervical spinal fusion 03/11/2016  . Bilateral hand pain 11/29/2015  . Elevated C-reactive protein 11/29/2015  . Elevated rheumatoid factor 11/29/2015  . Raynaud's phenomenon without gangrene 11/29/2015  . Bloating 09/12/2014  . Dizziness 08/08/2014  . Peri-menopause 08/08/2014  . Chronic tension-type headache, intractable 07/13/2014  . Gastroesophageal reflux  disease without esophagitis 07/12/2014  . Anxiety 04/18/2014  . Chronic arthritis 04/18/2014  . Cystitis 04/18/2014  . Morbid obesity ([place]) 04/18/2014  . Fibromyalgia 04/18/2014  . 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 Arthropathy 04/18/2014  . Cephalalgia 04/04/2014  . Numbness and tingling 04/04/2014  . 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 Nonspecific abnormal findings on radiological and other examination of skull and head 02/23/2013  . Other nonspecific abnormal result of function study of brain and central nervous system 07/01/2012  . Disturbance of skin sensation 07/01/2012  . Migraine without aura 07/01/2012  . Abdominal pain 02/11/2012  . Chronic fatigue syndrome 02/11/2012  . Chronic interstitial cystitis 02/11/2012  . Dyspareunia 02/11/2012  . Dysuria 02/11/2012  . Female stress incontinence 02/11/2012  . Irritable bowel syndrome with diarrhea 02/11/2012  . Nausea and vomiting 02/11/2012  . Nocturia 02/11/2012  . Urge incontinence 02/11/2012  . Urinary urgency 02/11/2012  . Lumbar spondylosis 02/04/2012  . Myalgia and myositis 02/04/2012  . Depression 02/04/2012  . Lumbosacral spondylosis without myelopathy 02/04/2012  . OBESITY, UNSPECIFIED 10/03/2010  . GENERALIZED ANXIETY DISORDER 10/03/2010  . Chest pain 10/03/2010   Past Medical History:  Diagnosis Date  . Anxiety   . Anxiety disorder   . Arthritis   . Blepharitis   . Chronic low back pain   . Depression   . Diabetes mellitus without complication ([place])   . Dyslipidemia   . Fibromyalgia   . GERD (gastroesophageal reflux disease)   . Headache    hx migraines  . History of kidney stones   . History of panic attacks   . History of renal calculi   . IBS (irritable bowel syndrome)   . Interstitial cystitis   . Lupus ([place])    tested positive for antibodies for lupus. dr to do further tests  . Menorrhagia   . OSA on CPAP     not used cpap for several weeks  . Pneumonia    hx  . PONV (postoperative nausea and vomiting)   . RLS (restless legs syndrome)   . Rosacea   . Seasonal asthma   . SI (sacroiliac) joint dysfunction   . SUI (stress urinary incontinence, female)   . UTI (lower urinary tract infection)    hx  . 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 Hypertension Mother   . Diabetes Father   . Brain cancer Father   . Fibromyalgia Sister   . Suicidality Sister   . Hypertension Other        Cancer, Cerebrovascular disease run on mother side of family  Past Surgical History:  Procedure Laterality Date  . ACHILLES TENDON SURGERY Right 3/16  . ANTERIOR CERVICAL DECOMP/DISCECTOMY FUSION N/A 03/11/2016   Procedure: C5-6, C6-7 Anterior Cervical Discectomy and Fusion, Allograft, Plate;  Surgeon: Marybelle Killings, MD;  Location: [place];  Service: Orthopedics;  Laterality: N/A;  . CHOLECYSTECTOMY  2014  . COLONOSCOPY WITH PROPOFOL N/A 08/25/2017   Procedure: COLONOSCOPY WITH PROPOFOL;  Surgeon: Manya Silvas, MD;  Location[facility] ENDOSCOPY;  Service: Endoscopy;  Laterality: N/A;  . CYSTO WITH HYDRODISTENSION N/A 01/26/2014   Procedure: CYSTOSCOPY/HYDRODISTENSION;  Surgeon: Bernestine Amass, MD;  Location: [facility];  Service: Urology;  Laterality: N/A;  . CYSTO/  URETHRAL DILATION/  HYDRODISTENTION/   INSTILLATION THERAPY  07-16-2010//   12-30-2007//   10-27-2006  . EXERCISE TOLERENCE TEST  10-12-2010   NEGATIVE  ADEQUATE ETT/  NO ISCHEMIA OR EVIDENCE HIGH GRADE OBSTRUCTIVE CAD/  NO FURTHER TEST NEEDED  . [place] ENDOMETRIAL ABLATION  2014  . LAPAROSCOPIC OVARIAN CYST BX  2005   AND  URETEROSCOPIC LASER LITHO  STONE EXTRACTION  . SHOULDER OPEN ROTATOR CUFF REPAIR Right 2004  . TONSILLECTOMY    . TRANSTHORACIC ECHOCARDIOGRAM  06-07-2006   normal study/  ef 60-65%  . TUBAL LIGATION Bilateral 1995   Social History    Occupational History  . Occupation: Merchandiser, retail: UNEMPLOYED    Comment: Disability  Tobacco Use  . Smoking status: Never Smoker  . Smokeless tobacco: Never Used  Vaping Use  . Vaping Use: Never used  Substance and Sexual Activity  . Alcohol use: No  . Drug use: No  . Sexual activity: Not on file

## 2020-10-30 ENCOUNTER — Other Ambulatory Visit: Payer: Self-pay | Admitting: Registered Nurse

## 2020-10-30 ENCOUNTER — Encounter: Payer: Medicare Other | Admitting: Registered Nurse

## 2020-10-30 ENCOUNTER — Other Ambulatory Visit: Payer: Self-pay

## 2020-10-30 ENCOUNTER — Encounter: Payer: Medicare Other | Attending: Physical Medicine & Rehabilitation | Admitting: Registered Nurse

## 2020-10-30 ENCOUNTER — Encounter: Payer: Self-pay | Admitting: Registered Nurse

## 2020-10-30 VITALS — BP 112/81 | HR 87 | Temp 97.9°F | Wt 231.8 lb

## 2020-10-30 DIAGNOSIS — M7061 Trochanteric bursitis, right hip: Secondary | ICD-10-CM | POA: Diagnosis not present

## 2020-10-30 DIAGNOSIS — Z79899 Other long term (current) drug therapy: Secondary | ICD-10-CM

## 2020-10-30 DIAGNOSIS — G894 Chronic pain syndrome: Secondary | ICD-10-CM | POA: Insufficient documentation

## 2020-10-30 DIAGNOSIS — Z981 Arthrodesis status: Secondary | ICD-10-CM | POA: Insufficient documentation

## 2020-10-30 DIAGNOSIS — Z5181 Encounter for therapeutic drug level monitoring: Secondary | ICD-10-CM

## 2020-10-30 DIAGNOSIS — M62838 Other muscle spasm: Secondary | ICD-10-CM | POA: Diagnosis not present

## 2020-10-30 DIAGNOSIS — M542 Cervicalgia: Secondary | ICD-10-CM | POA: Insufficient documentation

## 2020-10-30 DIAGNOSIS — M2550 Pain in unspecified joint: Secondary | ICD-10-CM

## 2020-10-30 DIAGNOSIS — M7062 Trochanteric bursitis, left hip: Secondary | ICD-10-CM

## 2020-10-30 DIAGNOSIS — M797 Fibromyalgia: Secondary | ICD-10-CM | POA: Diagnosis not present

## 2020-10-30 DIAGNOSIS — M47816 Spondylosis without myelopathy or radiculopathy, lumbar region: Secondary | ICD-10-CM | POA: Insufficient documentation

## 2020-10-30 MED ORDER — HYDROCODONE-ACETAMINOPHEN 10-325 MG PO TABS: ORAL_TABLET | ORAL | 0 refills | Status: DC

## 2020-10-30 NOTE — Progress Notes (Signed)
Subjective:    Patient ID: Karina Greer, female    DOB: December 26, 1965, 55 y.o.   MRN: [id number]  HPI: Karina Greer is a 55 y.o. female who returns for follow up appointment for chronic pain and medication refill. She states her  pain is located in her neck, lower back, bilateral hips and reports generalized joint pain. She rates her pain 8. Her current exercise regime is walking and performing stretching exercises.  [name] Morphine equivalent is 30.00 MME.  She  is also prescribed Clonazepam by [facility]. We have discussed the black box warning of using opioids and benzodiazepines. I highlighted the dangers of using these drugs together and discussed the adverse events including respiratory suppression, overdose, cognitive impairment and importance of compliance with current regimen. We will continue to monitor and adjust as indicated.   Last UDS was Performed on 08/14/2020, it was consistent.    Pain Inventory Average Pain 9 Pain Right Now 8 My pain is constant, sharp, stabbing and aching  In the last 24 hours, has pain interfered with the following? General activity 6 Relation with others 6 Enjoyment of life 6 What TIME of day is your pain at its worst? morning , daytime, evening and night Sleep (in general) Fair  Pain is worse with: walking, bending, sitting, inactivity, standing and unsure Pain improves with: rest, heat/ice, therapy/exercise, pacing activities, medication, TENS and injections Relief from Meds: 7  Family History  Problem Relation Age of Onset  . Hypertension Mother   . Diabetes Father   . Brain cancer Father   . Fibromyalgia Sister   . Suicidality Sister   . Hypertension Other        Cancer, Cerebrovascular disease run on mother side of family   Social History   Socioeconomic History  . Marital status: Divorced    Spouse name: Not on file  . Number of children: 3  . Years of education: HS  . Highest education level: 12th grade   Occupational History  . Occupation: Merchandiser, retail: UNEMPLOYED    Comment: Disability  Tobacco Use  . Smoking status: Never Smoker  . Smokeless tobacco: Never Used  Vaping Use  . Vaping Use: Never used  Substance and Sexual Activity  . Alcohol use: No  . Drug use: No  . Sexual activity: Not on file  Other Topics Concern  . Not on file  Social History Narrative   Patient is right-handed. She avoids caffeine. She has recently been using the treadmill.   Social Determinants of Health   Financial Resource Strain: Not on file  Food Insecurity: Not on file  Transportation Needs: Not on file  Physical Activity: Not on file  Stress: Not on file  Social Connections: Not on file   Past Surgical History:  Procedure Laterality Date  . ACHILLES TENDON SURGERY Right 3/16  . ANTERIOR CERVICAL DECOMP/DISCECTOMY FUSION N/A 03/11/2016   Procedure: C5-6, C6-7 Anterior Cervical Discectomy and Fusion, Allograft, Plate;  Surgeon: Marybelle Killings, MD;  Location: [facility];  Service: Orthopedics;  Laterality: N/A;  . CHOLECYSTECTOMY  2014  . COLONOSCOPY WITH PROPOFOL N/A 08/25/2017   Procedure: COLONOSCOPY WITH PROPOFOL;  Surgeon: Manya Silvas, MD;  Location: [facility] ENDOSCOPY;  Service: Endoscopy;  Laterality: N/A;  . CYSTO WITH HYDRODISTENSION N/A 01/26/2014   Procedure: CYSTOSCOPY/HYDRODISTENSION;  Surgeon: Bernestine Amass, MD;  Location: [facility];  Service: Urology;  Laterality: N/A;  . CYSTO/  URETHRAL DILATION/  HYDRODISTENTION/  INSTILLATION THERAPY  07-16-2010//   12-30-2007//   10-27-2006  . EXERCISE TOLERENCE TEST  10-12-2010   NEGATIVE  ADEQUATE ETT/  NO ISCHEMIA OR EVIDENCE HIGH GRADE OBSTRUCTIVE CAD/  NO FURTHER TEST NEEDED  . [place] ENDOMETRIAL ABLATION  2014  . LAPAROSCOPIC OVARIAN CYST BX  2005   AND  URETEROSCOPIC LASER LITHO  STONE EXTRACTION  . SHOULDER OPEN ROTATOR CUFF REPAIR Right 2004  . TONSILLECTOMY    . TRANSTHORACIC ECHOCARDIOGRAM   06-07-2006   normal study/  ef 60-65%  . TUBAL LIGATION Bilateral 1995   Past Surgical History:  Procedure Laterality Date  . ACHILLES TENDON SURGERY Right 3/16  . ANTERIOR CERVICAL DECOMP/DISCECTOMY FUSION N/A 03/11/2016   Procedure: C5-6, C6-7 Anterior Cervical Discectomy and Fusion, Allograft, Plate;  Surgeon: Marybelle Killings, MD;  Location: [facility];  Service: Orthopedics;  Laterality: N/A;  . CHOLECYSTECTOMY  2014  . COLONOSCOPY WITH PROPOFOL N/A 08/25/2017   Procedure: COLONOSCOPY WITH PROPOFOL;  Surgeon: Manya Silvas, MD;  Location: [facility] ENDOSCOPY;  Service: Endoscopy;  Laterality: N/A;  . CYSTO WITH HYDRODISTENSION N/A 01/26/2014   Procedure: CYSTOSCOPY/HYDRODISTENSION;  Surgeon: Bernestine Amass, MD;  Location: [facility];  Service: Urology;  Laterality: N/A;  . CYSTO/  URETHRAL DILATION/  HYDRODISTENTION/   INSTILLATION THERAPY  07-16-2010//   12-30-2007//   10-27-2006  . EXERCISE TOLERENCE TEST  10-12-2010   NEGATIVE  ADEQUATE ETT/  NO ISCHEMIA OR EVIDENCE HIGH GRADE OBSTRUCTIVE CAD/  NO FURTHER TEST NEEDED  . [place] ENDOMETRIAL ABLATION  2014  . LAPAROSCOPIC OVARIAN CYST BX  2005   AND  URETEROSCOPIC LASER LITHO  STONE EXTRACTION  . SHOULDER OPEN ROTATOR CUFF REPAIR Right 2004  . TONSILLECTOMY    . TRANSTHORACIC ECHOCARDIOGRAM  06-07-2006   normal study/  ef 60-65%  . TUBAL LIGATION Bilateral 1995   Past Medical History:  Diagnosis Date  . Anxiety   . Anxiety disorder   . Arthritis   . Blepharitis   . Chronic low back pain   . Depression   . Diabetes mellitus without complication ([place])   . Dyslipidemia   . Fibromyalgia   . GERD (gastroesophageal reflux disease)   . Headache    hx migraines  . History of kidney stones   . History of panic attacks   . History of renal calculi   . IBS (irritable bowel syndrome)   . Interstitial cystitis   . Lupus ([place])    tested positive for antibodies for lupus. dr to do further tests  . Menorrhagia    . OSA on CPAP    not used cpap for several weeks  . Pneumonia    hx  . PONV (postoperative nausea and vomiting)   . RLS (restless legs syndrome)   . Rosacea   . Seasonal asthma   . SI (sacroiliac) joint dysfunction   . SUI (stress urinary incontinence, female)   . UTI (lower urinary tract infection)    hx  . 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 02/23/2013   BP 112/81   Pulse 87   Temp 97.9 F (36.6 C)   Wt 231 lb 12.8 oz (105.1 kg)   SpO2 97%   BMI 41.06 kg/m   Opioid Risk Score:   Fall Risk Score:  `1  Depression screen PHQ 2/9  Depression screen [facility] 2/9 09/11/2020 08/30/2020 08/14/2020 07/12/2020 04/14/2020 03/21/2020 03/17/2020  Decreased Interest 0 0 0 1 0 0 2  Down,  Depressed, Hopeless 0 0 0 1 0 0 2  PHQ - 2 Score 0 0 0 2 0 0 4  Altered sleeping - - - - - 0 -  Tired, decreased energy - - - - - 0 -  Change in appetite - - - - - 0 -  Feeling bad or failure about yourself  - - - - - 0 -  Trouble concentrating - - - - - 0 -  Moving slowly or fidgety/restless - - - - - 0 -  Suicidal thoughts - - - - - 0 -  PHQ-9 Score - - - - - 0 -  Difficult doing work/chores - - - - - - -  Some recent data might be hidden    Review of Systems  Musculoskeletal: Positive for arthralgias.  Neurological: Positive for headaches.  All other systems reviewed and are negative.      Objective:   Physical Exam Vitals and nursing note reviewed.  Constitutional:      Appearance: Normal appearance. She is obese.  Cardiovascular:     Rate and Rhythm: Normal rate and regular rhythm.     Pulses: Normal pulses.     Heart sounds: Normal heart sounds.  Pulmonary:     Effort: Pulmonary effort is normal.     Breath sounds: Normal breath sounds.  Musculoskeletal:     Cervical back: Normal range of motion and neck supple.     Comments: Normal Muscle Bulk and Muscle Testing Reveals:  Upper Extremities: Full ROM and Muscle Strength 5/5 Lumbar Paraspinal Tenderness: L-3-L-5 Bilateral  Greater Trochanter Tenderness Lower Extremities: Full ROM and Muscle Strength 5/5 Arises from chair with ease Narrow Based  Gait   Skin:    General: Skin is warm and dry.  Neurological:     Mental Status: She is alert and oriented to person, place, and time.  Psychiatric:        Mood and Affect: Mood normal.        Behavior: Behavior normal.           Assessment & Plan:  1. Lumbago/ Lumbar Spondylosis/LeftLumbar Radiculitis: Continue to Monitor.10/30/2020. Refilled: HYDROcodone 10/325mg  one tablet every8hours as needed #90. We will continue the opioid monitoring program, this consists of regular clinic visits, examinations, urine drug screen, pill counts as well as use of [place] Controlled Substance Reporting system. A 12 month History has been reviewed on the [place] Controlled Substance Reporting Systemon02/03/2021. 2. Fibromyalgia. Continue Current exercise Regime.10/30/2020 3. Anxiety and depression:Psychiatry Following[facility]. Continue Counseling at [facility].Ms. Seidenberg is in the process of obtaing counseling at another office02/03/2021 4. Migraines: On Maxalt. Neurology Following.10/30/2020 5. OSA :PCP Following. Continue to Monitor.10/30/2020 6. Obesity: Following Healthy Diet Regimenand Continue HEP as Tolerated.10/30/2020 7. Status Post Cervical Spinal Fusion: C5C6- C-6- C-7 Anterior Cervical Discectomy Fusion and Allograft Plate:Dr. Lorin Mercy Following.10/30/2020 8. Cervicalgia/ Cervical Radiculitis/ S/P Cervical Spinal Fusion:Continueto MonitorDr. Lorin Mercy Following.10/30/2020 9. Muscle Spasm: Continue Tizanidine as needed. 10/30/2020 10.Hereditary and Idiopathic Peripheral Neuropathy Continue with Tens Unit.10/30/2020. 11.BilateralGreater Trochanteric Bursitis:S/P Left Trochanteric Burs Injection on 10/19/2020, with good relief noted. Continue to Alternate Ice andheat Therapy. Continue to  Monitor.10/30/2020. 12. Chronic Bilateral Knee Pain:No Complaints Today.Continue HEP as Tolerated. Continue to Monitor.10/30/2020 13. Lateral Epicondylitis of Right Elbow: Ortho Following. S/P 06/26/2020 Right lateral epicondyle debridement, drilling and repair  By Dr Lorin Mercy.Continue to monitor. 10/30/2020 14. Neuralgia/ Right Facial Droop: Neurology following.Continue to monitor. 10/30/2020 F/U in 1 month

## 2020-11-03 ENCOUNTER — Ambulatory Visit: Payer: Medicare Other | Admitting: Registered Nurse

## 2020-11-08 MED FILL — HYDROCODON-APAP 10-325: 10-325 | 30 days supply | Qty: 90 | Fill #0

## 2020-11-09 ENCOUNTER — Other Ambulatory Visit: Payer: Self-pay | Admitting: Adult Health

## 2020-11-09 DIAGNOSIS — J301 Allergic rhinitis due to pollen: Secondary | ICD-10-CM

## 2020-11-15 ENCOUNTER — Ambulatory Visit: Payer: Medicare Other

## 2020-11-16 ENCOUNTER — Other Ambulatory Visit: Payer: Self-pay

## 2020-11-16 ENCOUNTER — Ambulatory Visit (INDEPENDENT_AMBULATORY_CARE_PROVIDER_SITE_OTHER): Payer: Medicare Other

## 2020-11-16 DIAGNOSIS — J301 Allergic rhinitis due to pollen: Secondary | ICD-10-CM

## 2020-11-20 DIAGNOSIS — M810 Age-related osteoporosis without current pathological fracture: Secondary | ICD-10-CM | POA: Diagnosis not present

## 2020-11-20 DIAGNOSIS — Z79899 Other long term (current) drug therapy: Secondary | ICD-10-CM | POA: Diagnosis not present

## 2020-11-20 DIAGNOSIS — E119 Type 2 diabetes mellitus without complications: Secondary | ICD-10-CM | POA: Diagnosis not present

## 2020-11-20 DIAGNOSIS — G521 Disorders of glossopharyngeal nerve: Secondary | ICD-10-CM | POA: Diagnosis not present

## 2020-11-20 DIAGNOSIS — E7800 Pure hypercholesterolemia, unspecified: Secondary | ICD-10-CM | POA: Diagnosis not present

## 2020-11-20 DIAGNOSIS — K219 Gastro-esophageal reflux disease without esophagitis: Secondary | ICD-10-CM | POA: Diagnosis not present

## 2020-11-20 DIAGNOSIS — R5383 Other fatigue: Secondary | ICD-10-CM | POA: Diagnosis not present

## 2020-11-20 DIAGNOSIS — N951 Menopausal and female climacteric states: Secondary | ICD-10-CM | POA: Diagnosis not present

## 2020-11-20 DIAGNOSIS — M5481 Occipital neuralgia: Secondary | ICD-10-CM | POA: Diagnosis not present

## 2020-11-20 DIAGNOSIS — E559 Vitamin D deficiency, unspecified: Secondary | ICD-10-CM | POA: Diagnosis not present

## 2020-11-20 DIAGNOSIS — G43009 Migraine without aura, not intractable, without status migrainosus: Secondary | ICD-10-CM | POA: Diagnosis not present

## 2020-11-20 DIAGNOSIS — Z6839 Body mass index (BMI) 39.0-39.9, adult: Secondary | ICD-10-CM | POA: Diagnosis not present

## 2020-11-21 DIAGNOSIS — G521 Disorders of glossopharyngeal nerve: Secondary | ICD-10-CM | POA: Diagnosis not present

## 2020-11-21 DIAGNOSIS — Z8669 Personal history of other diseases of the nervous system and sense organs: Secondary | ICD-10-CM | POA: Insufficient documentation

## 2020-11-21 DIAGNOSIS — R519 Headache, unspecified: Secondary | ICD-10-CM | POA: Diagnosis not present

## 2020-11-21 DIAGNOSIS — F411 Generalized anxiety disorder: Secondary | ICD-10-CM | POA: Diagnosis not present

## 2020-11-21 DIAGNOSIS — R4586 Emotional lability: Secondary | ICD-10-CM | POA: Diagnosis not present

## 2020-11-23 ENCOUNTER — Ambulatory Visit (INDEPENDENT_AMBULATORY_CARE_PROVIDER_SITE_OTHER): Payer: Medicare Other | Admitting: Internal Medicine

## 2020-11-23 ENCOUNTER — Encounter: Payer: Self-pay | Admitting: Hospice and Palliative Medicine

## 2020-11-23 VITALS — BP 138/92 | HR 75 | Temp 97.3°F | Resp 16 | Ht 63.0 in | Wt 227.6 lb

## 2020-11-23 DIAGNOSIS — R0602 Shortness of breath: Secondary | ICD-10-CM

## 2020-11-23 DIAGNOSIS — H1010 Acute atopic conjunctivitis, unspecified eye: Secondary | ICD-10-CM

## 2020-11-23 DIAGNOSIS — J4540 Moderate persistent asthma, uncomplicated: Secondary | ICD-10-CM

## 2020-11-23 DIAGNOSIS — Z6841 Body Mass Index (BMI) 40.0 and over, adult: Secondary | ICD-10-CM

## 2020-11-23 DIAGNOSIS — J309 Allergic rhinitis, unspecified: Secondary | ICD-10-CM

## 2020-11-23 MED ORDER — ALBUTEROL SULFATE HFA 108 (90 BASE) MCG/ACT IN AERS: INHALATION_SPRAY | RESPIRATORY_TRACT | 6 refills | Status: DC

## 2020-11-23 MED ORDER — FLOVENT HFA 110 MCG/ACT IN AERO: INHALATION_SPRAY | RESPIRATORY_TRACT | 5 refills | Status: DC

## 2020-11-23 NOTE — Progress Notes (Signed)
[facility] [place], [place] [address]  Internal MEDICINE  Office Visit Note  Patient Name: Karina Greer  [id number]  [id number]  Date of Service: 11/29/2020  Chief Complaint  Patient presents with  . Follow-up    Discuss meds, refills, wants to practice deep breathing, allergy injections     HPI Pt is here for pulmonary follow up, she noticed that her allergies have been somewhat worse. She will like to know if she can be re-tested. She is taking immunotherapy once a month now. Occasional cough but otherwise ok. Sleeps well but does snore     Current Medication: Outpatient Encounter Medications as of 11/23/2020  Medication Sig Note  . albuterol (PROAIR HFA) 108 (90 Base) MCG/ACT inhaler INHALE 2 PUFFS BY MOUTH EVERY 4 TO 6 HOURS AS NEEDED   . azelastine (ASTELIN) 0.1 % nasal spray USE 2 SPRAYS INTO EACH NOSTRIL TWICE A DAY 06/30/2019: Prn   . b complex vitamins tablet Take 1 tablet by mouth daily.   . calcitonin, salmon, (MIACALCIN/FORTICAL) 200 UNIT/ACT nasal spray Place 1 spray into alternate nostrils daily.    . chlorhexidine (PERIDEX) 0.12 % solution SMARTSIG:By Mouth   . Cholecalciferol (VITAMIN D) 50 MCG (2000 UT) CAPS Take 1 capsule by mouth once a week.   . clonazePAM (KLONOPIN) 0.5 MG tablet Take 0.25 mg by mouth 2 (two) times daily.   [name] DEXILANT 60 MG capsule Take 1 capsule by mouth daily as needed.   . dicyclomine (BENTYL) 10 MG capsule Take 10 mg by mouth 2 (two) times daily as needed.   [name] EPINEPHrine (EPIPEN 2-PAK) 0.3 mg/0.3 mL IJ SOAJ injection Use as directed   . ezetimibe (ZETIA) 10 MG tablet    . fexofenadine (ALLEGRA) 180 MG tablet Take 180 mg by mouth daily.   . fluticasone (FLOVENT HFA) 110 MCG/ACT inhaler INHALE 1 PUFF BY MOUTH TWICE A DAY   . HYDROcodone-acetaminophen (NORCO) 10-325 MG tablet TAKE 1 TABLET BY MOUTH EVERY 8 HOURS AS NEEDED FOR PAIN.   [name] ketoconazole (NIZORAL) 2 % shampoo Apply 1 application topically 2 (two) times a  week.    . lidocaine (LIDODERM) 5 % Place 1 patch onto the skin daily. Remove & Discard patch within 12 hours or as directed by MD   . meclizine (ANTIVERT) 25 MG tablet Take 1 tablet by mouth 3 (three) times daily as needed.   . ondansetron (ZOFRAN-ODT) 8 MG disintegrating tablet Take 8 mg by mouth 3 (three) times daily.   . pramipexole (MIRAPEX) 0.25 MG tablet Take by mouth.   . prednisoLONE acetate (PRED FORTE) 1 % ophthalmic suspension Place 1 drop into the right eye in the morning and at bedtime.   . pregabalin (LYRICA) 50 MG capsule Take 50 mg by mouth 2 (two) times daily.   [name] PRESCRIPTION MEDICATION every 14 (fourteen) days. 2 ALLERGY INJECTIONS EVERY 2 WKS   . rosuvastatin (CRESTOR) 10 MG tablet Take 10 mg by mouth at bedtime.   [name] SPIRULINA PO Take 1 capsule by mouth daily.   [name] tiZANidine (ZANAFLEX) 2 MG tablet Take 1 tablet (2 mg total) by mouth every 8 (eight) hours as needed.   . valACYclovir (VALTREX) 1000 MG tablet Take 2,000 mg by mouth 2 (two) times daily as needed.   . [DISCONTINUED] albuterol (PROVENTIL) (2.5 MG/3ML) 0.083% nebulizer solution Take 3 mLs (2.5 mg total) by nebulization every 6 (six) hours as needed for wheezing or shortness of breath.   . [DISCONTINUED] FLOVENT  HFA 110 MCG/ACT inhaler INHALE 1 PUFF BY MOUTH TWICE A DAY   . [DISCONTINUED] PROAIR HFA 108 (90 Base) MCG/ACT inhaler INHALE 2 PUFFS BY MOUTH EVERY 4 TO 6 HOURS AS NEEDED    No facility-administered encounter medications on file as of 11/23/2020.    Surgical History: Past Surgical History:  Procedure Laterality Date  . ACHILLES TENDON SURGERY Right 3/16  . ANTERIOR CERVICAL DECOMP/DISCECTOMY FUSION N/A 03/11/2016   Procedure: C5-6, C6-7 Anterior Cervical Discectomy and Fusion, Allograft, Plate;  Surgeon: Marybelle Killings, MD;  Location: [facility];  Service: Orthopedics;  Laterality: N/A;  . CHOLECYSTECTOMY  2014  . COLONOSCOPY WITH PROPOFOL N/A 08/25/2017   Procedure: COLONOSCOPY WITH PROPOFOL;  Surgeon: Manya Silvas, MD;  Location: [facility] ENDOSCOPY;  Service: Endoscopy;  Laterality: N/A;  . CYSTO WITH HYDRODISTENSION N/A 01/26/2014   Procedure: CYSTOSCOPY/HYDRODISTENSION;  Surgeon: Bernestine Amass, MD;  Location: [facility];  Service: Urology;  Laterality: N/A;  . CYSTO/  URETHRAL DILATION/  HYDRODISTENTION/   INSTILLATION THERAPY  07-16-2010//   12-30-2007//   10-27-2006  . ELBOW SURGERY Right   . EXERCISE TOLERENCE TEST  10-12-2010   NEGATIVE  ADEQUATE ETT/  NO ISCHEMIA OR EVIDENCE HIGH GRADE OBSTRUCTIVE CAD/  NO FURTHER TEST NEEDED  . [place] ENDOMETRIAL ABLATION  2014  . LAPAROSCOPIC OVARIAN CYST BX  2005   AND  URETEROSCOPIC LASER LITHO  STONE EXTRACTION  . SHOULDER OPEN ROTATOR CUFF REPAIR Right 2004  . TONSILLECTOMY    . TRANSTHORACIC ECHOCARDIOGRAM  06-07-2006   normal study/  ef 60-65%  . TUBAL LIGATION Bilateral 1995    Medical History: Past Medical History:  Diagnosis Date  . Anxiety   . Anxiety disorder   . Arthritis   . Blepharitis   . Chronic low back pain   . Depression   . Diabetes mellitus without complication ([place])   . Dyslipidemia   . Fibromyalgia   . GERD (gastroesophageal reflux disease)   . Headache    hx migraines  . History of kidney stones   . History of panic attacks   . History of renal calculi   . IBS (irritable bowel syndrome)   . Interstitial cystitis   . Lupus ([place])    tested positive for antibodies for lupus. dr to do further tests  . Menorrhagia   . OSA on CPAP    not used cpap for several weeks  . Pneumonia    hx  . PONV (postoperative nausea and vomiting)   . RLS (restless legs syndrome)   . Rosacea   . Seasonal asthma   . SI (sacroiliac) joint dysfunction   . SUI (stress urinary incontinence, female)   . UTI (lower urinary tract infection)    hx  . 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 02/23/2013    Family History: Family History  Problem Relation Age of Onset  . Hypertension Mother   . Diabetes  Father   . Brain cancer Father   . Fibromyalgia Sister   . Suicidality Sister   . Hypertension Other        Cancer, Cerebrovascular disease run on mother side of family    Social History   Socioeconomic History  . Marital status: Divorced    Spouse name: Not on file  . Number of children: 3  . Years of education: HS  . Highest education level: 12th grade  Occupational History  . Occupation: Merchandiser, retail: UNEMPLOYED  Comment: Disability  Tobacco Use  . Smoking status: Never Smoker  . Smokeless tobacco: Never Used  Vaping Use  . Vaping Use: Never used  Substance and Sexual Activity  . Alcohol use: No  . Drug use: No  . Sexual activity: Not on file  Other Topics Concern  . Not on file  Social History Narrative   Patient is right-handed. She avoids caffeine. She has recently been using the treadmill.   Social Determinants of Health   Financial Resource Strain: Not on file  Food Insecurity: Not on file  Transportation Needs: Not on file  Physical Activity: Not on file  Stress: Not on file  Social Connections: Not on file  Intimate Partner Violence: Not on file      Review of Systems  Constitutional: Negative for chills, diaphoresis and fatigue.  HENT: Positive for postnasal drip. Negative for ear pain and sinus pressure.   Eyes: Negative for photophobia, discharge, redness, itching and visual disturbance.  Respiratory: Positive for cough. Negative for shortness of breath and wheezing.   Cardiovascular: Negative for chest pain, palpitations and leg swelling.  Genitourinary: Negative for flank pain.  Skin: Negative for color change.  Allergic/Immunologic: Negative for environmental allergies and food allergies.  Neurological: Negative for dizziness and headaches.  Psychiatric/Behavioral: Negative for agitation, behavioral problems (depression) and hallucinations.    Vital Signs: BP (!) 138/92   Pulse 75   Temp (!) 97.3 F (36.3 C)   Resp 16   Ht  5\' 3"  (1.6 m)   Wt 227 lb 9.6 oz (103.2 kg)   SpO2 97%   BMI 40.32 kg/m    Physical Exam Constitutional:      Appearance: She is obese.  HENT:     Head: Atraumatic.     Right Ear: Tympanic membrane normal.     Left Ear: Tympanic membrane normal.  Eyes:     Extraocular Movements: Extraocular movements intact.     Pupils: Pupils are equal, round, and reactive to light.  Cardiovascular:     Rate and Rhythm: Regular rhythm.     Heart sounds: Normal heart sounds.  Pulmonary:     Effort: Pulmonary effort is normal.     Breath sounds: Wheezing present.  Musculoskeletal:     Right lower leg: No edema.     Left lower leg: No edema.  Skin:    General: Skin is warm and dry.        Assessment/Plan: 1. Moderate persistent asthma without complication - Spirometry with Graph, normal, might need to continue on ICS and SABA  2. Allergic rhinoconjunctivitis Continue Immunotherapy, might need to be retested   3. BMI 40.0-44.9, adult ([place]) Pt does have symptoms of OSA. Might need to be evaluated for OSA in future, instructed to monitor blood pressure at home as well   General Counseling: Kripa verbalizes understanding of the findings of todays visit and agrees with plan of treatment. I have discussed any further diagnostic evaluation that may be needed or ordered today. We also reviewed her medications today. she has been encouraged to call the office with any questions or concerns that should arise related to todays visit.    Orders Placed This Encounter  Procedures  . Spirometry with Graph    Meds ordered this encounter  Medications  . albuterol (PROAIR HFA) 108 (90 Base) MCG/ACT inhaler    Sig: INHALE 2 PUFFS BY MOUTH EVERY 4 TO 6 HOURS AS NEEDED    Dispense:  8.5 each  Refill:  6  . fluticasone (FLOVENT HFA) 110 MCG/ACT inhaler    Sig: INHALE 1 PUFF BY MOUTH TWICE A DAY    Dispense:  12 each    Refill:  5    Total time spent:30 Minutes Time spent includes review  of chart, medications, test results, and follow up plan with the patient.   [place] Controlled Substance Database was reviewed by me.   Dr Lavera Guise Internal medicine

## 2020-11-27 DIAGNOSIS — L820 Inflamed seborrheic keratosis: Secondary | ICD-10-CM | POA: Diagnosis not present

## 2020-11-27 DIAGNOSIS — L918 Other hypertrophic disorders of the skin: Secondary | ICD-10-CM | POA: Diagnosis not present

## 2020-12-04 ENCOUNTER — Encounter: Payer: Medicare Other | Attending: Physical Medicine & Rehabilitation | Admitting: Registered Nurse

## 2020-12-04 ENCOUNTER — Other Ambulatory Visit: Payer: Self-pay | Admitting: Registered Nurse

## 2020-12-04 ENCOUNTER — Encounter: Payer: Self-pay | Admitting: Registered Nurse

## 2020-12-04 ENCOUNTER — Other Ambulatory Visit: Payer: Self-pay

## 2020-12-04 VITALS — BP 133/82 | HR 74 | Temp 98.3°F | Ht 63.0 in | Wt 229.0 lb

## 2020-12-04 DIAGNOSIS — M62838 Other muscle spasm: Secondary | ICD-10-CM

## 2020-12-04 DIAGNOSIS — M797 Fibromyalgia: Secondary | ICD-10-CM | POA: Diagnosis not present

## 2020-12-04 DIAGNOSIS — M542 Cervicalgia: Secondary | ICD-10-CM | POA: Diagnosis not present

## 2020-12-04 DIAGNOSIS — Z5181 Encounter for therapeutic drug level monitoring: Secondary | ICD-10-CM

## 2020-12-04 DIAGNOSIS — M2550 Pain in unspecified joint: Secondary | ICD-10-CM

## 2020-12-04 DIAGNOSIS — Z79899 Other long term (current) drug therapy: Secondary | ICD-10-CM

## 2020-12-04 DIAGNOSIS — G894 Chronic pain syndrome: Secondary | ICD-10-CM | POA: Diagnosis not present

## 2020-12-04 DIAGNOSIS — M47816 Spondylosis without myelopathy or radiculopathy, lumbar region: Secondary | ICD-10-CM | POA: Diagnosis not present

## 2020-12-04 DIAGNOSIS — Z981 Arthrodesis status: Secondary | ICD-10-CM | POA: Diagnosis not present

## 2020-12-04 DIAGNOSIS — M7062 Trochanteric bursitis, left hip: Secondary | ICD-10-CM | POA: Insufficient documentation

## 2020-12-04 DIAGNOSIS — M7061 Trochanteric bursitis, right hip: Secondary | ICD-10-CM | POA: Diagnosis not present

## 2020-12-04 MED ORDER — HYDROCODONE-ACETAMINOPHEN 10-325 MG PO TABS: ORAL_TABLET | ORAL | 0 refills | Status: DC

## 2020-12-04 NOTE — Progress Notes (Signed)
Subjective:    Patient ID: Karina Greer, female    DOB: 03/31/1966, 55 y.o.   MRN: [id number]  HPI: Karina Greer is a 55 y.o. female who returns for follow up appointment for chronic pain and medication refill. She states her pain is located in her neck, lower back, bilateral hips and reports generalized pain all over. She rates her pain 6. Her current exercise regime is walking and performing stretching exercises.  Ms. Rought states " last month when she awaken it felt as though her body was frozen and she had difficulty moving, she also reports this occured 5 times last month. She didn't seek medical attention. She will call her neurologist she states. At this time no symptoms of the above noted. We will continue to monitor.   Ms. [place] Morphine equivalent is 30.00 MME.  UDS ordered today.  Pain Inventory Average Pain 7 Pain Right Now 6 My pain is constant, stabbing, tingling and aching  In the last 24 hours, has pain interfered with the following? General activity 6 Relation with others 6 Enjoyment of life 6 What TIME of day is your pain at its worst? morning , daytime, evening and night Sleep (in general) Fair  Pain is worse with: walking, bending, sitting, inactivity, standing and some activites Pain improves with: medication Relief from Meds: 7  Family History  Problem Relation Age of Onset  . Hypertension Mother   . Diabetes Father   . Brain cancer Father   . Fibromyalgia Sister   . Suicidality Sister   . Hypertension Other        Cancer, Cerebrovascular disease run on mother side of family   Social History   Socioeconomic History  . Marital status: Divorced    Spouse name: Not on file  . Number of children: 3  . Years of education: HS  . Highest education level: 12th grade  Occupational History  . Occupation: Merchandiser, retail: UNEMPLOYED    Comment: Disability  Tobacco Use  . Smoking status: Never Smoker  . Smokeless tobacco: Never Used  Vaping  Use  . Vaping Use: Never used  Substance and Sexual Activity  . Alcohol use: No  . Drug use: No  . Sexual activity: Not on file  Other Topics Concern  . Not on file  Social History Narrative   Patient is right-handed. She avoids caffeine. She has recently been using the treadmill.   Social Determinants of Health   Financial Resource Strain: Not on file  Food Insecurity: Not on file  Transportation Needs: Not on file  Physical Activity: Not on file  Stress: Not on file  Social Connections: Not on file   Past Surgical History:  Procedure Laterality Date  . ACHILLES TENDON SURGERY Right 3/16  . ANTERIOR CERVICAL DECOMP/DISCECTOMY FUSION N/A 03/11/2016   Procedure: C5-6, C6-7 Anterior Cervical Discectomy and Fusion, Allograft, Plate;  Surgeon: Marybelle Killings, MD;  Location: [place];  Service: Orthopedics;  Laterality: N/A;  . CHOLECYSTECTOMY  2014  . COLONOSCOPY WITH PROPOFOL N/A 08/25/2017   Procedure: COLONOSCOPY WITH PROPOFOL;  Surgeon: Manya Silvas, MD;  Location: [facility] ENDOSCOPY;  Service: Endoscopy;  Laterality: N/A;  . CYSTO WITH HYDRODISTENSION N/A 01/26/2014   Procedure: CYSTOSCOPY/HYDRODISTENSION;  Surgeon: Bernestine Amass, MD;  Location: [facility];  Service: Urology;  Laterality: N/A;  . CYSTO/  URETHRAL DILATION/  HYDRODISTENTION/   INSTILLATION THERAPY  07-16-2010//   12-30-2007//   10-27-2006  . ELBOW SURGERY  Right   . EXERCISE TOLERENCE TEST  10-12-2010   NEGATIVE  ADEQUATE ETT/  NO ISCHEMIA OR EVIDENCE HIGH GRADE OBSTRUCTIVE CAD/  NO FURTHER TEST NEEDED  . [place] ENDOMETRIAL ABLATION  2014  . LAPAROSCOPIC OVARIAN CYST BX  2005   AND  URETEROSCOPIC LASER LITHO  STONE EXTRACTION  . SHOULDER OPEN ROTATOR CUFF REPAIR Right 2004  . TONSILLECTOMY    . TRANSTHORACIC ECHOCARDIOGRAM  06-07-2006   normal study/  ef 60-65%  . TUBAL LIGATION Bilateral 1995   Past Surgical History:  Procedure Laterality Date  . ACHILLES TENDON SURGERY Right  3/16  . ANTERIOR CERVICAL DECOMP/DISCECTOMY FUSION N/A 03/11/2016   Procedure: C5-6, C6-7 Anterior Cervical Discectomy and Fusion, Allograft, Plate;  Surgeon: Marybelle Killings, MD;  Location: [place];  Service: Orthopedics;  Laterality: N/A;  . CHOLECYSTECTOMY  2014  . COLONOSCOPY WITH PROPOFOL N/A 08/25/2017   Procedure: COLONOSCOPY WITH PROPOFOL;  Surgeon: Manya Silvas, MD;  Location[facility] ENDOSCOPY;  Service: Endoscopy;  Laterality: N/A;  . CYSTO WITH HYDRODISTENSION N/A 01/26/2014   Procedure: CYSTOSCOPY/HYDRODISTENSION;  Surgeon: Bernestine Amass, MD;  Location: [facility];  Service: Urology;  Laterality: N/A;  . CYSTO/  URETHRAL DILATION/  HYDRODISTENTION/   INSTILLATION THERAPY  07-16-2010//   12-30-2007//   10-27-2006  . ELBOW SURGERY Right   . EXERCISE TOLERENCE TEST  10-12-2010   NEGATIVE  ADEQUATE ETT/  NO ISCHEMIA OR EVIDENCE HIGH GRADE OBSTRUCTIVE CAD/  NO FURTHER TEST NEEDED  . [place] ENDOMETRIAL ABLATION  2014  . LAPAROSCOPIC OVARIAN CYST BX  2005   AND  URETEROSCOPIC LASER LITHO  STONE EXTRACTION  . SHOULDER OPEN ROTATOR CUFF REPAIR Right 2004  . TONSILLECTOMY    . TRANSTHORACIC ECHOCARDIOGRAM  06-07-2006   normal study/  ef 60-65%  . TUBAL LIGATION Bilateral 1995   Past Medical History:  Diagnosis Date  . Anxiety   . Anxiety disorder   . Arthritis   . Blepharitis   . Chronic low back pain   . Depression   . Diabetes mellitus without complication ([place])   . Dyslipidemia   . Fibromyalgia   . GERD (gastroesophageal reflux disease)   . Headache    hx migraines  . History of kidney stones   . History of panic attacks   . History of renal calculi   . IBS (irritable bowel syndrome)   . Interstitial cystitis   . Lupus ([place])    tested positive for antibodies for lupus. dr to do further tests  . Menorrhagia   . OSA on CPAP    not used cpap for several weeks  . Pneumonia    hx  . PONV (postoperative nausea and vomiting)   . RLS  (restless legs syndrome)   . Rosacea   . Seasonal asthma   . SI (sacroiliac) joint dysfunction   . SUI (stress urinary incontinence, female)   . UTI (lower urinary tract infection)    hx  . 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 02/23/2013   BP 133/82   Pulse 74   Temp 98.3 F (36.8 C)   Ht 5\' 3"  (1.6 m)   Wt 229 lb (103.9 kg)   SpO2 98%   BMI 40.57 kg/m   Opioid Risk Score:   Fall Risk Score:  `1  Depression screen PHQ 2/9  Depression screen [facility] 2/9 12/04/2020 11/23/2020 09/11/2020 08/30/2020 08/14/2020 07/12/2020 04/14/2020  Decreased Interest 0 0 0 0 0 1  0  Down, Depressed, Hopeless 0 0 0 0 0 1 0  PHQ - 2 Score 0 0 0 0 0 2 0  Altered sleeping - - - - - - -  Tired, decreased energy - - - - - - -  Change in appetite - - - - - - -  Feeling bad or failure about yourself  - - - - - - -  Trouble concentrating - - - - - - -  Moving slowly or fidgety/restless - - - - - - -  Suicidal thoughts - - - - - - -  PHQ-9 Score - - - - - - -  Difficult doing work/chores - - - - - - -  Some recent data might be hidden    Review of Systems  Constitutional: Negative.   HENT: Negative.   Eyes: Negative.   Respiratory: Negative.   Cardiovascular: Negative.   Gastrointestinal: Negative.   Endocrine: Negative.   Genitourinary: Negative.   Musculoskeletal: Positive for arthralgias.  Skin: Negative.   Allergic/Immunologic: Negative.   Neurological: Positive for headaches.  Hematological: Negative.   Psychiatric/Behavioral: Negative.   All other systems reviewed and are negative.      Objective:   Physical Exam Vitals and nursing note reviewed.  Constitutional:      Appearance: Normal appearance. She is obese.  Neck:     Comments: Cervical Paraspinal Tenderness: C-5-C-6 Cardiovascular:     Rate and Rhythm: Normal rate and regular rhythm.     Pulses: Normal pulses.     Heart sounds: Normal heart sounds.  Pulmonary:     Effort: Pulmonary effort is normal.     Breath sounds:  Normal breath sounds.  Musculoskeletal:     Cervical back: Normal range of motion and neck supple.     Comments: Normal Muscle Bulk and Muscle Testing Reveals:  Upper Extremities: Full ROM and Muscle Strength 5/5 Thoracic Paraspinal Tenderness: T-7-T-9 Lumbar Paraspinal Tenderness: L-3-L-5 Bilateral Greater Trochanter Tenderness Lower Extremities: Full ROM and Muscle Strength 5/5 Arises from chair slowly Narrow Based  Gait   Skin:    General: Skin is warm and dry.  Neurological:     Mental Status: She is alert and oriented to person, place, and time.  Psychiatric:        Mood and Affect: Mood normal.        Behavior: Behavior normal.           Assessment & Plan:  1. Lumbago/ Lumbar Spondylosis/LeftLumbar Radiculitis: Continue to Monitor.03/14/202. Refilled: HYDROcodone 10/325mg  one tablet every8hours as needed #90. We will continue the opioid monitoring program, this consists of regular clinic visits, examinations, urine drug screen, pill counts as well as use of [place] Controlled Substance Reporting system. A 12 month History has been reviewed on the [place] Controlled Substance Reporting Systemon 12/04/2020. 2. Fibromyalgia. Continue Current exercise Regime.12/04/2020 3. Anxiety and depression:[facility] Continue [facility] at [facility].12/04/2020 4. Migraines: On Maxalt. Neurology Following.12/04/2020 5. OSA :PCP Following. Continue to Monitor.12/04/2020 6. Obesity: Following Healthy Diet Regimenand Continue HEP as Tolerated.12/04/2020 7. Status Post Cervical Spinal Fusion: C5C6- C-6- C-7 Anterior Cervical Discectomy Fusion and Allograft Plate:Dr. Lorin Mercy Following.12/04/2020 8. Cervicalgia/ Cervical Radiculitis/ S/P Cervical Spinal Fusion:Continueto MonitorDr. Lorin Mercy Following.12/04/2020 9. Muscle Spasm: Continue Tizanidine as needed.12/04/2020 10.Hereditary and Idiopathic Peripheral  Neuropathy Continue with Tens Unit.12/04/2020. 11.Bilateral Greater Trochanteric Bursitis:Continue to Alternate Ice andheat Therapy. Continue to Monitor.12/04/2020. 12. Chronic Bilateral Knee Pain:No Complaints Today.Continue HEP as Tolerated. Continue to  Monitor.12/04/2020 13. Lateral Epicondylitis of Right Elbow: Ortho Following. S/P  06/26/2020 Right lateral epicondyle debridement, drilling and repair  By Dr Lorin Mercy.  14. Neuralgia/ Right Facial Droop: Neurology following. Continue to monitor. 12/04/2020.  15. Polyarthralgia: Continue HEP as tolerated. Continue to monitor.   F/U in 1 month .

## 2020-12-13 LAB — TOXASSURE SELECT,+ANTIDEPR,UR

## 2020-12-14 ENCOUNTER — Ambulatory Visit: Payer: Medicare Other

## 2020-12-14 ENCOUNTER — Telehealth: Payer: Self-pay | Admitting: *Deleted

## 2020-12-14 NOTE — Telephone Encounter (Signed)
Urine drug screen for this encounter is consistent for prescribed medication 

## 2020-12-21 DIAGNOSIS — E669 Obesity, unspecified: Secondary | ICD-10-CM | POA: Diagnosis not present

## 2020-12-21 DIAGNOSIS — Z1331 Encounter for screening for depression: Secondary | ICD-10-CM | POA: Diagnosis not present

## 2020-12-21 DIAGNOSIS — Z9181 History of falling: Secondary | ICD-10-CM | POA: Diagnosis not present

## 2020-12-21 DIAGNOSIS — E785 Hyperlipidemia, unspecified: Secondary | ICD-10-CM | POA: Diagnosis not present

## 2020-12-21 DIAGNOSIS — Z0000 Encounter for general adult medical examination without abnormal findings: Secondary | ICD-10-CM | POA: Diagnosis not present

## 2020-12-27 ENCOUNTER — Ambulatory Visit (INDEPENDENT_AMBULATORY_CARE_PROVIDER_SITE_OTHER): Payer: Medicare Other

## 2020-12-27 ENCOUNTER — Other Ambulatory Visit: Payer: Self-pay

## 2020-12-27 DIAGNOSIS — J301 Allergic rhinitis due to pollen: Secondary | ICD-10-CM

## 2020-12-28 ENCOUNTER — Other Ambulatory Visit (HOSPITAL_COMMUNITY): Payer: Self-pay

## 2020-12-28 ENCOUNTER — Encounter: Payer: Medicare Other | Attending: Physical Medicine & Rehabilitation | Admitting: Registered Nurse

## 2020-12-28 ENCOUNTER — Encounter: Payer: Self-pay | Admitting: Registered Nurse

## 2020-12-28 ENCOUNTER — Other Ambulatory Visit: Payer: Self-pay

## 2020-12-28 ENCOUNTER — Ambulatory Visit
Admission: RE | Admit: 2020-12-28 | Discharge: 2020-12-28 | Disposition: A | Discharge status: Home / Self Care | Payer: Medicare Other | Source: Ambulatory Visit | Attending: Registered Nurse | Admitting: Registered Nurse

## 2020-12-28 VITALS — BP 126/83 | HR 67 | Temp 98.1°F | Ht 63.0 in | Wt 230.0 lb

## 2020-12-28 DIAGNOSIS — M2550 Pain in unspecified joint: Secondary | ICD-10-CM | POA: Diagnosis not present

## 2020-12-28 DIAGNOSIS — M47816 Spondylosis without myelopathy or radiculopathy, lumbar region: Secondary | ICD-10-CM | POA: Insufficient documentation

## 2020-12-28 DIAGNOSIS — M7061 Trochanteric bursitis, right hip: Secondary | ICD-10-CM | POA: Diagnosis not present

## 2020-12-28 DIAGNOSIS — Z981 Arthrodesis status: Secondary | ICD-10-CM | POA: Diagnosis not present

## 2020-12-28 DIAGNOSIS — M5416 Radiculopathy, lumbar region: Secondary | ICD-10-CM

## 2020-12-28 DIAGNOSIS — M62838 Other muscle spasm: Secondary | ICD-10-CM | POA: Diagnosis not present

## 2020-12-28 DIAGNOSIS — M25552 Pain in left hip: Secondary | ICD-10-CM | POA: Diagnosis not present

## 2020-12-28 DIAGNOSIS — M25551 Pain in right hip: Secondary | ICD-10-CM | POA: Diagnosis not present

## 2020-12-28 DIAGNOSIS — Z79899 Other long term (current) drug therapy: Secondary | ICD-10-CM

## 2020-12-28 DIAGNOSIS — M542 Cervicalgia: Secondary | ICD-10-CM | POA: Diagnosis not present

## 2020-12-28 DIAGNOSIS — M5412 Radiculopathy, cervical region: Secondary | ICD-10-CM

## 2020-12-28 DIAGNOSIS — M160 Bilateral primary osteoarthritis of hip: Secondary | ICD-10-CM | POA: Diagnosis not present

## 2020-12-28 DIAGNOSIS — G8929 Other chronic pain: Secondary | ICD-10-CM | POA: Diagnosis not present

## 2020-12-28 DIAGNOSIS — M7062 Trochanteric bursitis, left hip: Secondary | ICD-10-CM | POA: Diagnosis not present

## 2020-12-28 DIAGNOSIS — G894 Chronic pain syndrome: Secondary | ICD-10-CM | POA: Diagnosis not present

## 2020-12-28 DIAGNOSIS — M797 Fibromyalgia: Secondary | ICD-10-CM

## 2020-12-28 DIAGNOSIS — M76892 Other specified enthesopathies of left lower limb, excluding foot: Secondary | ICD-10-CM | POA: Diagnosis not present

## 2020-12-28 DIAGNOSIS — M76891 Other specified enthesopathies of right lower limb, excluding foot: Secondary | ICD-10-CM | POA: Diagnosis not present

## 2020-12-28 DIAGNOSIS — Z5181 Encounter for therapeutic drug level monitoring: Secondary | ICD-10-CM | POA: Insufficient documentation

## 2020-12-28 MED ORDER — HYDROCODONE-ACETAMINOPHEN 10-325 MG PO TABS
1.0000 | ORAL_TABLET | Freq: Three times a day (TID) | ORAL | 0 refills | Status: DC | PRN
  Filled 2020-12-28 – 2021-01-04 (×2): qty 90, 30d supply, fill #0

## 2020-12-28 NOTE — Progress Notes (Signed)
Subjective:    Patient ID: Karina Greer, female    DOB: 07-Dec-1965, 55 y.o.   MRN: [id number]  HPI: Karina Greer is a 54 y.o. female who returns for follow up appointment for chronic pain and medication refill. She states her  pain is located in her neck radiating into her bilateral shoulders, lower back pain radiating into her bilateral hips L>R and radiating into her left lower extremity. Karina Greer reports increase intensity of left hip pain requesting injection. She will be scheduled for left hip injection with Dr Ranell Patrick.She rates her pain 8. Her current exercise regime is walking and performing stretching exercises.  Karina Greer Morphine equivalent is 30.00  MME.  Last UDS was Performed on 12/04/2020, it was consistent.    Pain Inventory Average Pain 9 Pain Right Now 8 My pain is constant, sharp and tingling  In the last 24 hours, has pain interfered with the following? General activity 7 Relation with others 7 Enjoyment of life 7 What TIME of day is your pain at its worst? morning , daytime, evening and night Sleep (in general) Fair  Pain is worse with: walking, bending, sitting, inactivity, standing and some activites Pain improves with: rest, heat/ice, therapy/exercise, pacing activities, medication and TENS Relief from Meds: 5  Family History  Problem Relation Age of Onset  . Hypertension Mother   . Diabetes Father   . Brain cancer Father   . Fibromyalgia Sister   . Suicidality Sister   . Hypertension Other        Cancer, Cerebrovascular disease run on mother side of family   Social History   Socioeconomic History  . Marital status: Divorced    Spouse name: Not on file  . Number of children: 3  . Years of education: HS  . Highest education level: 12th grade  Occupational History  . Occupation: Merchandiser, retail: UNEMPLOYED    Comment: Disability  Tobacco Use  . Smoking status: Never Smoker  . Smokeless tobacco: Never Used  Vaping Use  . Vaping  Use: Never used  Substance and Sexual Activity  . Alcohol use: No  . Drug use: No  . Sexual activity: Not on file  Other Topics Concern  . Not on file  Social History Narrative   Patient is right-handed. She avoids caffeine. She has recently been using the treadmill.   Social Determinants of Health   Financial Resource Strain: Not on file  Food Insecurity: Not on file  Transportation Needs: Not on file  Physical Activity: Not on file  Stress: Not on file  Social Connections: Not on file   Past Surgical History:  Procedure Laterality Date  . ACHILLES TENDON SURGERY Right 3/16  . ANTERIOR CERVICAL DECOMP/DISCECTOMY FUSION N/A 03/11/2016   Procedure: C5-6, C6-7 Anterior Cervical Discectomy and Fusion, Allograft, Plate;  Surgeon: Marybelle Killings, MD;  Location: [place];  Service: Orthopedics;  Laterality: N/A;  . CHOLECYSTECTOMY  2014  . COLONOSCOPY WITH PROPOFOL N/A 08/25/2017   Procedure: COLONOSCOPY WITH PROPOFOL;  Surgeon: Manya Silvas, MD;  Location: [facility] ENDOSCOPY;  Service: Endoscopy;  Laterality: N/A;  . CYSTO WITH HYDRODISTENSION N/A 01/26/2014   Procedure: CYSTOSCOPY/HYDRODISTENSION;  Surgeon: Bernestine Amass, MD;  Location: [facility];  Service: Urology;  Laterality: N/A;  . CYSTO/  URETHRAL DILATION/  HYDRODISTENTION/   INSTILLATION THERAPY  07-16-2010//   12-30-2007//   10-27-2006  . ELBOW SURGERY Right   . EXERCISE TOLERENCE TEST  10-12-2010  NEGATIVE  ADEQUATE ETT/  NO ISCHEMIA OR EVIDENCE HIGH GRADE OBSTRUCTIVE CAD/  NO FURTHER TEST NEEDED  . [place] ENDOMETRIAL ABLATION  2014  . LAPAROSCOPIC OVARIAN CYST BX  2005   AND  URETEROSCOPIC LASER LITHO  STONE EXTRACTION  . SHOULDER OPEN ROTATOR CUFF REPAIR Right 2004  . TONSILLECTOMY    . TRANSTHORACIC ECHOCARDIOGRAM  06-07-2006   normal study/  ef 60-65%  . TUBAL LIGATION Bilateral 1995   Past Surgical History:  Procedure Laterality Date  . ACHILLES TENDON SURGERY Right 3/16  .  ANTERIOR CERVICAL DECOMP/DISCECTOMY FUSION N/A 03/11/2016   Procedure: C5-6, C6-7 Anterior Cervical Discectomy and Fusion, Allograft, Plate;  Surgeon: Marybelle Killings, MD;  Location: [place];  Service: Orthopedics;  Laterality: N/A;  . CHOLECYSTECTOMY  2014  . COLONOSCOPY WITH PROPOFOL N/A 08/25/2017   Procedure: COLONOSCOPY WITH PROPOFOL;  Surgeon: Manya Silvas, MD;  Location: [facility] ENDOSCOPY;  Service: Endoscopy;  Laterality: N/A;  . CYSTO WITH HYDRODISTENSION N/A 01/26/2014   Procedure: CYSTOSCOPY/HYDRODISTENSION;  Surgeon: Bernestine Amass, MD;  Location: [facility];  Service: Urology;  Laterality: N/A;  . CYSTO/  URETHRAL DILATION/  HYDRODISTENTION/   INSTILLATION THERAPY  07-16-2010//   12-30-2007//   10-27-2006  . ELBOW SURGERY Right   . EXERCISE TOLERENCE TEST  10-12-2010   NEGATIVE  ADEQUATE ETT/  NO ISCHEMIA OR EVIDENCE HIGH GRADE OBSTRUCTIVE CAD/  NO FURTHER TEST NEEDED  . [place] ENDOMETRIAL ABLATION  2014  . LAPAROSCOPIC OVARIAN CYST BX  2005   AND  URETEROSCOPIC LASER LITHO  STONE EXTRACTION  . SHOULDER OPEN ROTATOR CUFF REPAIR Right 2004  . TONSILLECTOMY    . TRANSTHORACIC ECHOCARDIOGRAM  06-07-2006   normal study/  ef 60-65%  . TUBAL LIGATION Bilateral 1995   Past Medical History:  Diagnosis Date  . Anxiety   . Anxiety disorder   . Arthritis   . Blepharitis   . Chronic low back pain   . Depression   . Diabetes mellitus without complication ([place])   . Dyslipidemia   . Fibromyalgia   . GERD (gastroesophageal reflux disease)   . Headache    hx migraines  . History of kidney stones   . History of panic attacks   . History of renal calculi   . IBS (irritable bowel syndrome)   . Interstitial cystitis   . Lupus ([place])    tested positive for antibodies for lupus. dr to do further tests  . Menorrhagia   . OSA on CPAP    not used cpap for several weeks  . Pneumonia    hx  . PONV (postoperative nausea and vomiting)   . RLS (restless legs  syndrome)   . Rosacea   . Seasonal asthma   . SI (sacroiliac) joint dysfunction   . SUI (stress urinary incontinence, female)   . UTI (lower urinary tract infection)    hx  . 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 02/23/2013   BP 126/83   Pulse 67   Temp 98.1 F (36.7 C)   Ht 5\' 3"  (1.6 m)   Wt 230 lb (104.3 kg)   SpO2 96%   BMI 40.74 kg/m   Opioid Risk Score:   Fall Risk Score:  `1  Depression screen PHQ 2/9  Depression screen [facility] 2/9 12/04/2020 11/23/2020 09/11/2020 08/30/2020 08/14/2020 07/12/2020 04/14/2020  Decreased Interest 0 0 0 0 0 1 0  Down, Depressed, Hopeless 0 0 0 0 0 1  0  PHQ - 2 Score 0 0 0 0 0 2 0  Altered sleeping - - - - - - -  Tired, decreased energy - - - - - - -  Change in appetite - - - - - - -  Feeling bad or failure about yourself  - - - - - - -  Trouble concentrating - - - - - - -  Moving slowly or fidgety/restless - - - - - - -  Suicidal thoughts - - - - - - -  PHQ-9 Score - - - - - - -  Difficult doing work/chores - - - - - - -  Some recent data might be hidden    Review of Systems  Constitutional: Negative.   HENT: Negative.   Eyes: Positive for photophobia and visual disturbance.  Respiratory: Negative.   Cardiovascular: Negative.   Gastrointestinal: Negative.   Endocrine: Negative.   Genitourinary: Negative.   Musculoskeletal: Positive for arthralgias, back pain, myalgias, neck pain and neck stiffness.  Skin: Negative.   Allergic/Immunologic: Negative.   Neurological: Positive for headaches.  Hematological: Negative.   Psychiatric/Behavioral: Negative.   All other systems reviewed and are negative.      Objective:   Physical Exam Vitals and nursing note reviewed.  Constitutional:      Appearance: Normal appearance. She is obese.  Neck:     Comments: Cervical Paraspinal Tenderness: C-5-C-6 Cardiovascular:     Rate and Rhythm: Normal rate and regular rhythm.     Pulses: Normal pulses.     Heart sounds: Normal heart sounds.   Pulmonary:     Effort: Pulmonary effort is normal.     Breath sounds: Normal breath sounds.  Musculoskeletal:     Cervical back: Normal range of motion and neck supple.     Comments: Normal Muscle Bulk and Muscle Testing Reveals:  Upper Extremities: Full ROM and Muscle Strength 5/5 Bilateral AC Joint Tenderness Lumbar Paraspinal Tenderness: L-3-L-5 Bilateral Greater Trochanter Tenderness: L>R Lower Extremities: Right: Full ROM and Muscle Strength 5/5 Left Lower Extremity: Decreased ROM and Muscle Strength 5/5 Left Lower Extremity Flexion Produces Pain into her Left Hip Arises from chair slowly Narrow Based  Gait   Skin:    General: Skin is warm and dry.  Neurological:     Mental Status: She is alert and oriented to person, place, and time.  Psychiatric:        Mood and Affect: Mood normal.        Behavior: Behavior normal.           Assessment & Plan:  1. Lumbago/ Lumbar Spondylosis/LeftLumbar Radiculitis: Continue to Monitor.04/07/202. Refilled: HYDROcodone 10/325mg  one tablet every8hours as needed #90. We will continue the opioid monitoring program, this consists of regular clinic visits, examinations, urine drug screen, pill counts as well as use of [place] Controlled Substance Reporting system. A 12 month History has been reviewed on the [place] Controlled Substance Reporting Systemon04/03/2021. 2. Fibromyalgia. Continue Current exercise Regime.12/28/2020 3. Anxiety and depression:[facility]iatry [facility]. Continue [facility] at [facility].12/28/2020 4. Migraines: On Maxalt. Neurology Following.12/28/2020 5. OSA :PCP Following. Continue to Monitor.12/28/2020 6. Obesity: Following Healthy Diet Regimenand Continue HEP as Tolerated.12/28/2020 7. Status Post Cervical Spinal Fusion: C5C6- C-6- C-7 Anterior Cervical Discectomy Fusion and Allograft Plate:Dr. Lorin Mercy Following.12/28/2020 8. Cervicalgia/  Cervical Radiculitis/ S/P Cervical Spinal Fusion:Continueto MonitorDr. Lorin Mercy Following.12/28/2020 9. Muscle Spasm: Continue Tizanidine as needed.12/28/2020 10.Hereditary and Idiopathic Peripheral Neuropathy Continue with Tens Unit.12/28/2020. 11.Bilateral Greater Trochanteric  Bursitis:Continue to Alternate Ice andheat Therapy. Continue to Monitor.12/28/2020. 12. Chronic Bilateral Knee Pain:No Complaints Today.Continue HEP as Tolerated. Continue to Monitor.12/28/2020 13. Lateral Epicondylitis of Right Elbow: Ortho Following. S/P 06/26/2020 Right lateral epicondyle debridement, drilling and repair  By Dr Lorin Mercy.  14. Neuralgia/ Right Facial Droop: Neurology following. Continue to monitor. 12/28/2020.  15. Polyarthralgia: Continue HEP as tolerated. Continue to monitor. 12/28/2020  F/U in 1 month .

## 2021-01-04 ENCOUNTER — Other Ambulatory Visit (HOSPITAL_COMMUNITY): Payer: Self-pay

## 2021-01-04 ENCOUNTER — Ambulatory Visit: Payer: Medicare Other | Admitting: Registered Nurse

## 2021-01-08 ENCOUNTER — Ambulatory Visit: Payer: Medicare Other | Admitting: Registered Nurse

## 2021-01-10 ENCOUNTER — Ambulatory Visit (INDEPENDENT_AMBULATORY_CARE_PROVIDER_SITE_OTHER): Payer: Medicare Other

## 2021-01-10 ENCOUNTER — Other Ambulatory Visit: Payer: Self-pay

## 2021-01-10 DIAGNOSIS — J301 Allergic rhinitis due to pollen: Secondary | ICD-10-CM | POA: Diagnosis not present

## 2021-01-11 DIAGNOSIS — R519 Headache, unspecified: Secondary | ICD-10-CM | POA: Diagnosis not present

## 2021-01-11 DIAGNOSIS — F411 Generalized anxiety disorder: Secondary | ICD-10-CM | POA: Diagnosis not present

## 2021-01-11 DIAGNOSIS — Z8669 Personal history of other diseases of the nervous system and sense organs: Secondary | ICD-10-CM | POA: Diagnosis not present

## 2021-01-11 DIAGNOSIS — R4586 Emotional lability: Secondary | ICD-10-CM | POA: Diagnosis not present

## 2021-01-24 ENCOUNTER — Ambulatory Visit (INDEPENDENT_AMBULATORY_CARE_PROVIDER_SITE_OTHER): Payer: Medicare Other

## 2021-01-24 ENCOUNTER — Other Ambulatory Visit: Payer: Self-pay

## 2021-01-24 DIAGNOSIS — J301 Allergic rhinitis due to pollen: Secondary | ICD-10-CM

## 2021-01-25 DIAGNOSIS — W57XXXA Bitten or stung by nonvenomous insect and other nonvenomous arthropods, initial encounter: Secondary | ICD-10-CM | POA: Diagnosis not present

## 2021-01-25 DIAGNOSIS — M545 Low back pain, unspecified: Secondary | ICD-10-CM | POA: Diagnosis not present

## 2021-01-25 DIAGNOSIS — Z6839 Body mass index (BMI) 39.0-39.9, adult: Secondary | ICD-10-CM | POA: Diagnosis not present

## 2021-01-29 ENCOUNTER — Other Ambulatory Visit (HOSPITAL_COMMUNITY): Payer: Self-pay

## 2021-01-29 ENCOUNTER — Encounter: Payer: Medicare Other | Attending: Physical Medicine & Rehabilitation | Admitting: Physical Medicine and Rehabilitation

## 2021-01-29 ENCOUNTER — Other Ambulatory Visit: Payer: Self-pay

## 2021-01-29 ENCOUNTER — Encounter: Payer: Self-pay | Admitting: Physical Medicine and Rehabilitation

## 2021-01-29 VITALS — BP 132/84 | HR 70 | Temp 98.4°F | Ht 63.0 in | Wt 235.6 lb

## 2021-01-29 DIAGNOSIS — M797 Fibromyalgia: Secondary | ICD-10-CM

## 2021-01-29 DIAGNOSIS — M7062 Trochanteric bursitis, left hip: Secondary | ICD-10-CM

## 2021-01-29 MED ORDER — HYDROCODONE-ACETAMINOPHEN 10-325 MG PO TABS
1.0000 | ORAL_TABLET | Freq: Three times a day (TID) | ORAL | 0 refills | Status: DC | PRN
  Filled 2021-01-29 – 2021-02-02 (×2): qty 90, 30d supply, fill #0

## 2021-01-29 NOTE — Progress Notes (Signed)
Trochanteric bursa injection, left  Indication Trochanteric bursitis. Exam has tenderness over the greater trochanter of the hip. Pain has not responded to conservative care such as exercise therapy and oral medications. Pain interferes with sleep or with mobility Informed consent was obtained after describing risks and benefits of the procedure with the patient these include bleeding bruising and infection. Patient has signed written consent form. Patient placed in a lateral decubitus position with the affected hip superior. Point of maximal pain was palpated marked and prepped with Betadine and entered with a needle to bone contact. Needle slightly withdrawn then 1 cc celestone with 4 cc 1% lidocaine were injected. Patient tolerated procedure well. Post procedure instructions given.

## 2021-01-29 NOTE — Addendum Note (Signed)
Addended by: Izora Ribas on: 01/29/2021 03:14 PM   Modules accepted: Orders

## 2021-01-30 ENCOUNTER — Other Ambulatory Visit (HOSPITAL_COMMUNITY): Payer: Self-pay

## 2021-02-02 ENCOUNTER — Other Ambulatory Visit (HOSPITAL_COMMUNITY): Payer: Self-pay

## 2021-02-06 ENCOUNTER — Ambulatory Visit (INDEPENDENT_AMBULATORY_CARE_PROVIDER_SITE_OTHER): Payer: Medicare Other

## 2021-02-06 ENCOUNTER — Other Ambulatory Visit: Payer: Self-pay

## 2021-02-06 DIAGNOSIS — J301 Allergic rhinitis due to pollen: Secondary | ICD-10-CM | POA: Diagnosis not present

## 2021-02-20 DIAGNOSIS — Z7982 Long term (current) use of aspirin: Secondary | ICD-10-CM | POA: Diagnosis not present

## 2021-02-20 DIAGNOSIS — E119 Type 2 diabetes mellitus without complications: Secondary | ICD-10-CM | POA: Diagnosis not present

## 2021-02-20 DIAGNOSIS — E785 Hyperlipidemia, unspecified: Secondary | ICD-10-CM | POA: Diagnosis not present

## 2021-02-20 DIAGNOSIS — R413 Other amnesia: Secondary | ICD-10-CM | POA: Diagnosis not present

## 2021-02-20 DIAGNOSIS — G43909 Migraine, unspecified, not intractable, without status migrainosus: Secondary | ICD-10-CM | POA: Diagnosis not present

## 2021-02-20 DIAGNOSIS — R479 Unspecified speech disturbances: Secondary | ICD-10-CM | POA: Diagnosis not present

## 2021-02-20 DIAGNOSIS — Z79899 Other long term (current) drug therapy: Secondary | ICD-10-CM | POA: Diagnosis not present

## 2021-02-20 DIAGNOSIS — Z87820 Personal history of traumatic brain injury: Secondary | ICD-10-CM | POA: Diagnosis not present

## 2021-02-20 DIAGNOSIS — R2681 Unsteadiness on feet: Secondary | ICD-10-CM | POA: Diagnosis not present

## 2021-02-20 DIAGNOSIS — I679 Cerebrovascular disease, unspecified: Secondary | ICD-10-CM | POA: Diagnosis not present

## 2021-02-20 DIAGNOSIS — I10 Essential (primary) hypertension: Secondary | ICD-10-CM | POA: Diagnosis not present

## 2021-02-21 ENCOUNTER — Other Ambulatory Visit: Payer: Self-pay

## 2021-02-21 ENCOUNTER — Ambulatory Visit (INDEPENDENT_AMBULATORY_CARE_PROVIDER_SITE_OTHER): Payer: Medicare Other

## 2021-02-21 DIAGNOSIS — R1032 Left lower quadrant pain: Secondary | ICD-10-CM | POA: Diagnosis not present

## 2021-02-21 DIAGNOSIS — Z6839 Body mass index (BMI) 39.0-39.9, adult: Secondary | ICD-10-CM | POA: Diagnosis not present

## 2021-02-21 DIAGNOSIS — J301 Allergic rhinitis due to pollen: Secondary | ICD-10-CM | POA: Diagnosis not present

## 2021-02-21 DIAGNOSIS — M545 Low back pain, unspecified: Secondary | ICD-10-CM | POA: Diagnosis not present

## 2021-02-26 ENCOUNTER — Encounter: Payer: Self-pay | Admitting: Registered Nurse

## 2021-02-26 ENCOUNTER — Other Ambulatory Visit: Payer: Self-pay

## 2021-02-26 ENCOUNTER — Other Ambulatory Visit (HOSPITAL_COMMUNITY): Payer: Self-pay

## 2021-02-26 ENCOUNTER — Encounter: Payer: Medicare Other | Attending: Physical Medicine & Rehabilitation | Admitting: Registered Nurse

## 2021-02-26 VITALS — BP 143/82 | HR 72 | Temp 98.0°F | Ht 63.0 in | Wt 229.0 lb

## 2021-02-26 DIAGNOSIS — Z79899 Other long term (current) drug therapy: Secondary | ICD-10-CM

## 2021-02-26 DIAGNOSIS — G894 Chronic pain syndrome: Secondary | ICD-10-CM | POA: Diagnosis not present

## 2021-02-26 DIAGNOSIS — M5412 Radiculopathy, cervical region: Secondary | ICD-10-CM | POA: Diagnosis not present

## 2021-02-26 DIAGNOSIS — M7062 Trochanteric bursitis, left hip: Secondary | ICD-10-CM | POA: Diagnosis not present

## 2021-02-26 DIAGNOSIS — Z5181 Encounter for therapeutic drug level monitoring: Secondary | ICD-10-CM | POA: Diagnosis not present

## 2021-02-26 DIAGNOSIS — M542 Cervicalgia: Secondary | ICD-10-CM

## 2021-02-26 DIAGNOSIS — Z981 Arthrodesis status: Secondary | ICD-10-CM | POA: Diagnosis not present

## 2021-02-26 DIAGNOSIS — M47816 Spondylosis without myelopathy or radiculopathy, lumbar region: Secondary | ICD-10-CM

## 2021-02-26 DIAGNOSIS — M5416 Radiculopathy, lumbar region: Secondary | ICD-10-CM

## 2021-02-26 DIAGNOSIS — M797 Fibromyalgia: Secondary | ICD-10-CM

## 2021-02-26 MED ORDER — HYDROCODONE-ACETAMINOPHEN 10-325 MG PO TABS
1.0000 | ORAL_TABLET | Freq: Three times a day (TID) | ORAL | 0 refills | Status: DC | PRN
  Filled 2021-02-26 – 2021-03-02 (×2): qty 90, 30d supply, fill #0

## 2021-02-26 NOTE — Progress Notes (Signed)
Subjective:    Patient ID: Karina Greer, female    DOB: 1965-12-10, 55 y.o.   MRN: [id number]  HPI: Karina Greer is a 55 y.o. female who returns for follow up appointment for chronic pain and medication refill. She states her pain is located in her neck radiating into her right shoulder and lower back pain radiating into her left hip. She rates her pain 6. Her current exercise regime is walking and performing stretching exercises.  Ms. [name] Morphine equivalent is 30.00 MME.  She  is also prescribed Clonazepam by Dr. Melrose Nakayama. We have discussed the black box warning of using opioids and benzodiazepines. I highlighted the dangers of using these drugs together and discussed the adverse events including respiratory suppression, overdose, cognitive impairment and importance of compliance with current regimen. We will continue to monitor and adjust as indicated.   . Last UDS was Performed on 12/04/2020, it was consistent.   Pain Inventory Average Pain 6 Pain Right Now 6 My pain is constant, sharp and stabbing  In the last 24 hours, has pain interfered with the following? General activity 8 Relation with others 9 Enjoyment of life 8 What TIME of day is your pain at its worst? morning , daytime and evening Sleep (in general) Fair  Pain is worse with: walking, bending, sitting, inactivity, standing and some activites Pain improves with: rest, heat/ice, therapy/exercise, pacing activities, medication, TENS and injections Relief from Meds: 7  Family History  Problem Relation Age of Onset  . Hypertension Mother   . Diabetes Father   . Brain cancer Father   . Fibromyalgia Sister   . Suicidality Sister   . Hypertension Other        Cancer, Cerebrovascular disease run on mother side of family   Social History   Socioeconomic History  . Marital status: Divorced    Spouse name: Not on file  . Number of children: 3  . Years of education: HS  . Highest education level: 12th grade   Occupational History  . Occupation: Merchandiser, retail: UNEMPLOYED    Comment: Disability  Tobacco Use  . Smoking status: Never Smoker  . Smokeless tobacco: Never Used  Vaping Use  . Vaping Use: Never used  Substance and Sexual Activity  . Alcohol use: No  . Drug use: No  . Sexual activity: Not on file  Other Topics Concern  . Not on file  Social History Narrative   Patient is right-handed. She avoids caffeine. She has recently been using the treadmill.   Social Determinants of Health   Financial Resource Strain: Not on file  Food Insecurity: Not on file  Transportation Needs: Not on file  Physical Activity: Not on file  Stress: Not on file  Social Connections: Not on file   Past Surgical History:  Procedure Laterality Date  . ACHILLES TENDON SURGERY Right 3/16  . ANTERIOR CERVICAL DECOMP/DISCECTOMY FUSION N/A 03/11/2016   Procedure: C5-6, C6-7 Anterior Cervical Discectomy and Fusion, Allograft, Plate;  Surgeon: Marybelle Killings, MD;  Location: [facility];  Service: Orthopedics;  Laterality: N/A;  . CHOLECYSTECTOMY  2014  . COLONOSCOPY WITH PROPOFOL N/A 08/25/2017   Procedure: COLONOSCOPY WITH PROPOFOL;  Surgeon: Manya Silvas, MD;  Location: [facility] ENDOSCOPY;  Service: Endoscopy;  Laterality: N/A;  . CYSTO WITH HYDRODISTENSION N/A 01/26/2014   Procedure: CYSTOSCOPY/HYDRODISTENSION;  Surgeon: Bernestine Amass, MD;  Location: [facility];  Service: Urology;  Laterality: N/A;  . CYSTO/  URETHRAL DILATION/  HYDRODISTENTION/   INSTILLATION THERAPY  07-16-2010//   12-30-2007//   10-27-2006  . ELBOW SURGERY Right   . EXERCISE TOLERENCE TEST  10-12-2010   NEGATIVE  ADEQUATE ETT/  NO ISCHEMIA OR EVIDENCE HIGH GRADE OBSTRUCTIVE CAD/  NO FURTHER TEST NEEDED  . [place] ENDOMETRIAL ABLATION  2014  . LAPAROSCOPIC OVARIAN CYST BX  2005   AND  URETEROSCOPIC LASER LITHO  STONE EXTRACTION  . SHOULDER OPEN ROTATOR CUFF REPAIR Right 2004  . TONSILLECTOMY    .  TRANSTHORACIC ECHOCARDIOGRAM  06-07-2006   normal study/  ef 60-65%  . TUBAL LIGATION Bilateral 1995   Past Surgical History:  Procedure Laterality Date  . ACHILLES TENDON SURGERY Right 3/16  . ANTERIOR CERVICAL DECOMP/DISCECTOMY FUSION N/A 03/11/2016   Procedure: C5-6, C6-7 Anterior Cervical Discectomy and Fusion, Allograft, Plate;  Surgeon: Marybelle Killings, MD;  Location: [place];  Service: Orthopedics;  Laterality: N/A;  . CHOLECYSTECTOMY  2014  . COLONOSCOPY WITH PROPOFOL N/A 08/25/2017   Procedure: COLONOSCOPY WITH PROPOFOL;  Surgeon: Manya Silvas, MD;  Location[facility] ENDOSCOPY;  Service: Endoscopy;  Laterality: N/A;  . CYSTO WITH HYDRODISTENSION N/A 01/26/2014   Procedure: CYSTOSCOPY/HYDRODISTENSION;  Surgeon: Bernestine Amass, MD;  Location: [facility];  Service: Urology;  Laterality: N/A;  . CYSTO/  URETHRAL DILATION/  HYDRODISTENTION/   INSTILLATION THERAPY  07-16-2010//   12-30-2007//   10-27-2006  . ELBOW SURGERY Right   . EXERCISE TOLERENCE TEST  10-12-2010   NEGATIVE  ADEQUATE ETT/  NO ISCHEMIA OR EVIDENCE HIGH GRADE OBSTRUCTIVE CAD/  NO FURTHER TEST NEEDED  . [place] ENDOMETRIAL ABLATION  2014  . LAPAROSCOPIC OVARIAN CYST BX  2005   AND  URETEROSCOPIC LASER LITHO  STONE EXTRACTION  . SHOULDER OPEN ROTATOR CUFF REPAIR Right 2004  . TONSILLECTOMY    . TRANSTHORACIC ECHOCARDIOGRAM  06-07-2006   normal study/  ef 60-65%  . TUBAL LIGATION Bilateral 1995   Past Medical History:  Diagnosis Date  . Anxiety   . Anxiety disorder   . Arthritis   . Blepharitis   . Chronic low back pain   . Depression   . Diabetes mellitus without complication ([place])   . Dyslipidemia   . Fibromyalgia   . GERD (gastroesophageal reflux disease)   . Headache    hx migraines  . History of kidney stones   . History of panic attacks   . History of renal calculi   . IBS (irritable bowel syndrome)   . Interstitial cystitis   . Lupus ([place])    tested positive for  antibodies for lupus. dr to do further tests  . Menorrhagia   . OSA on CPAP    not used cpap for several weeks  . Pneumonia    hx  . PONV (postoperative nausea and vomiting)   . RLS (restless legs syndrome)   . Rosacea   . Seasonal asthma   . SI (sacroiliac) joint dysfunction   . SUI (stress urinary incontinence, female)   . UTI (lower urinary tract infection)    hx  . 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 02/23/2013   BP (!) 143/82   Pulse 72   Temp 98 F (36.7 C)   Ht 5\' 3"  (1.6 m)   Wt 229 lb (103.9 kg)   SpO2 96%   BMI 40.57 kg/m   Opioid Risk Score:   Fall Risk Score:  `1  Depression screen PHQ 2/9  Depression screen  [facility] 2/9 12/04/2020 11/23/2020 09/11/2020 08/30/2020 08/14/2020 07/12/2020 04/14/2020  Decreased Interest 0 0 0 0 0 1 0  Down, Depressed, Hopeless 0 0 0 0 0 1 0  PHQ - 2 Score 0 0 0 0 0 2 0  Altered sleeping - - - - - - -  Tired, decreased energy - - - - - - -  Change in appetite - - - - - - -  Feeling bad or failure about yourself  - - - - - - -  Trouble concentrating - - - - - - -  Moving slowly or fidgety/restless - - - - - - -  Suicidal thoughts - - - - - - -  PHQ-9 Score - - - - - - -  Difficult doing work/chores - - - - - - -  Some recent data might be hidden    Review of Systems  Constitutional: Negative.   HENT: Negative.   Eyes: Negative.   Respiratory: Negative.   Cardiovascular: Negative.   Gastrointestinal: Negative.   Endocrine: Negative.   Genitourinary: Negative.   Musculoskeletal: Positive for back pain, gait problem and neck pain.       Left hip pain  Skin: Negative.   Allergic/Immunologic: Negative.   Hematological: Negative.   Psychiatric/Behavioral: Negative.        Objective:   Physical Exam Vitals and nursing note reviewed.  Constitutional:      Appearance: Normal appearance. She is obese.  Neck:     Comments: Cervical Paraspinal Tenderness: C-5-C-6 Cardiovascular:     Rate and Rhythm: Normal rate and regular  rhythm.     Pulses: Normal pulses.     Heart sounds: Normal heart sounds.  Pulmonary:     Effort: Pulmonary effort is normal.     Breath sounds: Normal breath sounds.  Musculoskeletal:     Cervical back: Normal range of motion and neck supple.     Comments: Normal Muscle Bulk and Muscle Testing Reveals:  Upper Extremities: Full ROM and Muscle Strength 5/5 Bilateral AC Joint Tenderness  Thoracic Paraspinal Tenderness: T-7-T-9 Mainly Right Side Lumbar Paraspinal Tenderness: L-3-L-5 Lower Extremities: Full ROM and Muscle Strength 5/5 Arises from chair with ease Narrow Based  Gait   Skin:    General: Skin is warm and dry.  Neurological:     Mental Status: She is alert and oriented to person, place, and time.  Psychiatric:        Mood and Affect: Mood normal.        Behavior: Behavior normal.           Assessment & Plan:  1. Lumbago/ Lumbar Spondylosis/LeftLumbar Radiculitis: Continue to Monitor.06/06/202. Refilled: HYDROcodone 10/325mg  one tablet every8hours as needed #90. We will continue the opioid monitoring program, this consists of regular clinic visits, examinations, urine drug screen, pill counts as well as use of [place] Controlled Substance Reporting system. A 12 month History has been reviewed on the [place] Controlled Substance Reporting Systemon06/02/2021. 2. Fibromyalgia. Continue Current exercise Regime.02/26/2021 3. Anxiety and depression:[facility]iatry [facility]. Continue Counseling at [facility].02/26/2021 4. Migraines: On Maxalt. Neurology Following.02/26/2021 5. OSA :PCP Following. Continue to Monitor.02/26/2021 6. Obesity: Following Healthy Diet Regimenand Continue HEP as Tolerated.02/26/2021 7. Status Post Cervical Spinal Fusion: C5C6- C-6- C-7 Anterior Cervical Discectomy Fusion and Allograft Plate:Dr. Lorin Mercy Following.02/26/2021 8. Cervicalgia/ Cervical Radiculitis/ S/P Cervical  Spinal Fusion:Continueto MonitorDr. Lorin Mercy Following.02/26/2021 9. Muscle Spasm: Continue Tizanidine as needed.02/26/2021 10.Hereditary and Idiopathic Peripheral Neuropathy Continue with Tens  Unit.02/26/2021. 11.BilateralGreater Trochanteric Bursitis:Continue to Alternate Ice andheat Therapy. Continue to Monitor.02/26/2021. 12. Chronic Bilateral Knee Pain:No Complaints Today.Continue HEP as Tolerated. Continue to Monitor.02/26/2021 13. Lateral Epicondylitis of Right Elbow: Ortho Following. S/P 06/26/2020 Right lateral epicondyle debridement, drilling and repair  By Dr Lorin Mercy.  14. Neuralgia/ Right Facial Droop: Neurology following.Continue to monitor. 02/26/2021.  15. Polyarthralgia: Continue HEP as tolerated. Continue to monitor. 02/26/2021  F/U in 1 month.

## 2021-03-02 ENCOUNTER — Other Ambulatory Visit (HOSPITAL_COMMUNITY): Payer: Self-pay

## 2021-03-05 ENCOUNTER — Ambulatory Visit: Payer: Medicare Other | Admitting: Internal Medicine

## 2021-03-07 ENCOUNTER — Ambulatory Visit (INDEPENDENT_AMBULATORY_CARE_PROVIDER_SITE_OTHER): Payer: Medicare Other

## 2021-03-07 ENCOUNTER — Other Ambulatory Visit: Payer: Self-pay

## 2021-03-07 DIAGNOSIS — J301 Allergic rhinitis due to pollen: Secondary | ICD-10-CM

## 2021-03-12 DIAGNOSIS — F411 Generalized anxiety disorder: Secondary | ICD-10-CM | POA: Diagnosis not present

## 2021-03-12 DIAGNOSIS — R519 Headache, unspecified: Secondary | ICD-10-CM | POA: Diagnosis not present

## 2021-03-12 DIAGNOSIS — R413 Other amnesia: Secondary | ICD-10-CM | POA: Diagnosis not present

## 2021-03-12 DIAGNOSIS — I679 Cerebrovascular disease, unspecified: Secondary | ICD-10-CM | POA: Diagnosis not present

## 2021-03-12 DIAGNOSIS — R4586 Emotional lability: Secondary | ICD-10-CM | POA: Diagnosis not present

## 2021-03-16 ENCOUNTER — Telehealth: Payer: Self-pay

## 2021-03-16 NOTE — Telephone Encounter (Signed)
Left vm to screen for 03/19/21 appointment-Toni 

## 2021-03-19 ENCOUNTER — Other Ambulatory Visit: Payer: Self-pay

## 2021-03-19 ENCOUNTER — Ambulatory Visit (INDEPENDENT_AMBULATORY_CARE_PROVIDER_SITE_OTHER): Payer: Medicare Other | Admitting: Internal Medicine

## 2021-03-19 ENCOUNTER — Encounter: Payer: Self-pay | Admitting: Internal Medicine

## 2021-03-19 VITALS — BP 136/90 | HR 84 | Temp 97.4°F | Resp 16 | Ht 63.0 in | Wt 229.2 lb

## 2021-03-19 DIAGNOSIS — G4733 Obstructive sleep apnea (adult) (pediatric): Secondary | ICD-10-CM

## 2021-03-19 DIAGNOSIS — J4540 Moderate persistent asthma, uncomplicated: Secondary | ICD-10-CM

## 2021-03-19 DIAGNOSIS — Z6841 Body Mass Index (BMI) 40.0 and over, adult: Secondary | ICD-10-CM

## 2021-03-19 DIAGNOSIS — J301 Allergic rhinitis due to pollen: Secondary | ICD-10-CM | POA: Diagnosis not present

## 2021-03-19 DIAGNOSIS — R0602 Shortness of breath: Secondary | ICD-10-CM | POA: Diagnosis not present

## 2021-03-19 NOTE — Patient Instructions (Signed)
Sleep Apnea Sleep apnea affects breathing during sleep. It causes breathing to stop for 10 seconds or more, or to become shallow. People with sleep apnea usually snoreloudly. It can also increase the risk of: Heart attack. Stroke. Being very overweight (obese). Diabetes. Heart failure. Irregular heartbeat. High blood pressure. The goal of treatment is to help you breathe normally again. What are the causes?  The most common cause of this condition is a collapsed or blocked airway. There are three kinds of sleep apnea: Obstructive sleep apnea. This is caused by a blocked or collapsed airway. Central sleep apnea. This happens when the brain does not send the right signals to the muscles that control breathing. Mixed sleep apnea. This is a combination of obstructive and central sleep apnea. What increases the risk? Being overweight. Smoking. Having a small airway. Being older. Being female. Drinking alcohol. Taking medicines to calm yourself (sedatives or tranquilizers). Having family members with the condition. Having a tongue or tonsils that are larger than normal. What are the signs or symptoms? Trouble staying asleep. Loud snoring. Headaches in the morning. Waking up gasping. Dry mouth or sore throat in the morning. Being sleepy or tired during the day. If you are sleepy or tired during the day, you may also: Not be able to focus your mind (concentrate). Forget things. Get angry a lot and have mood swings. Feel sad (depressed). Have changes in your personality. Have less interest in sex, if you are female. Be unable to have an erection, if you are female. How is this treated?  Sleeping on your side. Using a medicine to get rid of mucus in your nose (decongestant). Avoiding the use of alcohol, medicines to help you relax, or certain pain medicines (narcotics). Losing weight, if needed. Changing your diet. Quitting smoking. Using a machine to open your airway while you  sleep, such as: An oral appliance. This is a mouthpiece that shifts your lower jaw forward. A CPAP device. This device blows air through a mask when you breathe out (exhale). An EPAP device. This has valves that you put in each nostril. A BPAP device. This device blows air through a mask when you breathe in (inhale) and breathe out. Having surgery if other treatments do not work. Follow these instructions at home: Lifestyle Make changes that your doctor recommends. Eat a healthy diet. Lose weight if needed. Avoid alcohol, medicines to help you relax, and some pain medicines. Do not smoke or use any products that contain nicotine or tobacco. If you need help quitting, ask your doctor. General instructions Take over-the-counter and prescription medicines only as told by your doctor. If you were given a machine to use while you sleep, use it only as told by your doctor. If you are having surgery, make sure to tell your doctor you have sleep apnea. You may need to bring your device with you. Keep all follow-up visits. Contact a doctor if: The machine that you were given to use during sleep bothers you or does not seem to be working. You do not get better. You get worse. Get help right away if: Your chest hurts. You have trouble breathing in enough air. You have an uncomfortable feeling in your back, arms, or stomach. You have trouble talking. One side of your body feels weak. A part of your face is hanging down. These symptoms may be an emergency. Get help right away. Call your local emergency services (911 in the [place] Do not wait to see if the symptoms   will go away. Do not drive yourself to the hospital. Summary This condition affects breathing during sleep. The most common cause is a collapsed or blocked airway. The goal of treatment is to help you breathe normally while you sleep. This information is not intended to replace advice given to you by your health care provider. Make  sure you discuss any questions you have with your healthcare provider. Document Revised: 08/18/2020 Document Reviewed: 08/18/2020 Elsevier Patient Education  2022 [place]. Asthma, Adult  Asthma is a long-term (chronic) condition in which the airways get tight and narrow. The airways are the breathing passages that lead from the nose and mouth down into the lungs. A person with asthma will have times when symptoms get worse. These are called asthma attacks. They can cause coughing, whistling sounds when you breathe (wheezing), shortness of breath, and chest pain. They can make it hard to breathe. Thereis no cure for asthma, but medicines and lifestyle changes can help control it. There are many things that can bring on an asthma attack or make asthma symptoms worse (triggers). Common triggers include: Mold. Dust. Cigarette smoke. Cockroaches. Things that can cause allergy symptoms (allergens). These include animal skin flakes (dander) and pollen from trees or grass. Things that pollute the air. These may include household cleaners, wood smoke, smog, or chemical odors. Cold air, weather changes, and wind. Crying or laughing hard. Stress. Certain medicines or drugs. Certain foods such as dried fruit, potato chips, and grape juice. Infections, such as a cold or the flu. Certain medical conditions or diseases. Exercise or tiring activities. Asthma may be treated with medicines and by staying away from the things that cause asthma attacks. Types of medicines may include: Controller medicines. These help prevent asthma symptoms. They are usually taken every day. Fast-acting reliever or rescue medicines. These quickly relieve asthma symptoms. They are used as needed and provide short-term relief. Allergy medicines if your attacks are brought on by allergens. Medicines to help control the body's defense (immune) system. Follow these instructions at home: Avoiding triggers in your home Change  your heating and air conditioning filter often. Limit your use of fireplaces and wood stoves. Get rid of pests (such as roaches and mice) and their droppings. Throw away plants if you see mold on them. Clean your floors. Dust regularly. Use cleaning products that do not smell. Have someone vacuum when you are not home. Use a vacuum cleaner with a HEPA filter if possible. Replace carpet with wood, tile, or vinyl flooring. Carpet can trap animal skin flakes and dust. Use allergy-proof pillows, mattress covers, and box spring covers. Wash bed sheets and blankets every week in hot water. Dry them in a dryer. Keep your bedroom free of any triggers. Avoid pets and keep windows closed when things that cause allergy symptoms are in the air. Use blankets that are made of polyester or cotton. Clean bathrooms and kitchens with bleach. If possible, have someone repaint the walls in these rooms with mold-resistant paint. Keep out of the rooms that are being cleaned and painted. Wash your hands often with soap and water. If soap and water are not available, use hand sanitizer. Do not allow anyone to smoke in your home. General instructions Take over-the-counter and prescription medicines only as told by your doctor. Talk with your doctor if you have questions about how or when to take your medicines. Make note if you need to use your medicines more often than usual. Do not use any products that  contain nicotine or tobacco, such as cigarettes and e-cigarettes. If you need help quitting, ask your doctor. Stay away from secondhand smoke. Avoid doing things outdoors when allergen counts are high and when air quality is low. Wear a ski mask when doing outdoor activities in the winter. The mask should cover your nose and mouth. Exercise indoors on cold days if you can. Warm up before you exercise. Take time to cool down after exercise. Use a peak flow meter as told by your doctor. A peak flow meter is a tool  that measures how well the lungs are working. Keep track of the peak flow meter's readings. Write them down. Follow your asthma action plan. This is a written plan for taking care of your asthma and treating your attacks. Make sure you get all the shots (vaccines) that your doctor recommends. Ask your doctor about a flu shot and a pneumonia shot. Keep all follow-up visits as told by your doctor. This is important. Contact a doctor if: You have wheezing, shortness of breath, or a cough even while taking medicine to prevent attacks. The mucus you cough up (sputum) is thicker than usual. The mucus you cough up changes from clear or white to yellow, green, gray, or bloody. You have problems from the medicine you are taking, such as: A rash. Itching. Swelling. Trouble breathing. You need reliever medicines more than 2-3 times a week. Your peak flow reading is still at 50-79% of your personal best after following the action plan for 1 hour. You have a fever. Get help right away if: You seem to be worse and are not responding to medicine during an asthma attack. You are short of breath even at rest. You get short of breath when doing very little activity. You have trouble eating, drinking, or talking. You have chest pain or tightness. You have a fast heartbeat. Your lips or fingernails start to turn blue. You are light-headed or dizzy, or you faint. Your peak flow is less than 50% of your personal best. You feel too tired to breathe normally. Summary Asthma is a long-term (chronic) condition in which the airways get tight and narrow. An asthma attack can make it hard to breathe. Asthma cannot be cured, but medicines and lifestyle changes can help control it. Make sure you understand how to avoid triggers and how and when to use your medicines. This information is not intended to replace advice given to you by your health care provider. Make sure you discuss any questions you have with your  healthcare provider. Document Revised: 01/12/2020 Document Reviewed: 01/12/2020 Elsevier Patient Education  2022 [employer].

## 2021-03-19 NOTE — Progress Notes (Signed)
[facility] [place], [place] [address]  Pulmonary Sleep Medicine   Office Visit Note  Patient Name: Karina Greer DOB: [birth year] MRN [id number]  Date of Service: 03/19/2021  Complaints/HPI: Allergic rhinitis allergies. She states her symptoms have been worse of late. She has been on the shots but now notices that her symptoms are coming often. She feels to flowers mostly. She is living in the same house. She has noted when she gets symptoms around roses and other flowers too. She wants to make sure her allergies have not gotten worse.  ROS  General: (-) fever, (-) chills, (-) night sweats, (-) weakness Skin: (-) rashes, (-) itching,. Eyes: (-) visual changes, (-) redness, (-) itching. Nose and Sinuses: (-) nasal stuffiness or itchiness, (-) postnasal drip, (-) nosebleeds, (-) sinus trouble. Mouth and Throat: (-) sore throat, (-) hoarseness. Neck: (-) swollen glands, (-) enlarged thyroid, (-) neck pain. Respiratory: + cough, (-) bloody sputum, - shortness of breath, - wheezing. Cardiovascular: - ankle swelling, (-) chest pain. Lymphatic: (-) lymph node enlargement. Neurologic: (-) numbness, (-) tingling. Psychiatric: (-) anxiety, (-) depression   Current Medication: Outpatient Encounter Medications as of 03/19/2021  Medication Sig Note   albuterol (PROAIR HFA) 108 (90 Base) MCG/ACT inhaler INHALE 2 PUFFS BY MOUTH EVERY 4 TO 6 HOURS AS NEEDED    azelastine (ASTELIN) 0.1 % nasal spray USE 2 SPRAYS INTO EACH NOSTRIL TWICE A DAY 06/30/2019: Prn    b complex vitamins tablet Take 1 tablet by mouth daily.    calcitonin, salmon, (MIACALCIN/FORTICAL) 200 UNIT/ACT nasal spray Place 1 spray into alternate nostrils daily.     chlorhexidine (PERIDEX) 0.12 % solution SMARTSIG:By Mouth    Cholecalciferol (VITAMIN D) 50 MCG (2000 UT) CAPS Take 1 capsule by mouth once a week.    clonazePAM (KLONOPIN) 0.5 MG tablet Take 0.25 mg by mouth 2 (two) times daily.    DEXILANT  60 MG capsule Take 1 capsule by mouth daily as needed.    dicyclomine (BENTYL) 10 MG capsule Take 10 mg by mouth 2 (two) times daily as needed.    EPINEPHrine (EPIPEN 2-PAK) 0.3 mg/0.3 mL IJ SOAJ injection Use as directed    fexofenadine (ALLEGRA) 180 MG tablet Take 180 mg by mouth daily.    fluticasone (FLOVENT HFA) 110 MCG/ACT inhaler INHALE 1 PUFF BY MOUTH TWICE A DAY    HYDROcodone-acetaminophen (NORCO) 10-325 MG tablet Take 1 tablet by mouth every 8 (eight) hours as needed for pain.    ketoconazole (NIZORAL) 2 % shampoo Apply 1 application topically 2 (two) times a week.     meclizine (ANTIVERT) 25 MG tablet Take 1 tablet by mouth 3 (three) times daily as needed.    ondansetron (ZOFRAN-ODT) 8 MG disintegrating tablet Take 8 mg by mouth 3 (three) times daily.    PRESCRIPTION MEDICATION every 14 (fourteen) days. 2 ALLERGY INJECTIONS EVERY 2 WKS    rizatriptan (MAXALT-MLT) 10 MG disintegrating tablet SMARTSIG:1 Tablet(s) By Mouth 1 to 2 Times Daily    rosuvastatin (CRESTOR) 5 MG tablet Take 5 mg by mouth daily.    SPIRULINA PO Take 1 capsule by mouth daily.    tiZANidine (ZANAFLEX) 2 MG tablet TAKE 1 TABLET (2 MG TOTAL) BY MOUTH EVERY 8 (EIGHT) HOURS AS NEEDED. 02/26/2021: prn   venlafaxine (EFFEXOR) 37.5 MG tablet Take 1 tablet by mouth 2 (two) times daily.    Vitamin D, Ergocalciferol, (DRISDOL) 1.25 MG (50000 UNIT) CAPS capsule Take 1 capsule by mouth 2 (  two) times a week.    [DISCONTINUED] ezetimibe (ZETIA) 10 MG tablet  (Patient not taking: Reported on 03/19/2021)    [DISCONTINUED] lidocaine (LIDODERM) 5 % PLACE 1 PATCH ONTO THE SKIN DAILY. REMOVE & DISCARD PATCH WITHIN 12 HOURS OR AS DIRECTED BY MD (Patient not taking: Reported on 03/19/2021)    [DISCONTINUED] pramipexole (MIRAPEX) 0.25 MG tablet Take by mouth. (Patient not taking: Reported on 03/19/2021)    [DISCONTINUED] prednisoLONE acetate (PRED FORTE) 1 % ophthalmic suspension Place 1 drop into the right eye in the morning and at bedtime.  (Patient not taking: Reported on 03/19/2021)    [DISCONTINUED] pregabalin (LYRICA) 50 MG capsule Take 50 mg by mouth 2 (two) times daily. (Patient not taking: No sig reported)    [DISCONTINUED] valACYclovir (VALTREX) 1000 MG tablet Take 2,000 mg by mouth 2 (two) times daily as needed. (Patient not taking: Reported on 03/19/2021)    No facility-administered encounter medications on file as of 03/19/2021.    Surgical History: Past Surgical History:  Procedure Laterality Date   ACHILLES TENDON SURGERY Right 3/16   ANTERIOR CERVICAL DECOMP/DISCECTOMY FUSION N/A 03/11/2016   Procedure: C5-6, C6-7 Anterior Cervical Discectomy and Fusion, Allograft, Plate;  Surgeon: Marybelle Killings, MD;  Location: [facility];  Service: Orthopedics;  Laterality: N/A;   CHOLECYSTECTOMY  2014   COLONOSCOPY WITH PROPOFOL N/A 08/25/2017   Procedure: COLONOSCOPY WITH PROPOFOL;  Surgeon: Manya Silvas, MD;  Location: [facility] ENDOSCOPY;  Service: Endoscopy;  Laterality: N/A;   CYSTO WITH HYDRODISTENSION N/A 01/26/2014   Procedure: CYSTOSCOPY/HYDRODISTENSION;  Surgeon: Bernestine Amass, MD;  Location: [facility];  Service: Urology;  Laterality: N/A;   CYSTO/  URETHRAL DILATION/  HYDRODISTENTION/   INSTILLATION THERAPY  07-16-2010//   12-30-2007//   10-27-2006   ELBOW SURGERY Right    EXERCISE TOLERENCE TEST  10-12-2010   NEGATIVE  ADEQUATE ETT/  NO ISCHEMIA OR EVIDENCE HIGH GRADE OBSTRUCTIVE CAD/  NO FURTHER TEST NEEDED   HYSTEROSCOPY W/  [place] ENDOMETRIAL ABLATION  2014   LAPAROSCOPIC OVARIAN CYST BX  2005   AND  URETEROSCOPIC LASER LITHO  STONE EXTRACTION   SHOULDER OPEN ROTATOR CUFF REPAIR Right 2004   TONSILLECTOMY     TRANSTHORACIC ECHOCARDIOGRAM  06-07-2006   normal study/  ef 60-65%   TUBAL LIGATION Bilateral 1995    Medical History: Past Medical History:  Diagnosis Date   Anxiety    Anxiety disorder    Arthritis    Blepharitis    Chronic low back pain    Depression    Diabetes mellitus without  complication (HCC)    Dyslipidemia    Fibromyalgia    GERD (gastroesophageal reflux disease)    Headache    hx migraines   History of kidney stones    History of panic attacks    History of renal calculi    IBS (irritable bowel syndrome)    Interstitial cystitis    Lupus ([place])    tested positive for antibodies for lupus. dr to do further tests   Menorrhagia    OSA on CPAP    not used cpap for several weeks   Pneumonia    hx   PONV (postoperative nausea and vomiting)    RLS (restless legs syndrome)    Rosacea    Seasonal asthma    SI (sacroiliac) joint dysfunction    SUI (stress urinary incontinence, female)    UTI (lower urinary tract infection)    hx   White matter abnormality on  MRI of brain 02/23/2013    Family History: Family History  Problem Relation Age of Onset   Hypertension Mother    Diabetes Father    Brain cancer Father    Fibromyalgia Sister    Suicidality Sister    Hypertension Other        Cancer, Cerebrovascular disease run on mother side of family    Social History: Social History   Socioeconomic History   Marital status: Divorced    Spouse name: Not on file   Number of children: 3   Years of education: HS   Highest education level: 12th grade  Occupational History   Occupation: Merchandiser, retail: UNEMPLOYED    Comment: Disability  Tobacco Use   Smoking status: Never   Smokeless tobacco: Never  Vaping Use   Vaping Use: Never used  Substance and Sexual Activity   Alcohol use: No   Drug use: No   Sexual activity: Not on file  Other Topics Concern   Not on file  Social History Narrative   Patient is right-handed. She avoids caffeine. She has recently been using the treadmill.   Social Determinants of Health   Financial Resource Strain: Not on file  Food Insecurity: Not on file  Transportation Needs: Not on file  Physical Activity: Not on file  Stress: Not on file  Social Connections: Not on file  Intimate Partner Violence:  Not on file    Vital Signs: Blood pressure 136/90, pulse 84, temperature (!) 97.4 F (36.3 C), resp. rate 16, height 5\' 3"  (1.6 m), weight 229 lb 3.2 oz (104 kg), SpO2 96 %.  Examination: General Appearance: The patient is well-developed, well-nourished, and in no distress. Skin: Gross inspection of skin unremarkable. Head: normocephalic, no gross deformities. Eyes: no gross deformities noted. ENT: ears appear grossly normal no exudates. Neck: Supple. No thyromegaly. No LAD. Respiratory: norhonchi noted. Cardiovascular: Normal S1 and S2 without murmur or rub. Extremities: No cyanosis. pulses are equal. Neurologic: Alert and oriented. No involuntary movements.  LABS: No results found for this or any previous visit (from the past 2160 hour(s)).  Radiology: DG HIPS BILAT WITH PELVIS 3-4 VIEWS  Result Date: 12/30/2020 CLINICAL DATA:  Left hip pain. EXAM: DG HIP (WITH OR WITHOUT PELVIS) 3-4V BILAT COMPARISON:  None. FINDINGS: Mild enthesopathic changes associated with the greater trochanters bilaterally. Mild degenerative changes in the hips without loss of joint space and bony sclerosis. No other significant abnormalities. IMPRESSION: Mild enthesopathic changes at the greater trochanters bilaterally. Mild degenerative changes in the hips without loss of joint space or bony sclerosis. Electronically Signed   By: Dorise Bullion III M.D   On: 12/30/2020 08:14    No results found.  No results found.    Assessment and Plan: Patient Active Problem List   Diagnosis Date Noted   Lateral epicondylitis, right elbow 05/25/2019   Atherosclerosis of aorta (HCC) 10/28/2018   Elevated glucose 10/28/2018   Mixed hyperlipidemia 10/28/2018   Hypothyroidism 07/20/2018   LLQ pain 12/03/2017   Pre-diabetes 06/03/2017   Rectal bleeding 06/02/2017   S/P cervical spinal fusion 03/11/2016   Bilateral hand pain 11/29/2015   Elevated C-reactive protein 11/29/2015   Elevated rheumatoid factor  11/29/2015   Raynaud's phenomenon without gangrene 11/29/2015   Bloating 09/12/2014   Dizziness 08/08/2014   Peri-menopause 08/08/2014   Chronic tension-type headache, intractable 07/13/2014   Gastroesophageal reflux disease without esophagitis 07/12/2014   Anxiety 04/18/2014   Chronic arthritis 04/18/2014   Cystitis 04/18/2014  Morbid obesity ([place]) 04/18/2014   Fibromyalgia 04/18/2014   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[place]) 04/18/2014   Osteoporosis, 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   Arthropathy 04/18/2014   Cephalalgia 04/04/2014   Numbness and tingling 04/04/2014   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 02/23/2013   Nonspecific abnormal findings on radiological and other examination of skull and head 02/23/2013   Other nonspecific abnormal result of function study of brain and central nervous system 07/01/2012   Disturbance of skin sensation 07/01/2012   Migraine without aura 07/01/2012   Abdominal pain 02/11/2012   Chronic fatigue syndrome 02/11/2012   Chronic interstitial cystitis 02/11/2012   Dyspareunia 02/11/2012   Dysuria 02/11/2012   Female stress incontinence 02/11/2012   Irritable bowel syndrome with diarrhea 02/11/2012   Nausea and vomiting 02/11/2012   Nocturia 02/11/2012   Urge incontinence 02/11/2012   Urinary urgency 02/11/2012   Lumbar spondylosis 02/04/2012   Myalgia and myositis 02/04/2012   Depression 02/04/2012   Lumbosacral spondylosis without myelopathy 02/04/2012   OBESITY, UNSPECIFIED 10/03/2010   GENERALIZED ANXIETY DISORDER 10/03/2010   Chest pain 10/03/2010  1. SOB (shortness of breath) She is at her baseline spiro reviewed - Spirometry with Graph  2. Seasonal allergic rhinitis due to pollen Will retest to see if she has developed new allergies  3. BMI 40.0-44.9, adult (HCC) Obesity Counseling: Risk Assessment: An assessment of behavioral risk factors was made today and includes lack of exercise sedentary lifestyle, lack of portion control  and poor dietary habits.  Risk Modification Advice: She was counseled on portion control guidelines. Restricting daily caloric intake to 1500. The detrimental long term effects of obesity on her health and ongoing poor compliance was also discussed with the patient.    4. Moderate persistent asthma without complication Under controlled  5. OSA (obstructive sleep apnea) Try to improve her compliance for her CPAP    General Counseling: I have discussed the findings of the evaluation and examination with Levada Dy.  I have also discussed any further diagnostic evaluation thatmay be needed or ordered today. Leiya verbalizes understanding of the findings of todays visit. We also reviewed her medications today and discussed drug interactions and side effects including but not limited excessive drowsiness and altered mental states. We also discussed that there is always a risk not just to her but also people around her. she has been encouraged to call the office with any questions or concerns that should arise related to todays visit.  Orders Placed This Encounter  Procedures   Spirometry with Graph    Order Specific Question:   Where should this test be performed?    Answer:   [facility]    Order Specific Question:   Basic spirometry    Answer:   Yes     Time spent: 41  I have personally obtained a history, examined the patient, evaluated laboratory and imaging results, formulated the assessment and plan and placed orders.    Allyne Gee, MD [facility] Pulmonary and Critical Care Sleep medicine

## 2021-03-20 DIAGNOSIS — Z6839 Body mass index (BMI) 39.0-39.9, adult: Secondary | ICD-10-CM | POA: Diagnosis not present

## 2021-03-20 DIAGNOSIS — G43009 Migraine without aura, not intractable, without status migrainosus: Secondary | ICD-10-CM | POA: Diagnosis not present

## 2021-03-20 DIAGNOSIS — G521 Disorders of glossopharyngeal nerve: Secondary | ICD-10-CM | POA: Diagnosis not present

## 2021-03-20 DIAGNOSIS — E7800 Pure hypercholesterolemia, unspecified: Secondary | ICD-10-CM | POA: Diagnosis not present

## 2021-03-20 DIAGNOSIS — M810 Age-related osteoporosis without current pathological fracture: Secondary | ICD-10-CM | POA: Diagnosis not present

## 2021-03-20 DIAGNOSIS — M5481 Occipital neuralgia: Secondary | ICD-10-CM | POA: Diagnosis not present

## 2021-03-20 DIAGNOSIS — E559 Vitamin D deficiency, unspecified: Secondary | ICD-10-CM | POA: Diagnosis not present

## 2021-03-20 DIAGNOSIS — I708 Atherosclerosis of other arteries: Secondary | ICD-10-CM | POA: Diagnosis not present

## 2021-03-20 DIAGNOSIS — K219 Gastro-esophageal reflux disease without esophagitis: Secondary | ICD-10-CM | POA: Diagnosis not present

## 2021-03-20 DIAGNOSIS — I700 Atherosclerosis of aorta: Secondary | ICD-10-CM | POA: Diagnosis not present

## 2021-03-20 DIAGNOSIS — E119 Type 2 diabetes mellitus without complications: Secondary | ICD-10-CM | POA: Diagnosis not present

## 2021-03-21 ENCOUNTER — Encounter: Payer: Self-pay | Admitting: Internal Medicine

## 2021-03-21 NOTE — Telephone Encounter (Signed)
Can you please check with pt what she needs

## 2021-03-23 ENCOUNTER — Telehealth: Payer: Self-pay

## 2021-03-23 MED ORDER — ALBUTEROL SULFATE (2.5 MG/3ML) 0.083% IN NEBU: 2.5000 mg | INHALATION_SOLUTION | Freq: Four times a day (QID) | RESPIRATORY_TRACT | 1 refills | Status: DC | PRN

## 2021-03-23 NOTE — Telephone Encounter (Signed)
Sent message to Judson Roch with [facility] to order nebulizer, tubing supplies and medication for patient.

## 2021-03-23 NOTE — Addendum Note (Signed)
Addended by: Edd Arbour on: 03/23/2021 02:50 PM   Modules accepted: Orders

## 2021-03-27 ENCOUNTER — Telehealth: Payer: Self-pay

## 2021-03-27 NOTE — Telephone Encounter (Signed)
Faxed [facility] [employer] respiratory equipment form [phone]

## 2021-03-28 ENCOUNTER — Ambulatory Visit: Payer: Medicare Other | Admitting: Registered Nurse

## 2021-03-30 ENCOUNTER — Encounter: Payer: Medicare Other | Attending: Physical Medicine & Rehabilitation | Admitting: Registered Nurse

## 2021-03-30 ENCOUNTER — Other Ambulatory Visit (HOSPITAL_COMMUNITY): Payer: Self-pay

## 2021-03-30 ENCOUNTER — Other Ambulatory Visit: Payer: Self-pay

## 2021-03-30 ENCOUNTER — Encounter: Payer: Self-pay | Admitting: Registered Nurse

## 2021-03-30 ENCOUNTER — Encounter: Payer: Medicare Other | Admitting: Registered Nurse

## 2021-03-30 VITALS — BP 127/76 | HR 70 | Temp 98.0°F | Ht 63.0 in | Wt 231.0 lb

## 2021-03-30 DIAGNOSIS — M542 Cervicalgia: Secondary | ICD-10-CM | POA: Diagnosis not present

## 2021-03-30 DIAGNOSIS — G894 Chronic pain syndrome: Secondary | ICD-10-CM

## 2021-03-30 DIAGNOSIS — Z5181 Encounter for therapeutic drug level monitoring: Secondary | ICD-10-CM

## 2021-03-30 DIAGNOSIS — M797 Fibromyalgia: Secondary | ICD-10-CM | POA: Diagnosis not present

## 2021-03-30 DIAGNOSIS — M7062 Trochanteric bursitis, left hip: Secondary | ICD-10-CM | POA: Diagnosis not present

## 2021-03-30 DIAGNOSIS — M47816 Spondylosis without myelopathy or radiculopathy, lumbar region: Secondary | ICD-10-CM | POA: Diagnosis not present

## 2021-03-30 DIAGNOSIS — M5412 Radiculopathy, cervical region: Secondary | ICD-10-CM

## 2021-03-30 DIAGNOSIS — Z981 Arthrodesis status: Secondary | ICD-10-CM | POA: Diagnosis not present

## 2021-03-30 DIAGNOSIS — M5416 Radiculopathy, lumbar region: Secondary | ICD-10-CM | POA: Diagnosis not present

## 2021-03-30 DIAGNOSIS — Z79899 Other long term (current) drug therapy: Secondary | ICD-10-CM

## 2021-03-30 MED ORDER — TIZANIDINE HCL 2 MG PO TABS
2.0000 mg | ORAL_TABLET | Freq: Three times a day (TID) | ORAL | 2 refills | Status: DC | PRN
  Filled 2021-03-30: qty 50, 17d supply, fill #0
  Filled 2021-06-11: qty 50, 17d supply, fill #1
  Filled 2021-07-25: qty 50, 17d supply, fill #2

## 2021-03-30 MED ORDER — HYDROCODONE-ACETAMINOPHEN 10-325 MG PO TABS
1.0000 | ORAL_TABLET | Freq: Three times a day (TID) | ORAL | 0 refills | Status: DC | PRN
  Filled 2021-03-30: qty 90, 30d supply, fill #0

## 2021-03-30 NOTE — Progress Notes (Signed)
Subjective:    Patient ID: Karina Greer, female    DOB: [birth year], 55 y.o.   MRN: [id number]  HPI: Karina Greer is a 55 y.o. female who returns for follow up appointment for chronic pain and medication refill. She states her pain is located in her neck radiating into her right shoulder, lower back pain radiating into her left hip. She rates her pain 7. Her current exercise regime is walking and performing stretching exercises.   Karina Greer Morphine equivalent is 30.00  MME.  She  is also prescribed Clonazepam  by Dr. Melrose Nakayama .We have discussed the black box warning of using opioids and benzodiazepines. I highlighted the dangers of using these drugs together and discussed the adverse events including respiratory suppression, overdose, cognitive impairment and importance of compliance with current regimen. We will continue to monitor and adjust as indicated.     Last UDS was Performed on 12/04/2020, it was consistent.      Pain Inventory Average Pain 8 Pain Right Now 7 My pain is constant, sharp, tingling, and aching  In the last 24 hours, has pain interfered with the following? General activity 6 Relation with others 6 Enjoyment of life 6 What TIME of day is your pain at its worst? morning , daytime, evening, and night Sleep (in general) Fair  Pain is worse with: walking, bending, sitting, inactivity, standing, and some activites Pain improves with: rest, heat/ice, therapy/exercise, pacing activities, medication, and TENS Relief from Meds: 7  Family History  Problem Relation Age of Onset   Hypertension Mother    Diabetes Father    Brain cancer Father    Fibromyalgia Sister    Suicidality Sister    Hypertension Other        Cancer, Cerebrovascular disease run on mother side of family   Social History   Socioeconomic History   Marital status: Divorced    Spouse name: Not on file   Number of children: 3   Years of education: HS   Highest education level: 12th grade   Occupational History   Occupation: Merchandiser, retail: UNEMPLOYED    Comment: Disability  Tobacco Use   Smoking status: Never   Smokeless tobacco: Never  Vaping Use   Vaping Use: Never used  Substance and Sexual Activity   Alcohol use: No   Drug use: No   Sexual activity: Not on file  Other Topics Concern   Not on file  Social History Narrative   Patient is right-handed. She avoids caffeine. She has recently been using the treadmill.   Social Determinants of Health   Financial Resource Strain: Not on file  Food Insecurity: Not on file  Transportation Needs: Not on file  Physical Activity: Not on file  Stress: Not on file  Social Connections: Not on file   Past Surgical History:  Procedure Laterality Date   ACHILLES TENDON SURGERY Right 3/16   ANTERIOR CERVICAL DECOMP/DISCECTOMY FUSION N/A 03/11/2016   Procedure: C5-6, C6-7 Anterior Cervical Discectomy and Fusion, Allograft, Plate;  Surgeon: Marybelle Killings, MD;  Location: [place];  Service: Orthopedics;  Laterality: N/A;   CHOLECYSTECTOMY  2014   COLONOSCOPY WITH PROPOFOL N/A 08/25/2017   Procedure: COLONOSCOPY WITH PROPOFOL;  Surgeon: Manya Silvas, MD;  Location: [facility] ENDOSCOPY;  Service: Endoscopy;  Laterality: N/A;   CYSTO WITH HYDRODISTENSION N/A 01/26/2014   Procedure: CYSTOSCOPY/HYDRODISTENSION;  Surgeon: Bernestine Amass, MD;  Location: [facility];  Service: Urology;  Laterality: N/A;  CYSTO/  URETHRAL DILATION/  HYDRODISTENTION/   INSTILLATION THERAPY  07-16-2010//   12-30-2007//   10-27-2006   ELBOW SURGERY Right    EXERCISE TOLERENCE TEST  10-12-2010   NEGATIVE  ADEQUATE ETT/  NO ISCHEMIA OR EVIDENCE HIGH GRADE OBSTRUCTIVE CAD/  NO FURTHER TEST NEEDED   HYSTEROSCOPY W/  [place] ENDOMETRIAL ABLATION  2014   LAPAROSCOPIC OVARIAN CYST BX  2005   AND  URETEROSCOPIC LASER LITHO  STONE EXTRACTION   SHOULDER OPEN ROTATOR CUFF REPAIR Right 2004   TONSILLECTOMY     TRANSTHORACIC ECHOCARDIOGRAM   06-07-2006   normal study/  ef 60-65%   TUBAL LIGATION Bilateral 1995   Past Surgical History:  Procedure Laterality Date   ACHILLES TENDON SURGERY Right 3/16   ANTERIOR CERVICAL DECOMP/DISCECTOMY FUSION N/A 03/11/2016   Procedure: C5-6, C6-7 Anterior Cervical Discectomy and Fusion, Allograft, Plate;  Surgeon: Marybelle Killings, MD;  Location: [place];  Service: Orthopedics;  Laterality: N/A;   CHOLECYSTECTOMY  2014   COLONOSCOPY WITH PROPOFOL N/A 08/25/2017   Procedure: COLONOSCOPY WITH PROPOFOL;  Surgeon: Manya Silvas, MD;  Location: [facility] ENDOSCOPY;  Service: Endoscopy;  Laterality: N/A;   CYSTO WITH HYDRODISTENSION N/A 01/26/2014   Procedure: CYSTOSCOPY/HYDRODISTENSION;  Surgeon: Bernestine Amass, MD;  Location: [facility];  Service: Urology;  Laterality: N/A;   CYSTO/  URETHRAL DILATION/  HYDRODISTENTION/   INSTILLATION THERAPY  07-16-2010//   12-30-2007//   10-27-2006   ELBOW SURGERY Right    EXERCISE TOLERENCE TEST  10-12-2010   NEGATIVE  ADEQUATE ETT/  NO ISCHEMIA OR EVIDENCE HIGH GRADE OBSTRUCTIVE CAD/  NO FURTHER TEST NEEDED   HYSTEROSCOPY W/  [place] ENDOMETRIAL ABLATION  2014   LAPAROSCOPIC OVARIAN CYST BX  2005   AND  URETEROSCOPIC LASER LITHO  STONE EXTRACTION   SHOULDER OPEN ROTATOR CUFF REPAIR Right 2004   TONSILLECTOMY     TRANSTHORACIC ECHOCARDIOGRAM  06-07-2006   normal study/  ef 60-65%   TUBAL LIGATION Bilateral 1995   Past Medical History:  Diagnosis Date   Anxiety    Anxiety disorder    Arthritis    Blepharitis    Chronic low back pain    Depression    Diabetes mellitus without complication (HCC)    Dyslipidemia    Fibromyalgia    GERD (gastroesophageal reflux disease)    Headache    hx migraines   History of kidney stones    History of panic attacks    History of renal calculi    IBS (irritable bowel syndrome)    Interstitial cystitis    Lupus ([place])    tested positive for antibodies for lupus. dr to do further tests   Menorrhagia    OSA  on CPAP    not used cpap for several weeks   Pneumonia    hx   PONV (postoperative nausea and vomiting)    RLS (restless legs syndrome)    Rosacea    Seasonal asthma    SI (sacroiliac) joint dysfunction    SUI (stress urinary incontinence, female)    UTI (lower urinary tract infection)    hx   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 02/23/2013   BP 127/76   Pulse 70   Temp 98 F (36.7 C)   Ht 5\' 3"  (1.6 m)   Wt 231 lb (104.8 kg)   SpO2 95%   BMI 40.92 kg/m   Opioid Risk Score:   Fall Risk Score:  `1  Depression screen PHQ  2/9  Depression screen PHQ 2/9 12/04/2020 11/23/2020 09/11/2020 08/30/2020 08/14/2020 07/12/2020 04/14/2020  Decreased Interest 0 0 0 0 0 1 0  Down, Depressed, Hopeless 0 0 0 0 0 1 0  PHQ - 2 Score 0 0 0 0 0 2 0  Altered sleeping - - - - - - -  Tired, decreased energy - - - - - - -  Change in appetite - - - - - - -  Feeling bad or failure about yourself  - - - - - - -  Trouble concentrating - - - - - - -  Moving slowly or fidgety/restless - - - - - - -  Suicidal thoughts - - - - - - -  PHQ-9 Score - - - - - - -  Difficult doing work/chores - - - - - - -  Some recent data might be hidden    Review of Systems  Constitutional: Negative.   HENT: Negative.    Eyes: Negative.   Respiratory: Negative.    Cardiovascular: Negative.   Gastrointestinal: Negative.   Endocrine: Negative.   Musculoskeletal:  Positive for arthralgias, back pain, myalgias, neck pain and neck stiffness.  Skin: Negative.   Allergic/Immunologic: Negative.   Neurological:  Positive for numbness.       Tingling   Hematological: Negative.   Psychiatric/Behavioral: Negative.    All other systems reviewed and are negative.     Objective:   Physical Exam Vitals and nursing note reviewed.  Constitutional:      Appearance: Normal appearance.  Cardiovascular:     Rate and Rhythm: Normal rate and regular rhythm.     Pulses: Normal pulses.     Heart sounds: Normal heart sounds.   Pulmonary:     Effort: Pulmonary effort is normal.     Breath sounds: Normal breath sounds.  Musculoskeletal:     Cervical back: Normal range of motion and neck supple.     Comments: Normal Muscle Bulk and Muscle Testing Reveals:  Upper Extremities: Full ROM and Muscle Strength 5/5 Lumbar Paraspinal Tenderness: L-3-L-5 Left Greater Trochanter Tenderness Lower Extremities: Full ROM and Muscle Strength 5/5  Right Lower Extremity Flexion Produces Pain into Her Right Groin Left Lower Extremity Flexion Produces Paon into her Left Lower Extremity Arises from chair slowly Narrow Based Gait     Skin:    General: Skin is warm and dry.  Neurological:     Mental Status: She is alert and oriented to person, place, and time.  Psychiatric:        Mood and Affect: Mood normal.        Behavior: Behavior normal.         Assessment & Plan:  1. Lumbago/ Lumbar Spondylosis/ Left Lumbar Radiculitis: Continue to Monitor. 07/08/202. Refilled: HYDROcodone 10/325mg  one tablet every 8 hours as needed #90.  We will continue the opioid monitoring program, this consists of regular clinic visits, examinations, urine drug screen, pill counts as well as use of [place] Controlled Substance Reporting system. A 12 month History has been reviewed on the [place] on 03/30/2021. 2. Fibromyalgia. Continue Current exercise Regime. 03/30/2021 3. Anxiety and depression:Psychiatry Following: Counselor Jessica  at the [facility] . Continue Counseling at [facility]. 03/30/2021 4. Migraines: On Maxalt. Neurology Following. 03/30/2021 5. OSA : PCP Following. Continue to Monitor. 03/30/2021 6. Obesity: Following Healthy Diet Regimen and Continue HEP as Tolerated. 03/30/2021 7. Status Post Cervical Spinal Fusion: C5C6- C-6- C-7  Anterior Cervical Discectomy Fusion and Allograft Plate: Dr. Lorin Mercy Following. 03/30/2021 8. Cervicalgia/ Cervical Radiculitis/ S/P Cervical  Spinal Fusion:  Continue to Monitor Dr. Lorin Mercy Following. 03/30/2021 9. Muscle Spasm: Continue Tizanidine as needed.03/30/2021 10.Hereditary and Idiopathic Peripheral Neuropathy Continue with Tens Unit. 03/30/2021. 11.Bilateral  Greater Trochanteric Bursitis: Continue to Alternate Ice and heat Therapy. Continue to Monitor. 03/30/2021. 12. Chronic Bilateral Knee Pain: No Complaints Today. Continue HEP as Tolerated. Continue to Monitor. 03/30/2021 13. Lateral Epicondylitis of Right Elbow:  Ortho Following. S/P   06/26/2020 Right lateral epicondyle debridement, drilling and repair  By Dr Lorin Mercy. 14. Neuralgia/ Right Facial Droop: Neurology following. Continue to monitor. 03/30/2021. 15. Polyarthralgia: Continue HEP as tolerated. Continue to monitor. 03/30/2021   F/U in 1 month .

## 2021-04-02 ENCOUNTER — Ambulatory Visit: Payer: Medicare Other | Admitting: Registered Nurse

## 2021-04-19 ENCOUNTER — Ambulatory Visit (INDEPENDENT_AMBULATORY_CARE_PROVIDER_SITE_OTHER): Payer: Medicare Other | Admitting: Internal Medicine

## 2021-04-19 ENCOUNTER — Encounter: Payer: Self-pay | Admitting: Internal Medicine

## 2021-04-19 ENCOUNTER — Other Ambulatory Visit: Payer: Self-pay

## 2021-04-19 VITALS — BP 128/80 | HR 82 | Temp 97.8°F | Resp 16 | Ht 63.0 in | Wt 230.0 lb

## 2021-04-19 DIAGNOSIS — J301 Allergic rhinitis due to pollen: Secondary | ICD-10-CM

## 2021-04-19 NOTE — Progress Notes (Signed)
Pt had allergy testing today and results came back good.  Per DSK pt can either stop injections at this time or just do allergy injections once monthly.  Pt advised she will think about what she will do and get back with me on her decision.  I discussed the findings of the allergy testing with the patient at length and agree with the above Devona Konig MD

## 2021-04-19 NOTE — Procedures (Signed)
    [facility] Allergy MQT Recording Form  [facility] [address] [place], [place] [address] Phone [phone] Fax [phone]   Patient Name: Karina Greer Age: 55 y.o. Sex: female Date of Service: 04/19/2021   Performing Provider: Allyne Gee MD [facility]         Battery A Back   Site Antigen [facility] Flare  A1 Positive Control 10 40  A2 Negative Control 4 5  A3 American Elm 0 0  A4 Maple Box Elder 0 0  A5 Grass Mix 0 0  A6 Dock Sorrel Mix 0 0  A7 Russian Thistle 0 0  A8 Ragweed Mix 0 0  A9 English Pantain 0 0  A10 Oak Mix  0 0   Battery B Wheal Flare  B1 Lambs Quarters 0 0  B2 Cottonwood 0 0  B3 Pigweed Mix 0 0  B4 Acacia 0 0  B5 Pine Mix 0 0  B6 Privet 0 0  B7 White/Red Mulberry 0 0  B8 Western Water Hemp 0 0  B9 Guatemala Grass 0 0  B10 Melalucea 0 0   Battery C Wheal Flare  C1 Red River Birch 0 0  C2 Eastern Sycamore 0 0  C3 Bahai Grass 0 0  C4 American Beech 0 0  C5 Ash Mix 5 7  C6 Black Willow 0 0  C7 Hickory 0 0  C8 Black Walnut 0 0  C9 Red Cedar 0 0  C10 Sweet Gum  0 0   Battery D Wheal Flare  D1 Cultivated Oat 0 0  D2 Dog Fennel 7 10  D3 Common Mugwort 0 0  D4 Marsh Elder 0 0  D5 Johnson 0 0  D6 Hackberry Tree 0 0  D7 Bayberry Tree 0 0  D8 Cypress, Bald Tree 0 0  D9 Aspergillus Fumigatus 0 0  D10 Alternia  0 0   Battery E Wheal Flare  E1 Dreschlere 7 11  E2 Fusarium Mix 0 0  E3 Cladosporum Sph 0 0  E4 Bipolaris 0 0  E5 Penicillin chrys 0 0  E6 Cladosporum Herb 0 0  E7 Candida 0 0  E8 Aureobasidium 0 0  E9 Rhizopus 0 0  E10 Botrytis  0 0   Battery F Wheal Flare  F1 Aspergillus [place] 0 0  F2 Dust Mite Mix 0 0  F3 Cockroach Mix 0 0  F4 Cat Hair 0 0  F5 Dog Mixed breeds 0 0  F6 Feather Mix 0 0

## 2021-04-30 ENCOUNTER — Other Ambulatory Visit: Payer: Self-pay

## 2021-04-30 ENCOUNTER — Other Ambulatory Visit (HOSPITAL_COMMUNITY): Payer: Self-pay

## 2021-04-30 ENCOUNTER — Encounter: Payer: Medicare Other | Attending: Physical Medicine & Rehabilitation | Admitting: Registered Nurse

## 2021-04-30 ENCOUNTER — Encounter: Payer: Self-pay | Admitting: Registered Nurse

## 2021-04-30 VITALS — BP 139/84 | HR 66 | Temp 98.1°F | Ht 63.0 in | Wt 229.6 lb

## 2021-04-30 DIAGNOSIS — M542 Cervicalgia: Secondary | ICD-10-CM | POA: Diagnosis not present

## 2021-04-30 DIAGNOSIS — M47816 Spondylosis without myelopathy or radiculopathy, lumbar region: Secondary | ICD-10-CM | POA: Diagnosis not present

## 2021-04-30 DIAGNOSIS — Z981 Arthrodesis status: Secondary | ICD-10-CM | POA: Insufficient documentation

## 2021-04-30 DIAGNOSIS — M797 Fibromyalgia: Secondary | ICD-10-CM | POA: Insufficient documentation

## 2021-04-30 DIAGNOSIS — M7061 Trochanteric bursitis, right hip: Secondary | ICD-10-CM | POA: Diagnosis not present

## 2021-04-30 DIAGNOSIS — G894 Chronic pain syndrome: Secondary | ICD-10-CM | POA: Insufficient documentation

## 2021-04-30 DIAGNOSIS — M5416 Radiculopathy, lumbar region: Secondary | ICD-10-CM | POA: Diagnosis not present

## 2021-04-30 DIAGNOSIS — Z5181 Encounter for therapeutic drug level monitoring: Secondary | ICD-10-CM | POA: Diagnosis not present

## 2021-04-30 DIAGNOSIS — M5412 Radiculopathy, cervical region: Secondary | ICD-10-CM | POA: Insufficient documentation

## 2021-04-30 DIAGNOSIS — Z79899 Other long term (current) drug therapy: Secondary | ICD-10-CM | POA: Insufficient documentation

## 2021-04-30 DIAGNOSIS — M7062 Trochanteric bursitis, left hip: Secondary | ICD-10-CM | POA: Diagnosis not present

## 2021-04-30 MED ORDER — HYDROCODONE-ACETAMINOPHEN 10-325 MG PO TABS
1.0000 | ORAL_TABLET | Freq: Three times a day (TID) | ORAL | 0 refills | Status: DC | PRN
  Filled 2021-04-30: qty 90, 30d supply, fill #0

## 2021-04-30 NOTE — Progress Notes (Signed)
Subjective:    Patient ID: Karina Greer, female    DOB: [birth year], 55 y.o.   MRN: [id number]  HPI: Karina Greer is a 55 y.o. female who returns for follow up appointment for chronic pain and medication refill. She states her pain is located in her neck radiating into her right shoulder, lower back pain radiating into her right hip and right lower extremity. Karina Greer reports increase intensity and frequency of right hip pain.She requesting right hip injection, she will be scheduled for right hip injection with Dr Ranell Patrick, she verbalizes understanding. She rates her pain 8. Her current exercise regime is walking and performing stretching exercises.  Karina Greer Morphine equivalent is 30.00  MME.   Oral Swab was Performed today.      Pain Inventory Average Pain 7 Pain Right Now 8 My pain is constant, sharp, stabbing, tingling, and aching  In the last 24 hours, has pain interfered with the following? General activity 9 Relation with others 9 Enjoyment of life 9 What TIME of day is your pain at its worst? morning , daytime, evening, and night Sleep (in general) Fair  Pain is worse with: walking, bending, sitting, inactivity, standing, unsure, and some activites Pain improves with: rest, heat/ice, therapy/exercise, pacing activities, medication, TENS, and injections Relief from Meds: 7  Family History  Problem Relation Age of Onset   Hypertension Mother    Diabetes Father    Brain cancer Father    Fibromyalgia Sister    Suicidality Sister    Hypertension Other        Cancer, Cerebrovascular disease run on mother side of family   Social History   Socioeconomic History   Marital status: Divorced    Spouse name: Not on file   Number of children: 3   Years of education: HS   Highest education level: 12th grade  Occupational History   Occupation: Merchandiser, retail: UNEMPLOYED    Comment: Disability  Tobacco Use   Smoking status: Never   Smokeless tobacco: Never   Vaping Use   Vaping Use: Never used  Substance and Sexual Activity   Alcohol use: No   Drug use: No   Sexual activity: Not on file  Other Topics Concern   Not on file  Social History Narrative   Patient is right-handed. She avoids caffeine. She has recently been using the treadmill.   Social Determinants of Health   Financial Resource Strain: Not on file  Food Insecurity: Not on file  Transportation Needs: Not on file  Physical Activity: Not on file  Stress: Not on file  Social Connections: Not on file   Past Surgical History:  Procedure Laterality Date   ACHILLES TENDON SURGERY Right 3/16   ANTERIOR CERVICAL DECOMP/DISCECTOMY FUSION N/A 03/11/2016   Procedure: C5-6, C6-7 Anterior Cervical Discectomy and Fusion, Allograft, Plate;  Surgeon: Marybelle Killings, MD;  Location: [facility];  Service: Orthopedics;  Laterality: N/A;   CHOLECYSTECTOMY  2014   COLONOSCOPY WITH PROPOFOL N/A 08/25/2017   Procedure: COLONOSCOPY WITH PROPOFOL;  Surgeon: Manya Silvas, MD;  Location: [facility] ENDOSCOPY;  Service: Endoscopy;  Laterality: N/A;   CYSTO WITH HYDRODISTENSION N/A 01/26/2014   Procedure: CYSTOSCOPY/HYDRODISTENSION;  Surgeon: Bernestine Amass, MD;  Location: [facility];  Service: Urology;  Laterality: N/A;   CYSTO/  URETHRAL DILATION/  HYDRODISTENTION/   INSTILLATION THERAPY  07-16-2010//   12-30-2007//   10-27-2006   ELBOW SURGERY Right    EXERCISE TOLERENCE TEST  10-12-2010   NEGATIVE  ADEQUATE ETT/  NO ISCHEMIA OR EVIDENCE HIGH GRADE OBSTRUCTIVE CAD/  NO FURTHER TEST NEEDED   HYSTEROSCOPY W/  NOVASURE ENDOMETRIAL ABLATION  2014   LAPAROSCOPIC OVARIAN CYST BX  2005   AND  URETEROSCOPIC LASER LITHO  STONE EXTRACTION   SHOULDER OPEN ROTATOR CUFF REPAIR Right 2004   TONSILLECTOMY     TRANSTHORACIC ECHOCARDIOGRAM  06-07-2006   normal study/  ef 60-65%   TUBAL LIGATION Bilateral 1995   Past Surgical History:  Procedure Laterality Date   ACHILLES TENDON SURGERY Right 3/16    ANTERIOR CERVICAL DECOMP/DISCECTOMY FUSION N/A 03/11/2016   Procedure: C5-6, C6-7 Anterior Cervical Discectomy and Fusion, Allograft, Plate;  Surgeon: Marybelle Killings, MD;  Location: [facility];  Service: Orthopedics;  Laterality: N/A;   CHOLECYSTECTOMY  2014   COLONOSCOPY WITH PROPOFOL N/A 08/25/2017   Procedure: COLONOSCOPY WITH PROPOFOL;  Surgeon: Manya Silvas, MD;  Location: [facility] ENDOSCOPY;  Service: Endoscopy;  Laterality: N/A;   CYSTO WITH HYDRODISTENSION N/A 01/26/2014   Procedure: CYSTOSCOPY/HYDRODISTENSION;  Surgeon: Bernestine Amass, MD;  Location: [facility];  Service: Urology;  Laterality: N/A;   CYSTO/  URETHRAL DILATION/  HYDRODISTENTION/   INSTILLATION THERAPY  07-16-2010//   12-30-2007//   10-27-2006   ELBOW SURGERY Right    EXERCISE TOLERENCE TEST  10-12-2010   NEGATIVE  ADEQUATE ETT/  NO ISCHEMIA OR EVIDENCE HIGH GRADE OBSTRUCTIVE CAD/  NO FURTHER TEST NEEDED   HYSTEROSCOPY W/  [place] ENDOMETRIAL ABLATION  2014   LAPAROSCOPIC OVARIAN CYST BX  2005   AND  URETEROSCOPIC LASER LITHO  STONE EXTRACTION   SHOULDER OPEN ROTATOR CUFF REPAIR Right 2004   TONSILLECTOMY     TRANSTHORACIC ECHOCARDIOGRAM  06-07-2006   normal study/  ef 60-65%   TUBAL LIGATION Bilateral 1995   Past Medical History:  Diagnosis Date   Anxiety    Anxiety disorder    Arthritis    Blepharitis    Chronic low back pain    Depression    Diabetes mellitus without complication (HCC)    Dyslipidemia    Fibromyalgia    GERD (gastroesophageal reflux disease)    Headache    hx migraines   History of kidney stones    History of panic attacks    History of renal calculi    IBS (irritable bowel syndrome)    Interstitial cystitis    Lupus ([place])    tested positive for antibodies for lupus. dr to do further tests   Menorrhagia    OSA on CPAP    not used cpap for several weeks   Pneumonia    hx   PONV (postoperative nausea and vomiting)    RLS (restless legs syndrome)    Rosacea    Seasonal  asthma    SI (sacroiliac) joint dysfunction    SUI (stress urinary incontinence, female)    UTI (lower urinary tract infection)    hx   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 02/23/2013   BP 139/84   Pulse 66   Temp 98.1 F (36.7 C)   Ht '5\' 3"'$  (1.6 m)   Wt 229 lb 9.6 oz (104.1 kg)   SpO2 96%   BMI 40.67 kg/m   Opioid Risk Score:   Fall Risk Score:  `1  Depression screen PHQ 2/9  Depression screen [facility] 2/9 12/04/2020 11/23/2020 09/11/2020 08/30/2020 08/14/2020 07/12/2020 04/14/2020  Decreased Interest 0 0 0 0 0 1 0  Down, Depressed, Hopeless 0  0 0 0 0 1 0  PHQ - 2 Score 0 0 0 0 0 2 0  Altered sleeping - - - - - - -  Tired, decreased energy - - - - - - -  Change in appetite - - - - - - -  Feeling bad or failure about yourself  - - - - - - -  Trouble concentrating - - - - - - -  Moving slowly or fidgety/restless - - - - - - -  Suicidal thoughts - - - - - - -  PHQ-9 Score - - - - - - -  Some recent data might be hidden    Review of Systems  Musculoskeletal:        Pain all over the body, right hip hurting more  All other systems reviewed and are negative.     Objective:   Physical Exam Vitals and nursing note reviewed.  Constitutional:      Appearance: Normal appearance.  Cardiovascular:     Rate and Rhythm: Normal rate and regular rhythm.     Pulses: Normal pulses.     Heart sounds: Normal heart sounds.  Pulmonary:     Effort: Pulmonary effort is normal.     Breath sounds: Normal breath sounds.  Musculoskeletal:     Cervical back: Normal range of motion and neck supple.     Comments: Normal Muscle Bulk and Muscle Testing Reveals:  Upper Extremities: Full ROM and Muscle Strength  5/5 Right AC Joint Tenderness Lumbar Paraspinal Tenderness: L-3-L-5 Right Greater Trochanter Tenderness Lower Extremities: Full ROM and Muscle Strength 5/5 Arises from Chair slowly Antalgic  Gait     Skin:    General: Skin is warm and dry.  Neurological:     Mental Status: She is  alert and oriented to person, place, and time.  Psychiatric:        Mood and Affect: Mood normal.        Behavior: Behavior normal.         Assessment & Plan:  1. Lumbago/ Lumbar Spondylosis/ Left Lumbar Radiculitis: Continue to Monitor. 08/08/202. Refilled: HYDROcodone 10/'325mg'$  one tablet every 8 hours as needed #90.  We will continue the opioid monitoring program, this consists of regular clinic visits, examinations, urine drug screen, pill counts as well as use of [place] Controlled Substance Reporting system. A 12 month History has been reviewed on the [place] on 04/30/2021. 2. Fibromyalgia. Continue Current exercise Regime. 04/30/2021 3. Anxiety and depression:Psychiatry Following: Counselor Jessica  at the [facility] . Continue Counseling at [facility]. 04/30/2021 4. Migraines: On Maxalt. Neurology Following. 04/30/2021 5. OSA : PCP Following. Continue to Monitor. 04/30/2021 6. Obesity: Following Healthy Diet Regimen and Continue HEP as Tolerated. 03/30/2021 7. Status Post Cervical Spinal Fusion: C5C6- C-6- C-7 Anterior Cervical Discectomy Fusion and Allograft Plate: Dr. Lorin Mercy Following. 04/30/2021 8. Cervicalgia/ Cervical Radiculitis/ S/P Cervical Spinal Fusion:  Continue to Monitor Dr. Lorin Mercy Following. 04/30/2021 9. Muscle Spasm: Continue Tizanidine as needed.04/30/2021 10.Hereditary and Idiopathic Peripheral Neuropathy Continue with Tens Unit. 04/30/2021. 11.Bilateral  Greater Trochanteric Bursitis: R>L: Schedule for Right Trochanteric bursa injection with Dr Ranell Patrick. Continue to Alternate Ice and heat Therapy. Continue to Monitor. 04/30/2021. 12. Chronic Bilateral Knee Pain: No Complaints Today. Continue HEP as Tolerated. Continue to Monitor. 04/30/2021 13. Lateral Epicondylitis of Right Elbow:  Ortho Following. S/P   06/26/2020 Right lateral epicondyle debridement, drilling and repair  By Dr Lorin Mercy. 14. Neuralgia/ Right  Facial Droop: Neurology following. Continue to monitor. 04/30/2021. 15. Polyarthralgia: Continue HEP as tolerated. Continue to monitor. 04/30/2021   F/U in 1 month .

## 2021-05-01 ENCOUNTER — Encounter: Payer: Self-pay | Admitting: Internal Medicine

## 2021-05-05 ENCOUNTER — Other Ambulatory Visit: Payer: Self-pay | Admitting: Internal Medicine

## 2021-05-08 ENCOUNTER — Telehealth: Payer: Self-pay

## 2021-05-08 NOTE — Telephone Encounter (Signed)
Per Tat, pt is approved for allergy serums to be mixed and to do allergy injections.  Pt's insurance will need to be re-checked at beginning of the year 2023

## 2021-05-09 ENCOUNTER — Encounter: Payer: Medicare Other | Admitting: Physical Medicine and Rehabilitation

## 2021-05-10 LAB — DRUG TOX MONITOR 1 W/CONF, ORAL FLD
Amphetamines: NEGATIVE ng/mL (ref <10)
Barbiturates: NEGATIVE ng/mL (ref <10)
Benzodiazepines: NEGATIVE ng/mL (ref <0.50)
Buprenorphine: NEGATIVE ng/mL (ref <0.10)
Cocaine: NEGATIVE ng/mL (ref <5.0)
Codeine: NEGATIVE ng/mL (ref <2.5)
Dihydrocodeine: 3.4 ng/mL — ABNORMAL HIGH (ref <2.5)
Fentanyl: NEGATIVE ng/mL (ref <0.10)
Heroin Metabolite: NEGATIVE ng/mL (ref <1.0)
Hydrocodone: 33 ng/mL — ABNORMAL HIGH (ref <2.5)
Hydromorphone: NEGATIVE ng/mL (ref <2.5)
MARIJUANA: NEGATIVE ng/mL (ref <2.5)
MDMA: NEGATIVE ng/mL (ref <10)
Meprobamate: NEGATIVE ng/mL (ref <2.5)
Methadone: NEGATIVE ng/mL (ref <5.0)
Morphine: NEGATIVE ng/mL (ref <2.5)
Nicotine Metabolite: NEGATIVE ng/mL (ref <5.0)
Norhydrocodone: NEGATIVE ng/mL (ref <2.5)
Noroxycodone: NEGATIVE ng/mL (ref <2.5)
Opiates: POSITIVE ng/mL — AB (ref <2.5)
Oxycodone: NEGATIVE ng/mL (ref <2.5)
Oxymorphone: NEGATIVE ng/mL (ref <2.5)
Phencyclidine: NEGATIVE ng/mL (ref <10)
Tapentadol: NEGATIVE ng/mL (ref <5.0)
Tramadol: NEGATIVE ng/mL (ref <5.0)
Zolpidem: NEGATIVE ng/mL (ref <5.0)

## 2021-05-10 LAB — DRUG TOX ALC METAB W/CON, ORAL FLD: Alcohol Metabolite: NEGATIVE ng/mL (ref <25)

## 2021-05-12 DIAGNOSIS — J301 Allergic rhinitis due to pollen: Secondary | ICD-10-CM | POA: Diagnosis not present

## 2021-05-14 ENCOUNTER — Ambulatory Visit (INDEPENDENT_AMBULATORY_CARE_PROVIDER_SITE_OTHER): Payer: Medicare Other

## 2021-05-14 ENCOUNTER — Other Ambulatory Visit: Payer: Self-pay

## 2021-05-14 ENCOUNTER — Telehealth: Payer: Self-pay | Admitting: *Deleted

## 2021-05-14 DIAGNOSIS — J301 Allergic rhinitis due to pollen: Secondary | ICD-10-CM

## 2021-05-14 NOTE — Telephone Encounter (Signed)
Oral swab drug screen was consistent for prescribed medications.  ?

## 2021-05-24 ENCOUNTER — Other Ambulatory Visit: Payer: Self-pay | Admitting: Internal Medicine

## 2021-05-25 ENCOUNTER — Other Ambulatory Visit (HOSPITAL_COMMUNITY): Payer: Self-pay

## 2021-05-25 ENCOUNTER — Encounter: Payer: Self-pay | Admitting: Registered Nurse

## 2021-05-25 ENCOUNTER — Encounter: Payer: Medicare Other | Attending: Physical Medicine & Rehabilitation | Admitting: Registered Nurse

## 2021-05-25 ENCOUNTER — Other Ambulatory Visit: Payer: Self-pay

## 2021-05-25 VITALS — BP 141/80 | HR 76 | Temp 98.8°F | Ht 63.0 in | Wt 227.8 lb

## 2021-05-25 DIAGNOSIS — M5416 Radiculopathy, lumbar region: Secondary | ICD-10-CM | POA: Insufficient documentation

## 2021-05-25 DIAGNOSIS — Z981 Arthrodesis status: Secondary | ICD-10-CM | POA: Insufficient documentation

## 2021-05-25 DIAGNOSIS — M542 Cervicalgia: Secondary | ICD-10-CM | POA: Diagnosis not present

## 2021-05-25 DIAGNOSIS — G894 Chronic pain syndrome: Secondary | ICD-10-CM | POA: Insufficient documentation

## 2021-05-25 DIAGNOSIS — M47816 Spondylosis without myelopathy or radiculopathy, lumbar region: Secondary | ICD-10-CM | POA: Insufficient documentation

## 2021-05-25 DIAGNOSIS — M7061 Trochanteric bursitis, right hip: Secondary | ICD-10-CM | POA: Diagnosis not present

## 2021-05-25 DIAGNOSIS — M797 Fibromyalgia: Secondary | ICD-10-CM | POA: Diagnosis not present

## 2021-05-25 DIAGNOSIS — Z79899 Other long term (current) drug therapy: Secondary | ICD-10-CM | POA: Insufficient documentation

## 2021-05-25 DIAGNOSIS — R112 Nausea with vomiting, unspecified: Secondary | ICD-10-CM | POA: Diagnosis not present

## 2021-05-25 DIAGNOSIS — Z5181 Encounter for therapeutic drug level monitoring: Secondary | ICD-10-CM | POA: Insufficient documentation

## 2021-05-25 MED ORDER — HYDROCODONE-ACETAMINOPHEN 10-325 MG PO TABS
1.0000 | ORAL_TABLET | Freq: Three times a day (TID) | ORAL | 0 refills | Status: DC | PRN
  Filled 2021-05-25 – 2021-05-29 (×2): qty 90, 30d supply, fill #0

## 2021-05-25 NOTE — Progress Notes (Signed)
Subjective:    Patient ID: Karina Greer, female    DOB: [birth year], 55 y.o.   MRN: [id number]  HPI: Karina Greer is a 55 y.o. female who returns for follow up appointment for chronic pain and medication refill. She states her pain is located in her neck, lower back and right hip.She  rates her pain 5. Her current exercise regime is walking and performing stretching exercises.  [name] Morphine equivalent is 30.00 MME.  She is also prescribed Clonazepam  by Dr. Melrose Nakayama .We have discussed the black box warning of using opioids and benzodiazepines. I highlighted the dangers of using these drugs together and discussed the adverse events including respiratory suppression, overdose, cognitive impairment and importance of compliance with current regimen. We will continue to monitor and adjust as indicated.     Last Oral Swab was Performed on 04/30/2021, it was consistent.     Pain Inventory Average Pain 9 Pain Right Now 5 My pain is constant, sharp, tingling, and aching  In the last 24 hours, has pain interfered with the following? General activity 4 Relation with others 4 Enjoyment of life 4 What TIME of day is your pain at its worst? morning , daytime, evening, and night Sleep (in general) Fair  Pain is worse with: walking, bending, sitting, inactivity, standing, unsure, and some activites Pain improves with: rest, heat/ice, therapy/exercise, pacing activities, medication, TENS, and injections Relief from Meds: 7  Family History  Problem Relation Age of Onset   Hypertension Mother    Diabetes Father    Brain cancer Father    Fibromyalgia Sister    Suicidality Sister    Hypertension Other        Cancer, Cerebrovascular disease run on mother side of family   Social History   Socioeconomic History   Marital status: Divorced    Spouse name: Not on file   Number of children: 3   Years of education: HS   Highest education level: 12th grade  Occupational History    Occupation: Merchandiser, retail: UNEMPLOYED    Comment: Disability  Tobacco Use   Smoking status: Never   Smokeless tobacco: Never  Vaping Use   Vaping Use: Never used  Substance and Sexual Activity   Alcohol use: No   Drug use: No   Sexual activity: Not on file  Other Topics Concern   Not on file  Social History Narrative   Patient is right-handed. She avoids caffeine. She has recently been using the treadmill.   Social Determinants of Health   Financial Resource Strain: Not on file  Food Insecurity: Not on file  Transportation Needs: Not on file  Physical Activity: Not on file  Stress: Not on file  Social Connections: Not on file   Past Surgical History:  Procedure Laterality Date   ACHILLES TENDON SURGERY Right 3/16   ANTERIOR CERVICAL DECOMP/DISCECTOMY FUSION N/A 03/11/2016   Procedure: C5-6, C6-7 Anterior Cervical Discectomy and Fusion, Allograft, Plate;  Surgeon: Karina Greer;  Location: [facility];  Service: Orthopedics;  Laterality: N/A;   CHOLECYSTECTOMY  2014   COLONOSCOPY WITH PROPOFOL N/A 08/25/2017   Procedure: COLONOSCOPY WITH PROPOFOL;  Surgeon: Karina Greer;  Location[facility] END[facility]COPY;  Service: Endoscopy;  Laterality: N/A;   CYSTO WITH HYDRODISTENSION N/A 01/26/2014   Procedure: CYSTOSCOPY/HYDRODISTENSION;  Surgeon: Karina Greer;  Location: [facility];  Service: Urology;  Laterality: N/A;   CYSTO/  URETHRAL DILATION/  HYDRODISTENTION/  INSTILLATION THERAPY  07-16-2010//   12-30-2007//   10-27-2006   ELBOW SURGERY Right    EXERCISE TOLERENCE TEST  10-12-2010   NEGATIVE  ADEQUATE ETT/  NO ISCHEMIA OR EVIDENCE HIGH GRADE OBSTRUCTIVE CAD/  NO FURTHER TEST NEEDED   HYSTEROSCOPY W/  NOVASURE ENDOMETRIAL ABLATION  2014   LAPAROSCOPIC OVARIAN CYST BX  2005   AND  URETEROSCOPIC LASER LITHO  STONE EXTRACTION   SHOULDER OPEN ROTATOR CUFF REPAIR Right 2004   TONSILLECTOMY     TRANSTHORACIC ECHOCARDIOGRAM  06-07-2006   normal study/  ef  60-65%   TUBAL LIGATION Bilateral 1995   Past Surgical History:  Procedure Laterality Date   ACHILLES TENDON SURGERY Right 3/16   ANTERIOR CERVICAL DECOMP/DISCECTOMY FUSION N/A 03/11/2016   Procedure: C5-6, C6-7 Anterior Cervical Discectomy and Fusion, Allograft, Plate;  Surgeon: Karina Greer;  Location: [facility];  Service: Orthopedics;  Laterality: N/A;   CHOLECYSTECTOMY  2014   COLONOSCOPY WITH PROPOFOL N/A 08/25/2017   Procedure: COLONOSCOPY WITH PROPOFOL;  Surgeon: Karina Greer;  Location: [facility] ENDOSCOPY;  Service: Endoscopy;  Laterality: N/A;   CYSTO WITH HYDRODISTENSION N/A 01/26/2014   Procedure: CYSTOSCOPY/HYDRODISTENSION;  Surgeon: Karina Greer;  Location: [facility];  Service: Urology;  Laterality: N/A;   CYSTO/  URETHRAL DILATION/  HYDRODISTENTION/   INSTILLATION THERAPY  07-16-2010//   12-30-2007//   10-27-2006   ELBOW SURGERY Right    EXERCISE TOLERENCE TEST  10-12-2010   NEGATIVE  ADEQUATE ETT/  NO ISCHEMIA OR EVIDENCE HIGH GRADE OBSTRUCTIVE CAD/  NO FURTHER TEST NEEDED   HYSTEROSCOPY W/  [place] ENDOMETRIAL ABLATION  2014   LAPAROSCOPIC OVARIAN CYST BX  2005   AND  URETEROSCOPIC LASER LITHO  STONE EXTRACTION   SHOULDER OPEN ROTATOR CUFF REPAIR Right 2004   TONSILLECTOMY     TRANSTHORACIC ECHOCARDIOGRAM  06-07-2006   normal study/  ef 60-65%   TUBAL LIGATION Bilateral 1995   Past Medical History:  Diagnosis Date   Anxiety    Anxiety disorder    Arthritis    Blepharitis    Chronic low back pain    Depression    Diabetes mellitus without complication (HCC)    Dyslipidemia    Fibromyalgia    GERD (gastroesophageal reflux disease)    Headache    hx migraines   History of kidney stones    History of panic attacks    History of renal calculi    IBS (irritable bowel syndrome)    Interstitial cystitis    Lupus ([place])    tested positive for antibodies for lupus. dr to do further tests   Menorrhagia    OSA on CPAP    not used cpap for  several weeks   Pneumonia    hx   PONV (postoperative nausea and vomiting)    RLS (restless legs syndrome)    Rosacea    Seasonal asthma    SI (sacroiliac) joint dysfunction    SUI (stress urinary incontinence, female)    UTI (lower urinary tract infection)    hx   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 02/23/2013   BP (!) 141/80   Pulse 76   Temp 98.8 F (37.1 C)   Ht '5\' 3"'$  (1.6 m)   Wt 227 lb 12.8 oz (103.3 kg)   SpO2 95%   BMI 40.35 kg/m   Opioid Risk Score:   Fall Risk Score:  `1  Depression screen PHQ 2/9  Depression screen PHQ  2/9 05/25/2021 04/30/2021 12/04/2020 11/23/2020 09/11/2020 08/30/2020 08/14/2020  Decreased Interest 0 0 0 0 0 0 0  Down, Depressed, Hopeless 0 0 0 0 0 0 0  PHQ - 2 Score 0 0 0 0 0 0 0  Altered sleeping - - - - - - -  Tired, decreased energy - - - - - - -  Change in appetite - - - - - - -  Feeling bad or failure about yourself  - - - - - - -  Trouble concentrating - - - - - - -  Moving slowly or fidgety/restless - - - - - - -  Suicidal thoughts - - - - - - -  PHQ-9 Score - - - - - - -  Some recent data might be hidden    Review of Systems  Musculoskeletal:  Positive for arthralgias and back pain.       Pain all over the body  All other systems reviewed and are negative.     Objective:   Physical Exam        Assessment & Plan:  1. Lumbago/ Lumbar Spondylosis/ Left Lumbar Radiculitis: Continue to Monitor. 09/02/202. Refilled: HYDROcodone 10/'325mg'$  one tablet every 8 hours as needed #90.  We will continue the opioid monitoring program, this consists of regular clinic visits, examinations, urine drug screen, pill counts as well as use of [place] Controlled Substance Reporting system. A 12 month History has been reviewed on the [place] on 05/25/2021. 2. Fibromyalgia. Continue Current exercise Regime. 05/25/2021 3. Anxiety and depression:Psychiatry Following: Counselor Jessica  at the [facility] . Continue Counseling at [facility]. 05/25/2021 4. Migraines: On Maxalt. Neurology Following. 05/25/2021 5. OSA : PCP Following. Continue to Monitor. 05/25/2021 6. Obesity: She has a scheduled appointmne twith Dr Ranell Patrick this month. Following Healthy Diet Regimen and Continue HEP as Tolerated. 05/25/2021 7. Status Post Cervical Spinal Fusion: C5C6- C-6- C-7 Anterior Cervical Discectomy Fusion and Allograft Plate: Dr. Lorin Mercy Following. 05/25/2021 8. Cervicalgia/ Cervical Radiculitis/ S/P Cervical Spinal Fusion:  Continue to Monitor Dr. Lorin Mercy Following. 05/25/2021 9. 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 05/25/2021. 11.Right   Greater Trochanteric Bursitis: . Continue to Alternate Ice and heat Therapy. Continue to Monitor. 05/25/2021. 12. Chronic Bilateral Knee Pain: No Complaints Today. Continue HEP as Tolerated. Continue to Monitor. 05/25/2021 13. Lateral Epicondylitis of Right Elbow: No complaints today.  Ortho Following. S/P   06/26/2020 Right lateral epicondyle debridement, drilling and repair  By Dr Lorin Mercy. 14. Neuralgia/ Right Facial Droop: Neurology following. Continue to monitor. 05/25/2021. 15. Polyarthralgia: No complaints today. Continue HEP as tolerated. Continue to monitor. 05/25/2021   F/U in 1 month .

## 2021-05-29 ENCOUNTER — Other Ambulatory Visit: Payer: Self-pay

## 2021-05-29 ENCOUNTER — Other Ambulatory Visit (HOSPITAL_COMMUNITY): Payer: Self-pay

## 2021-05-29 ENCOUNTER — Ambulatory Visit (INDEPENDENT_AMBULATORY_CARE_PROVIDER_SITE_OTHER): Payer: Medicare Other

## 2021-05-29 DIAGNOSIS — J301 Allergic rhinitis due to pollen: Secondary | ICD-10-CM | POA: Diagnosis not present

## 2021-05-29 MED ORDER — EPINEPHRINE 0.3 MG/0.3ML IJ SOAJ: INTRAMUSCULAR | 1 refills | Status: DC

## 2021-05-30 DIAGNOSIS — U071 COVID-19: Secondary | ICD-10-CM | POA: Diagnosis not present

## 2021-05-30 DIAGNOSIS — R0789 Other chest pain: Secondary | ICD-10-CM | POA: Diagnosis not present

## 2021-05-30 DIAGNOSIS — Z6838 Body mass index (BMI) 38.0-38.9, adult: Secondary | ICD-10-CM | POA: Diagnosis not present

## 2021-05-30 DIAGNOSIS — J019 Acute sinusitis, unspecified: Secondary | ICD-10-CM | POA: Diagnosis not present

## 2021-06-07 DIAGNOSIS — M797 Fibromyalgia: Secondary | ICD-10-CM | POA: Diagnosis not present

## 2021-06-07 DIAGNOSIS — W57XXXA Bitten or stung by nonvenomous insect and other nonvenomous arthropods, initial encounter: Secondary | ICD-10-CM | POA: Diagnosis not present

## 2021-06-07 DIAGNOSIS — K2930 Chronic superficial gastritis without bleeding: Secondary | ICD-10-CM | POA: Diagnosis not present

## 2021-06-07 DIAGNOSIS — R1084 Generalized abdominal pain: Secondary | ICD-10-CM | POA: Diagnosis not present

## 2021-06-07 DIAGNOSIS — K580 Irritable bowel syndrome with diarrhea: Secondary | ICD-10-CM | POA: Diagnosis not present

## 2021-06-07 DIAGNOSIS — R110 Nausea: Secondary | ICD-10-CM | POA: Diagnosis not present

## 2021-06-07 DIAGNOSIS — E739 Lactose intolerance, unspecified: Secondary | ICD-10-CM | POA: Diagnosis not present

## 2021-06-11 ENCOUNTER — Other Ambulatory Visit (HOSPITAL_COMMUNITY): Payer: Self-pay

## 2021-06-11 ENCOUNTER — Ambulatory Visit: Payer: Medicare Other | Admitting: Internal Medicine

## 2021-06-13 DIAGNOSIS — Z7689 Persons encountering health services in other specified circumstances: Secondary | ICD-10-CM | POA: Diagnosis not present

## 2021-06-13 DIAGNOSIS — R0789 Other chest pain: Secondary | ICD-10-CM | POA: Diagnosis not present

## 2021-06-18 ENCOUNTER — Other Ambulatory Visit: Payer: Self-pay | Admitting: *Deleted

## 2021-06-18 ENCOUNTER — Other Ambulatory Visit: Payer: Self-pay

## 2021-06-18 ENCOUNTER — Encounter (HOSPITAL_BASED_OUTPATIENT_CLINIC_OR_DEPARTMENT_OTHER): Payer: Medicare Other | Admitting: Physical Medicine and Rehabilitation

## 2021-06-18 ENCOUNTER — Other Ambulatory Visit (HOSPITAL_COMMUNITY): Payer: Self-pay

## 2021-06-18 ENCOUNTER — Ambulatory Visit: Payer: Medicare Other | Admitting: Physical Medicine and Rehabilitation

## 2021-06-18 VITALS — BP 125/85 | HR 62 | Temp 98.0°F | Ht 63.0 in | Wt 225.8 lb

## 2021-06-18 DIAGNOSIS — M542 Cervicalgia: Secondary | ICD-10-CM | POA: Diagnosis not present

## 2021-06-18 DIAGNOSIS — M797 Fibromyalgia: Secondary | ICD-10-CM | POA: Diagnosis not present

## 2021-06-18 DIAGNOSIS — R112 Nausea with vomiting, unspecified: Secondary | ICD-10-CM | POA: Diagnosis not present

## 2021-06-18 DIAGNOSIS — M5416 Radiculopathy, lumbar region: Secondary | ICD-10-CM | POA: Diagnosis not present

## 2021-06-18 DIAGNOSIS — Z981 Arthrodesis status: Secondary | ICD-10-CM | POA: Diagnosis not present

## 2021-06-18 DIAGNOSIS — M47816 Spondylosis without myelopathy or radiculopathy, lumbar region: Secondary | ICD-10-CM | POA: Diagnosis not present

## 2021-06-18 DIAGNOSIS — M7061 Trochanteric bursitis, right hip: Secondary | ICD-10-CM | POA: Diagnosis not present

## 2021-06-18 MED ORDER — FREESTYLE LIBRE 2 READER DEVI: 1.0000 | Freq: Every day | 3 refills | Status: DC

## 2021-06-18 MED ORDER — HYDROCODONE-ACETAMINOPHEN 10-325 MG PO TABS
1.0000 | ORAL_TABLET | Freq: Three times a day (TID) | ORAL | 0 refills | Status: DC | PRN
  Filled 2021-06-18 – 2021-06-27 (×2): qty 90, 30d supply, fill #0

## 2021-06-18 MED ORDER — FREESTYLE LIBRE 2 READER DEVI
1.0000 | Freq: Every day | 3 refills | Status: DC
  Filled 2021-06-18: qty 1, 28d supply, fill #0

## 2021-06-18 MED ORDER — FREESTYLE LIBRE 2 SENSOR MISC: 1.0000 | Freq: Every day | 3 refills | Status: DC

## 2021-06-18 MED ORDER — FREESTYLE LIBRE 2 SENSOR MISC
1.0000 | Freq: Every day | 3 refills | Status: DC
  Filled 2021-06-18: qty 2, 28d supply, fill #0
  Filled 2021-06-18: qty 3, 30d supply, fill #0

## 2021-06-18 NOTE — Addendum Note (Signed)
Addended by: Izora Ribas on: 06/18/2021 10:19 AM   Modules accepted: Orders

## 2021-06-18 NOTE — Telephone Encounter (Signed)
Lean called and [facility] pharmacy cannot fill the freestyle libre and needs the order to go to [facility] [place] so they can bill it under medicare. I have resent the [facility] to [facility].

## 2021-06-18 NOTE — Patient Instructions (Addendum)
Weight loss: -Educated that current weight is 225lbs and current BMI is 40.00 -Educated regarding health benefits of weight loss- for pain, general health, chronic disease prevention, immune health, mental health.  -Will monitor weight every visit.  -Consider [place] tea daily.  -Discussed the benefits of intermittent fasting. -Discussed foods that can assist in weight loss: 1) leafy greens- high in fiber and nutrients 2) dark chocolate- improves metabolism (if prefer sweetened, best to sweeten with honey instead of sugar).  3) cruciferous vegetables- high in fiber and protein 4) full fat yogurt: high in healthy fat, protein, calcium, and probiotics 5) apples- high in a variety of phytochemicals 6) nuts- high in fiber and protein that increase feelings of fullness 7) grapefruit: rich in nutrients, antioxidants, and fiber (not to be taken with anticoagulation) 8) beans- high in protein and fiber 9) salmon- has high quality protein and healthy fats 10) green tea- rich in polyphenols 11) eggs- rich in choline and vitamin D 12) tuna- high protein, boosts metabolism 13) avocado- decreases visceral abdominal fat 14) chicken (pasture raised): high in protein and iron 15) blueberries- reduce abdominal fat and cholesterol 16) whole grains- decreases calories retained during digestion, speeds metabolism 17) chia seeds- curb appetite 18) chilies- increases fat metabolism  -Discussed supplements that can be used:  1) Metatrim 400mg  BID 30 minutes before breakfast and dinner  2) Sphaeranthus indicus and Garcinia mangostana (combinations of these and #1 can be found in capsicum and zychrome   Teas: Green tea Black tea Oolong tea White tea [place] [employer] [place], [place]  3) green coffee bean extract 400mg  twice per day or Irvingia (african mango) 150 to 300mg  twice per day.   [employer] probiotic- spores, Bacillus.

## 2021-06-18 NOTE — Progress Notes (Signed)
Subjective:    Patient ID: Karina Greer, female    DOB: 1966-02-21, 55 y.o.   MRN: [id number]  HPI: Karina Greer is a 55 y.o. female with fibromyalgia who presents for nutritional counseling regarding her pain and medical conditions.   -She has trouble eating in the morning. Sometimes she does not eat until lunch. She has been trying to do the fasting we discussed last visit.  -Her son is 278 pounds and 6 foot tall. He went to 242 lbs following a similar diet.  -Her dietary goals are to make use of what she eats.  -she has been eating a lot of salads.  -she cannot handle hard cheeses.  -she has been having a problem eating hamburger.  -Last night she made four pieces of chicken and put on some tomato sauce and red peppers, she sprinkled some cheese, added broccoli, olive oil, garlic, butter.  -she is planned to have her HgbA1c and lipid panel drawn by her PCP next week.     Pain Inventory Average Pain 8 Pain Right Now 6 My pain is constant, stabbing, tingling, and aching  In the last 24 hours, has pain interfered with the following? General activity 8 Relation with others 8 Enjoyment of life 8 What TIME of day is your pain at its worst? morning , daytime, evening, and night Sleep (in general) Fair  Pain is worse with: walking, bending, sitting, inactivity, standing, unsure, and some activites Pain improves with: rest, heat/ice, therapy/exercise, pacing activities, medication, TENS, and injections Relief from Meds: 6  Family History  Problem Relation Age of Onset   Hypertension Mother    Diabetes Father    Brain cancer Father    Fibromyalgia Sister    Suicidality Sister    Hypertension Other        Cancer, Cerebrovascular disease run on mother side of family   Social History   Socioeconomic History   Marital status: Divorced    Spouse name: Not on file   Number of children: 3   Years of education: HS   Highest education level: 12th grade  Occupational History    Occupation: Merchandiser, retail: UNEMPLOYED    Comment: Disability  Tobacco Use   Smoking status: Never   Smokeless tobacco: Never  Vaping Use   Vaping Use: Never used  Substance and Sexual Activity   Alcohol use: No   Drug use: No   Sexual activity: Not on file  Other Topics Concern   Not on file  Social History Narrative   Patient is right-handed. She avoids caffeine. She has recently been using the treadmill.   Social Determinants of Health   Financial Resource Strain: Not on file  Food Insecurity: Not on file  Transportation Needs: Not on file  Physical Activity: Not on file  Stress: Not on file  Social Connections: Not on file   Past Surgical History:  Procedure Laterality Date   ACHILLES TENDON SURGERY Right 3/16   ANTERIOR CERVICAL DECOMP/DISCECTOMY FUSION N/A 03/11/2016   Procedure: C5-6, C6-7 Anterior Cervical Discectomy and Fusion, Allograft, Plate;  Surgeon: Marybelle Killings, MD;  Location: [place];  Service: Orthopedics;  Laterality: N/A;   CHOLECYSTECTOMY  2014   COLONOSCOPY WITH PROPOFOL N/A 08/25/2017   Procedure: COLONOSCOPY WITH PROPOFOL;  Surgeon: Manya Silvas, MD;  Location: [facility] END[facility];  Service: Endoscopy;  Laterality: N/A;   CYSTO WITH HYDRODISTENSION N/A 01/26/2014   Procedure: CYSTOSCOPY/HYDRODISTENSION;  Surgeon: Bernestine Amass, MD;  Location:  [place];  Service: Urology;  Laterality: N/A;   CYSTO/  URETHRAL DILATION/  HYDRODISTENTION/   INSTILLATION THERAPY  07-16-2010//   12-30-2007//   10-27-2006   ELBOW SURGERY Right    EXERCISE TOLERENCE TEST  10-12-2010   NEGATIVE  ADEQUATE ETT/  NO ISCHEMIA OR EVIDENCE HIGH GRADE OBSTRUCTIVE CAD/  NO FURTHER TEST NEEDED   HYSTEROSCOPY W/  NOVASURE ENDOMETRIAL ABLATION  2014   LAPAROSCOPIC OVARIAN CYST BX  2005   AND  URETEROSCOPIC LASER LITHO  STONE EXTRACTION   SHOULDER OPEN ROTATOR CUFF REPAIR Right 2004   TONSILLECTOMY     TRANSTHORACIC ECHOCARDIOGRAM  06-07-2006   normal study/   ef 60-65%   TUBAL LIGATION Bilateral 1995   Past Surgical History:  Procedure Laterality Date   ACHILLES TENDON SURGERY Right 3/16   ANTERIOR CERVICAL DECOMP/DISCECTOMY FUSION N/A 03/11/2016   Procedure: C5-6, C6-7 Anterior Cervical Discectomy and Fusion, Allograft, Plate;  Surgeon: Marybelle Killings, MD;  Location: [place];  Service: Orthopedics;  Laterality: N/A;   CHOLECYSTECTOMY  2014   COLONOSCOPY WITH PROPOFOL N/A 08/25/2017   Procedure: COLONOSCOPY WITH PROPOFOL;  Surgeon: Manya Silvas, MD;  Location: [facility] END[facility]COPY;  Service: Endoscopy;  Laterality: N/A;   CYSTO WITH HYDRODISTENSION N/A 01/26/2014   Procedure: CYSTOSCOPY/HYDRODISTENSION;  Surgeon: Bernestine Amass, MD;  Location: [facility];  Service: Urology;  Laterality: N/A;   CYSTO/  URETHRAL DILATION/  HYDRODISTENTION/   INSTILLATION THERAPY  07-16-2010//   12-30-2007//   10-27-2006   ELBOW SURGERY Right    EXERCISE TOLERENCE TEST  10-12-2010   NEGATIVE  ADEQUATE ETT/  NO ISCHEMIA OR EVIDENCE HIGH GRADE OBSTRUCTIVE CAD/  NO FURTHER TEST NEEDED   HYSTEROSCOPY W/  [place] ENDOMETRIAL ABLATION  2014   LAPAROSCOPIC OVARIAN CYST BX  2005   AND  URETEROSCOPIC LASER LITHO  STONE EXTRACTION   SHOULDER OPEN ROTATOR CUFF REPAIR Right 2004   TONSILLECTOMY     TRANSTHORACIC ECHOCARDIOGRAM  06-07-2006   normal study/  ef 60-65%   TUBAL LIGATION Bilateral 1995   Past Medical History:  Diagnosis Date   Anxiety    Anxiety disorder    Arthritis    Blepharitis    Chronic low back pain    Depression    Diabetes mellitus without complication (HCC)    Dyslipidemia    Fibromyalgia    GERD (gastroesophageal reflux disease)    Headache    hx migraines   History of kidney stones    History of panic attacks    History of renal calculi    IBS (irritable bowel syndrome)    Interstitial cystitis    Lupus ([place])    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 dr to do further tests   Menorrhagia    OSA on CPAP    not used cpap  for several weeks   Pneumonia    hx   PONV (postoperative nausea and vomiting)    RLS (restless legs syndrome)    Rosacea    Seasonal asthma    SI (sacroiliac) joint dysfunction    SUI (stress urinary incontinence, female)    UTI (lower urinary tract infection)    hx   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 02/23/2013   BP 125/85   Pulse 62   Temp 98 F (36.7 C) (Oral)   Ht 5\' 3"  (1.6 m)   Wt 225 lb 12.8 oz (102.4 kg)   SpO2 97%   BMI 40.00 kg/m  Opioid Risk Score:   Fall Risk Score:  `1  Depression screen PHQ 2/9  Depression screen [facility] 2/9 05/25/2021 04/30/2021 12/04/2020 11/23/2020 09/11/2020 08/30/2020 08/14/2020  Decreased Interest 0 0 0 0 0 0 0  Down, Depressed, Hopeless 0 0 0 0 0 0 0  PHQ - 2 Score 0 0 0 0 0 0 0  Altered sleeping - - - - - - -  Tired, decreased energy - - - - - - -  Change in appetite - - - - - - -  Feeling bad or failure about yourself  - - - - - - -  Trouble concentrating - - - - - - -  Moving slowly or fidgety/restless - - - - - - -  Suicidal thoughts - - - - - - -  PHQ-9 Score - - - - - - -  Some recent data might be hidden    Review of Systems  Musculoskeletal:  Positive for arthralgias and back pain.       Pain all over the body  All other systems reviewed and are negative.     Objective:   Physical Exam  Gen: no distress, normal appearing, BMI 40.00, weight 225lbs HEENT: oral mucosa pink and moist, NCAT Cardio: Reg rate Chest: normal effort, normal rate of breathing Abd: soft, non-distended Ext: no edema Psych: pleasant, normal affect Skin: intact Neuro: Alert and oriented x3, sometimes loses train of thought.     Assessment & Plan:  1. Lumbago/ Lumbar Spondylosis/ Left Lumbar Radiculitis: Continue to Monitor. 09/02/202. Refilled HYDROcodone 10/325mg  one tablet every 8 hours as needed #90.  We will continue the opioid monitoring program, this consists of regular clinic visits, examinations, urine drug screen, pill counts as well  as use of [place] Controlled Substance Reporting system. A 12 month History has been reviewed on the [place] on 05/25/2021. 2. Fibromyalgia. Continue Current exercise Regime. 05/25/2021. Discussed qua therapy.  3. Anxiety and depression:Psychiatry Following: Counselor Jessica  at the [place] . Continue Counseling at [facility]. 05/25/2021 4. Migraines: On Maxalt. Neurology Following. 05/25/2021 5. OSA : PCP Following. Continue to Monitor. 05/25/2021 6. Obesity:  -continue to avoid bread -counseled regarding the benefits of intermittent fasting and improving insulin sensitivity.  Obesity: -Educated that current weight is 225 lbs and current BMI is 40.00 -Educated regarding health benefits of weight loss- for pain, general health, chronic disease prevention, immune health, mental health.  -Will monitor weight every visit.  -Continue [place] tea daily.  -Discussed the benefits of intermittent fasting. -Discussed foods that can assist in weight loss: 1) leafy greens- high in fiber and nutrients 2) dark chocolate- improves metabolism (if prefer sweetened, best to sweeten with honey instead of sugar).  3) cruciferous vegetables- high in fiber and protein 4) full fat yogurt: high in healthy fat, protein, calcium, and probiotics 5) apples- high in a variety of phytochemicals 6) nuts- high in fiber and protein that increase feelings of fullness 7) grapefruit: rich in nutrients, antioxidants, and fiber (not to be taken with anticoagulation) 8) beans- high in protein and fiber 9) salmon- has high quality protein and healthy fats 10) green tea- rich in polyphenols 11) eggs- rich in choline and vitamin D 12) tuna- high protein, boosts metabolism 13) avocado- decreases visceral abdominal fat 14) chicken (pasture raised): high in protein and iron 15) blueberries- reduce abdominal fat and cholesterol 16) whole grains- decreases calories  retained during digestion, speeds metabolism 17) chia  seeds- curb appetite 18) chilies- increases fat metabolism  -Discussed supplements that can be used:  1) Metatrim 400mg  BID 30 minutes before breakfast and dinner  2) Sphaeranthus indicus and Garcinia mangostana (combinations of these and #1 can be found in capsicum and zychrome  3) green coffee bean extract 400mg  twice per day or Irvingia (african mango) 150 to 300mg  twice per day. Teas: Green tea Black tea Oolong tea White tea [place] [employer] Tea, [place] Continue to avoid added sugar. Can use honey or monk fruit as sweeteners.   7. Status Post Cervical Spinal Fusion: C5C6- C-6- C-7 Anterior Cervical Discectomy Fusion and Allograft Plate: Dr. Lorin Mercy Following. 05/25/2021 8. Cervicalgia/ Cervical Radiculitis/ S/P Cervical Spinal Fusion:  Continue to Monitor Dr. Lorin Mercy Following. 05/25/2021 9. 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 05/25/2021. 11.Right   Greater Trochanteric Bursitis: . Continue to Alternate Ice and heat Therapy. Continue to Monitor. 05/25/2021. 12. Chronic Bilateral Knee Pain: No Complaints Today. Continue HEP as Tolerated. Continue to Monitor. 05/25/2021 13. Lateral Epicondylitis of Right Elbow: No complaints today.  Ortho Following. S/P   06/26/2020 Right lateral epicondyle debridement, drilling and repair  By Dr Lorin Mercy. 14. Neuralgia/ Right Facial Droop: Neurology following. Continue to monitor. 05/25/2021. 15. Polyarthralgia: No complaints today. Continue HEP as tolerated. Continue to monitor. 05/25/2021 16. Nausea: discussed trying an elimination diet avoiding gluten, dairy, and eggs for 4 weeks.    F/U w/ Zella Ball in 1 month, me in 3 months.

## 2021-06-19 ENCOUNTER — Ambulatory Visit: Payer: Medicare Other

## 2021-06-19 ENCOUNTER — Telehealth: Payer: Self-pay

## 2021-06-19 NOTE — Telephone Encounter (Signed)
[facility] sent fax stating missing information. Need clarification: Medicare Part B requires a diagnosis code for Continuous Blood Gluc Sensor and Receiver

## 2021-06-21 DIAGNOSIS — R0789 Other chest pain: Secondary | ICD-10-CM | POA: Diagnosis not present

## 2021-06-25 ENCOUNTER — Other Ambulatory Visit: Payer: Self-pay

## 2021-06-25 ENCOUNTER — Encounter: Payer: Self-pay | Admitting: Internal Medicine

## 2021-06-25 ENCOUNTER — Ambulatory Visit (INDEPENDENT_AMBULATORY_CARE_PROVIDER_SITE_OTHER): Payer: Medicare Other | Admitting: Internal Medicine

## 2021-06-25 ENCOUNTER — Ambulatory Visit: Payer: Medicare Other | Admitting: Registered Nurse

## 2021-06-25 ENCOUNTER — Encounter: Payer: Medicare Other | Admitting: Registered Nurse

## 2021-06-25 VITALS — BP 140/80 | HR 69 | Temp 98.1°F | Resp 16 | Ht 63.0 in | Wt 228.0 lb

## 2021-06-25 DIAGNOSIS — R0602 Shortness of breath: Secondary | ICD-10-CM

## 2021-06-25 DIAGNOSIS — Z6841 Body Mass Index (BMI) 40.0 and over, adult: Secondary | ICD-10-CM | POA: Diagnosis not present

## 2021-06-25 DIAGNOSIS — J301 Allergic rhinitis due to pollen: Secondary | ICD-10-CM

## 2021-06-25 NOTE — Patient Instructions (Signed)
Allergic Rhinitis, Adult Allergic rhinitis is an allergic reaction that affects the mucous membrane inside the nose. The mucous membrane is the tissue that produces mucus. There are two types of allergic rhinitis: Seasonal. This type is also called hay fever and happens only during certain seasons. Perennial. This type can happen at any time of the year. Allergic rhinitis cannot be spread from person to person. This condition can be mild, moderate, or severe. It can develop at any age and may be outgrown. What are the causes? This condition is caused by allergens. These are things that can cause an allergic reaction. Allergens may differ for seasonal allergic rhinitis and perennial allergic rhinitis. Seasonal allergic rhinitis is triggered by pollen. Pollen can come from grasses, trees, and weeds. Perennial allergic rhinitis may be triggered by: Dust mites. Proteins in a pet's urine, saliva, or dander. Dander is dead skin cells from a pet. Smoke, mold, or car fumes. What increases the risk? You are more likely to develop this condition if you have a family history of allergies or other conditions related to allergies, including: Allergic conjunctivitis. This is inflammation of parts of the eyes and eyelids. Asthma. This condition affects the lungs and makes it hard to breathe. Atopic dermatitis or eczema. This is long term (chronic) inflammation of the skin. Food allergies. What are the signs or symptoms? Symptoms of this condition include: Sneezing or coughing. A stuffy nose (nasal congestion), itchy nose, or nasal discharge. Itchy eyes and tearing of the eyes. A feeling of mucus dripping down the back of your throat (postnasal drip). Trouble sleeping. Tiredness or fatigue. Headache. Sore throat. How is this diagnosed? This condition may be diagnosed with your symptoms, medical history, and physical exam. Your health care provider may check for related conditions, such  as: Asthma. Pink eye. This is eye inflammation caused by infection (conjunctivitis). Ear infection. Upper respiratory infection. This is an infection in the nose, throat, or upper airways. You may also have tests to find out which allergens trigger your symptoms. These may include skin tests or blood tests. How is this treated? There is no cure for this condition, but treatment can help control symptoms. Treatment may include: Taking medicines that block allergy symptoms, such as corticosteroids and antihistamines. Medicine may be given as a shot, nasal spray, or pill. Avoiding any allergens. Being exposed again and again to tiny amounts of allergens to help you build a defense against allergens (immunotherapy). This is done if other treatments have not helped. It may include: Allergy shots. These are injected medicines that have small amounts of allergen in them. Sublingual immunotherapy. This involves taking small doses of a medicine with allergen in it under your tongue. If these treatments do not work, your health care provider may prescribe newer, stronger medicines. Follow these instructions at home: Avoiding allergens Find out what you are allergic to and avoid those allergens. These are some things you can do to help avoid allergens: If you have perennial allergies: Replace carpet with wood, tile, or vinyl flooring. Carpet can trap dander and dust. Do not smoke. Do not allow smoking in your home. Change your heating and air conditioning filters at least once a month. If you have seasonal allergies, take these steps during allergy season: Keep windows closed as much as possible. Plan outdoor activities when pollen counts are lowest. Check pollen counts before you plan outdoor activities. When coming indoors, change clothing and shower before sitting on furniture or bedding. If you have a pet in  the house that produces allergens: Keep the pet out of the bedroom. Vacuum, sweep, and  dust regularly. General instructions Take over-the-counter and prescription medicines only as told by your health care provider. Drink enough fluid to keep your urine pale yellow. Keep all follow-up visits as told by your health care provider. This is important. Where to find more information American Academy of Allergy, Asthma & Immunology: www.aaaai.org Contact a health care provider if: You have a fever. You develop a cough that does not go away. You make whistling sounds when you breathe (wheeze). Your symptoms slow you down or stop you from doing your normal activities each day. Get help right away if: You have shortness of breath. This symptom may represent a serious problem that is an emergency. Do not wait to see if the symptom will go away. Get medical help right away. Call your local emergency services (911 in the [place] Do not drive yourself to the hospital. Summary Allergic rhinitis may be managed by taking medicines as directed and avoiding allergens. If you have seasonal allergies, keep windows closed as much as possible during allergy season. Contact your health care provider if you develop a fever or a cough that does not go away. This information is not intended to replace advice given to you by your health care provider. Make sure you discuss any questions you have with your health care provider. Document Revised: 10/29/2019 Document Reviewed: 09/07/2019 Elsevier Patient Education  2022 [employer].

## 2021-06-25 NOTE — Progress Notes (Signed)
[facility] [place], [place] [address]  Pulmonary Sleep Medicine   Office Visit Note  Patient Name: Karina Greer DOB: 1966-08-24 MRN [id number]  Date of Service: 06/25/2021  Complaints/HPI: Allergy test follow up. Patient has had improvement with the allergies.  We reviewed the results of the test as previously discussed.  She has been doing better with her symptoms and would like to continue with allergy shots as prescribed.  Sleep apnea was also discussed during this visit.  She seems to be doing well continue to work on compliance with CPAP therapy  ROS  General: (-) fever, (-) chills, (-) night sweats, (-) weakness Skin: (-) rashes, (-) itching,. Eyes: (-) visual changes, (-) redness, (-) itching. Nose and Sinuses: (-) nasal stuffiness or itchiness, (-) postnasal drip, (-) nosebleeds, (-) sinus trouble. Mouth and Throat: (-) sore throat, (-) hoarseness. Neck: (-) swollen glands, (-) enlarged thyroid, (-) neck pain. Respiratory: + cough, (-) bloody sputum, - shortness of breath, - wheezing. Cardiovascular: - ankle swelling, (-) chest pain. Lymphatic: (-) lymph node enlargement. Neurologic: (-) numbness, (-) tingling. Psychiatric: (-) anxiety, (-) depression   Current Medication: Outpatient Encounter Medications as of 06/25/2021  Medication Sig Note   ACCU-CHEK GUIDE test strip USE 1 (ONE) EACH DAILY FOR GLUCOSE MONITORING    albuterol (PROVENTIL) (2.5 MG/3ML) 0.083% nebulizer solution USE 1 VIAL IN NEBULIZER EVERY 6 HOURS    albuterol (VENTOLIN HFA) 108 (90 Base) MCG/ACT inhaler TAKE 2 PUFFS BY MOUTH EVERY 4 TO 6 HOURS AS NEEDED    azelastine (ASTELIN) 0.1 % nasal spray USE 2 SPRAYS INTO EACH NOSTRIL TWICE A DAY 06/30/2019: Prn    b complex vitamins tablet Take 1 tablet by mouth daily.    calcitonin, salmon, (MIACALCIN/FORTICAL) 200 UNIT/ACT nasal spray Place 1 spray into alternate nostrils daily.     chlorhexidine (PERIDEX) 0.12 % solution  SMARTSIG:By Mouth    Cholecalciferol (VITAMIN D) 50 MCG (2000 UT) CAPS Take 1 capsule by mouth once a week.    clonazePAM (KLONOPIN) 0.5 MG tablet Take 0.25 mg by mouth 2 (two) times daily.    Continuous Blood Gluc Receiver (FREESTYLE LIBRE 2 READER) DEVI Use as directed daily    Continuous Blood Gluc Sensor (FREESTYLE LIBRE 2 SENSOR) MISC Use as directed daily.    DEXILANT 60 MG capsule Take 1 capsule by mouth daily as needed.    dicyclomine (BENTYL) 10 MG capsule Take 10 mg by mouth 2 (two) times daily as needed.    EPINEPHrine (EPIPEN 2-PAK) 0.3 mg/0.3 mL IJ SOAJ injection Use as directed    fexofenadine (ALLEGRA) 180 MG tablet Take 180 mg by mouth daily.    FLOVENT HFA 110 MCG/ACT inhaler INHALE 1 PUFF BY MOUTH TWICE A DAY    HYDROcodone-acetaminophen (NORCO) 10-325 MG tablet Take 1 tablet by mouth every 8 (eight) hours as needed for pain.    hydrocortisone valerate cream (WESTCORT) 0.2 % Apply topically 2 (two) times daily.    ketoconazole (NIZORAL) 2 % shampoo Apply 1 application topically 2 (two) times a week.     meclizine (ANTIVERT) 25 MG tablet Take 1 tablet by mouth 3 (three) times daily as needed.    ondansetron (ZOFRAN-ODT) 8 MG disintegrating tablet Take 8 mg by mouth 3 (three) times daily.    PRESCRIPTION MEDICATION every 14 (fourteen) days. 2 ALLERGY INJECTIONS EVERY 2 WKS    rizatriptan (MAXALT-MLT) 10 MG disintegrating tablet SMARTSIG:1 Tablet(s) By Mouth 1 to 2 Times Daily  SPIRULINA PO Take 1 capsule by mouth daily.    tiZANidine (ZANAFLEX) 2 MG tablet Take 1 tablet (2 mg total) by mouth every 8 (eight) hours as needed.    valACYclovir (VALTREX) 1000 MG tablet Take by mouth.    Vitamin D, Ergocalciferol, (DRISDOL) 1.25 MG (50000 UNIT) CAPS capsule Take 1 capsule by mouth 2 (two) times a week.    [DISCONTINUED] gabapentin (NEURONTIN) 100 MG capsule Take 200 mg by mouth 2 (two) times daily. (Patient not taking: Reported on 06/25/2021)    [DISCONTINUED] rosuvastatin (CRESTOR)  5 MG tablet Take 5 mg by mouth daily. (Patient not taking: Reported on 06/25/2021)    No facility-administered encounter medications on file as of 06/25/2021.    Surgical History: Past Surgical History:  Procedure Laterality Date   ACHILLES TENDON SURGERY Right 3/16   ANTERIOR CERVICAL DECOMP/DISCECTOMY FUSION N/A 03/11/2016   Procedure: C5-6, C6-7 Anterior Cervical Discectomy and Fusion, Allograft, Plate;  Surgeon: Marybelle Killings, MD;  Location: [place];  Service: Orthopedics;  Laterality: N/A;   CHOLECYSTECTOMY  2014   COLONOSCOPY WITH PROPOFOL N/A 08/25/2017   Procedure: COLONOSCOPY WITH PROPOFOL;  Surgeon: Manya Silvas, MD;  Location: [facility] ENDOSCOPY;  Service: Endoscopy;  Laterality: N/A;   CYSTO WITH HYDRODISTENSION N/A 01/26/2014   Procedure: CYSTOSCOPY/HYDRODISTENSION;  Surgeon: Bernestine Amass, MD;  Location: [facility];  Service: Urology;  Laterality: N/A;   CYSTO/  URETHRAL DILATION/  HYDRODISTENTION/   INSTILLATION THERAPY  07-16-2010//   12-30-2007//   10-27-2006   ELBOW SURGERY Right    EXERCISE TOLERENCE TEST  10-12-2010   NEGATIVE  ADEQUATE ETT/  NO ISCHEMIA OR EVIDENCE HIGH GRADE OBSTRUCTIVE CAD/  NO FURTHER TEST NEEDED   HYSTEROSCOPY W/  [place] ENDOMETRIAL ABLATION  2014   LAPAROSCOPIC OVARIAN CYST BX  2005   AND  URETEROSCOPIC LASER LITHO  STONE EXTRACTION   SHOULDER OPEN ROTATOR CUFF REPAIR Right 2004   TONSILLECTOMY     TRANSTHORACIC ECHOCARDIOGRAM  06-07-2006   normal study/  ef 60-65%   TUBAL LIGATION Bilateral 1995    Medical History: Past Medical History:  Diagnosis Date   Anxiety    Anxiety disorder    Arthritis    Blepharitis    Chronic low back pain    Depression    Diabetes mellitus without complication (HCC)    Dyslipidemia    Fibromyalgia    GERD (gastroesophageal reflux disease)    Headache    hx migraines   History of kidney stones    History of panic attacks    History of renal calculi    IBS (irritable bowel syndrome)     Interstitial cystitis    Lupus ([place])    tested positive for antibodies for lupus. 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    Family History: Family History  Problem Relation Age of Onset   Hypertension Mother    Diabetes Father    Brain cancer Father    Fibromyalgia Sister    Suicidality Sister  Hypertension Other        Cancer, Cerebrovascular disease run on mother side of family    Social History: Social History   Socioeconomic History   Marital status: Divorced    Spouse name: Not on file   Number of children: 3   Years of education: HS   Highest education level: 12th grade  Occupational History   Occupation: Merchandiser, retail: UNEMPLOYED    Comment: Disability  Tobacco Use   Smoking status: Never   Smokeless tobacco: Never  Vaping Use   Vaping Use: Never used  Substance and Sexual Activity   Alcohol use: No   Drug use: No   Sexual activity: Not on file  Other Topics Concern   Not on file  Social History Narrative   Patient is right-handed. She avoids caffeine. She has recently been using the treadmill.   Social Determinants of Health   Financial Resource Strain: Not on file  Food Insecurity: Not on file  Transportation Needs: Not on file  Physical Activity: Not on file  Stress: Not on file  Social Connections: Not on file  Intimate Partner Violence: Not on file    Vital Signs: Blood pressure 140/80, pulse 69, temperature 98.1 F (36.7 C), resp. rate 16, height 5\' 3"  (1.6 m), weight 228 lb (103.4 kg), SpO2 98 %.  Examination: General Appearance: The patient is well-developed, well-nourished,  and in no distress. Skin: Gross inspection of skin unremarkable. Head: normocephalic, no gross deformities. Eyes: no gross deformities noted. ENT: ears appear grossly normal no exudates. Neck: Supple. No thyromegaly. No LAD. Respiratory: no rhonchi noted. Cardiovascular: Normal S1 and S2 without murmur or rub. Extremities: No cyanosis. pulses are equal. Neurologic: Alert and oriented. No involuntary movements.  LABS: Recent Results (from the past 2160 hour(s))  Drug Tox Alc Metab w/Con, Oral Fld     Status: None   Collection Time: 04/30/21 11:06 AM  Result Value Ref Range   Alcohol Metabolite NEGATIVE <25 ng/mL    Comment: . For additional information, please refer to http://education.questdiagnostics.com/faq/FAQ183 (This link is being provided for informational/ educational purposes only.) . This drug testing is for medical treatment only. Analysis was performed as non-forensic testing and these results should be used only by healthcare providers to render diagnosis or treatment, or to monitor progress of medical conditions. . For assistance with interpreting these drug results, please contact a [employer] Toxicology Specialist: [phone] [place] [phone]), M-F, 8am-6pm EST. [name] These tests were developed and their analytical performance characteristics have been determined by [facility]. They have not been cleared or approved by the FDA. These assays have been validated pursuant to the CLIA regulations and are used for clinical purposes.   Drug Tox Monitor 1 w/Conf, Oral Fld     Status: Abnormal   Collection Time: 04/30/21 11:06 AM  Result Value Ref Range   Amphetamines NEGATIVE <10 ng/mL   Barbiturates NEGATIVE <10 ng/mL   Benzodiazepines NEGATIVE <0.50 ng/mL   Buprenorphine NEGATIVE <0.10 ng/mL   Cocaine NEGATIVE <5.0 ng/mL   Fentanyl NEGATIVE <0.10 ng/mL   Heroin Metabolite NEGATIVE <1.0 ng/mL   MARIJUANA NEGATIVE <2.5 ng/mL   MDMA NEGATIVE  <10 ng/mL   Meprobamate NEGATIVE <2.5 ng/mL   Methadone NEGATIVE <5.0 ng/mL   Nicotine Metabolite NEGATIVE <5.0 ng/mL   Opiates POSITIVE (A) <2.5 ng/mL   Codeine Negative <2.5 ng/mL   Dihydrocodeine 3.4 (H) <2.5 ng/mL    Comment: . Dihydrocodeine is a minor metabolite of hydrocodone as well  as a prescribed drug.    Hydrocodone 33.0 (H) <2.5 ng/mL    Comment: . Hydrocodone is a minor metabolite of codeine as well as prescribed drug. Hydrocodone is a pharmaceutical impurity of oxycodone (up to 1%). [name]    Hydromorphone Negative <2.5 ng/mL   Morphine Negative <2.5 ng/mL   Norhydrocodone Negative <2.5 ng/mL   Noroxycodone Negative <2.5 ng/mL   Oxycodone Negative <2.5 ng/mL   Oxymorphone Negative <2.5 ng/mL   Phencyclidine NEGATIVE <10 ng/mL   Tapentadol NEGATIVE <5.0 ng/mL   Tramadol NEGATIVE <5.0 ng/mL   Zolpidem NEGATIVE <5.0 ng/mL    Comment: . For additional information, please refer to http://education.[facility].com/faq/FAQ186 (This link is being provided for informational/ educational purposes only.) . This drug testing is for medical treatment only. Analysis was performed as non-forensic testing and these results should be used only by healthcare providers to render diagnosis or treatment, or to monitor progress of medical conditions. . For assistance with interpreting these drug results, please contact a [employer] Toxicology Specialist: [phone] [place] [phone]), M-F, 8am-6pm EST. [name] These tests were developed and their analytical performance characteristics have been determined by [facility]. They have not been cleared or approved by the FDA. These assays have been validated pursuant to the CLIA regulations and are used for clinical purposes.     Radiology: DG HIPS BILAT WITH PELVIS 3-4 VIEWS  Result Date: 12/30/2020 CLINICAL DATA:  Left hip pain. EXAM: DG HIP (WITH OR WITHOUT PELVIS) 3-4V BILAT COMPARISON:  None. FINDINGS: Mild  enthesopathic changes associated with the greater trochanters bilaterally. Mild degenerative changes in the hips without loss of joint space and bony sclerosis. No other significant abnormalities. IMPRESSION: Mild enthesopathic changes at the greater trochanters bilaterally. Mild degenerative changes in the hips without loss of joint space or bony sclerosis. Electronically Signed   By: Dorise Bullion III M.D   On: 12/30/2020 08:14    No results found.  No results found.    Assessment and Plan: Patient Active Problem List   Diagnosis Date Noted   Lateral epicondylitis, right elbow 05/25/2019   Atherosclerosis of aorta (HCC) 10/28/2018   Elevated glucose 10/28/2018   Mixed hyperlipidemia 10/28/2018   Hypothyroidism 07/20/2018   LLQ pain 12/03/2017   Pre-diabetes 06/03/2017   Rectal bleeding 06/02/2017   S/P cervical spinal fusion 03/11/2016   Bilateral hand pain 11/29/2015   Elevated C-reactive protein 11/29/2015   Elevated rheumatoid factor 11/29/2015   Raynaud's phenomenon without gangrene 11/29/2015   Bloating 09/12/2014   Dizziness 08/08/2014   Peri-menopause 08/08/2014   Chronic tension-type headache, intractable 07/13/2014   Gastroesophageal reflux disease without esophagitis 07/12/2014   Anxiety 04/18/2014   Chronic arthritis 04/18/2014   Cystitis 04/18/2014   Morbid obesity ([place]) 04/18/2014   Fibromyalgia 04/18/2014   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[place]) 04/18/2014   Osteoporosis, 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   Arthropathy 04/18/2014   Cephalalgia 04/04/2014   Numbness and tingling 04/04/2014   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 02/23/2013   Nonspecific abnormal findings on radiological and other examination of skull and head 02/23/2013   Other nonspecific abnormal result of function study of brain and central nervous system 07/01/2012   Disturbance of skin sensation 07/01/2012   Migraine without aura 07/01/2012   Abdominal pain 02/11/2012   Chronic fatigue  syndrome 02/11/2012   Chronic interstitial cystitis 02/11/2012   Dyspareunia 02/11/2012   Dysuria 02/11/2012   Female stress incontinence 02/11/2012   Irritable bowel syndrome with diarrhea 02/11/2012   Nausea and vomiting 02/11/2012  Nocturia 02/11/2012   Urge incontinence 02/11/2012   Urinary urgency 02/11/2012   Lumbar spondylosis 02/04/2012   Myalgia and myositis 02/04/2012   Depression 02/04/2012   Lumbosacral spondylosis without myelopathy 02/04/2012   OBESITY, UNSPECIFIED 10/03/2010   GENERALIZED ANXIETY DISORDER 10/03/2010   Chest pain 10/03/2010    1. Seasonal allergic rhinitis due to pollen Allergy results were discussed with her at length.  Patient advised on risk and benefits.  She is going to likely resume her shots  2. SOB (shortness of breath) She is at baseline plan is going to be to continue to monitor closely.  There was recent flareups does well with current regimen which will be continued - Spirometry with Graph  3. BMI 40.0-44.9, adult ([place]) Obesity Counseling: Had a lengthy discussion regarding patients BMI and weight issues. Patient was instructed on portion control as well as increased activity. Also discussed caloric restrictions with trying to maintain intake less than 2000 Kcal. Discussions were made in accordance with the 5As of weight management. Simple actions such as not eating late and if able to, taking a walk is suggested.    General Counseling: I have discussed the findings of the evaluation and examination with Levada Dy.  I have also discussed any further diagnostic evaluation thatmay be needed or ordered today. Saprina verbalizes understanding of the findings of todays visit. We also reviewed her medications today and discussed drug interactions and side effects including but not limited excessive drowsiness and altered mental states. We also discussed that there is always a risk not just to her but also people around her. she has been encouraged to  call the office with any questions or concerns that should arise related to todays visit.  Orders Placed This Encounter  Procedures   Spirometry with Graph    Order Specific Question:   Where should this test be performed?    Answer:   [facility]    Order Specific Question:   Basic spirometry    Answer:   Yes    Order Specific Question:   Spirometry pre & post bronchodilator    Answer:   No     Time spent: 23  I have personally obtained a history, examined the patient, evaluated laboratory and imaging results, formulated the assessment and plan and placed orders.    Allyne Gee, MD [facility] Pulmonary and Critical Care Sleep medicine

## 2021-06-27 ENCOUNTER — Other Ambulatory Visit (HOSPITAL_COMMUNITY): Payer: Self-pay

## 2021-06-27 DIAGNOSIS — E782 Mixed hyperlipidemia: Secondary | ICD-10-CM | POA: Diagnosis not present

## 2021-06-27 DIAGNOSIS — I700 Atherosclerosis of aorta: Secondary | ICD-10-CM | POA: Diagnosis not present

## 2021-06-27 DIAGNOSIS — R072 Precordial pain: Secondary | ICD-10-CM | POA: Diagnosis not present

## 2021-06-27 DIAGNOSIS — F419 Anxiety disorder, unspecified: Secondary | ICD-10-CM | POA: Diagnosis not present

## 2021-06-28 DIAGNOSIS — F411 Generalized anxiety disorder: Secondary | ICD-10-CM | POA: Diagnosis not present

## 2021-06-28 DIAGNOSIS — G479 Sleep disorder, unspecified: Secondary | ICD-10-CM | POA: Diagnosis not present

## 2021-06-28 DIAGNOSIS — M792 Neuralgia and neuritis, unspecified: Secondary | ICD-10-CM | POA: Diagnosis not present

## 2021-07-03 DIAGNOSIS — R41841 Cognitive communication deficit: Secondary | ICD-10-CM | POA: Diagnosis not present

## 2021-07-09 ENCOUNTER — Ambulatory Visit (INDEPENDENT_AMBULATORY_CARE_PROVIDER_SITE_OTHER): Payer: Medicare Other

## 2021-07-09 ENCOUNTER — Other Ambulatory Visit: Payer: Self-pay

## 2021-07-09 DIAGNOSIS — J301 Allergic rhinitis due to pollen: Secondary | ICD-10-CM | POA: Diagnosis not present

## 2021-07-23 ENCOUNTER — Ambulatory Visit (INDEPENDENT_AMBULATORY_CARE_PROVIDER_SITE_OTHER): Payer: Medicare Other

## 2021-07-23 ENCOUNTER — Other Ambulatory Visit: Payer: Self-pay

## 2021-07-23 DIAGNOSIS — K219 Gastro-esophageal reflux disease without esophagitis: Secondary | ICD-10-CM | POA: Diagnosis not present

## 2021-07-23 DIAGNOSIS — E119 Type 2 diabetes mellitus without complications: Secondary | ICD-10-CM | POA: Diagnosis not present

## 2021-07-23 DIAGNOSIS — Z6839 Body mass index (BMI) 39.0-39.9, adult: Secondary | ICD-10-CM | POA: Diagnosis not present

## 2021-07-23 DIAGNOSIS — J301 Allergic rhinitis due to pollen: Secondary | ICD-10-CM

## 2021-07-23 DIAGNOSIS — M810 Age-related osteoporosis without current pathological fracture: Secondary | ICD-10-CM | POA: Diagnosis not present

## 2021-07-23 DIAGNOSIS — E559 Vitamin D deficiency, unspecified: Secondary | ICD-10-CM | POA: Diagnosis not present

## 2021-07-23 DIAGNOSIS — Z1231 Encounter for screening mammogram for malignant neoplasm of breast: Secondary | ICD-10-CM | POA: Diagnosis not present

## 2021-07-23 DIAGNOSIS — G43009 Migraine without aura, not intractable, without status migrainosus: Secondary | ICD-10-CM | POA: Diagnosis not present

## 2021-07-23 DIAGNOSIS — I10 Essential (primary) hypertension: Secondary | ICD-10-CM | POA: Diagnosis not present

## 2021-07-23 DIAGNOSIS — E7800 Pure hypercholesterolemia, unspecified: Secondary | ICD-10-CM | POA: Diagnosis not present

## 2021-07-24 ENCOUNTER — Encounter: Payer: Self-pay | Admitting: Registered Nurse

## 2021-07-24 ENCOUNTER — Other Ambulatory Visit (HOSPITAL_COMMUNITY): Payer: Self-pay

## 2021-07-24 ENCOUNTER — Telehealth: Payer: Self-pay | Admitting: Registered Nurse

## 2021-07-24 ENCOUNTER — Encounter: Payer: Medicare Other | Attending: Physical Medicine & Rehabilitation | Admitting: Registered Nurse

## 2021-07-24 ENCOUNTER — Other Ambulatory Visit: Payer: Self-pay

## 2021-07-24 VITALS — BP 133/81 | HR 64 | Ht 63.0 in | Wt 229.0 lb

## 2021-07-24 DIAGNOSIS — Z981 Arthrodesis status: Secondary | ICD-10-CM | POA: Diagnosis not present

## 2021-07-24 DIAGNOSIS — G894 Chronic pain syndrome: Secondary | ICD-10-CM | POA: Diagnosis not present

## 2021-07-24 DIAGNOSIS — Z5181 Encounter for therapeutic drug level monitoring: Secondary | ICD-10-CM | POA: Diagnosis not present

## 2021-07-24 DIAGNOSIS — M542 Cervicalgia: Secondary | ICD-10-CM | POA: Diagnosis not present

## 2021-07-24 DIAGNOSIS — Z79899 Other long term (current) drug therapy: Secondary | ICD-10-CM

## 2021-07-24 DIAGNOSIS — M7062 Trochanteric bursitis, left hip: Secondary | ICD-10-CM | POA: Diagnosis not present

## 2021-07-24 DIAGNOSIS — M797 Fibromyalgia: Secondary | ICD-10-CM

## 2021-07-24 DIAGNOSIS — M5416 Radiculopathy, lumbar region: Secondary | ICD-10-CM

## 2021-07-24 DIAGNOSIS — M47816 Spondylosis without myelopathy or radiculopathy, lumbar region: Secondary | ICD-10-CM | POA: Diagnosis not present

## 2021-07-24 MED ORDER — HYDROCODONE-ACETAMINOPHEN 10-325 MG PO TABS
1.0000 | ORAL_TABLET | Freq: Three times a day (TID) | ORAL | 0 refills | Status: DC | PRN
  Filled 2021-07-24 – 2021-07-26 (×2): qty 90, 30d supply, fill #0

## 2021-07-24 NOTE — Progress Notes (Signed)
Subjective:    Patient ID: Karina Greer, female    DOB: 03-01-1966, 55 y.o.   MRN: [id number]  HPI: Karina Greer is a 55 y.o. female who returns for follow up appointment for chronic pain and medication refill. She states her pain is located in her neck and lower back pain radiating into her left hip. She rates her pain 8. Her current exercise regime is walking and performing stretching exercises.  Karina Greer Morphine equivalent is 30.00 MME. She is also prescribed Clonazepam  by Dr. Melrose Nakayama .We have discussed the black box warning of using opioids and benzodiazepines. I highlighted the dangers of using these drugs together and discussed the adverse events including respiratory suppression, overdose, cognitive impairment and importance of compliance with current regimen. We will continue to monitor and adjust as indicated.    Last Oral Swab was Performed on 04/30/2021, it was consistent.      Pain Inventory Average Pain 9 Pain Right Now 8 My pain is constant, sharp, burning, dull, and stabbing  In the last 24 hours, has pain interfered with the following? General activity 6 Relation with others 6 Enjoyment of life 6 What TIME of day is your pain at its worst? morning , daytime, evening, and night Sleep (in general) Fair  Pain is worse with: walking, bending, sitting, inactivity, standing, and some activites Pain improves with: rest, heat/ice, therapy/exercise, pacing activities, medication, and TENS Relief from Meds: 6  Family History  Problem Relation Age of Onset  . Hypertension Mother   . Diabetes Father   . Brain cancer Father   . Fibromyalgia Sister   . Suicidality Sister   . Hypertension Other        Cancer, Cerebrovascular disease run on mother side of family   Social History   Socioeconomic History  . Marital status: Divorced    Spouse name: Not on file  . Number of children: 3  . Years of education: HS  . Highest education level: 12th grade  Occupational  History  . Occupation: Merchandiser, retail: UNEMPLOYED    Comment: Disability  Tobacco Use  . Smoking status: Never  . Smokeless tobacco: Never  Vaping Use  . Vaping Use: Never used  Substance and Sexual Activity  . Alcohol use: No  . Drug use: No  . Sexual activity: Not on file  Other Topics Concern  . Not on file  Social History Narrative   Patient is right-handed. She avoids caffeine. She has recently been using the treadmill.   Social Determinants of Health   Financial Resource Strain: Not on file  Food Insecurity: Not on file  Transportation Needs: Not on file  Physical Activity: Not on file  Stress: Not on file  Social Connections: Not on file   Past Surgical History:  Procedure Laterality Date  . ACHILLES TENDON SURGERY Right 3/16  . ANTERIOR CERVICAL DECOMP/DISCECTOMY FUSION N/A 03/11/2016   Procedure: C5-6, C6-7 Anterior Cervical Discectomy and Fusion, Allograft, Plate;  Surgeon: Marybelle Killings, MD;  Location: [place];  Service: Orthopedics;  Laterality: N/A;  . CHOLECYSTECTOMY  2014  . COLONOSCOPY WITH PROPOFOL N/A 08/25/2017   Procedure: COLONOSCOPY WITH PROPOFOL;  Surgeon: Manya Silvas, MD;  Location: [facility] ENDOSCOPY;  Service: Endoscopy;  Laterality: N/A;  . CYSTO WITH HYDRODISTENSION N/A 01/26/2014   Procedure: CYSTOSCOPY/HYDRODISTENSION;  Surgeon: Bernestine Amass, MD;  Location: [facility];  Service: Urology;  Laterality: N/A;  . CYSTO/  URETHRAL DILATION/  HYDRODISTENTION/  INSTILLATION THERAPY  07-16-2010//   12-30-2007//   10-27-2006  . ELBOW SURGERY Right   . EXERCISE TOLERENCE TEST  10-12-2010   NEGATIVE  ADEQUATE ETT/  NO ISCHEMIA OR EVIDENCE HIGH GRADE OBSTRUCTIVE CAD/  NO FURTHER TEST NEEDED  . [place] ENDOMETRIAL ABLATION  2014  . LAPAROSCOPIC OVARIAN CYST BX  2005   AND  URETEROSCOPIC LASER LITHO  STONE EXTRACTION  . SHOULDER OPEN ROTATOR CUFF REPAIR Right 2004  . TONSILLECTOMY    . TRANSTHORACIC ECHOCARDIOGRAM   06-07-2006   normal study/  ef 60-65%  . TUBAL LIGATION Bilateral 1995   Past Surgical History:  Procedure Laterality Date  . ACHILLES TENDON SURGERY Right 3/16  . ANTERIOR CERVICAL DECOMP/DISCECTOMY FUSION N/A 03/11/2016   Procedure: C5-6, C6-7 Anterior Cervical Discectomy and Fusion, Allograft, Plate;  Surgeon: Marybelle Killings, MD;  Location: [place];  Service: Orthopedics;  Laterality: N/A;  . CHOLECYSTECTOMY  2014  . COLONOSCOPY WITH PROPOFOL N/A 08/25/2017   Procedure: COLONOSCOPY WITH PROPOFOL;  Surgeon: Manya Silvas, MD;  Location[facility] ENDOSCOPY;  Service: Endoscopy;  Laterality: N/A;  . CYSTO WITH HYDRODISTENSION N/A 01/26/2014   Procedure: CYSTOSCOPY/HYDRODISTENSION;  Surgeon: Bernestine Amass, MD;  Location: [facility];  Service: Urology;  Laterality: N/A;  . CYSTO/  URETHRAL DILATION/  HYDRODISTENTION/   INSTILLATION THERAPY  07-16-2010//   12-30-2007//   10-27-2006  . ELBOW SURGERY Right   . EXERCISE TOLERENCE TEST  10-12-2010   NEGATIVE  ADEQUATE ETT/  NO ISCHEMIA OR EVIDENCE HIGH GRADE OBSTRUCTIVE CAD/  NO FURTHER TEST NEEDED  . [place] ENDOMETRIAL ABLATION  2014  . LAPAROSCOPIC OVARIAN CYST BX  2005   AND  URETEROSCOPIC LASER LITHO  STONE EXTRACTION  . SHOULDER OPEN ROTATOR CUFF REPAIR Right 2004  . TONSILLECTOMY    . TRANSTHORACIC ECHOCARDIOGRAM  06-07-2006   normal study/  ef 60-65%  . TUBAL LIGATION Bilateral 1995   Past Medical History:  Diagnosis Date  . Anxiety   . Anxiety disorder   . Arthritis   . Blepharitis   . Chronic low back pain   . Depression   . Diabetes mellitus without complication ([place])   . Dyslipidemia   . Fibromyalgia   . GERD (gastroesophageal reflux disease)   . Headache    hx migraines  . History of kidney stones   . History of panic attacks   . History of renal calculi   . IBS (irritable bowel syndrome)   . Interstitial cystitis   . Lupus ([place])    tested positive for antibodies for lupus. dr to do  further tests  . Menorrhagia   . OSA on CPAP    not used cpap for several weeks  . Pneumonia    hx  . PONV (postoperative nausea and vomiting)   . RLS (restless legs syndrome)   . Rosacea   . Seasonal asthma   . SI (sacroiliac) joint dysfunction   . SUI (stress urinary incontinence, female)   . UTI (lower urinary tract infection)    hx  . 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 02/23/2013   There were no vitals taken for this visit.  Opioid Risk Score:   Fall Risk Score:  `1  Depression screen PHQ 2/9  Depression screen [facility] 2/9 05/25/2021 04/30/2021 12/04/2020 11/23/2020 09/11/2020 08/30/2020 08/14/2020  Decreased Interest 0 0 0 0 0 0 0  Down, Depressed, Hopeless 0 0 0 0 0 0 0  PHQ -  2 Score 0 0 0 0 0 0 0  Altered sleeping - - - - - - -  Tired, decreased energy - - - - - - -  Change in appetite - - - - - - -  Feeling bad or failure about yourself  - - - - - - -  Trouble concentrating - - - - - - -  Moving slowly or fidgety/restless - - - - - - -  Suicidal thoughts - - - - - - -  PHQ-9 Score - - - - - - -  Some recent data might be hidden    Review of Systems  Musculoskeletal:  Positive for arthralgias and back pain.  Psychiatric/Behavioral:  Negative for agitation.   All other systems reviewed and are negative.     Objective:   Physical Exam Vitals and nursing note reviewed.  Constitutional:      Appearance: Normal appearance. She is obese.  Neck:     Comments: Cervical Paraspinal Tenderness: C-5-C-6 Cardiovascular:     Rate and Rhythm: Normal rate and regular rhythm.     Pulses: Normal pulses.     Heart sounds: Normal heart sounds.  Pulmonary:     Effort: Pulmonary effort is normal.     Breath sounds: Normal breath sounds.  Musculoskeletal:     Cervical back: Normal range of motion and neck supple.     Comments: Normal Muscle Bulk and Muscle Testing Reveals:  Upper Extremities:Full ROM and Muscle Strength 5/5  Lumbar Paraspinal Tenderness: L-3-L-55 Lower  Extremities: Full ROM and Muscle Strength 5/5  Arises from Chair with ease Narrow Based Gait     Skin:    General: Skin is warm and dry.  Neurological:     Mental Status: She is alert and oriented to person, place, and time.  Psychiatric:        Mood and Affect: Mood normal.        Behavior: Behavior normal.         Assessment & Plan:  1. Lumbago/ Lumbar Spondylosis/ Left Lumbar Radiculitis: Continue to Monitor. 11/01/202. Refilled: HYDROcodone 10/325mg  one tablet every 8 hours as needed #90.  We will continue the opioid monitoring program, this consists of regular clinic visits, examinations, urine drug screen, pill counts as well as use of [place] Controlled Substance Reporting system. A 12 month History has been reviewed on the [place] on 07/24/2021. 2. Fibromyalgia. Continue Current exercise Regime. 07/24/2021 3. Anxiety and depression:Psychiatry Following: Counselor Jessica  at the [place] . Continue Counseling at [facility]. 07/24/2021 4. Migraines: On Maxalt. Neurology Following. 07/24/2021 5. OSA : PCP Following. Continue to Monitor. 07/24/2021 6. Obesity: She has a scheduled appointmne twith Dr Ranell Patrick this month. Following Healthy Diet Regimen and Continue HEP as Tolerated. 07/24/2021 7. Status Post Cervical Spinal Fusion: C5C6- C-6- C-7 Anterior Cervical Discectomy Fusion and Allograft Plate: Dr. Lorin Mercy Following. 07/24/2021 8. Cervicalgia/ Cervical Radiculitis/ S/P Cervical Spinal Fusion:  Continue to Monitor Dr. Lorin Mercy Following. 07/24/2021 9. Muscle Spasm: Continue Tizanidine as needed.07/24/2021 10.Hereditary and Idiopathic Peripheral Neuropathy Continue with Tens Unit. 07/24/2021. 11.Right   Greater Trochanteric Bursitis: . Continue to Alternate Ice and heat Therapy. Continue to Monitor. 07/24/2021. 12. Chronic Bilateral Knee Pain: No Complaints Today. Continue HEP as Tolerated. Continue to Monitor.  07/24/2021 13. Lateral Epicondylitis of Right Elbow: No complaints today.  Ortho Following. S/P   06/26/2020 Right lateral epicondyle debridement, drilling and repair  By Dr Lorin Mercy. 14. Neuralgia/  Right Facial Droop: Neurology following. Continue to monitor. 07/24/2021. 15. Polyarthralgia: No complaints today. Continue HEP as tolerated. Continue to monitor. 07/24/2021   F/U in 1 month .

## 2021-07-24 NOTE — Telephone Encounter (Signed)
Dr Ranell Patrick,  I am seeing Karina Greer today, she stated you sent in a continuous glucose meter, the pharmacist told her the one you ordered is becoming obsolete.  The pharmacist told Ms Broers you need to order Libre 2, make sure the diagnosis is on the prescription.

## 2021-07-25 ENCOUNTER — Other Ambulatory Visit (HOSPITAL_COMMUNITY): Payer: Self-pay

## 2021-07-25 ENCOUNTER — Ambulatory Visit: Payer: Medicare Other | Admitting: Registered Nurse

## 2021-07-26 ENCOUNTER — Other Ambulatory Visit (HOSPITAL_COMMUNITY): Payer: Self-pay

## 2021-08-02 ENCOUNTER — Ambulatory Visit: Payer: Medicare Other | Admitting: Surgery

## 2021-08-06 ENCOUNTER — Ambulatory Visit: Payer: Medicare Other

## 2021-08-07 ENCOUNTER — Ambulatory Visit: Payer: Medicare Other

## 2021-08-08 DIAGNOSIS — Z6839 Body mass index (BMI) 39.0-39.9, adult: Secondary | ICD-10-CM | POA: Diagnosis not present

## 2021-08-08 DIAGNOSIS — J019 Acute sinusitis, unspecified: Secondary | ICD-10-CM | POA: Diagnosis not present

## 2021-08-09 ENCOUNTER — Other Ambulatory Visit: Payer: Self-pay

## 2021-08-09 ENCOUNTER — Ambulatory Visit (INDEPENDENT_AMBULATORY_CARE_PROVIDER_SITE_OTHER): Payer: Medicare Other

## 2021-08-09 DIAGNOSIS — J301 Allergic rhinitis due to pollen: Secondary | ICD-10-CM

## 2021-08-09 DIAGNOSIS — M816 Localized osteoporosis [Lequesne]: Secondary | ICD-10-CM | POA: Diagnosis not present

## 2021-08-09 DIAGNOSIS — M810 Age-related osteoporosis without current pathological fracture: Secondary | ICD-10-CM | POA: Diagnosis not present

## 2021-08-11 DIAGNOSIS — E785 Hyperlipidemia, unspecified: Secondary | ICD-10-CM | POA: Diagnosis not present

## 2021-08-11 DIAGNOSIS — R202 Paresthesia of skin: Secondary | ICD-10-CM | POA: Diagnosis not present

## 2021-08-11 DIAGNOSIS — M797 Fibromyalgia: Secondary | ICD-10-CM | POA: Diagnosis not present

## 2021-08-11 DIAGNOSIS — Z79899 Other long term (current) drug therapy: Secondary | ICD-10-CM | POA: Diagnosis not present

## 2021-08-11 DIAGNOSIS — R42 Dizziness and giddiness: Secondary | ICD-10-CM | POA: Diagnosis not present

## 2021-08-11 DIAGNOSIS — Z885 Allergy status to narcotic agent status: Secondary | ICD-10-CM | POA: Diagnosis not present

## 2021-08-11 DIAGNOSIS — J0110 Acute frontal sinusitis, unspecified: Secondary | ICD-10-CM | POA: Diagnosis not present

## 2021-08-23 ENCOUNTER — Other Ambulatory Visit: Payer: Self-pay

## 2021-08-23 ENCOUNTER — Encounter: Payer: Medicare Other | Attending: Physical Medicine & Rehabilitation | Admitting: Physical Medicine and Rehabilitation

## 2021-08-23 ENCOUNTER — Encounter: Payer: Self-pay | Admitting: Physical Medicine and Rehabilitation

## 2021-08-23 ENCOUNTER — Other Ambulatory Visit (HOSPITAL_COMMUNITY): Payer: Self-pay

## 2021-08-23 VITALS — BP 132/84 | HR 67 | Temp 98.0°F | Ht 63.0 in | Wt 224.8 lb

## 2021-08-23 DIAGNOSIS — M62838 Other muscle spasm: Secondary | ICD-10-CM | POA: Insufficient documentation

## 2021-08-23 DIAGNOSIS — M4807 Spinal stenosis, lumbosacral region: Secondary | ICD-10-CM | POA: Diagnosis not present

## 2021-08-23 DIAGNOSIS — M5412 Radiculopathy, cervical region: Secondary | ICD-10-CM | POA: Diagnosis not present

## 2021-08-23 DIAGNOSIS — G894 Chronic pain syndrome: Secondary | ICD-10-CM | POA: Diagnosis not present

## 2021-08-23 DIAGNOSIS — M48062 Spinal stenosis, lumbar region with neurogenic claudication: Secondary | ICD-10-CM | POA: Insufficient documentation

## 2021-08-23 DIAGNOSIS — G8929 Other chronic pain: Secondary | ICD-10-CM | POA: Insufficient documentation

## 2021-08-23 DIAGNOSIS — M7062 Trochanteric bursitis, left hip: Secondary | ICD-10-CM | POA: Insufficient documentation

## 2021-08-23 DIAGNOSIS — M546 Pain in thoracic spine: Secondary | ICD-10-CM | POA: Insufficient documentation

## 2021-08-23 DIAGNOSIS — Z981 Arthrodesis status: Secondary | ICD-10-CM | POA: Insufficient documentation

## 2021-08-23 DIAGNOSIS — M797 Fibromyalgia: Secondary | ICD-10-CM | POA: Diagnosis not present

## 2021-08-23 DIAGNOSIS — M545 Low back pain, unspecified: Secondary | ICD-10-CM | POA: Insufficient documentation

## 2021-08-23 DIAGNOSIS — G43909 Migraine, unspecified, not intractable, without status migrainosus: Secondary | ICD-10-CM | POA: Diagnosis not present

## 2021-08-23 DIAGNOSIS — Z5181 Encounter for therapeutic drug level monitoring: Secondary | ICD-10-CM | POA: Insufficient documentation

## 2021-08-23 DIAGNOSIS — M79605 Pain in left leg: Secondary | ICD-10-CM | POA: Diagnosis not present

## 2021-08-23 DIAGNOSIS — Z79899 Other long term (current) drug therapy: Secondary | ICD-10-CM | POA: Diagnosis not present

## 2021-08-23 DIAGNOSIS — M542 Cervicalgia: Secondary | ICD-10-CM | POA: Diagnosis not present

## 2021-08-23 MED ORDER — HYDROCODONE-ACETAMINOPHEN 10-325 MG PO TABS
1.0000 | ORAL_TABLET | Freq: Three times a day (TID) | ORAL | 0 refills | Status: DC | PRN
  Filled 2021-08-23: qty 90, 30d supply, fill #0

## 2021-08-23 NOTE — Progress Notes (Signed)
Left Trochanteric bursa injection  Indication Trochanteric bursitis. Exam has tenderness over the greater trochanter of the hip. Pain has not responded to conservative care such as exercise therapy and oral medications. Pain interferes with sleep or with mobility Informed consent was obtained after describing risks and benefits of the procedure with the patient these include bleeding bruising and infection. Patient has signed written consent form. Patient placed in a lateral decubitus position with the affected hip superior. Point of maximal pain was palpated marked and prepped with Betadine and entered with a needle to bone contact. Needle slightly withdrawn then 47mL celestone with 3 cc 1% lidocaine were injected. Patient tolerated procedure well. Post procedure instructions given.

## 2021-08-23 NOTE — Patient Instructions (Signed)
Bioptemizers- complete form of magnesium

## 2021-08-23 NOTE — Addendum Note (Signed)
Addended by: Izora Ribas on: 08/23/2021 09:50 AM   Modules accepted: Orders

## 2021-08-23 NOTE — Addendum Note (Signed)
Addended by: Izora Ribas on: 08/23/2021 04:08 PM   Modules accepted: Orders

## 2021-08-24 ENCOUNTER — Other Ambulatory Visit: Payer: Self-pay | Admitting: Registered Nurse

## 2021-08-24 ENCOUNTER — Other Ambulatory Visit (HOSPITAL_COMMUNITY): Payer: Self-pay

## 2021-08-24 DIAGNOSIS — E119 Type 2 diabetes mellitus without complications: Secondary | ICD-10-CM | POA: Diagnosis not present

## 2021-08-24 MED ORDER — TIZANIDINE HCL 2 MG PO TABS
2.0000 mg | ORAL_TABLET | Freq: Three times a day (TID) | ORAL | 2 refills | Status: DC | PRN
  Filled 2021-08-24: qty 50, 17d supply, fill #0
  Filled 2021-09-20: qty 50, 17d supply, fill #1
  Filled 2021-10-19: qty 50, 17d supply, fill #2

## 2021-08-30 ENCOUNTER — Ambulatory Visit: Payer: Medicare Other

## 2021-09-04 ENCOUNTER — Encounter (HOSPITAL_BASED_OUTPATIENT_CLINIC_OR_DEPARTMENT_OTHER): Payer: Medicare Other | Admitting: Physical Medicine and Rehabilitation

## 2021-09-04 ENCOUNTER — Ambulatory Visit
Admission: RE | Admit: 2021-09-04 | Discharge: 2021-09-04 | Disposition: A | Discharge status: Home / Self Care | Payer: Medicare Other | Source: Ambulatory Visit | Attending: Physical Medicine and Rehabilitation | Admitting: Physical Medicine and Rehabilitation

## 2021-09-04 ENCOUNTER — Other Ambulatory Visit: Payer: Self-pay

## 2021-09-04 ENCOUNTER — Other Ambulatory Visit (HOSPITAL_COMMUNITY): Payer: Self-pay

## 2021-09-04 DIAGNOSIS — G43909 Migraine, unspecified, not intractable, without status migrainosus: Secondary | ICD-10-CM

## 2021-09-04 DIAGNOSIS — M79605 Pain in left leg: Secondary | ICD-10-CM

## 2021-09-04 DIAGNOSIS — G8929 Other chronic pain: Secondary | ICD-10-CM | POA: Diagnosis not present

## 2021-09-04 DIAGNOSIS — M47816 Spondylosis without myelopathy or radiculopathy, lumbar region: Secondary | ICD-10-CM | POA: Diagnosis not present

## 2021-09-04 DIAGNOSIS — M48061 Spinal stenosis, lumbar region without neurogenic claudication: Secondary | ICD-10-CM | POA: Diagnosis not present

## 2021-09-04 DIAGNOSIS — M48062 Spinal stenosis, lumbar region with neurogenic claudication: Secondary | ICD-10-CM

## 2021-09-04 DIAGNOSIS — M4807 Spinal stenosis, lumbosacral region: Secondary | ICD-10-CM

## 2021-09-04 DIAGNOSIS — M545 Low back pain, unspecified: Secondary | ICD-10-CM

## 2021-09-04 MED ORDER — LIDOCAINE 5 % EX PTCH
1.0000 | MEDICATED_PATCH | CUTANEOUS | 0 refills | Status: DC
  Filled 2021-09-04 – 2021-09-20 (×2): qty 30, 30d supply, fill #0

## 2021-09-04 NOTE — Progress Notes (Signed)
Subjective:    Patient ID: Karina Greer, female    DOB: [birth year], 55 y.o.   MRN: [id number]  HPI:  An audio/video tele-health visit is felt to be the most appropriate encounter for this patient at this time. This is a follow up tele-visit via phone. The patient is at home. MD is at office.     Karina Greer is a 55 y.o. female with fibromyalgia who presents for nutritional counseling regarding her pain and medical conditions.   -She has trouble eating in the morning. Sometimes she does not eat until lunch. She has been trying to do the fasting we discussed last visit.  -Her son is 278 pounds and 6 foot tall. He went to 242 lbs following a similar diet.  -Her dietary goals are to make use of what she eats.  -she has been eating a lot of salads.  -she cannot handle hard cheeses.  -she has been having a problem eating hamburger.  -Last night she made four pieces of chicken and put on some tomato sauce and red peppers, she sprinkled some cheese, added broccoli, olive oil, garlic, butter.  -she is planned to have her HgbA1c and lipid panel drawn by her PCP next week.  -has been having low back pain, MRI ordered and shows stable spondylosis from prior -she feels better when there is no food in her stomach -she got organic oranges from a farm and is going to make her own orange juice -she bought the Bioptemizer's magnesium so will start this soon -she is having a lot of pain in her neck and feels this is from the weather and her surgery -her calf is hurting a lot. She has not had an ultrasound. -blood clots run in her family.      Pain Inventory Average Pain 8 Pain Right Now 6 My pain is constant, stabbing, tingling, and aching  In the last 24 hours, has pain interfered with the following? General activity 8 Relation with others 8 Enjoyment of life 8 What TIME of day is your pain at its worst? morning , daytime, evening, and night Sleep (in general) Fair  Pain is worse with:  walking, bending, sitting, inactivity, standing, unsure, and some activites Pain improves with: rest, heat/ice, therapy/exercise, pacing activities, medication, TENS, and injections Relief from Meds: 6  Family History  Problem Relation Age of Onset   Hypertension Mother    Diabetes Father    Brain cancer Father    Fibromyalgia Sister    Suicidality Sister    Hypertension Other        Cancer, Cerebrovascular disease run on mother side of family   Social History   Socioeconomic History   Marital status: Divorced    Spouse name: Not on file   Number of children: 3   Years of education: HS   Highest education level: 12th grade  Occupational History   Occupation: Merchandiser, retail: UNEMPLOYED    Comment: Disability  Tobacco Use   Smoking status: Never   Smokeless tobacco: Never  Vaping Use   Vaping Use: Never used  Substance and Sexual Activity   Alcohol use: No   Drug use: No   Sexual activity: Not on file  Other Topics Concern   Not on file  Social History Narrative   Patient is right-handed. She avoids caffeine. She has recently been using the treadmill.   Social Determinants of Health   Financial Resource Strain: Not on file  Food Insecurity:  Not on file  Transportation Needs: Not on file  Physical Activity: Not on file  Stress: Not on file  Social Connections: Not on file   Past Surgical History:  Procedure Laterality Date   ACHILLES TENDON SURGERY Right 3/16   ANTERIOR CERVICAL DECOMP/DISCECTOMY FUSION N/A 03/11/2016   Procedure: C5-6, C6-7 Anterior Cervical Discectomy and Fusion, Allograft, Plate;  Surgeon: Marybelle Killings, MD;  Location: [place];  Service: Orthopedics;  Laterality: N/A;   CHOLECYSTECTOMY  2014   COLONOSCOPY WITH PROPOFOL N/A 08/25/2017   Procedure: COLONOSCOPY WITH PROPOFOL;  Surgeon: Manya Silvas, MD;  Location[facility] ENDOSCOPY;  Service: Endoscopy;  Laterality: N/A;   CYSTO WITH HYDRODISTENSION N/A 01/26/2014   Procedure:  CYSTOSCOPY/HYDRODISTENSION;  Surgeon: Bernestine Amass, MD;  Location: [facility];  Service: Urology;  Laterality: N/A;   CYSTO/  URETHRAL DILATION/  HYDRODISTENTION/   INSTILLATION THERAPY  07-16-2010//   12-30-2007//   10-27-2006   ELBOW SURGERY Right    EXERCISE TOLERENCE TEST  10-12-2010   NEGATIVE  ADEQUATE ETT/  NO ISCHEMIA OR EVIDENCE HIGH GRADE OBSTRUCTIVE CAD/  NO FURTHER TEST NEEDED   HYSTEROSCOPY W/  [place] ENDOMETRIAL ABLATION  2014   LAPAROSCOPIC OVARIAN CYST BX  2005   AND  URETEROSCOPIC LASER LITHO  STONE EXTRACTION   SHOULDER OPEN ROTATOR CUFF REPAIR Right 2004   TONSILLECTOMY     TRANSTHORACIC ECHOCARDIOGRAM  06-07-2006   normal study/  ef 60-65%   TUBAL LIGATION Bilateral 1995   Past Surgical History:  Procedure Laterality Date   ACHILLES TENDON SURGERY Right 3/16   ANTERIOR CERVICAL DECOMP/DISCECTOMY FUSION N/A 03/11/2016   Procedure: C5-6, C6-7 Anterior Cervical Discectomy and Fusion, Allograft, Plate;  Surgeon: Marybelle Killings, MD;  Location: [place];  Service: Orthopedics;  Laterality: N/A;   CHOLECYSTECTOMY  2014   COLONOSCOPY WITH PROPOFOL N/A 08/25/2017   Procedure: COLONOSCOPY WITH PROPOFOL;  Surgeon: Manya Silvas, MD;  [facility]COPY;  Service: Endoscopy;  Laterality: N/A;   CYSTO WITH HYDRODISTENSION N/A 01/26/2014   Procedure: CYSTOSCOPY/HYDRODISTENSION;  Surgeon: Bernestine Amass, MD;  Location: [facility];  Service: Urology;  Laterality: N/A;   CYSTO/  URETHRAL DILATION/  HYDRODISTENTION/   INSTILLATION THERAPY  07-16-2010//   12-30-2007//   10-27-2006   ELBOW SURGERY Right    EXERCISE TOLERENCE TEST  10-12-2010   NEGATIVE  ADEQUATE ETT/  NO ISCHEMIA OR EVIDENCE HIGH GRADE OBSTRUCTIVE CAD/  NO FURTHER TEST NEEDED   HYSTEROSCOPY W/  [place] ENDOMETRIAL ABLATION  2014   LAPAROSCOPIC OVARIAN CYST BX  2005   AND  URETEROSCOPIC LASER LITHO  STONE EXTRACTION   SHOULDER OPEN ROTATOR CUFF REPAIR Right 2004   TONSILLECTOMY      TRANSTHORACIC ECHOCARDIOGRAM  06-07-2006   normal study/  ef 60-65%   TUBAL LIGATION Bilateral 1995   Past Medical History:  Diagnosis Date   Anxiety    Anxiety disorder    Arthritis    Blepharitis    Chronic low back pain    Depression    Diabetes mellitus without complication ([facility])    Dyslipidemia    Fibromyalgia    GERD (gastroesophageal reflux disease)    Headache    hx migraines   History of kidney stones    History of panic attacks    History of renal calculi    IBS (irritable bowel syndrome)    Interstitial cystitis    Lupus ([place])    tested positive for antibodies for lupus. dr  to do further tests   Menorrhagia    OSA on CPAP    not used cpap for several weeks   Pneumonia    hx   PONV (postoperative nausea and vomiting)    RLS (restless legs syndrome)    Rosacea    Seasonal asthma    SI (sacroiliac) joint dysfunction    SUI (stress urinary incontinence, female)    UTI (lower urinary tract infection)    hx   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 02/23/2013   There were no vitals taken for this visit.  Opioid Risk Score:   Fall Risk Score:  `1  Depression screen PHQ 2/9  Depression screen [facility] 2/9 08/23/2021 07/24/2021 05/25/2021 04/30/2021 12/04/2020 11/23/2020 09/11/2020  Decreased Interest 0 0 0 0 0 0 0  Down, Depressed, Hopeless 0 0 0 0 0 0 0  PHQ - 2 Score 0 0 0 0 0 0 0  Altered sleeping - - - - - - -  Tired, decreased energy - - - - - - -  Change in appetite - - - - - - -  Feeling bad or failure about yourself  - - - - - - -  Trouble concentrating - - - - - - -  Moving slowly or fidgety/restless - - - - - - -  Suicidal thoughts - - - - - - -  PHQ-9 Score - - - - - - -  Some recent data might be hidden    Review of Systems  Musculoskeletal:  Positive for arthralgias and back pain.       Pain all over the body  All other systems reviewed and are negative.     Objective:   Physical Exam Not performed since patient was seen via phone.      Assessment & Plan:  1. Lumbago/ Lumbar Spondylosis/ Left Lumbar Radiculitis: Continue to Monitor. 09/02/202. Refilled HYDROcodone 10/325mg  one tablet every 8 hours as needed #90. Discussed that MRI results show spondylosis that is stable from prior. Continue using muscle rub. Refilled Lidocaine patches.  2. Fibromyalgia. Continue Current exercise Regime. 05/25/2021. Discussed qua therapy.  3. Anxiety and depression:Psychiatry Following: Counselor Jessica  at the [place] . Continue Counseling at [facility]. 05/25/2021 4. Migraines: On Maxalt. Neurology Following. 05/25/2021. Start Bioptemizers magnesium.  5. OSA : PCP Following. Continue to Monitor. 05/25/2021 6. Obesity:  -continue to avoid bread -continue 2 TB of saurkraut every morning  -recommended nattokinase -counseled regarding the benefits of intermittent fasting and improving insulin sensitivity. -Educated that current weight is 225 lbs and current BMI is 40.00 -Educated regarding health benefits of weight loss- for pain, general health, chronic disease prevention, immune health, mental health.  -Will monitor weight every visit.  -Continue [place] tea daily. -Discussed the benefits of intermittent fasting. Continue this. Discussed that 30 hour fasts are healthy.  -Discussed foods that can assist in weight loss: 1) leafy greens- high in fiber and nutrients 2) dark chocolate- improves metabolism (if prefer sweetened, best to sweeten with honey instead of sugar).  3) cruciferous vegetables- high in fiber and protein 4) full fat yogurt: high in healthy fat, protein, calcium, and probiotics 5) apples- high in a variety of phytochemicals 6) nuts- high in fiber and protein that increase feelings of fullness 7) grapefruit: rich in nutrients, antioxidants, and fiber (not to be taken with anticoagulation) 8) beans- high in protein and fiber 9) salmon- has high quality protein and healthy fats 10) green tea- rich  in  polyphenols 11) eggs- rich in choline and vitamin D 12) tuna- high protein, boosts metabolism 13) avocado- decreases visceral abdominal fat 14) chicken (pasture raised): high in protein and iron 15) blueberries- reduce abdominal fat and cholesterol 16) whole grains- decreases calories retained during digestion, speeds metabolism 17) chia seeds- curb appetite 18) chilies- increases fat metabolism  -Discussed supplements that can be used:  1) Metatrim 400mg  BID 30 minutes before breakfast and dinner  2) Sphaeranthus indicus and Garcinia mangostana (combinations of these and #1 can be found in capsicum and zychrome  3) green coffee bean extract 400mg  twice per day or Irvingia (african mango) 150 to 300mg  twice per day. Teas: Green tea Black tea Oolong tea White tea [place] [employer] [place], [place] Continue to avoid added sugar. Continue using honey or monk fruit as sweeteners.   7. Status Post Cervical Spinal Fusion: C5C6- C-6- C-7 Anterior Cervical Discectomy Fusion and Allograft Plate: Dr. Lorin Mercy Following. 05/25/2021 8. Cervicalgia/ Cervical Radiculitis/ S/P Cervical Spinal Fusion:  Continue to Monitor Dr. Lorin Mercy Following. 05/25/2021 9. 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 05/25/2021. 11.Right   Greater Trochanteric Bursitis: . Continue to Alternate Ice and heat Therapy. Continue to Monitor. 05/25/2021. 12. Chronic Bilateral Knee Pain: No Complaints Today. Continue HEP as Tolerated. Continue to Monitor. 05/25/2021 13. Lateral Epicondylitis of Right Elbow: No complaints today.  Ortho Following. S/P   06/26/2020 Right lateral epicondyle debridement, drilling and repair  By Dr Lorin Mercy. 14. Neuralgia/ Right Facial Droop: Neurology following. Continue to monitor. 05/25/2021. 15. Polyarthralgia: No complaints today. Continue HEP as tolerated. Continue to monitor.  16. Nausea: discussed trying an elimination diet  avoiding gluten, dairy, and eggs for 4 weeks.  17. Right lower extremity pain: vascular ultrasound ordered to assess for clot.   12 min spent in discussion of her low back pain, her MRI results, right lower extremity pain, diet, intermittent fasting, current foods she is eating.

## 2021-09-06 ENCOUNTER — Ambulatory Visit (INDEPENDENT_AMBULATORY_CARE_PROVIDER_SITE_OTHER): Payer: Medicare Other

## 2021-09-06 ENCOUNTER — Other Ambulatory Visit: Payer: Self-pay

## 2021-09-06 DIAGNOSIS — J301 Allergic rhinitis due to pollen: Secondary | ICD-10-CM

## 2021-09-07 ENCOUNTER — Encounter: Payer: Medicare Other | Admitting: Physical Medicine and Rehabilitation

## 2021-09-12 ENCOUNTER — Other Ambulatory Visit (HOSPITAL_COMMUNITY): Payer: Self-pay

## 2021-09-20 ENCOUNTER — Other Ambulatory Visit (HOSPITAL_COMMUNITY): Payer: Self-pay

## 2021-09-20 ENCOUNTER — Encounter (HOSPITAL_BASED_OUTPATIENT_CLINIC_OR_DEPARTMENT_OTHER): Payer: Medicare Other | Admitting: Registered Nurse

## 2021-09-20 ENCOUNTER — Ambulatory Visit (INDEPENDENT_AMBULATORY_CARE_PROVIDER_SITE_OTHER): Payer: Medicare Other

## 2021-09-20 ENCOUNTER — Encounter: Payer: Self-pay | Admitting: Registered Nurse

## 2021-09-20 ENCOUNTER — Other Ambulatory Visit: Payer: Self-pay

## 2021-09-20 VITALS — BP 104/65 | HR 68 | Ht 63.0 in | Wt 231.0 lb

## 2021-09-20 DIAGNOSIS — J301 Allergic rhinitis due to pollen: Secondary | ICD-10-CM | POA: Diagnosis not present

## 2021-09-20 DIAGNOSIS — Z981 Arthrodesis status: Secondary | ICD-10-CM

## 2021-09-20 DIAGNOSIS — M546 Pain in thoracic spine: Secondary | ICD-10-CM

## 2021-09-20 DIAGNOSIS — M545 Low back pain, unspecified: Secondary | ICD-10-CM

## 2021-09-20 DIAGNOSIS — M7062 Trochanteric bursitis, left hip: Secondary | ICD-10-CM

## 2021-09-20 DIAGNOSIS — G8929 Other chronic pain: Secondary | ICD-10-CM | POA: Diagnosis not present

## 2021-09-20 DIAGNOSIS — Z5181 Encounter for therapeutic drug level monitoring: Secondary | ICD-10-CM

## 2021-09-20 DIAGNOSIS — M5412 Radiculopathy, cervical region: Secondary | ICD-10-CM | POA: Diagnosis not present

## 2021-09-20 DIAGNOSIS — M797 Fibromyalgia: Secondary | ICD-10-CM

## 2021-09-20 DIAGNOSIS — M48062 Spinal stenosis, lumbar region with neurogenic claudication: Secondary | ICD-10-CM

## 2021-09-20 DIAGNOSIS — M4807 Spinal stenosis, lumbosacral region: Secondary | ICD-10-CM | POA: Diagnosis not present

## 2021-09-20 DIAGNOSIS — M542 Cervicalgia: Secondary | ICD-10-CM

## 2021-09-20 DIAGNOSIS — M62838 Other muscle spasm: Secondary | ICD-10-CM | POA: Diagnosis not present

## 2021-09-20 DIAGNOSIS — Z79899 Other long term (current) drug therapy: Secondary | ICD-10-CM | POA: Diagnosis not present

## 2021-09-20 DIAGNOSIS — G894 Chronic pain syndrome: Secondary | ICD-10-CM

## 2021-09-20 MED ORDER — HYDROCODONE-ACETAMINOPHEN 10-325 MG PO TABS
1.0000 | ORAL_TABLET | Freq: Three times a day (TID) | ORAL | 0 refills | Status: DC | PRN
Start: 2021-09-20 — End: 2021-10-19
  Filled 2021-09-20: qty 90, 30d supply, fill #0

## 2021-09-20 NOTE — Progress Notes (Signed)
Subjective:    Patient ID: Karina Greer, female    DOB: [birth year], 55 y.o.   MRN: [id number]  HPI: Karina Greer is a 55 y.o. female who returns for follow up appointment for chronic pain and medication refill. She states her pain is located in her neck radiating into her bilateral shoulders, mid- lower back. She also reports left hip pain. She rates her pain 6. Her current exercise regime is walking and performing stretching exercises.   Karina Greer Morphine equivalent is 30.00 MME.   Last Oral Swab was performed on 04/30/2021, it was consistent.      Pain Inventory Average Pain 8 Pain Right Now 6 My pain is constant and aching  In the last 24 hours, has pain interfered with the following? General activity 6 Relation with others 7 Enjoyment of life 5 What TIME of day is your pain at its worst? morning , daytime, evening, and night Sleep (in general) Fair  Pain is worse with: walking, bending, sitting, standing, and some activites Pain improves with: rest, heat/ice, therapy/exercise, pacing activities, medication, TENS, and injections Relief from Meds: 6  Family History  Problem Relation Age of Onset   Hypertension Mother    Diabetes Father    Brain cancer Father    Fibromyalgia Sister    Suicidality Sister    Hypertension Other        Cancer, Cerebrovascular disease run on mother side of family   Social History   Socioeconomic History   Marital status: Divorced    Spouse name: Not on file   Number of children: 3   Years of education: HS   Highest education level: 12th grade  Occupational History   Occupation: Merchandiser, retail: UNEMPLOYED    Comment: Disability  Tobacco Use   Smoking status: Never   Smokeless tobacco: Never  Vaping Use   Vaping Use: Never used  Substance and Sexual Activity   Alcohol use: No   Drug use: No   Sexual activity: Not on file  Other Topics Concern   Not on file  Social History Narrative   Patient is right-handed. She  avoids caffeine. She has recently been using the treadmill.   Social Determinants of Health   Financial Resource Strain: Not on file  Food Insecurity: Not on file  Transportation Needs: Not on file  Physical Activity: Not on file  Stress: Not on file  Social Connections: Not on file   Past Surgical History:  Procedure Laterality Date   ACHILLES TENDON SURGERY Right 3/16   ANTERIOR CERVICAL DECOMP/DISCECTOMY FUSION N/A 03/11/2016   Procedure: C5-6, C6-7 Anterior Cervical Discectomy and Fusion, Allograft, Plate;  Surgeon: Marybelle Killings, MD;  Location: [place];  Service: Orthopedics;  Laterality: N/A;   CHOLECYSTECTOMY  2014   COLONOSCOPY WITH PROPOFOL N/A 08/25/2017   Procedure: COLONOSCOPY WITH PROPOFOL;  Surgeon: Manya Silvas, MD;  Location: [facility] ENDOSCOPY;  Service: Endoscopy;  Laterality: N/A;   CYSTO WITH HYDRODISTENSION N/A 01/26/2014   Procedure: CYSTOSCOPY/HYDRODISTENSION;  Surgeon: Bernestine Amass, MD;  Location: [facility];  Service: Urology;  Laterality: N/A;   CYSTO/  URETHRAL DILATION/  HYDRODISTENTION/   INSTILLATION THERAPY  07-16-2010//   12-30-2007//   10-27-2006   ELBOW SURGERY Right    EXERCISE TOLERENCE TEST  10-12-2010   NEGATIVE  ADEQUATE ETT/  NO ISCHEMIA OR EVIDENCE HIGH GRADE OBSTRUCTIVE CAD/  NO FURTHER TEST NEEDED   HYSTEROSCOPY W/  [place] ENDOMETRIAL ABLATION  2014  LAPAROSCOPIC OVARIAN CYST BX  2005   AND  URETEROSCOPIC LASER LITHO  STONE EXTRACTION   SHOULDER OPEN ROTATOR CUFF REPAIR Right 2004   TONSILLECTOMY     TRANSTHORACIC ECHOCARDIOGRAM  06-07-2006   normal study/  ef 60-65%   TUBAL LIGATION Bilateral 1995   Past Surgical History:  Procedure Laterality Date   ACHILLES TENDON SURGERY Right 3/16   ANTERIOR CERVICAL DECOMP/DISCECTOMY FUSION N/A 03/11/2016   Procedure: C5-6, C6-7 Anterior Cervical Discectomy and Fusion, Allograft, Plate;  Surgeon: Marybelle Killings, MD;  Location: [place];  Service: Orthopedics;  Laterality: N/A;    CHOLECYSTECTOMY  2014   COLONOSCOPY WITH PROPOFOL N/A 08/25/2017   Procedure: COLONOSCOPY WITH PROPOFOL;  Surgeon: Manya Silvas, MD;  Location: [facility] ENDOSCOPY;  Service: Endoscopy;  Laterality: N/A;   CYSTO WITH HYDRODISTENSION N/A 01/26/2014   Procedure: CYSTOSCOPY/HYDRODISTENSION;  Surgeon: Bernestine Amass, MD;  Location: [facility];  Service: Urology;  Laterality: N/A;   CYSTO/  URETHRAL DILATION/  HYDRODISTENTION/   INSTILLATION THERAPY  07-16-2010//   12-30-2007//   10-27-2006   ELBOW SURGERY Right    EXERCISE TOLERENCE TEST  10-12-2010   NEGATIVE  ADEQUATE ETT/  NO ISCHEMIA OR EVIDENCE HIGH GRADE OBSTRUCTIVE CAD/  NO FURTHER TEST NEEDED   HYSTEROSCOPY W/  [place] ENDOMETRIAL ABLATION  2014   LAPAROSCOPIC OVARIAN CYST BX  2005   AND  URETEROSCOPIC LASER LITHO  STONE EXTRACTION   SHOULDER OPEN ROTATOR CUFF REPAIR Right 2004   TONSILLECTOMY     TRANSTHORACIC ECHOCARDIOGRAM  06-07-2006   normal study/  ef 60-65%   TUBAL LIGATION Bilateral 1995   Past Medical History:  Diagnosis Date   Anxiety    Anxiety disorder    Arthritis    Blepharitis    Chronic low back pain    Depression    Diabetes mellitus without complication (HCC)    Dyslipidemia    Fibromyalgia    GERD (gastroesophageal reflux disease)    Headache    hx migraines   History of kidney stones    History of panic attacks    History of renal calculi    IBS (irritable bowel syndrome)    Interstitial cystitis    Lupus ([place])    tested positive for antibodies for lupus. 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   BP 104/65    Pulse 68    Ht 5\' 3"   (1.6 m)    Wt 231 lb (104.8 kg)    SpO2 95%    BMI 40.92 kg/m   Opioid Risk Score:   Fall Risk Score:  `1  Depression screen PHQ 2/9  Depression screen [facility] 2/9 08/23/2021 07/24/2021 05/25/2021 04/30/2021 12/04/2020 11/23/2020 09/11/2020  Decreased Interest 0 0 0 0 0 0 0  Down, Depressed, Hopeless 0 0 0 0 0 0 0  PHQ - 2 Score 0 0 0 0 0 0 0  Altered sleeping - - - - - - -  Tired, decreased energy - - - - - - -  Change in appetite - - - - - - -  Feeling bad or failure about yourself  - - - - - - -  Trouble concentrating - - - - - - -  Moving slowly or fidgety/restless - - - - - - -  Suicidal thoughts - - - - - - -  PHQ-9 Score - - - - - - -  Some recent data might be hidden    Review of Systems  Musculoskeletal:        Pain all over  All other systems reviewed and are negative.     Objective:   Physical Exam Vitals and nursing note reviewed.  Constitutional:      Appearance: Normal appearance.  Neck:     Comments: Cervical Paraspinal Tenderness: C-5-C-6 Cardiovascular:     Rate and Rhythm: Normal rate and regular rhythm.     Pulses: Normal pulses.     Heart sounds: Normal heart sounds.  Musculoskeletal:     Cervical back: Normal range of motion and neck supple.     Comments: Normal Muscle Bulk and Muscle Testing Reveals: Upper Extremities: Full ROM and Muscle Strength 5/5 Bilateral AC Joint Tenderness Thoracic Paraspinal Tenderness: T-7-T-9  Lumbar Paraspinal Tenderness: L-4-L-5 Left Greater Trochanter Tenderness Lower Extremities: Full ROM and Muscle Strength 5/5 Arises from chair with ease Narrow Based  Gait     Skin:    General: Skin is warm and dry.  Neurological:     Mental Status: She is alert and oriented to person, place, and time.  Psychiatric:        Mood and Affect: Mood normal.        Behavior: Behavior normal.         Assessment & Plan:  1. Lumbago/ Lumbar Spondylosis/ Left Lumbar Radiculitis: Continue to Monitor. 09/20/2021. Refilled: HYDROcodone  10/325mg  one tablet every 8 hours as needed #90.  We will continue the opioid monitoring program, this consists of regular clinic visits, examinations, urine drug screen, pill counts as well as use of [place] Controlled Substance Reporting system. A 12 month History has been reviewed on the [place] on 09/20/2021. 2. Fibromyalgia. Continue Current exercise Regime. 09/20/2021 3. Anxiety and depression:Psychiatry Following: Counselor Jessica  at the [facility] . Continue Counseling at [facility]. 09/20/2021 4. Migraines: On Maxalt. Neurology Following. 09/20/2021 5. OSA : PCP Following. Continue to Monitor. 09/20/2021 6. Obesity: She has a scheduled appointmne twith Dr Ranell Patrick this month. Following Healthy Diet Regimen and Continue HEP as Tolerated. 09/20/2021 7. Status Post Cervical Spinal Fusion: C5C6- C-6- C-7 Anterior Cervical Discectomy Fusion and Allograft Plate: Dr. Lorin Mercy Following. 09/20/2021 8. Cervicalgia/ Cervical Radiculitis/ S/P Cervical Spinal Fusion:  Continue to Monitor Dr. Lorin Mercy Following. 09/20/2021 9. Muscle Spasm: Continue Tizanidine as needed.09/20/2021 10.Hereditary and Idiopathic Peripheral Neuropathy Continue with Tens Unit. 09/20/2021. 11.Right   Greater Trochanteric Bursitis: . Continue to Alternate Ice and heat Therapy. Continue to Monitor. 09/20/2021. 12. Chronic Bilateral Knee Pain: No Complaints Today. Continue HEP as Tolerated. Continue to Monitor. 09/20/2021 13. Lateral Epicondylitis of Right Elbow: No complaints today.  Ortho Following. S/P   06/26/2020 Right lateral epicondyle debridement, drilling and repair  By Dr Lorin Mercy. 14. Neuralgia/ Right Facial Droop: Neurology following. Continue to monitor. 09/20/2021. 15. Polyarthralgia: No complaints today. Continue HEP as tolerated. Continue to monitor. 09/20/2021   F/U in 1 month .

## 2021-09-25 DIAGNOSIS — F411 Generalized anxiety disorder: Secondary | ICD-10-CM | POA: Diagnosis not present

## 2021-09-25 DIAGNOSIS — Z8669 Personal history of other diseases of the nervous system and sense organs: Secondary | ICD-10-CM | POA: Diagnosis not present

## 2021-09-25 DIAGNOSIS — M792 Neuralgia and neuritis, unspecified: Secondary | ICD-10-CM | POA: Diagnosis not present

## 2021-09-25 DIAGNOSIS — G479 Sleep disorder, unspecified: Secondary | ICD-10-CM | POA: Diagnosis not present

## 2021-09-25 DIAGNOSIS — R4586 Emotional lability: Secondary | ICD-10-CM | POA: Diagnosis not present

## 2021-09-25 DIAGNOSIS — R519 Headache, unspecified: Secondary | ICD-10-CM | POA: Diagnosis not present

## 2021-09-26 ENCOUNTER — Other Ambulatory Visit: Payer: Self-pay | Admitting: Neurology

## 2021-09-26 DIAGNOSIS — M79605 Pain in left leg: Secondary | ICD-10-CM

## 2021-10-01 ENCOUNTER — Encounter: Payer: Self-pay | Admitting: *Deleted

## 2021-10-03 ENCOUNTER — Ambulatory Visit (INDEPENDENT_AMBULATORY_CARE_PROVIDER_SITE_OTHER): Payer: Medicare Other

## 2021-10-03 ENCOUNTER — Other Ambulatory Visit: Payer: Self-pay

## 2021-10-03 ENCOUNTER — Ambulatory Visit
Admission: RE | Admit: 2021-10-03 | Discharge: 2021-10-03 | Disposition: A | Discharge status: Home / Self Care | Payer: Medicare Other | Source: Ambulatory Visit | Attending: Neurology | Admitting: Neurology

## 2021-10-03 DIAGNOSIS — J301 Allergic rhinitis due to pollen: Secondary | ICD-10-CM | POA: Diagnosis not present

## 2021-10-03 DIAGNOSIS — M79662 Pain in left lower leg: Secondary | ICD-10-CM | POA: Diagnosis not present

## 2021-10-03 DIAGNOSIS — M79605 Pain in left leg: Secondary | ICD-10-CM | POA: Insufficient documentation

## 2021-10-04 ENCOUNTER — Ambulatory Visit: Payer: Medicare Other

## 2021-10-08 DIAGNOSIS — I700 Atherosclerosis of aorta: Secondary | ICD-10-CM | POA: Diagnosis not present

## 2021-10-08 DIAGNOSIS — R072 Precordial pain: Secondary | ICD-10-CM | POA: Diagnosis not present

## 2021-10-09 ENCOUNTER — Other Ambulatory Visit: Payer: Self-pay

## 2021-10-09 ENCOUNTER — Inpatient Hospital Stay: Payer: Medicare Other

## 2021-10-09 ENCOUNTER — Encounter: Payer: Self-pay | Admitting: Oncology

## 2021-10-09 ENCOUNTER — Inpatient Hospital Stay: Payer: Medicare Other | Attending: Oncology | Admitting: Oncology

## 2021-10-09 VITALS — BP 159/96 | HR 71 | Temp 97.7°F | Wt 229.0 lb

## 2021-10-09 DIAGNOSIS — Z7951 Long term (current) use of inhaled steroids: Secondary | ICD-10-CM | POA: Diagnosis not present

## 2021-10-09 DIAGNOSIS — F419 Anxiety disorder, unspecified: Secondary | ICD-10-CM | POA: Insufficient documentation

## 2021-10-09 DIAGNOSIS — R9082 White matter disease, unspecified: Secondary | ICD-10-CM | POA: Insufficient documentation

## 2021-10-09 DIAGNOSIS — Z8249 Family history of ischemic heart disease and other diseases of the circulatory system: Secondary | ICD-10-CM | POA: Diagnosis not present

## 2021-10-09 DIAGNOSIS — Z79899 Other long term (current) drug therapy: Secondary | ICD-10-CM | POA: Diagnosis not present

## 2021-10-09 DIAGNOSIS — M329 Systemic lupus erythematosus, unspecified: Secondary | ICD-10-CM | POA: Insufficient documentation

## 2021-10-09 DIAGNOSIS — E119 Type 2 diabetes mellitus without complications: Secondary | ICD-10-CM | POA: Diagnosis not present

## 2021-10-09 DIAGNOSIS — R5383 Other fatigue: Secondary | ICD-10-CM

## 2021-10-09 LAB — CBC WITH DIFFERENTIAL/PLATELET
Abs Immature Granulocytes: 0.05 10*3/uL (ref 0.00–0.07)
Basophils Absolute: 0.2 10*3/uL — ABNORMAL HIGH (ref 0.0–0.1)
Basophils Relative: 2 %
Eosinophils Absolute: 0.2 10*3/uL (ref 0.0–0.5)
Eosinophils Relative: 2 %
HCT: 45.9 % (ref 36.0–46.0)
Hemoglobin: 15.8 g/dL — ABNORMAL HIGH (ref 12.0–15.0)
Immature Granulocytes: 1 %
Lymphocytes Relative: 24 %
Lymphs Abs: 2.1 10*3/uL (ref 0.7–4.0)
MCH: 29.4 pg (ref 26.0–34.0)
MCHC: 34.4 g/dL (ref 30.0–36.0)
MCV: 85.5 fL (ref 80.0–100.0)
Monocytes Absolute: 0.5 10*3/uL (ref 0.1–1.0)
Monocytes Relative: 6 %
Neutro Abs: 5.9 10*3/uL (ref 1.7–7.7)
Neutrophils Relative %: 65 %
Platelets: 298 10*3/uL (ref 150–400)
RBC: 5.37 MIL/uL — ABNORMAL HIGH (ref 3.87–5.11)
RDW: 12 % (ref 11.5–15.5)
WBC: 8.9 10*3/uL (ref 4.0–10.5)
nRBC: 0 % (ref 0.0–0.2)

## 2021-10-09 LAB — COMPREHENSIVE METABOLIC PANEL
ALT: 19 U/L (ref 0–44)
AST: 16 U/L (ref 15–41)
Albumin: 3.8 g/dL (ref 3.5–5.0)
Alkaline Phosphatase: 74 U/L (ref 38–126)
Anion gap: 7 (ref 5–15)
BUN: 14 mg/dL (ref 6–20)
CO2: 25 mmol/L (ref 22–32)
Calcium: 9.1 mg/dL (ref 8.9–10.3)
Chloride: 105 mmol/L (ref 98–111)
Creatinine, Ser: 0.72 mg/dL (ref 0.44–1.00)
GFR, Estimated: >60 mL/min (ref >60)
Glucose, Bld: 125 mg/dL — ABNORMAL HIGH (ref 70–99)
Potassium: 4.1 mmol/L (ref 3.5–5.1)
Sodium: 137 mmol/L (ref 135–145)
Total Bilirubin: 0.7 mg/dL (ref 0.3–1.2)
Total Protein: 7.3 g/dL (ref 6.5–8.1)

## 2021-10-09 LAB — ANTITHROMBIN III: AntiThromb III Func: 110 % (ref 75–120)

## 2021-10-09 NOTE — Progress Notes (Signed)
Hematology/Oncology Consult note [phone] [phone]      Patient Care Team: Cyndi Bender, PA-C as PCP - General (Physician Assistant) Earlie Server, MD as Consulting Physician (Hematology) Anabel Bene, MD as Consulting Physician (Neurology) Andrez Grime, MD as Consulting Physician (Cardiology) Mikel Cella, MD as Consulting Physician (Neurology)  REFERRING PROVIDER: Wonda Cerise, PA-C  CHIEF COMPLAINTS/REASON FOR VISIT:  Evaluation of family history of clotting disorder.  HISTORY OF PRESENTING ILLNESS:   Karina Greer is a  56 y.o.  female with PMH listed below was seen in consultation at the request of  Wonda Cerise, PA-C  for evaluation of family history of clotting disorder.  Patient follows up with neurology Dr. 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 Patient has a history of neuralgia, migraine, fibromyalgia, anxiety, depression.  Her previous MRI of the brain suggested increased vascular disease for his age, white matter hyperintensities.  Her MRI done on 08/12/2020 was not available to me.  Per her neurologist note, there were progression in the white matter changes in the anterior temporal lobe suggestive of the O'Sullivan's sign as well as involvement of the external capsule left more than right. These neuroimaging findings can be seen in patients with CADASIL.  Also report a family history of blood clots in her mom.  Details are not known, provoked versus unprovoked.  She remembers that her mother has been on blood thinners.  No other family members with blood clots.  Patient denies any previous personal history of blood clots.  Patient was recommended to get prednisone testing however per patient, there was some issue about testing approval. Patient was referred to establish care with hematology for hypercoagulable work-up for stroke.  Lupus was listed on her medical history, with a note saying that she was tested positive.   Patient reports being seen by rheumatology Dr. Jefm Bryant and had a blood work done-patient reports that she may have lost follow-up for Dr. Jefm Bryant never asked her to follow-up.  Review of Systems  Constitutional:  Negative for appetite change, chills, fatigue and fever.  HENT:   Negative for hearing loss and voice change.   Eyes:  Negative for eye problems.  Respiratory:  Negative for chest tightness and cough.   Cardiovascular:  Negative for chest pain.  Gastrointestinal:  Negative for abdominal distention, abdominal pain and blood in stool.  Endocrine: Negative for hot flashes.  Genitourinary:  Negative for difficulty urinating and frequency.        Migraine  Musculoskeletal:  Positive for back pain and myalgias. Negative for arthralgias.  Skin:  Negative for itching and rash.  Neurological:  Negative for extremity weakness.       Difficulty with bloatedness thought processing  Hematological:  Negative for adenopathy.  Psychiatric/Behavioral:  Negative for confusion.    MEDICAL HISTORY:  Past Medical History:  Diagnosis Date   Anxiety    Anxiety disorder    Arthritis    Blepharitis    Chronic low back pain    Chronic pain    Depression    Diabetes mellitus without complication (HCC)    Dyslipidemia    Family history of blood clots    Fibromyalgia    GERD (gastroesophageal reflux disease)    Headache    hx migraines   History of kidney stones    History of panic attacks    History of renal calculi    IBS (irritable bowel syndrome)    Interstitial cystitis    Lupus (  [place])    tested positive for antibodies for lupus. dr to do further tests   Menorrhagia    OSA on CPAP    not used cpap for several weeks   Pneumonia    hx   PONV (postoperative nausea and vomiting)    RLS (restless legs syndrome)    Rosacea    Seasonal asthma    SI (sacroiliac) joint dysfunction    SUI (stress urinary incontinence, female)    UTI (lower urinary tract infection)    hx   White  matter abnormality on MRI of brain 02/23/2013    SURGICAL HISTORY: Past Surgical History:  Procedure Laterality Date   ACHILLES TENDON SURGERY Right 3/16   ANTERIOR CERVICAL DECOMP/DISCECTOMY FUSION N/A 03/11/2016   Procedure: C5-6, C6-7 Anterior Cervical Discectomy and Fusion, Allograft, Plate;  Surgeon: Marybelle Killings, MD;  Location: [facility];  Service: Orthopedics;  Laterality: N/A;   CHOLECYSTECTOMY  2014   COLONOSCOPY WITH PROPOFOL N/A 08/25/2017   Procedure: COLONOSCOPY WITH PROPOFOL;  Surgeon: Manya Silvas, MD;  Location[facility] ENDOSCOPY;  Service: Endoscopy;  Laterality: N/A;   CYSTO WITH HYDRODISTENSION N/A 01/26/2014   Procedure: CYSTOSCOPY/HYDRODISTENSION;  Surgeon: Bernestine Amass, MD;  Location: [facility];  Service: Urology;  Laterality: N/A;   CYSTO/  URETHRAL DILATION/  HYDRODISTENTION/   INSTILLATION THERAPY  07-16-2010//   12-30-2007//   10-27-2006   ELBOW SURGERY Right    EXERCISE TOLERENCE TEST  10-12-2010   NEGATIVE  ADEQUATE ETT/  NO ISCHEMIA OR EVIDENCE HIGH GRADE OBSTRUCTIVE CAD/  NO FURTHER TEST NEEDED   HYSTEROSCOPY W/  Maish Vaya ENDOMETRIAL ABLATION  2014   LAPAROSCOPIC OVARIAN CYST BX  2005   AND  URETEROSCOPIC LASER LITHO  STONE EXTRACTION   SHOULDER OPEN ROTATOR CUFF REPAIR Right 2004   TONSILLECTOMY     TRANSTHORACIC ECHOCARDIOGRAM  06-07-2006   normal study/  ef 60-65%   TUBAL LIGATION Bilateral 1995    SOCIAL HISTORY: Social History   Socioeconomic History   Marital status: Divorced    Spouse name: Not on file   Number of children: 3   Years of education: HS   Highest education level: 12th grade  Occupational History   Occupation: Merchandiser, retail: UNEMPLOYED    Comment: Disability  Tobacco Use   Smoking status: Never   Smokeless tobacco: Never  Vaping Use   Vaping Use: Never used  Substance and Sexual Activity   Alcohol use: No   Drug use: No   Sexual activity: Not on file  Other Topics Concern   Not on file  Social  History Narrative   Patient is right-handed. She avoids caffeine. She has recently been using the treadmill.   Social Determinants of Health   Financial Resource Strain: Not on file  Food Insecurity: Not on file  Transportation Needs: Not on file  Physical Activity: Not on file  Stress: Not on file  Social Connections: Not on file  Intimate Partner Violence: Not on file    FAMILY HISTORY: Family History  Problem Relation Age of Onset   Hypertension Mother    Diabetes Father    Brain cancer Father    Clotting disorder Father        blood clots   Fibromyalgia Sister    Suicidality Sister    Hypertension Other        Cancer, Cerebrovascular disease run on mother side of family    ALLERGIES:  is allergic to alendronate sodium, doxycycline,  milnacipran hcl, contrast media [iodinated contrast media], elavil [amitriptyline hcl], gadolinium derivatives, tramadol, codeine sulfate, cymbalta [duloxetine hcl], duloxetine, gabapentin, lamotrigine, morphine, nsaids, other, skelaxin, soolantra [ivermectin], tessalon [benzonatate], oxycodone, oxycodone-acetaminophen, and tetracycline.  MEDICATIONS:  Current Outpatient Medications  Medication Sig Dispense Refill   ACCU-CHEK GUIDE test strip USE 1 (ONE) EACH DAILY FOR GLUCOSE MONITORING     albuterol (PROVENTIL) (2.5 MG/3ML) 0.083% nebulizer solution USE 1 VIAL IN NEBULIZER EVERY 6 HOURS 120 mL 11   albuterol (VENTOLIN HFA) 108 (90 Base) MCG/ACT inhaler TAKE 2 PUFFS BY MOUTH EVERY 4 TO 6 HOURS AS NEEDED 8.5 each 6   b complex vitamins tablet Take 1 tablet by mouth daily.     calcitonin, salmon, (MIACALCIN/FORTICAL) 200 UNIT/ACT nasal spray Place 1 spray into alternate nostrils daily.      chlorhexidine (PERIDEX) 0.12 % solution SMARTSIG:By Mouth     Cholecalciferol (VITAMIN D) 50 MCG (2000 UT) CAPS Take 1 capsule by mouth once a week.     Cholecalciferol 25 MCG (1000 UT) tablet Take by mouth.     Continuous Blood Gluc Receiver (FREESTYLE LIBRE  2 READER) DEVI Use as directed daily 1 each 3   Continuous Blood Gluc Sensor (FREESTYLE LIBRE 2 SENSOR) MISC Use as directed daily. 3 each 3   DEXILANT 60 MG capsule Take 1 capsule by mouth daily as needed.     dicyclomine (BENTYL) 10 MG capsule Take 10 mg by mouth 2 (two) times daily as needed.     EPINEPHrine (EPIPEN 2-PAK) 0.3 mg/0.3 mL IJ SOAJ injection Use as directed 1 each 1   ergocalciferol (VITAMIN D2) 1.25 MG (50000 UT) capsule Take by mouth.     ezetimibe (ZETIA) 10 MG tablet Take 10 mg by mouth daily. 1 Tablet(s) By Mouth Daily     fexofenadine (ALLEGRA) 180 MG tablet Take 180 mg by mouth daily.     FLOVENT HFA 110 MCG/ACT inhaler INHALE 1 PUFF BY MOUTH TWICE A DAY 12 each 5   HYDROcodone-acetaminophen (NORCO) 10-325 MG tablet Take 1 tablet by mouth every 8 (eight) hours as needed for pain. 90 tablet 0   hydrocortisone valerate cream (WESTCORT) 0.2 % Apply topically 2 (two) times daily.     ketoconazole (NIZORAL) 2 % shampoo Apply 1 application topically 2 (two) times a week.      lidocaine (LIDODERM) 5 % Place 1 patch onto the skin daily. Remove & Discard patch within 12 hours or as directed by MD 30 patch 0   meclizine (ANTIVERT) 25 MG tablet Take by mouth.     ondansetron (ZOFRAN-ODT) 8 MG disintegrating tablet Take 8 mg by mouth 3 (three) times daily.     PRESCRIPTION MEDICATION every 14 (fourteen) days. 2 ALLERGY INJECTIONS EVERY 2 WKS     rizatriptan (MAXALT-MLT) 10 MG disintegrating tablet SMARTSIG:1 Tablet(s) By Mouth 1 to 2 Times Daily     SPIRULINA PO Take 1 capsule by mouth daily.     tiZANidine (ZANAFLEX) 2 MG tablet Take 1 tablet (2 mg total) by mouth every 8 (eight) hours as needed. 50 tablet 2   valACYclovir (VALTREX) 1000 MG tablet Take by mouth.     azelastine (ASTELIN) 0.1 % nasal spray USE 2 SPRAYS INTO EACH NOSTRIL TWICE A DAY 16 mL 1   clonazePAM (KLONOPIN) 0.5 MG tablet Take 0.25 mg by mouth 2 (two) times daily.     pravastatin (PRAVACHOL) 20 MG tablet Take 1  tablet by mouth at bedtime.     risperiDONE (RISPERDAL) 0.25  MG tablet Take by mouth.     No current facility-administered medications for this visit.     PHYSICAL EXAMINATION: ECOG PERFORMANCE STATUS: 1 - Symptomatic but completely ambulatory Vitals:   10/09/21 0932  BP: (!) 159/96  Pulse: 71  Temp: 97.7 F (36.5 C)   Filed Weights   10/09/21 0932  Weight: 229 lb (103.9 kg)    Physical Exam Constitutional:      General: She is not in acute distress.    Appearance: She is obese.  HENT:     Head: Normocephalic and atraumatic.  Eyes:     General: No scleral icterus. Cardiovascular:     Rate and Rhythm: Normal rate and regular rhythm.     Heart sounds: Normal heart sounds.  Pulmonary:     Effort: Pulmonary effort is normal. No respiratory distress.     Breath sounds: No wheezing.  Abdominal:     General: Bowel sounds are normal. There is no distension.     Palpations: Abdomen is soft.  Musculoskeletal:        General: No deformity. Normal range of motion.     Cervical back: Normal range of motion and neck supple.  Skin:    General: Skin is warm and dry.     Findings: No erythema or rash.  Neurological:     Mental Status: She is alert and oriented to person, place, and time. Mental status is at baseline.     Cranial Nerves: No cranial nerve deficit.     Coordination: Coordination normal.  Psychiatric:        Mood and Affect: Mood normal.    LABORATORY DATA:  I have reviewed the data as listed Lab Results  Component Value Date   WBC 8.9 10/09/2021   HGB 15.8 (H) 10/09/2021   HCT 45.9 10/09/2021   MCV 85.5 10/09/2021   PLT 298 10/09/2021   Recent Labs    10/09/21 1005  NA 137  K 4.1  CL 105  CO2 25  GLUCOSE 125*  BUN 14  CREATININE 0.72  CALCIUM 9.1  GFRNONAA >60  PROT 7.3  ALBUMIN 3.8  AST 16  ALT 19  ALKPHOS 74  BILITOT 0.7   Iron/TIBC/Ferritin/ %Sat No results found for: IRON, TIBC, FERRITIN, IRONPCTSAT    RADIOGRAPHIC STUDIES: I have  personally reviewed the radiological images as listed and agreed with the findings in the report. US Venous Img Lower Unilateral Left (DVT)  Result Date: 10/03/2021 CLINICAL DATA:  LEFT lower extremity pain. EXAM: LEFT LOWER EXTREMITY VENOUS DOPPLER ULTRASOUND TECHNIQUE: Gray-scale sonography with compression, as well as color and duplex ultrasound, were performed to evaluate the deep venous system(s) from the level of the common femoral vein through the popliteal and proximal calf veins. COMPARISON:  CT AP, 12/25/2017 FINDINGS: VENOUS Normal compressibility of the LEFT common femoral, superficial femoral, and popliteal veins, as well as the visualized calf veins. Visualized portions of profunda femoral vein and great saphenous vein unremarkable. No filling defects to suggest DVT on grayscale or color Doppler imaging. Doppler waveforms show normal direction of venous flow, normal respiratory plasticity and response to augmentation. Limited views of the contralateral common femoral vein are unremarkable. OTHER No evidence of superficial thrombophlebitis or abnormal fluid collection. Limitations: none IMPRESSION: No evidence of femoropopliteal DVT within the LEFT lower extremity. Michaelle Birks, MD [facility] Electronically Signed   By: Michaelle Birks M.D.   On: 10/03/2021 15:16      ASSESSMENT &  PLAN:  1. Family history of blood clots   2. 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    #Patient has family history of blood clots, without personal history of deep vein thrombosis. White matter abnormality on MRI of the brain. I will check CBC, CMP, factor V Leiden mutation,  prothrombin gene mutation, protein C activity, Antithrombin III, protein S antigen.  ?  History of positive lupus testing.  I was not able to see previous positive for lupus testing.  She was seen by Dr. Jefm Bryant on 06/24/2018.  I recommend patient to follow-up with Dr. Jefm Bryant for  clarification of the questionable diagnosis of lupus.  With her lack of no history of deep vein thrombosis, I will hold off repeating antiphospholipid panel at this point.  White matter disease on MRI brain.  Continue follow-up with neurology  Orders Placed This Encounter  Procedures   Comprehensive metabolic panel    Standing Status:   Future    Number of Occurrences:   1    Standing Expiration Date:   10/09/2022   CBC with Differential/Platelet    Standing Status:   Future    Number of Occurrences:   1    Standing Expiration Date:   10/09/2022   Prothrombin gene mutation    Standing Status:   Future    Number of Occurrences:   1    Standing Expiration Date:   10/09/2022   Factor 5 leiden    Standing Status:   Future    Number of Occurrences:   1    Standing Expiration Date:   10/09/2022   Protein C activity    Standing Status:   Future    Number of Occurrences:   1    Standing Expiration Date:   10/09/2022   Antithrombin III    Standing Status:   Future    Number of Occurrences:   1    Standing Expiration Date:   10/09/2022   Protein S, total and free    Standing Status:   Future    Number of Occurrences:   1    Standing Expiration Date:   10/09/2022    All questions were answered. The patient knows to call the clinic with any problems questions or concerns.  cc Wonda Cerise, PA-C    Return of visit: To be determined.  If testing were negative, she does not need to follow-up with me. Thank you for this kind referral and the opportunity to participate in the care of this patient. A copy of today's note is routed to referring provider   Earlie Server, MD, PhD [facility] Hematology Oncology 10/09/2021

## 2021-10-10 LAB — PROTEIN S, TOTAL AND FREE
Protein S Ag, Free: 131 % (ref 61–136)
Protein S Ag, Total: 129 % (ref 60–150)

## 2021-10-10 LAB — PROTEIN C ACTIVITY: Protein C Activity: 150 % (ref 73–180)

## 2021-10-15 LAB — PROTHROMBIN GENE MUTATION

## 2021-10-15 LAB — FACTOR 5 LEIDEN

## 2021-10-17 ENCOUNTER — Ambulatory Visit: Payer: Medicare Other

## 2021-10-18 ENCOUNTER — Ambulatory Visit: Payer: Medicare Other | Admitting: Internal Medicine

## 2021-10-18 ENCOUNTER — Ambulatory Visit (INDEPENDENT_AMBULATORY_CARE_PROVIDER_SITE_OTHER): Payer: Medicare Other

## 2021-10-18 ENCOUNTER — Other Ambulatory Visit: Payer: Self-pay

## 2021-10-18 DIAGNOSIS — J301 Allergic rhinitis due to pollen: Secondary | ICD-10-CM | POA: Diagnosis not present

## 2021-10-19 ENCOUNTER — Other Ambulatory Visit (HOSPITAL_COMMUNITY): Payer: Self-pay

## 2021-10-19 ENCOUNTER — Encounter: Payer: Medicare Other | Attending: Physical Medicine & Rehabilitation | Admitting: Registered Nurse

## 2021-10-19 ENCOUNTER — Other Ambulatory Visit: Payer: Self-pay

## 2021-10-19 VITALS — BP 139/87 | HR 71 | Ht 63.0 in | Wt 229.0 lb

## 2021-10-19 DIAGNOSIS — M542 Cervicalgia: Secondary | ICD-10-CM | POA: Insufficient documentation

## 2021-10-19 DIAGNOSIS — Z5181 Encounter for therapeutic drug level monitoring: Secondary | ICD-10-CM | POA: Insufficient documentation

## 2021-10-19 DIAGNOSIS — M48062 Spinal stenosis, lumbar region with neurogenic claudication: Secondary | ICD-10-CM | POA: Diagnosis not present

## 2021-10-19 DIAGNOSIS — M7062 Trochanteric bursitis, left hip: Secondary | ICD-10-CM | POA: Diagnosis not present

## 2021-10-19 DIAGNOSIS — Z79891 Long term (current) use of opiate analgesic: Secondary | ICD-10-CM | POA: Insufficient documentation

## 2021-10-19 DIAGNOSIS — G894 Chronic pain syndrome: Secondary | ICD-10-CM | POA: Insufficient documentation

## 2021-10-19 DIAGNOSIS — M5416 Radiculopathy, lumbar region: Secondary | ICD-10-CM | POA: Diagnosis not present

## 2021-10-19 DIAGNOSIS — M797 Fibromyalgia: Secondary | ICD-10-CM | POA: Insufficient documentation

## 2021-10-19 MED ORDER — HYDROCODONE-ACETAMINOPHEN 10-325 MG PO TABS
1.0000 | ORAL_TABLET | Freq: Three times a day (TID) | ORAL | 0 refills | Status: DC | PRN
  Filled 2021-10-19: qty 90, 30d supply, fill #0

## 2021-10-19 NOTE — Progress Notes (Signed)
Subjective:    Patient ID: Karina Greer, female    DOB: 1965-11-22, 56 y.o.   MRN: [id number]  HPI: Karina Greer is a 56 y.o. female who returns for follow up appointment for chronic pain and medication refill. She states her pain is located in her neck,.lower back radiating into her left lower extremity to her left knee. She rates her pain 7. Her current exercise regime is walking and performing stretching exercises.  Ms. [place]ozo Morphine equivalent is 30.00 MME.   Oral Swab was Performed today.    Pain Inventory Average Pain 9 Pain Right Now 7 My pain is constant and aching  In the last 24 hours, has pain interfered with the following? General activity 8 Relation with others 8 Enjoyment of life 8 What TIME of day is your pain at its worst? morning , daytime, evening, and night Sleep (in general) Fair  Pain is worse with: walking, bending, sitting, inactivity, standing, unsure, and some activites Pain improves with: rest, heat/ice, therapy/exercise, pacing activities, medication, TENS, and injections Relief from Meds: 5  Family History  Problem Relation Age of Onset   Hypertension Mother    Diabetes Father    Brain cancer Father    Clotting disorder Father        blood clots   Fibromyalgia Sister    Suicidality Sister    Hypertension Other        Cancer, Cerebrovascular disease run on mother side of family   Social History   Socioeconomic History   Marital status: Divorced    Spouse name: Not on file   Number of children: 3   Years of education: HS   Highest education level: 12th grade  Occupational History   Occupation: Merchandiser, retail: UNEMPLOYED    Comment: Disability  Tobacco Use   Smoking status: Never   Smokeless tobacco: Never  Vaping Use   Vaping Use: Never used  Substance and Sexual Activity   Alcohol use: No   Drug use: No   Sexual activity: Not on file  Other Topics Concern   Not on file  Social History Narrative   Patient is  right-handed. She avoids caffeine. She has recently been using the treadmill.   Social Determinants of Health   Financial Resource Strain: Not on file  Food Insecurity: Not on file  Transportation Needs: Not on file  Physical Activity: Not on file  Stress: Not on file  Social Connections: Not on file   Past Surgical History:  Procedure Laterality Date   ACHILLES TENDON SURGERY Right 3/16   ANTERIOR CERVICAL DECOMP/DISCECTOMY FUSION N/A 03/11/2016   Procedure: C5-6, C6-7 Anterior Cervical Discectomy and Fusion, Allograft, Plate;  Surgeon: Marybelle Killings, MD;  Location: [place];  Service: Orthopedics;  Laterality: N/A;   CHOLECYSTECTOMY  2014   COLONOSCOPY WITH PROPOFOL N/A 08/25/2017   Procedure: COLONOSCOPY WITH PROPOFOL;  Surgeon: Manya Silvas, MD;  Location: [facility] ENDOSCOPY;  Service: Endoscopy;  Laterality: N/A;   CYSTO WITH HYDRODISTENSION N/A 01/26/2014   Procedure: CYSTOSCOPY/HYDRODISTENSION;  Surgeon: Bernestine Amass, MD;  Location: [facility];  Service: Urology;  Laterality: N/A;   CYSTO/  URETHRAL DILATION/  HYDRODISTENTION/   INSTILLATION THERAPY  07-16-2010//   12-30-2007//   10-27-2006   ELBOW SURGERY Right    EXERCISE TOLERENCE TEST  10-12-2010   NEGATIVE  ADEQUATE ETT/  NO ISCHEMIA OR EVIDENCE HIGH GRADE OBSTRUCTIVE CAD/  NO FURTHER TEST NEEDED   HYSTEROSCOPY W/  [place] ENDOMETRIAL ABLATION  2014   LAPAROSCOPIC OVARIAN CYST BX  2005   AND  URETEROSCOPIC LASER LITHO  STONE EXTRACTION   SHOULDER OPEN ROTATOR CUFF REPAIR Right 2004   TONSILLECTOMY     TRANSTHORACIC ECHOCARDIOGRAM  06-07-2006   normal study/  ef 60-65%   TUBAL LIGATION Bilateral 1995   Past Surgical History:  Procedure Laterality Date   ACHILLES TENDON SURGERY Right 3/16   ANTERIOR CERVICAL DECOMP/DISCECTOMY FUSION N/A 03/11/2016   Procedure: C5-6, C6-7 Anterior Cervical Discectomy and Fusion, Allograft, Plate;  Surgeon: Marybelle Killings, MD;  Location: [place];  Service: Orthopedics;   Laterality: N/A;   CHOLECYSTECTOMY  2014   COLONOSCOPY WITH PROPOFOL N/A 08/25/2017   Procedure: COLONOSCOPY WITH PROPOFOL;  Surgeon: Manya Silvas, MD;  Location:  [facility] ENDOSCOPY;  Service: Endoscopy;  Laterality: N/A;   CYSTO WITH HYDRODISTENSION N/A 01/26/2014   Procedure: CYSTOSCOPY/HYDRODISTENSION;  Surgeon: Bernestine Amass, MD;  Location: [facility];  Service: Urology;  Laterality: N/A;   CYSTO/  URETHRAL DILATION/  HYDRODISTENTION/   INSTILLATION THERAPY  07-16-2010//   12-30-2007//   10-27-2006   ELBOW SURGERY Right    EXERCISE TOLERENCE TEST  10-12-2010   NEGATIVE  ADEQUATE ETT/  NO ISCHEMIA OR EVIDENCE HIGH GRADE OBSTRUCTIVE CAD/  NO FURTHER TEST NEEDED   HYSTEROSCOPY W/  [place] ENDOMETRIAL ABLATION  2014   LAPAROSCOPIC OVARIAN CYST BX  2005   AND  URETEROSCOPIC LASER LITHO  STONE EXTRACTION   SHOULDER OPEN ROTATOR CUFF REPAIR Right 2004   TONSILLECTOMY     TRANSTHORACIC ECHOCARDIOGRAM  06-07-2006   normal study/  ef 60-65%   TUBAL LIGATION Bilateral 1995   Past Medical History:  Diagnosis Date   Anxiety    Anxiety disorder    Arthritis    Blepharitis    Chronic low back pain    Chronic pain    Depression    Diabetes mellitus without complication (HCC)    Dyslipidemia    Family history of blood clots    Fibromyalgia    GERD (gastroesophageal reflux disease)    Headache    hx migraines   History of kidney stones    History of panic attacks    History of renal calculi    IBS (irritable bowel syndrome)    Interstitial cystitis    Lupus ([place])    tested positive for antibodies for lupus. dr to do further tests   Menorrhagia    OSA on CPAP    not used cpap for several weeks   Pneumonia    hx   PONV (postoperative nausea and vomiting)    RLS (restless legs syndrome)    Rosacea    Seasonal asthma    SI (sacroiliac) joint dysfunction    SUI (stress urinary incontinence, female)    UTI (lower urinary tract infection)    hx   White matter  abnormality on MRI of brain 02/23/2013   BP 139/87    Pulse 71    Ht 5\' 3"  (1.6 m)    Wt 229 lb (103.9 kg)    SpO2 97%    BMI 40.57 kg/m   Opioid Risk Score:   Fall Risk Score:  `1  Depression screen PHQ 2/9  Depression screen [facility] 2/9 10/19/2021 08/23/2021 07/24/2021 05/25/2021 04/30/2021 12/04/2020 11/23/2020  Decreased Interest 0 0 0 0 0 0 0  Down, Depressed, Hopeless 0 0 0 0 0 0 0  PHQ - 2 Score 0 0 0 0 0 0  0  Altered sleeping - - - - - - -  Tired, decreased energy - - - - - - -  Change in appetite - - - - - - -  Feeling bad or failure about yourself  - - - - - - -  Trouble concentrating - - - - - - -  Moving slowly or fidgety/restless - - - - - - -  Suicidal thoughts - - - - - - -  PHQ-9 Score - - - - - - -  Some recent data might be hidden      Review of Systems  Musculoskeletal:  Positive for arthralgias and myalgias.  All other systems reviewed and are negative.     Objective:   Physical Exam Vitals and nursing note reviewed.  Constitutional:      Appearance: Normal appearance. She is obese.  Neck:     Comments: Cervical Paraspinal Tenderness: C-5-C-6 Cardiovascular:     Rate and Rhythm: Normal rate and regular rhythm.     Pulses: Normal pulses.     Heart sounds: Normal heart sounds.  Pulmonary:     Effort: Pulmonary effort is normal.     Breath sounds: Normal breath sounds.  Musculoskeletal:     Cervical back: Normal range of motion and neck supple.     Comments: Normal Muscle Bulk and Muscle Testing Reveals:  Upper Extremities: Full ROM and Muscle Strength 5/5  Lumbar Paraspinal Tenderness: L-4-L-5 Left Greater Trochanter Tenderness Lower Extremities: Full ROM and Muscle Strength 5/5 Arises from chair with ease Narrow Based Gait     Skin:    General: Skin is warm and dry.  Neurological:     Mental Status: She is alert and oriented to person, place, and time.  Psychiatric:        Mood and Affect: Mood normal.        Behavior: Behavior normal.          Assessment & Plan:  1. Lumbago/ Lumbar Spondylosis/ Left Lumbar Radiculitis: Continue to Monitor. 01/27203. Refilled: HYDROcodone 10/325mg  one tablet every 8 hours as needed #90.  We will continue the opioid monitoring program, this consists of regular clinic visits, examinations, urine drug screen, pill counts as well as use of [place] Controlled Substance Reporting system. A 12 month History has been reviewed on the [place] on 10/19/2021. 2. Fibromyalgia. Continue Current exercise Regime. 10/19/2021 3. Anxiety and depression:Psychiatry Following: Counselor Jessica  at the [facility] . Continue Counseling at [facility]. 10/19/2021 4. Migraines: On [place]. Neurology Following. 10/19/2021 5. OSA : PCP Following. Continue to Monitor. 10/19/2021 6. Obesity: She has a scheduled appointmne twith Dr Ranell Patrick this month. Following Healthy Diet Regimen and Continue HEP as Tolerated. 10/19/2021 7. Status Post Cervical Spinal Fusion: C5C6- C-6- C-7 Anterior Cervical Discectomy Fusion and Allograft Plate: Dr. Lorin Mercy Following. 10/19/2021 8. Cervicalgia/ Cervical Radiculitis/ S/P Cervical Spinal Fusion:  Continue to Monitor Dr. Lorin Mercy Following. 10/19/2021 9. Muscle Spasm: Continue Tizanidine as needed.10/19/2021 10.Hereditary and Idiopathic Peripheral Neuropathy Continue with Tens Unit. 10/19/2020. 11.Right   Greater Trochanteric Bursitis: . Continue to Alternate Ice and heat Therapy. Continue to Monitor. 10/19/2021. 12. Chronic Bilateral Knee Pain: No Complaints Today. Continue HEP as Tolerated. Continue to Monitor. 10/19/2021 13. Lateral Epicondylitis of Right Elbow: No complaints today.  Ortho Following. S/P   06/26/2020 Right lateral epicondyle debridement, drilling and repair  By Dr Lorin Mercy. 14. Neuralgia/ Right Facial Droop: Neurology following. Continue to monitor. 10/19/2021. 15. Polyarthralgia:  No complaints today. Continue HEP as tolerated.  Continue to monitor. 10/19/2021   F/U in 1 month .

## 2021-10-21 ENCOUNTER — Encounter: Payer: Self-pay | Admitting: Registered Nurse

## 2021-10-23 ENCOUNTER — Telehealth: Payer: Self-pay

## 2021-10-23 DIAGNOSIS — R300 Dysuria: Secondary | ICD-10-CM | POA: Diagnosis not present

## 2021-10-23 DIAGNOSIS — M810 Age-related osteoporosis without current pathological fracture: Secondary | ICD-10-CM | POA: Diagnosis not present

## 2021-10-23 DIAGNOSIS — I10 Essential (primary) hypertension: Secondary | ICD-10-CM | POA: Diagnosis not present

## 2021-10-23 DIAGNOSIS — E119 Type 2 diabetes mellitus without complications: Secondary | ICD-10-CM | POA: Diagnosis not present

## 2021-10-23 DIAGNOSIS — R5383 Other fatigue: Secondary | ICD-10-CM | POA: Diagnosis not present

## 2021-10-23 DIAGNOSIS — Z6839 Body mass index (BMI) 39.0-39.9, adult: Secondary | ICD-10-CM | POA: Diagnosis not present

## 2021-10-23 DIAGNOSIS — E559 Vitamin D deficiency, unspecified: Secondary | ICD-10-CM | POA: Diagnosis not present

## 2021-10-23 DIAGNOSIS — E7800 Pure hypercholesterolemia, unspecified: Secondary | ICD-10-CM | POA: Diagnosis not present

## 2021-10-23 DIAGNOSIS — N390 Urinary tract infection, site not specified: Secondary | ICD-10-CM | POA: Diagnosis not present

## 2021-10-23 DIAGNOSIS — G4733 Obstructive sleep apnea (adult) (pediatric): Secondary | ICD-10-CM | POA: Diagnosis not present

## 2021-10-23 DIAGNOSIS — K219 Gastro-esophageal reflux disease without esophagitis: Secondary | ICD-10-CM | POA: Diagnosis not present

## 2021-10-23 DIAGNOSIS — G43009 Migraine without aura, not intractable, without status migrainosus: Secondary | ICD-10-CM | POA: Diagnosis not present

## 2021-10-23 NOTE — Telephone Encounter (Signed)
Called and informed patient of lab results and Dr. Collie Siad recommendation. Patient verbalized understanding.    Aby, please schedule patient for Lab/MD in 3 months. Please let notify patient. Patient informed.

## 2021-10-23 NOTE — Progress Notes (Signed)
Phone note created 

## 2021-10-23 NOTE — Telephone Encounter (Signed)
-----   Message from Earlie Server, MD sent at 10/22/2021 11:15 PM EST ----- Let patient know that hypercoagulable work-up is negative.  Incidental finding of slightly increased hemoglobin level.  No need for intervention at this point.  I recommend patient to follow-up with me in 3 months, repeat lab with CBC at that time, MD visit same day.

## 2021-10-24 DIAGNOSIS — R519 Headache, unspecified: Secondary | ICD-10-CM | POA: Diagnosis not present

## 2021-10-24 DIAGNOSIS — M792 Neuralgia and neuritis, unspecified: Secondary | ICD-10-CM | POA: Diagnosis not present

## 2021-10-24 DIAGNOSIS — E119 Type 2 diabetes mellitus without complications: Secondary | ICD-10-CM | POA: Diagnosis not present

## 2021-10-24 DIAGNOSIS — G479 Sleep disorder, unspecified: Secondary | ICD-10-CM | POA: Diagnosis not present

## 2021-10-24 DIAGNOSIS — F411 Generalized anxiety disorder: Secondary | ICD-10-CM | POA: Diagnosis not present

## 2021-10-24 DIAGNOSIS — Z8669 Personal history of other diseases of the nervous system and sense organs: Secondary | ICD-10-CM | POA: Diagnosis not present

## 2021-10-24 DIAGNOSIS — R4586 Emotional lability: Secondary | ICD-10-CM | POA: Diagnosis not present

## 2021-10-24 LAB — DRUG TOX MONITOR 1 W/CONF, ORAL FLD
Amphetamines: NEGATIVE ng/mL (ref <10)
Barbiturates: NEGATIVE ng/mL (ref <10)
Benzodiazepines: NEGATIVE ng/mL (ref <0.50)
Buprenorphine: NEGATIVE ng/mL (ref <0.10)
Cocaine: NEGATIVE ng/mL (ref <5.0)
Codeine: NEGATIVE ng/mL (ref <2.5)
Dihydrocodeine: 2.5 ng/mL — ABNORMAL HIGH (ref <2.5)
Fentanyl: NEGATIVE ng/mL (ref <0.10)
Heroin Metabolite: NEGATIVE ng/mL (ref <1.0)
Hydrocodone: 14.1 ng/mL — ABNORMAL HIGH (ref <2.5)
Hydromorphone: NEGATIVE ng/mL (ref <2.5)
MARIJUANA: NEGATIVE ng/mL (ref <2.5)
MDMA: NEGATIVE ng/mL (ref <10)
Meprobamate: NEGATIVE ng/mL (ref <2.5)
Methadone: NEGATIVE ng/mL (ref <5.0)
Morphine: NEGATIVE ng/mL (ref <2.5)
Nicotine Metabolite: NEGATIVE ng/mL (ref <5.0)
Norhydrocodone: NEGATIVE ng/mL (ref <2.5)
Noroxycodone: NEGATIVE ng/mL (ref <2.5)
Opiates: POSITIVE ng/mL — AB (ref <2.5)
Oxycodone: NEGATIVE ng/mL (ref <2.5)
Oxymorphone: NEGATIVE ng/mL (ref <2.5)
Phencyclidine: NEGATIVE ng/mL (ref <10)
Tapentadol: NEGATIVE ng/mL (ref <5.0)
Tramadol: NEGATIVE ng/mL (ref <5.0)
Zolpidem: NEGATIVE ng/mL (ref <5.0)

## 2021-10-24 LAB — DRUG TOX ALC METAB W/CON, ORAL FLD: Alcohol Metabolite: NEGATIVE ng/mL (ref <25)

## 2021-10-26 ENCOUNTER — Telehealth: Payer: Self-pay | Admitting: *Deleted

## 2021-10-26 DIAGNOSIS — R109 Unspecified abdominal pain: Secondary | ICD-10-CM | POA: Diagnosis not present

## 2021-10-26 DIAGNOSIS — N3010 Interstitial cystitis (chronic) without hematuria: Secondary | ICD-10-CM | POA: Diagnosis not present

## 2021-10-26 NOTE — Telephone Encounter (Signed)
Oral swab drug screen was consistent for prescribed medications.  ?

## 2021-10-30 ENCOUNTER — Encounter: Payer: Self-pay | Admitting: Physician Assistant

## 2021-11-01 ENCOUNTER — Other Ambulatory Visit: Payer: Self-pay

## 2021-11-01 ENCOUNTER — Ambulatory Visit (INDEPENDENT_AMBULATORY_CARE_PROVIDER_SITE_OTHER): Payer: Medicare Other

## 2021-11-01 ENCOUNTER — Ambulatory Visit: Payer: Medicare Other

## 2021-11-01 ENCOUNTER — Encounter: Payer: Self-pay | Admitting: Surgery

## 2021-11-01 ENCOUNTER — Telehealth: Payer: Self-pay

## 2021-11-01 ENCOUNTER — Ambulatory Visit (INDEPENDENT_AMBULATORY_CARE_PROVIDER_SITE_OTHER): Payer: Medicare Other | Admitting: Surgery

## 2021-11-01 VITALS — BP 142/94 | HR 76 | Ht 63.0 in | Wt 229.0 lb

## 2021-11-01 DIAGNOSIS — M25562 Pain in left knee: Secondary | ICD-10-CM

## 2021-11-01 DIAGNOSIS — J301 Allergic rhinitis due to pollen: Secondary | ICD-10-CM | POA: Diagnosis not present

## 2021-11-01 MED ORDER — BUPIVACAINE HCL 0.25 % IJ SOLN
6.0000 mL | INTRAMUSCULAR | Status: AC | PRN
  Administered 2021-11-01: 6 mL via INTRA_ARTICULAR

## 2021-11-01 MED ORDER — LIDOCAINE HCL 1 % IJ SOLN
3.0000 mL | INTRAMUSCULAR | Status: AC | PRN
  Administered 2021-11-01: 3 mL

## 2021-11-01 MED ORDER — METHYLPREDNISOLONE ACETATE 40 MG/ML IJ SUSP
40.0000 mg | INTRAMUSCULAR | Status: AC | PRN
  Administered 2021-11-01: 40 mg via INTRA_ARTICULAR

## 2021-11-01 NOTE — Telephone Encounter (Signed)
Place allergy chart on Tat's desk to check benefits.  Pt is due for a refill of allergy serum.  2 vials

## 2021-11-01 NOTE — Progress Notes (Signed)
Office Visit Note   Patient: Karina Greer           Date of Birth: Apr 21, 1966           MRN: [id number] Visit Date: 11/01/2021              Requested by: Cyndi Bender, PA-C [address] [place],   [id number] PCP: Cyndi Bender, PA-C   Assessment & Plan: Visit Diagnoses:  1. Acute pain of left knee   2. Mechanical knee pain, left     Plan: In hopes of given patient some improvement of her knee pain offered injection.  After patient consent left knee was prepped with Betadine and intra-articular Marcaine/Depo-Medrol injection was performed.  I will have patient follow-up with Dr. Lorin Mercy in 3 weeks for recheck.  If she continues to be symptomatic he may want to get an MRI.  Follow-Up Instructions: Return in about 3 weeks (around 11/22/2021) for WITH DR YATES RECHECK LEFT KNEE.   Orders:  Orders Placed This Encounter  Procedures   Large Joint Inj   XR KNEE 3 VIEW LEFT   No orders of the defined types were placed in this encounter.     Procedures: Large Joint Inj: L knee on 11/01/2021 10:47 AM Details: 25 G 1.5 in needle, anteromedial approach Medications: 3 mL lidocaine 1 %; 40 mg methylPREDNISolone acetate 40 MG/ML; 6 mL bupivacaine 0.25 % Consent was given by the patient. Patient was prepped and draped in the usual sterile fashion.      Clinical Data: No additional findings.   Subjective: Chief Complaint  Patient presents with   Left Knee - Pain    HPI 56 year old white female comes in today with complaints of left knee and lower leg pain.  States that left knee pain has been going on x2 months.  No injury that she can recall.  She has had some pain and swelling down into the left calf.  States that she has had a venous Doppler left lower extremity which was negative for DVT.  She has also had lumbar MRI September 04, 2021 ordered by another provider and that report showed:  CLINICAL DATA:  Spinal stenosis; lumbosacral. Low back, left leg and bilateral  hip pain with left greater than right for 1 year.   EXAM: MRI LUMBAR SPINE WITHOUT CONTRAST   TECHNIQUE: Multiplanar, multisequence MR imaging of the lumbar spine was performed. No intravenous contrast was administered.   COMPARISON:  MR lumbar 11/12/2011.   FINDINGS: Segmentation:  Standard.   Alignment:  Physiologic.   Vertebrae: No acute fracture, evidence of discitis, or aggressive bone lesion.   Conus medullaris and cauda equina: Conus extends to the L1-2 level. Conus and cauda equina appear normal.   Paraspinal and other soft tissues: No acute paraspinal abnormality.   Disc levels:   Disc spaces: Disc desiccation at L3-4, L4-5 and L5-S1.   T12-L1: No significant disc bulge. No neural foraminal stenosis. No central canal stenosis.   L1-L2: Minimal broad-based disc bulge. No foraminal or central canal stenosis.   L2-L3: No significant disc protrusion. No foraminal or central canal stenosis.   L3-L4: No significant disc bulge. No neural foraminal stenosis. No central canal stenosis.   L4-L5: No significant disc bulge. No neural foraminal stenosis. No central canal stenosis.   L5-S1: Mild broad-based disc bulge. Mild bilateral facet arthropathy. Mild right foraminal stenosis. No left foraminal stenosis.   IMPRESSION: 1. Mild lumbar spine spondylosis as described above without significant interval  change compared with 11/12/2011. 2.  No acute osseous injury of the lumbar spine.     Electronically Signed   By: Kathreen Devoid M.D.   On: 09/04/2021 11:45   Patient has knee pain with weightbearing, pivoting, stairs.  States that she goes to a pain clinic. Review of Systems No current cardiac pulmonary GI GU issues  Objective: Vital Signs: BP (!) 142/94    Pulse 76    Ht 5\' 3"  (1.6 m)    Wt 229 lb (103.9 kg)    BMI 40.57 kg/m   Physical Exam HENT:     Head: Normocephalic and atraumatic.     Nose: Nose normal.  Eyes:     Extraocular Movements:  Extraocular movements intact.  Pulmonary:     Effort: No respiratory distress.  Musculoskeletal:     Comments: Gait is somewhat antalgic.  Exam left knee she has some posterior knee swelling and may be a small Baker's cyst.  Medial joint line exquisitely tender.  Positive McMurray's test.  Cruciate and collateral ligaments are stable.  Negative patellar apprehension.  Positive patellofemoral crepitus.  Neurological:     Mental Status: She is alert and oriented to person, place, and time.    Ortho Exam  Specialty Comments:  No specialty comments available.  Imaging: No results found.   PMFS History: Patient Active Problem List   Diagnosis Date Noted   Family history of blood clots 10/09/2021   Lateral epicondylitis, right elbow 05/25/2019   Atherosclerosis of aorta (HCC) 10/28/2018   Elevated glucose 10/28/2018   Mixed hyperlipidemia 10/28/2018   Hypothyroidism 07/20/2018   LLQ pain 12/03/2017   Pre-diabetes 06/03/2017   Rectal bleeding 06/02/2017   S/P cervical spinal fusion 03/11/2016   Bilateral hand pain 11/29/2015   Elevated C-reactive protein 11/29/2015   Elevated rheumatoid factor 11/29/2015   Raynaud's phenomenon without gangrene 11/29/2015   Bloating 09/12/2014   Dizziness 08/08/2014   Peri-menopause 08/08/2014   Chronic tension-type headache, intractable 07/13/2014   Gastroesophageal reflux disease without esophagitis 07/12/2014   Anxiety 04/18/2014   Chronic arthritis 04/18/2014   Cystitis 04/18/2014   Morbid obesity ([place]) 04/18/2014   Fibromyalgia 04/18/2014   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[place]) 04/18/2014   Osteoporosis, 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   Arthropathy 04/18/2014   Cephalalgia 04/04/2014   Numbness and tingling 04/04/2014   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 02/23/2013   Nonspecific abnormal findings on radiological and other examination of skull and head 02/23/2013   Other nonspecific abnormal result of function study of brain and  central nervous system 07/01/2012   Disturbance of skin sensation 07/01/2012   Migraine without aura 07/01/2012   Abdominal pain 02/11/2012   Chronic fatigue syndrome 02/11/2012   Chronic interstitial cystitis 02/11/2012   Dyspareunia 02/11/2012   Dysuria 02/11/2012   Female stress incontinence 02/11/2012   Irritable bowel syndrome with diarrhea 02/11/2012   Nausea and vomiting 02/11/2012   Nocturia 02/11/2012   Urge incontinence 02/11/2012   Urinary urgency 02/11/2012   Lumbar spondylosis 02/04/2012   Myalgia and myositis 02/04/2012   Depression 02/04/2012   Lumbosacral spondylosis without myelopathy 02/04/2012   OBESITY, UNSPECIFIED 10/03/2010   GENERALIZED ANXIETY DISORDER 10/03/2010   Chest pain 10/03/2010   Past Medical History:  Diagnosis Date   Anxiety    Anxiety disorder    Arthritis    Blepharitis    Chronic low back pain    Chronic pain    Depression    Diabetes mellitus without complication ([place])  Dyslipidemia    Family history of blood clots    Fibromyalgia    GERD (gastroesophageal reflux disease)    Headache    hx migraines   History of kidney stones    History of panic attacks    History of renal calculi    IBS (irritable bowel syndrome)    Interstitial cystitis    Lupus ([place])    tested positive for antibodies for lupus. 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    Family History  Problem Relation Age of Onset   Hypertension Mother    Diabetes Father    Brain cancer Father    Clotting disorder Father        blood clots   Fibromyalgia Sister    Suicidality Sister    Hypertension Other         Cancer, Cerebrovascular disease run on mother side of family    Past Surgical History:  Procedure Laterality Date   ACHILLES TENDON SURGERY Right 3/16   ANTERIOR CERVICAL DECOMP/DISCECTOMY FUSION N/A 03/11/2016   Procedure: C5-6, C6-7 Anterior Cervical Discectomy and Fusion, Allograft, Plate;  Surgeon: Marybelle Killings, MD;  Location: [place];  Service: Orthopedics;  Laterality: N/A;   CHOLECYSTECTOMY  2014   COLONOSCOPY WITH PROPOFOL N/A 08/25/2017   Procedure: COLONOSCOPY WITH PROPOFOL;  Surgeon: Manya Silvas, MD;  [facility] ENDOSCOPY;  Service: Endoscopy;  Laterality: N/A;   CYSTO WITH HYDRODISTENSION N/A 01/26/2014   Procedure: CYSTOSCOPY/HYDRODISTENSION;  Surgeon: Bernestine Amass, MD;  Location: [facility];  Service: Urology;  Laterality: N/A;   CYSTO/  URETHRAL DILATION/  HYDRODISTENTION/   INSTILLATION THERAPY  07-16-2010//   12-30-2007//   10-27-2006   ELBOW SURGERY Right    EXERCISE TOLERENCE TEST  10-12-2010   NEGATIVE  ADEQUATE ETT/  NO ISCHEMIA OR EVIDENCE HIGH GRADE OBSTRUCTIVE CAD/  NO FURTHER TEST NEEDED   HYSTEROSCOPY W/  [place] ENDOMETRIAL ABLATION  2014   LAPAROSCOPIC OVARIAN CYST BX  2005   AND  URETEROSCOPIC LASER LITHO  STONE EXTRACTION   SHOULDER OPEN ROTATOR CUFF REPAIR Right 2004   TONSILLECTOMY     TRANSTHORACIC ECHOCARDIOGRAM  06-07-2006   normal study/  ef 60-65%   TUBAL LIGATION Bilateral 1995   Social History   Occupational History   Occupation: Merchandiser, retail: UNEMPLOYED    Comment: Disability  Tobacco Use   Smoking status: Never   Smokeless tobacco: Never  Vaping Use   Vaping Use: Never used  Substance and Sexual Activity   Alcohol use: No   Drug use: No   Sexual activity: Not on file

## 2021-11-05 ENCOUNTER — Telehealth: Payer: Self-pay

## 2021-11-05 NOTE — Telephone Encounter (Signed)
Benefits has been checked for 2 vials of allergy serum.   Medicare/Medicaid active/deductible met/ 20% coins/ Medicaid will cover the 20%. No patient resp. tat

## 2021-11-07 ENCOUNTER — Telehealth: Payer: Self-pay

## 2021-11-09 ENCOUNTER — Ambulatory Visit (INDEPENDENT_AMBULATORY_CARE_PROVIDER_SITE_OTHER): Payer: Medicare Other

## 2021-11-09 DIAGNOSIS — J301 Allergic rhinitis due to pollen: Secondary | ICD-10-CM | POA: Diagnosis not present

## 2021-11-09 NOTE — Progress Notes (Addendum)
Aeroallergen Immunotherapy    Patient Details  Name: Karina Greer MRN: [id number] Date of Birth: Jan 18, 1966  Order 1 of 1  Vial Label: TGW  0.20 mL of each antigen: Acacia, Cottonwood, Dust Mite Mix, English Plantain, Lambs Quarter, [place] Box Elder, [employer], Pigweed Mix, Privet, Ragweed Mix, Intel Burch, and White/Red Mulberry   2.4   mL Extract Subtotal 2.6   mL normal saline Diluent  5.0  mL Maintenance Total    Order 2 of 1  Vial Label: CRMD  0.20 mL of each antigen: Aspergillus Fumigatus, Cladosporium Herbarum, Cladosporium Sphaerospermum, Cockroach Mix, Dog Mixed breeds, and Penicillin Chrysogenum  1.2   mL Extract Subtotal 3.8   mL Normal Saline Diluent 5.0  mL Maintenance Total

## 2021-11-09 NOTE — Telephone Encounter (Signed)
Billing for allergy mixing done

## 2021-11-09 NOTE — Progress Notes (Deleted)
°  Aeroallergen Immunotherapy    Patient Details  Name: Karina Greer MRN: [id number] Date of Birth: March 04, 1966  Order 1 of 1  Vial Label: TGW  0.20 mL of each antigen: Acacia, Cottonwood, Dust [employer], English Plantain, Lambs Quarter, [place] Box Elder, [employer], Pigweed Mix, Privet, Ragweed Mix, Intel Burch, and White/Red Mulberry   5.0   mL Extract Subtotal 2.6   mL {CHL AMB [facility] DILUENT LIST:[phone]} 5.0  mL Maintenance Tot

## 2021-11-12 ENCOUNTER — Other Ambulatory Visit: Payer: Self-pay

## 2021-11-12 ENCOUNTER — Ambulatory Visit (INDEPENDENT_AMBULATORY_CARE_PROVIDER_SITE_OTHER): Payer: Medicare Other

## 2021-11-12 DIAGNOSIS — J301 Allergic rhinitis due to pollen: Secondary | ICD-10-CM | POA: Diagnosis not present

## 2021-11-15 ENCOUNTER — Ambulatory Visit: Payer: Medicare Other

## 2021-11-15 DIAGNOSIS — R6889 Other general symptoms and signs: Secondary | ICD-10-CM | POA: Diagnosis not present

## 2021-11-15 DIAGNOSIS — Z6839 Body mass index (BMI) 39.0-39.9, adult: Secondary | ICD-10-CM | POA: Diagnosis not present

## 2021-11-15 DIAGNOSIS — H6690 Otitis media, unspecified, unspecified ear: Secondary | ICD-10-CM | POA: Diagnosis not present

## 2021-11-16 ENCOUNTER — Other Ambulatory Visit (HOSPITAL_COMMUNITY): Payer: Self-pay

## 2021-11-19 ENCOUNTER — Other Ambulatory Visit (HOSPITAL_COMMUNITY): Payer: Self-pay

## 2021-11-19 ENCOUNTER — Other Ambulatory Visit: Payer: Self-pay

## 2021-11-19 ENCOUNTER — Encounter: Payer: Self-pay | Admitting: Registered Nurse

## 2021-11-19 ENCOUNTER — Encounter: Payer: Medicare Other | Attending: Physical Medicine & Rehabilitation | Admitting: Registered Nurse

## 2021-11-19 VITALS — BP 136/88 | HR 66 | Ht 63.0 in | Wt 228.4 lb

## 2021-11-19 DIAGNOSIS — M25562 Pain in left knee: Secondary | ICD-10-CM | POA: Diagnosis not present

## 2021-11-19 DIAGNOSIS — M5416 Radiculopathy, lumbar region: Secondary | ICD-10-CM | POA: Insufficient documentation

## 2021-11-19 DIAGNOSIS — Z5181 Encounter for therapeutic drug level monitoring: Secondary | ICD-10-CM | POA: Insufficient documentation

## 2021-11-19 DIAGNOSIS — Z79891 Long term (current) use of opiate analgesic: Secondary | ICD-10-CM | POA: Insufficient documentation

## 2021-11-19 DIAGNOSIS — M7062 Trochanteric bursitis, left hip: Secondary | ICD-10-CM | POA: Insufficient documentation

## 2021-11-19 DIAGNOSIS — R202 Paresthesia of skin: Secondary | ICD-10-CM | POA: Diagnosis not present

## 2021-11-19 DIAGNOSIS — M797 Fibromyalgia: Secondary | ICD-10-CM | POA: Insufficient documentation

## 2021-11-19 DIAGNOSIS — M2550 Pain in unspecified joint: Secondary | ICD-10-CM | POA: Diagnosis not present

## 2021-11-19 DIAGNOSIS — G894 Chronic pain syndrome: Secondary | ICD-10-CM | POA: Diagnosis not present

## 2021-11-19 DIAGNOSIS — G8929 Other chronic pain: Secondary | ICD-10-CM | POA: Diagnosis not present

## 2021-11-19 DIAGNOSIS — M542 Cervicalgia: Secondary | ICD-10-CM | POA: Insufficient documentation

## 2021-11-19 DIAGNOSIS — M79609 Pain in unspecified limb: Secondary | ICD-10-CM | POA: Diagnosis not present

## 2021-11-19 MED ORDER — HYDROCODONE-ACETAMINOPHEN 10-325 MG PO TABS
1.0000 | ORAL_TABLET | Freq: Three times a day (TID) | ORAL | 0 refills | Status: DC | PRN
  Filled 2021-11-19: qty 90, 30d supply, fill #0

## 2021-11-19 NOTE — Progress Notes (Signed)
Subjective:    Patient ID: Karina Greer, female    DOB: 01/07/1966, 56 y.o.   MRN: [id number]  HPI: Karina Greer is a 56 y.o. female who returns for follow up appointment for chronic pain and medication refill. She states her  pain is located in her neck, lower back pain radiating into her left hip, left knee pain and occasionally pain in her right lower extremity, she describes pain as prickling sensation . She rates her pain 7. Her current exercise regime is walking and performing stretching exercises.  [name] Morphine equivalent is 30.00  MME. She  is also prescribed Clonazepam by Dr. Melrose Nakayama, she states she is using the clonazepam sporadically   .We have discussed the black box warning of using opioids and benzodiazepines. I highlighted the dangers of using these drugs together and discussed the adverse events including respiratory suppression, overdose, cognitive impairment and importance of compliance with current regimen. We will continue to monitor and adjust as indicated.   Last Oral Swab was Performed on 10/19/2021, it was consistent.     Pain Inventory Average Pain 9 Pain Right Now 7 My pain is constant, tingling, and aching  In the last 24 hours, has pain interfered with the following? General activity 6 Relation with others 4 Enjoyment of life 5 What TIME of day is your pain at its worst? morning , daytime, evening, and night Sleep (in general) Fair  Pain is worse with: walking, bending, sitting, inactivity, standing, and some activites Pain improves with: rest, heat/ice, therapy/exercise, pacing activities, medication, TENS, and injections Relief from Meds: 5  Family History  Problem Relation Age of Onset   Hypertension Mother    Diabetes Father    Brain cancer Father    Clotting disorder Father        blood clots   Fibromyalgia Sister    Suicidality Sister    Hypertension Other        Cancer, Cerebrovascular disease run on mother side of family   Social  History   Socioeconomic History   Marital status: Divorced    Spouse name: Not on file   Number of children: 3   Years of education: HS   Highest education level: 12th grade  Occupational History   Occupation: Merchandiser, retail: UNEMPLOYED    Comment: Disability  Tobacco Use   Smoking status: Never   Smokeless tobacco: Never  Vaping Use   Vaping Use: Never used  Substance and Sexual Activity   Alcohol use: No   Drug use: No   Sexual activity: Not on file  Other Topics Concern   Not on file  Social History Narrative   Patient is right-handed. She avoids caffeine. She has recently been using the treadmill.   Social Determinants of Health   Financial Resource Strain: Not on file  Food Insecurity: Not on file  Transportation Needs: Not on file  Physical Activity: Not on file  Stress: Not on file  Social Connections: Not on file   Past Surgical History:  Procedure Laterality Date   ACHILLES TENDON SURGERY Right 3/16   ANTERIOR CERVICAL DECOMP/DISCECTOMY FUSION N/A 03/11/2016   Procedure: C5-6, C6-7 Anterior Cervical Discectomy and Fusion, Allograft, Plate;  Surgeon: Marybelle Killings, MD;  Location: [place];  Service: Orthopedics;  Laterality: N/A;   CHOLECYSTECTOMY  2014   COLONOSCOPY WITH PROPOFOL N/A 08/25/2017   Procedure: COLONOSCOPY WITH PROPOFOL;  Surgeon: Manya Silvas, MD;  Location: [facility] ENDOSCOPY;  Service: Endoscopy;  Laterality: N/A;   CYSTO WITH HYDRODISTENSION N/A 01/26/2014   Procedure: CYSTOSCOPY/HYDRODISTENSION;  Surgeon: Bernestine Amass, MD;  Location: [facility];  Service: Urology;  Laterality: N/A;   CYSTO/  URETHRAL DILATION/  HYDRODISTENTION/   INSTILLATION THERAPY  07-16-2010//   12-30-2007//   10-27-2006   ELBOW SURGERY Right    EXERCISE TOLERENCE TEST  10-12-2010   NEGATIVE  ADEQUATE ETT/  NO ISCHEMIA OR EVIDENCE HIGH GRADE OBSTRUCTIVE CAD/  NO FURTHER TEST NEEDED   HYSTEROSCOPY W/  NOVASURE ENDOMETRIAL ABLATION  2014   LAPAROSCOPIC  OVARIAN CYST BX  2005   AND  URETEROSCOPIC LASER LITHO  STONE EXTRACTION   SHOULDER OPEN ROTATOR CUFF REPAIR Right 2004   TONSILLECTOMY     TRANSTHORACIC ECHOCARDIOGRAM  06-07-2006   normal study/  ef 60-65%   TUBAL LIGATION Bilateral 1995   Past Surgical History:  Procedure Laterality Date   ACHILLES TENDON SURGERY Right 3/16   ANTERIOR CERVICAL DECOMP/DISCECTOMY FUSION N/A 03/11/2016   Procedure: C5-6, C6-7 Anterior Cervical Discectomy and Fusion, Allograft, Plate;  Surgeon: Marybelle Killings, MD;  Location: [place];  Service: Orthopedics;  Laterality: N/A;   CHOLECYSTECTOMY  2014   COLONOSCOPY WITH PROPOFOL N/A 08/25/2017   Procedure: COLONOSCOPY WITH PROPOFOL;  Surgeon: Manya Silvas, MD;  [facility] ENDOSCOPY;  Service: Endoscopy;  Laterality: N/A;   CYSTO WITH HYDRODISTENSION N/A 01/26/2014   Procedure: CYSTOSCOPY/HYDRODISTENSION;  Surgeon: Bernestine Amass, MD;  Location: [facility];  Service: Urology;  Laterality: N/A;   CYSTO/  URETHRAL DILATION/  HYDRODISTENTION/   INSTILLATION THERAPY  07-16-2010//   12-30-2007//   10-27-2006   ELBOW SURGERY Right    EXERCISE TOLERENCE TEST  10-12-2010   NEGATIVE  ADEQUATE ETT/  NO ISCHEMIA OR EVIDENCE HIGH GRADE OBSTRUCTIVE CAD/  NO FURTHER TEST NEEDED   HYSTEROSCOPY W/  [place] ENDOMETRIAL ABLATION  2014   LAPAROSCOPIC OVARIAN CYST BX  2005   AND  URETEROSCOPIC LASER LITHO  STONE EXTRACTION   SHOULDER OPEN ROTATOR CUFF REPAIR Right 2004   TONSILLECTOMY     TRANSTHORACIC ECHOCARDIOGRAM  06-07-2006   normal study/  ef 60-65%   TUBAL LIGATION Bilateral 1995   Past Medical History:  Diagnosis Date   Anxiety    Anxiety disorder    Arthritis    Blepharitis    Chronic low back pain    Chronic pain    Depression    Diabetes mellitus without complication (HCC)    Dyslipidemia    Family history of blood clots    Fibromyalgia    GERD (gastroesophageal reflux disease)    Headache    hx migraines   History of kidney stones     History of panic attacks    History of renal calculi    IBS (irritable bowel syndrome)    Interstitial cystitis    Lupus ([place])    tested positive for antibodies for lupus. 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   BP 136/88    Pulse  66    Ht 5\' 3"  (1.6 m)    Wt 228 lb 6.4 oz (103.6 kg)    SpO2 97%    BMI 40.46 kg/m   Opioid Risk Score:   Fall Risk Score:  `1  Depression screen PHQ 2/9  Depression screen [facility] 2/9 11/19/2021 10/19/2021 08/23/2021 07/24/2021 05/25/2021 04/30/2021 12/04/2020  Decreased Interest 0 0 0 0 0 0 0  Down, Depressed, Hopeless 0 0 0 0 0 0 0  PHQ - 2 Score 0 0 0 0 0 0 0  Altered sleeping - - - - - - -  Tired, decreased energy - - - - - - -  Change in appetite - - - - - - -  Feeling bad or failure about yourself  - - - - - - -  Trouble concentrating - - - - - - -  Moving slowly or fidgety/restless - - - - - - -  Suicidal thoughts - - - - - - -  PHQ-9 Score - - - - - - -  Some recent data might be hidden     Review of Systems  Constitutional: Negative.   HENT: Negative.    Eyes: Negative.   Respiratory: Negative.    Cardiovascular: Negative.   Gastrointestinal: Negative.   Endocrine: Negative.   Genitourinary: Negative.   Musculoskeletal:  Positive for back pain and neck pain.  Skin: Negative.   Allergic/Immunologic: Negative.   Neurological: Negative.   Hematological: Negative.   Psychiatric/Behavioral: Negative.        Objective:   Physical Exam Vitals and nursing note reviewed.  Constitutional:      Appearance: Normal appearance. She is obese.  Cardiovascular:     Rate and Rhythm: Normal rate  and regular rhythm.     Pulses: Normal pulses.     Heart sounds: Normal heart sounds.  Pulmonary:     Effort: Pulmonary effort is normal.     Breath sounds: Normal breath sounds.  Musculoskeletal:     Cervical back: Normal range of motion and neck supple.     Comments: Normal Muscle Bulk and Muscle Testing Reveals:  Upper Extremities: Full ROM and Muscle Strength 5/5 Bilateral AC Joint Tenderness Lumbar Paraspinal Tenderness: L-4-L-5 Mainly Right Side  Left Greater Trochanter Tenderness Lower Extremities: Full ROM and Muscle Strength 5/5 Left Lower Extremity Flexion Produces Pain into her Left hip and left groin Arises from chair with ease Narrow Based  Gait     Skin:    General: Skin is warm and dry.  Neurological:     Mental Status: She is alert and oriented to person, place, and time.  Psychiatric:        Mood and Affect: Mood normal.        Behavior: Behavior normal.         Assessment & Plan:  1. Lumbago/ Lumbar Spondylosis/ Left Lumbar Radiculitis: Continue to Monitor. 02/27203. Refilled: HYDROcodone 10/325mg  one tablet every 8 hours as needed #90.  We will continue the opioid monitoring program, this consists of regular clinic visits, examinations, urine drug screen, pill counts as well as use of [place] Controlled Substance Reporting system. A 12 month History has been reviewed on the [place] on 11/19/2021. 2. Fibromyalgia. Continue Current exercise Regime. 11/19/2021 3. Anxiety and depression:Psychiatry Following: Counselor Jessica  at the [facility] . Continue Counseling at [facility]. 11/19/2021 4. Migraines: On [place]. Neurology Following. 11/19/2021 5. OSA : PCP Following. Continue to  Monitor. 11/19/2021 6. Obesity: Dr Ranell Patrick Following: Continue  Healthy Diet Regimen and Continue HEP as Tolerated. 11/19/2021 7. Status Post Cervical Spinal Fusion: C5C6- C-6- C-7 Anterior Cervical Discectomy Fusion and  Allograft Plate: Dr. Lorin Mercy Following. 11/19/2021 8. Cervicalgia/ Cervical Radiculitis/ S/P Cervical Spinal Fusion:  Continue to Monitor Dr. Lorin Mercy Following. 11/19/2021 9. Muscle Spasm: Continue Tizanidine as needed.11/19/2021 10.Hereditary and Idiopathic Peripheral Neuropathy Continue with Tens Unit. 11/19/2020. 11.Left Greater Trochanteric Bursitis: . Continue to Alternate Ice and heat Therapy. Continue to Monitor. 11/19/2021. 12. Chronic Bilateral Knee Pain: No Complaints Today. Ortho Following. Continue HEP as Tolerated. Continue to Monitor. 11/19/2021 13. Lateral Epicondylitis of Right Elbow: No complaints today.  Ortho Following. S/P   06/26/2020 Right lateral epicondyle debridement, drilling and repair  By Dr Lorin Mercy. 14. Neuralgia/ Right Facial Droop: Neurology following. Continue to monitor. 11/19/2021. 15. Polyarthralgia: No complaints today. Continue HEP as tolerated. Continue to monitor. 11/19/2021  16. Paresthesia of Right Lower Extremity: Occasionally: Continue current medication regimen. Continue to monitor.   F/U in 1 month .

## 2021-11-23 ENCOUNTER — Ambulatory Visit: Payer: Medicare Other | Admitting: Orthopaedic Surgery

## 2021-11-26 DIAGNOSIS — G4733 Obstructive sleep apnea (adult) (pediatric): Secondary | ICD-10-CM | POA: Diagnosis not present

## 2021-11-26 DIAGNOSIS — R5383 Other fatigue: Secondary | ICD-10-CM | POA: Diagnosis not present

## 2021-11-26 DIAGNOSIS — J45909 Unspecified asthma, uncomplicated: Secondary | ICD-10-CM | POA: Diagnosis not present

## 2021-11-26 DIAGNOSIS — E669 Obesity, unspecified: Secondary | ICD-10-CM | POA: Diagnosis not present

## 2021-11-26 DIAGNOSIS — M810 Age-related osteoporosis without current pathological fracture: Secondary | ICD-10-CM | POA: Diagnosis not present

## 2021-11-29 ENCOUNTER — Ambulatory Visit (INDEPENDENT_AMBULATORY_CARE_PROVIDER_SITE_OTHER): Payer: Medicare Other

## 2021-11-29 ENCOUNTER — Other Ambulatory Visit: Payer: Self-pay

## 2021-11-29 DIAGNOSIS — E669 Obesity, unspecified: Secondary | ICD-10-CM | POA: Diagnosis not present

## 2021-11-29 DIAGNOSIS — J301 Allergic rhinitis due to pollen: Secondary | ICD-10-CM

## 2021-11-29 DIAGNOSIS — Z1231 Encounter for screening mammogram for malignant neoplasm of breast: Secondary | ICD-10-CM | POA: Diagnosis not present

## 2021-11-29 DIAGNOSIS — M810 Age-related osteoporosis without current pathological fracture: Secondary | ICD-10-CM | POA: Diagnosis not present

## 2021-12-04 ENCOUNTER — Encounter: Payer: Medicare Other | Attending: Physical Medicine & Rehabilitation | Admitting: Physical Medicine and Rehabilitation

## 2021-12-04 ENCOUNTER — Encounter: Payer: Self-pay | Admitting: Physical Medicine and Rehabilitation

## 2021-12-04 ENCOUNTER — Other Ambulatory Visit: Payer: Self-pay

## 2021-12-04 VITALS — BP 123/81 | HR 77 | Ht 63.0 in | Wt 231.0 lb

## 2021-12-04 DIAGNOSIS — G8929 Other chronic pain: Secondary | ICD-10-CM | POA: Insufficient documentation

## 2021-12-04 DIAGNOSIS — M810 Age-related osteoporosis without current pathological fracture: Secondary | ICD-10-CM | POA: Diagnosis not present

## 2021-12-04 DIAGNOSIS — Z6841 Body Mass Index (BMI) 40.0 and over, adult: Secondary | ICD-10-CM | POA: Diagnosis not present

## 2021-12-04 DIAGNOSIS — M797 Fibromyalgia: Secondary | ICD-10-CM | POA: Insufficient documentation

## 2021-12-04 DIAGNOSIS — M546 Pain in thoracic spine: Secondary | ICD-10-CM | POA: Diagnosis not present

## 2021-12-04 DIAGNOSIS — M545 Low back pain, unspecified: Secondary | ICD-10-CM | POA: Insufficient documentation

## 2021-12-04 DIAGNOSIS — G894 Chronic pain syndrome: Secondary | ICD-10-CM | POA: Diagnosis not present

## 2021-12-04 DIAGNOSIS — M7062 Trochanteric bursitis, left hip: Secondary | ICD-10-CM | POA: Diagnosis not present

## 2021-12-04 DIAGNOSIS — Z79891 Long term (current) use of opiate analgesic: Secondary | ICD-10-CM | POA: Insufficient documentation

## 2021-12-04 DIAGNOSIS — Z5181 Encounter for therapeutic drug level monitoring: Secondary | ICD-10-CM | POA: Insufficient documentation

## 2021-12-04 NOTE — Progress Notes (Signed)
? ?Subjective:  ? ? Patient ID: Karina Greer, female    DOB: 09-25-1965, 56 y.o.   MRN: [id number] ? ?HPI:  ?Karina Greer is a 56 y.o. female who presents with fibromyalgia who presents for nutritional counseling regarding her pain and medical conditions.  ? ?-She has trouble eating in the morning. Sometimes she does not eat until lunch. She has been trying to do the fasting we discussed last visit.  ?-Her son is 278 pounds and 6 foot tall. He went to 242 lbs following a similar diet.  ?-Her dietary goals are to make use of what she eats.  ?-she has been eating a lot of salads.  ?-she cannot handle hard cheeses.  ?-she has been having a problem eating hamburger.  ?-Last night she made four pieces of chicken and put on some tomato sauce and red peppers, she sprinkled some cheese, added broccoli, olive oil, garlic, butter.  ?-she is planned to have her HgbA1c and lipid panel drawn by her PCP next week.  ?-has been having low back pain, MRI ordered and shows stable spondylosis from prior ?-she feels better when there is no food in her stomach ?-she got organic oranges from a farm and is going to make her own orange juice ?-she bought the Bioptemizer's magnesium so will start this soon ?-she is having a lot of pain in her neck and feels this is from the weather and her surgery ?-her calf is hurting a lot. She has not had an ultrasound. ?-blood clots run in her family.   ?-she bought [employer], Engineering geologist, magnesium breakthrough, Krill oil, Spirulina, Coenzyme Q10 ?-she saw an endocrinologist and she did a swab test ?-she went today for bloodwork to check her potassium, cortisol to look for Cushing's Disease ?-she feels like her hormones are off balance.  ?-she feels that she has all the symptoms of Cushing's Disease ?-she was told her rheum panel is normal.  ?-she was told she has a meniscal tear by ortho ?  ? ?Pain Inventory ?Average Pain 8 ?Pain Right Now 6 ?My pain is constant, stabbing, tingling, and aching ? ?In  the last 24 hours, has pain interfered with the following? ?General activity 8 ?Relation with others 8 ?Enjoyment of life 8 ?What TIME of day is your pain at its worst? morning , daytime, evening, and night ?Sleep (in general) Fair ? ?Pain is worse with: walking, bending, sitting, inactivity, standing, unsure, and some activites ?Pain improves with: rest, heat/ice, therapy/exercise, pacing activities, medication, TENS, and injections ?Relief from Meds: 6 ? ?Family History  ?Problem Relation Age of Onset  ? Hypertension Mother   ? Diabetes Father   ? Brain cancer Father   ? Clotting disorder Father   ?     blood clots  ? Fibromyalgia Sister   ? Suicidality Sister   ? Hypertension Other   ?     Cancer, Cerebrovascular disease run on mother side of family  ? ?Social History  ? ?Socioeconomic History  ? Marital status: Divorced  ?  Spouse name: Not on file  ? Number of children: 3  ? Years of education: HS  ? Highest education level: 12th grade  ?Occupational History  ? Occupation: Unemployed  ?  Employer: UNEMPLOYED  ?  Comment: Disability  ?Tobacco Use  ? Smoking status: Never  ? Smokeless tobacco: Never  ?Vaping Use  ? Vaping Use: Never used  ?Substance and Sexual Activity  ? Alcohol use: No  ? Drug use:  No  ? Sexual activity: Not on file  ?Other Topics Concern  ? Not on file  ?Social History Narrative  ? Patient is right-handed. She avoids caffeine. She has recently been using the treadmill.  ? ?Social Determinants of Health  ? ?Financial Resource Strain: Not on file  ?Food Insecurity: Not on file  ?Transportation Needs: Not on file  ?Physical Activity: Not on file  ?Stress: Not on file  ?Social Connections: Not on file  ? ?Past Surgical History:  ?Procedure Laterality Date  ? ACHILLES TENDON SURGERY Right 3/16  ? ANTERIOR CERVICAL DECOMP/DISCECTOMY FUSION N/A 03/11/2016  ? Procedure: C5-6, C6-7 Anterior Cervical Discectomy and Fusion, Allograft, Plate;  Surgeon: Marybelle Killings, MD;  Location: [place];  Service:  Orthopedics;  Laterality: N/A;  ? CHOLECYSTECTOMY  2014  ? COLONOSCOPY WITH PROPOFOL N/A 08/25/2017  ? Procedure: COLONOSCOPY WITH PROPOFOL;  Surgeon: Manya Silvas, MD;  Location: [facility] ENDOSCOPY;  Service: Endoscopy;  Laterality: N/A;  ? CYSTO WITH HYDRODISTENSION N/A 01/26/2014  ? Procedure: CYSTOSCOPY/HYDRODISTENSION;  Surgeon: Bernestine Amass, MD;  Location: [facility];  Service: Urology;  Laterality: N/A;  ? CYSTO/  URETHRAL DILATION/  HYDRODISTENTION/   INSTILLATION THERAPY  07-16-2010//   12-30-2007//   10-27-2006  ? ELBOW SURGERY Right   ? EXERCISE TOLERENCE TEST  10-12-2010  ? NEGATIVE  ADEQUATE ETT/  NO ISCHEMIA OR EVIDENCE HIGH GRADE OBSTRUCTIVE CAD/  NO FURTHER TEST NEEDED  ? [place] ENDOMETRIAL ABLATION  2014  ? LAPAROSCOPIC OVARIAN CYST BX  2005  ? AND  URETEROSCOPIC LASER LITHO  STONE EXTRACTION  ? SHOULDER OPEN ROTATOR CUFF REPAIR Right 2004  ? TONSILLECTOMY    ? TRANSTHORACIC ECHOCARDIOGRAM  06-07-2006  ? normal study/  ef 60-65%  ? TUBAL LIGATION Bilateral 1995  ? ?Past Surgical History:  ?Procedure Laterality Date  ? ACHILLES TENDON SURGERY Right 3/16  ? ANTERIOR CERVICAL DECOMP/DISCECTOMY FUSION N/A 03/11/2016  ? Procedure: C5-6, C6-7 Anterior Cervical Discectomy and Fusion, Allograft, Plate;  Surgeon: Marybelle Killings, MD;  Location: [place];  Service: Orthopedics;  Laterality: N/A;  ? CHOLECYSTECTOMY  2014  ? COLONOSCOPY WITH PROPOFOL N/A 08/25/2017  ? Procedure: COLONOSCOPY WITH PROPOFOL;  Surgeon: Manya Silvas, MD;  Location: [facility] ENDOSCOPY;  Service: Endoscopy;  Laterality: N/A;  ? CYSTO WITH HYDRODISTENSION N/A 01/26/2014  ? Procedure: CYSTOSCOPY/HYDRODISTENSION;  Surgeon: Bernestine Amass, MD;  Location: [facility];  Service: Urology;  Laterality: N/A;  ? CYSTO/  URETHRAL DILATION/  HYDRODISTENTION/   INSTILLATION THERAPY  07-16-2010//   12-30-2007//   10-27-2006  ? ELBOW SURGERY Right   ? EXERCISE TOLERENCE TEST  10-12-2010  ? NEGATIVE  ADEQUATE  ETT/  NO ISCHEMIA OR EVIDENCE HIGH GRADE OBSTRUCTIVE CAD/  NO FURTHER TEST NEEDED  ? [place] ENDOMETRIAL ABLATION  2014  ? LAPAROSCOPIC OVARIAN CYST BX  2005  ? AND  URETEROSCOPIC LASER LITHO  STONE EXTRACTION  ? SHOULDER OPEN ROTATOR CUFF REPAIR Right 2004  ? TONSILLECTOMY    ? TRANSTHORACIC ECHOCARDIOGRAM  06-07-2006  ? normal study/  ef 60-65%  ? TUBAL LIGATION Bilateral 1995  ? ?Past Medical History:  ?Diagnosis Date  ? Anxiety   ? Anxiety disorder   ? Arthritis   ? Blepharitis   ? Chronic low back pain   ? Chronic pain   ? Depression   ? Diabetes mellitus without complication ([place])   ? Dyslipidemia   ? Family history of blood clots   ?  Fibromyalgia   ? GERD (gastroesophageal reflux disease)   ? Headache   ? hx migraines  ? History of kidney stones   ? History of panic attacks   ? History of renal calculi   ? IBS (irritable bowel syndrome)   ? Interstitial cystitis   ? Lupus ([place])   ? 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 dr to do further tests  ? Menorrhagia   ? OSA on CPAP   ? not used cpap for several weeks  ? Pneumonia   ? hx  ? PONV (postoperative nausea and vomiting)   ? RLS (restless legs syndrome)   ? Rosacea   ? Seasonal asthma   ? SI (sacroiliac) joint dysfunction   ? SUI (stress urinary incontinence, female)   ? UTI (lower urinary tract infection)   ? hx  ? 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 ?BP 123/81   Pulse 77   Ht '5\' 3"'$  (1.6 m)   Wt 231 lb (104.8 kg)   SpO2 94%   BMI 40.92 kg/m?  ? ?Opioid Risk Score:   ?Fall Risk Score:  `1 ? ?Depression screen PHQ 2/9 ? ?Depression screen [facility] 2/9 12/04/2021 11/19/2021 10/19/2021 08/23/2021 07/24/2021 05/25/2021 04/30/2021  ?Decreased Interest 1 0 0 0 0 0 0  ?Down, Depressed, Hopeless 1 0 0 0 0 0 0  ?PHQ - 2 Score 2 0 0 0 0 0 0  ?Altered sleeping - - - - - - -  ?Tired, decreased energy - - - - - - -  ?Change in appetite - - - - - - -  ?Feeling bad or failure about yourself  - - - - - - -  ?Trouble concentrating - - - - - - -   ?Moving slowly or fidgety/restless - - - - - - -  ?Suicidal thoughts - - - - - - -  ?PHQ-9 Score - - - - - - -  ?Some recent data might be hidden  ? ? ?Review of Systems  ?Musculoskeletal:  Positive for arthralgia

## 2021-12-07 DIAGNOSIS — M810 Age-related osteoporosis without current pathological fracture: Secondary | ICD-10-CM | POA: Diagnosis not present

## 2021-12-12 DIAGNOSIS — Z6839 Body mass index (BMI) 39.0-39.9, adult: Secondary | ICD-10-CM | POA: Diagnosis not present

## 2021-12-12 DIAGNOSIS — R768 Other specified abnormal immunological findings in serum: Secondary | ICD-10-CM | POA: Diagnosis not present

## 2021-12-12 DIAGNOSIS — M810 Age-related osteoporosis without current pathological fracture: Secondary | ICD-10-CM | POA: Diagnosis not present

## 2021-12-12 DIAGNOSIS — N390 Urinary tract infection, site not specified: Secondary | ICD-10-CM | POA: Diagnosis not present

## 2021-12-13 ENCOUNTER — Ambulatory Visit: Payer: Medicare Other

## 2021-12-14 ENCOUNTER — Encounter: Payer: Self-pay | Admitting: Registered Nurse

## 2021-12-14 ENCOUNTER — Ambulatory Visit (INDEPENDENT_AMBULATORY_CARE_PROVIDER_SITE_OTHER): Payer: Medicare Other | Admitting: Orthopaedic Surgery

## 2021-12-14 ENCOUNTER — Other Ambulatory Visit: Payer: Self-pay | Admitting: Registered Nurse

## 2021-12-14 ENCOUNTER — Ambulatory Visit
Admission: RE | Admit: 2021-12-14 | Discharge: 2021-12-14 | Disposition: A | Discharge status: Home / Self Care | Payer: Medicare Other | Source: Ambulatory Visit | Attending: Registered Nurse | Admitting: Registered Nurse

## 2021-12-14 ENCOUNTER — Encounter (HOSPITAL_BASED_OUTPATIENT_CLINIC_OR_DEPARTMENT_OTHER): Payer: Medicare Other | Admitting: Registered Nurse

## 2021-12-14 ENCOUNTER — Encounter: Payer: Self-pay | Admitting: Orthopaedic Surgery

## 2021-12-14 ENCOUNTER — Ambulatory Visit: Payer: Medicare Other | Admitting: Registered Nurse

## 2021-12-14 ENCOUNTER — Other Ambulatory Visit (HOSPITAL_COMMUNITY): Payer: Self-pay

## 2021-12-14 ENCOUNTER — Other Ambulatory Visit: Payer: Self-pay

## 2021-12-14 VITALS — BP 135/80 | HR 78 | Ht 63.0 in | Wt 229.0 lb

## 2021-12-14 VITALS — BP 137/83 | Ht 63.0 in | Wt 229.0 lb

## 2021-12-14 DIAGNOSIS — M25572 Pain in left ankle and joints of left foot: Secondary | ICD-10-CM

## 2021-12-14 DIAGNOSIS — Z5181 Encounter for therapeutic drug level monitoring: Secondary | ICD-10-CM

## 2021-12-14 DIAGNOSIS — G8929 Other chronic pain: Secondary | ICD-10-CM

## 2021-12-14 DIAGNOSIS — Z79891 Long term (current) use of opiate analgesic: Secondary | ICD-10-CM

## 2021-12-14 DIAGNOSIS — M545 Low back pain, unspecified: Secondary | ICD-10-CM

## 2021-12-14 DIAGNOSIS — G894 Chronic pain syndrome: Secondary | ICD-10-CM

## 2021-12-14 DIAGNOSIS — M7062 Trochanteric bursitis, left hip: Secondary | ICD-10-CM

## 2021-12-14 DIAGNOSIS — M797 Fibromyalgia: Secondary | ICD-10-CM

## 2021-12-14 DIAGNOSIS — Z6841 Body Mass Index (BMI) 40.0 and over, adult: Secondary | ICD-10-CM | POA: Diagnosis not present

## 2021-12-14 DIAGNOSIS — R0781 Pleurodynia: Secondary | ICD-10-CM | POA: Diagnosis not present

## 2021-12-14 DIAGNOSIS — M546 Pain in thoracic spine: Secondary | ICD-10-CM

## 2021-12-14 DIAGNOSIS — Z981 Arthrodesis status: Secondary | ICD-10-CM | POA: Diagnosis not present

## 2021-12-14 MED ORDER — HYDROCODONE-ACETAMINOPHEN 10-325 MG PO TABS
1.0000 | ORAL_TABLET | Freq: Three times a day (TID) | ORAL | 0 refills | Status: DC | PRN
Start: 2021-12-14 — End: 2022-01-14
  Filled 2021-12-14 – 2021-12-18 (×2): qty 90, 30d supply, fill #0

## 2021-12-14 NOTE — Progress Notes (Signed)
? ?Subjective:  ? ? Patient ID: Karina Greer, female    DOB: [birth year], 56 y.o.   MRN: [id number] ? ?HPI: Karina Greer is a 56 y.o. female who returns for follow up appointment for chronic pain and medication refill. She states her pain is located in her mid- lower back pain. She also reports bilateral Rib Pain since her mammogram, will obtain X-rays. She rates her pain 7. Her current exercise regime is walking and performing stretching exercises. ? ?Ms. Gaspard Morphine equivalent is  30.00MME.  She is also prescribed Clonazepam  by Dr. Melrose Nakayama  .We have discussed the black box warning of using opioids and benzodiazepines. I highlighted the dangers of using these drugs together and discussed the adverse events including respiratory suppression, overdose, cognitive impairment and importance of compliance with current regimen. We will continue to monitor and adjust as indicated.  ? ? Last Oral Swab was Performed on 10/19/2021, it was consistent.  ?  ?Pain Inventory ?Average Pain 8 ?Pain Right Now 7 ?My pain is constant, sharp, and stabbing ? ?In the last 24 hours, has pain interfered with the following? ?General activity 8 ?Relation with others 8 ?Enjoyment of life 8 ?What TIME of day is your pain at its worst? morning , daytime, evening, and night ?Sleep (in general) Fair ? ?Pain is worse with: walking, bending, sitting, inactivity, standing, unsure, and some activites ?Pain improves with: heat/ice, pacing activities, and medication ?Relief from Meds: 5 ? ?Family History  ?Problem Relation Age of Onset  ? Hypertension Mother   ? Diabetes Father   ? Brain cancer Father   ? Clotting disorder Father   ?     blood clots  ? Fibromyalgia Sister   ? Suicidality Sister   ? Hypertension Other   ?     Cancer, Cerebrovascular disease run on mother side of family  ? ?Social History  ? ?Socioeconomic History  ? Marital status: Divorced  ?  Spouse name: Not on file  ? Number of children: 3  ? Years of education: HS  ? Highest  education level: 12th grade  ?Occupational History  ? Occupation: Unemployed  ?  Employer: UNEMPLOYED  ?  Comment: Disability  ?Tobacco Use  ? Smoking status: Never  ? Smokeless tobacco: Never  ?Vaping Use  ? Vaping Use: Never used  ?Substance and Sexual Activity  ? Alcohol use: No  ? Drug use: No  ? Sexual activity: Not on file  ?Other Topics Concern  ? Not on file  ?Social History Narrative  ? Patient is right-handed. She avoids caffeine. She has recently been using the treadmill.  ? ?Social Determinants of Health  ? ?Financial Resource Strain: Not on file  ?Food Insecurity: Not on file  ?Transportation Needs: Not on file  ?Physical Activity: Not on file  ?Stress: Not on file  ?Social Connections: Not on file  ? ?Past Surgical History:  ?Procedure Laterality Date  ? ACHILLES TENDON SURGERY Right 3/16  ? ANTERIOR CERVICAL DECOMP/DISCECTOMY FUSION N/A 03/11/2016  ? Procedure: C5-6, C6-7 Anterior Cervical Discectomy and Fusion, Allograft, Plate;  Surgeon: Marybelle Killings, MD;  Location: [facility];  Service: Orthopedics;  Laterality: N/A;  ? CHOLECYSTECTOMY  2014  ? COLONOSCOPY WITH PROPOFOL N/A 08/25/2017  ? Procedure: COLONOSCOPY WITH PROPOFOL;  Surgeon: Manya Silvas, MD;  Location: [facility];  Service: Endoscopy;  Laterality: N/A;  ? CYSTO WITH HYDRODISTENSION N/A 01/26/2014  ? Procedure: CYSTOSCOPY/HYDRODISTENSION;  Surgeon: Bernestine Amass, MD;  Location: [facility];  Service: Urology;  Laterality: N/A;  ? CYSTO/  URETHRAL DILATION/  HYDRODISTENTION/   INSTILLATION THERAPY  07-16-2010//   12-30-2007//   10-27-2006  ? ELBOW SURGERY Right   ? EXERCISE TOLERENCE TEST  10-12-2010  ? NEGATIVE  ADEQUATE ETT/  NO ISCHEMIA OR EVIDENCE HIGH GRADE OBSTRUCTIVE CAD/  NO FURTHER TEST NEEDED  ? [place] ENDOMETRIAL ABLATION  2014  ? LAPAROSCOPIC OVARIAN CYST BX  2005  ? AND  URETEROSCOPIC LASER LITHO  STONE EXTRACTION  ? SHOULDER OPEN ROTATOR CUFF REPAIR Right 2004  ? TONSILLECTOMY    ?  TRANSTHORACIC ECHOCARDIOGRAM  06-07-2006  ? normal study/  ef 60-65%  ? TUBAL LIGATION Bilateral 1995  ? ?Past Surgical History:  ?Procedure Laterality Date  ? ACHILLES TENDON SURGERY Right 3/16  ? ANTERIOR CERVICAL DECOMP/DISCECTOMY FUSION N/A 03/11/2016  ? Procedure: C5-6, C6-7 Anterior Cervical Discectomy and Fusion, Allograft, Plate;  Surgeon: Marybelle Killings, MD;  Location: [facility];  Service: Orthopedics;  Laterality: N/A;  ? CHOLECYSTECTOMY  2014  ? COLONOSCOPY WITH PROPOFOL N/A 08/25/2017  ? Procedure: COLONOSCOPY WITH PROPOFOL;  Surgeon: Manya Silvas, MD;  [facility]: [facility];  Service: Endoscopy;  Laterality: N/A;  ? CYSTO WITH HYDRODISTENSION N/A 01/26/2014  ? Procedure: CYSTOSCOPY/HYDRODISTENSION;  Surgeon: Bernestine Amass, MD;  Location[facility];  Service: Urology;  Laterality: N/A;  ? CYSTO/  URETHRAL DILATION/  HYDRODISTENTION/   INSTILLATION THERAPY  07-16-2010//   12-30-2007//   10-27-2006  ? ELBOW SURGERY Right   ? EXERCISE TOLERENCE TEST  10-12-2010  ? NEGATIVE  ADEQUATE ETT/  NO ISCHEMIA OR EVIDENCE HIGH GRADE OBSTRUCTIVE CAD/  NO FURTHER TEST NEEDED  ? [place] ENDOMETRIAL ABLATION  2014  ? LAPAROSCOPIC OVARIAN CYST BX  2005  ? AND  URETEROSCOPIC LASER LITHO  STONE EXTRACTION  ? SHOULDER OPEN ROTATOR CUFF REPAIR Right 2004  ? TONSILLECTOMY    ? TRANSTHORACIC ECHOCARDIOGRAM  06-07-2006  ? normal study/  ef 60-65%  ? TUBAL LIGATION Bilateral 1995  ? ?Past Medical History:  ?Diagnosis Date  ? Anxiety   ? Anxiety disorder   ? Arthritis   ? Blepharitis   ? Chronic low back pain   ? Chronic pain   ? Depression   ? Diabetes mellitus without complication ([place])   ? Dyslipidemia   ? Family history of blood clots   ? Fibromyalgia   ? GERD (gastroesophageal reflux disease)   ? Headache   ? hx migraines  ? History of kidney stones   ? History of panic attacks   ? History of renal calculi   ? IBS (irritable bowel syndrome)   ? Interstitial cystitis   ? Lupus ([place])   ? tested  positive for antibodies for lupus. dr to do further tests  ? Menorrhagia   ? OSA on CPAP   ? not used cpap for several weeks  ? Pneumonia   ? hx  ? PONV (postoperative nausea and vomiting)   ? RLS (restless legs syndrome)   ? Rosacea   ? Seasonal asthma   ? SI (sacroiliac) joint dysfunction   ? SUI (stress urinary incontinence, female)   ? UTI (lower urinary tract infection)   ? hx  ? 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 ?BP 135/80   Pulse 78   Ht '5\' 3"'$  (1.6 m)   Wt 229 lb (103.9 kg)   SpO2 96%   BMI 40.57 kg/m?  ? ?  Opioid Risk Score:   ?Fall Risk Score:  `1 ? ?Depression screen PHQ 2/9 ? ? ?  12/04/2021  ?  1:00 PM 11/19/2021  ?  9:24 AM 10/19/2021  ?  9:45 AM 08/23/2021  ?  9:12 AM 07/24/2021  ? 10:28 AM 05/25/2021  ? 10:10 AM 04/30/2021  ? 10:23 AM  ?Depression screen PHQ 2/9  ?Decreased Interest 1 0 0 0 0 0 0  ?Down, Depressed, Hopeless 1 0 0 0 0 0 0  ?PHQ - 2 Score 2 0 0 0 0 0 0  ?  ?Review of Systems  ?Musculoskeletal:  Positive for arthralgias, back pain, myalgias and neck pain.  ?All other systems reviewed and are negative. ? ?   ?Objective:  ? Physical Exam ?Vitals and nursing note reviewed.  ?Constitutional:   ?   Appearance: Normal appearance.  ?Cardiovascular:  ?   Rate and Rhythm: Normal rate and regular rhythm.  ?   Pulses: Normal pulses.  ?   Heart sounds: Normal heart sounds.  ?Pulmonary:  ?   Effort: Pulmonary effort is normal.  ?   Breath sounds: Normal breath sounds.  ?Musculoskeletal:  ?   Cervical back: Normal range of motion and neck supple.  ?   Comments: Normal Muscle Bulk and Muscle Testing Reveals:  ?Upper Extremities: Full ROM and Muscle Strength 5/5 ?Thoracic Hypersensitivity ?Lumbar Paraspinal Tenderness: L-4-L-5 ?Lower Extremities: Full ROM and Muscle Strength 5/5 ?Arises from Chair slowly ?Narrow Based  Gait  ?   ?Skin: ?   General: Skin is warm and dry.  ?Neurological:  ?   Mental Status: She is alert and oriented to person, place, and time.  ?Psychiatric:     ?   Mood  and Affect: Mood normal.     ?   Behavior: Behavior normal.  ? ? ? ? ?   ?Assessment & Plan:  ?1. Lumbago/ Lumbar Spondylosis/ Left Lumbar Radiculitis: Continue to Monitor. 03/24203. ?Refilled: HYDROcodone 10/'325mg'$

## 2021-12-14 NOTE — Progress Notes (Signed)
? ?Office Visit Note ?  ?Patient: Karina Greer           ?Date of Birth: Apr 01, 1966           ?MRN: [id number] ?Visit Date: 12/14/2021 ?             ?Requested by: Cyndi Bender, PA-C ?[address] ?[place],  [place] [id number] ?PCP: Cyndi Bender, PA-C ? ? ?Assessment & Plan: ?Visit Diagnoses:  ?1. S/P cervical spinal fusion   ?2. Pain in left ankle and joints of left foot   ? ? ?Plan: Patient is a [place] at home from previous ankle surgery on her right ankle in the distant past she could use that intermittently if her left posterior tibial tendon starts bothering her.  We discussed crosstraining as she works on losing weight doing a little bit of elliptical on the flat, some bike and also some treadmill preferably flat not on an incline.  She can return if she has increased symptoms. ? ?Follow-Up Instructions: No follow-ups on file.  ? ?Orders:  ?No orders of the defined types were placed in this encounter. ? ?No orders of the defined types were placed in this encounter. ? ? ? ? Procedures: ?No procedures performed ? ? ?Clinical Data: ?No additional findings. ? ? ?Subjective: ?Chief Complaint  ?Patient presents with  ? Left Knee - Follow-up  ? Neck - Pain  ? ? ?HPI 56 year old female returns she was having problems with her posterior medial ankle pain that extended to the distal third of her leg.  She had an injection by Jeneen Rinks 3 weeks ago and states several days after the injection she started getting improvement in her ankle.  When she walks on the treadmill she has some increased posterior tibial discomfort and and points posterior to the medial malleolus.  States her knee really was not hurting that much.  Previous x-rays of her knee showed maintained joint space minimal tiny spurs symmetrical.  Patient's also had some discomfort in her neck and had been wearing collar intermittently.  Previous two-level fusion at C5-6 C6-7.  She had an MRI scan for soft tissue done 2 years ago and that did not show  stenosis at the C4-5 where she has some disc space narrowing and mild anterior spurring above her solid fusion.  Patient continues to have some low back discomfort. ? ?Review of Systems all other systems noncontributory to HPI. ? ? ?Objective: ?Vital Signs: BP 137/83   Ht '5\' 3"'$  (1.6 m)   Wt 229 lb (103.9 kg)   BMI 40.57 kg/m?  ? ?Physical Exam ?Constitutional:   ?   Appearance: She is well-developed.  ?HENT:  ?   Head: Normocephalic.  ?   Right Ear: External ear normal.  ?   Left Ear: External ear normal. There is no impacted cerumen.  ?Eyes:  ?   Pupils: Pupils are equal, round, and reactive to light.  ?Neck:  ?   Thyroid: No thyromegaly.  ?   Trachea: No tracheal deviation.  ?Cardiovascular:  ?   Rate and Rhythm: Normal rate.  ?Pulmonary:  ?   Effort: Pulmonary effort is normal.  ?Abdominal:  ?   Palpations: Abdomen is soft.  ?Musculoskeletal:  ?   Cervical back: No rigidity.  ?Skin: ?   General: Skin is warm and dry.  ?Neurological:  ?   Mental Status: She is alert and oriented to person, place, and time.  ?Psychiatric:     ?   Behavior: Behavior  normal.  ? ? ?Ortho Exam negative straight leg raising minimal sciatic notch tenderness right and left no trochanteric bursal tenderness negative logroll the hips.  Knee reaches full extension no medial or lateral joint line tenderness.  She is tender the posterior tibial tendon left ankle but has good strength.  She can toe walk heel walk EHL anterior tib peroneals posterior tib are strong with resistive testing. ? ?Specialty Comments:  ?No specialty comments available. ? ?Imaging: ?No results found. ? ? ?PMFS History: ?Patient Active Problem List  ? Diagnosis Date Noted  ? Pain in left ankle and joints of left foot 12/14/2021  ? Family history of blood clots 10/09/2021  ? Lateral epicondylitis, right elbow 05/25/2019  ? Atherosclerosis of aorta ([place]) 10/28/2018  ? Elevated glucose 10/28/2018  ? Mixed hyperlipidemia 10/28/2018  ? Hypothyroidism 07/20/2018  ? LLQ pain  12/03/2017  ? Pre-diabetes 06/03/2017  ? Rectal bleeding 06/02/2017  ? S/P cervical spinal fusion 03/11/2016  ? Bilateral hand pain 11/29/2015  ? Elevated C-reactive protein 11/29/2015  ? Elevated rheumatoid factor 11/29/2015  ? Raynaud's phenomenon without gangrene 11/29/2015  ? Bloating 09/12/2014  ? Dizziness 08/08/2014  ? Peri-menopause 08/08/2014  ? Chronic tension-type headache, intractable 07/13/2014  ? Gastroesophageal reflux disease without esophagitis 07/12/2014  ? Anxiety 04/18/2014  ? Chronic arthritis 04/18/2014  ? Cystitis 04/18/2014  ? Morbid obesity ([place]) 04/18/2014  ? Fibromyalgia 04/18/2014  ? 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 Osteoporosis, 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  ? Arthropathy 04/18/2014  ? Cephalalgia 04/04/2014  ? Numbness and tingling 04/04/2014  ? 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 Nonspecific abnormal findings on radiological and other examination of skull and head 02/23/2013  ? Other nonspecific abnormal result of function study of brain and central nervous system 07/01/2012  ? Disturbance of skin sensation 07/01/2012  ? Migraine without aura 07/01/2012  ? Abdominal pain 02/11/2012  ? Chronic fatigue syndrome 02/11/2012  ? Chronic interstitial cystitis 02/11/2012  ? Dyspareunia 02/11/2012  ? Dysuria 02/11/2012  ? Female stress incontinence 02/11/2012  ? Irritable bowel syndrome with diarrhea 02/11/2012  ? Nausea and vomiting 02/11/2012  ? Nocturia 02/11/2012  ? Urge incontinence 02/11/2012  ? Urinary urgency 02/11/2012  ? Lumbar spondylosis 02/04/2012  ? Myalgia and myositis 02/04/2012  ? Depression 02/04/2012  ? Lumbosacral spondylosis without myelopathy 02/04/2012  ? OBESITY, UNSPECIFIED 10/03/2010  ? GENERALIZED ANXIETY DISORDER 10/03/2010  ? Chest pain 10/03/2010  ? ?Past Medical History:  ?Diagnosis Date  ? Anxiety   ? Anxiety disorder   ? Arthritis   ? Blepharitis   ? Chronic low back pain   ? Chronic pain   ? Depression   ? Diabetes mellitus  without complication ([place])   ? Dyslipidemia   ? Family history of blood clots   ? Fibromyalgia   ? GERD (gastroesophageal reflux disease)   ? Headache   ? hx migraines  ? History of kidney stones   ? History of panic attacks   ? History of renal calculi   ? IBS (irritable bowel syndrome)   ? Interstitial cystitis   ? Lupus ([place])   ? 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 dr to do further tests  ? Menorrhagia   ? OSA on CPAP   ? not used cpap for several weeks  ? Pneumonia   ? hx  ? PONV (postoperative nausea and vomiting)   ? RLS (restless legs syndrome)   ? Rosacea   ? Seasonal asthma   ?  SI (sacroiliac) joint dysfunction   ? SUI (stress urinary incontinence, female)   ? UTI (lower urinary tract infection)   ? hx  ? 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  ?Family History  ?Problem Relation Age of Onset  ? Hypertension Mother   ? Diabetes Father   ? Brain cancer Father   ? Clotting disorder Father   ?     blood clots  ? Fibromyalgia Sister   ? Suicidality Sister   ? Hypertension Other   ?     Cancer, Cerebrovascular disease run on mother side of family  ?  ?Past Surgical History:  ?Procedure Laterality Date  ? ACHILLES TENDON SURGERY Right 3/16  ? ANTERIOR CERVICAL DECOMP/DISCECTOMY FUSION N/A 03/11/2016  ? Procedure: C5-6, C6-7 Anterior Cervical Discectomy and Fusion, Allograft, Plate;  Surgeon: Marybelle Killings, MD;  Location: [facility];  Service: Orthopedics;  Laterality: N/A;  ? CHOLECYSTECTOMY  2014  ? COLONOSCOPY WITH PROPOFOL N/A 08/25/2017  ? Procedure: COLONOSCOPY WITH PROPOFOL;  Surgeon: Manya Silvas, MD;  [facility];  Service: Endoscopy;  Laterality: N/A;  ? CYSTO WITH HYDRODISTENSION N/A 01/26/2014  ? Procedure: CYSTOSCOPY/HYDRODISTENSION;  Surgeon: Bernestine Amass, MD;  Location: [facility];  Service: Urology;  Laterality: N/A;  ? CYSTO/  URETHRAL DILATION/  HYDRODISTENTION/   INSTILLATION THERAPY  07-16-2010//   12-30-2007//   10-27-2006  ? ELBOW SURGERY Right    ? EXERCISE TOLERENCE TEST  10-12-2010  ? NEGATIVE  ADEQUATE ETT/  NO ISCHEMIA OR EVIDENCE HIGH GRADE OBSTRUCTIVE CAD/  NO FURTHER TEST NEEDED  ? [place] ENDOMETRIAL ABLATION  2014  ? LAPA

## 2021-12-18 ENCOUNTER — Other Ambulatory Visit (HOSPITAL_COMMUNITY): Payer: Self-pay

## 2021-12-21 DIAGNOSIS — R109 Unspecified abdominal pain: Secondary | ICD-10-CM | POA: Diagnosis not present

## 2021-12-21 DIAGNOSIS — M549 Dorsalgia, unspecified: Secondary | ICD-10-CM | POA: Diagnosis not present

## 2021-12-21 DIAGNOSIS — N201 Calculus of ureter: Secondary | ICD-10-CM | POA: Diagnosis not present

## 2021-12-21 DIAGNOSIS — N3946 Mixed incontinence: Secondary | ICD-10-CM | POA: Diagnosis not present

## 2021-12-24 ENCOUNTER — Ambulatory Visit: Payer: Medicare Other | Admitting: Internal Medicine

## 2021-12-24 DIAGNOSIS — E785 Hyperlipidemia, unspecified: Secondary | ICD-10-CM | POA: Diagnosis not present

## 2021-12-24 DIAGNOSIS — Z1331 Encounter for screening for depression: Secondary | ICD-10-CM | POA: Diagnosis not present

## 2021-12-24 DIAGNOSIS — E669 Obesity, unspecified: Secondary | ICD-10-CM | POA: Diagnosis not present

## 2021-12-24 DIAGNOSIS — Z0000 Encounter for general adult medical examination without abnormal findings: Secondary | ICD-10-CM | POA: Diagnosis not present

## 2021-12-24 DIAGNOSIS — Z9181 History of falling: Secondary | ICD-10-CM | POA: Diagnosis not present

## 2021-12-24 DIAGNOSIS — Z6839 Body mass index (BMI) 39.0-39.9, adult: Secondary | ICD-10-CM | POA: Diagnosis not present

## 2021-12-27 ENCOUNTER — Ambulatory Visit: Payer: Medicare Other

## 2021-12-27 ENCOUNTER — Ambulatory Visit: Payer: Medicare Other | Admitting: Internal Medicine

## 2022-01-02 ENCOUNTER — Ambulatory Visit (INDEPENDENT_AMBULATORY_CARE_PROVIDER_SITE_OTHER): Payer: Medicare Other

## 2022-01-02 ENCOUNTER — Ambulatory Visit (INDEPENDENT_AMBULATORY_CARE_PROVIDER_SITE_OTHER): Payer: Medicare Other | Admitting: Nurse Practitioner

## 2022-01-02 ENCOUNTER — Encounter: Payer: Self-pay | Admitting: Nurse Practitioner

## 2022-01-02 VITALS — BP 130/90 | HR 75 | Temp 98.9°F | Resp 16 | Ht 63.0 in | Wt 229.6 lb

## 2022-01-02 DIAGNOSIS — Z9989 Dependence on other enabling machines and devices: Secondary | ICD-10-CM | POA: Diagnosis not present

## 2022-01-02 DIAGNOSIS — N393 Stress incontinence (female) (male): Secondary | ICD-10-CM

## 2022-01-02 DIAGNOSIS — Z6841 Body Mass Index (BMI) 40.0 and over, adult: Secondary | ICD-10-CM

## 2022-01-02 DIAGNOSIS — R0602 Shortness of breath: Secondary | ICD-10-CM | POA: Diagnosis not present

## 2022-01-02 DIAGNOSIS — J301 Allergic rhinitis due to pollen: Secondary | ICD-10-CM | POA: Diagnosis not present

## 2022-01-02 DIAGNOSIS — R351 Nocturia: Secondary | ICD-10-CM

## 2022-01-02 DIAGNOSIS — G4733 Obstructive sleep apnea (adult) (pediatric): Secondary | ICD-10-CM | POA: Diagnosis not present

## 2022-01-02 DIAGNOSIS — J4540 Moderate persistent asthma, uncomplicated: Secondary | ICD-10-CM

## 2022-01-02 MED ORDER — ESTRADIOL 0.1 MG/GM VA CREA: 1.0000 | TOPICAL_CREAM | Freq: Every day | VAGINAL | 12 refills | Status: DC

## 2022-01-02 MED ORDER — FLUTICASONE PROPIONATE HFA 110 MCG/ACT IN AERO: INHALATION_SPRAY | RESPIRATORY_TRACT | 5 refills | Status: DC

## 2022-01-02 NOTE — Progress Notes (Signed)
[facility] ?[address] ?[place], [place] [address] ? ?Internal MEDICINE  ?Office Visit Note ? ?Patient Name: Karina Greer ? [id number]  ?[id number] ? ?Date of Service: 01/02/2022 ? ?Chief Complaint  ?Patient presents with  ?? Follow-up  ?  Sometimes doesn't feel like she can take a full/deep breath  ?? Asthma  ? ? ?HPI ?Karina Greer presents for a follow-up visit for asthma and obstructive sleep apnea.  She has asthma and has been fairly well controlled.  She is also receiving immunotherapy injections for her allergies which has helped her asthma as well as her allergic rhinitis symptoms. she also takes Human resources officer. ?She has obstructive sleep apnea and has a CPAP at home.  She has been trying to use the CPAP but has an issue where the mask falls off when she is asleep.  She reports that she is fine when she is awake and the mask stays on for well but she has really bad restless leg syndrome and cannot stay still while she sleeps.  She has been taking tizanidine 2 mg at bedtime to help with restless leg. ?Spirometry was done in office today and she has improved when compared to her last spirometry readings.  Although she does not feel like she can take a full deep breath.  She is interested in getting an incentive spirometer to practice deep breathing with. ? ? ?Current Medication: ?Outpatient Encounter Medications as of 01/02/2022  ?Medication Sig  ?? ACCU-CHEK GUIDE test strip USE 1 (ONE) EACH DAILY FOR GLUCOSE MONITORING  ?? albuterol (PROVENTIL) (2.5 MG/3ML) 0.083% nebulizer solution USE 1 VIAL IN NEBULIZER EVERY 6 HOURS  ?? albuterol (VENTOLIN HFA) 108 (90 Base) MCG/ACT inhaler TAKE 2 PUFFS BY MOUTH EVERY 4 TO 6 HOURS AS NEEDED  ?? aspirin 81 MG EC tablet Take by mouth.  ?? b complex vitamins tablet Take 1 tablet by mouth daily.  ?? calcitonin, salmon, (MIACALCIN/FORTICAL) 200 UNIT/ACT nasal spray Place 1 spray into alternate nostrils daily.   ?? cephALEXin (KEFLEX) 500 MG capsule Take 500 mg by mouth 2  (two) times daily.  ?? chlorhexidine (PERIDEX) 0.12 % solution SMARTSIG:By Mouth  ?? Cholecalciferol (VITAMIN D) 50 MCG (2000 UT) CAPS Take 1 capsule by mouth once a week.  ?? Cholecalciferol 25 MCG (1000 UT) tablet Take by mouth.  ?? DEXILANT 60 MG capsule Take 1 capsule by mouth daily as needed.  ?? dicyclomine (BENTYL) 10 MG capsule Take 10 mg by mouth 2 (two) times daily as needed.  ?? EPINEPHrine (EPIPEN 2-PAK) 0.3 mg/0.3 mL IJ SOAJ injection Use as directed  ?? estradiol (ESTRACE) 0.1 MG/GM vaginal cream Place 1 Applicatorful vaginally at bedtime. Every night for 2 weeks then decrease to twice weekly at night.  ?? fexofenadine (ALLEGRA) 180 MG tablet Take 180 mg by mouth daily.  ?? HYDROcodone-acetaminophen (NORCO) 10-325 MG tablet Take 1 tablet by mouth every 8 (eight) hours as needed for pain.  ?? hydrocortisone valerate cream (WESTCORT) 0.2 % Apply topically 2 (two) times daily.  ?? ketoconazole (NIZORAL) 2 % shampoo Apply 1 application topically 2 (two) times a week.   ?? KRILL OIL PO Take by mouth.  ?? lidocaine (LIDODERM) 5 % Place 1 patch onto the skin daily. Remove & Discard patch within 12 hours or as directed by MD  ?? MAGNESIUM OXIDE 400 PO   ?? meclizine (ANTIVERT) 25 MG tablet Take by mouth.  ?? ondansetron (ZOFRAN-ODT) 8 MG disintegrating tablet Take 8 mg by mouth 3 (three) times daily.  ??  PRESCRIPTION MEDICATION every 14 (fourteen) days. 2 ALLERGY INJECTIONS EVERY 2 WKS  ?? rizatriptan (MAXALT-MLT) 10 MG disintegrating tablet SMARTSIG:1 Tablet(s) By Mouth 1 to 2 Times Daily  ?? SPIRULINA PO Take 1 capsule by mouth daily.  ?? tiZANidine (ZANAFLEX) 2 MG tablet Take 1 tablet (2 mg total) by mouth every 8 (eight) hours as needed.  ?? valACYclovir (VALTREX) 1000 MG tablet Take by mouth.  ?? [DISCONTINUED] FLOVENT HFA 110 MCG/ACT inhaler INHALE 1 PUFF BY MOUTH TWICE A DAY  ?? fluticasone (FLOVENT HFA) 110 MCG/ACT inhaler INHALE 1 PUFF BY MOUTH TWICE A DAY  ?? [DISCONTINUED] Continuous Blood Gluc  Receiver (FREESTYLE LIBRE 2 READER) DEVI Use as directed daily (Patient not taking: Reported on 01/02/2022)  ?? [DISCONTINUED] Continuous Blood Gluc Sensor (FREESTYLE LIBRE 2 SENSOR) MISC Use as directed daily.  ?? [DISCONTINUED] Continuous Blood Gluc Sensor (FREESTYLE LIBRE 2 SENSOR) MISC See admin instructions.  ?? [DISCONTINUED] ezetimibe (ZETIA) 10 MG tablet Take by mouth.  ? ?No facility-administered encounter medications on file as of 01/02/2022.  ? ? ?Surgical History: ?Past Surgical History:  ?Procedure Laterality Date  ?? ACHILLES TENDON SURGERY Right 3/16  ?? ANTERIOR CERVICAL DECOMP/DISCECTOMY FUSION N/A 03/11/2016  ? Procedure: C5-6, C6-7 Anterior Cervical Discectomy and Fusion, Allograft, Plate;  Surgeon: Marybelle Killings, MD;  Location: [facility];  Service: Orthopedics;  Laterality: N/A;  ?? CHOLECYSTECTOMY  2014  ?? COLONOSCOPY WITH PROPOFOL N/A 08/25/2017  ? Procedure: COLONOSCOPY WITH PROPOFOL;  Surgeon: Manya Silvas, MD;  Location: [facility];  Service: Endoscopy;  Laterality: N/A;  ?? CYSTO WITH HYDRODISTENSION N/A 01/26/2014  ? Procedure: CYSTOSCOPY/HYDRODISTENSION;  Surgeon: Bernestine Amass, MD;  Location: [facility];  Service: Urology;  Laterality: N/A;  ?? CYSTO/  URETHRAL DILATION/  HYDRODISTENTION/   INSTILLATION THERAPY  07-16-2010//   12-30-2007//   10-27-2006  ?? ELBOW SURGERY Right   ?? EXERCISE TOLERENCE TEST  10-12-2010  ? NEGATIVE  ADEQUATE ETT/  NO ISCHEMIA OR EVIDENCE HIGH GRADE OBSTRUCTIVE CAD/  NO FURTHER TEST NEEDED  ?? HYSTEROSCOPY W/  NOVASURE ENDOMETRIAL ABLATION  2014  ?? LAPAROSCOPIC OVARIAN CYST BX  2005  ? AND  URETEROSCOPIC LASER LITHO  STONE EXTRACTION  ?? SHOULDER OPEN ROTATOR CUFF REPAIR Right 2004  ?? TONSILLECTOMY    ?? TRANSTHORACIC ECHOCARDIOGRAM  06-07-2006  ? normal study/  ef 60-65%  ?? TUBAL LIGATION Bilateral 1995  ? ? ?Medical History: ?Past Medical History:  ?Diagnosis Date  ?? Anxiety   ?? Anxiety disorder   ?? Arthritis   ?? Blepharitis   ??  Chronic low back pain   ?? Chronic pain   ?? Depression   ?? Diabetes mellitus without complication ([place])   ?? Dyslipidemia   ?? Family history of blood clots   ?? Fibromyalgia   ?? GERD (gastroesophageal reflux disease)   ?? Headache   ? hx migraines  ?? History of kidney stones   ?? History of panic attacks   ?? History of renal calculi   ?? IBS (irritable bowel syndrome)   ?? Interstitial cystitis   ?? Lupus ([place])   ? 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 dr to do further tests  ?? Menorrhagia   ?? OSA on CPAP   ? not used cpap for several weeks  ?? Pneumonia   ? hx  ?? PONV (postoperative nausea and vomiting)   ?? RLS (restless legs syndrome)   ?? Rosacea   ?? Seasonal asthma   ?? SI (sacroiliac) joint dysfunction   ??  SUI (stress urinary incontinence, female)   ?? UTI (lower urinary tract infection)   ? hx  ?? 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 ? ?Family History: ?Family History  ?Problem Relation Age of Onset  ?? Hypertension Mother   ?? Diabetes Father   ?? Brain cancer Father   ?? Clotting disorder Father   ?     blood clots  ?? Fibromyalgia Sister   ?? Suicidality Sister   ?? Hypertension Other   ?     Cancer, Cerebrovascular disease run on mother side of family  ? ? ?Social History  ? ?Socioeconomic History  ?? Marital status: Divorced  ?  Spouse name: Not on file  ?? Number of children: 3  ?? Years of education: HS  ?? Highest education level: 12th grade  ?Occupational History  ?? Occupation: Unemployed  ?  Employer: UNEMPLOYED  ?  Comment: Disability  ?Tobacco Use  ?? Smoking status: Never  ?? Smokeless tobacco: Never  ?Vaping Use  ?? Vaping Use: Never used  ?Substance and Sexual Activity  ?? Alcohol use: No  ?? Drug use: No  ?? Sexual activity: Not on file  ?Other Topics Concern  ?? Not on file  ?Social History Narrative  ? Patient is right-handed. She avoids caffeine. She has recently been using the treadmill.  ? ?Social Determinants of Health  ? ?Financial Resource Strain: Not  on file  ?Food Insecurity: Not on file  ?Transportation Needs: Not on file  ?Physical Activity: Not on file  ?Stress: Not on file  ?Social Connections: Not on file  ?Intimate Partner Violence: Not on file  ? ? ?

## 2022-01-08 DIAGNOSIS — R413 Other amnesia: Secondary | ICD-10-CM | POA: Diagnosis not present

## 2022-01-08 DIAGNOSIS — I679 Cerebrovascular disease, unspecified: Secondary | ICD-10-CM | POA: Diagnosis not present

## 2022-01-10 ENCOUNTER — Ambulatory Visit: Payer: Medicare Other

## 2022-01-11 ENCOUNTER — Other Ambulatory Visit: Payer: Self-pay

## 2022-01-11 ENCOUNTER — Telehealth: Payer: Self-pay

## 2022-01-11 ENCOUNTER — Encounter: Payer: Self-pay | Admitting: Nurse Practitioner

## 2022-01-11 MED ORDER — ALBUTEROL SULFATE (2.5 MG/3ML) 0.083% IN NEBU: INHALATION_SOLUTION | RESPIRATORY_TRACT | 11 refills | Status: AC

## 2022-01-11 NOTE — Telephone Encounter (Signed)
Refill sent.

## 2022-01-14 ENCOUNTER — Encounter: Payer: Self-pay | Admitting: Registered Nurse

## 2022-01-14 ENCOUNTER — Encounter: Payer: Medicare Other | Attending: Physical Medicine & Rehabilitation | Admitting: Registered Nurse

## 2022-01-14 ENCOUNTER — Other Ambulatory Visit (HOSPITAL_COMMUNITY): Payer: Self-pay

## 2022-01-14 VITALS — BP 124/84 | HR 67 | Ht 63.0 in | Wt 229.0 lb

## 2022-01-14 DIAGNOSIS — G894 Chronic pain syndrome: Secondary | ICD-10-CM | POA: Insufficient documentation

## 2022-01-14 DIAGNOSIS — M7062 Trochanteric bursitis, left hip: Secondary | ICD-10-CM | POA: Diagnosis not present

## 2022-01-14 DIAGNOSIS — Z79891 Long term (current) use of opiate analgesic: Secondary | ICD-10-CM | POA: Insufficient documentation

## 2022-01-14 DIAGNOSIS — M5416 Radiculopathy, lumbar region: Secondary | ICD-10-CM | POA: Insufficient documentation

## 2022-01-14 DIAGNOSIS — M542 Cervicalgia: Secondary | ICD-10-CM | POA: Insufficient documentation

## 2022-01-14 DIAGNOSIS — Z5181 Encounter for therapeutic drug level monitoring: Secondary | ICD-10-CM | POA: Insufficient documentation

## 2022-01-14 DIAGNOSIS — M797 Fibromyalgia: Secondary | ICD-10-CM | POA: Insufficient documentation

## 2022-01-14 MED ORDER — HYDROCODONE-ACETAMINOPHEN 10-325 MG PO TABS
1.0000 | ORAL_TABLET | Freq: Three times a day (TID) | ORAL | 0 refills | Status: DC | PRN
Start: 2022-01-14 — End: 2022-02-11
  Filled 2022-01-14 – 2022-01-15 (×2): qty 90, 30d supply, fill #0

## 2022-01-14 NOTE — Progress Notes (Signed)
? ?Subjective:  ? ? Patient ID: Karina Greer, female    DOB: 13-Aug-1966, 56 y.o.   MRN: [id number] ? ?HPI: Karina Greer is a 56 y.o. female who returns for follow up appointment for chronic pain and medication refill. She states her pain is located in her neck, mid- lower back radiating into her left hip pain. She rates her pain 5. Her current exercise regime is walking and performing stretching exercises. ? ?Karina Greer states two weeks ago it was raining and when she awaken, she felt as though her body was frozen, asked her to elaborate she states" her body was experiencing stiffness and had to ask her son for assistance with getting out of bed. Today, she denies the above, she was instructed to F/U with her neurologist, she verbalizes understanding.  ? ?Karina Greer Morphine equivalent is 30.00 MME.   Last Oral Swab was Performed on 10/19/2021, it was consistent.  ?  ? ?Pain Inventory ?Average Pain 8 ?Pain Right Now 5 ?My pain is constant, sharp, stabbing, and aching ? ?In the last 24 hours, has pain interfered with the following? ?General activity 6 ?Relation with others 6 ?Enjoyment of life 4 ?What TIME of day is your pain at its worst? morning , daytime, evening, and night ?Sleep (in general) Fair ? ?Pain is worse with: walking, bending, sitting, inactivity, standing, unsure, and some activites ?Pain improves with: rest, heat/ice, therapy/exercise, pacing activities, medication, TENS, and injections ?Relief from Meds: 7 ? ?Family History  ?Problem Relation Age of Onset  ? Hypertension Mother   ? Diabetes Father   ? Brain cancer Father   ? Clotting disorder Father   ?     blood clots  ? Fibromyalgia Sister   ? Suicidality Sister   ? Hypertension Other   ?     Cancer, Cerebrovascular disease run on mother side of family  ? ?Social History  ? ?Socioeconomic History  ? Marital status: Divorced  ?  Spouse name: Not on file  ? Number of children: 3  ? Years of education: HS  ? Highest education level: 12th grade   ?Occupational History  ? Occupation: Unemployed  ?  Employer: UNEMPLOYED  ?  Comment: Disability  ?Tobacco Use  ? Smoking status: Never  ? Smokeless tobacco: Never  ?Vaping Use  ? Vaping Use: Never used  ?Substance and Sexual Activity  ? Alcohol use: No  ? Drug use: No  ? Sexual activity: Not on file  ?Other Topics Concern  ? Not on file  ?Social History Narrative  ? Patient is right-handed. She avoids caffeine. She has recently been using the treadmill.  ? ?Social Determinants of Health  ? ?Financial Resource Strain: Not on file  ?Food Insecurity: Not on file  ?Transportation Needs: Not on file  ?Physical Activity: Not on file  ?Stress: Not on file  ?Social Connections: Not on file  ? ?Past Surgical History:  ?Procedure Laterality Date  ? ACHILLES TENDON SURGERY Right 3/16  ? ANTERIOR CERVICAL DECOMP/DISCECTOMY FUSION N/A 03/11/2016  ? Procedure: C5-6, C6-7 Anterior Cervical Discectomy and Fusion, Allograft, Plate;  Surgeon: Marybelle Killings, MD;  Location: [facility];  Service: Orthopedics;  Laterality: N/A;  ? CHOLECYSTECTOMY  2014  ? COLONOSCOPY WITH PROPOFOL N/A 08/25/2017  ? Procedure: COLONOSCOPY WITH PROPOFOL;  Surgeon: Manya Silvas, MD;  Location: [facility]COPY;  Service: Endoscopy;  Laterality: N/A;  ? CYSTO WITH HYDRODISTENSION N/A 01/26/2014  ? Procedure: CYSTOSCOPY/HYDRODISTENSION;  Surgeon: Bernestine Amass, MD;  Location: [facility];  Service: Urology;  Laterality: N/A;  ? CYSTO/  URETHRAL DILATION/  HYDRODISTENTION/   INSTILLATION THERAPY  07-16-2010//   12-30-2007//   10-27-2006  ? ELBOW SURGERY Right   ? EXERCISE TOLERENCE TEST  10-12-2010  ? NEGATIVE  ADEQUATE ETT/  NO ISCHEMIA OR EVIDENCE HIGH GRADE OBSTRUCTIVE CAD/  NO FURTHER TEST NEEDED  ? [place] ENDOMETRIAL ABLATION  2014  ? LAPAROSCOPIC OVARIAN CYST BX  2005  ? AND  URETEROSCOPIC LASER LITHO  STONE EXTRACTION  ? SHOULDER OPEN ROTATOR CUFF REPAIR Right 2004  ? TONSILLECTOMY    ? TRANSTHORACIC ECHOCARDIOGRAM   06-07-2006  ? normal study/  ef 60-65%  ? TUBAL LIGATION Bilateral 1995  ? ?Past Surgical History:  ?Procedure Laterality Date  ? ACHILLES TENDON SURGERY Right 3/16  ? ANTERIOR CERVICAL DECOMP/DISCECTOMY FUSION N/A 03/11/2016  ? Procedure: C5-6, C6-7 Anterior Cervical Discectomy and Fusion, Allograft, Plate;  Surgeon: Marybelle Killings, MD;  Location: [facility];  Service: Orthopedics;  Laterality: N/A;  ? CHOLECYSTECTOMY  2014  ? COLONOSCOPY WITH PROPOFOL N/A 08/25/2017  ? Procedure: COLONOSCOPY WITH PROPOFOL;  Surgeon: Manya Silvas, MD;  Location[facility]COPY;  Service: Endoscopy;  Laterality: N/A;  ? CYSTO WITH HYDRODISTENSION N/A 01/26/2014  ? Procedure: CYSTOSCOPY/HYDRODISTENSION;  Surgeon: Bernestine Amass, MD;  Location: [facility];  Service: Urology;  Laterality: N/A;  ? CYSTO/  URETHRAL DILATION/  HYDRODISTENTION/   INSTILLATION THERAPY  07-16-2010//   12-30-2007//   10-27-2006  ? ELBOW SURGERY Right   ? EXERCISE TOLERENCE TEST  10-12-2010  ? NEGATIVE  ADEQUATE ETT/  NO ISCHEMIA OR EVIDENCE HIGH GRADE OBSTRUCTIVE CAD/  NO FURTHER TEST NEEDED  ? [place] ENDOMETRIAL ABLATION  2014  ? LAPAROSCOPIC OVARIAN CYST BX  2005  ? AND  URETEROSCOPIC LASER LITHO  STONE EXTRACTION  ? SHOULDER OPEN ROTATOR CUFF REPAIR Right 2004  ? TONSILLECTOMY    ? TRANSTHORACIC ECHOCARDIOGRAM  06-07-2006  ? normal study/  ef 60-65%  ? TUBAL LIGATION Bilateral 1995  ? ?Past Medical History:  ?Diagnosis Date  ? Anxiety   ? Anxiety disorder   ? Arthritis   ? Blepharitis   ? Chronic low back pain   ? Chronic pain   ? Depression   ? Diabetes mellitus without complication ([place])   ? Dyslipidemia   ? Family history of blood clots   ? Fibromyalgia   ? GERD (gastroesophageal reflux disease)   ? Headache   ? hx migraines  ? History of kidney stones   ? History of panic attacks   ? History of renal calculi   ? IBS (irritable bowel syndrome)   ? Interstitial cystitis   ? Lupus ([place])   ? tested positive for antibodies for  lupus. dr to do further tests  ? Menorrhagia   ? OSA on CPAP   ? not used cpap for several weeks  ? Pneumonia   ? hx  ? PONV (postoperative nausea and vomiting)   ? RLS (restless legs syndrome)   ? Rosacea   ? Seasonal asthma   ? SI (sacroiliac) joint dysfunction   ? SUI (stress urinary incontinence, female)   ? UTI (lower urinary tract infection)   ? hx  ? 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 ?BP 124/84   Pulse 67   Ht '5\' 3"'$  (1.6 m)   Wt 229 lb (103.9 kg)   SpO2 94%  BMI 40.57 kg/m?  ? ?Opioid Risk Score:   ?Fall Risk Score:  `1 ? ?Depression screen PHQ 2/9 ? ? ?  01/14/2022  ?  9:30 AM 12/14/2021  ?  1:03 PM 12/04/2021  ?  1:00 PM 11/19/2021  ?  9:24 AM 10/19/2021  ?  9:45 AM 08/23/2021  ?  9:12 AM 07/24/2021  ? 10:28 AM  ?Depression screen PHQ 2/9  ?Decreased Interest 0 1 1 0 0 0 0  ?Down, Depressed, Hopeless 0 1 1 0 0 0 0  ?PHQ - 2 Score 0 2 2 0 0 0 0  ?  ? ?Review of Systems  ?Musculoskeletal:  Positive for myalgias.  ?All other systems reviewed and are negative. ? ?   ?Objective:  ? Physical Exam ?Vitals and nursing note reviewed.  ?Constitutional:   ?   Appearance: Normal appearance.  ?Cardiovascular:  ?   Rate and Rhythm: Normal rate and regular rhythm.  ?   Pulses: Normal pulses.  ?   Heart sounds: Normal heart sounds.  ?Pulmonary:  ?   Effort: Pulmonary effort is normal.  ?   Breath sounds: Normal breath sounds.  ?Musculoskeletal:  ?   Cervical back: Normal range of motion and neck supple.  ?   Comments: Normal Muscle Bulk and Muscle Testing Reveals:  ?Upper Extremities: Full ROM and Muscle Strength 5/5 ?Thoracic Paraspinal Tenderness: T-7-T-9  ?Lumbar Paraspinal Tenderness: L-4-L-5 ?Lower Extremities: Full ROM and Muscle Strength 5/5 ?Arises from Chair with ease ?Narrow Based  Gait  ?   ?Skin: ?   General: Skin is warm and dry.  ?Neurological:  ?   Mental Status: She is alert and oriented to person, place, and time.  ?Psychiatric:     ?   Mood and Affect: Mood normal.     ?   Behavior:  Behavior normal.  ? ? ? ? ?   ?Assessment & Plan:  ?1. Lumbago/ Lumbar Spondylosis/ Left Lumbar Radiculitis: Continue to Monitor. 04/24203. ?Refilled: HYDROcodone 10/'325mg'$  one tablet every 8 hours as needed #90.  ?We

## 2022-01-15 ENCOUNTER — Other Ambulatory Visit (HOSPITAL_COMMUNITY): Payer: Self-pay

## 2022-01-16 ENCOUNTER — Other Ambulatory Visit (HOSPITAL_COMMUNITY): Payer: Self-pay

## 2022-01-17 ENCOUNTER — Ambulatory Visit: Payer: Medicare Other

## 2022-01-22 ENCOUNTER — Other Ambulatory Visit: Payer: Self-pay

## 2022-01-22 DIAGNOSIS — Z8249 Family history of ischemic heart disease and other diseases of the circulatory system: Secondary | ICD-10-CM

## 2022-01-22 DIAGNOSIS — G9332 Myalgic encephalomyelitis/chronic fatigue syndrome: Secondary | ICD-10-CM

## 2022-01-23 ENCOUNTER — Inpatient Hospital Stay (HOSPITAL_BASED_OUTPATIENT_CLINIC_OR_DEPARTMENT_OTHER): Payer: Medicare Other | Admitting: Oncology

## 2022-01-23 ENCOUNTER — Inpatient Hospital Stay: Payer: Medicare Other | Attending: Oncology

## 2022-01-23 ENCOUNTER — Ambulatory Visit (INDEPENDENT_AMBULATORY_CARE_PROVIDER_SITE_OTHER): Payer: Medicare Other

## 2022-01-23 ENCOUNTER — Encounter: Payer: Self-pay | Admitting: Oncology

## 2022-01-23 VITALS — BP 147/96 | HR 78 | Temp 97.5°F | Wt 229.0 lb

## 2022-01-23 DIAGNOSIS — R9082 White matter disease, unspecified: Secondary | ICD-10-CM | POA: Diagnosis not present

## 2022-01-23 DIAGNOSIS — D751 Secondary polycythemia: Secondary | ICD-10-CM | POA: Diagnosis not present

## 2022-01-23 DIAGNOSIS — M329 Systemic lupus erythematosus, unspecified: Secondary | ICD-10-CM | POA: Insufficient documentation

## 2022-01-23 DIAGNOSIS — Z832 Family history of diseases of the blood and blood-forming organs and certain disorders involving the immune mechanism: Secondary | ICD-10-CM | POA: Insufficient documentation

## 2022-01-23 DIAGNOSIS — J301 Allergic rhinitis due to pollen: Secondary | ICD-10-CM | POA: Diagnosis not present

## 2022-01-23 DIAGNOSIS — Z8249 Family history of ischemic heart disease and other diseases of the circulatory system: Secondary | ICD-10-CM | POA: Diagnosis not present

## 2022-01-23 DIAGNOSIS — G9332 Myalgic encephalomyelitis/chronic fatigue syndrome: Secondary | ICD-10-CM

## 2022-01-23 LAB — CBC WITH DIFFERENTIAL/PLATELET
Abs Immature Granulocytes: 0.03 10*3/uL (ref 0.00–0.07)
Basophils Absolute: 0.1 10*3/uL (ref 0.0–0.1)
Basophils Relative: 2 %
Eosinophils Absolute: 0.1 10*3/uL (ref 0.0–0.5)
Eosinophils Relative: 1 %
HCT: 46.1 % — ABNORMAL HIGH (ref 36.0–46.0)
Hemoglobin: 15.6 g/dL — ABNORMAL HIGH (ref 12.0–15.0)
Immature Granulocytes: 0 %
Lymphocytes Relative: 22 %
Lymphs Abs: 1.9 10*3/uL (ref 0.7–4.0)
MCH: 29.3 pg (ref 26.0–34.0)
MCHC: 33.8 g/dL (ref 30.0–36.0)
MCV: 86.7 fL (ref 80.0–100.0)
Monocytes Absolute: 0.4 10*3/uL (ref 0.1–1.0)
Monocytes Relative: 5 %
Neutro Abs: 5.9 10*3/uL (ref 1.7–7.7)
Neutrophils Relative %: 70 %
Platelets: 307 10*3/uL (ref 150–400)
RBC: 5.32 MIL/uL — ABNORMAL HIGH (ref 3.87–5.11)
RDW: 11.9 % (ref 11.5–15.5)
WBC: 8.5 10*3/uL (ref 4.0–10.5)
nRBC: 0 % (ref 0.0–0.2)

## 2022-01-23 NOTE — Progress Notes (Signed)
?Hematology/Oncology Progress note ?[phone] [phone] ?  ? ?   ? ? ?Patient Care Team: ?Fae Pippin as PCP - General (Physician Assistant) ?Earlie Server, MD as Consulting Physician (Hematology) ?Anabel Bene, MD as Consulting Physician (Neurology) ?Andrez Grime, MD as Consulting Physician (Cardiology) ?Mikel Cella, MD as Consulting Physician (Neurology) ? ?REFERRING PROVIDER: ?Cyndi Bender, PA-C  ?CHIEF COMPLAINTS/REASON FOR VISIT:  ?Evaluation of family history of clotting disorder.,  Erythrocytosis ? ?HISTORY OF PRESENTING ILLNESS:  ? ?Karina Greer is a  56 y.o.  female with PMH listed below was seen in consultation at the request of  Cyndi Bender, Hershal Coria  for evaluation of family history of clotting disorder. ? ?Patient follows up with neurology Dr. 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 ?Patient has a history of neuralgia, migraine, fibromyalgia, anxiety, depression.  ?Her previous MRI of the brain suggested increased vascular disease for his age, white matter hyperintensities.  Her MRI done on 08/12/2020 was not available to me.  Per her neurologist note, there were progression in the white matter changes in the anterior temporal lobe suggestive of the O'Sullivan's sign as well as involvement of the external capsule left more than right. These neuroimaging findings can be seen in patients with CADASIL. ? ?Also report a family history of blood clots in her mom.  Details are not known, provoked versus unprovoked.  She remembers that her mother has been on blood thinners.  No other family members with blood clots.  Patient denies any previous personal history of blood clots. ? ?Patient was recommended to get prednisone testing however per patient, there was some issue about testing approval. ?Patient was referred to establish care with hematology for hypercoagulable work-up for stroke. ? ?Lupus was listed on her medical history, with a note saying that  she was tested positive.  Patient reports being seen by rheumatology Dr. Jefm Bryant and had a blood work done-patient reports that she may have lost follow-up for Dr. Jefm Bryant never asked her to follow-up. ? ? ?INTERVAL HISTORY ?[name] is a 56 y.o. female who has above history reviewed by me today presents for follow up visit for follow-up of erythrocytosis, review results. ?She has no new complaints. ?Further medical records reviewed of previous her rheumatology evaluation. ?11/29/2015, seen by rheumatology Dr. Meda Coffee.  Per note, patient has elevated rheumatoid factor, elevated C RP, fibromyalgia and Raynaud's syndrome without gangrene.  No official diagnosis of SLE. ?06/24/2018 patient was seen by Dr. Jefm Bryant.  Per note, patient had a one-time having positive ANA, follow-up was nondiagnostic.  Had advise NSAIDs with low likelihood.  Low titer rheumatoid factor at 17, CCP antibodies negative.  She was not diagnosed with SLE. ? ?Patient reports history of sleep apnea, not using CPAP machine. ? ?Review of Systems  ?Constitutional:  Negative for appetite change, chills, fatigue and fever.  ?HENT:   Negative for hearing loss and voice change.   ?Eyes:  Negative for eye problems.  ?Respiratory:  Negative for chest tightness and cough.   ?Cardiovascular:  Negative for chest pain.  ?Gastrointestinal:  Negative for abdominal distention, abdominal pain and blood in stool.  ?Endocrine: Negative for hot flashes.  ?Genitourinary:  Negative for difficulty urinating and frequency.   ?     Migraine  ?Musculoskeletal:  Positive for back pain and myalgias. Negative for arthralgias.  ?Skin:  Negative for itching and rash.  ?Neurological:  Negative for extremity weakness.  ?     Difficulty with thought processing  ?  Hematological:  Negative for adenopathy.  ?Psychiatric/Behavioral:  Negative for confusion.   ? ?MEDICAL HISTORY:  ?Past Medical History:  ?Diagnosis Date  ? Anxiety   ? Anxiety disorder   ? Arthritis   ? Blepharitis    ? Chronic low back pain   ? Chronic pain   ? Depression   ? Diabetes mellitus without complication ([place])   ? Dyslipidemia   ? Family history of blood clots   ? Fibromyalgia   ? GERD (gastroesophageal reflux disease)   ? Headache   ? hx migraines  ? History of kidney stones   ? History of panic attacks   ? History of renal calculi   ? IBS (irritable bowel syndrome)   ? Interstitial cystitis   ? Lupus ([place])   ? 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 dr to do further tests  ? Menorrhagia   ? OSA on CPAP   ? not used cpap for several weeks  ? Pneumonia   ? hx  ? PONV (postoperative nausea and vomiting)   ? RLS (restless legs syndrome)   ? Rosacea   ? Seasonal asthma   ? SI (sacroiliac) joint dysfunction   ? SUI (stress urinary incontinence, female)   ? UTI (lower urinary tract infection)   ? hx  ? 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 ? ?SURGICAL HISTORY: ?Past Surgical History:  ?Procedure Laterality Date  ? ACHILLES TENDON SURGERY Right 3/16  ? ANTERIOR CERVICAL DECOMP/DISCECTOMY FUSION N/A 03/11/2016  ? Procedure: C5-6, C6-7 Anterior Cervical Discectomy and Fusion, Allograft, Plate;  Surgeon: Marybelle Killings, MD;  Location: [place];  Service: Orthopedics;  Laterality: N/A;  ? CHOLECYSTECTOMY  2014  ? COLONOSCOPY WITH PROPOFOL N/A 08/25/2017  ? Procedure: COLONOSCOPY WITH PROPOFOL;  Surgeon: Manya Silvas, MD;  Location: [facility] ENDOSCOPY;  Service: Endoscopy;  Laterality: N/A;  ? CYSTO WITH HYDRODISTENSION N/A 01/26/2014  ? Procedure: CYSTOSCOPY/HYDRODISTENSION;  Surgeon: Bernestine Amass, MD;  Location: [facility];  Service: Urology;  Laterality: N/A;  ? CYSTO/  URETHRAL DILATION/  HYDRODISTENTION/   INSTILLATION THERAPY  07-16-2010//   12-30-2007//   10-27-2006  ? ELBOW SURGERY Right   ? EXERCISE TOLERENCE TEST  10-12-2010  ? NEGATIVE  ADEQUATE ETT/  NO ISCHEMIA OR EVIDENCE HIGH GRADE OBSTRUCTIVE CAD/  NO FURTHER TEST NEEDED  ? [place] ENDOMETRIAL ABLATION  2014  ?  LAPAROSCOPIC OVARIAN CYST BX  2005  ? AND  URETEROSCOPIC LASER LITHO  STONE EXTRACTION  ? SHOULDER OPEN ROTATOR CUFF REPAIR Right 2004  ? TONSILLECTOMY    ? TRANSTHORACIC ECHOCARDIOGRAM  06-07-2006  ? normal study/  ef 60-65%  ? TUBAL LIGATION Bilateral 1995  ? ? ?SOCIAL HISTORY: ?Social History  ? ?Socioeconomic History  ? Marital status: Divorced  ?  Spouse name: Not on file  ? Number of children: 3  ? Years of education: HS  ? Highest education level: 12th grade  ?Occupational History  ? Occupation: Unemployed  ?  Employer: UNEMPLOYED  ?  Comment: Disability  ?Tobacco Use  ? Smoking status: Never  ? Smokeless tobacco: Never  ?Vaping Use  ? Vaping Use: Never used  ?Substance and Sexual Activity  ? Alcohol use: No  ? Drug use: No  ? Sexual activity: Not on file  ?Other Topics Concern  ? Not on file  ?Social History Narrative  ? Patient is right-handed. She avoids caffeine. She has recently been using the treadmill.  ? ?Social  Determinants of Health  ? ?Financial Resource Strain: Not on file  ?Food Insecurity: Not on file  ?Transportation Needs: Not on file  ?Physical Activity: Not on file  ?Stress: Not on file  ?Social Connections: Not on file  ?Intimate Partner Violence: Not on file  ? ? ?FAMILY HISTORY: ?Family History  ?Problem Relation Age of Onset  ? Hypertension Mother   ? Diabetes Father   ? Brain cancer Father   ? Clotting disorder Father   ?     blood clots  ? Fibromyalgia Sister   ? Suicidality Sister   ? Hypertension Other   ?     Cancer, Cerebrovascular disease run on mother side of family  ? ? ?ALLERGIES:  is allergic to alendronate sodium, doxycycline, gadolinium derivatives, iodinated contrast media, milnacipran hcl, elavil [amitriptyline hcl], tramadol, carbapenems, codeine sulfate, cymbalta [duloxetine hcl], duloxetine, lamotrigine, morphine, nsaids, other, ropinirole, skelaxin, soolantra [ivermectin], tessalon [benzonatate], diphenhydramine hcl, gabapentin, oxycodone, oxycodone-acetaminophen, and  tetracycline. ? ?MEDICATIONS:  ?Current Outpatient Medications  ?Medication Sig Dispense Refill  ? ACCU-CHEK GUIDE test strip USE 1 (ONE) EACH DAILY FOR GLUCOSE MONITORING    ? albuterol (PROVENTIL) (2.5 MG/

## 2022-01-24 ENCOUNTER — Ambulatory Visit: Payer: Medicare Other

## 2022-01-24 LAB — ERYTHROPOIETIN: Erythropoietin: 7.5 m[IU]/mL (ref 2.6–18.5)

## 2022-01-25 LAB — CARBON MONOXIDE, BLOOD (PERFORMED AT REF LAB): Carbon Monoxide, Blood: 2.5 % (ref 0.0–3.6)

## 2022-01-28 LAB — BCR-ABL1 FISH
Cells Analyzed: 200
Cells Counted: 200

## 2022-01-30 DIAGNOSIS — I679 Cerebrovascular disease, unspecified: Secondary | ICD-10-CM | POA: Diagnosis not present

## 2022-02-06 ENCOUNTER — Ambulatory Visit: Payer: Medicare Other

## 2022-02-06 LAB — MISC LABCORP TEST (SEND OUT): Labcorp test code: 489555

## 2022-02-11 ENCOUNTER — Encounter: Payer: Self-pay | Admitting: Registered Nurse

## 2022-02-11 ENCOUNTER — Encounter: Payer: Medicare Other | Attending: Physical Medicine & Rehabilitation | Admitting: Registered Nurse

## 2022-02-11 ENCOUNTER — Other Ambulatory Visit (HOSPITAL_COMMUNITY): Payer: Self-pay

## 2022-02-11 VITALS — BP 128/84 | HR 74 | Ht 63.0 in | Wt 230.0 lb

## 2022-02-11 DIAGNOSIS — M7062 Trochanteric bursitis, left hip: Secondary | ICD-10-CM | POA: Insufficient documentation

## 2022-02-11 DIAGNOSIS — M5416 Radiculopathy, lumbar region: Secondary | ICD-10-CM | POA: Insufficient documentation

## 2022-02-11 DIAGNOSIS — G894 Chronic pain syndrome: Secondary | ICD-10-CM | POA: Insufficient documentation

## 2022-02-11 DIAGNOSIS — M542 Cervicalgia: Secondary | ICD-10-CM | POA: Insufficient documentation

## 2022-02-11 DIAGNOSIS — Z79891 Long term (current) use of opiate analgesic: Secondary | ICD-10-CM | POA: Insufficient documentation

## 2022-02-11 DIAGNOSIS — M797 Fibromyalgia: Secondary | ICD-10-CM | POA: Insufficient documentation

## 2022-02-11 DIAGNOSIS — Z5181 Encounter for therapeutic drug level monitoring: Secondary | ICD-10-CM | POA: Diagnosis not present

## 2022-02-11 MED ORDER — HYDROCODONE-ACETAMINOPHEN 10-325 MG PO TABS
1.0000 | ORAL_TABLET | Freq: Three times a day (TID) | ORAL | 0 refills | Status: DC | PRN
  Filled 2022-02-11 – 2022-02-13 (×2): qty 90, 30d supply, fill #0

## 2022-02-11 NOTE — Progress Notes (Signed)
Subjective:    Patient ID: Karina Greer, female    DOB: [birth year], 56 y.o.   MRN: [id number]  HPI: Karina Greer is a 56 y.o. female who returns for follow up appointment for chronic pain and medication refill. She states her  pain is located in her neck and lower back pain radiating into her left lower extremity. She rates her pain 5. Her current exercise regime is walking and performing stretching exercises.  Ms. Ex[employer] Morphine equivalent is 30.00 MME.   Last Oral Swab was Performed on 10/19/2021, it was consistent.     Pain Inventory Average Pain 8 Pain Right Now 5 My pain is constant, sharp, and aching  In the last 24 hours, has pain interfered with the following? General activity 7 Relation with others 8 Enjoyment of life 4 What TIME of day is your pain at its worst? morning , daytime, evening, and night Sleep (in general) Fair  Pain is worse with: walking, bending, sitting, standing, and some activites Pain improves with: rest, heat/ice, therapy/exercise, pacing activities, medication, TENS, and injections Relief from Meds: 8  Family History  Problem Relation Age of Onset   Hypertension Mother    Diabetes Father    Brain cancer Father    Clotting disorder Father        blood clots   Fibromyalgia Sister    Suicidality Sister    Hypertension Other        Cancer, Cerebrovascular disease run on mother side of family   Social History   Socioeconomic History   Marital status: Divorced    Spouse name: Not on file   Number of children: 3   Years of education: HS   Highest education level: 12th grade  Occupational History   Occupation: Merchandiser, retail: UNEMPLOYED    Comment: Disability  Tobacco Use   Smoking status: Never   Smokeless tobacco: Never  Vaping Use   Vaping Use: Never used  Substance and Sexual Activity   Alcohol use: No   Drug use: No   Sexual activity: Not on file  Other Topics Concern   Not on file  Social History Narrative    Patient is right-handed. She avoids caffeine. She has recently been using the treadmill.   Social Determinants of Health   Financial Resource Strain: Not on file  Food Insecurity: Not on file  Transportation Needs: Not on file  Physical Activity: Not on file  Stress: Not on file  Social Connections: Not on file   Past Surgical History:  Procedure Laterality Date   ACHILLES TENDON SURGERY Right 3/16   ANTERIOR CERVICAL DECOMP/DISCECTOMY FUSION N/A 03/11/2016   Procedure: C5-6, C6-7 Anterior Cervical Discectomy and Fusion, Allograft, Plate;  Surgeon: Marybelle Killings, MD;  Location: [place];  Service: Orthopedics;  Laterality: N/A;   CHOLECYSTECTOMY  2014   COLONOSCOPY WITH PROPOFOL N/A 08/25/2017   Procedure: COLONOSCOPY WITH PROPOFOL;  Surgeon: Manya Silvas, MD;  Location: [facility] ENDOSCOPY;  Service: Endoscopy;  Laterality: N/A;   CYSTO WITH HYDRODISTENSION N/A 01/26/2014   Procedure: CYSTOSCOPY/HYDRODISTENSION;  Surgeon: Bernestine Amass, MD;  Location: [facility];  Service: Urology;  Laterality: N/A;   CYSTO/  URETHRAL DILATION/  HYDRODISTENTION/   INSTILLATION THERAPY  07-16-2010//   12-30-2007//   10-27-2006   ELBOW SURGERY Right    EXERCISE TOLERENCE TEST  10-12-2010   NEGATIVE  ADEQUATE ETT/  NO ISCHEMIA OR EVIDENCE HIGH GRADE OBSTRUCTIVE CAD/  NO FURTHER TEST NEEDED  HYSTEROSCOPY W/  NOVASURE ENDOMETRIAL ABLATION  2014   LAPAROSCOPIC OVARIAN CYST BX  2005   AND  URETEROSCOPIC LASER LITHO  STONE EXTRACTION   SHOULDER OPEN ROTATOR CUFF REPAIR Right 2004   TONSILLECTOMY     TRANSTHORACIC ECHOCARDIOGRAM  06-07-2006   normal study/  ef 60-65%   TUBAL LIGATION Bilateral 1995   Past Surgical History:  Procedure Laterality Date   ACHILLES TENDON SURGERY Right 3/16   ANTERIOR CERVICAL DECOMP/DISCECTOMY FUSION N/A 03/11/2016   Procedure: C5-6, C6-7 Anterior Cervical Discectomy and Fusion, Allograft, Plate;  Surgeon: Marybelle Killings, MD;  Location: [place];  Service: Orthopedics;   Laterality: N/A;   CHOLECYSTECTOMY  2014   COLONOSCOPY WITH PROPOFOL N/A 08/25/2017   Procedure: COLONOSCOPY WITH PROPOFOL;  Surgeon: Manya Silvas, MD;  Location: [facility] ENDOSCOPY;  Service: Endoscopy;  Laterality: N/A;   CYSTO WITH HYDRODISTENSION N/A 01/26/2014   Procedure: CYSTOSCOPY/HYDRODISTENSION;  Surgeon: Bernestine Amass, MD;  Location: [facility];  Service: Urology;  Laterality: N/A;   CYSTO/  URETHRAL DILATION/  HYDRODISTENTION/   INSTILLATION THERAPY  07-16-2010//   12-30-2007//   10-27-2006   ELBOW SURGERY Right    EXERCISE TOLERENCE TEST  10-12-2010   NEGATIVE  ADEQUATE ETT/  NO ISCHEMIA OR EVIDENCE HIGH GRADE OBSTRUCTIVE CAD/  NO FURTHER TEST NEEDED   HYSTEROSCOPY W/  [place] ENDOMETRIAL ABLATION  2014   LAPAROSCOPIC OVARIAN CYST BX  2005   AND  URETEROSCOPIC LASER LITHO  STONE EXTRACTION   SHOULDER OPEN ROTATOR CUFF REPAIR Right 2004   TONSILLECTOMY     TRANSTHORACIC ECHOCARDIOGRAM  06-07-2006   normal study/  ef 60-65%   TUBAL LIGATION Bilateral 1995   Past Medical History:  Diagnosis Date   Anxiety    Anxiety disorder    Arthritis    Blepharitis    Chronic low back pain    Chronic pain    Depression    Diabetes mellitus without complication (HCC)    Dyslipidemia    Family history of blood clots    Fibromyalgia    GERD (gastroesophageal reflux disease)    Headache    hx migraines   History of kidney stones    History of panic attacks    History of renal calculi    IBS (irritable bowel syndrome)    Interstitial cystitis    Lupus ([place])    tested positive for antibodies for lupus. dr to do further tests   Menorrhagia    OSA on CPAP    not used cpap for several weeks   Pneumonia    hx   PONV (postoperative nausea and vomiting)    RLS (restless legs syndrome)    Rosacea    Seasonal asthma    SI (sacroiliac) joint dysfunction    SUI (stress urinary incontinence, female)    UTI (lower urinary tract infection)    hx   White matter  abnormality on MRI of brain 02/23/2013   BP 128/84   Pulse 74   Ht '5\' 3"'$  (1.6 m)   Wt 230 lb (104.3 kg)   SpO2 96%   BMI 40.74 kg/m   Opioid Risk Score:   Fall Risk Score:  `1  Depression screen [facility] 2/9     02/11/2022   11:23 AM 01/14/2022    9:30 AM 12/14/2021    1:03 PM 12/04/2021    1:00 PM 11/19/2021    9:24 AM 10/19/2021    9:45 AM 08/23/2021    9:12 AM  Depression screen PHQ 2/9  Decreased Interest 0 0 1 1 0 0 0  Down, Depressed, Hopeless 0 0 1 1 0 0 0  PHQ - 2 Score 0 0 2 2 0 0 0     Review of Systems  Musculoskeletal:  Positive for myalgias.  All other systems reviewed and are negative.     Objective:   Physical Exam Vitals and nursing note reviewed.  Constitutional:      Appearance: Normal appearance.  Cardiovascular:     Rate and Rhythm: Normal rate and regular rhythm.     Pulses: Normal pulses.     Heart sounds: Normal heart sounds.  Pulmonary:     Effort: Pulmonary effort is normal.     Breath sounds: Normal breath sounds.  Musculoskeletal:     Cervical back: Normal range of motion and neck supple.     Comments: Normal Muscle Bulk and Muscle Testing Reveals:  Upper Extremities: Full ROM and Muscle Strength 5/5  Lumbar Paraspinal Tenderness: L-4-L-5 Lower Extremities Right: Full ROM an Muscle  Strength 5/5 Left Lower Extremity Flexion Produces Pain into left hip and left lower extremity  Arises from Chair  with Ease Narrow Based Gait     Skin:    General: Skin is warm and dry.  Neurological:     Mental Status: She is alert and oriented to person, place, and time.  Psychiatric:        Mood and Affect: Mood normal.        Behavior: Behavior normal.         Assessment & Plan:  1. Lumbago/ Lumbar Spondylosis/ Left Lumbar Radiculitis: Continue to Monitor. 05/22203. Refilled: HYDROcodone 10/'325mg'$  one tablet every 8 hours as needed #90.  We will continue the opioid monitoring program, this consists of regular clinic visits, examinations, urine drug  screen, pill counts as well as use of [place] Controlled Substance Reporting system. A 12 month History has been reviewed on the [place] on 02/11/2022. 2. Fibromyalgia. Continue Current exercise Regime. 02/11/2022 3. Anxiety and depression:Psychiatry Following: Counselor Jessica  at the [facility] . Continue Counseling at [facility]. 02/11/2022 4. Migraines: On Maxalt. Neurology Following. 02/11/2022 5. OSA : PCP Following. Continue to Monitor. 02/11/2022 6. Obesity: Dr Ranell Patrick Following: Continue  Healthy Diet Regimen and Continue HEP as Tolerated. 02/11/2022 7. Status Post Cervical Spinal Fusion: C5C6- C-6- C-7 Anterior Cervical Discectomy Fusion and Allograft Plate: Dr. Lorin Mercy Following. 02/11/2022 8. Cervicalgia/ Cervical Radiculitis/ S/P Cervical Spinal Fusion:  Continue to Monitor Dr. Lorin Mercy Following. 01/14/2022 9. 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 02/11/2022. 11.Left Greater Trochanteric Bursitis: . Continue to Alternate Ice and heat Therapy. Continue to Monitor. 02/11/2022. 12. Chronic Bilateral Knee Pain: No Complaints Today. Ortho Following. Continue HEP as Tolerated. Continue to Monitor. 02/11/2022 13. Lateral Epicondylitis of Right Elbow: No complaints today.  Ortho Following. S/P   06/26/2020 Right lateral epicondyle debridement, drilling and repair  By Dr Lorin Mercy. 14. Neuralgia/ Right Facial Droop: Neurology following. Continue to monitor. 02/11/2022. 15. Polyarthralgia: No complaints today. Continue HEP as tolerated. Continue to monitor. 02/11/2022  16. Paresthesia of Right Lower Extremity: Occasionally: Continue current medication regimen. Continue to monitor. 02/11/2022     F/U in 1 month .

## 2022-02-12 ENCOUNTER — Ambulatory Visit (INDEPENDENT_AMBULATORY_CARE_PROVIDER_SITE_OTHER): Payer: Medicare Other

## 2022-02-12 DIAGNOSIS — K219 Gastro-esophageal reflux disease without esophagitis: Secondary | ICD-10-CM | POA: Diagnosis not present

## 2022-02-12 DIAGNOSIS — R1013 Epigastric pain: Secondary | ICD-10-CM | POA: Diagnosis not present

## 2022-02-12 DIAGNOSIS — J301 Allergic rhinitis due to pollen: Secondary | ICD-10-CM

## 2022-02-12 DIAGNOSIS — K580 Irritable bowel syndrome with diarrhea: Secondary | ICD-10-CM | POA: Diagnosis not present

## 2022-02-12 DIAGNOSIS — K760 Fatty (change of) liver, not elsewhere classified: Secondary | ICD-10-CM | POA: Diagnosis not present

## 2022-02-12 DIAGNOSIS — R110 Nausea: Secondary | ICD-10-CM | POA: Diagnosis not present

## 2022-02-13 ENCOUNTER — Other Ambulatory Visit (HOSPITAL_COMMUNITY): Payer: Self-pay

## 2022-02-20 ENCOUNTER — Ambulatory Visit: Payer: Medicare Other

## 2022-02-25 DIAGNOSIS — R5383 Other fatigue: Secondary | ICD-10-CM | POA: Diagnosis not present

## 2022-02-25 DIAGNOSIS — M810 Age-related osteoporosis without current pathological fracture: Secondary | ICD-10-CM | POA: Diagnosis not present

## 2022-02-25 DIAGNOSIS — R635 Abnormal weight gain: Secondary | ICD-10-CM | POA: Diagnosis not present

## 2022-02-25 DIAGNOSIS — R6889 Other general symptoms and signs: Secondary | ICD-10-CM | POA: Diagnosis not present

## 2022-02-25 NOTE — Progress Notes (Unsigned)
Subjective:    Patient ID: Karina Greer, female    DOB: May 03, 1966, 56 y.o.   MRN: [id number]  HPI:  Karina Greer is a 56 y.o. female who presents with fibromyalgia who presents for nutritional counseling regarding her pain and medical conditions.   -She has trouble eating in the morning. Sometimes she does not eat until lunch. She has been trying to do the fasting we discussed last visit.  -Her son is 278 pounds and 6 foot tall. He went to 242 lbs following a similar diet.  -Her dietary goals are to make use of what she eats.  -she has been eating a lot of salads.  -she cannot handle hard cheeses.  -she has been having a problem eating hamburger.  -Last night she made four pieces of chicken and put on some tomato sauce and red peppers, she sprinkled some cheese, added broccoli, olive oil, garlic, butter.  -she is planned to have her HgbA1c and lipid panel drawn by her PCP next week.  -has been having low back pain, MRI ordered and shows stable spondylosis from prior -she feels better when there is no food in her stomach -she got organic oranges from a farm and is going to make her own orange juice -she bought the Bioptemizer's magnesium so will start this soon -she is having a lot of pain in her neck and feels this is from the weather and her surgery -her calf is hurting a lot. She has not had an ultrasound. -blood clots run in her family.   -she bought [employer], Engineering geologist, magnesium breakthrough, Krill oil, Spirulina, Coenzyme Q10 -she saw an endocrinologist and she did a swab test -she went today for bloodwork to check her potassium, cortisol to look for Cushing's Disease -she feels like her hormones are off balance.  -she feels that she has all the symptoms of Cushing's Disease -she was told her rheum panel is normal.  -she was told she has a meniscal tear by ortho    Pain Inventory Average Pain 8 Pain Right Now 6 My pain is constant, stabbing, tingling, and aching  In  the last 24 hours, has pain interfered with the following? General activity 8 Relation with others 8 Enjoyment of life 8 What TIME of day is your pain at its worst? morning , daytime, evening, and night Sleep (in general) Fair  Pain is worse with: walking, bending, sitting, inactivity, standing, unsure, and some activites Pain improves with: rest, heat/ice, therapy/exercise, pacing activities, medication, TENS, and injections Relief from Meds: 6  Family History  Problem Relation Age of Onset   Hypertension Mother    Diabetes Father    Brain cancer Father    Clotting disorder Father        blood clots   Fibromyalgia Sister    Suicidality Sister    Hypertension Other        Cancer, Cerebrovascular disease run on mother side of family   Social History   Socioeconomic History   Marital status: Divorced    Spouse name: Not on file   Number of children: 3   Years of education: HS   Highest education level: 12th grade  Occupational History   Occupation: Merchandiser, retail: UNEMPLOYED    Comment: Disability  Tobacco Use   Smoking status: Never   Smokeless tobacco: Never  Vaping Use   Vaping Use: Never used  Substance and Sexual Activity   Alcohol use: No   Drug use:  No   Sexual activity: Not on file  Other Topics Concern   Not on file  Social History Narrative   Patient is right-handed. She avoids caffeine. She has recently been using the treadmill.   Social Determinants of Health   Financial Resource Strain: Not on file  Food Insecurity: Not on file  Transportation Needs: Not on file  Physical Activity: Not on file  Stress: Not on file  Social Connections: Not on file   Past Surgical History:  Procedure Laterality Date   ACHILLES TENDON SURGERY Right 3/16   ANTERIOR CERVICAL DECOMP/DISCECTOMY FUSION N/A 03/11/2016   Procedure: C5-6, C6-7 Anterior Cervical Discectomy and Fusion, Allograft, Plate;  Surgeon: Marybelle Killings, MD;  Location: [facility];  Service:  Orthopedics;  Laterality: N/A;   CHOLECYSTECTOMY  2014   COLONOSCOPY WITH PROPOFOL N/A 08/25/2017   Procedure: COLONOSCOPY WITH PROPOFOL;  Surgeon: Manya Silvas, MD;  Location: [facility] ENDOSCOPY;  Service: Endoscopy;  Laterality: N/A;   CYSTO WITH HYDRODISTENSION N/A 01/26/2014   Procedure: CYSTOSCOPY/HYDRODISTENSION;  Surgeon: Bernestine Amass, MD;  Location: [facility];  Service: Urology;  Laterality: N/A;   CYSTO/  URETHRAL DILATION/  HYDRODISTENTION/   INSTILLATION THERAPY  07-16-2010//   12-30-2007//   10-27-2006   ELBOW SURGERY Right    EXERCISE TOLERENCE TEST  10-12-2010   NEGATIVE  ADEQUATE ETT/  NO ISCHEMIA OR EVIDENCE HIGH GRADE OBSTRUCTIVE CAD/  NO FURTHER TEST NEEDED   HYSTEROSCOPY W/  NOVASURE ENDOMETRIAL ABLATION  2014   LAPAROSCOPIC OVARIAN CYST BX  2005   AND  URETEROSCOPIC LASER LITHO  STONE EXTRACTION   SHOULDER OPEN ROTATOR CUFF REPAIR Right 2004   TONSILLECTOMY     TRANSTHORACIC ECHOCARDIOGRAM  06-07-2006   normal study/  ef 60-65%   TUBAL LIGATION Bilateral 1995   Past Surgical History:  Procedure Laterality Date   ACHILLES TENDON SURGERY Right 3/16   ANTERIOR CERVICAL DECOMP/DISCECTOMY FUSION N/A 03/11/2016   Procedure: C5-6, C6-7 Anterior Cervical Discectomy and Fusion, Allograft, Plate;  Surgeon: Marybelle Killings, MD;  Location: [place];  Service: Orthopedics;  Laterality: N/A;   CHOLECYSTECTOMY  2014   COLONOSCOPY WITH PROPOFOL N/A 08/25/2017   Procedure: COLONOSCOPY WITH PROPOFOL;  Surgeon: Manya Silvas, MD;  Location: [facility] ENDOSCOPY;  Service: Endoscopy;  Laterality: N/A;   CYSTO WITH HYDRODISTENSION N/A 01/26/2014   Procedure: CYSTOSCOPY/HYDRODISTENSION;  Surgeon: Bernestine Amass, MD;  Location: [facility];  Service: Urology;  Laterality: N/A;   CYSTO/  URETHRAL DILATION/  HYDRODISTENTION/   INSTILLATION THERAPY  07-16-2010//   12-30-2007//   10-27-2006   ELBOW SURGERY Right    EXERCISE TOLERENCE TEST  10-12-2010   NEGATIVE  ADEQUATE  ETT/  NO ISCHEMIA OR EVIDENCE HIGH GRADE OBSTRUCTIVE CAD/  NO FURTHER TEST NEEDED   HYSTEROSCOPY W/  [place] ENDOMETRIAL ABLATION  2014   LAPAROSCOPIC OVARIAN CYST BX  2005   AND  URETEROSCOPIC LASER LITHO  STONE EXTRACTION   SHOULDER OPEN ROTATOR CUFF REPAIR Right 2004   TONSILLECTOMY     TRANSTHORACIC ECHOCARDIOGRAM  06-07-2006   normal study/  ef 60-65%   TUBAL LIGATION Bilateral 1995   Past Medical History:  Diagnosis Date   Anxiety    Anxiety disorder    Arthritis    Blepharitis    Chronic low back pain    Chronic pain    Depression    Diabetes mellitus without complication ([place])    Dyslipidemia    Family history of blood clots  Fibromyalgia    GERD (gastroesophageal reflux disease)    Headache    hx migraines   History of kidney stones    History of panic attacks    History of renal calculi    IBS (irritable bowel syndrome)    Interstitial cystitis    Lupus ([place])    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   There were no vitals taken for this visit.  Opioid Risk Score:   Fall Risk Score:  `1  Depression screen [facility] 2/9     02/11/2022   11:23 AM 01/14/2022    9:30 AM 12/14/2021    1:03 PM 12/04/2021    1:00 PM 11/19/2021    9:24 AM 10/19/2021    9:45 AM 08/23/2021    9:12 AM  Depression screen PHQ 2/9  Decreased Interest 0 0 1 1 0 0 0  Down, Depressed, Hopeless 0 0 1 1 0 0 0  PHQ - 2 Score 0 0 2 2 0 0 0    Review of Systems  Musculoskeletal:  Positive for arthralgias and back pain.       Pain all over the body  All other systems reviewed and are negative.      Objective:   Physical Exam Gen: no distress, normal appearing, BMI 40.92 HEENT: oral mucosa pink and moist, NCAT Cardio: Reg rate Chest: normal effort, normal rate of breathing Abd: soft, non-distended Ext: no edema Psych: pleasant, normal affect Skin: intact Neuro: Alert and oriented Musculoskeletal: Normal gait. Negative Thessaly on the right.     Assessment & Plan:  1. Lumbago/ Lumbar Spondylosis/ Left Lumbar Radiculitis: Continue to Monitor. 09/02/202. Refilled HYDROcodone 10/'325mg'$  one tablet every 8 hours as needed #90. Discussed that MRI results show spondylosis that is stable from prior. Continue using muscle rub. Refilled Lidocaine patches.   -discussed weaning off if she can tolerate.  2. Fibromyalgia. Continue Current exercise Regime. 05/25/2021. Discussed qua therapy.   3. Anxiety and depression:Psychiatry Following: Counselor Jessica  at [facility] . Continue Counseling at [facility]. 05/25/2021  4. Migraines: On Maxalt. Neurology Following. 05/25/2021. Start Bioptemizers magnesium.   5. OSA : PCP Following. Continue to Monitor. 05/25/2021  6. Obesity:   -BMI 40.92, weight 231 lbs.  -continue to avoid bread -continue 2 TB of saurkraut every morning  -recommended to use her krill oil, Vitamin D, [place], magnesium breakthrough  -encouraged dandelion tea  -discussed wormwood/walnut oil cleanse -recommended nattokinase -encouraged endocrinology follow-up -counseled regarding the benefits of intermittent fasting and improving insulin sensitivity. -Educated that current weight is 225 lbs and current BMI is 40.00 -Educated regarding health benefits of weight loss- for pain, general health, chronic disease prevention, immune health, mental health.  -Will monitor weight every visit.  -Continue [place] tea daily. -Discussed the benefits of intermittent fasting. Continue this. Discussed that 30 hour fasts are healthy.  -Discussed foods that can assist in  weight loss: 1) leafy greens- high in fiber and nutrients 2)  dark chocolate- improves metabolism (if prefer sweetened, best to sweeten with honey instead of sugar).  3) cruciferous vegetables- high in fiber and protein 4) full fat yogurt: high in healthy fat, protein, calcium, and probiotics 5) apples- high in a variety of phytochemicals 6) nuts- high in fiber and protein that increase feelings of fullness 7) grapefruit: rich in nutrients, antioxidants, and fiber (not to be taken with anticoagulation) 8) beans- high in protein and fiber 9) salmon- has high quality protein and healthy fats 10) green tea- rich in polyphenols 11) eggs- rich in choline and vitamin D 12) tuna- high protein, boosts metabolism 13) avocado- decreases visceral abdominal fat 14) chicken (pasture raised): high in protein and iron 15) blueberries- reduce abdominal fat and cholesterol 16) whole grains- decreases calories retained during digestion, speeds metabolism 17) chia seeds- curb appetite 18) chilies- increases fat metabolism  -Discussed supplements that can be used:  1) Metatrim '400mg'$  BID 30 minutes before breakfast and dinner  2) Sphaeranthus indicus and Garcinia mangostana (combinations of these and #1 can be found in capsicum and zychrome  3) green coffee bean extract '400mg'$  twice per day or Irvingia (african mango) 150 to '300mg'$  twice per day. Teas: Green tea Black tea Oolong tea White tea [place] of Tea, [place] Continue to avoid added sugar. Continue using honey or monk fruit as sweeteners.   7. Status Post Cervical Spinal Fusion: C5C6- C-6- C-7 Anterior Cervical Discectomy Fusion and Allograft Plate: Dr. Lorin Mercy Following. 05/25/2021  8. Cervicalgia/ Cervical Radiculitis/ S/P Cervical Spinal Fusion:  Continue to Monitor Dr. Lorin Mercy Following. 05/25/2021  9. 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  05/25/2021.  11.Right   Greater Trochanteric Bursitis: . Continue to Alternate Ice and heat Therapy. Continue to Monitor. 05/25/2021.  12. Chronic Bilateral Knee Pain: No Complaints Today. Continue HEP as Tolerated. Continue to Monitor. 05/25/2021  13. Lateral Epicondylitis of Right Elbow: No complaints today.  Ortho Following. S/P   06/26/2020 Right lateral epicondyle debridement, drilling and repair  By Dr Lorin Mercy. 14. Neuralgia/ Right Facial Droop: Neurology following. Continue to monitor. 05/25/2021.  15. Polyarthralgia: No complaints today. Continue HEP as tolerated. Continue to monitor.   16. Nausea: discussed trying an elimination diet avoiding gluten, dairy, and eggs for 4 weeks.   17. Right lower extremity pain: vascular ultrasound ordered to assess for clot.   18. Abdominal cramps -recommended saffron -recommended to follow-up with OB/GYN  19. Rib pain: -recommended trying lidocaine patch  20. Left knee: potential meniscal tear: discusses getting MRI -discussed benefits of infrared light therapy.  -discussed response to steroid shot- was quite painful but benefitted her afterward.

## 2022-02-26 ENCOUNTER — Encounter: Payer: Self-pay | Admitting: Physical Medicine and Rehabilitation

## 2022-02-26 ENCOUNTER — Encounter: Payer: Medicare Other | Attending: Physical Medicine & Rehabilitation | Admitting: Physical Medicine and Rehabilitation

## 2022-02-26 ENCOUNTER — Ambulatory Visit (INDEPENDENT_AMBULATORY_CARE_PROVIDER_SITE_OTHER): Payer: Medicare Other

## 2022-02-26 VITALS — BP 144/81 | HR 72 | Ht 63.0 in | Wt 231.0 lb

## 2022-02-26 DIAGNOSIS — M7061 Trochanteric bursitis, right hip: Secondary | ICD-10-CM | POA: Diagnosis not present

## 2022-02-26 DIAGNOSIS — M5412 Radiculopathy, cervical region: Secondary | ICD-10-CM | POA: Diagnosis not present

## 2022-02-26 DIAGNOSIS — R4189 Other symptoms and signs involving cognitive functions and awareness: Secondary | ICD-10-CM | POA: Insufficient documentation

## 2022-02-26 DIAGNOSIS — M797 Fibromyalgia: Secondary | ICD-10-CM | POA: Insufficient documentation

## 2022-02-26 DIAGNOSIS — M5416 Radiculopathy, lumbar region: Secondary | ICD-10-CM | POA: Diagnosis not present

## 2022-02-26 DIAGNOSIS — N3010 Interstitial cystitis (chronic) without hematuria: Secondary | ICD-10-CM | POA: Diagnosis not present

## 2022-02-26 DIAGNOSIS — Z79891 Long term (current) use of opiate analgesic: Secondary | ICD-10-CM | POA: Diagnosis not present

## 2022-02-26 DIAGNOSIS — M542 Cervicalgia: Secondary | ICD-10-CM | POA: Diagnosis not present

## 2022-02-26 DIAGNOSIS — M7062 Trochanteric bursitis, left hip: Secondary | ICD-10-CM | POA: Insufficient documentation

## 2022-02-26 DIAGNOSIS — Z5181 Encounter for therapeutic drug level monitoring: Secondary | ICD-10-CM | POA: Insufficient documentation

## 2022-02-26 DIAGNOSIS — J301 Allergic rhinitis due to pollen: Secondary | ICD-10-CM | POA: Diagnosis not present

## 2022-02-26 DIAGNOSIS — G894 Chronic pain syndrome: Secondary | ICD-10-CM | POA: Diagnosis not present

## 2022-02-26 DIAGNOSIS — R300 Dysuria: Secondary | ICD-10-CM | POA: Diagnosis not present

## 2022-02-26 NOTE — Patient Instructions (Signed)
-  discussed mechanism of action of low dose naltrexone as an opioid receptor antagonist which stimulates your body's production of its own natural endogenous opioids, helping to decrease pain. Discussed that it can also decrease T cell response and thus be helpful in decreasing inflammation, and symptoms of brain fog, fatigue, anxiety, depression, and allergies. Discussed that this medication needs to be compounded at a compounding pharmacy and can more expensive. Discussed that I usually start at '1mg'$  and if this is not providing enough relief then I titrate upward on a monthly basis.

## 2022-02-26 NOTE — Addendum Note (Signed)
Addended by: Izora Ribas on: 02/26/2022 10:05 AM   Modules accepted: Level of Service

## 2022-02-27 ENCOUNTER — Encounter (HOSPITAL_BASED_OUTPATIENT_CLINIC_OR_DEPARTMENT_OTHER): Payer: Medicare Other | Admitting: Physical Medicine and Rehabilitation

## 2022-02-27 ENCOUNTER — Ambulatory Visit: Payer: Medicare Other

## 2022-02-27 DIAGNOSIS — N3010 Interstitial cystitis (chronic) without hematuria: Secondary | ICD-10-CM

## 2022-02-27 LAB — URINALYSIS, ROUTINE W REFLEX MICROSCOPIC
Bilirubin, UA: NEGATIVE
Glucose, UA: NEGATIVE
Ketones, UA: NEGATIVE
Leukocytes,UA: NEGATIVE
Nitrite, UA: NEGATIVE
Protein,UA: NEGATIVE
RBC, UA: NEGATIVE
Specific Gravity, UA: 1.027 (ref 1.005–1.030)
Urobilinogen, Ur: 0.2 mg/dL (ref 0.2–1.0)
pH, UA: 5.5 (ref 5.0–7.5)

## 2022-02-28 LAB — URINE CULTURE

## 2022-02-28 NOTE — Progress Notes (Signed)
Subjective:    Patient ID: Karina Greer, female    DOB: November 18, 1965, 56 y.o.   MRN: [id number]  HPI:  An audio/video tele-health visit is felt to be the most appropriate encounter for this patient at this time. This is a follow up tele-visit via phone. The patient is at home. MD is at office. Prior to scheduling this appointment, our staff discussed the limitations of evaluation and management by telemedicine and the availability of in-person appointments. The patient expressed understanding and agreed to proceed.   Karina Greer is a 56 y.o. female who presents for follow-up of fibromyalgia and interstitial cystitis.   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 Sometimes she does not eat until lunch. She has been trying to do the fasting we discussed last visit.  -Her son is 278 pounds and 6 foot tall. He went to 242 lbs following a similar diet.  -Her dietary goals are to make use of what she eats.  -she has been eating a lot of salads.  -she cannot handle hard cheeses.  -she has been having a problem eating hamburger.  -Last night she made four pieces of chicken and put on some tomato sauce and red peppers, she sprinkled some cheese, added broccoli, olive oil, garlic, butter.  -she is planned to have her HgbA1c and lipid panel  drawn by her PCP next week.  -has been having low back pain, MRI ordered and shows stable spondylosis from prior -she feels better when there is no food in her stomach -she got organic oranges from a farm and is going to make her own orange juice -she bought the Bioptemizer's magnesium so will start this soon -she is having a lot of pain in her neck and feels this is from the weather and her surgery -her calf is hurting a lot. She has not had an ultrasound. -blood clots run in her family.   -she bought [employer], Engineering geologist, magnesium breakthrough, Krill oil, Spirulina, Coenzyme Q10 -she saw an endocrinologist and she did a swab test -she went today for bloodwork to check her potassium, cortisol to look for Cushing's Disease -she feels like her hormones are off balance.  -she feels that she has all the symptoms of Cushing's Disease -she was told her rheum panel is normal.  -she was told she has a meniscal tear by ortho    Pain Inventory Average Pain 8 Pain Right Now 5 My pain is constant, sharp, and stabbing  In the last 24 hours, has pain interfered with the following? General activity 5 Relation with others 5 Enjoyment of life 3 What TIME of day is your pain at its worst? morning , daytime, evening, and night Sleep (in general) Fair  Pain is worse with: walking, bending, sitting, inactivity, standing, unsure,  and some activites Pain improves with: rest, heat/ice, therapy/exercise, pacing activities, medication, TENS, and injections Relief from Meds: 7  Family History  Problem Relation Age of Onset   Hypertension Mother    Diabetes Father    Brain cancer Father    Clotting disorder Father        blood clots   Fibromyalgia Sister    Suicidality Sister    Hypertension Other        Cancer, Cerebrovascular disease run on mother side of family   Social History   Socioeconomic History   Marital status: Divorced    Spouse name: Not on file   Number of children: 3   Years of  education: HS   Highest education level: 12th grade  Occupational History   Occupation: Merchandiser, retail: UNEMPLOYED    Comment: Disability  Tobacco Use   Smoking status: Never   Smokeless tobacco: Never  Vaping Use   Vaping Use: Never used  Substance and Sexual Activity   Alcohol use: No   Drug use: No   Sexual activity: Not on file  Other Topics Concern   Not on file  Social History Narrative   Patient is right-handed. She avoids caffeine. She has recently been using the treadmill.   Social Determinants of Health   Financial Resource Strain: Not on file  Food Insecurity: Not on file  Transportation Needs: Not on file  Physical Activity: Not on file  Stress: Not on file  Social Connections: Not on file   Past Surgical History:  Procedure Laterality Date   ACHILLES TENDON SURGERY Right 3/16   ANTERIOR CERVICAL DECOMP/DISCECTOMY FUSION N/A 03/11/2016   Procedure: C5-6, C6-7 Anterior Cervical Discectomy and Fusion, Allograft, Plate;  Surgeon: Marybelle Killings, MD;  Location: [facility];  Service: Orthopedics;  Laterality: N/A;   CHOLECYSTECTOMY  2014   COLONOSCOPY WITH PROPOFOL N/A 08/25/2017   Procedure: COLONOSCOPY WITH PROPOFOL;  Surgeon: Manya Silvas, MD;  Location: [facility] ENDOSCOPY;  Service: Endoscopy;  Laterality: N/A;   CYSTO WITH HYDRODISTENSION N/A 01/26/2014   Procedure: CYSTOSCOPY/HYDRODISTENSION;  Surgeon: Bernestine Amass, MD;  Location: [facility];  Service: Urology;  Laterality: N/A;   CYSTO/  URETHRAL DILATION/  HYDRODISTENTION/   INSTILLATION THERAPY  07-16-2010//   12-30-2007//   10-27-2006   ELBOW SURGERY Right    EXERCISE TOLERENCE TEST  10-12-2010   NEGATIVE  ADEQUATE ETT/  NO ISCHEMIA OR EVIDENCE HIGH GRADE OBSTRUCTIVE CAD/  NO FURTHER TEST NEEDED   HYSTEROSCOPY W/  NOVASURE ENDOMETRIAL ABLATION  2014   LAPAROSCOPIC OVARIAN CYST BX  2005   AND  URETEROSCOPIC LASER LITHO  STONE EXTRACTION   SHOULDER OPEN ROTATOR CUFF REPAIR Right 2004    TONSILLECTOMY     TRANSTHORACIC ECHOCARDIOGRAM  06-07-2006   normal study/  ef 60-65%   TUBAL LIGATION Bilateral 1995   Past Surgical History:  Procedure Laterality Date   ACHILLES TENDON SURGERY Right 3/16   ANTERIOR CERVICAL DECOMP/DISCECTOMY FUSION N/A 03/11/2016   Procedure: C5-6, C6-7 Anterior Cervical Discectomy and Fusion, Allograft, Plate;  Surgeon: Marybelle Killings, MD;  Location: [facility];  Service: Orthopedics;  Laterality: N/A;   CHOLECYSTECTOMY  2014   COLONOSCOPY WITH PROPOFOL N/A 08/25/2017   Procedure: COLONOSCOPY WITH PROPOFOL;  Surgeon: Manya Silvas, MD;  Location: [facility] ENDOSCOPY;  Service: Endoscopy;  Laterality: N/A;   CYSTO WITH HYDRODISTENSION N/A 01/26/2014   Procedure: CYSTOSCOPY/HYDRODISTENSION;  Surgeon: Bernestine Amass, MD;  Location: [facility];  Service: Urology;  Laterality: N/A;   CYSTO/  URETHRAL DILATION/  HYDRODISTENTION/   INSTILLATION THERAPY  07-16-2010//   12-30-2007//   10-27-2006   ELBOW SURGERY Right    EXERCISE TOLERENCE TEST  10-12-2010   NEGATIVE  ADEQUATE ETT/  NO ISCHEMIA OR EVIDENCE HIGH GRADE OBSTRUCTIVE CAD/  NO FURTHER TEST NEEDED   HYSTEROSCOPY W/  [place] ENDOMETRIAL ABLATION  2014   LAPAROSCOPIC OVARIAN CYST BX  2005   AND  URETEROSCOPIC LASER LITHO  STONE EXTRACTION   SHOULDER OPEN ROTATOR CUFF REPAIR Right 2004   TONSILLECTOMY     TRANSTHORACIC ECHOCARDIOGRAM  06-07-2006   normal study/  ef 60-65%   TUBAL LIGATION Bilateral 1995   Past Medical History:  Diagnosis Date   Anxiety    Anxiety disorder    Arthritis    Blepharitis    Chronic low back pain    Chronic pain    Depression    Diabetes mellitus without complication (HCC)    Dyslipidemia    Family history of blood clots    Fibromyalgia    GERD (gastroesophageal reflux disease)    Headache    hx migraines   History of kidney stones    History of panic attacks    History of renal calculi    IBS (irritable bowel syndrome)    Interstitial cystitis     Lupus ([place])    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   There were no vitals taken for this visit.  Opioid Risk Score:   Fall Risk Score:  `1  Depression screen [facility] 2/9     02/26/2022    9:25 AM 02/11/2022   11:23 AM 01/14/2022    9:30 AM 12/14/2021    1:03 PM 12/04/2021    1:00 PM 11/19/2021    9:24 AM 10/19/2021    9:45 AM  Depression screen PHQ 2/9  Decreased Interest 0 0 0 1 1 0 0  Down, Depressed, Hopeless 0 0 0 1 1 0 0  PHQ - 2 Score 0 0 0 2 2 0 0    Review of Systems  Musculoskeletal:  Positive for arthralgias and back pain.       Pain all over the body  All other systems reviewed and are negative.      Objective:   Physical Exam Not performed    Assessment & Plan:  1. Lumbago/ Lumbar Spondylosis/ Left Lumbar Radiculitis: Continue to Monitor. 09/02/202. Refilled HYDROcodone 10/'325mg'$  one tablet every 8 hours as needed #90. Discussed that MRI results show spondylosis that is stable from prior. Continue using muscle rub. Refilled Lidocaine patches.   -discussed weaning off if she can tolerate.  -discussed mechanism of action of low dose naltrexone as an opioid receptor antagonist which stimulates your body's production of its own natural endogenous opioids, helping to decrease pain. Discussed that it can also decrease T cell response and thus be helpful in decreasing inflammation, and symptoms of brain fog, fatigue, anxiety, depression,  and allergies. Discussed that this medication needs to be compounded at a compounding pharmacy and can more expensive. Discussed that I usually  start at '1mg'$  and if this is not providing enough relief then I titrate upward on a monthly basis.    -UDS today.   2. Fibromyalgia. Continue Current exercise Regime. 05/25/2021. Discussed qua therapy.   3. Anxiety and depression:Psychiatry Following: Counselor Jessica  at the [facility] . Continue Counseling at [facility]. 05/25/2021  4. Migraines: On Maxalt. Neurology Following. 05/25/2021. Start Bioptemizers magnesium.   5. OSA : PCP Following. Continue to Monitor. 05/25/2021  6. Obesity:   -BMI 40.92, weight 231 lbs.  -continue to avoid bread -continue 2 TB of saurkraut every morning  -recommended to use her krill oil, Vitamin D, [place], magnesium breakthrough  -encouraged dandelion tea  -discussed wormwood/walnut oil cleanse -recommended nattokinase -encouraged endocrinology follow-up -counseled regarding the benefits of intermittent fasting and improving insulin sensitivity. -Educated that current weight is 225 lbs and current BMI is 40.00 -Educated regarding health benefits of weight loss- for pain, general health, chronic disease prevention, immune health, mental health.  -Will monitor weight every visit.  -Continue Ro[place]ois tea daily. -Discussed the benefits of intermittent fasting. Continue this. Discussed that 30 hour fasts are healthy.  -Discussed foods that can assist in weight loss: 1) leafy greens- high in fiber and nutrients 2) dark chocolate- improves metabolism (if prefer sweetened, best to sweeten with honey instead of sugar).  3) cruciferous vegetables- high in fiber and protein 4) full fat yogurt: high in healthy fat, protein, calcium, and probiotics 5) apples- high in a variety of phytochemicals 6) nuts- high in fiber and protein that increase feelings of fullness 7) grapefruit: rich in nutrients, antioxidants, and fiber (not to be taken with anticoagulation) 8) beans- high in protein and fiber 9) salmon- has high quality protein and healthy fats 10)  green tea- rich in polyphenols 11) eggs- rich in choline and vitamin D 12) tuna- high protein, boosts metabolism 13) avocado- decreases visceral abdominal fat 14) chicken (pasture raised): high in protein and iron 15) blueberries- reduce abdominal fat and cholesterol 16) whole grains- decreases calories retained during digestion, speeds metabolism 17) chia seeds- curb appetite 18) chilies- increases fat metabolism  -Discussed supplements that can be used:  1) Metatrim '400mg'$  BID 30 minutes before breakfast and dinner  2) Sphaeranthus indicus and Garcinia mangostana (combinations of these and #1 can be found in capsicum and zychrome  3) green coffee bean extract '400mg'$  twice per day or Irvingia (african mango) 150 to '300mg'$  twice per day. Teas: Green tea Black tea Oolong tea White tea [place] [employer] Tea, [place] Continue to avoid added sugar. Continue using honey or monk fruit as sweeteners.   7. Status Post Cervical Spinal Fusion: C5C6- C-6- C-7 Anterior Cervical Discectomy Fusion and Allograft Plate: Dr. Lorin Mercy Following. 05/25/2021  8. Cervicalgia/ Cervical Radiculitis/ S/P Cervical Spinal Fusion:  Continue to Monitor Dr. Lorin Mercy Following. 05/25/2021  9. 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 05/25/2021.  11.Right   Greater Trochanteric Bursitis: . Continue to Alternate Ice and heat Therapy. Continue to Monitor. 05/25/2021.  12. Chronic Bilateral Knee Pain: No Complaints Today. Continue HEP as Tolerated. Continue to Monitor. 05/25/2021  13. Lateral Epicondylitis of Right Elbow: No complaints today.  Ortho Following. S/P   06/26/2020 Right lateral epicondyle debridement, drilling and repair  By Dr Lorin Mercy. 14. Neuralgia/ Right Facial Droop: Neurology following. Continue to monitor.  05/25/2021.  15. Polyarthralgia: No complaints today. Continue HEP as tolerated. Continue to monitor.   16. Nausea:  discussed trying an elimination diet avoiding gluten, dairy, and eggs for 4 weeks.   17. Right lower extremity pain: vascular ultrasound ordered to assess for clot.   18. Abdominal cramps -recommended saffron -recommended to follow-up with OB/GYN  19. Rib pain: -recommended trying lidocaine patch  20. Left knee: potential meniscal tear: discusses getting MRI -discussed benefits of infrared light therapy.  -discussed response to steroid shot- was quite painful but benefitted her afterward.   21. Brain Fog -discussed speech therapy -discussed her full 6 hour neuropsych testing at [facility] and was told overall she did well but there were some domains in which she stayed low -continue CPAP -continue to stay well hydrated.  -discussed low dose naltrexone  22. Interstitial cystitis -discussed that UA is normal, discussed medication treatment options for interstitial cystitis and sent her a link with more information. -discussed pentosan polysulfate sodium as a treatment to help restore the inner surface of the bladder.    6 minutes spent in discussion of her normal UA, discussed medication treatment options for her interstitial cystitis, discussed pentosan polysulfate sodium as a treatment to help restore the inner surface of the bladder

## 2022-03-01 ENCOUNTER — Telehealth: Payer: Self-pay | Admitting: *Deleted

## 2022-03-01 LAB — TOXASSURE SELECT,+ANTIDEPR,UR

## 2022-03-01 NOTE — Telephone Encounter (Signed)
Urine drug screen for this encounter is consistent for prescribed medication 

## 2022-03-04 DIAGNOSIS — J019 Acute sinusitis, unspecified: Secondary | ICD-10-CM | POA: Diagnosis not present

## 2022-03-06 ENCOUNTER — Ambulatory Visit: Payer: Medicare Other

## 2022-03-12 ENCOUNTER — Ambulatory Visit (INDEPENDENT_AMBULATORY_CARE_PROVIDER_SITE_OTHER): Payer: Medicare Other

## 2022-03-12 DIAGNOSIS — J301 Allergic rhinitis due to pollen: Secondary | ICD-10-CM | POA: Diagnosis not present

## 2022-03-13 ENCOUNTER — Encounter: Payer: Self-pay | Admitting: Registered Nurse

## 2022-03-13 ENCOUNTER — Encounter (HOSPITAL_BASED_OUTPATIENT_CLINIC_OR_DEPARTMENT_OTHER): Payer: Medicare Other | Admitting: Registered Nurse

## 2022-03-13 ENCOUNTER — Other Ambulatory Visit (HOSPITAL_COMMUNITY): Payer: Self-pay

## 2022-03-13 VITALS — BP 136/84 | HR 70 | Ht 63.0 in | Wt 229.2 lb

## 2022-03-13 DIAGNOSIS — Z79891 Long term (current) use of opiate analgesic: Secondary | ICD-10-CM

## 2022-03-13 DIAGNOSIS — M5412 Radiculopathy, cervical region: Secondary | ICD-10-CM

## 2022-03-13 DIAGNOSIS — M7061 Trochanteric bursitis, right hip: Secondary | ICD-10-CM

## 2022-03-13 DIAGNOSIS — G894 Chronic pain syndrome: Secondary | ICD-10-CM | POA: Diagnosis not present

## 2022-03-13 DIAGNOSIS — M5416 Radiculopathy, lumbar region: Secondary | ICD-10-CM | POA: Diagnosis not present

## 2022-03-13 DIAGNOSIS — Z5181 Encounter for therapeutic drug level monitoring: Secondary | ICD-10-CM

## 2022-03-13 DIAGNOSIS — M797 Fibromyalgia: Secondary | ICD-10-CM | POA: Diagnosis not present

## 2022-03-13 DIAGNOSIS — R4189 Other symptoms and signs involving cognitive functions and awareness: Secondary | ICD-10-CM | POA: Diagnosis not present

## 2022-03-13 DIAGNOSIS — M542 Cervicalgia: Secondary | ICD-10-CM | POA: Diagnosis not present

## 2022-03-13 DIAGNOSIS — M7062 Trochanteric bursitis, left hip: Secondary | ICD-10-CM

## 2022-03-13 DIAGNOSIS — R300 Dysuria: Secondary | ICD-10-CM | POA: Diagnosis not present

## 2022-03-13 MED ORDER — HYDROCODONE-ACETAMINOPHEN 10-325 MG PO TABS
1.0000 | ORAL_TABLET | Freq: Three times a day (TID) | ORAL | 0 refills | Status: DC | PRN
  Filled 2022-03-13 (×2): qty 90, 30d supply, fill #0

## 2022-03-13 NOTE — Progress Notes (Signed)
Subjective:    Patient ID: Karina Greer, female    DOB: 19-Oct-1965, 56 y.o.   MRN: [id number]  HPI: Karina Greer is a 56 y.o. female who returns for follow up appointment for chronic pain and medication refill. She states her pain is located in her neck radiating into her right shoulder, lower back pain radiating into her left hip pain and right hip pain. She rates her pain 4. Her current exercise regime is walking and performing stretching exercises.  [name] Morphine equivalent is 30.00 MME.   Last UDS was Performed on 02/26/2022, it was consistent.     Pain Inventory Average Pain 7 Pain Right Now 4 My pain is constant, sharp, stabbing, and aching  In the last 24 hours, has pain interfered with the following? General activity 6 Relation with others 4 Enjoyment of life 6 What TIME of day is your pain at its worst? morning , daytime, evening, and night Sleep (in general) Fair  Pain is worse with: walking, bending, sitting, inactivity, standing, and some activites Pain improves with: rest, heat/ice, therapy/exercise, pacing activities, medication, TENS, and injections Relief from Meds: 6  Family History  Problem Relation Age of Onset   Hypertension Mother    Diabetes Father    Brain cancer Father    Clotting disorder Father        blood clots   Fibromyalgia Sister    Suicidality Sister    Hypertension Other        Cancer, Cerebrovascular disease run on mother side of family   Social History   Socioeconomic History   Marital status: Divorced    Spouse name: Not on file   Number of children: 3   Years of education: HS   Highest education level: 12th grade  Occupational History   Occupation: Merchandiser, retail: UNEMPLOYED    Comment: Disability  Tobacco Use   Smoking status: Never   Smokeless tobacco: Never  Vaping Use   Vaping Use: Never used  Substance and Sexual Activity   Alcohol use: No   Drug use: No   Sexual activity: Not on file  Other  Topics Concern   Not on file  Social History Narrative   Patient is right-handed. She avoids caffeine. She has recently been using the treadmill.   Social Determinants of Health   Financial Resource Strain: Not on file  Food Insecurity: Not on file  Transportation Needs: Not on file  Physical Activity: Not on file  Stress: Not on file  Social Connections: Not on file   Past Surgical History:  Procedure Laterality Date   ACHILLES TENDON SURGERY Right 3/16   ANTERIOR CERVICAL DECOMP/DISCECTOMY FUSION N/A 03/11/2016   Procedure: C5-6, C6-7 Anterior Cervical Discectomy and Fusion, Allograft, Plate;  Surgeon: Marybelle Killings, MD;  Location: [facility];  Service: Orthopedics;  Laterality: N/A;   CHOLECYSTECTOMY  2014   COLONOSCOPY WITH PROPOFOL N/A 08/25/2017   Procedure: COLONOSCOPY WITH PROPOFOL;  Surgeon: Manya Silvas, MD;  Location: [facility] ENDOSCOPY;  Service: Endoscopy;  Laterality: N/A;   CYSTO WITH HYDRODISTENSION N/A 01/26/2014   Procedure: CYSTOSCOPY/HYDRODISTENSION;  Surgeon: Bernestine Amass, MD;  Location: [facility];  Service: Urology;  Laterality: N/A;   CYSTO/  URETHRAL DILATION/  HYDRODISTENTION/   INSTILLATION THERAPY  07-16-2010//   12-30-2007//   10-27-2006   ELBOW SURGERY Right    EXERCISE TOLERENCE TEST  10-12-2010   NEGATIVE  ADEQUATE ETT/  NO ISCHEMIA OR EVIDENCE HIGH  GRADE OBSTRUCTIVE CAD/  NO FURTHER TEST NEEDED   HYSTEROSCOPY W/  [place] ENDOMETRIAL ABLATION  2014   LAPAROSCOPIC OVARIAN CYST BX  2005   AND  URETEROSCOPIC LASER LITHO  STONE EXTRACTION   SHOULDER OPEN ROTATOR CUFF REPAIR Right 2004   TONSILLECTOMY     TRANSTHORACIC ECHOCARDIOGRAM  06-07-2006   normal study/  ef 60-65%   TUBAL LIGATION Bilateral 1995   Past Surgical History:  Procedure Laterality Date   ACHILLES TENDON SURGERY Right 3/16   ANTERIOR CERVICAL DECOMP/DISCECTOMY FUSION N/A 03/11/2016   Procedure: C5-6, C6-7 Anterior Cervical Discectomy and Fusion, Allograft, Plate;  Surgeon:  Marybelle Killings, MD;  Location: [place];  Service: Orthopedics;  Laterality: N/A;   CHOLECYSTECTOMY  2014   COLONOSCOPY WITH PROPOFOL N/A 08/25/2017   Procedure: COLONOSCOPY WITH PROPOFOL;  Surgeon: Manya Silvas, MD;  Location: [facility] ENDOSCOPY;  Service: Endoscopy;  Laterality: N/A;   CYSTO WITH HYDRODISTENSION N/A 01/26/2014   Procedure: CYSTOSCOPY/HYDRODISTENSION;  Surgeon: Bernestine Amass, MD;  Location: [facility];  Service: Urology;  Laterality: N/A;   CYSTO/  URETHRAL DILATION/  HYDRODISTENTION/   INSTILLATION THERAPY  07-16-2010//   12-30-2007//   10-27-2006   ELBOW SURGERY Right    EXERCISE TOLERENCE TEST  10-12-2010   NEGATIVE  ADEQUATE ETT/  NO ISCHEMIA OR EVIDENCE HIGH GRADE OBSTRUCTIVE CAD/  NO FURTHER TEST NEEDED   HYSTEROSCOPY W/  [place] ENDOMETRIAL ABLATION  2014   LAPAROSCOPIC OVARIAN CYST BX  2005   AND  URETEROSCOPIC LASER LITHO  STONE EXTRACTION   SHOULDER OPEN ROTATOR CUFF REPAIR Right 2004   TONSILLECTOMY     TRANSTHORACIC ECHOCARDIOGRAM  06-07-2006   normal study/  ef 60-65%   TUBAL LIGATION Bilateral 1995   Past Medical History:  Diagnosis Date   Anxiety    Anxiety disorder    Arthritis    Blepharitis    Chronic low back pain    Chronic pain    Depression    Diabetes mellitus without complication (HCC)    Dyslipidemia    Family history of blood clots    Fibromyalgia    GERD (gastroesophageal reflux disease)    Headache    hx migraines   History of kidney stones    History of panic attacks    History of renal calculi    IBS (irritable bowel syndrome)    Interstitial cystitis    Lupus ([place])    tested positive for antibodies for lupus. dr to do further tests   Menorrhagia    OSA on CPAP    not used cpap for several weeks   Pneumonia    hx   PONV (postoperative nausea and vomiting)    RLS (restless legs syndrome)    Rosacea    Seasonal asthma    SI (sacroiliac) joint dysfunction    SUI (stress urinary incontinence, female)    UTI  (lower urinary tract infection)    hx   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 02/23/2013   BP 136/84   Pulse 70   Ht '5\' 3"'$  (1.6 m)   Wt 229 lb 3.2 oz (104 kg)   SpO2 95%   BMI 40.60 kg/m   Opioid Risk Score:   Fall Risk Score:  `1  Depression screen [facility] 2/9     03/13/2022    9:52 AM 02/26/2022    9:25 AM 02/11/2022   11:23 AM 01/14/2022    9:30 AM 12/14/2021    1:03 PM 12/04/2021  1:00 PM 11/19/2021    9:24 AM  Depression screen PHQ 2/9  Decreased Interest 0 0 0 0 1 1 0  Down, Depressed, Hopeless 0 0 0 0 1 1 0  PHQ - 2 Score 0 0 0 0 2 2 0     Review of Systems  Constitutional: Negative.   HENT: Negative.    Eyes: Negative.   Respiratory: Negative.    Cardiovascular: Negative.   Gastrointestinal: Negative.   Endocrine: Negative.   Genitourinary: Negative.   Musculoskeletal:  Positive for back pain.  Skin: Negative.   Allergic/Immunologic: Negative.   Neurological: Negative.   Hematological: Negative.   Psychiatric/Behavioral: Negative.        Objective:   Physical Exam Vitals and nursing note reviewed.  Constitutional:      Appearance: Normal appearance.  Neck:     Comments: Cervical Paraspinal Tenderness: C-4- C-6  Cardiovascular:     Rate and Rhythm: Normal rate and regular rhythm.     Pulses: Normal pulses.     Heart sounds: Normal heart sounds.  Pulmonary:     Effort: Pulmonary effort is normal.     Breath sounds: Normal breath sounds.  Musculoskeletal:     Cervical back: Normal range of motion and neck supple.     Comments: Normal Muscle Bulk and Muscle Testing Reveals:  Upper Extremities: Full ROM and Muscle Strength 5/5 Right AC Joint Tenderness Lumbar Paraspinal Tenderness: L-3-L-5 Lower Extremities: Full ROM and Muscle Strength 5/5 Arises from  Chair slowly  Antalgic Gait     Skin:    General: Skin is warm and dry.  Neurological:     Mental Status: She is alert and oriented to person, place, and time.  Psychiatric:        Mood and  Affect: Mood normal.        Behavior: Behavior normal.         Assessment & Plan:  1. Lumbago/ Lumbar Spondylosis/ Left Lumbar Radiculitis: Continue to Monitor. 06/21203. Refilled: HYDROcodone 10/'325mg'$  one tablet every 8 hours as needed #90.  We will continue the opioid monitoring program, this consists of regular clinic visits, examinations, urine drug screen, pill counts as well as use of [place] Controlled Substance Reporting system. A 12 month History has been reviewed on the [place] on 03/13/2022. 2. Fibromyalgia. Continue Current exercise Regime. 03/13/2022 3. Anxiety and depression:Psychiatry Following: Counselor Jessica  at the [facility] . Continue Counseling at [facility]. 03/13/2022 4. Migraines: On Maxalt. Neurology Following. 03/13/2022 5. OSA : PCP Following. Continue to Monitor. 03/13/2022 6. Obesity: Dr Ranell Patrick Following: Continue  Healthy Diet Regimen and Continue HEP as Tolerated. 03/13/2022 7. Status Post Cervical Spinal Fusion: C5C6- C-6- C-7 Anterior Cervical Discectomy Fusion and Allograft Plate: Dr. Lorin Mercy Following. 03/13/2022 8. Cervicalgia/ Cervical Radiculitis/ S/P Cervical Spinal Fusion:  Continue to Monitor Dr. Lorin Mercy Following. 03/13/2022 9. Muscle Spasm: Continue Tizanidine as needed.03/13/2022 10.Hereditary and Idiopathic Peripheral Neuropathy Continue with Tens Unit. 03/13/2022. 11.Left Greater Trochanteric Bursitis: . Continue to Alternate Ice and heat Therapy. Continue to Monitor. 03/13/2022. 12. Chronic Bilateral Knee Pain: No Complaints Today. Ortho Following. Continue HEP as Tolerated. Continue to Monitor. 03/13/2022 13. Lateral Epicondylitis of Right Elbow: No complaints today.  Ortho Following. S/P   06/26/2020 Right lateral epicondyle debridement, drilling and repair  By Dr Lorin Mercy. 14. Neuralgia/ Right Facial Droop: Neurology following. Continue to monitor. 03/13/2022. 15. Polyarthralgia: No  complaints today. Continue HEP as tolerated. Continue to monitor. 03/13/2022  16. Paresthesia of Right Lower Extremity: Occasionally: Continue current medication regimen. Continue to monitor. 03/13/2022     F/U in 1 month .

## 2022-03-20 ENCOUNTER — Ambulatory Visit: Payer: Medicare Other

## 2022-03-25 ENCOUNTER — Ambulatory Visit (INDEPENDENT_AMBULATORY_CARE_PROVIDER_SITE_OTHER): Payer: Medicare Other

## 2022-03-25 DIAGNOSIS — J301 Allergic rhinitis due to pollen: Secondary | ICD-10-CM | POA: Diagnosis not present

## 2022-04-03 ENCOUNTER — Ambulatory Visit: Payer: Medicare Other | Admitting: Nurse Practitioner

## 2022-04-03 ENCOUNTER — Ambulatory Visit: Payer: Medicare Other

## 2022-04-09 ENCOUNTER — Ambulatory Visit: Payer: Medicare Other

## 2022-04-10 ENCOUNTER — Other Ambulatory Visit (HOSPITAL_COMMUNITY): Payer: Self-pay

## 2022-04-10 ENCOUNTER — Encounter: Payer: Medicare Other | Attending: Physical Medicine & Rehabilitation | Admitting: Registered Nurse

## 2022-04-10 ENCOUNTER — Ambulatory Visit (INDEPENDENT_AMBULATORY_CARE_PROVIDER_SITE_OTHER): Payer: Medicare Other

## 2022-04-10 ENCOUNTER — Encounter: Payer: Self-pay | Admitting: Registered Nurse

## 2022-04-10 VITALS — BP 138/81 | HR 64 | Temp 97.7°F | Ht 63.0 in | Wt 227.0 lb

## 2022-04-10 DIAGNOSIS — Z79891 Long term (current) use of opiate analgesic: Secondary | ICD-10-CM | POA: Diagnosis not present

## 2022-04-10 DIAGNOSIS — Z5181 Encounter for therapeutic drug level monitoring: Secondary | ICD-10-CM | POA: Diagnosis not present

## 2022-04-10 DIAGNOSIS — M542 Cervicalgia: Secondary | ICD-10-CM

## 2022-04-10 DIAGNOSIS — M797 Fibromyalgia: Secondary | ICD-10-CM | POA: Diagnosis not present

## 2022-04-10 DIAGNOSIS — G894 Chronic pain syndrome: Secondary | ICD-10-CM

## 2022-04-10 DIAGNOSIS — M2550 Pain in unspecified joint: Secondary | ICD-10-CM | POA: Diagnosis not present

## 2022-04-10 DIAGNOSIS — M7062 Trochanteric bursitis, left hip: Secondary | ICD-10-CM | POA: Diagnosis not present

## 2022-04-10 DIAGNOSIS — J301 Allergic rhinitis due to pollen: Secondary | ICD-10-CM

## 2022-04-10 MED ORDER — HYDROCODONE-ACETAMINOPHEN 10-325 MG PO TABS
1.0000 | ORAL_TABLET | Freq: Three times a day (TID) | ORAL | 0 refills | Status: DC | PRN
  Filled 2022-04-10: qty 90, 30d supply, fill #0

## 2022-04-10 NOTE — Progress Notes (Signed)
Subjective:    Patient ID: Karina Greer, female    DOB: [birth year], 56 y.o.   MRN: [id number]  HPI: Karina Greer is a 56 y.o. female who returns for follow up appointment for chronic pain and medication refill. She states her pain is located in her neck, left hip, left lower extremity with numbness and generalized joint pain. She rates her pain 4. Her current exercise regime is walking and performing stretching exercises.  [name] Morphine equivalent is 30.00 MME.   Last UDS 02/26/2022, it ws consistent.      Pain Inventory Average Pain 7 Pain Right Now 4 My pain is constant and aching  In the last 24 hours, has pain interfered with the following? General activity 4 Relation with others 4 Enjoyment of life 4 What TIME of day is your pain at its worst? morning , daytime, evening, and night Sleep (in general) Fair  Pain is worse with: walking, bending, sitting, inactivity, standing, and some activites Pain improves with: rest, heat/ice, therapy/exercise, pacing activities, medication, and injections Relief from Meds: 7  Family History  Problem Relation Age of Onset   Hypertension Mother    Diabetes Father    Brain cancer Father    Clotting disorder Father        blood clots   Fibromyalgia Sister    Suicidality Sister    Hypertension Other        Cancer, Cerebrovascular disease run on mother side of family   Social History   Socioeconomic History   Marital status: Divorced    Spouse name: Not on file   Number of children: 3   Years of education: HS   Highest education level: 12th grade  Occupational History   Occupation: Merchandiser, retail: UNEMPLOYED    Comment: Disability  Tobacco Use   Smoking status: Never   Smokeless tobacco: Never  Vaping Use   Vaping Use: Never used  Substance and Sexual Activity   Alcohol use: No   Drug use: No   Sexual activity: Not on file  Other Topics Concern   Not on file  Social History Narrative   Patient is  right-handed. She avoids caffeine. She has recently been using the treadmill.   Social Determinants of Health   Financial Resource Strain: Not on file  Food Insecurity: Not on file  Transportation Needs: Not on file  Physical Activity: Not on file  Stress: Not on file  Social Connections: Not on file   Past Surgical History:  Procedure Laterality Date   ACHILLES TENDON SURGERY Right 3/16   ANTERIOR CERVICAL DECOMP/DISCECTOMY FUSION N/A 03/11/2016   Procedure: C5-6, C6-7 Anterior Cervical Discectomy and Fusion, Allograft, Plate;  Surgeon: Karina Greer;  Location: [facility];  Service: Orthopedics;  Laterality: N/A;   CHOLECYSTECTOMY  2014   COLONOSCOPY WITH PROPOFOL N/A 08/25/2017   Procedure: COLONOSCOPY WITH PROPOFOL;  Surgeon: Karina Greer;  Location: [facility] ENDOSCOPY;  Service: Endoscopy;  Laterality: N/A;   CYSTO WITH HYDRODISTENSION N/A 01/26/2014   Procedure: CYSTOSCOPY/HYDRODISTENSION;  Surgeon: Karina Greer;  Location: [facility];  Service: Urology;  Laterality: N/A;   CYSTO/  URETHRAL DILATION/  HYDRODISTENTION/   INSTILLATION THERAPY  07-16-2010//   12-30-2007//   10-27-2006   ELBOW SURGERY Right    EXERCISE TOLERENCE TEST  10-12-2010   NEGATIVE  ADEQUATE ETT/  NO ISCHEMIA OR EVIDENCE HIGH GRADE OBSTRUCTIVE CAD/  NO FURTHER TEST NEEDED   HYSTEROSCOPY W/  [place] ENDOMETRIAL ABLATION  2014   LAPAROSCOPIC OVARIAN CYST BX  2005   AND  URETEROSCOPIC LASER LITHO  STONE EXTRACTION   SHOULDER OPEN ROTATOR CUFF REPAIR Right 2004   TONSILLECTOMY     TRANSTHORACIC ECHOCARDIOGRAM  06-07-2006   normal study/  ef 60-65%   TUBAL LIGATION Bilateral 1995   Past Surgical History:  Procedure Laterality Date   ACHILLES TENDON SURGERY Right 3/16   ANTERIOR CERVICAL DECOMP/DISCECTOMY FUSION N/A 03/11/2016   Procedure: C5-6, C6-7 Anterior Cervical Discectomy and Fusion, Allograft, Plate;  Surgeon: Karina Greer;  Location: [place];  Service: Orthopedics;   Laterality: N/A;   CHOLECYSTECTOMY  2014   COLONOSCOPY WITH PROPOFOL N/A 08/25/2017   Procedure: COLONOSCOPY WITH PROPOFOL;  Surgeon: Karina Greer;  Location: [facility] ENDOSCOPY;  Service: Endoscopy;  Laterality: N/A;   CYSTO WITH HYDRODISTENSION N/A 01/26/2014   Procedure: CYSTOSCOPY/HYDRODISTENSION;  Surgeon: Karina Greer;  Location: [facility];  Service: Urology;  Laterality: N/A;   CYSTO/  URETHRAL DILATION/  HYDRODISTENTION/   INSTILLATION THERAPY  07-16-2010//   12-30-2007//   10-27-2006   ELBOW SURGERY Right    EXERCISE TOLERENCE TEST  10-12-2010   NEGATIVE  ADEQUATE ETT/  NO ISCHEMIA OR EVIDENCE HIGH GRADE OBSTRUCTIVE CAD/  NO FURTHER TEST NEEDED   HYSTEROSCOPY W/  [place] ENDOMETRIAL ABLATION  2014   LAPAROSCOPIC OVARIAN CYST BX  2005   AND  URETEROSCOPIC LASER LITHO  STONE EXTRACTION   SHOULDER OPEN ROTATOR CUFF REPAIR Right 2004   TONSILLECTOMY     TRANSTHORACIC ECHOCARDIOGRAM  06-07-2006   normal study/  ef 60-65%   TUBAL LIGATION Bilateral 1995   Past Medical History:  Diagnosis Date   Anxiety    Anxiety disorder    Arthritis    Blepharitis    Chronic low back pain    Chronic pain    Depression    Diabetes mellitus without complication (HCC)    Dyslipidemia    Family history of blood clots    Fibromyalgia    GERD (gastroesophageal reflux disease)    Headache    hx migraines   History of kidney stones    History of panic attacks    History of renal calculi    IBS (irritable bowel syndrome)    Interstitial cystitis    Lupus ([place])    tested positive for antibodies for lupus. dr to do further tests   Menorrhagia    OSA on CPAP    not used cpap for several weeks   Pneumonia    hx   PONV (postoperative nausea and vomiting)    RLS (restless legs syndrome)    Rosacea    Seasonal asthma    SI (sacroiliac) joint dysfunction    SUI (stress urinary incontinence, female)    UTI (lower urinary tract infection)    hx   White matter  abnormality on MRI of brain 02/23/2013   BP 138/81   Pulse 64   Temp 97.7 F (36.5 C)   Ht '5\' 3"'$  (1.6 m)   Wt 227 lb (103 kg)   SpO2 97%   BMI 40.21 kg/m   Opioid Risk Score:   Fall Risk Score:  `1  Depression screen [facility] 2/9     04/10/2022    9:35 AM 03/13/2022    9:52 AM 02/26/2022    9:25 AM 02/11/2022   11:23 AM 01/14/2022    9:30 AM 12/14/2021    1:03 PM 12/04/2021  1:00 PM  Depression screen PHQ 2/9  Decreased Interest 0 0 0 0 0 1 1  Down, Depressed, Hopeless 0 0 0 0 0 1 1  PHQ - 2 Score 0 0 0 0 0 2 2    Review of Systems  Musculoskeletal:  Positive for arthralgias and back pain.       Pain all over the body  All other systems reviewed and are negative.      Objective:   Physical Exam Vitals and nursing note reviewed.  Constitutional:      Appearance: Normal appearance.  Neck:     Comments: Cervical Paraspinal Tenderness: C-4-C-5 Mainly Right Side Cardiovascular:     Rate and Rhythm: Normal rate and regular rhythm.     Pulses: Normal pulses.     Heart sounds: Normal heart sounds.  Pulmonary:     Effort: Pulmonary effort is normal.     Breath sounds: Normal breath sounds.  Musculoskeletal:     Cervical back: Normal range of motion and neck supple.     Comments: Normal Muscle Bulk and Muscle Testing Reveals:  Upper Extremities: Full ROM and Muscle Strength 5/5 Left Greater Trochanter Tenderness Lower Extremities : Full ROM and Muscle Strength 5/5 Arises from Chair Slowly Narrow Based Gait     Skin:    General: Skin is warm and dry.  Neurological:     Mental Status: She is alert and oriented to person, place, and time.  Psychiatric:        Mood and Affect: Mood normal.        Behavior: Behavior normal.         Assessment & Plan:  1. Lumbago/ Lumbar Spondylosis/ Left Lumbar Radiculitis: Continue to Monitor. 07/19203. Refilled: HYDROcodone 10/'325mg'$  one tablet every 8 hours as needed #90.  We will continue the opioid monitoring program, this  consists of regular clinic visits, examinations, urine drug screen, pill counts as well as use of [place] Controlled Substance Reporting system. A 12 month History has been reviewed on the [place] on 04/10/2022. 2. Fibromyalgia. Continue Current exercise Regime. 04/10/2022 3. Anxiety and depression:Psychiatry Following: Counselor Jessica  at the [facility] . Continue Counseling at [facility]. 04/10/2022 4. Migraines: On Maxalt. Neurology Following. 04/10/2022 5. OSA : PCP Following. Continue to Monitor. 04/10/2022 6. Obesity: Dr Ranell Patrick Following: Continue  Healthy Diet Regimen and Continue HEP as Tolerated. 04/10/2022 7. Status Post Cervical Spinal Fusion: C5C6- C-6- C-7 Anterior Cervical Discectomy Fusion and Allograft Plate: Dr. Lorin Mercy Following. 04/10/2022 8. Cervicalgia/ Cervical Radiculitis/ S/P Cervical Spinal Fusion:  Continue to Monitor Dr. Lorin Mercy Following. 04/10/2022 9. Muscle Spasm: Continue Tizanidine as needed.04/10/2022 10.Hereditary and Idiopathic Peripheral Neuropathy Continue with Tens Unit. 04/10/2022. 11.Left Greater Trochanteric Bursitis: . Continue to Alternate Ice and heat Therapy. Continue to Monitor. 04/10/2022. 12. Chronic Bilateral Knee Pain: No Complaints Today. Ortho Following. Continue HEP as Tolerated. Continue to Monitor. 04/10/2022 13. Lateral Epicondylitis of Right Elbow: No complaints today.  Ortho Following. S/P   06/26/2020 Right lateral epicondyle debridement, drilling and repair  By Dr Lorin Mercy. 14. Neuralgia/ Right Facial Droop: Neurology following. Continue to monitor. 04/10/2022. 15. Polyarthralgia: No complaints today. Continue HEP as tolerated. Continue to monitor. 04/10/2022  16. Paresthesia of Right Lower Extremity: Occasionally: Continue current medication regimen. Continue to monitor. 04/10/2022     F/U in 1 month .

## 2022-04-17 ENCOUNTER — Ambulatory Visit: Payer: Medicare Other

## 2022-04-18 ENCOUNTER — Other Ambulatory Visit: Payer: Self-pay | Admitting: Registered Nurse

## 2022-04-19 ENCOUNTER — Other Ambulatory Visit (HOSPITAL_COMMUNITY): Payer: Self-pay

## 2022-04-19 MED ORDER — TIZANIDINE HCL 2 MG PO TABS
2.0000 mg | ORAL_TABLET | Freq: Three times a day (TID) | ORAL | 2 refills | Status: DC | PRN
  Filled 2022-04-19 – 2022-05-08 (×2): qty 50, 17d supply, fill #0

## 2022-04-23 ENCOUNTER — Ambulatory Visit: Payer: Medicare Other

## 2022-04-24 ENCOUNTER — Ambulatory Visit (INDEPENDENT_AMBULATORY_CARE_PROVIDER_SITE_OTHER): Payer: Medicare Other

## 2022-04-24 DIAGNOSIS — J301 Allergic rhinitis due to pollen: Secondary | ICD-10-CM

## 2022-04-29 ENCOUNTER — Other Ambulatory Visit (HOSPITAL_COMMUNITY): Payer: Self-pay

## 2022-05-01 ENCOUNTER — Ambulatory Visit: Payer: Medicare Other

## 2022-05-03 ENCOUNTER — Telehealth: Payer: Self-pay

## 2022-05-03 NOTE — Telephone Encounter (Signed)
Patient's benefits has been checked for allergy injections.  Medicare active 09/23/21- current/ 20 % coins/ ded met  Medicaid active 09/23/21-current/ no auth/ medicaid covers 20% coins

## 2022-05-06 DIAGNOSIS — L304 Erythema intertrigo: Secondary | ICD-10-CM | POA: Diagnosis not present

## 2022-05-06 DIAGNOSIS — M810 Age-related osteoporosis without current pathological fracture: Secondary | ICD-10-CM | POA: Diagnosis not present

## 2022-05-06 DIAGNOSIS — R82994 Hypercalciuria: Secondary | ICD-10-CM | POA: Diagnosis not present

## 2022-05-06 DIAGNOSIS — J019 Acute sinusitis, unspecified: Secondary | ICD-10-CM | POA: Diagnosis not present

## 2022-05-07 ENCOUNTER — Ambulatory Visit: Payer: Medicare Other

## 2022-05-07 DIAGNOSIS — I700 Atherosclerosis of aorta: Secondary | ICD-10-CM | POA: Diagnosis not present

## 2022-05-07 DIAGNOSIS — E782 Mixed hyperlipidemia: Secondary | ICD-10-CM | POA: Diagnosis not present

## 2022-05-08 ENCOUNTER — Encounter: Payer: Medicare Other | Attending: Physical Medicine & Rehabilitation | Admitting: Registered Nurse

## 2022-05-08 ENCOUNTER — Telehealth: Payer: Self-pay

## 2022-05-08 ENCOUNTER — Encounter: Payer: Self-pay | Admitting: Registered Nurse

## 2022-05-08 ENCOUNTER — Other Ambulatory Visit (HOSPITAL_COMMUNITY): Payer: Self-pay

## 2022-05-08 ENCOUNTER — Ambulatory Visit (INDEPENDENT_AMBULATORY_CARE_PROVIDER_SITE_OTHER): Payer: Medicare Other

## 2022-05-08 VITALS — BP 133/90 | HR 71 | Ht 63.0 in | Wt 228.0 lb

## 2022-05-08 DIAGNOSIS — M7062 Trochanteric bursitis, left hip: Secondary | ICD-10-CM | POA: Insufficient documentation

## 2022-05-08 DIAGNOSIS — G894 Chronic pain syndrome: Secondary | ICD-10-CM | POA: Diagnosis not present

## 2022-05-08 DIAGNOSIS — M2550 Pain in unspecified joint: Secondary | ICD-10-CM | POA: Insufficient documentation

## 2022-05-08 DIAGNOSIS — M546 Pain in thoracic spine: Secondary | ICD-10-CM | POA: Insufficient documentation

## 2022-05-08 DIAGNOSIS — M797 Fibromyalgia: Secondary | ICD-10-CM | POA: Insufficient documentation

## 2022-05-08 DIAGNOSIS — Z5181 Encounter for therapeutic drug level monitoring: Secondary | ICD-10-CM | POA: Insufficient documentation

## 2022-05-08 DIAGNOSIS — M545 Low back pain, unspecified: Secondary | ICD-10-CM | POA: Insufficient documentation

## 2022-05-08 DIAGNOSIS — Z79891 Long term (current) use of opiate analgesic: Secondary | ICD-10-CM | POA: Insufficient documentation

## 2022-05-08 DIAGNOSIS — G8929 Other chronic pain: Secondary | ICD-10-CM | POA: Diagnosis not present

## 2022-05-08 DIAGNOSIS — M542 Cervicalgia: Secondary | ICD-10-CM | POA: Insufficient documentation

## 2022-05-08 DIAGNOSIS — J301 Allergic rhinitis due to pollen: Secondary | ICD-10-CM

## 2022-05-08 MED ORDER — HYDROCODONE-ACETAMINOPHEN 10-325 MG PO TABS
1.0000 | ORAL_TABLET | Freq: Three times a day (TID) | ORAL | 0 refills | Status: DC | PRN
  Filled 2022-05-08: qty 90, 30d supply, fill #0

## 2022-05-08 MED ORDER — CHLORHEXIDINE GLUCONATE 0.12 % MT SOLN
15.0000 mL | OROMUCOSAL | 3 refills | Status: DC
  Filled 2022-05-08: qty 473, 16d supply, fill #0

## 2022-05-08 NOTE — Progress Notes (Signed)
Subjective:    Patient ID: Karina Greer, female    DOB: [birth year], 56 y.o.   MRN: [id number]  HPI: Karina Greer is a 56 y.o. female who returns for follow up appointment for chronic pain and medication refill. She states today she has a slight headache, for the last few days, neck pain, mid- lower back pain and left hip pain. Also reports generalized joint pain. She  rates her pain 5. Her  current exercise regime is walking and performing stretching exercises. Karina Greer reports on Sunday she was walking in her kitchen when she experience vertigo was able to brace her fall, she her head against the chair. She also states she seen her PCP on Monday.   Karina Greer Morphine equivalent is 30.00 MME.   Last UDS was Performed on 02/26/2022, it was consistent.     Pain Inventory Average Pain 8 Pain Right Now 5 My pain is constant, sharp, stabbing, and aching  In the last 24 hours, has pain interfered with the following? General activity 6 Relation with others 6 Enjoyment of life 4 What TIME of day is your pain at its worst? morning , daytime, evening, and night Sleep (in general) Fair  Pain is worse with: walking, bending, sitting, standing, and some activites Pain improves with: rest, heat/ice, therapy/exercise, pacing activities, medication, and injections Relief from Meds: 6  Family History  Problem Relation Age of Onset   Hypertension Mother    Diabetes Father    Brain cancer Father    Clotting disorder Father        blood clots   Fibromyalgia Sister    Suicidality Sister    Hypertension Other        Cancer, Cerebrovascular disease run on mother side of family   Social History   Socioeconomic History   Marital status: Divorced    Spouse name: Not on file   Number of children: 3   Years of education: HS   Highest education level: 12th grade  Occupational History   Occupation: Merchandiser, retail: UNEMPLOYED    Comment: Disability  Tobacco Use   Smoking status:  Never   Smokeless tobacco: Never  Vaping Use   Vaping Use: Never used  Substance and Sexual Activity   Alcohol use: No   Drug use: No   Sexual activity: Not on file  Other Topics Concern   Not on file  Social History Narrative   Patient is right-handed. She avoids caffeine. She has recently been using the treadmill.   Social Determinants of Health   Financial Resource Strain: Not on file  Food Insecurity: Not on file  Transportation Needs: Not on file  Physical Activity: Not on file  Stress: Not on file  Social Connections: Not on file   Past Surgical History:  Procedure Laterality Date   ACHILLES TENDON SURGERY Right 3/16   ANTERIOR CERVICAL DECOMP/DISCECTOMY FUSION N/A 03/11/2016   Procedure: C5-6, C6-7 Anterior Cervical Discectomy and Fusion, Allograft, Plate;  Surgeon: Marybelle Killings, MD;  Location: [facility];  Service: Orthopedics;  Laterality: N/A;   CHOLECYSTECTOMY  2014   COLONOSCOPY WITH PROPOFOL N/A 08/25/2017   Procedure: COLONOSCOPY WITH PROPOFOL;  Surgeon: Manya Silvas, MD;  Location: [facility] ENDOSCOPY;  Service: Endoscopy;  Laterality: N/A;   CYSTO WITH HYDRODISTENSION N/A 01/26/2014   Procedure: CYSTOSCOPY/HYDRODISTENSION;  Surgeon: Bernestine Amass, MD;  Location: [facility];  Service: Urology;  Laterality: N/A;   CYSTO/  URETHRAL DILATION/  HYDRODISTENTION/   INSTILLATION THERAPY  07-16-2010//   12-30-2007//   10-27-2006   ELBOW SURGERY Right    EXERCISE TOLERENCE TEST  10-12-2010   NEGATIVE  ADEQUATE ETT/  NO ISCHEMIA OR EVIDENCE HIGH GRADE OBSTRUCTIVE CAD/  NO FURTHER TEST NEEDED   HYSTEROSCOPY W/  NOVASURE ENDOMETRIAL ABLATION  2014   LAPAROSCOPIC OVARIAN CYST BX  2005   AND  URETEROSCOPIC LASER LITHO  STONE EXTRACTION   SHOULDER OPEN ROTATOR CUFF REPAIR Right 2004   TONSILLECTOMY     TRANSTHORACIC ECHOCARDIOGRAM  06-07-2006   normal study/  ef 60-65%   TUBAL LIGATION Bilateral 1995   Past Surgical History:  Procedure Laterality Date   ACHILLES  TENDON SURGERY Right 3/16   ANTERIOR CERVICAL DECOMP/DISCECTOMY FUSION N/A 03/11/2016   Procedure: C5-6, C6-7 Anterior Cervical Discectomy and Fusion, Allograft, Plate;  Surgeon: Marybelle Killings, MD;  Location: [facility];  Service: Orthopedics;  Laterality: N/A;   CHOLECYSTECTOMY  2014   COLONOSCOPY WITH PROPOFOL N/A 08/25/2017   Procedure: COLONOSCOPY WITH PROPOFOL;  Surgeon: Manya Silvas, MD;  Location: [facility] ENDOSCOPY;  Service: Endoscopy;  Laterality: N/A;   CYSTO WITH HYDRODISTENSION N/A 01/26/2014   Procedure: CYSTOSCOPY/HYDRODISTENSION;  Surgeon: Bernestine Amass, MD;  Location: [facility];  Service: Urology;  Laterality: N/A;   CYSTO/  URETHRAL DILATION/  HYDRODISTENTION/   INSTILLATION THERAPY  07-16-2010//   12-30-2007//   10-27-2006   ELBOW SURGERY Right    EXERCISE TOLERENCE TEST  10-12-2010   NEGATIVE  ADEQUATE ETT/  NO ISCHEMIA OR EVIDENCE HIGH GRADE OBSTRUCTIVE CAD/  NO FURTHER TEST NEEDED   HYSTEROSCOPY W/  [place] ENDOMETRIAL ABLATION  2014   LAPAROSCOPIC OVARIAN CYST BX  2005   AND  URETEROSCOPIC LASER LITHO  STONE EXTRACTION   SHOULDER OPEN ROTATOR CUFF REPAIR Right 2004   TONSILLECTOMY     TRANSTHORACIC ECHOCARDIOGRAM  06-07-2006   normal study/  ef 60-65%   TUBAL LIGATION Bilateral 1995   Past Medical History:  Diagnosis Date   Anxiety    Anxiety disorder    Arthritis    Blepharitis    Chronic low back pain    Chronic pain    Depression    Diabetes mellitus without complication (HCC)    Dyslipidemia    Family history of blood clots    Fibromyalgia    GERD (gastroesophageal reflux disease)    Headache    hx migraines   History of kidney stones    History of panic attacks    History of renal calculi    IBS (irritable bowel syndrome)    Interstitial cystitis    Lupus ([place])    tested positive for antibodies for lupus. dr to do further tests   Menorrhagia    OSA on CPAP    not used cpap for several weeks   Pneumonia    hx   PONV (postoperative  nausea and vomiting)    RLS (restless legs syndrome)    Rosacea    Seasonal asthma    SI (sacroiliac) joint dysfunction    SUI (stress urinary incontinence, female)    UTI (lower urinary tract infection)    hx   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 02/23/2013   BP (!) 133/90   Pulse 71   Ht '5\' 3"'$  (1.6 m)   Wt 228 lb (103.4 kg)   SpO2 98%   BMI 40.39 kg/m   Opioid Risk Score:   Fall Risk Score:  `1  Depression  screen PHQ 2/9     05/08/2022    9:58 AM 04/10/2022    9:35 AM 03/13/2022    9:52 AM 02/26/2022    9:25 AM 02/11/2022   11:23 AM 01/14/2022    9:30 AM 12/14/2021    1:03 PM  Depression screen PHQ 2/9  Decreased Interest 0 0 0 0 0 0 1  Down, Depressed, Hopeless 0 0 0 0 0 0 1  PHQ - 2 Score 0 0 0 0 0 0 2     Review of Systems  Musculoskeletal:  Positive for myalgias.  All other systems reviewed and are negative.     Objective:   Physical Exam Vitals and nursing note reviewed.  Constitutional:      Appearance: Normal appearance. She is obese.  Cardiovascular:     Rate and Rhythm: Normal rate and regular rhythm.     Pulses: Normal pulses.     Heart sounds: Normal heart sounds.  Pulmonary:     Effort: Pulmonary effort is normal.     Breath sounds: Normal breath sounds.  Musculoskeletal:     Cervical back: Normal range of motion and neck supple.     Comments: Normal Muscle Bulk and Muscle Testing Reveals:  Upper Extremities: Full ROM and Muscle Strength 5/5 Left AC Joint Tenderness Thoracic Paraspinal Tenderness: T-7-T-9 Lumbar Paraspinal Tenderness: L-3-L-5 Left Greater Trochanter Tenderness Lower Extremities: Full ROM and Muscle Strength 5/5 Left Lower Extremity Flexion Produces Pain into her Left Hip Arises from Chair with ease Narrow Based  Gait     Skin:    General: Skin is warm and dry.  Neurological:     Mental Status: She is alert and oriented to person, place, and time.  Psychiatric:        Mood and Affect: Mood normal.        Behavior:  Behavior normal.         Assessment & Plan:  1. Lumbago/ Lumbar Spondylosis/ Left Lumbar Radiculitis: Continue to Monitor. 08/16203 Refilled: HYDROcodone 10/'325mg'$  one tablet every 8 hours as needed #90.  We will continue the opioid monitoring program, this consists of regular clinic visits, examinations, urine drug screen, pill counts as well as use of [place] Controlled Substance Reporting system. A 12 month History has been reviewed on the [facility] on 05/08/2022. 2. Fibromyalgia. Continue Current exercise Regime. 05/08/2022 3. Anxiety and depression:Psychiatry Following: Counselor Jessica  at the [facility] . Continue Counseling at [facility]. 05/08/2022 4. Migraines: On Maxalt. Neurology Following. 05/08/2022 5. OSA : PCP Following. Continue to Monitor. 05/08/2022 6. Obesity: Dr Ranell Patrick Following: Continue  Healthy Diet Regimen and Continue HEP as Tolerated. 05/08/2022 7. Status Post Cervical Spinal Fusion: C5C6- C-6- C-7 Anterior Cervical Discectomy Fusion and Allograft Plate: Dr. Lorin Mercy Following. 05/08/2022 8. Cervicalgia/ Cervical Radiculitis/ S/P Cervical Spinal Fusion:  Continue to Monitor Dr. Lorin Mercy Following. 05/08/2022 9. Muscle Spasm: Continue Tizanidine as needed.05/08/2022 10.Hereditary and Idiopathic Peripheral Neuropathy Continue with Tens Unit. 05/08/2022. 11.Left Greater Trochanteric Bursitis: . Continue to Alternate Ice and heat Therapy. Continue to Monitor. 05/08/2022. 12. Chronic Bilateral Knee Pain: No Complaints Today. Ortho Following. Continue HEP as Tolerated. Continue to Monitor. 05/08/2022 13. Lateral Epicondylitis of Right Elbow: No complaints today.  Ortho Following. S/P   06/26/2020 Right lateral epicondyle debridement, drilling and repair  By Dr Lorin Mercy. 14. Neuralgia/ Right Facial Droop: Neurology following. Continue to monitor. 05/08/2022. 15. Polyarthralgia: No complaints today. Continue HEP as  tolerated. Continue to monitor. 05/08/2022  16. Paresthesia of Right Lower Extremity: Occasionally: Continue current medication regimen. Continue to monitor. 05/08/2022   F/U in 1 month .

## 2022-05-08 NOTE — Telephone Encounter (Signed)
Mixed allergy serum and sent [facility] message for billing to be done on 2 vials serum

## 2022-05-15 ENCOUNTER — Ambulatory Visit: Payer: Medicare Other

## 2022-05-15 DIAGNOSIS — Z887 Allergy status to serum and vaccine status: Secondary | ICD-10-CM

## 2022-05-15 DIAGNOSIS — J301 Allergic rhinitis due to pollen: Secondary | ICD-10-CM

## 2022-05-15 NOTE — Progress Notes (Signed)
Order 1 of 1   Vial Label: TGW   0.20 mL of each antigen: Acacia, Cottonwood, Dust Mite Mix, English Plantain, Lambs Quarter, Maple Box Elder, [employer], Pigweed Mix, Privet, Ragweed Mix, [employer] Burch, and White/Red Mulberry     2.4   mL Extract Subtotal 2.6   mL normal saline Diluent  5.0  mL Maintenance Total       Order 2 of 1   Vial Label: CRMD   0.20 mL of each antigen: Aspergillus Fumigatus, Cladosporium Herbarum, Cladosporium Sphaerospermum, Cockroach Mix, Dog Mixed breeds, and Penicillin Chrysogenum   1.2   mL Extract Subtotal 3.8   mL Normal Saline Diluent 5.0  mL Maintenance Total

## 2022-05-21 ENCOUNTER — Ambulatory Visit (INDEPENDENT_AMBULATORY_CARE_PROVIDER_SITE_OTHER): Payer: Medicare Other

## 2022-05-21 DIAGNOSIS — J301 Allergic rhinitis due to pollen: Secondary | ICD-10-CM

## 2022-05-29 ENCOUNTER — Other Ambulatory Visit: Payer: Self-pay | Admitting: Internal Medicine

## 2022-05-31 ENCOUNTER — Other Ambulatory Visit (HOSPITAL_COMMUNITY): Payer: Self-pay

## 2022-05-31 ENCOUNTER — Telehealth: Payer: Self-pay | Admitting: Registered Nurse

## 2022-05-31 DIAGNOSIS — M797 Fibromyalgia: Secondary | ICD-10-CM

## 2022-05-31 MED ORDER — HYDROCODONE-ACETAMINOPHEN 10-325 MG PO TABS
1.0000 | ORAL_TABLET | Freq: Three times a day (TID) | ORAL | 0 refills | Status: DC | PRN
  Filled 2022-05-31 – 2022-06-06 (×2): qty 90, 30d supply, fill #0

## 2022-05-31 NOTE — Telephone Encounter (Signed)
PMP was Reviewed.  Hydrocodone e-scribed today.  Karina Greer is aware of the above.

## 2022-05-31 NOTE — Telephone Encounter (Signed)
error 

## 2022-06-03 ENCOUNTER — Encounter: Payer: Medicare Other | Admitting: Registered Nurse

## 2022-06-03 DIAGNOSIS — R7309 Other abnormal glucose: Secondary | ICD-10-CM | POA: Diagnosis not present

## 2022-06-03 DIAGNOSIS — S90811A Abrasion, right foot, initial encounter: Secondary | ICD-10-CM | POA: Diagnosis not present

## 2022-06-03 DIAGNOSIS — M79661 Pain in right lower leg: Secondary | ICD-10-CM | POA: Diagnosis not present

## 2022-06-04 ENCOUNTER — Ambulatory Visit (INDEPENDENT_AMBULATORY_CARE_PROVIDER_SITE_OTHER): Payer: Medicare Other

## 2022-06-04 DIAGNOSIS — J301 Allergic rhinitis due to pollen: Secondary | ICD-10-CM | POA: Diagnosis not present

## 2022-06-06 ENCOUNTER — Other Ambulatory Visit (HOSPITAL_COMMUNITY): Payer: Self-pay

## 2022-06-10 DIAGNOSIS — E7800 Pure hypercholesterolemia, unspecified: Secondary | ICD-10-CM | POA: Diagnosis not present

## 2022-06-10 DIAGNOSIS — I700 Atherosclerosis of aorta: Secondary | ICD-10-CM | POA: Diagnosis not present

## 2022-06-10 DIAGNOSIS — I708 Atherosclerosis of other arteries: Secondary | ICD-10-CM | POA: Diagnosis not present

## 2022-06-10 DIAGNOSIS — B001 Herpesviral vesicular dermatitis: Secondary | ICD-10-CM | POA: Diagnosis not present

## 2022-06-10 DIAGNOSIS — M810 Age-related osteoporosis without current pathological fracture: Secondary | ICD-10-CM | POA: Diagnosis not present

## 2022-06-10 DIAGNOSIS — K219 Gastro-esophageal reflux disease without esophagitis: Secondary | ICD-10-CM | POA: Diagnosis not present

## 2022-06-10 DIAGNOSIS — L853 Xerosis cutis: Secondary | ICD-10-CM | POA: Diagnosis not present

## 2022-06-10 DIAGNOSIS — E119 Type 2 diabetes mellitus without complications: Secondary | ICD-10-CM | POA: Diagnosis not present

## 2022-06-10 DIAGNOSIS — E559 Vitamin D deficiency, unspecified: Secondary | ICD-10-CM | POA: Diagnosis not present

## 2022-06-25 ENCOUNTER — Ambulatory Visit (INDEPENDENT_AMBULATORY_CARE_PROVIDER_SITE_OTHER): Payer: Medicare Other | Admitting: Podiatry

## 2022-06-25 DIAGNOSIS — L853 Xerosis cutis: Secondary | ICD-10-CM | POA: Diagnosis not present

## 2022-06-25 MED ORDER — AMMONIUM LACTATE 12 % EX LOTN: 1.0000 | TOPICAL_LOTION | CUTANEOUS | 0 refills | Status: DC | PRN

## 2022-06-25 NOTE — Progress Notes (Signed)
Subjective:  Patient ID: Karina Greer, female    DOB: 1966/05/05,  MRN: [id number]  Chief Complaint  Patient presents with   Diabetes    Cold feet and dry/cracked heels     56 y.o. female presents with the above complaint.  Patient presents with bilateral heel cracked heel.  Patient states pain for touch is progressive gotten worse worse with ambulation hurts with pressure.  She is a diabetic.  Her A1c is well controlled at 5.7.  She wanted to get it evaluated she denies any other acute issues.  She states that she is tried over-the-counter medications and treatment none of which has helped.   Review of Systems: Negative except as noted in the HPI. Denies N/V/F/Ch.  Past Medical History:  Diagnosis Date   Anxiety    Anxiety disorder    Arthritis    Blepharitis    Chronic low back pain    Chronic pain    Depression    Diabetes mellitus without complication (HCC)    Dyslipidemia    Family history of blood clots    Fibromyalgia    GERD (gastroesophageal reflux disease)    Headache    hx migraines   History of kidney stones    History of panic attacks    History of renal calculi    IBS (irritable bowel syndrome)    Interstitial cystitis    Lupus ([place])    tested positive for antibodies for lupus. 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    Current Outpatient Medications:    ammonium lactate (AMLACTIN) 12 % lotion, Apply 1 Application topically as needed for dry skin., Disp: 400 g, Rfl: 0   ACCU-CHEK GUIDE test strip, USE 1 (ONE) EACH DAILY FOR GLUCOSE MONITORING, Disp: , Rfl:    albuterol (PROVENTIL) (2.5 MG/3ML) 0.083%  nebulizer solution, USE 1 VIAL IN NEBULIZER EVERY 6 HOURS, Disp: 120 mL, Rfl: 11   albuterol (VENTOLIN HFA) 108 (90 Base) MCG/ACT inhaler, INHALE 2 PUFFS BY MOUTH EVERY 4 TO 6 HOURS AS NEEDED, Disp: 8.5 each, Rfl: 6   aspirin 81 MG EC tablet, Take by mouth., Disp: , Rfl:    b complex vitamins tablet, Take 1 tablet by mouth daily., Disp: , Rfl:    calcitonin, salmon, (MIACALCIN/FORTICAL) 200 UNIT/ACT nasal spray, Place 1 spray into alternate nostrils daily. , Disp: , Rfl:    chlorhexidine (PERIDEX) 0.12 % solution, SMARTSIG:By Mouth, Disp: , Rfl:    chlorhexidine (PERIDEX) 0.12 % solution, After brushing swish 15 ml (1 capfull) for 30 seconds, then expectorate. Do not swallow. Do not eat for 2-3 hours after rinsing., Disp: 473 mL, Rfl: 3   Cholecalciferol (VITAMIN D) 50 MCG (2000 UT) CAPS, Take 1 capsule by mouth once a week., Disp: , Rfl:    Cholecalciferol 25 MCG (1000 UT) tablet, Take by mouth., Disp: , Rfl:    DEXILANT 60 MG capsule, Take 1 capsule by mouth daily as needed., Disp: , Rfl:    dicyclomine (BENTYL) 10 MG capsule, Take  10 mg by mouth 2 (two) times daily as needed., Disp: , Rfl:    EPINEPHrine (EPIPEN 2-PAK) 0.3 mg/0.3 mL IJ SOAJ injection, Use as directed, Disp: 1 each, Rfl: 1   fexofenadine (ALLEGRA) 180 MG tablet, Take 180 mg by mouth daily., Disp: , Rfl:    fluticasone (FLOVENT HFA) 110 MCG/ACT inhaler, INHALE 1 PUFF BY MOUTH TWICE A DAY, Disp: 12 each, Rfl: 5   HYDROcodone-acetaminophen (NORCO) 10-325 MG tablet, Take 1 tablet by mouth every 8 (eight) hours as needed for pain., Disp: 90 tablet, Rfl: 0   hydrocortisone valerate cream (WESTCORT) 0.2 %, Apply topically 2 (two) times daily., Disp: , Rfl:    hydrOXYzine (ATARAX) 25 MG tablet, Take 25 mg by mouth 3 (three) times daily., Disp: , Rfl:    ketoconazole (NIZORAL) 2 % shampoo, Apply 1 application topically 2 (two) times a week. , Disp: , Rfl:    KRILL OIL PO, Take by mouth., Disp: , Rfl:    lidocaine (LIDODERM) 5 %, Place 1  patch onto the skin daily. Remove & Discard patch within 12 hours or as directed by MD, Disp: 30 patch, Rfl: 0   magnesium oxide (MAG-OX) 400 MG tablet, Take by mouth., Disp: , Rfl:    meclizine (ANTIVERT) 25 MG tablet, Take by mouth., Disp: , Rfl:    ondansetron (ZOFRAN-ODT) 8 MG disintegrating tablet, Take 8 mg by mouth 3 (three) times daily., Disp: , Rfl:    PRESCRIPTION MEDICATION, every 14 (fourteen) days. 2 ALLERGY INJECTIONS EVERY 2 WKS, Disp: , Rfl:    rizatriptan (MAXALT-MLT) 10 MG disintegrating tablet, SMARTSIG:1 Tablet(s) By Mouth 1 to 2 Times Daily, Disp: , Rfl:    SPIRULINA PO, Take 1 capsule by mouth daily., Disp: , Rfl:    tiZANidine (ZANAFLEX) 2 MG tablet, Take 1 tablet (2 mg total) by mouth every 8 (eight) hours as needed., Disp: 50 tablet, Rfl: 2   tiZANidine (ZANAFLEX) 4 MG capsule, Take by mouth., Disp: , Rfl:    valACYclovir (VALTREX) 1000 MG tablet, Take by mouth., Disp: , Rfl:   Social History   Tobacco Use  Smoking Status Never  Smokeless Tobacco Never    Allergies  Allergen Reactions   Alendronate Sodium Anaphylaxis    Closes throat up   Doxycycline Other (See Comments)   Gadolinium Derivatives Hives, Nausea Only, Nausea And Vomiting and Rash   Iodinated Contrast Media Hives and Swelling   Milnacipran Hcl Other (See Comments)    Makes patient bleed "from everywhere."   Elavil [Amitriptyline Hcl] Other (See Comments)    Hallucinations    Tramadol Palpitations and Other (See Comments)    Tachycardia    Carbapenems Other (See Comments)    "Felt like death" "Felt like death"   Codeine Sulfate Other (See Comments)   Cymbalta [Duloxetine Hcl] Other (See Comments)    Cannot remember    Duloxetine Other (See Comments)    Facial swelling   Lamotrigine    Morphine Other (See Comments)    Caused anxiety attack pt felt like she was going to have a heart attack Tachycardia per pt   Nsaids Other (See Comments)    Upset stomach Upset stomach   Other Other  (See Comments)    bandaid bandaid bandaid bandaid   Ropinirole     Other reaction(s): Other (See Comments) Patient doesn't remember   Skelaxin Other (See Comments)    Cannot remember   Soolantra [Ivermectin]     Also Ivermectin   Tessalon [Benzonatate] Other (  See Comments)    "FEELS LIKE I'M CHOKING"   Diphenhydramine Hcl Anxiety   Gabapentin Nausea Only    Pt was unable to get out of bed, increased appetite  Pt was unable to get out of bed, increased appetite  Pt was unable to get out of bed, increased appetite    Oxycodone Itching and Rash    SEVERE ITCHING   Oxycodone-Acetaminophen Palpitations   Tetracycline Rash   Objective:  There were no vitals filed for this visit. There is no height or weight on file to calculate BMI. Constitutional Well developed. Well nourished.  Vascular Dorsalis pedis pulses palpable bilaterally. Posterior tibial pulses palpable bilaterally. Capillary refill normal to all digits.  No cyanosis or clubbing noted. Pedal hair growth normal.  Neurologic Normal speech. Oriented to person, place, and time. Epicritic sensation to light touch grossly present bilaterally.  Dermatologic Nails within normal limits Skin severe dry skin with cracking of the skin noted no deep wounds noted.  Fissuring noted.  Orthopedic: Normal joint ROM without pain or crepitus bilaterally. No visible deformities. No bony tenderness.   Radiographs: None Assessment:   1. Xerosis of skin    Plan:  Patient was evaluated and treated and all questions answered.  Bilateral severe dryness/xerosis -I explained to the patient the etiology of xerosis and various treatment options were extensively discussed.  I explained to the patient the importance of maintaining moisturization of the skin with application of over-the-counter lotion such as Eucerin or Luciderm.  I have asked the patient to apply this twice a day.  Given the patient has failed over-the-counter medication I  believe patient will benefit from ammonium lactate ammonium lactate was sent to the pharmacy she will apply twice a day.   No follow-ups on file.

## 2022-06-26 ENCOUNTER — Ambulatory Visit: Payer: Medicare Other

## 2022-07-01 ENCOUNTER — Other Ambulatory Visit (HOSPITAL_COMMUNITY): Payer: Self-pay

## 2022-07-01 ENCOUNTER — Encounter: Payer: Self-pay | Admitting: Physical Medicine and Rehabilitation

## 2022-07-01 ENCOUNTER — Ambulatory Visit (INDEPENDENT_AMBULATORY_CARE_PROVIDER_SITE_OTHER): Payer: Medicare Other

## 2022-07-01 ENCOUNTER — Encounter: Payer: Medicare Other | Attending: Physical Medicine & Rehabilitation | Admitting: Physical Medicine and Rehabilitation

## 2022-07-01 VITALS — BP 137/80 | HR 69 | Ht 63.0 in | Wt 229.0 lb

## 2022-07-01 DIAGNOSIS — M797 Fibromyalgia: Secondary | ICD-10-CM | POA: Diagnosis not present

## 2022-07-01 DIAGNOSIS — J301 Allergic rhinitis due to pollen: Secondary | ICD-10-CM

## 2022-07-01 DIAGNOSIS — G894 Chronic pain syndrome: Secondary | ICD-10-CM | POA: Insufficient documentation

## 2022-07-01 DIAGNOSIS — Z79891 Long term (current) use of opiate analgesic: Secondary | ICD-10-CM | POA: Insufficient documentation

## 2022-07-01 DIAGNOSIS — Z6841 Body Mass Index (BMI) 40.0 and over, adult: Secondary | ICD-10-CM | POA: Diagnosis not present

## 2022-07-01 DIAGNOSIS — Z5181 Encounter for therapeutic drug level monitoring: Secondary | ICD-10-CM | POA: Diagnosis not present

## 2022-07-01 MED ORDER — LIDOCAINE 5 % EX PTCH
1.0000 | MEDICATED_PATCH | CUTANEOUS | 3 refills | Status: DC
  Filled 2022-07-01: qty 10, 10d supply, fill #0
  Filled 2022-07-01: qty 28, 28d supply, fill #0
  Filled 2022-07-01: qty 80, 80d supply, fill #0
  Filled 2022-07-04 – 2022-07-22 (×3): qty 90, 90d supply, fill #0

## 2022-07-01 MED ORDER — TIZANIDINE HCL 2 MG PO TABS
2.0000 mg | ORAL_TABLET | Freq: Three times a day (TID) | ORAL | 2 refills | Status: DC | PRN
  Filled 2022-07-01: qty 50, 17d supply, fill #0
  Filled 2022-09-20: qty 50, 17d supply, fill #1
  Filled 2023-01-24: qty 50, 17d supply, fill #0

## 2022-07-01 MED ORDER — HYDROCODONE-ACETAMINOPHEN 10-325 MG PO TABS
1.0000 | ORAL_TABLET | Freq: Three times a day (TID) | ORAL | 0 refills | Status: DC | PRN
  Filled 2022-07-01 – 2022-07-04 (×3): qty 90, 30d supply, fill #0

## 2022-07-01 NOTE — Progress Notes (Addendum)
Subjective:    Patient ID: Karina Greer, female    DOB: 01-Mar-1966, 56 y.o.   MRN: [id number]   Karina Greer is a 56 y.o. female who presents for follow-up of fibromyalgia and interstitial cystitis.   1) Interstitial cystitis -she has been having burning when she urinates -she got UA done yesterday, discussed results with her today  2) Diffuse chronic pain: back pain and diffuse in joints and muscles -her back has been more stiff recently -cold weather worsens her symptoms -she tried naltrexone, she felt like it made her sick, she feels nauseous every day so it wasn't that- it was more flulike symptoms. This was with the '1mg'$  of naltrexone. She did not take opioid that day.  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 Sometimes she does not eat until lunch. She has been trying to do the fasting we discussed last visit.  -Her son is 278 pounds and 6 foot tall. He went to 242 lbs following a similar diet.  -Her dietary goals are to make use of what she eats.  -she has been eating a lot of salads.  -she cannot handle hard cheeses.  -she has been having a problem eating hamburger.  -Last night she made four pieces of chicken and put on some tomato sauce and red peppers, she sprinkled some cheese, added broccoli, olive oil, garlic, butter.  -she is planned to have her HgbA1c and lipid panel drawn by her PCP next week.  -has been having low back pain, MRI ordered and shows stable spondylosis from prior -she feels better when there  is no food in her stomach -she got organic oranges from a farm and is going to make her own orange juice -she bought the Bioptemizer's magnesium so will start this soon -she is having a lot of pain in her neck and feels this is from the weather and her surgery -her calf is hurting a lot. She has not had an ultrasound. -blood clots run in her family.   -she bought [employer], Engineering geologist, magnesium breakthrough, Krill oil, Spirulina, Coenzyme Q10 -she saw an endocrinologist and she did a swab test -she went today for bloodwork to check her potassium, cortisol to look for Cushing's Disease -she feels like her hormones are off balance.  -she feels that she has all the symptoms of Cushing's Disease -she was told her rheum panel is normal.  -she was told she has a meniscal tear by ortho    Pain Inventory Average Pain 6 Pain Right Now7 My pain is constant, sharp, stabbing, and tingling  In the last 24 hours, has pain interfered with the following? General activity 5 Relation with others 5 Enjoyment of life 3 What TIME of day is your pain at its worst? morning , daytime, evening, and night Sleep (in general) Fair  Pain is worse with: walking, bending, sitting, inactivity, standing, unsure, and some activites Pain improves with: rest, heat/ice, therapy/exercise, pacing activities, medication, TENS, and injections Relief  from Meds: 6  Family History  Problem Relation Age of Onset   Hypertension Mother    Diabetes Father    Brain cancer Father    Clotting disorder Father        blood clots   Fibromyalgia Sister    Suicidality Sister    Hypertension Other        Cancer, Cerebrovascular disease run on mother side of family   Social History   Socioeconomic History   Marital status: Divorced    Spouse name: Not on file   Number of children: 3   Years of education: HS   Highest education level: 12th grade  Occupational History   Occupation: Merchandiser, retail: UNEMPLOYED     Comment: Disability  Tobacco Use   Smoking status: Never   Smokeless tobacco: Never  Vaping Use   Vaping Use: Never used  Substance and Sexual Activity   Alcohol use: No   Drug use: No   Sexual activity: Not on file  Other Topics Concern   Not on file  Social History Narrative   Patient is right-handed. She avoids caffeine. She has recently been using the treadmill.   Social Determinants of Health   Financial Resource Strain: Not on file  Food Insecurity: Not on file  Transportation Needs: Not on file  Physical Activity: Not on file  Stress: Not on file  Social Connections: Not on file   Past Surgical History:  Procedure Laterality Date   ACHILLES TENDON SURGERY Right 3/16   ANTERIOR CERVICAL DECOMP/DISCECTOMY FUSION N/A 03/11/2016   Procedure: C5-6, C6-7 Anterior Cervical Discectomy and Fusion, Allograft, Plate;  Surgeon: Marybelle Killings, MD;  Location: [place];  Service: Orthopedics;  Laterality: N/A;   CHOLECYSTECTOMY  2014   COLONOSCOPY WITH PROPOFOL N/A 08/25/2017   Procedure: COLONOSCOPY WITH PROPOFOL;  Surgeon: Manya Silvas, MD;  Location: [facility] ENDOSCOPY;  Service: Endoscopy;  Laterality: N/A;   CYSTO WITH HYDRODISTENSION N/A 01/26/2014   Procedure: CYSTOSCOPY/HYDRODISTENSION;  Surgeon: Bernestine Amass, MD;  Location: [facility] [place];  Service: Urology;  Laterality: N/A;   CYSTO/  URETHRAL DILATION/  HYDRODISTENTION/   INSTILLATION THERAPY  07-16-2010//   12-30-2007//   10-27-2006   ELBOW SURGERY Right    EXERCISE TOLERENCE TEST  10-12-2010   NEGATIVE  ADEQUATE ETT/  NO ISCHEMIA OR EVIDENCE HIGH GRADE OBSTRUCTIVE CAD/  NO FURTHER TEST NEEDED   HYSTEROSCOPY W/  NOVASURE ENDOMETRIAL ABLATION  2014   LAPAROSCOPIC OVARIAN CYST BX  2005   AND  URETEROSCOPIC LASER LITHO  STONE EXTRACTION   SHOULDER OPEN ROTATOR CUFF REPAIR Right 2004   TONSILLECTOMY     TRANSTHORACIC ECHOCARDIOGRAM  06-07-2006   normal study/  ef 60-65%   TUBAL LIGATION Bilateral 1995   Past  Surgical History:  Procedure Laterality Date   ACHILLES TENDON SURGERY Right 3/16   ANTERIOR CERVICAL DECOMP/DISCECTOMY FUSION N/A 03/11/2016   Procedure: C5-6, C6-7 Anterior Cervical Discectomy and Fusion, Allograft, Plate;  Surgeon: Marybelle Killings, MD;  Location: [place];  Service: Orthopedics;  Laterality: N/A;   CHOLECYSTECTOMY  2014   COLONOSCOPY WITH PROPOFOL N/A 08/25/2017   Procedure: COLONOSCOPY WITH PROPOFOL;  Surgeon: Manya Silvas, MD;  Location: [facility] ENDOSCOPY;  Service: Endoscopy;  Laterality: N/A;   CYSTO WITH HYDRODISTENSION N/A 01/26/2014   Procedure: CYSTOSCOPY/HYDRODISTENSION;  Surgeon: Bernestine Amass, MD;  Location: [facility];  Service: Urology;  Laterality: N/A;   CYSTO/  URETHRAL DILATION/  HYDRODISTENTION/  INSTILLATION THERAPY  07-16-2010//   12-30-2007//   10-27-2006   ELBOW SURGERY Right    EXERCISE TOLERENCE TEST  10-12-2010   NEGATIVE  ADEQUATE ETT/  NO ISCHEMIA OR EVIDENCE HIGH GRADE OBSTRUCTIVE CAD/  NO FURTHER TEST NEEDED   HYSTEROSCOPY W/  [place] ENDOMETRIAL ABLATION  2014   LAPAROSCOPIC OVARIAN CYST BX  2005   AND  URETEROSCOPIC LASER LITHO  STONE EXTRACTION   SHOULDER OPEN ROTATOR CUFF REPAIR Right 2004   TONSILLECTOMY     TRANSTHORACIC ECHOCARDIOGRAM  06-07-2006   normal study/  ef 60-65%   TUBAL LIGATION Bilateral 1995   Past Medical History:  Diagnosis Date   Anxiety    Anxiety disorder    Arthritis    Blepharitis    Chronic low back pain    Chronic pain    Depression    Diabetes mellitus without complication (HCC)    Dyslipidemia    Family history of blood clots    Fibromyalgia    GERD (gastroesophageal reflux disease)    Headache    hx migraines   History of kidney stones    History of panic attacks    History of renal calculi    IBS (irritable bowel syndrome)    Interstitial cystitis    Lupus ([place])    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 dr to do further tests   Menorrhagia    OSA on CPAP    not used cpap for  several weeks   Pneumonia    hx   PONV (postoperative nausea and vomiting)    RLS (restless legs syndrome)    Rosacea    Seasonal asthma    SI (sacroiliac) joint dysfunction    SUI (stress urinary incontinence, female)    UTI (lower urinary tract infection)    hx   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 02/23/2013   Ht '5\' 3"'$  (1.6 m)   BMI 40.39 kg/m   Opioid Risk Score:   Fall Risk Score:  `1  Depression screen [facility] 2/9     05/08/2022    9:58 AM 04/10/2022    9:35 AM 03/13/2022    9:52 AM 02/26/2022    9:25 AM 02/11/2022   11:23 AM 01/14/2022    9:30 AM 12/14/2021    1:03 PM  Depression screen PHQ 2/9  Decreased Interest 0 0 0 0 0 0 1  Down, Depressed, Hopeless 0 0 0 0 0 0 1  PHQ - 2 Score 0 0 0 0 0 0 2    Review of Systems  Musculoskeletal:  Positive for arthralgias and back pain.       Pain all over the body  All other systems reviewed and are negative.      Objective:   Physical Exam Gen: no distress, normal appearing, weight 229 lbs, BMI 40.57 HEENT: oral mucosa pink and moist, NCAT Cardio: Reg rate Chest: normal effort, normal rate of breathing Abd: soft, non-distended Ext: no edema Psych: pleasant, normal affect Skin: intact Neuro: Alert and oriented x3    Assessment & Plan:  1. Lumbago/ Lumbar Spondylosis/ Left Lumbar Radiculitis: Continue to Monitor. 09/02/202. Refilled HYDROcodone 10/'325mg'$  one tablet every 8 hours as needed #90. Discussed that MRI results show spondylosis that is stable from prior. Continue using muscle rub. Refilled Lidocaine patches.   -discussed weaning off if she can tolerate.  -discussed risks and benefits of bracing. Prescribed lumboscaral brace with the goal of reducing pain medication use and improving mobility.   -  prescribed Zynex Tens unit  -discussed mechanism of action of low dose naltrexone as an opioid receptor antagonist which stimulates your body's production of its own natural endogenous opioids, helping to decrease  pain. Discussed that it can also decrease T cell response and thus be helpful in decreasing inflammation, and symptoms of brain fog, fatigue, anxiety, depression, and allergies. Discussed that this medication needs to be compounded at a compounding pharmacy and can more expensive. Discussed that I usually start at '1mg'$  and if this is not providing enough relief then I titrate upward on a monthly basis.    -UDS today  -Discussed her worsening pain with the cold weather  -refilled lidocaine patches  2. Fibromyalgia. Continue Current exercise Regime. 05/25/2021. Discussed aqua therapy.  -recommended infrared light therapy -ordered Zynex Medical heating blanket for 90 days, discussed the benefits of heat in improving blood circulation.  -discussed use of TENs unit  3. Anxiety and depression:Psychiatry Following: Counselor Jessica  at [facility] . Continue Counseling at [facility]. 05/25/2021  4. Migraines: On Maxalt. Neurology Following. 05/25/2021. Start Bioptemizers magnesium.   5. OSA : PCP Following. Continue to Monitor. 05/25/2021  6. Obesity:   -BMI 40.57, weight 229 lbs, commended on 2 lbs weight loss! -continue to avoid bread -continue 2 TB of saurkraut every morning  -recommended to use her krill oil, Vitamin D, [place], magnesium breakthrough  -encouraged dandelion tea  -discussed wormwood/walnut oil cleanse -recommended nattokinase -encouraged endocrinology follow-up -counseled regarding the benefits of intermittent fasting and improving insulin sensitivity. -Educated that current weight is 225 lbs and current BMI is 40.00 -Educated regarding health benefits of weight loss- for pain, general health, chronic disease prevention, immune health, mental health.  -Will monitor weight every visit.  -Continue Ro[place]ois tea daily. -Discussed the benefits of intermittent fasting. Continue this. Discussed that 30 hour fasts are healthy.  -Discussed foods that can assist in weight  loss: 1) leafy greens- high in fiber and nutrients 2) dark chocolate- improves metabolism (if prefer sweetened, best to sweeten with honey instead of sugar).  3) cruciferous vegetables- high in fiber and protein 4) full fat yogurt: high in healthy fat, protein, calcium, and probiotics 5) apples- high in a variety of phytochemicals 6) nuts- high in fiber and protein that increase feelings of fullness 7) grapefruit: rich in nutrients, antioxidants, and fiber (not to be taken with anticoagulation) 8) beans- high in protein and fiber 9) salmon- has high quality protein and healthy fats 10) green tea- rich in polyphenols 11) eggs- rich in choline and vitamin D 12) tuna- high protein, boosts metabolism 13) avocado- decreases visceral abdominal fat 14) chicken (pasture raised): high in protein and iron 15) blueberries- reduce abdominal fat and cholesterol 16) whole grains- decreases calories retained during digestion, speeds metabolism 17) chia seeds- curb appetite 18) chilies- increases fat metabolism  -Discussed supplements that can be used:  1) Metatrim '400mg'$  BID 30 minutes before breakfast and dinner  2) Sphaeranthus indicus and Garcinia mangostana (combinations of these and #1 can be found in capsicum and zychrome  3) green coffee bean extract '400mg'$  twice per day or Irvingia (african mango) 150 to '300mg'$  twice per day. Teas: Green tea Black tea Oolong tea White tea [place] of Tea, [place] Continue to avoid added sugar. Continue using honey or monk fruit as sweeteners.   7. Status Post Cervical Spinal Fusion: C5C6- C-6- C-7 Anterior Cervical Discectomy Fusion and Allograft Plate: Dr. Lorin Mercy Following. 05/25/2021  8. Cervicalgia/ Cervical Radiculitis/ S/P Cervical Spinal Fusion:  Continue to Monitor Dr. Lorin Mercy Following. 05/25/2021  9. 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 05/25/2021.  11.Right    Greater Trochanteric Bursitis: . Continue to Alternate Ice and heat Therapy. Continue to Monitor. 05/25/2021.  12. Chronic Bilateral Knee Pain: No Complaints Today. Continue HEP as Tolerated. Continue to Monitor. 05/25/2021  13. Lateral Epicondylitis of Right Elbow: No complaints today.  Ortho Following. S/P   06/26/2020 Right lateral epicondyle debridement, drilling and repair  By Dr Lorin Mercy. 14. Neuralgia/ Right Facial Droop: Neurology following. Continue to monitor. 05/25/2021.  15. Polyarthralgia: No complaints today. Continue HEP as tolerated. Continue to monitor.   16. Nausea: discussed trying an elimination diet avoiding gluten, dairy, and eggs for 4 weeks.   17. Right lower extremity pain: vascular ultrasound ordered to assess for clot.   18. Abdominal cramps -recommended saffron -recommended to follow-up with OB/GYN  19. Rib pain: -recommended trying lidocaine patch  20. Left knee: potential meniscal tear: discusses getting MRI -discussed benefits of infrared light therapy.  -discussed response to steroid shot- was quite painful but benefitted her afterward.   21. Brain Fog -discussed speech therapy -discussed her full 6 hour neuropsych testing at [facility] and was told overall she did well but there were some domains in which she stayed low -continue CPAP -continue to stay well hydrated.  -discussed low dose naltrexone  22. Interstitial cystitis -discussed that UA is normal, discussed medication treatment options for interstitial cystitis and sent her a link with more information. -discussed pentosan polysulfate sodium as a treatment to help restore the inner surface of the bladder.

## 2022-07-01 NOTE — Patient Instructions (Signed)
Infrared lamp at home [place]

## 2022-07-01 NOTE — Addendum Note (Signed)
Addended by: Izora Ribas on: 07/01/2022 10:30 AM   Modules accepted: Orders

## 2022-07-01 NOTE — Addendum Note (Signed)
Addended by: Izora Ribas on: 07/01/2022 10:49 AM   Modules accepted: Orders

## 2022-07-01 NOTE — Addendum Note (Signed)
Addended by: Izora Ribas on: 07/01/2022 10:33 AM   Modules accepted: Orders

## 2022-07-02 ENCOUNTER — Ambulatory Visit: Payer: Medicare Other

## 2022-07-03 LAB — TOXASSURE SELECT,+ANTIDEPR,UR

## 2022-07-04 ENCOUNTER — Telehealth: Payer: Self-pay | Admitting: *Deleted

## 2022-07-04 ENCOUNTER — Other Ambulatory Visit (HOSPITAL_COMMUNITY): Payer: Self-pay

## 2022-07-04 NOTE — Telephone Encounter (Signed)
Prior auth submitted for hydrocodone 10/325 to [place]. Confirmation #:[id number] W

## 2022-07-04 NOTE — Telephone Encounter (Signed)
PA submitted to [facility] for Lidocaine 5% patches. Confirmation #:[id number] W

## 2022-07-09 ENCOUNTER — Telehealth: Payer: Self-pay | Admitting: *Deleted

## 2022-07-09 ENCOUNTER — Other Ambulatory Visit (HOSPITAL_COMMUNITY): Payer: Self-pay

## 2022-07-09 NOTE — Telephone Encounter (Signed)
Urine drug screen for this encounter is consistent for prescribed medication 

## 2022-07-10 ENCOUNTER — Ambulatory Visit (INDEPENDENT_AMBULATORY_CARE_PROVIDER_SITE_OTHER): Payer: Medicare Other

## 2022-07-10 DIAGNOSIS — G4733 Obstructive sleep apnea (adult) (pediatric): Secondary | ICD-10-CM

## 2022-07-10 NOTE — Progress Notes (Unsigned)
95 percentile pressure 9   95th percentile leak 1.1   apnea index 0.1 /hr  apnea-hypopnea index  0.1 /hr   total days used  >4 hr 14 days  total days used <4 hr 16 days  Total compliance 47 percent  She is trying so much better since receiving an airtouch n-20, she will continue to use  Pt was seen by [employer]  RRT/RCP  from [employer]

## 2022-07-11 ENCOUNTER — Other Ambulatory Visit (HOSPITAL_COMMUNITY): Payer: Self-pay

## 2022-07-22 ENCOUNTER — Other Ambulatory Visit (HOSPITAL_COMMUNITY): Payer: Self-pay

## 2022-07-30 ENCOUNTER — Other Ambulatory Visit (HOSPITAL_COMMUNITY): Payer: Self-pay

## 2022-07-30 ENCOUNTER — Ambulatory Visit
Admission: RE | Admit: 2022-07-30 | Discharge: 2022-07-30 | Disposition: A | Discharge status: Home / Self Care | Payer: Medicare Other | Source: Ambulatory Visit | Attending: Registered Nurse | Admitting: Registered Nurse

## 2022-07-30 ENCOUNTER — Encounter: Payer: Self-pay | Admitting: Registered Nurse

## 2022-07-30 ENCOUNTER — Encounter: Payer: Medicare Other | Attending: Physical Medicine & Rehabilitation | Admitting: Registered Nurse

## 2022-07-30 VITALS — BP 138/77 | HR 85 | Ht 63.0 in | Wt 227.0 lb

## 2022-07-30 DIAGNOSIS — Z5181 Encounter for therapeutic drug level monitoring: Secondary | ICD-10-CM

## 2022-07-30 DIAGNOSIS — G4733 Obstructive sleep apnea (adult) (pediatric): Secondary | ICD-10-CM | POA: Insufficient documentation

## 2022-07-30 DIAGNOSIS — M5412 Radiculopathy, cervical region: Secondary | ICD-10-CM | POA: Diagnosis not present

## 2022-07-30 DIAGNOSIS — G8929 Other chronic pain: Secondary | ICD-10-CM | POA: Diagnosis not present

## 2022-07-30 DIAGNOSIS — F32A Depression, unspecified: Secondary | ICD-10-CM | POA: Diagnosis not present

## 2022-07-30 DIAGNOSIS — Y92009 Unspecified place in unspecified non-institutional (private) residence as the place of occurrence of the external cause: Secondary | ICD-10-CM | POA: Diagnosis not present

## 2022-07-30 DIAGNOSIS — M7062 Trochanteric bursitis, left hip: Secondary | ICD-10-CM | POA: Diagnosis not present

## 2022-07-30 DIAGNOSIS — F419 Anxiety disorder, unspecified: Secondary | ICD-10-CM | POA: Diagnosis not present

## 2022-07-30 DIAGNOSIS — Z79891 Long term (current) use of opiate analgesic: Secondary | ICD-10-CM

## 2022-07-30 DIAGNOSIS — E669 Obesity, unspecified: Secondary | ICD-10-CM | POA: Diagnosis not present

## 2022-07-30 DIAGNOSIS — M62838 Other muscle spasm: Secondary | ICD-10-CM | POA: Insufficient documentation

## 2022-07-30 DIAGNOSIS — M5416 Radiculopathy, lumbar region: Secondary | ICD-10-CM | POA: Diagnosis not present

## 2022-07-30 DIAGNOSIS — R0781 Pleurodynia: Secondary | ICD-10-CM | POA: Diagnosis not present

## 2022-07-30 DIAGNOSIS — Z981 Arthrodesis status: Secondary | ICD-10-CM | POA: Diagnosis not present

## 2022-07-30 DIAGNOSIS — M797 Fibromyalgia: Secondary | ICD-10-CM

## 2022-07-30 DIAGNOSIS — G43909 Migraine, unspecified, not intractable, without status migrainosus: Secondary | ICD-10-CM | POA: Insufficient documentation

## 2022-07-30 DIAGNOSIS — M25562 Pain in left knee: Secondary | ICD-10-CM | POA: Insufficient documentation

## 2022-07-30 DIAGNOSIS — M546 Pain in thoracic spine: Secondary | ICD-10-CM | POA: Insufficient documentation

## 2022-07-30 DIAGNOSIS — M7711 Lateral epicondylitis, right elbow: Secondary | ICD-10-CM | POA: Insufficient documentation

## 2022-07-30 DIAGNOSIS — M542 Cervicalgia: Secondary | ICD-10-CM

## 2022-07-30 DIAGNOSIS — G894 Chronic pain syndrome: Secondary | ICD-10-CM

## 2022-07-30 DIAGNOSIS — Z760 Encounter for issue of repeat prescription: Secondary | ICD-10-CM | POA: Diagnosis not present

## 2022-07-30 DIAGNOSIS — Z6841 Body Mass Index (BMI) 40.0 and over, adult: Secondary | ICD-10-CM | POA: Insufficient documentation

## 2022-07-30 DIAGNOSIS — M25561 Pain in right knee: Secondary | ICD-10-CM | POA: Diagnosis not present

## 2022-07-30 DIAGNOSIS — M2550 Pain in unspecified joint: Secondary | ICD-10-CM

## 2022-07-30 DIAGNOSIS — W19XXXD Unspecified fall, subsequent encounter: Secondary | ICD-10-CM

## 2022-07-30 DIAGNOSIS — G609 Hereditary and idiopathic neuropathy, unspecified: Secondary | ICD-10-CM | POA: Diagnosis not present

## 2022-07-30 DIAGNOSIS — R0789 Other chest pain: Secondary | ICD-10-CM

## 2022-07-30 MED ORDER — HYDROCODONE-ACETAMINOPHEN 10-325 MG PO TABS
1.0000 | ORAL_TABLET | Freq: Three times a day (TID) | ORAL | 0 refills | Status: DC | PRN
  Filled 2022-07-30 – 2022-08-02 (×2): qty 90, 30d supply, fill #0
  Filled ????-??-??: fill #0

## 2022-07-30 NOTE — Progress Notes (Deleted)
   Subjective:    Patient ID: Karina Greer, female    DOB: 03-10-1966, 56 y.o.   MRN: [id number]  HPI   .cpr Review of Systems     Objective:   Physical Exam        Assessment & Plan:

## 2022-07-30 NOTE — Progress Notes (Signed)
Subjective:    Patient ID: Karina Greer, female    DOB: [birth year], 56 y.o.   MRN: [id number]  HPI: Karina Greer is a 56 y.o. female who returns for follow up appointment for chronic pain and medication refill. She states her  pain is located in her neck radiating into her bilateral shoulders, mid- lower back pain radiating into her bilateral lower extremities, and left hip pain.She rates her pain 6. Her current exercise regime is walking short distances.   Ms. Sorrels states she fell yesterday she was walking to her bathroom when her left knee gave way and she fell against the edge of the couch. She was able to pick herself up, she didn't seek medical attention. She also reports left rib pain X-ray ordered, she verbalizes understanding. Educated on falls prevention, she verbalizes understanding.   Ms. Turcott Morphine equivalent is 30.00 MME.   Last UDS was Performed on 07/01/2022, it was consistent.      Pain Inventory Average Pain 9 Pain Right Now 6 My pain is constant, sharp, tingling, and aching  In the last 24 hours, has pain interfered with the following? General activity 4 Relation with others 6 Enjoyment of life 4 What TIME of day is your pain at its worst? morning , daytime, evening, and night Sleep (in general) Fair  Pain is worse with: walking, bending, sitting, inactivity, standing, unsure, and some activites Pain improves with: rest, heat/ice, therapy/exercise, pacing activities, medication, and TENS Relief from Meds: 7  Family History  Problem Relation Age of Onset   Hypertension Mother    Diabetes Father    Brain cancer Father    Clotting disorder Father        blood clots   Fibromyalgia Sister    Suicidality Sister    Hypertension Other        Cancer, Cerebrovascular disease run on mother side of family   Social History   Socioeconomic History   Marital status: Divorced    Spouse name: Not on file   Number of children: 3   Years of education: HS    Highest education level: 12th grade  Occupational History   Occupation: Merchandiser, retail: UNEMPLOYED    Comment: Disability  Tobacco Use   Smoking status: Never   Smokeless tobacco: Never  Vaping Use   Vaping Use: Never used  Substance and Sexual Activity   Alcohol use: No   Drug use: No   Sexual activity: Not on file  Other Topics Concern   Not on file  Social History Narrative   Patient is right-handed. She avoids caffeine. She has recently been using the treadmill.   Social Determinants of Health   Financial Resource Strain: Not on file  Food Insecurity: Not on file  Transportation Needs: Not on file  Physical Activity: Not on file  Stress: Not on file  Social Connections: Not on file   Past Surgical History:  Procedure Laterality Date   ACHILLES TENDON SURGERY Right 3/16   ANTERIOR CERVICAL DECOMP/DISCECTOMY FUSION N/A 03/11/2016   Procedure: C5-6, C6-7 Anterior Cervical Discectomy and Fusion, Allograft, Plate;  Surgeon: Marybelle Killings, MD;  Location: [facility];  Service: Orthopedics;  Laterality: N/A;   CHOLECYSTECTOMY  2014   COLONOSCOPY WITH PROPOFOL N/A 08/25/2017   Procedure: COLONOSCOPY WITH PROPOFOL;  Surgeon: Manya Silvas, MD;  Location: [facility] ENDOSCOPY;  Service: Endoscopy;  Laterality: N/A;   CYSTO WITH HYDRODISTENSION N/A 01/26/2014   Procedure: CYSTOSCOPY/HYDRODISTENSION;  Surgeon: Shanon Brow  Cy Blamer, MD;  Location: [facility];  Service: Urology;  Laterality: N/A;   CYSTO/  URETHRAL DILATION/  HYDRODISTENTION/   INSTILLATION THERAPY  07-16-2010//   12-30-2007//   10-27-2006   ELBOW SURGERY Right    EXERCISE TOLERENCE TEST  10-12-2010   NEGATIVE  ADEQUATE ETT/  NO ISCHEMIA OR EVIDENCE HIGH GRADE OBSTRUCTIVE CAD/  NO FURTHER TEST NEEDED   HYSTEROSCOPY W/  NOVASURE ENDOMETRIAL ABLATION  2014   LAPAROSCOPIC OVARIAN CYST BX  2005   AND  URETEROSCOPIC LASER LITHO  STONE EXTRACTION   SHOULDER OPEN ROTATOR CUFF REPAIR Right 2004   TONSILLECTOMY      TRANSTHORACIC ECHOCARDIOGRAM  06-07-2006   normal study/  ef 60-65%   TUBAL LIGATION Bilateral 1995   Past Surgical History:  Procedure Laterality Date   ACHILLES TENDON SURGERY Right 3/16   ANTERIOR CERVICAL DECOMP/DISCECTOMY FUSION N/A 03/11/2016   Procedure: C5-6, C6-7 Anterior Cervical Discectomy and Fusion, Allograft, Plate;  Surgeon: Marybelle Killings, MD;  Location: [facility];  Service: Orthopedics;  Laterality: N/A;   CHOLECYSTECTOMY  2014   COLONOSCOPY WITH PROPOFOL N/A 08/25/2017   Procedure: COLONOSCOPY WITH PROPOFOL;  Surgeon: Manya Silvas, MD;  Location: [facility] ENDOSCOPY;  Service: Endoscopy;  Laterality: N/A;   CYSTO WITH HYDRODISTENSION N/A 01/26/2014   Procedure: CYSTOSCOPY/HYDRODISTENSION;  Surgeon: Bernestine Amass, MD;  Location: [facility];  Service: Urology;  Laterality: N/A;   CYSTO/  URETHRAL DILATION/  HYDRODISTENTION/   INSTILLATION THERAPY  07-16-2010//   12-30-2007//   10-27-2006   ELBOW SURGERY Right    EXERCISE TOLERENCE TEST  10-12-2010   NEGATIVE  ADEQUATE ETT/  NO ISCHEMIA OR EVIDENCE HIGH GRADE OBSTRUCTIVE CAD/  NO FURTHER TEST NEEDED   HYSTEROSCOPY W/  [place] ENDOMETRIAL ABLATION  2014   LAPAROSCOPIC OVARIAN CYST BX  2005   AND  URETEROSCOPIC LASER LITHO  STONE EXTRACTION   SHOULDER OPEN ROTATOR CUFF REPAIR Right 2004   TONSILLECTOMY     TRANSTHORACIC ECHOCARDIOGRAM  06-07-2006   normal study/  ef 60-65%   TUBAL LIGATION Bilateral 1995   Past Medical History:  Diagnosis Date   Anxiety    Anxiety disorder    Arthritis    Blepharitis    Chronic low back pain    Chronic pain    Depression    Diabetes mellitus without complication (HCC)    Dyslipidemia    Family history of blood clots    Fibromyalgia    GERD (gastroesophageal reflux disease)    Headache    hx migraines   History of kidney stones    History of panic attacks    History of renal calculi    IBS (irritable bowel syndrome)    Interstitial cystitis    Lupus ([place])    tested  positive for antibodies for lupus. 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   Wt 227 lb (103 kg)   BMI 40.21 kg/m   Opioid Risk Score:   Fall Risk Score:  `1  Depression screen [facility] 2/9     07/01/2022   10:02 AM 05/08/2022    9:58 AM 04/10/2022    9:35 AM 03/13/2022    9:52 AM 02/26/2022    9:25 AM 02/11/2022   11:23 AM 01/14/2022    9:30 AM  Depression screen PHQ 2/9  Decreased Interest 0 0 0 0 0 0 0  Down, Depressed, Hopeless 0 0 0 0 0 0 0  PHQ - 2 Score 0 0 0 0 0 0 0    Review of Systems  Musculoskeletal:  Positive for arthralgias and back pain.       Pain all over      Objective:   Physical Exam Vitals and nursing note reviewed.  Constitutional:      Appearance: Normal appearance.  Cardiovascular:     Rate and Rhythm: Normal rate and regular rhythm.     Pulses: Normal pulses.     Heart sounds: Normal heart sounds.  Pulmonary:     Effort: Pulmonary effort is normal.     Breath sounds: Normal breath sounds.  Musculoskeletal:     Cervical back: Normal range of motion and neck supple.     Comments: Normal Muscle Bulk and Muscle Testing Reveals:  Upper Extremities: Full ROM and Muscle Strength 5/5  Thoracic, Paraspinal Tenderness: T-4-T-7 Mainly Left Side Lumbar Paraspinal Tenderness: L-4-L-5 Left greater Trochanter Tenderness Lower Extremities: Full ROM and Muscle Strength 5/5 Arises from Chair slowly Narrow Based  Gait     Skin:    General: Skin is warm and dry.  Neurological:     Mental Status: She is alert and oriented to person, place, and time.  Psychiatric:        Mood and Affect: Mood normal.         Behavior: Behavior normal.         Assessment & Plan:  1. Lumbago/ Lumbar Spondylosis/ Left Lumbar Radiculitis: Continue to Monitor. 11/07203 Refilled: HYDROcodone 10/'325mg'$  one tablet every 8 hours as needed #90.  We will continue the opioid monitoring program, this consists of regular clinic visits, examinations, urine drug screen, pill counts as well as use of [place] Controlled Substance Reporting system. A 12 month History has been reviewed on the [place] on 07/30/2022. 2. Fibromyalgia. Continue Current exercise Regime. 07/30/2022 3. Anxiety and depression:Psychiatry Following: Counselor Jessica  at the [facility] . Continue Counseling at [facility]. 07/30/2022 4. Migraines: On Maxalt. Neurology Following. 07/30/2022 5. OSA : PCP Following. Continue to Monitor. 07/30/2022 6. Obesity: Dr Ranell Patrick Following: Continue  Healthy Diet Regimen and Continue HEP as Tolerated. 07/30/2022 7. Status Post Cervical Spinal Fusion: C5C6- C-6- C-7 Anterior Cervical Discectomy Fusion and Allograft Plate: Dr. Lorin Mercy Following. 11/107/2023 8. Cervicalgia/ Cervical Radiculitis/ S/P Cervical Spinal Fusion:  Continue to Monitor Dr. Lorin Mercy Following. 07/30/2022 9. Muscle Spasm: Continue Tizanidine as needed.07/30/2022 10.Hereditary and Idiopathic Peripheral Neuropathy Continue with Tens Unit. 07/30/2022. 11.Left Greater Trochanteric Bursitis: . Continue to Alternate Ice and heat Therapy. Continue to Monitor. 07/30/2022. 12. Chronic Bilateral Knee Pain: No Complaints Today. Ortho Following. Continue HEP as Tolerated. Continue to Monitor. 07/30/2022 13. Lateral Epicondylitis of Right Elbow: No complaints today.  Ortho Following. S/P   06/26/2020 Right lateral epicondyle debridement, drilling and repair  By Dr Lorin Mercy. 14. Neuralgia/ Right Facial Droop: Neurology following. Continue to monitor. 07/30/2022. 15. Polyarthralgia: No complaints today. Continue HEP  as tolerated. Continue to monitor. 07/30/2022  16. Paresthesia of Right Lower Extremity: Occasionally: Continue current  medication regimen. Continue to monitor. 07/30/2022 17. Fall at Home: Educated on falls prevention she verbalizes understanding.  18. Left Rib Pain S/P Fall: RX X-ray   F/U in 1 month .

## 2022-08-02 ENCOUNTER — Other Ambulatory Visit (HOSPITAL_COMMUNITY): Payer: Self-pay

## 2022-08-06 ENCOUNTER — Ambulatory Visit (INDEPENDENT_AMBULATORY_CARE_PROVIDER_SITE_OTHER): Payer: Medicare Other

## 2022-08-06 DIAGNOSIS — J301 Allergic rhinitis due to pollen: Secondary | ICD-10-CM

## 2022-08-14 ENCOUNTER — Other Ambulatory Visit: Payer: Self-pay | Admitting: Podiatry

## 2022-08-27 ENCOUNTER — Ambulatory Visit: Payer: Medicare Other | Admitting: Physical Medicine and Rehabilitation

## 2022-08-28 ENCOUNTER — Ambulatory Visit: Payer: Medicare Other

## 2022-08-29 DIAGNOSIS — E119 Type 2 diabetes mellitus without complications: Secondary | ICD-10-CM | POA: Diagnosis not present

## 2022-09-03 ENCOUNTER — Ambulatory Visit: Payer: Medicare Other

## 2022-09-03 ENCOUNTER — Encounter: Payer: Self-pay | Admitting: Physical Medicine and Rehabilitation

## 2022-09-03 ENCOUNTER — Other Ambulatory Visit (HOSPITAL_COMMUNITY): Payer: Self-pay

## 2022-09-03 ENCOUNTER — Encounter: Payer: Medicare Other | Attending: Physical Medicine & Rehabilitation | Admitting: Physical Medicine and Rehabilitation

## 2022-09-03 VITALS — BP 125/86 | HR 77 | Ht 63.0 in | Wt 225.0 lb

## 2022-09-03 DIAGNOSIS — M797 Fibromyalgia: Secondary | ICD-10-CM | POA: Diagnosis not present

## 2022-09-03 DIAGNOSIS — Z6839 Body mass index (BMI) 39.0-39.9, adult: Secondary | ICD-10-CM | POA: Diagnosis not present

## 2022-09-03 DIAGNOSIS — F909 Attention-deficit hyperactivity disorder, unspecified type: Secondary | ICD-10-CM | POA: Diagnosis not present

## 2022-09-03 DIAGNOSIS — E782 Mixed hyperlipidemia: Secondary | ICD-10-CM | POA: Insufficient documentation

## 2022-09-03 DIAGNOSIS — G500 Trigeminal neuralgia: Secondary | ICD-10-CM | POA: Diagnosis not present

## 2022-09-03 DIAGNOSIS — Z1589 Genetic susceptibility to other disease: Secondary | ICD-10-CM | POA: Insufficient documentation

## 2022-09-03 MED ORDER — LIDOCAINE 5 % EX PTCH
1.0000 | MEDICATED_PATCH | CUTANEOUS | 3 refills | Status: DC
  Filled 2022-09-03: qty 33, 33d supply, fill #0
  Filled 2022-09-17: qty 57, 57d supply, fill #0
  Filled 2023-05-30: qty 90, 90d supply, fill #1

## 2022-09-03 MED ORDER — HYDROCODONE-ACETAMINOPHEN 10-325 MG PO TABS
1.0000 | ORAL_TABLET | Freq: Three times a day (TID) | ORAL | 0 refills | Status: DC | PRN
  Filled 2022-09-03: qty 90, 30d supply, fill #0

## 2022-09-03 NOTE — Patient Instructions (Signed)
Metanx  I-methyl-folate

## 2022-09-03 NOTE — Addendum Note (Signed)
Addended by: Izora Ribas on: 09/03/2022 01:19 PM   Modules accepted: Orders

## 2022-09-03 NOTE — Progress Notes (Addendum)
Subjective:    Patient ID: Karina Greer, female    DOB: 05-15-1966, 56 y.o.   MRN: [id number]   Karina Greer is a 56 y.o. female who presents for follow-up of fibromyalgia and interstitial cystitis.   1) Interstitial cystitis -she has been having burning when she urinates -she got UA done yesterday, discussed results with her today  2) Diffuse chronic pain: back pain and diffuse in joints and muscles -her back has been more stiff recently -cold weather worsens her symptoms -she tried naltrexone, she felt like it made her sick, she feels nauseous every day so it wasn't that- it was more flulike symptoms. This was with the '1mg'$  of naltrexone. She did not take opioid that day.  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 Sometimes she does not eat until lunch. She has been trying to do the fasting we discussed last visit.  -Her son is 278 pounds and 6 foot tall. He went to 242 lbs following a similar diet.  -Her dietary goals are to make use of what she eats.  -she has been eating a lot of salads.  -she cannot handle hard cheeses.  -she has been having a problem eating hamburger.  -Last night she made four pieces of chicken and put on some tomato sauce and red peppers, she sprinkled some cheese, added broccoli, olive oil, garlic, butter.  -she is planned to have her HgbA1c and lipid panel drawn by her PCP next week.  -has been having low back pain, MRI ordered and shows stable spondylosis from prior -she feels better when there  is no food in her stomach -she got organic oranges from a farm and is going to make her own orange juice -she bought the Bioptemizer's magnesium so will start this soon -she is having a lot of pain in her neck and feels this is from the weather and her surgery -her calf is hurting a lot. She has not had an ultrasound. -blood clots run in her family.   -she bought [employer], Engineering geologist, magnesium breakthrough, Krill oil, Spirulina, Coenzyme Q10 -she saw an endocrinologist and she did a swab test -she went today for bloodwork to check her potassium, cortisol to look for Cushing's Disease -she feels like her hormones are off balance.  -she feels that she has all the symptoms of Cushing's Disease -she was told her rheum panel is normal.  -she was told she has a meniscal tear by ortho   3) Trigeminal neuralgia bilateral: -she said a procedure on her left side with good effect -she tried carbamezepine and it made her stare into outer space  4) Obesity -she is down to 225 lbs!  Pain Inventory Average Pain 8 Pain Right Now 6 My pain is constant, sharp, burning, dull, stabbing, and tingling  In the last 24 hours, has pain interfered with the following? General activity 5 Relation with others 5 Enjoyment of life 3 What TIME of day is your pain at its  worst? morning , daytime, and night Sleep (in general) Poor  Pain is worse with: walking, bending, sitting, inactivity, standing, unsure, and some activites Pain improves with: rest, heat/ice, therapy/exercise, pacing activities, medication, TENS, and injections Relief from Meds: 7  Family History  Problem Relation Age of Onset   Hypertension Mother    Diabetes Father    Brain cancer Father    Clotting disorder Father        blood clots   Fibromyalgia Sister    Suicidality Sister    Hypertension Other        Cancer, Cerebrovascular disease run on mother side of family   Social History   Socioeconomic History   Marital status:  Divorced    Spouse name: Not on file   Number of children: 3   Years of education: HS   Highest education level: 12th grade  Occupational History   Occupation: Merchandiser, retail: UNEMPLOYED    Comment: Disability  Tobacco Use   Smoking status: Never   Smokeless tobacco: Never  Vaping Use   Vaping Use: Never used  Substance and Sexual Activity   Alcohol use: No   Drug use: No   Sexual activity: Not on file  Other Topics Concern   Not on file  Social History Narrative   Patient is right-handed. She avoids caffeine. She has recently been using the treadmill.   Social Determinants of Health   Financial Resource Strain: Not on file  Food Insecurity: Not on file  Transportation Needs: Not on file  Physical Activity: Not on file  Stress: Not on file  Social Connections: Not on file   Past Surgical History:  Procedure Laterality Date   ACHILLES TENDON SURGERY Right 3/16   ANTERIOR CERVICAL DECOMP/DISCECTOMY FUSION N/A 03/11/2016   Procedure: C5-6, C6-7 Anterior Cervical Discectomy and Fusion, Allograft, Plate;  Surgeon: Marybelle Killings, MD;  Location: [place];  Service: Orthopedics;  Laterality: N/A;   CHOLECYSTECTOMY  2014   COLONOSCOPY WITH PROPOFOL N/A 08/25/2017   Procedure: COLONOSCOPY WITH PROPOFOL;  Surgeon: Manya Silvas, MD;  Location: [facility] ENDOSCOPY;  Service: Endoscopy;  Laterality: N/A;   CYSTO WITH HYDRODISTENSION N/A 01/26/2014   Procedure: CYSTOSCOPY/HYDRODISTENSION;  Surgeon: Bernestine Amass, MD;  Location: [facility];  Service: Urology;  Laterality: N/A;   CYSTO/  URETHRAL DILATION/  HYDRODISTENTION/   INSTILLATION THERAPY  07-16-2010//   12-30-2007//   10-27-2006   ELBOW SURGERY Right    EXERCISE TOLERENCE TEST  10-12-2010   NEGATIVE  ADEQUATE ETT/  NO ISCHEMIA OR EVIDENCE HIGH GRADE OBSTRUCTIVE CAD/  NO FURTHER TEST NEEDED   HYSTEROSCOPY W/  NOVASURE ENDOMETRIAL ABLATION  2014   LAPAROSCOPIC OVARIAN CYST BX  2005   AND  URETEROSCOPIC LASER  LITHO  STONE EXTRACTION   SHOULDER OPEN ROTATOR CUFF REPAIR Right 2004   TONSILLECTOMY     TRANSTHORACIC ECHOCARDIOGRAM  06-07-2006   normal study/  ef 60-65%   TUBAL LIGATION Bilateral 1995   Past Surgical History:  Procedure Laterality Date   ACHILLES TENDON SURGERY Right 3/16   ANTERIOR CERVICAL DECOMP/DISCECTOMY FUSION N/A 03/11/2016   Procedure: C5-6, C6-7 Anterior Cervical Discectomy and Fusion, Allograft, Plate;  Surgeon: Marybelle Killings, MD;  Location: [place];  Service: Orthopedics;  Laterality: N/A;   CHOLECYSTECTOMY  2014   COLONOSCOPY WITH PROPOFOL N/A 08/25/2017   Procedure: COLONOSCOPY WITH PROPOFOL;  Surgeon: Manya Silvas, MD;  Location: [facility] ENDOSCOPY;  Service: Endoscopy;  Laterality: N/A;  CYSTO WITH HYDRODISTENSION N/A 01/26/2014   Procedure: CYSTOSCOPY/HYDRODISTENSION;  Surgeon: Bernestine Amass, MD;  Location[facility];  Service: Urology;  Laterality: N/A;   CYSTO/  URETHRAL DILATION/  HYDRODISTENTION/   INSTILLATION THERAPY  07-16-2010//   12-30-2007//   10-27-2006   ELBOW SURGERY Right    EXERCISE TOLERENCE TEST  10-12-2010   NEGATIVE  ADEQUATE ETT/  NO ISCHEMIA OR EVIDENCE HIGH GRADE OBSTRUCTIVE CAD/  NO FURTHER TEST NEEDED   HYSTEROSCOPY W/  [place] ENDOMETRIAL ABLATION  2014   LAPAROSCOPIC OVARIAN CYST BX  2005   AND  URETEROSCOPIC LASER LITHO  STONE EXTRACTION   SHOULDER OPEN ROTATOR CUFF REPAIR Right 2004   TONSILLECTOMY     TRANSTHORACIC ECHOCARDIOGRAM  06-07-2006   normal study/  ef 60-65%   TUBAL LIGATION Bilateral 1995   Past Medical History:  Diagnosis Date   Anxiety    Anxiety disorder    Arthritis    Blepharitis    Chronic low back pain    Chronic pain    Depression    Diabetes mellitus without complication (HCC)    Dyslipidemia    Family history of blood clots    Fibromyalgia    GERD (gastroesophageal reflux disease)    Headache    hx migraines   History of kidney stones    History of panic attacks    History of renal  calculi    IBS (irritable bowel syndrome)    Interstitial cystitis    Lupus ([place])    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   BP 125/86   Pulse 77   Ht '5\' 3"'$  (1.6 m)   Wt 225 lb (102.1 kg)   SpO2 96%   BMI 39.86 kg/m   Opioid Risk Score:   Fall Risk Score:  `1  Depression screen [facility] 2/9     09/03/2022   12:46 PM 07/30/2022   10:21 AM 07/01/2022   10:02 AM 05/08/2022    9:58 AM 04/10/2022    9:35 AM 03/13/2022    9:52 AM 02/26/2022    9:25 AM  Depression screen PHQ 2/9  Decreased Interest 0 1 0 0 0 0 0  Down, Depressed, Hopeless 0 1 0 0 0 0 0  PHQ - 2 Score 0 2 0 0 0 0 0    Review of Systems  Musculoskeletal:  Positive for arthralgias and back pain.       Pain all over the body  All other systems reviewed and are negative.      Objective:   Physical Exam Gen: no distress, normal appearing, weight 229 lbs, BMI 40.57 HEENT: oral mucosa pink and moist, NCAT Cardio: Reg rate Chest: normal effort, normal rate of breathing Abd: soft, non-distended Ext: no edema Psych: pleasant, normal affect Skin: intact Neuro: Alert and oriented x3    Assessment & Plan:  1. Lumbago/ Lumbar Spondylosis/ Left Lumbar Radiculitis: Continue to Monitor. 09/02/202. -Refilled HYDROcodone 10/'325mg'$  one tablet every 8  hours as needed #90. Discussed that MRI results show spondylosis that is stable from prior. Continue using muscle rub. Refilled Lidocaine patches.   -discussed weaning off if she can tolerate.  -discussed risks and benefits of bracing. Prescribed lumboscaral brace with the goal of  reducing pain medication use and improving mobility.   -prescribed Zynex Tens unit  -discussed mechanism of action of low dose naltrexone as an opioid receptor antagonist which stimulates your body's production of its own natural endogenous opioids, helping to decrease pain. Discussed that it can also decrease T cell response and thus be helpful in decreasing inflammation, and symptoms of brain fog, fatigue, anxiety, depression, and allergies. Discussed that this medication needs to be compounded at a compounding pharmacy and can more expensive. Discussed that I usually start at '1mg'$  and if this is not providing enough relief then I titrate upward on a monthly basis.    -UDS today  -Discussed her worsening pain with the cold weather  -refilled lidocaine patches  2. Fibromyalgia. Continue Current exercise Regime. 05/25/2021. Discussed aqua therapy.  -recommended infrared light therapy -ordered Zynex Medical heating blanket for 90 days, discussed the benefits of heat in improving blood circulation.  -discussed use of TENs unit  3. Anxiety and depression:Psychiatry Following: Counselor Jessica  at the [facility] . Continue Counseling at [facility]. 05/25/2021  4. Migraines: On Maxalt. Neurology Following. 05/25/2021. Start Bioptemizers magnesium.   5. OSA : PCP Following. Continue to Monitor. 05/25/2021  6. Obesity:   -commended on 6 lbs loss! -continue to avoid bread -check vitamin D level today.  -continue 2 TB of saurkraut every morning  -recommended to use her krill oil, Vitamin D, [place], magnesium breakthrough  -encouraged dandelion tea  -discussed wormwood/walnut oil cleanse -recommended nattokinase -encouraged endocrinology follow-up -counseled regarding the benefits of intermittent fasting and improving insulin sensitivity. -Educated that current weight is 225 lbs and current BMI is 40.00 -Educated regarding health benefits of weight loss- for pain, general health, chronic  disease prevention, immune health, mental health.  -Will monitor weight every visit.  -Continue Ro[place]ois tea daily. -Discussed the benefits of intermittent fasting. Continue this. Discussed that 30 hour fasts are healthy.  -Discussed foods that can assist in weight loss: 1) leafy greens- high in fiber and nutrients 2) dark chocolate- improves metabolism (if prefer sweetened, best to sweeten with honey instead of sugar).  3) cruciferous vegetables- high in fiber and protein 4) full fat yogurt: high in healthy fat, protein, calcium, and probiotics 5) apples- high in a variety of phytochemicals 6) nuts- high in fiber and protein that increase feelings of fullness 7) grapefruit: rich in nutrients, antioxidants, and fiber (not to be taken with anticoagulation) 8) beans- high in protein and fiber 9) salmon- has high quality protein and healthy fats 10) green tea- rich in polyphenols 11) eggs- rich in choline and vitamin D 12) tuna- high protein, boosts metabolism 13) avocado- decreases visceral abdominal fat 14) chicken (pasture raised): high in protein and iron 15) blueberries- reduce abdominal fat and cholesterol 16) whole grains- decreases calories retained during digestion, speeds metabolism 17) chia seeds- curb appetite 18) chilies- increases fat metabolism  -Discussed supplements that can be used:  1) Metatrim '400mg'$  BID 30 minutes before breakfast and dinner  2) Sphaeranthus indicus and Garcinia mangostana (combinations of these and #1 can be found in capsicum and zychrome  3) green coffee bean extract '400mg'$  twice per day or Irvingia (african mango) 150 to '300mg'$  twice per day. Teas: Green tea  Black tea Oolong tea White tea [place] [employer] Tea, [place] Continue to avoid added sugar. Continue using honey or monk fruit as sweeteners.   7. Status Post Cervical Spinal Fusion: C5C6- C-6- C-7 Anterior Cervical Discectomy Fusion and Allograft Plate: Dr. Lorin Mercy Following. 05/25/2021  8.  Cervicalgia/ Cervical Radiculitis/ S/P Cervical Spinal Fusion:  Continue to Monitor Dr. Lorin Mercy Following. 05/25/2021  9. 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 05/25/2021.  11.Right   Greater Trochanteric Bursitis: . Continue to Alternate Ice and heat Therapy. Continue to Monitor. 05/25/2021.  12. Chronic Bilateral Knee Pain: No Complaints Today. Continue HEP as Tolerated. Continue to Monitor. 05/25/2021  13. Lateral Epicondylitis of Right Elbow: No complaints today.  Ortho Following. S/P   06/26/2020 Right lateral epicondyle debridement, drilling and repair  By Dr Lorin Mercy. 14. Neuralgia/ Right Facial Droop: Neurology following. Continue to monitor. 05/25/2021.  15. Polyarthralgia: No complaints today. Continue HEP as tolerated. Continue to monitor.   16. Nausea: discussed trying an elimination diet avoiding gluten, dairy, and eggs for 4 weeks.   17. Right lower extremity pain: vascular ultrasound ordered to assess for clot.   18. Abdominal cramps -recommended saffron -recommended to follow-up with OB/GYN  19. Rib pain: -recommended trying lidocaine patch  20. Left knee: potential meniscal tear: discusses getting MRI -discussed benefits of infrared light therapy.  -discussed response to steroid shot- was quite painful but benefitted her afterward.    21. Brain Fog -discussed speech therapy -discussed her full 6 hour neuropsych testing at [facility] and was told overall she did well but there were some domains in which she stayed low -continue CPAP -continue to stay well hydrated.  -discussed low dose naltrexone  22. Interstitial cystitis -discussed that UA is normal, discussed medication treatment options for interstitial cystitis and sent her a link with more information. -discussed pentosan polysulfate sodium as a treatment to help restore the inner surface of the bladder.   23) MTHFR  deficiency -check homocysteine level  24) Mixed hyperlipidemia: -check homocysteine level  25) Trigeminal neuralgia -discussed carbamazepine but she defers due to side effects in the past.

## 2022-09-04 ENCOUNTER — Ambulatory Visit: Payer: Medicare Other

## 2022-09-04 ENCOUNTER — Ambulatory Visit (INDEPENDENT_AMBULATORY_CARE_PROVIDER_SITE_OTHER): Payer: Medicare Other | Admitting: Nurse Practitioner

## 2022-09-04 ENCOUNTER — Encounter: Payer: Self-pay | Admitting: Physical Medicine and Rehabilitation

## 2022-09-04 ENCOUNTER — Encounter: Payer: Self-pay | Admitting: Nurse Practitioner

## 2022-09-04 VITALS — BP 122/86 | HR 73 | Temp 98.1°F | Resp 16 | Ht 63.0 in | Wt 224.6 lb

## 2022-09-04 DIAGNOSIS — J4540 Moderate persistent asthma, uncomplicated: Secondary | ICD-10-CM

## 2022-09-04 DIAGNOSIS — Z760 Encounter for issue of repeat prescription: Secondary | ICD-10-CM | POA: Diagnosis not present

## 2022-09-04 DIAGNOSIS — R0602 Shortness of breath: Secondary | ICD-10-CM

## 2022-09-04 DIAGNOSIS — J301 Allergic rhinitis due to pollen: Secondary | ICD-10-CM

## 2022-09-04 LAB — HOMOCYSTEINE: Homocysteine: 7.3 umol/L (ref 0.0–14.5)

## 2022-09-04 LAB — VITAMIN D 25 HYDROXY (VIT D DEFICIENCY, FRACTURES): Vit D, 25-Hydroxy: 45.3 ng/mL (ref 30.0–100.0)

## 2022-09-04 MED ORDER — EPINEPHRINE 0.3 MG/0.3ML IJ SOAJ: INTRAMUSCULAR | 1 refills | Status: AC

## 2022-09-04 NOTE — Progress Notes (Signed)
[facility] [place], [place] [address]  Internal MEDICINE  Office Visit Note  Patient Name: Karina Greer  [id number]  [id number]  Date of Service: 09/04/2022  Chief Complaint  Patient presents with   Depression   Diabetes   Gastroesophageal Reflux    HPI Karina Greer presents for a follow up visit for environmental allergies and asthma.  --in-office spirometry -- improved and normal today when compared to spiro in April.  --received allergy shots today, will continue until current vial runs out.  --will see how she does without allergy injections, and discuss referral to allergist next year on an as needed basis.     Current Medication: Outpatient Encounter Medications as of 09/04/2022  Medication Sig   ACCU-CHEK GUIDE test strip USE 1 (ONE) EACH DAILY FOR GLUCOSE MONITORING   Accu-Chek Softclix Lancets lancets SMARTSIG:1 stick Topical Daily   albuterol (PROVENTIL) (2.5 MG/3ML) 0.083% nebulizer solution USE 1 VIAL IN NEBULIZER EVERY 6 HOURS   albuterol (VENTOLIN HFA) 108 (90 Base) MCG/ACT inhaler INHALE 2 PUFFS BY MOUTH EVERY 4 TO 6 HOURS AS NEEDED   ammonium lactate (LAC-HYDRIN) 12 % lotion APPLY 1 APPLICATION TOPICALLY AS NEEDED FOR DRY SKIN.   b complex vitamins tablet Take 1 tablet by mouth daily.   calcitonin, salmon, (MIACALCIN/FORTICAL) 200 UNIT/ACT nasal spray Place 1 spray into alternate nostrils daily.    chlorhexidine (PERIDEX) 0.12 % solution SMARTSIG:By Mouth   Cholecalciferol (VITAMIN D) 50 MCG (2000 UT) CAPS Take 1 capsule by mouth once a week.   clotrimazole-betamethasone (LOTRISONE) cream Apply 1 Application topically 2 (two) times daily as needed.   DEXILANT 60 MG capsule Take 1 capsule by mouth daily as needed.   dicyclomine (BENTYL) 10 MG capsule Take 10 mg by mouth 2 (two) times daily as needed.   fexofenadine (ALLEGRA) 180 MG tablet Take 180 mg by mouth daily.   fluticasone (FLOVENT HFA) 110 MCG/ACT inhaler INHALE 1 PUFF BY MOUTH  TWICE A DAY   HYDROcodone-acetaminophen (NORCO) 10-325 MG tablet Take 1 tablet by mouth every 8 (eight) hours as needed for pain.   hydrocortisone valerate cream (WESTCORT) 0.2 % Apply topically 2 (two) times daily.   ketoconazole (NIZORAL) 2 % shampoo Apply 1 application topically 2 (two) times a week.    KRILL OIL PO Take by mouth.   lidocaine (LIDODERM) 5 % Place 1 patch onto the skin daily. Remove & Discard patch within 12 hours or as directed by MD   magnesium oxide (MAG-OX) 400 MG tablet Take by mouth.   meclizine (ANTIVERT) 25 MG tablet Take by mouth.   ondansetron (ZOFRAN-ODT) 8 MG disintegrating tablet Take 8 mg by mouth 3 (three) times daily.   PRESCRIPTION MEDICATION every 14 (fourteen) days. 2 ALLERGY INJECTIONS EVERY 2 WKS   rizatriptan (MAXALT-MLT) 10 MG disintegrating tablet    SPIRULINA PO Take 1 capsule by mouth daily.   tiZANidine (ZANAFLEX) 2 MG tablet Take 1 tablet (2 mg total) by mouth every 8 (eight) hours as needed.   tiZANidine (ZANAFLEX) 4 MG capsule Take by mouth.   valACYclovir (VALTREX) 1000 MG tablet Take by mouth.   [DISCONTINUED] EPINEPHrine (EPIPEN 2-PAK) 0.3 mg/0.3 mL IJ SOAJ injection Use as directed   EPINEPHrine (EPIPEN 2-PAK) 0.3 mg/0.3 mL IJ SOAJ injection Use as directed   No facility-administered encounter medications on file as of 09/04/2022.    Surgical History: Past Surgical History:  Procedure Laterality Date   ACHILLES TENDON SURGERY Right 3/16   ANTERIOR CERVICAL DECOMP/DISCECTOMY  FUSION N/A 03/11/2016   Procedure: C5-6, C6-7 Anterior Cervical Discectomy and Fusion, Allograft, Plate;  Surgeon: Marybelle Killings, MD;  Location: [facility];  Service: Orthopedics;  Laterality: N/A;   CHOLECYSTECTOMY  2014   COLONOSCOPY WITH PROPOFOL N/A 08/25/2017   Procedure: COLONOSCOPY WITH PROPOFOL;  Surgeon: Manya Silvas, MD;  Location: [facility] ENDOSCOPY;  Service: Endoscopy;  Laterality: N/A;   CYSTO WITH HYDRODISTENSION N/A 01/26/2014   Procedure:  CYSTOSCOPY/HYDRODISTENSION;  Surgeon: Bernestine Amass, MD;  [facility];  Service: Urology;  Laterality: N/A;   CYSTO/  URETHRAL DILATION/  HYDRODISTENTION/   INSTILLATION THERAPY  07-16-2010//   12-30-2007//   10-27-2006   ELBOW SURGERY Right    EXERCISE TOLERENCE TEST  10-12-2010   NEGATIVE  ADEQUATE ETT/  NO ISCHEMIA OR EVIDENCE HIGH GRADE OBSTRUCTIVE CAD/  NO FURTHER TEST NEEDED   HYSTEROSCOPY W/  [place] ENDOMETRIAL ABLATION  2014   LAPAROSCOPIC OVARIAN CYST BX  2005   AND  URETEROSCOPIC LASER LITHO  STONE EXTRACTION   SHOULDER OPEN ROTATOR CUFF REPAIR Right 2004   TONSILLECTOMY     TRANSTHORACIC ECHOCARDIOGRAM  06-07-2006   normal study/  ef 60-65%   TUBAL LIGATION Bilateral 1995    Medical History: Past Medical History:  Diagnosis Date   Anxiety    Anxiety disorder    Arthritis    Blepharitis    Chronic low back pain    Chronic pain    Depression    Diabetes mellitus without complication (HCC)    Dyslipidemia    Family history of blood clots    Fibromyalgia    GERD (gastroesophageal reflux disease)    Headache    hx migraines   History of kidney stones    History of panic attacks    History of renal calculi    IBS (irritable bowel syndrome)    Interstitial cystitis    Lupus ([place])    tested positive for antibodies for lupus. 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    Family History: Family History  Problem Relation Age of Onset   Hypertension Mother    Diabetes Father    Brain cancer Father    Clotting disorder Father        blood clots   Fibromyalgia Sister    Suicidality  Sister    Hypertension Other        Cancer, Cerebrovascular disease run on mother side of family    Social History   Socioeconomic History   Marital status: Divorced    Spouse name: Not on file   Number of children: 3   Years of education: HS   Highest education level: 12th grade  Occupational History   Occupation: Merchandiser, retail: UNEMPLOYED    Comment: Disability  Tobacco Use   Smoking status: Never   Smokeless tobacco: Never  Vaping Use   Vaping Use: Never used  Substance and Sexual Activity   Alcohol use: No   Drug use: No   Sexual  activity: Not on file  Other Topics Concern   Not on file  Social History Narrative   Patient is right-handed. She avoids caffeine. She has recently been using the treadmill.   Social Determinants of Health   Financial Resource Strain: Not on file  Food Insecurity: Not on file  Transportation Needs: Not on file  Physical Activity: Not on file  Stress: Not on file  Social Connections: Not on file  Intimate Partner Violence: Not on file      Review of Systems  Constitutional:  Negative for chills, fatigue and unexpected weight change.  HENT:  Positive for congestion, postnasal drip and rhinorrhea. Negative for sneezing and sore throat.        Symptoms are intermittent, allergy shots helps  Eyes:  Negative for redness.  Respiratory:  Negative for cough, chest tightness, shortness of breath and wheezing.   Cardiovascular:  Negative for chest pain and palpitations.  Gastrointestinal:  Negative for abdominal pain, constipation, diarrhea, nausea and vomiting.  Genitourinary:  Negative for dysuria and frequency.  Musculoskeletal:  Negative for arthralgias, back pain, joint swelling and neck pain.  Skin:  Negative for rash.  Neurological: Negative.  Negative for tremors and numbness.  Hematological:  Negative for adenopathy. Does not bruise/bleed easily.  Psychiatric/Behavioral:  Negative for behavioral problems (Depression),  sleep disturbance and suicidal ideas. The patient is not nervous/anxious.     Vital Signs: BP 122/86   Pulse 73   Temp 98.1 F (36.7 C)   Resp 16   Ht '5\' 3"'$  (1.6 m)   Wt 224 lb 9.6 oz (101.9 kg)   SpO2 97%   BMI 39.79 kg/m    Physical Exam Vitals reviewed.  Constitutional:      Appearance: Normal appearance. She is obese.  HENT:     Head: Normocephalic and atraumatic.  Eyes:     Pupils: Pupils are equal, round, and reactive to light.  Cardiovascular:     Rate and Rhythm: Normal rate and regular rhythm.     Heart sounds: Normal heart sounds. No murmur heard. Pulmonary:     Effort: Pulmonary effort is normal. No respiratory distress.     Breath sounds: Normal breath sounds. No wheezing.  Neurological:     Mental Status: She is alert and oriented to person, place, and time.  Psychiatric:        Mood and Affect: Mood normal.        Behavior: Behavior normal.        Assessment/Plan: 1. Moderate persistent asthma without complication Spiro normal and improved, asthma stable.  - Spirometry with Graph  2. SOB (shortness of breath) See problem #1 - Spirometry with Graph  3. Medication refill - EPINEPHrine (EPIPEN 2-PAK) 0.3 mg/0.3 mL IJ SOAJ injection; Use as directed  Dispense: 1 each; Refill: 1   General Counseling: Karina Greer verbalizes understanding of the findings of todays visit and agrees with plan of treatment. I have discussed any further diagnostic evaluation that may be needed or ordered today. We also reviewed her medications today. she has been encouraged to call the office with any questions or concerns that should arise related to todays visit.    Orders Placed This Encounter  Procedures   Spirometry with Graph    Meds ordered this encounter  Medications   EPINEPHrine (EPIPEN 2-PAK) 0.3 mg/0.3 mL IJ SOAJ injection    Sig: Use as directed    Dispense:  1 each    Refill:  1    Return in  about 6 months (around 03/06/2023) for F/U, pulmonary only  and allergy, w/Yaminah Clayborn or DSK.   Total time spent:30 Minutes Time spent includes review of chart, medications, test results, and follow up plan with the patient.   [place] Controlled Substance Database was reviewed by me.  This patient was seen by Jonetta Osgood, FNP-C in collaboration with Dr. Clayborn Bigness as a part of collaborative care agreement.   Monserat Prestigiacomo R. [name], MSN, FNP-C Internal medicine

## 2022-09-05 ENCOUNTER — Encounter (HOSPITAL_BASED_OUTPATIENT_CLINIC_OR_DEPARTMENT_OTHER): Payer: Medicare Other | Admitting: Physical Medicine and Rehabilitation

## 2022-09-05 DIAGNOSIS — Z1589 Genetic susceptibility to other disease: Secondary | ICD-10-CM | POA: Diagnosis not present

## 2022-09-05 NOTE — Progress Notes (Signed)
Subjective:    Patient ID: Karina Greer, female    DOB: Dec 01, 1965, 56 y.o.   MRN: [id number]  An audio/video tele-health visit is felt to be the most appropriate encounter for this patient at this time. This is a follow up tele-visit via phone. The patient is at home. MD is at office. Prior to scheduling this appointment, our staff discussed the limitations of evaluation and management by telemedicine and the availability of in-person appointments. The patient expressed understanding and agreed to proceed.   Karina Greer is a 56 y.o. female who presents for f/u of fibromyalgia and interstitial cystitis.   1) Interstitial cystitis -she has been having burning when she urinates -she got UA done yesterday, discussed results with her today  2) Diffuse chronic pain: back pain and diffuse in joints and muscles -her back has been more stiff recently -cold weather worsens her symptoms -she tried naltrexone, she felt like it made her sick, she feels nauseous every day so it wasn't that- it was more flulike symptoms. This was with the '1mg'$  of naltrexone. She did not take opioid that day.  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 Sometimes she does not eat until lunch. She has been trying to do the fasting we discussed last visit.  -Her son is 278 pounds and 6 foot tall. He went to 242 lbs following a similar diet.  -Her dietary goals are to make use of what she eats.  -she has been eating a lot of salads.  -she cannot handle hard  cheeses.  -she has been having a problem eating hamburger.  -Last night she made four pieces of chicken and put on some tomato sauce and red peppers, she sprinkled some cheese, added broccoli, olive oil, garlic, butter.  -she is planned to have her HgbA1c and lipid panel drawn by her PCP next week.  -has been having low back pain, MRI ordered and shows stable spondylosis from prior -she feels better when there is no food in her stomach -she got organic oranges from a farm and is going to make her own orange juice -she bought the Bioptemizer's magnesium so will start this soon -she is having a lot of pain in her neck and feels this is from the weather and her surgery -her calf is hurting a lot. She has not had an ultrasound. -blood clots run in her family.   -she bought [employer], Engineering geologist, magnesium breakthrough, Krill oil, Spirulina, Coenzyme Q10 -she saw an endocrinologist and she did a swab test -she went today for bloodwork to check her potassium, cortisol to look for Cushing's Disease -she feels like her hormones are off balance.  -she feels that she has all the symptoms of Cushing's Disease -she was told her rheum panel is normal.  -she was told she has a meniscal tear by ortho   3) Trigeminal neuralgia bilateral: -she said a procedure on her left side with good effect -she tried carbamezepine and it made  her stare into outer space  4) Obesity -she is down to 225 lbs!  5) MTHFR mutation -discussed that homocysteine level is 7.3, which is in the normal range -she tries to eat healthy -she does not smoke or use alcohol  Pain Inventory Average Pain 8 Pain Right Now 6 My pain is constant, sharp, burning, dull, stabbing, and tingling  In the last 24 hours, has pain interfered with the following? General activity 5 Relation with others 5 Enjoyment of life 3 What TIME of day is your pain at its worst? morning , daytime, and night Sleep (in general) Poor  Pain is worse with:  walking, bending, sitting, inactivity, standing, unsure, and some activites Pain improves with: rest, heat/ice, therapy/exercise, pacing activities, medication, TENS, and injections Relief from Meds: 7  Family History  Problem Relation Age of Onset   Hypertension Mother    Diabetes Father    Brain cancer Father    Clotting disorder Father        blood clots   Fibromyalgia Sister    Suicidality Sister    Hypertension Other        Cancer, Cerebrovascular disease run on mother side of family   Social History   Socioeconomic History   Marital status: Divorced    Spouse name: Not on file   Number of children: 3   Years of education: HS   Highest education level: 12th grade  Occupational History   Occupation: Merchandiser, retail: UNEMPLOYED    Comment: Disability  Tobacco Use   Smoking status: Never   Smokeless tobacco: Never  Vaping Use   Vaping Use: Never used  Substance and Sexual Activity   Alcohol use: No   Drug use: No   Sexual activity: Not on file  Other Topics Concern   Not on file  Social History Narrative   Patient is right-handed. She avoids caffeine. She has recently been using the treadmill.   Social Determinants of Health   Financial Resource Strain: Not on file  Food Insecurity: Not on file  Transportation Needs: Not on file  Physical Activity: Not on file  Stress: Not on file  Social Connections: Not on file   Past Surgical History:  Procedure Laterality Date   ACHILLES TENDON SURGERY Right 3/16   ANTERIOR CERVICAL DECOMP/DISCECTOMY FUSION N/A 03/11/2016   Procedure: C5-6, C6-7 Anterior Cervical Discectomy and Fusion, Allograft, Plate;  Surgeon: Marybelle Killings, MD;  Location: [place];  Service: Orthopedics;  Laterality: N/A;   CHOLECYSTECTOMY  2014   COLONOSCOPY WITH PROPOFOL N/A 08/25/2017   Procedure: COLONOSCOPY WITH PROPOFOL;  Surgeon: Manya Silvas, MD;  Location: [facility] ENDOSCOPY;  Service: Endoscopy;  Laterality: N/A;   CYSTO WITH  HYDRODISTENSION N/A 01/26/2014   Procedure: CYSTOSCOPY/HYDRODISTENSION;  Surgeon: Bernestine Amass, MD;  Location: [facility];  Service: Urology;  Laterality: N/A;   CYSTO/  URETHRAL DILATION/  HYDRODISTENTION/   INSTILLATION THERAPY  07-16-2010//   12-30-2007//   10-27-2006   ELBOW SURGERY Right    EXERCISE TOLERENCE TEST  10-12-2010   NEGATIVE  ADEQUATE ETT/  NO ISCHEMIA OR EVIDENCE HIGH GRADE OBSTRUCTIVE CAD/  NO FURTHER TEST NEEDED   HYSTEROSCOPY W/  NOVASURE ENDOMETRIAL ABLATION  2014   LAPAROSCOPIC OVARIAN CYST BX  2005   AND  URETEROSCOPIC LASER LITHO  STONE EXTRACTION   SHOULDER OPEN ROTATOR CUFF REPAIR Right 2004   TONSILLECTOMY     TRANSTHORACIC ECHOCARDIOGRAM  06-07-2006   normal study/  ef 60-65%  TUBAL LIGATION Bilateral 1995   Past Surgical History:  Procedure Laterality Date   ACHILLES TENDON SURGERY Right 3/16   ANTERIOR CERVICAL DECOMP/DISCECTOMY FUSION N/A 03/11/2016   Procedure: C5-6, C6-7 Anterior Cervical Discectomy and Fusion, Allograft, Plate;  Surgeon: Marybelle Killings, MD;  Location: [place];  Service: Orthopedics;  Laterality: N/A;   CHOLECYSTECTOMY  2014   COLONOSCOPY WITH PROPOFOL N/A 08/25/2017   Procedure: COLONOSCOPY WITH PROPOFOL;  Surgeon: Manya Silvas, MD;  Location: [facility] ENDOSCOPY;  Service: Endoscopy;  Laterality: N/A;   CYSTO WITH HYDRODISTENSION N/A 01/26/2014   Procedure: CYSTOSCOPY/HYDRODISTENSION;  Surgeon: Bernestine Amass, MD;  Location: [facility];  Service: Urology;  Laterality: N/A;   CYSTO/  URETHRAL DILATION/  HYDRODISTENTION/   INSTILLATION THERAPY  07-16-2010//   12-30-2007//   10-27-2006   ELBOW SURGERY Right    EXERCISE TOLERENCE TEST  10-12-2010   NEGATIVE  ADEQUATE ETT/  NO ISCHEMIA OR EVIDENCE HIGH GRADE OBSTRUCTIVE CAD/  NO FURTHER TEST NEEDED   HYSTEROSCOPY W/  [place] ENDOMETRIAL ABLATION  2014   LAPAROSCOPIC OVARIAN CYST BX  2005   AND  URETEROSCOPIC LASER LITHO  STONE EXTRACTION   SHOULDER OPEN ROTATOR  CUFF REPAIR Right 2004   TONSILLECTOMY     TRANSTHORACIC ECHOCARDIOGRAM  06-07-2006   normal study/  ef 60-65%   TUBAL LIGATION Bilateral 1995   Past Medical History:  Diagnosis Date   Anxiety    Anxiety disorder    Arthritis    Blepharitis    Chronic low back pain    Chronic pain    Depression    Diabetes mellitus without complication (HCC)    Dyslipidemia    Family history of blood clots    Fibromyalgia    GERD (gastroesophageal reflux disease)    Headache    hx migraines   History of kidney stones    History of panic attacks    History of renal calculi    IBS (irritable bowel syndrome)    Interstitial cystitis    Lupus ([place])    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   There were no vitals taken for this visit.  Opioid Risk Score:   Fall Risk Score:  `1  Depression screen [facility] 2/9     09/03/2022   12:46 PM 07/30/2022   10:21 AM 07/01/2022   10:02 AM 05/08/2022    9:58 AM 04/10/2022    9:35 AM 03/13/2022    9:52 AM 02/26/2022    9:25 AM  Depression screen PHQ 2/9  Decreased Interest 0 1 0 0 0 0 0  Down, Depressed, Hopeless 0 1 0 0 0 0 0  PHQ - 2 Score 0 2 0 0 0 0 0    Review of Systems  Musculoskeletal:  Positive for arthralgias and back pain.       Pain all over the body  All other systems reviewed and are negative.      Objective:   Physical  Exam Gen: no distress, normal appearing, weight 229 lbs, BMI 40.57 HEENT: oral mucosa pink and moist, NCAT Cardio: Reg rate Chest: normal effort, normal rate of breathing Abd: soft, non-distended Ext: no edema Psych:  pleasant, normal affect Skin: intact Neuro: Alert and oriented x3    Assessment & Plan:  1. Lumbago/ Lumbar Spondylosis/ Left Lumbar Radiculitis: Continue to Monitor. 09/02/202. -Refilled HYDROcodone 10/'325mg'$  one tablet every 8 hours as needed #90. Discussed that MRI results show spondylosis that is stable from prior. Continue using muscle rub. Refilled Lidocaine patches.   -discussed weaning off if she can tolerate.  -discussed risks and benefits of bracing. Prescribed lumboscaral brace with the goal of reducing pain medication use and improving mobility.   -prescribed Zynex Tens unit  -discussed mechanism of action of low dose naltrexone as an opioid receptor antagonist which stimulates your body's production of its own natural endogenous opioids, helping to decrease pain. Discussed that it can also decrease T cell response and thus be helpful in decreasing inflammation, and symptoms of brain fog, fatigue, anxiety, depression, and allergies. Discussed that this medication needs to be compounded at a compounding pharmacy and can more expensive. Discussed that I usually start at '1mg'$  and if this is not providing enough relief then I titrate upward on a monthly basis.    -UDS today  -Discussed her worsening pain with the cold weather  -refilled lidocaine patches  2. Fibromyalgia. Continue Current exercise Regime. 05/25/2021. Discussed aqua therapy.  -recommended infrared light therapy -ordered Zynex Medical heating blanket for 90 days, discussed the benefits of heat in improving blood circulation.  -discussed use of TENs unit  3. Anxiety and depression:Psychiatry Following: Counselor Jessica  at the [facility] . Continue Counseling at [facility]. 05/25/2021  4. Migraines: On Maxalt. Neurology Following. 05/25/2021. Start Bioptemizers magnesium.   5. OSA : PCP Following. Continue to Monitor. 05/25/2021  6. Obesity:   -commended on 6 lbs loss! -continue to avoid bread -check  vitamin D level today.  -continue 2 TB of saurkraut every morning  -recommended to use her krill oil, Vitamin D, [place], magnesium breakthrough  -encouraged dandelion tea  -discussed wormwood/walnut oil cleanse -recommended nattokinase -encouraged endocrinology follow-up -counseled regarding the benefits of intermittent fasting and improving insulin sensitivity. -Educated that current weight is 225 lbs and current BMI is 40.00 -Educated regarding health benefits of weight loss- for pain, general health, chronic disease prevention, immune health, mental health.  -Will monitor weight every visit.  -Continue Ro[place]ois tea daily. -Discussed the benefits of intermittent fasting. Continue this. Discussed that 30 hour fasts are healthy.  -Discussed foods that can assist in weight loss: 1) leafy greens- high in fiber and nutrients 2) dark chocolate- improves metabolism (if prefer sweetened, best to sweeten with honey instead of sugar).  3) cruciferous vegetables- high in fiber and protein 4) full fat yogurt: high in healthy fat, protein, calcium, and probiotics 5) apples- high in a variety of phytochemicals 6) nuts- high in fiber and protein that increase feelings of fullness 7) grapefruit: rich in nutrients, antioxidants, and fiber (not to be taken with anticoagulation) 8) beans- high in protein and fiber 9) salmon- has high quality protein and healthy fats 10) green tea- rich in polyphenols 11) eggs- rich in choline and vitamin D 12) tuna- high protein, boosts metabolism 13) avocado- decreases visceral abdominal fat 14) chicken (pasture raised): high in protein and iron 15) blueberries- reduce abdominal fat and cholesterol 16) whole grains- decreases calories retained during digestion, speeds metabolism  17) chia seeds- curb appetite 18) chilies- increases fat metabolism  -Discussed supplements that can be used:  1) Metatrim '400mg'$  BID 30 minutes before breakfast and dinner  2) Sphaeranthus  indicus and Garcinia mangostana (combinations of these and #1 can be found in capsicum and zychrome  3) green coffee bean extract '400mg'$  twice per day or Irvingia (african mango) 150 to '300mg'$  twice per day. Teas: Green tea Black tea Oolong tea White tea [place] of Tea, [place] Continue to avoid added sugar. Continue using honey or monk fruit as sweeteners.   7. Status Post Cervical Spinal Fusion: C5C6- C-6- C-7 Anterior Cervical Discectomy Fusion and Allograft Plate: Dr. Lorin Mercy Following. 05/25/2021  8. Cervicalgia/ Cervical Radiculitis/ S/P Cervical Spinal Fusion:  Continue to Monitor Dr. Lorin Mercy Following. 05/25/2021  9. 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 05/25/2021.  11.Right   Greater Trochanteric Bursitis: . Continue to Alternate Ice and heat Therapy. Continue to Monitor. 05/25/2021.  12. Chronic Bilateral Knee Pain: No Complaints Today. Continue HEP as Tolerated. Continue to Monitor. 05/25/2021  13. Lateral Epicondylitis of Right Elbow: No complaints today.  Ortho Following. S/P   06/26/2020 Right lateral epicondyle debridement, drilling and repair  By Dr Lorin Mercy. 14. Neuralgia/ Right Facial Droop: Neurology following. Continue to monitor. 05/25/2021.  15. Polyarthralgia: No complaints today. Continue HEP as tolerated. Continue to monitor.   16. Nausea: discussed trying an elimination diet avoiding gluten, dairy, and eggs for 4 weeks.   17. Right lower extremity pain: vascular ultrasound ordered to assess for clot.   18. Abdominal cramps -recommended saffron -recommended to follow-up with OB/GYN  19. Rib pain: -recommended trying lidocaine patch  20. Left knee: potential meniscal tear: discusses getting MRI -discussed benefits of infrared light therapy.  -discussed response to steroid shot- was quite painful but benefitted her afterward.    21. Brain Fog -discussed speech  therapy -discussed her full 6 hour neuropsych testing at [facility] and was told overall she did well but there were some domains in which she stayed low -continue CPAP -continue to stay well hydrated.  -discussed low dose naltrexone  22. Interstitial cystitis -discussed that UA is normal, discussed medication treatment options for interstitial cystitis and sent her a link with more information. -discussed pentosan polysulfate sodium as a treatment to help restore the inner surface of the bladder.   23) MTHFR deficiency -discussed homocysteine level is 7.3 -commended on healthy lifestyle habits -made goal to eat leafy green vegetables  24) Mixed hyperlipidemia: -check homocysteine level  25) Trigeminal neuralgia -discussed carbamazepine but she defers due to side effects in the past.    6 minutes spent in discussion that homocysteine level is 7.3, commended on healthy lifestyle habits

## 2022-09-12 ENCOUNTER — Other Ambulatory Visit (HOSPITAL_COMMUNITY): Payer: Self-pay

## 2022-09-12 ENCOUNTER — Other Ambulatory Visit: Payer: Self-pay | Admitting: Podiatry

## 2022-09-12 ENCOUNTER — Telehealth: Payer: Self-pay

## 2022-09-12 NOTE — Telephone Encounter (Signed)
Repeat : Prior Authorization for Lidocaine 5% Patches submitted in Cover My Meds.

## 2022-09-17 ENCOUNTER — Other Ambulatory Visit (HOSPITAL_COMMUNITY): Payer: Self-pay

## 2022-09-20 ENCOUNTER — Other Ambulatory Visit (HOSPITAL_COMMUNITY): Payer: Self-pay

## 2022-09-27 ENCOUNTER — Encounter: Payer: Self-pay | Admitting: Registered Nurse

## 2022-09-27 ENCOUNTER — Other Ambulatory Visit (HOSPITAL_COMMUNITY): Payer: Self-pay

## 2022-09-27 ENCOUNTER — Encounter: Payer: Medicare Other | Attending: Physical Medicine & Rehabilitation | Admitting: Registered Nurse

## 2022-09-27 VITALS — BP 130/85 | HR 70 | Ht 63.0 in | Wt 225.0 lb

## 2022-09-27 DIAGNOSIS — G894 Chronic pain syndrome: Secondary | ICD-10-CM

## 2022-09-27 DIAGNOSIS — M542 Cervicalgia: Secondary | ICD-10-CM | POA: Diagnosis not present

## 2022-09-27 DIAGNOSIS — M25562 Pain in left knee: Secondary | ICD-10-CM

## 2022-09-27 DIAGNOSIS — G8929 Other chronic pain: Secondary | ICD-10-CM | POA: Insufficient documentation

## 2022-09-27 DIAGNOSIS — M5416 Radiculopathy, lumbar region: Secondary | ICD-10-CM | POA: Diagnosis not present

## 2022-09-27 DIAGNOSIS — Z5181 Encounter for therapeutic drug level monitoring: Secondary | ICD-10-CM | POA: Insufficient documentation

## 2022-09-27 DIAGNOSIS — M797 Fibromyalgia: Secondary | ICD-10-CM | POA: Insufficient documentation

## 2022-09-27 DIAGNOSIS — Z79899 Other long term (current) drug therapy: Secondary | ICD-10-CM | POA: Diagnosis not present

## 2022-09-27 MED ORDER — HYDROCODONE-ACETAMINOPHEN 10-325 MG PO TABS
1.0000 | ORAL_TABLET | Freq: Three times a day (TID) | ORAL | 0 refills | Status: DC | PRN
  Filled 2022-09-27 – 2022-10-02 (×2): qty 90, 30d supply, fill #0
  Filled 2022-10-04: qty 82, 28d supply, fill #0

## 2022-09-27 NOTE — Progress Notes (Unsigned)
Subjective:    Patient ID: Karina Greer, female    DOB: 05/23/1966, 57 y.o.   MRN: [id number]  HPI: DESHARA Greer is a 57 y.o. female who returns for follow up appointment for chronic pain and medication refill. states *** pain is located in  ***. rates pain ***. current exercise regime is walking and performing stretching exercises.  [name] Morphine equivalent is *** MME.   Last UDS was Performed on 07/01/2022, it was consistent.    Pain Inventory Average Pain 9 Pain Right Now 6 My pain is constant, sharp, and tingling  In the last 24 hours, has pain interfered with the following? General activity 7 Relation with others 7 Enjoyment of life 7 What TIME of day is your pain at its worst? morning , evening, and night Sleep (in general) Fair  Pain is worse with: walking, bending, sitting, inactivity, standing, and some activites Pain improves with: rest, heat/ice, and medication Relief from Meds: 7  Family History  Problem Relation Age of Onset   Hypertension Mother    Diabetes Father    Brain cancer Father    Clotting disorder Father        blood clots   Fibromyalgia Sister    Suicidality Sister    Hypertension Other        Cancer, Cerebrovascular disease run on mother side of family   Social History   Socioeconomic History   Marital status: Divorced    Spouse name: Not on file   Number of children: 3   Years of education: HS   Highest education level: 12th grade  Occupational History   Occupation: Merchandiser, retail: UNEMPLOYED    Comment: Disability  Tobacco Use   Smoking status: Never   Smokeless tobacco: Never  Vaping Use   Vaping Use: Never used  Substance and Sexual Activity   Alcohol use: No   Drug use: No   Sexual activity: Not on file  Other Topics Concern   Not on file  Social History Narrative   Patient is right-handed. She avoids caffeine. She has recently been using the treadmill.   Social Determinants of Health   Financial  Resource Strain: Not on file  Food Insecurity: Not on file  Transportation Needs: Not on file  Physical Activity: Not on file  Stress: Not on file  Social Connections: Not on file   Past Surgical History:  Procedure Laterality Date   ACHILLES TENDON SURGERY Right 3/16   ANTERIOR CERVICAL DECOMP/DISCECTOMY FUSION N/A 03/11/2016   Procedure: C5-6, C6-7 Anterior Cervical Discectomy and Fusion, Allograft, Plate;  Surgeon: Marybelle Killings, MD;  Location: [place];  Service: Orthopedics;  Laterality: N/A;   CHOLECYSTECTOMY  2014   COLONOSCOPY WITH PROPOFOL N/A 08/25/2017   Procedure: COLONOSCOPY WITH PROPOFOL;  Surgeon: Manya Silvas, MD;  Location[facility] ENDOSCOPY;  Service: Endoscopy;  Laterality: N/A;   CYSTO WITH HYDRODISTENSION N/A 01/26/2014   Procedure: CYSTOSCOPY/HYDRODISTENSION;  Surgeon: Bernestine Amass, MD;  Location: [facility];  Service: Urology;  Laterality: N/A;   CYSTO/  URETHRAL DILATION/  HYDRODISTENTION/   INSTILLATION THERAPY  07-16-2010//   12-30-2007//   10-27-2006   ELBOW SURGERY Right    EXERCISE TOLERENCE TEST  10-12-2010   NEGATIVE  ADEQUATE ETT/  NO ISCHEMIA OR EVIDENCE HIGH GRADE OBSTRUCTIVE CAD/  NO FURTHER TEST NEEDED   HYSTEROSCOPY W/  NOVASURE ENDOMETRIAL ABLATION  2014   LAPAROSCOPIC OVARIAN CYST BX  2005   AND  URETEROSCOPIC LASER LITHO  STONE EXTRACTION   SHOULDER OPEN ROTATOR CUFF REPAIR Right 2004   TONSILLECTOMY     TRANSTHORACIC ECHOCARDIOGRAM  06-07-2006   normal study/  ef 60-65%   TUBAL LIGATION Bilateral 1995   Past Surgical History:  Procedure Laterality Date   ACHILLES TENDON SURGERY Right 3/16   ANTERIOR CERVICAL DECOMP/DISCECTOMY FUSION N/A 03/11/2016   Procedure: C5-6, C6-7 Anterior Cervical Discectomy and Fusion, Allograft, Plate;  Surgeon: Marybelle Killings, MD;  Location: [place];  Service: Orthopedics;  Laterality: N/A;   CHOLECYSTECTOMY  2014   COLONOSCOPY WITH PROPOFOL N/A 08/25/2017   Procedure: COLONOSCOPY WITH PROPOFOL;  Surgeon:  Manya Silvas, MD;  Location: [facility] ENDOSCOPY;  Service: Endoscopy;  Laterality: N/A;   CYSTO WITH HYDRODISTENSION N/A 01/26/2014   Procedure: CYSTOSCOPY/HYDRODISTENSION;  Surgeon: Bernestine Amass, MD;  Location: [facility];  Service: Urology;  Laterality: N/A;   CYSTO/  URETHRAL DILATION/  HYDRODISTENTION/   INSTILLATION THERAPY  07-16-2010//   12-30-2007//   10-27-2006   ELBOW SURGERY Right    EXERCISE TOLERENCE TEST  10-12-2010   NEGATIVE  ADEQUATE ETT/  NO ISCHEMIA OR EVIDENCE HIGH GRADE OBSTRUCTIVE CAD/  NO FURTHER TEST NEEDED   HYSTEROSCOPY W/  [facility] ENDOMETRIAL ABLATION  2014   LAPAROSCOPIC OVARIAN CYST BX  2005   AND  URETEROSCOPIC LASER LITHO  STONE EXTRACTION   SHOULDER OPEN ROTATOR CUFF REPAIR Right 2004   TONSILLECTOMY     TRANSTHORACIC ECHOCARDIOGRAM  06-07-2006   normal study/  ef 60-65%   TUBAL LIGATION Bilateral 1995   Past Medical History:  Diagnosis Date   Anxiety    Anxiety disorder    Arthritis    Blepharitis    Chronic low back pain    Chronic pain    Depression    Diabetes mellitus without complication (HCC)    Dyslipidemia    Family history of blood clots    Fibromyalgia    GERD (gastroesophageal reflux disease)    Headache    hx migraines   History of kidney stones    History of panic attacks    History of renal calculi    IBS (irritable bowel syndrome)    Interstitial cystitis    Lupus ([place])    tested positive for antibodies for lupus. 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   There were no vitals taken for this visit.  Opioid Risk Score:   Fall Risk Score:   `1  Depression screen [facility] 2/9     09/03/2022   12:46 PM 07/30/2022   10:21 AM 07/01/2022   10:02 AM 05/08/2022    9:58 AM 04/10/2022    9:35 AM 03/13/2022    9:52 AM 02/26/2022    9:25 AM  Depression screen PHQ 2/9  Decreased Interest 0 1 0 0 0 0 0  Down, Depressed, Hopeless 0 1 0 0 0 0 0  PHQ - 2 Score 0 2 0 0 0 0 0      Review of  Systems  Musculoskeletal:  Positive for back pain and neck pain.  All other systems reviewed and are negative.     Objective:   Physical Exam        Assessment & Plan:  1. Lumbago/ Lumbar Spondylosis/ Left Lumbar Radiculitis: Continue to Monitor. 11/07203 Refilled: HYDROcodone 10/'325mg'$  one tablet every 8 hours as needed #90.  We will continue the opioid monitoring program, this consists of regular clinic visits, examinations, urine drug screen, pill counts as well as use of [place] Controlled Substance Reporting system. A 12 month History has been reviewed on the [place] on 07/30/2022. 2. Fibromyalgia. Continue Current exercise Regime. 07/30/2022 3. Anxiety and depression:Psychiatry Following: Counselor Jessica  at [facility] . Continue Counseling at [facility]. 07/30/2022 4. Migraines: On Maxalt. Neurology Following. 07/30/2022 5. OSA : PCP Following. Continue to Monitor. 07/30/2022 6. Obesity: Dr Ranell Patrick Following: Continue  Healthy Diet Regimen and Continue HEP as Tolerated. 07/30/2022 7. Status Post Cervical Spinal Fusion: C5C6- C-6- C-7 Anterior Cervical Discectomy Fusion and Allograft Plate: Dr. Lorin Mercy Following. 11/107/2023 8. Cervicalgia/ Cervical Radiculitis/ S/P Cervical Spinal Fusion:  Continue to Monitor Dr. Lorin Mercy Following. 07/30/2022 9. Muscle Spasm: Continue Tizanidine as needed.07/30/2022 10.Hereditary and Idiopathic Peripheral Neuropathy Continue with Tens Unit. 07/30/2022. 11.Left Greater Trochanteric Bursitis: . Continue to Alternate Ice and heat Therapy. Continue to  Monitor. 07/30/2022. 12. Chronic Bilateral Knee Pain: No Complaints Today. Ortho Following. Continue HEP as Tolerated. Continue to Monitor. 07/30/2022 13. Lateral Epicondylitis of Right Elbow: No complaints today.  Ortho Following. S/P   06/26/2020 Right lateral epicondyle debridement, drilling and repair  By Dr Lorin Mercy. 14. Neuralgia/ Right Facial Droop: Neurology following. Continue to monitor. 07/30/2022. 15. Polyarthralgia: No complaints today. Continue HEP as tolerated. Continue to monitor. 07/30/2022  16. Paresthesia of Right Lower Extremity: Occasionally: Continue current medication regimen. Continue to monitor. 07/30/2022 17. Fall at Home: Educated on falls prevention she verbalizes understanding.  18. Left Rib Pain S/P Fall: RX X-ray   F/U in 1 month .

## 2022-09-30 ENCOUNTER — Other Ambulatory Visit (HOSPITAL_COMMUNITY): Payer: Self-pay

## 2022-10-01 ENCOUNTER — Encounter: Payer: Self-pay | Admitting: Registered Nurse

## 2022-10-01 ENCOUNTER — Ambulatory Visit: Payer: Medicare Other

## 2022-10-02 ENCOUNTER — Other Ambulatory Visit: Payer: Self-pay

## 2022-10-02 ENCOUNTER — Other Ambulatory Visit (HOSPITAL_COMMUNITY): Payer: Self-pay

## 2022-10-03 ENCOUNTER — Other Ambulatory Visit (HOSPITAL_COMMUNITY): Payer: Self-pay

## 2022-10-04 ENCOUNTER — Other Ambulatory Visit (HOSPITAL_COMMUNITY): Payer: Self-pay

## 2022-10-04 ENCOUNTER — Ambulatory Visit (HOSPITAL_BASED_OUTPATIENT_CLINIC_OR_DEPARTMENT_OTHER): Payer: Medicare Other | Admitting: Psychiatry

## 2022-10-04 ENCOUNTER — Encounter (HOSPITAL_COMMUNITY): Payer: Self-pay | Admitting: Psychiatry

## 2022-10-04 ENCOUNTER — Other Ambulatory Visit: Payer: Self-pay

## 2022-10-04 DIAGNOSIS — Z1589 Genetic susceptibility to other disease: Secondary | ICD-10-CM | POA: Diagnosis not present

## 2022-10-04 DIAGNOSIS — G4733 Obstructive sleep apnea (adult) (pediatric): Secondary | ICD-10-CM | POA: Diagnosis not present

## 2022-10-04 DIAGNOSIS — F411 Generalized anxiety disorder: Secondary | ICD-10-CM

## 2022-10-04 DIAGNOSIS — E039 Hypothyroidism, unspecified: Secondary | ICD-10-CM

## 2022-10-04 DIAGNOSIS — M797 Fibromyalgia: Secondary | ICD-10-CM | POA: Diagnosis not present

## 2022-10-04 NOTE — Progress Notes (Signed)
Psychiatric Initial Adult Assessment   Patient Identification: Karina Greer MRN:  [id number] Date of Evaluation:  10/04/2022 Referral Source: PMR Chief Complaint:   Chief Complaint  Patient presents with   Establish Care   Anxiety   Depression   Visit Diagnosis:    ICD-10-CM   1. GENERALIZED ANXIETY DISORDER  F41.1     2. Acquired hypothyroidism  E03.9     3. Fibromyalgia  M79.7     4. OSA (obstructive sleep apnea)  G47.33     5. MTHFR gene mutation  Z15.89        Assessment:  Karina Greer is a 57 y.o. female with a history of OSA, fibromyalgia, hypothyroidism, restless leg syndrome, and MTHFR gene mutation who presents virtually to [facility] for initial evaluation on 10/04/2022.  Patient reports symptoms consistent with anxiety including excessive worry about multiple things, difficulty relaxing, increased irritability, racing thoughts, and fears that something awful happen.  In addition patient endorses poor memory, difficulty concentrating, lack of focus, difficulty organizing task, and flight of ideas.  Of note patient has a history of obstructive sleep apnea, MTHFR gene mutation, hypothyroidism, and fibromyalgia which could potentially contribute to her symptoms as well.  In addition MRI from 2021 has shown progression in white matter changes over 2 year period possibly secondary to atherosclerotic vascular disease or due to demyelinating disease.  Patient reports that she had neuropsych testing which had eliminated this concern however we are unable to the records at this time.  Patient meets criteria for generalized anxiety disorder and could benefit from treatment however based off prior failed trials and risk of her being wrapped metabolizer we will pursue genesite testing prior to starting any new medications.  A number of assessments were performed during the evaluation today including PHQ-9 which they scored a 12 on, GAD-7 which  they scored a 14 on, and [place] suicide severity screening which showed no risk.    Plan: - Can consider medications after genesite testing results reviewed - Can consider therapy in the future - Referral for genesite testing - Vit D, homocysteine, CMP, CBC PTH, TSH, reviewed - Brain MRI form 07/2020 reviewed - Patient to work on getting records from her former psychiatrist and neurologist/neuropsychologist - Crisis resources reviewed - Follow up in 6 weeks  History of Present Illness: Karina Greer presents reporting that she was referred by Dr. Ranell Patrick to psychiatry for help with her symptoms of poor concentration and memory.  Of note patient believes that other prescribers have felt that she had anxiety and depression in the past though and she believes that she is not depressed.  On exploration of her symptoms patient reports that she cannot focus, has difficulty organizing tasks, does not have linear thought patterns, and finds herself spacing out a lot.  She notes that she is trying to make schedules/list which can help however she will forget to look at them at times.  In addition to the concentration symptoms patient does endorse symptoms consistent with anxiety the including not being.  Control her worrying, worrying too much about different things, difficulty relaxing, increased irritatability, and fear that something awful might happen.  We discussed how anxiety can often present similar to ADHD how treating the anxiety may result in her concentration symptoms improving as well.  Of note patient does have a complicated medical history that includes fibromyalgia, obstructive sleep apnea, hypothyroidism, and MTHFR gene medication.  While some of these conditions remained stable by all  potential to contribute to worsening anxiety, memory, and concentration symptoms.  On exploration of depressive symptoms patient denies low mood though did of endorse some anhedonia.  She denied any SI/HI or thoughts  of self-harm.  Furthermore patient denied any history of manic symptoms, psychosis, paranoia, or delusions.  Patient's past psychiatric history was explored and while she members pieces of it she has difficulty remembering the details.  She notes that she has seen a psychiatrist at the [facility] in the past who had diagnosed her with OCD.  She also notes that she has seen multiple therapists there some of whom were helpful others were not.  At this point patient does not feel she needs a therapist.  Regarding medications she notes that she has tried several in the past without any success.  Patient believes that she tends to get the opposite effects that are expected for medications.  She did have obtained testing done around 7 years ago she believes however does not have the results.  Patient recalls that she was a rapid metabolizer of several medications based on testing.  Patient is open to repeating the testing in addition to trying to obtain a copy of her former testing and prior records.  We decided to hold off on medication changes for now until repeat testing can be performed.  Patient also mentioned he is meeting with neurologist at [facility] in the past who referred her to something that sounds similar to neuropsych testing.  We were unable to locate the documentation from this but patient believes that the neuropsych testing had shown that her cognitive function was within normal limits and there was not concern of a neurological condition affecting her.  They had mentioned that instead that the obstructive sleep apnea might be affecting her memory.  Patient is going to try to retrieve the records from the neurologist and the neuropsychologist as the records were no longer in the chart.  We also went over the importance of continuing to use her CPAP machine to manage her obstructive sleep apnea.  Associated Signs/Symptoms: Depression Symptoms:  anhedonia, fatigue, difficulty concentrating, impaired  memory, anxiety, loss of energy/fatigue, disturbed sleep, (Hypo) Manic Symptoms:   denies Anxiety Symptoms:  Excessive Worry, Psychotic Symptoms:   Denies PTSD Symptoms: NA  Past Psychiatric History:  Possibly had neuropsych testing in the past around 2023. At which point she believes that the sleep apnea could be affecting her memory instead of something else that the neurologist stated.  Saw a therapist and psychiatrist in the past but none currently. Denies any history of SI or thoughts of self harm. Denies any prior psychiatric hospitalizations.   Has tried Xanax, Abilify, BuSpar, Celexa, Klonopin, Atarax, Lamictal, nortriptyline,trileptal, Topamax, Cymbalta,Paxil, pregablin, risperidone, trintellix, venlafaxine, Viibryd, Vyvanse. Often with negative effects  Denies any substance use.   Previous Psychotropic Medications: Yes   Substance Abuse History in the last 12 months:  No.  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     Past Surgical History:  Procedure Laterality Date   ACHILLES TENDON SURGERY Right 3/16   ANTERIOR CERVICAL DECOMP/DISCECTOMY FUSION N/A 03/11/2016   Procedure: C5-6, C6-7 Anterior Cervical Discectomy and Fusion, Allograft, Plate;  Surgeon: Marybelle Killings, MD;  Location: [facility];  Service: Orthopedics;  Laterality: N/A;   CHOLECYSTECTOMY  2014   COLONOSCOPY WITH PROPOFOL N/A 08/25/2017   Procedure: COLONOSCOPY WITH PROPOFOL;  Surgeon: Manya Silvas, MD;  Location: [facility] ENDOSCOPY;  Service: Endoscopy;  Laterality: N/A;   CYSTO WITH HYDRODISTENSION N/A 01/26/2014   Procedure: CYSTOSCOPY/HYDRODISTENSION;  Surgeon: Bernestine Amass, MD;  Location: [facility];  Service: Urology;  Laterality: N/A;   CYSTO/  URETHRAL DILATION/  HYDRODISTENTION/   INSTILLATION THERAPY  07-16-2010//   12-30-2007//   10-27-2006   ELBOW SURGERY Right    EXERCISE TOLERENCE TEST  10-12-2010   NEGATIVE  ADEQUATE ETT/  NO ISCHEMIA OR EVIDENCE HIGH GRADE OBSTRUCTIVE CAD/  NO FURTHER TEST NEEDED   HYSTEROSCOPY W/  [place] ENDOMETRIAL ABLATION  2014   LAPAROSCOPIC OVARIAN CYST BX  2005   AND  URETEROSCOPIC LASER LITHO  STONE EXTRACTION   SHOULDER OPEN ROTATOR CUFF REPAIR Right 2004   TONSILLECTOMY     TRANSTHORACIC ECHOCARDIOGRAM  06-07-2006   normal study/  ef 60-65%   TUBAL LIGATION Bilateral 1995    Family Psychiatric History: Sister committed suicide in 2017, she had ADHD and depression. Both sister and her brother struggle with substance use disorders.  Family History:  Family History  Problem Relation Age of Onset   Hypertension Mother    Diabetes Father    Brain cancer Father    Clotting disorder Father        blood clots   Fibromyalgia Sister    Suicidality Sister    Hypertension Other        Cancer, Cerebrovascular disease run on mother side of family    Social History:   Social History   Socioeconomic History   Marital status: Divorced    Spouse name: Not on file   Number of  children: 3   Years of education: HS   Highest education level: 12th grade  Occupational History   Occupation: Merchandiser, retail: UNEMPLOYED    Comment: Disability  Tobacco Use   Smoking status: Never   Smokeless tobacco: Never  Vaping Use   Vaping Use: Never used  Substance and Sexual Activity   Alcohol use: No   Drug use: No   Sexual activity: Not on file  Other Topics Concern   Not on file  Social History Narrative   Patient is right-handed. She avoids caffeine. She has recently been using the treadmill.   Social Determinants of Health   Financial Resource Strain:  Not on file  Food Insecurity: Not on file  Transportation Needs: Not on file  Physical Activity: Not on file  Stress: Not on file  Social Connections: Not on file    Additional Social History: Grew up with her mom, brother, sister, and step dad, which she did not find out was not her bio dad officially until 2007. Currently lives with her 47 year old son, her middle son who is autistic, and a Environmental health practitioner. She is on disability after getting [place] fever. 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 But after the medical issues she was no longer able to it.   Allergies:   Allergies  Allergen Reactions   Alendronate Sodium Anaphylaxis    Closes throat up   Doxycycline Other (See Comments)   Gadolinium Derivatives Hives, Nausea Only, Nausea And Vomiting and Rash   Iodinated Contrast Media Hives and Swelling   Milnacipran Hcl Other (See Comments)    Makes patient bleed "from everywhere."   Elavil [Amitriptyline Hcl] Other (See Comments)    Hallucinations    Tramadol Palpitations and Other (See Comments)    Tachycardia    Carbapenems Other (See Comments)    "Felt like death" "Felt like death"   Codeine Sulfate Other (See Comments)   Cymbalta [Duloxetine Hcl] Other (See Comments)    Cannot remember    Duloxetine Other (See Comments)    Facial swelling    Lamotrigine    Morphine Other (See Comments)    Caused anxiety attack pt felt like she was going to have a heart attack Tachycardia per pt   Nsaids Other (See Comments)    Upset stomach Upset stomach   Other Other (See Comments)    bandaid bandaid bandaid bandaid   Ropinirole     Other reaction(s): Other (See Comments) Patient doesn't remember   Skelaxin Other (See Comments)    Cannot remember   Soolantra [Ivermectin]     Also Ivermectin   Tessalon [Benzonatate] Other (See Comments)    "FEELS LIKE I'M CHOKING"   Diphenhydramine Hcl Anxiety   Gabapentin Nausea Only    Pt was unable to get out of bed, increased appetite  Pt was unable to get out of bed, increased appetite  Pt was unable to get out of bed, increased appetite    Oxycodone Itching and Rash    SEVERE ITCHING   Oxycodone-Acetaminophen Palpitations   Tetracycline Rash    Metabolic Disorder Labs: No results found for: "HGBA1C", "MPG" No results found for: "PROLACTIN" No results found for: "CHOL", "TRIG", "HDL", "CHOLHDL", "VLDL", "LDLCALC" Lab Results  Component Value Date   TSH 4.940 (H) 07/20/2018    Therapeutic Level Labs: No results found for: "LITHIUM" No results found for: "CBMZ" No results found for: "VALPROATE"  Current Medications: Current Outpatient Medications  Medication Sig Dispense Refill   ACCU-CHEK GUIDE test strip USE 1 (ONE) EACH DAILY FOR GLUCOSE MONITORING     Accu-Chek Softclix Lancets lancets SMARTSIG:1 stick Topical Daily     albuterol (PROVENTIL) (2.5 MG/3ML) 0.083% nebulizer solution USE 1 VIAL IN NEBULIZER EVERY 6 HOURS 120 mL 11   albuterol (VENTOLIN HFA) 108 (90 Base) MCG/ACT inhaler INHALE 2 PUFFS BY MOUTH EVERY 4 TO 6 HOURS AS NEEDED 8.5 each 6   ammonium lactate (LAC-HYDRIN) 12 % lotion APPLY 1 APPLICATION TOPICALLY AS NEEDED FOR DRY SKIN. 400 mL 0   b complex vitamins tablet Take 1 tablet by mouth daily.  calcitonin, salmon, (MIACALCIN/FORTICAL) 200 UNIT/ACT nasal spray  Place 1 spray into alternate nostrils daily.      chlorhexidine (PERIDEX) 0.12 % solution SMARTSIG:By Mouth     Cholecalciferol (VITAMIN D) 50 MCG (2000 UT) CAPS Take 1 capsule by mouth once a week.     clotrimazole-betamethasone (LOTRISONE) cream Apply 1 Application topically 2 (two) times daily as needed.     DEXILANT 60 MG capsule Take 1 capsule by mouth daily as needed.     dicyclomine (BENTYL) 10 MG capsule Take 10 mg by mouth 2 (two) times daily as needed.     EPINEPHrine (EPIPEN 2-PAK) 0.3 mg/0.3 mL IJ SOAJ injection Use as directed 1 each 1   fexofenadine (ALLEGRA) 180 MG tablet Take 180 mg by mouth daily.     fluticasone (FLOVENT HFA) 110 MCG/ACT inhaler INHALE 1 PUFF BY MOUTH TWICE A DAY 12 each 5   HYDROcodone-acetaminophen (NORCO) 10-325 MG tablet Take 1 tablet by mouth every 8 (eight) hours as needed for pain. 90 tablet 0   hydrocortisone valerate cream (WESTCORT) 0.2 % Apply topically 2 (two) times daily.     ketoconazole (NIZORAL) 2 % shampoo Apply 1 application topically 2 (two) times a week.      KRILL OIL PO Take by mouth.     lidocaine (LIDODERM) 5 % Place 1 patch onto the skin daily. Remove & Discard patch within 12 hours or as directed by MD 90 patch 3   magnesium oxide (MAG-OX) 400 MG tablet Take by mouth.     meclizine (ANTIVERT) 25 MG tablet Take by mouth.     ondansetron (ZOFRAN-ODT) 8 MG disintegrating tablet Take 8 mg by mouth 3 (three) times daily.     PRESCRIPTION MEDICATION every 14 (fourteen) days. 2 ALLERGY INJECTIONS EVERY 2 WKS     rizatriptan (MAXALT-MLT) 10 MG disintegrating tablet      SPIRULINA PO Take 1 capsule by mouth daily.     tiZANidine (ZANAFLEX) 2 MG tablet Take 1 tablet (2 mg total) by mouth every 8 (eight) hours as needed. 50 tablet 2   tiZANidine (ZANAFLEX) 4 MG capsule Take by mouth.     valACYclovir (VALTREX) 1000 MG tablet Take by mouth.     No current facility-administered medications for this visit.    Psychiatric Specialty  Exam: Review of Systems  There were no vitals taken for this visit.There is no height or weight on file to calculate BMI.  General Appearance: NA  Eye Contact:  NA  Speech:  Clear and Coherent and Pressured  Volume:  Normal  Mood:  Anxious  Affect:  Congruent  Thought Process:  Coherent and Descriptions of Associations: Circumstantial  Orientation:  Full (Time, Place, and Person)  Thought Content:  Logical  Suicidal Thoughts:  No  Homicidal Thoughts:  No  Memory:  Immediate;   Fair Recent;   Fair Remote;   Fair  Judgement:  Fair  Insight:  Fair  Psychomotor Activity:  NA  Concentration:  Concentration: Poor  Recall:  [place] of Knowledge:Fair  Language: Good  Akathisia:  NA    AIMS (if indicated):  not done  Assets:  Chief Executive Officer Social Support  ADL's:  Intact  Cognition: WNL  Sleep:  Poor   Screenings: GAD-7    Flowsheet Row Office Visit from 10/04/2022 in [facility]  Total GAD-7 Score 14      PHQ2-9    [place] Office Visit from 10/04/2022 in [facility] Office Visit from 09/27/2022 in [facility] Visit from 09/03/2022 in [facility] Visit from 07/30/2022 in [facility] Office Visit from 07/01/2022 in [facility]  PHQ-2 Total Score 2 0 0 2 0  PHQ-9 Total Score 12 -- -- -- --      [place] Visit from 10/04/2022 in [facility] No Risk        Collaboration of Care: Primary Care Provider AEB chart review and Other provider involved in patient's care AEB pulmonologoy, pain specialist, and PMR chart review  Patient/Guardian was advised Release of Information must be obtained prior to any record release in order to collaborate their  care with an outside provider. Patient/Guardian was advised if they have not already done so to contact the registration department to sign all necessary forms in order for [place] to release information regarding their care.   Consent: Patient/Guardian gives verbal consent for treatment and assignment of benefits for services provided during this visit. Patient/Guardian expressed understanding and agreed to proceed.   Vista Mink, MD 1/12/202410:32 AM  90 minutes were spent in chart review, interview, psycho education, counseling, medical decision making, coordination of care and long-term prognosis.  Patient was given opportunity to ask question and all concerns and questions were addressed and answers. Excluding separately billable services.   Virtual Visit via Video Note  I connected with Karina Greer on 10/04/22 at  8:00 AM EST by a video enabled telemedicine application and verified that I am speaking with the correct person using two identifiers.  Location: Patient: Home Provider: Home office   I discussed the limitations of evaluation and management by telemedicine and the availability of in person appointments. The patient expressed understanding and agreed to proceed.   I discussed the assessment and treatment plan with the patient. The patient was provided an opportunity to ask questions and all were answered. The patient agreed with the plan and demonstrated an understanding of the instructions.   The patient was advised to call back or seek an in-person evaluation if the symptoms worsen or if the condition fails to improve as anticipated.  I provided 60 minutes of non-face-to-face time during this encounter.   Vista Mink, MD

## 2022-10-07 ENCOUNTER — Ambulatory Visit (HOSPITAL_COMMUNITY): Payer: Medicaid Other

## 2022-10-07 ENCOUNTER — Ambulatory Visit (HOSPITAL_COMMUNITY): Payer: Medicare Other

## 2022-10-07 ENCOUNTER — Other Ambulatory Visit: Payer: Self-pay

## 2022-10-07 DIAGNOSIS — F411 Generalized anxiety disorder: Secondary | ICD-10-CM | POA: Diagnosis not present

## 2022-10-31 ENCOUNTER — Encounter: Payer: Medicare Other | Attending: Physical Medicine & Rehabilitation | Admitting: Registered Nurse

## 2022-10-31 ENCOUNTER — Other Ambulatory Visit (HOSPITAL_COMMUNITY): Payer: Self-pay

## 2022-10-31 VITALS — BP 118/81 | HR 67 | Ht 63.0 in | Wt 226.8 lb

## 2022-10-31 DIAGNOSIS — Z79899 Other long term (current) drug therapy: Secondary | ICD-10-CM | POA: Insufficient documentation

## 2022-10-31 DIAGNOSIS — M542 Cervicalgia: Secondary | ICD-10-CM | POA: Insufficient documentation

## 2022-10-31 DIAGNOSIS — M25562 Pain in left knee: Secondary | ICD-10-CM | POA: Insufficient documentation

## 2022-10-31 DIAGNOSIS — M7062 Trochanteric bursitis, left hip: Secondary | ICD-10-CM | POA: Diagnosis not present

## 2022-10-31 DIAGNOSIS — M5416 Radiculopathy, lumbar region: Secondary | ICD-10-CM | POA: Diagnosis not present

## 2022-10-31 DIAGNOSIS — G894 Chronic pain syndrome: Secondary | ICD-10-CM | POA: Insufficient documentation

## 2022-10-31 DIAGNOSIS — M797 Fibromyalgia: Secondary | ICD-10-CM | POA: Diagnosis not present

## 2022-10-31 DIAGNOSIS — Z5181 Encounter for therapeutic drug level monitoring: Secondary | ICD-10-CM | POA: Insufficient documentation

## 2022-10-31 DIAGNOSIS — G8929 Other chronic pain: Secondary | ICD-10-CM | POA: Diagnosis not present

## 2022-10-31 MED ORDER — HYDROCODONE-ACETAMINOPHEN 10-325 MG PO TABS
1.0000 | ORAL_TABLET | Freq: Three times a day (TID) | ORAL | 0 refills | Status: DC | PRN
  Filled 2022-10-31: qty 90, 30d supply, fill #0

## 2022-10-31 NOTE — Progress Notes (Signed)
Subjective:    Patient ID: Karina Greer, female    DOB: 02-Nov-1965, 57 y.o.   MRN: [id number]  HPI: Karina Greer is a 57 y.o. female who returns for follow up appointment for chronic pain and medication refill. She states her pain is located in her neck, lower back pain radiating into her left hip and left lower extremity, left hip and left foot pain.  She rates her pain 6. Her current exercise regime is walking and performing stretching exercises.  [name] Morphine equivalent is 30.00 MME.   Last UDS was Performed on 07/01/2022, it was consistent.     Pain Inventory Average Pain 9 Pain Right Now 6 My pain is constant, sharp, stabbing, tingling, and aching  In the last 24 hours, has pain interfered with the following? General activity 5 Relation with others 6 Enjoyment of life 7 What TIME of day is your pain at its worst? morning , daytime, evening, and night Sleep (in general) Fair  Pain is worse with: walking, bending, sitting, inactivity, standing, unsure, and some activites Pain improves with: rest, heat/ice, therapy/exercise, pacing activities, medication, TENS, and injections Relief from Meds: 7  Family History  Problem Relation Age of Onset   Hypertension Mother    Diabetes Father    Brain cancer Father    Clotting disorder Father        blood clots   Fibromyalgia Sister    Suicidality Sister    Hypertension Other        Cancer, Cerebrovascular disease run on mother side of family   Social History   Socioeconomic History   Marital status: Divorced    Spouse name: Not on file   Number of children: 3   Years of education: HS   Highest education level: 12th grade  Occupational History   Occupation: Merchandiser, retail: UNEMPLOYED    Comment: Disability  Tobacco Use   Smoking status: Never   Smokeless tobacco: Never  Vaping Use   Vaping Use: Never used  Substance and Sexual Activity   Alcohol use: No   Drug use: No   Sexual activity: Not on  file  Other Topics Concern   Not on file  Social History Narrative   Patient is right-handed. She avoids caffeine. She has recently been using the treadmill.   Social Determinants of Health   Financial Resource Strain: Not on file  Food Insecurity: Not on file  Transportation Needs: Not on file  Physical Activity: Not on file  Stress: Not on file  Social Connections: Not on file   Past Surgical History:  Procedure Laterality Date   ACHILLES TENDON SURGERY Right 3/16   ANTERIOR CERVICAL DECOMP/DISCECTOMY FUSION N/A 03/11/2016   Procedure: C5-6, C6-7 Anterior Cervical Discectomy and Fusion, Allograft, Plate;  Surgeon: Marybelle Killings, MD;  Location: [facility];  Service: Orthopedics;  Laterality: N/A;   CHOLECYSTECTOMY  2014   COLONOSCOPY WITH PROPOFOL N/A 08/25/2017   Procedure: COLONOSCOPY WITH PROPOFOL;  Surgeon: Manya Silvas, MD;  Location: [facility] ENDOSCOPY;  Service: Endoscopy;  Laterality: N/A;   CYSTO WITH HYDRODISTENSION N/A 01/26/2014   Procedure: CYSTOSCOPY/HYDRODISTENSION;  Surgeon: Bernestine Amass, MD;  Location: [facility];  Service: Urology;  Laterality: N/A;   CYSTO/  URETHRAL DILATION/  HYDRODISTENTION/   INSTILLATION THERAPY  07-16-2010//   12-30-2007//   10-27-2006   ELBOW SURGERY Right    EXERCISE TOLERENCE TEST  10-12-2010   NEGATIVE  ADEQUATE ETT/  NO ISCHEMIA  OR EVIDENCE HIGH GRADE OBSTRUCTIVE CAD/  NO FURTHER TEST NEEDED   HYSTEROSCOPY W/  [place] ENDOMETRIAL ABLATION  2014   LAPAROSCOPIC OVARIAN CYST BX  2005   AND  URETEROSCOPIC LASER LITHO  STONE EXTRACTION   SHOULDER OPEN ROTATOR CUFF REPAIR Right 2004   TONSILLECTOMY     TRANSTHORACIC ECHOCARDIOGRAM  06-07-2006   normal study/  ef 60-65%   TUBAL LIGATION Bilateral 1995   Past Surgical History:  Procedure Laterality Date   ACHILLES TENDON SURGERY Right 3/16   ANTERIOR CERVICAL DECOMP/DISCECTOMY FUSION N/A 03/11/2016   Procedure: C5-6, C6-7 Anterior Cervical Discectomy and Fusion, Allograft,  Plate;  Surgeon: Marybelle Killings, MD;  Location: [facility];  Service: Orthopedics;  Laterality: N/A;   CHOLECYSTECTOMY  2014   COLONOSCOPY WITH PROPOFOL N/A 08/25/2017   Procedure: COLONOSCOPY WITH PROPOFOL;  Surgeon: Manya Silvas, MD;  Location: [facility] ENDOSCOPY;  Service: Endoscopy;  Laterality: N/A;   CYSTO WITH HYDRODISTENSION N/A 01/26/2014   Procedure: CYSTOSCOPY/HYDRODISTENSION;  Surgeon: Bernestine Amass, MD;  Location: [facility];  Service: Urology;  Laterality: N/A;   CYSTO/  URETHRAL DILATION/  HYDRODISTENTION/   INSTILLATION THERAPY  07-16-2010//   12-30-2007//   10-27-2006   ELBOW SURGERY Right    EXERCISE TOLERENCE TEST  10-12-2010   NEGATIVE  ADEQUATE ETT/  NO ISCHEMIA OR EVIDENCE HIGH GRADE OBSTRUCTIVE CAD/  NO FURTHER TEST NEEDED   HYSTEROSCOPY W/  [place] ENDOMETRIAL ABLATION  2014   LAPAROSCOPIC OVARIAN CYST BX  2005   AND  URETEROSCOPIC LASER LITHO  STONE EXTRACTION   SHOULDER OPEN ROTATOR CUFF REPAIR Right 2004   TONSILLECTOMY     TRANSTHORACIC ECHOCARDIOGRAM  06-07-2006   normal study/  ef 60-65%   TUBAL LIGATION Bilateral 1995   Past Medical History:  Diagnosis Date   Anxiety    Anxiety disorder    Arthritis    Blepharitis    Chronic low back pain    Chronic pain    Depression    Diabetes mellitus without complication (HCC)    Dyslipidemia    Family history of blood clots    Fibromyalgia    GERD (gastroesophageal reflux disease)    Headache    hx migraines   History of kidney stones    History of panic attacks    History of renal calculi    IBS (irritable bowel syndrome)    Interstitial cystitis    Lupus ([place])    tested positive for antibodies for lupus. dr to do further tests   Menorrhagia    OSA on CPAP    not used cpap for several weeks   Pneumonia    hx   PONV (postoperative nausea and vomiting)    RLS (restless legs syndrome)    Rosacea    Seasonal asthma    SI (sacroiliac) joint dysfunction    SUI (stress urinary incontinence,  female)    UTI (lower urinary tract infection)    hx   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 02/23/2013   There were no vitals taken for this visit.  Opioid Risk Score:   Fall Risk Score:  `1  Depression screen [facility] 2/9     10/04/2022    8:29 AM 09/27/2022    9:50 AM 09/03/2022   12:46 PM 07/30/2022   10:21 AM 07/01/2022   10:02 AM 05/08/2022    9:58 AM 04/10/2022    9:35 AM  Depression screen PHQ 2/9  Decreased Interest  0 0 1  0 0 0  Down, Depressed, Hopeless  0 0 1 0 0 0  PHQ - 2 Score  0 0 2 0 0 0  Altered sleeping         Tired, decreased energy         Change in appetite         Feeling bad or failure about yourself          Trouble concentrating         Moving slowly or fidgety/restless         Suicidal thoughts         PHQ-9 Score            Information is confidential and restricted. Go to Review Flowsheets to unlock data.    Review of Systems  Musculoskeletal:  Positive for back pain and myalgias.  All other systems reviewed and are negative.     Objective:   Physical Exam Vitals and nursing note reviewed.  Constitutional:      Appearance: Normal appearance.  Neck:     Comments: Cervical Paraspinal Tenderness: C-5-C-6 Cardiovascular:     Rate and Rhythm: Normal rate and regular rhythm.     Pulses: Normal pulses.     Heart sounds: Normal heart sounds.  Pulmonary:     Effort: Pulmonary effort is normal.     Breath sounds: Normal breath sounds.  Musculoskeletal:     Cervical back: Normal range of motion and neck supple.     Comments: Normal Muscle Bulk and Muscle Testing Reveals:  Upper Extremities: Full ROM and Muscle Strength 5/5 Bilateral AC Joint Tenderness Thoracic Paraspinal Tenderness: T-3-T-7 Lumbar Paraspinal Tenderness: L-4-L-5 Mainly Left Side  Left Greater Trochanter Tenderness Lower Extremities: Full ROM and Muscle Strength 5/5 Arises from Table slowly Narrow Based  Gait     Skin:    General: Skin is warm and dry.  Neurological:      Mental Status: She is alert and oriented to person, place, and time.  Psychiatric:        Mood and Affect: Mood normal.        Behavior: Behavior normal.         Assessment & Plan:  1. Lumbago/ Lumbar Spondylosis/ Left Lumbar Radiculitis: Continue to Monitor. 02/08/ 2024 Refilled: HYDROcodone 10/345m one tablet every 8 hours as needed #90.  We will continue the opioid monitoring program, this consists of regular clinic visits, examinations, urine drug screen, pill [place]. 2. Fibromyalgia. Continue Current exercise Regime. 10/31/2022 3. Anxiety and depression:Psychiatry [facility] 10/31/2022 4. Migraines: On Maxalt. Neurology Following. 10/31/2022 5. OSA : PCP Following. Continue to Monitor. 10/31/2022 6. Obesity: Dr RRanell PatrickFollowing: Continue  Healthy Diet Regimen and Continue HEP as Tolerated. 10/31/2022 7. Status Post Cervical Spinal Fusion: C5C6- C-6- C-7 Anterior Cervical Discectomy Fusion and Allograft Plate: Dr. YLorin MercyFollowing. 10/31/2022 8. Cervicalgia/ Cervical Radiculitis/ S/P Cervical Spinal Fusion:  Continue to Monitor Dr. YLorin MercyFollowing. 10/31/2022 9. Muscle Spasm: Continue Tizanidine as needed.10/31/2022 10.Hereditary and Idiopathic Peripheral Neuropathy Continue with Tens Unit. 10/31/2022. 11.Left Greater Trochanteric Bursitis: . Continue to Alternate Ice and heat Therapy. Continue to Monitor. 10/31/2022. 12. Chronic Bilateral Knee Pain: No Complaints Today. Ortho Following. Continue HEP as Tolerated. Continue to Monitor. 10/31/2022 13. Lateral Epicondylitis of  Right Elbow: No complaints today.  Ortho Following. S/P   06/26/2020 Right lateral epicondyle debridement, drilling and repair  By Dr Lorin Mercy. 14.  Neuralgia/ Right Facial Droop: Neurology following. Continue to monitor. 10/31/2022. 15. Polyarthralgia: No complaints today. Continue HEP as tolerated. Continue to monitor. 10/31/2022  16. Paresthesia of Right Lower Extremity: No complaints today. Occasionally: Continue current medication regimen. Continue to monitor. 10/31/2022   F/U in 1 month .

## 2022-11-04 ENCOUNTER — Other Ambulatory Visit: Payer: Self-pay | Admitting: Podiatry

## 2022-11-04 ENCOUNTER — Other Ambulatory Visit: Payer: Self-pay | Admitting: Nurse Practitioner

## 2022-11-04 DIAGNOSIS — J4540 Moderate persistent asthma, uncomplicated: Secondary | ICD-10-CM

## 2022-11-04 DIAGNOSIS — F411 Generalized anxiety disorder: Secondary | ICD-10-CM | POA: Diagnosis not present

## 2022-11-05 ENCOUNTER — Ambulatory Visit: Payer: Medicare Other

## 2022-11-11 DIAGNOSIS — J019 Acute sinusitis, unspecified: Secondary | ICD-10-CM | POA: Diagnosis not present

## 2022-11-18 ENCOUNTER — Ambulatory Visit (HOSPITAL_COMMUNITY): Payer: Medicaid Other | Admitting: Psychiatry

## 2022-11-29 ENCOUNTER — Other Ambulatory Visit (HOSPITAL_COMMUNITY): Payer: Self-pay

## 2022-11-29 ENCOUNTER — Encounter: Payer: Self-pay | Admitting: Registered Nurse

## 2022-11-29 ENCOUNTER — Encounter: Payer: Medicare Other | Admitting: Registered Nurse

## 2022-11-29 ENCOUNTER — Encounter: Payer: Medicare Other | Attending: Physical Medicine & Rehabilitation | Admitting: Registered Nurse

## 2022-11-29 VITALS — BP 120/79 | HR 70 | Ht 63.0 in | Wt 220.0 lb

## 2022-11-29 DIAGNOSIS — Z79899 Other long term (current) drug therapy: Secondary | ICD-10-CM | POA: Insufficient documentation

## 2022-11-29 DIAGNOSIS — G894 Chronic pain syndrome: Secondary | ICD-10-CM | POA: Diagnosis not present

## 2022-11-29 DIAGNOSIS — M79672 Pain in left foot: Secondary | ICD-10-CM | POA: Diagnosis not present

## 2022-11-29 DIAGNOSIS — Z5181 Encounter for therapeutic drug level monitoring: Secondary | ICD-10-CM | POA: Insufficient documentation

## 2022-11-29 DIAGNOSIS — G8929 Other chronic pain: Secondary | ICD-10-CM | POA: Diagnosis not present

## 2022-11-29 DIAGNOSIS — M25562 Pain in left knee: Secondary | ICD-10-CM | POA: Diagnosis not present

## 2022-11-29 DIAGNOSIS — M7062 Trochanteric bursitis, left hip: Secondary | ICD-10-CM | POA: Insufficient documentation

## 2022-11-29 DIAGNOSIS — M25552 Pain in left hip: Secondary | ICD-10-CM | POA: Insufficient documentation

## 2022-11-29 DIAGNOSIS — M797 Fibromyalgia: Secondary | ICD-10-CM | POA: Insufficient documentation

## 2022-11-29 DIAGNOSIS — M5416 Radiculopathy, lumbar region: Secondary | ICD-10-CM | POA: Diagnosis not present

## 2022-11-29 MED ORDER — HYDROCODONE-ACETAMINOPHEN 10-325 MG PO TABS
1.0000 | ORAL_TABLET | Freq: Three times a day (TID) | ORAL | 0 refills | Status: DC | PRN
  Filled 2022-11-29: qty 90, 30d supply, fill #0

## 2022-11-29 NOTE — Progress Notes (Signed)
Subjective:    Patient ID: Karina Greer, female    DOB: 1966-03-14, 57 y.o.   MRN: [id number]  HPI: Karina Greer is a 56 y.o. female who returns for follow up appointment for chronic pain and medication refill. She states her pain is located in her . Lower back radiating into her left hip and left lower extremity, also reports increase intensity of left hip pain denies falling. Left Hip X-ray ordered. She also reports left knee and left foot pain. Reports increase intensity of left foot pain, X-ray ordered. She rates her pain 6. Her current exercise regime is walking and performing stretching exercises.  Karina Greer Morphine equivalent is 30.00 MME.   UDS ordered today    Pain Inventory Average Pain 9 Pain Right Now 6 My pain is constant, sharp, and stabbing  In the last 24 hours, has pain interfered with the following? General activity 6 Relation with others 5 Enjoyment of life 4 What TIME of day is your pain at its worst? morning , daytime, evening, and night Sleep (in general) Poor  Pain is worse with: walking, bending, sitting, inactivity, standing, and some activites Pain improves with: rest, heat/ice, therapy/exercise, pacing activities, and medication Relief from Meds: 7  Family History  Problem Relation Age of Onset   Hypertension Mother    Diabetes Father    Brain cancer Father    Clotting disorder Father        blood clots   Fibromyalgia Sister    Suicidality Sister    Hypertension Other        Cancer, Cerebrovascular disease run on mother side of family   Social History   Socioeconomic History   Marital status: Divorced    Spouse name: Not on file   Number of children: 3   Years of education: HS   Highest education level: 12th grade  Occupational History   Occupation: Merchandiser, retail: UNEMPLOYED    Comment: Disability  Tobacco Use   Smoking status: Never   Smokeless tobacco: Never  Vaping Use   Vaping Use: Never used  Substance and Sexual  Activity   Alcohol use: No   Drug use: No   Sexual activity: Not on file  Other Topics Concern   Not on file  Social History Narrative   Patient is right-handed. She avoids caffeine. She has recently been using the treadmill.   Social Determinants of Health   Financial Resource Strain: Not on file  Food Insecurity: Not on file  Transportation Needs: Not on file  Physical Activity: Not on file  Stress: Not on file  Social Connections: Not on file   Past Surgical History:  Procedure Laterality Date   ACHILLES TENDON SURGERY Right 3/16   ANTERIOR CERVICAL DECOMP/DISCECTOMY FUSION N/A 03/11/2016   Procedure: C5-6, C6-7 Anterior Cervical Discectomy and Fusion, Allograft, Plate;  Surgeon: Marybelle Killings, MD;  Location: [facility];  Service: Orthopedics;  Laterality: N/A;   CHOLECYSTECTOMY  2014   COLONOSCOPY WITH PROPOFOL N/A 08/25/2017   Procedure: COLONOSCOPY WITH PROPOFOL;  Surgeon: Manya Silvas, MD;  Location: [facility] ENDOSCOPY;  Service: Endoscopy;  Laterality: N/A;   CYSTO WITH HYDRODISTENSION N/A 01/26/2014   Procedure: CYSTOSCOPY/HYDRODISTENSION;  Surgeon: Bernestine Amass, MD;  Location: [facility];  Service: Urology;  Laterality: N/A;   CYSTO/  URETHRAL DILATION/  HYDRODISTENTION/   INSTILLATION THERAPY  07-16-2010//   12-30-2007//   10-27-2006   ELBOW SURGERY Right    EXERCISE TOLERENCE  TEST  10-12-2010   NEGATIVE  ADEQUATE ETT/  NO ISCHEMIA OR EVIDENCE HIGH GRADE OBSTRUCTIVE CAD/  NO FURTHER TEST NEEDED   HYSTEROSCOPY W/  [place] ENDOMETRIAL ABLATION  2014   LAPAROSCOPIC OVARIAN CYST BX  2005   AND  URETEROSCOPIC LASER LITHO  STONE EXTRACTION   SHOULDER OPEN ROTATOR CUFF REPAIR Right 2004   TONSILLECTOMY     TRANSTHORACIC ECHOCARDIOGRAM  06-07-2006   normal study/  ef 60-65%   TUBAL LIGATION Bilateral 1995   Past Surgical History:  Procedure Laterality Date   ACHILLES TENDON SURGERY Right 3/16   ANTERIOR CERVICAL DECOMP/DISCECTOMY FUSION N/A 03/11/2016    Procedure: C5-6, C6-7 Anterior Cervical Discectomy and Fusion, Allograft, Plate;  Surgeon: Marybelle Killings, MD;  Location: [facility];  Service: Orthopedics;  Laterality: N/A;   CHOLECYSTECTOMY  2014   COLONOSCOPY WITH PROPOFOL N/A 08/25/2017   Procedure: COLONOSCOPY WITH PROPOFOL;  Surgeon: Manya Silvas, MD;  Location: [facility] ENDOSCOPY;  Service: Endoscopy;  Laterality: N/A;   CYSTO WITH HYDRODISTENSION N/A 01/26/2014   Procedure: CYSTOSCOPY/HYDRODISTENSION;  Surgeon: Bernestine Amass, MD;  Location: [facility];  Service: Urology;  Laterality: N/A;   CYSTO/  URETHRAL DILATION/  HYDRODISTENTION/   INSTILLATION THERAPY  07-16-2010//   12-30-2007//   10-27-2006   ELBOW SURGERY Right    EXERCISE TOLERENCE TEST  10-12-2010   NEGATIVE  ADEQUATE ETT/  NO ISCHEMIA OR EVIDENCE HIGH GRADE OBSTRUCTIVE CAD/  NO FURTHER TEST NEEDED   HYSTEROSCOPY W/  [place] ENDOMETRIAL ABLATION  2014   LAPAROSCOPIC OVARIAN CYST BX  2005   AND  URETEROSCOPIC LASER LITHO  STONE EXTRACTION   SHOULDER OPEN ROTATOR CUFF REPAIR Right 2004   TONSILLECTOMY     TRANSTHORACIC ECHOCARDIOGRAM  06-07-2006   normal study/  ef 60-65%   TUBAL LIGATION Bilateral 1995   Past Medical History:  Diagnosis Date   Anxiety    Anxiety disorder    Arthritis    Blepharitis    Chronic low back pain    Chronic pain    Depression    Diabetes mellitus without complication (HCC)    Dyslipidemia    Family history of blood clots    Fibromyalgia    GERD (gastroesophageal reflux disease)    Headache    hx migraines   History of kidney stones    History of panic attacks    History of renal calculi    IBS (irritable bowel syndrome)    Interstitial cystitis    Lupus ([place])    tested positive for antibodies for lupus. dr to do further tests   Menorrhagia    OSA on CPAP    not used cpap for several weeks   Pneumonia    hx   PONV (postoperative nausea and vomiting)    RLS (restless legs syndrome)    Rosacea    Seasonal asthma     SI (sacroiliac) joint dysfunction    SUI (stress urinary incontinence, female)    UTI (lower urinary tract infection)    hx   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 02/23/2013   BP 120/79   Pulse 70   Ht '5\' 3"'$  (1.6 m)   Wt 220 lb (99.8 kg)   SpO2 97%   BMI 38.97 kg/m   Opioid Risk Score:   Fall Risk Score:  `1  Depression screen [facility] 2/9     10/04/2022    8:29 AM 09/27/2022    9:50 AM 09/03/2022   12:46 PM  07/30/2022   10:21 AM 07/01/2022   10:02 AM 05/08/2022    9:58 AM 04/10/2022    9:35 AM  Depression screen PHQ 2/9  Decreased Interest  0 0 1 0 0 0  Down, Depressed, Hopeless  0 0 1 0 0 0  PHQ - 2 Score  0 0 2 0 0 0  Altered sleeping         Tired, decreased energy         Change in appetite         Feeling bad or failure about yourself          Trouble concentrating         Moving slowly or fidgety/restless         Suicidal thoughts         PHQ-9 Score            Information is confidential and restricted. Go to Review Flowsheets to unlock data.      Review of Systems  Musculoskeletal:  Positive for arthralgias, back pain, myalgias and neck pain.       B/L legs,feet,hips  All other systems reviewed and are negative.      Objective:   Physical Exam Vitals and nursing note reviewed.  Constitutional:      Appearance: Normal appearance. She is obese.  Cardiovascular:     Rate and Rhythm: Normal rate and regular rhythm.     Pulses: Normal pulses.     Heart sounds: Normal heart sounds.  Pulmonary:     Effort: Pulmonary effort is normal.     Breath sounds: Normal breath sounds.  Musculoskeletal:     Cervical back: Normal range of motion and neck supple.     Comments: Normal Muscle Bulk and Muscle Testing Reveals:  Upper Extremities: Full ROM and Muscle Strength 5/5 Thoracic Paraspinal Tenderness: T-1-T-3 Lumbar Paraspinal Tenderness: L-4-L-5  Left Greater Trochanter Tenderness Lower Extremities: Full ROM and Muscle Strength 5/5 Arises from Chair  slowly Narrow Based   Gait     Skin:    General: Skin is warm and dry.  Neurological:     Mental Status: She is alert and oriented to person, place, and time.  Psychiatric:        Mood and Affect: Mood normal.        Behavior: Behavior normal.           Assessment & Plan:  1. Lumbago/ Lumbar Spondylosis/ Left Lumbar Radiculitis: Continue to Monitor. 03/08/ 2024 Refilled: HYDROcodone 10/'325mg'$  one tablet every 8 hours as needed #90.  We will continue the opioid monitoring program, this consists of regular clinic visits, examinations, urine drug screen, pill counts as well as use of [place] Controlled Substance Reporting system. A 12 month History has been reviewed on the [place] on 11/29/2022. 2. Fibromyalgia. Continue Current exercise Regime. 11/29/2022 3. Anxiety and depression:Psychiatry Following: Counselor Jessica  at the [place] . Continue Counseling at [facility]. 11/29/2022 4. Migraines: On Maxalt. Neurology Following. 11/29/2022 5. OSA : PCP Following. Continue to Monitor. 11/29/2022 6. Obesity: Dr Ranell Patrick Following: Continue  Healthy Diet Regimen and Continue HEP as Tolerated. 11/29/2022 7. Status Post Cervical Spinal Fusion: C5C6- C-6- C-7 Anterior Cervical Discectomy Fusion and Allograft Plate: Dr. Lorin Mercy Following. 11/29/2022 8. Cervicalgia/ Cervical Radiculitis/ S/P Cervical Spinal Fusion:  Continue to Monitor Dr. Lorin Mercy Following. 11/29/2022 9. Muscle Spasm: Continue Tizanidine as needed.11/29/2022 10.Hereditary and Idiopathic Peripheral Neuropathy Continue with Tens Unit. 11/29/2022. 11.Left  Greater Trochanteric Bursitis: . Continue to Alternate Ice and heat Therapy. Continue to Monitor. 11/29/2022. 12. Chronic Bilateral Knee Pain: No Complaints Today. Ortho Following. Continue HEP as Tolerated. Continue to Monitor. 11/29/2022 13. Lateral Epicondylitis of Right Elbow: No complaints today.  Ortho Following.  S/P   06/26/2020 Right lateral epicondyle debridement, drilling and repair  By Dr Lorin Mercy. 14. Neuralgia/ Right Facial Droop: Neurology following. Continue to monitor. 11/29/2022. 15. Polyarthralgia: No complaints today. Continue HEP as tolerated. Continue to monitor. 11/29/2022  16. Paresthesia of Right Lower Extremity: No complaints today. Occasionally: Continue current medication regimen. Continue to monitor. 10/31/2022 17. Left Hip Pain: RX: Hip Xray 18. Left Foot Pain: RX: Left Foot X-ray  F/U in 1 month .

## 2022-12-04 LAB — TOXASSURE SELECT,+ANTIDEPR,UR

## 2022-12-09 ENCOUNTER — Ambulatory Visit
Admission: RE | Admit: 2022-12-09 | Discharge: 2022-12-09 | Disposition: A | Discharge status: Home / Self Care | Payer: Medicare Other | Source: Ambulatory Visit | Attending: Registered Nurse | Admitting: Registered Nurse

## 2022-12-09 DIAGNOSIS — M79672 Pain in left foot: Secondary | ICD-10-CM | POA: Diagnosis not present

## 2022-12-09 DIAGNOSIS — M25552 Pain in left hip: Secondary | ICD-10-CM | POA: Diagnosis not present

## 2022-12-10 DIAGNOSIS — I700 Atherosclerosis of aorta: Secondary | ICD-10-CM | POA: Diagnosis not present

## 2022-12-10 DIAGNOSIS — G43009 Migraine without aura, not intractable, without status migrainosus: Secondary | ICD-10-CM | POA: Diagnosis not present

## 2022-12-10 DIAGNOSIS — K219 Gastro-esophageal reflux disease without esophagitis: Secondary | ICD-10-CM | POA: Diagnosis not present

## 2022-12-10 DIAGNOSIS — I708 Atherosclerosis of other arteries: Secondary | ICD-10-CM | POA: Diagnosis not present

## 2022-12-10 DIAGNOSIS — M810 Age-related osteoporosis without current pathological fracture: Secondary | ICD-10-CM | POA: Diagnosis not present

## 2022-12-10 DIAGNOSIS — E119 Type 2 diabetes mellitus without complications: Secondary | ICD-10-CM | POA: Diagnosis not present

## 2022-12-10 DIAGNOSIS — E7800 Pure hypercholesterolemia, unspecified: Secondary | ICD-10-CM | POA: Diagnosis not present

## 2022-12-10 DIAGNOSIS — E559 Vitamin D deficiency, unspecified: Secondary | ICD-10-CM | POA: Diagnosis not present

## 2022-12-11 ENCOUNTER — Telehealth: Payer: Self-pay | Admitting: *Deleted

## 2022-12-11 NOTE — Telephone Encounter (Signed)
Urine drug screen for this encounter is consistent for prescribed medication 

## 2022-12-16 ENCOUNTER — Ambulatory Visit (HOSPITAL_COMMUNITY): Payer: Medicaid Other | Admitting: Psychiatry

## 2022-12-26 ENCOUNTER — Encounter: Payer: Medicare Other | Admitting: Registered Nurse

## 2022-12-27 ENCOUNTER — Encounter: Payer: Self-pay | Admitting: Registered Nurse

## 2022-12-27 ENCOUNTER — Encounter: Payer: Medicare Other | Attending: Physical Medicine & Rehabilitation | Admitting: Registered Nurse

## 2022-12-27 ENCOUNTER — Other Ambulatory Visit (HOSPITAL_COMMUNITY): Payer: Self-pay

## 2022-12-27 VITALS — BP 127/81 | HR 74 | Ht 63.0 in | Wt 222.4 lb

## 2022-12-27 DIAGNOSIS — Z5181 Encounter for therapeutic drug level monitoring: Secondary | ICD-10-CM | POA: Insufficient documentation

## 2022-12-27 DIAGNOSIS — Z79899 Other long term (current) drug therapy: Secondary | ICD-10-CM | POA: Diagnosis not present

## 2022-12-27 DIAGNOSIS — G8929 Other chronic pain: Secondary | ICD-10-CM | POA: Diagnosis not present

## 2022-12-27 DIAGNOSIS — G894 Chronic pain syndrome: Secondary | ICD-10-CM | POA: Diagnosis not present

## 2022-12-27 DIAGNOSIS — M25511 Pain in right shoulder: Secondary | ICD-10-CM | POA: Diagnosis not present

## 2022-12-27 DIAGNOSIS — M79672 Pain in left foot: Secondary | ICD-10-CM | POA: Insufficient documentation

## 2022-12-27 DIAGNOSIS — M5416 Radiculopathy, lumbar region: Secondary | ICD-10-CM | POA: Insufficient documentation

## 2022-12-27 DIAGNOSIS — M542 Cervicalgia: Secondary | ICD-10-CM | POA: Diagnosis not present

## 2022-12-27 DIAGNOSIS — M797 Fibromyalgia: Secondary | ICD-10-CM | POA: Diagnosis not present

## 2022-12-27 DIAGNOSIS — M7062 Trochanteric bursitis, left hip: Secondary | ICD-10-CM | POA: Diagnosis not present

## 2022-12-27 DIAGNOSIS — M7732 Calcaneal spur, left foot: Secondary | ICD-10-CM | POA: Diagnosis not present

## 2022-12-27 MED ORDER — HYDROCODONE-ACETAMINOPHEN 10-325 MG PO TABS
1.0000 | ORAL_TABLET | Freq: Three times a day (TID) | ORAL | 0 refills | Status: DC | PRN
  Filled 2022-12-27: qty 90, 30d supply, fill #0

## 2022-12-27 NOTE — Patient Instructions (Signed)
[facility] at [facility] in [place], [place]  Address: [address], [place], [place] [address] Hours:  Open ? Closes 2:30?PM Phone: [phone]

## 2022-12-27 NOTE — Progress Notes (Signed)
Subjective:    Patient ID: Karina Greer, female    DOB: [birth year], 57 y.o.   MRN: [id number]  HPI: Karina Greer Greer a 57 y.o. female who returns for follow up appointment for chronic pain and medication refill. She states her  pain Greer located in her neck, right shoulder, she denies falling. She also reports  lower back pain radiating into her left hip and left lower extremity. She rates her pain 6. Her current exercise regime Greer walking and performing stretching exercises.  Karina Greer.   Last UDS was Performed on 11/29/2022, it was consistent.     Pain Inventory Average Pain 9 Pain Right Now 6 My pain Greer constant, sharp, burning, dull, stabbing, tingling, and aching  In the last 24 hours, has pain interfered with the following? General activity 7 Relation with others 5 Enjoyment of life 5 What TIME of day Greer your pain at its worst? morning , daytime, evening, and night Sleep (in general) Fair  Pain Greer worse with: walking, bending, sitting, inactivity, standing, and some activites Pain improves with: rest, heat/ice, therapy/exercise, pacing activities, medication, TENS, and injections Relief from Meds: 7  Family History  Problem Relation Age of Onset   Hypertension Mother    Diabetes Father    Brain cancer Father    Clotting disorder Father        blood clots   Fibromyalgia Sister    Suicidality Sister    Hypertension Other        Cancer, Cerebrovascular disease run on mother side of family   Social History   Socioeconomic History   Marital status: Divorced    Spouse name: Not on file   Number of children: 3   Years of education: HS   Highest education level: 12th grade  Occupational History   Occupation: Designer, industrial/product: UNEMPLOYED    Comment: Disability  Tobacco Use   Smoking status: Never   Smokeless tobacco: Never  Vaping Use   Vaping Use: Never used  Substance and Sexual Activity   Alcohol use: No   Drug use:  No   Sexual activity: Not on file  Other Topics Concern   Not on file  Social History Narrative   Patient Greer right-handed. She avoids caffeine. She has recently been using the treadmill.   Social Determinants of Health   Financial Resource Strain: Not on file  Food Insecurity: Not on file  Transportation Needs: Not on file  Physical Activity: Not on file  Stress: Not on file  Social Connections: Not on file   Past Surgical History:  Procedure Laterality Date   ACHILLES TENDON SURGERY Right 3/16   ANTERIOR CERVICAL DECOMP/DISCECTOMY FUSION N/A 03/11/2016   Procedure: C5-6, C6-7 Anterior Cervical Discectomy and Fusion, Allograft, Plate;  Surgeon: Eldred Manges, MD;  Location: [facility] OR;  Service: Orthopedics;  Laterality: N/A;   CHOLECYSTECTOMY  2014   COLONOSCOPY WITH PROPOFOL N/A 08/25/2017   Procedure: COLONOSCOPY WITH PROPOFOL;  Surgeon: Scot Jun, MD;  Location: [facility] ENDOSCOPY;  Service: Endoscopy;  Laterality: N/A;   CYSTO WITH HYDRODISTENSION N/A 01/26/2014   Procedure: CYSTOSCOPY/HYDRODISTENSION;  Surgeon: [name], MD;  Location: [facility];  Service: Urology;  Laterality: N/A;   CYSTO/  URETHRAL DILATION/  HYDRODISTENTION/   INSTILLATION THERAPY  07-16-2010//   12-30-2007//   10-27-2006   ELBOW SURGERY Right    EXERCISE TOLERENCE TEST  10-12-2010   NEGATIVE  ADEQUATE  ETT/  NO ISCHEMIA OR EVIDENCE HIGH GRADE OBSTRUCTIVE CAD/  NO FURTHER TEST NEEDED   HYSTEROSCOPY W/  NOVASURE ENDOMETRIAL ABLATION  2014   LAPAROSCOPIC OVARIAN CYST BX  2005   AND  URETEROSCOPIC LASER LITHO  STONE EXTRACTION   SHOULDER OPEN ROTATOR CUFF REPAIR Right 2004   TONSILLECTOMY     TRANSTHORACIC ECHOCARDIOGRAM  06-07-2006   normal study/  ef 60-65%   TUBAL LIGATION Bilateral 1995   Past Surgical History:  Procedure Laterality Date   ACHILLES TENDON SURGERY Right 3/16   ANTERIOR CERVICAL DECOMP/DISCECTOMY FUSION N/A 03/11/2016   Procedure: C5-6, C6-7 Anterior Cervical  Discectomy and Fusion, Allograft, Plate;  Surgeon: Eldred MangesMark C Yates, MD;  Location: [facility] OR;  Service: Orthopedics;  Laterality: N/A;   CHOLECYSTECTOMY  2014   COLONOSCOPY WITH PROPOFOL N/A 08/25/2017   Procedure: COLONOSCOPY WITH PROPOFOL;  Surgeon: Scot JunElliott, Robert T, MD;  [facility];  Service: Endoscopy;  Laterality: N/A;   CYSTO WITH HYDRODISTENSION N/A 01/26/2014   Procedure: CYSTOSCOPY/HYDRODISTENSION;  Surgeon: Valetta Fulleravid S Grapey, MD;  [facility];  Service: Urology;  Laterality: N/A;   CYSTO/  URETHRAL DILATION/  HYDRODISTENTION/   INSTILLATION THERAPY  07-16-2010//   12-30-2007//   10-27-2006   ELBOW SURGERY Right    EXERCISE TOLERENCE TEST  10-12-2010   NEGATIVE  ADEQUATE ETT/  NO ISCHEMIA OR EVIDENCE HIGH GRADE OBSTRUCTIVE CAD/  NO FURTHER TEST NEEDED   HYSTEROSCOPY W/  NOVASURE ENDOMETRIAL ABLATION  2014   LAPAROSCOPIC OVARIAN CYST BX  2005   AND  URETEROSCOPIC LASER LITHO  STONE EXTRACTION   SHOULDER OPEN ROTATOR CUFF REPAIR Right 2004   TONSILLECTOMY     TRANSTHORACIC ECHOCARDIOGRAM  06-07-2006   normal study/  ef 60-65%   TUBAL LIGATION Bilateral 1995   Past Medical History:  Diagnosis Date   Anxiety    Anxiety disorder    Arthritis    Blepharitis    Chronic low back pain    Chronic pain    Depression    Diabetes mellitus without complication    Dyslipidemia    Family history of blood clots    Fibromyalgia    GERD (gastroesophageal reflux disease)    Headache    hx migraines   History of kidney stones    History of panic attacks    History of renal calculi    IBS (irritable bowel syndrome)    Interstitial cystitis    Lupus    tested positive for antibodies for lupus. dr to do further tests   Menorrhagia    OSA on CPAP    not used cpap for several weeks   Pneumonia    hx   PONV (postoperative nausea and vomiting)    RLS (restless legs syndrome)    Rosacea    Seasonal asthma    SI (sacroiliac) joint dysfunction    SUI (stress  urinary incontinence, female)    UTI (lower urinary tract infection)    hx   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 02/23/2013   BP 127/81   Pulse 74   Ht 5\' 3"  (1.6 m)   Wt 222 lb 6.4 oz (100.9 kg)   SpO2 94%   BMI 39.40 kg/m   Opioid Risk Score:   Fall Risk Score:  `1  Dep[facility] 2/[facility]     12/27/2022    8:42 AM 11/29/2022   10:37 AM 10/04/2022    8:29 AM 09/27/2022    9:50 AM 09/03/2022  12:46 PM 07/30/2022   10:21 AM 07/01/2022   10:02 AM  Depression screen PHQ 2/9  Decreased Interest 0 0  0 0 1 0  Down, Depressed, Hopeless 0 0  0 0 1 0  PHQ - 2 Score 0 0  0 0 2 0  Altered sleeping         Tired, decreased energy         Change in appetite         Feeling bad or failure about yourself          Trouble concentrating         Moving slowly or fidgety/restless         Suicidal thoughts         PHQ-9 Score            Information Greer confidential and restricted. Go to Review Flowsheets to unlock data.     Review of Systems  Constitutional: Negative.   HENT: Negative.    Eyes: Negative.   Respiratory: Negative.    Cardiovascular: Negative.   Gastrointestinal: Negative.   Endocrine: Negative.   Genitourinary: Negative.   Musculoskeletal:  Positive for back pain and myalgias.  Skin: Negative.   Allergic/Immunologic: Negative.   Neurological: Negative.   Hematological: Negative.   Psychiatric/Behavioral: Negative.    All other systems reviewed and are negative.      Objective:   Physical Exam Vitals and nursing note reviewed.  Constitutional:      Appearance: Normal appearance. She Greer obese.  Cardiovascular:     Rate and Rhythm: Normal rate and regular rhythm.     Pulses: Normal pulses.     Heart sounds: Normal heart sounds.  Pulmonary:     Effort: Pulmonary effort Greer normal.     Breath sounds: Normal breath sounds.  Musculoskeletal:     Cervical back: Normal range of motion and neck supple.     Comments: Normal Muscle Bulk and Muscle Testing  Reveals:  Upper Extremities: Full ROM and Muscle Strength  5/5 Right AC Joint Tenderness Lumbar Paraspinal Tenderness: L-4-L-5 Left Greater Trochanter Tenderness Lower Extremities: Full ROM and Muscle Strength 5/5 Left Lower extremity Flexion Produces Pain into her Left Patella Arises from Chair slowly Narrow Based  Gait     Skin:    General: Skin Greer warm and dry.  Neurological:     Mental Status: She Greer alert and oriented to person, place, and time.  Psychiatric:        Mood and Affect: Mood normal.        Behavior: Behavior normal.         Assessment & Plan:  1. Lumbago/ Lumbar Spondylosis/ Left Lumbar Radiculitis: Continue to Monitor. 04/05/ 2024 Refilled: HYDROcodone 10/325mg  one tablet every 8 hours as needed #90.  We will continue the opioid monitoring program, this consists of regular clinic visits, examinations, urine drug screen, pill counts as well as use of [place] Controlled Substance Reporting system. A 12 month History has been reviewed on the [place] Controlled Substance Reporting System on 12/27/2022. 2. Fibromyalgia. Continue Current exercise Regime. 12/27/2022 3. Anxiety and depression:Psychiatry Following: Counselor Jessica  at [facility] . Continue Counseling at [facility]. 12/27/2022 4. Migraines: On Maxalt. Neurology Following. 12/27/2022 5. OSA : PCP Following. Continue to Monitor. 12/27/2022 6. Obesity: Dr Carlis Abbott Following: Continue  Healthy Diet Regimen and Continue HEP as Tolerated. 12/27/2022 7. Status Post Cervical Spinal Fusion: C5C6- C-6- C-7 Anterior Cervical Discectomy Fusion and  Allograft Plate: Dr. Ophelia CharterYates Following. 12/27/2022 8. Cervicalgia/ Cervical Radiculitis/ S/P Cervical Spinal Fusion:  Continue to Monitor Dr. Ophelia CharterYates Following. 12/27/2022 9. Muscle Spasm: Continue Tizanidine as needed.12/27/2022 10.Hereditary and Idiopathic Peripheral Neuropathy Continue with Tens Unit. 12/27/2022. 11.Left Greater Trochanteric  Bursitis: . Continue to Alternate Ice and heat Therapy. Continue to Monitor. 12/27/2022. 12. Chronic Bilateral Knee Pain: No Complaints Today. Ortho Following. Continue HEP as Tolerated. Continue to Monitor. 12/27/2022 13. Lateral Epicondylitis of Right Elbow: No complaints today.  Ortho Following. S/P   06/26/2020 Right lateral epicondyle debridement, drilling and repair  By Dr Ophelia CharterYates. 14. Neuralgia/ Right Facial Droop: Neurology following. Continue to monitor. 12/27/2022. 15. Polyarthralgia: No complaints today. Continue HEP as tolerated. Continue to monitor. 12/27/2022  16. Paresthesia of Right Lower Extremity: No complaints today. Occasionally: Continue current medication regimen. Continue to monitor. 12/27/2022 17 . Left Calcaneal Spur: X-ray Reviewed. Karina Greer will F/U with her Podiatrist Dr Allena KatzPatel. She verbalizes understanding.   F/U in 1 month .

## 2023-01-03 ENCOUNTER — Ambulatory Visit (INDEPENDENT_AMBULATORY_CARE_PROVIDER_SITE_OTHER): Payer: Medicare Other

## 2023-01-03 ENCOUNTER — Ambulatory Visit (INDEPENDENT_AMBULATORY_CARE_PROVIDER_SITE_OTHER): Payer: Medicare Other | Admitting: Podiatry

## 2023-01-03 ENCOUNTER — Encounter: Payer: Self-pay | Admitting: Podiatry

## 2023-01-03 DIAGNOSIS — M7661 Achilles tendinitis, right leg: Secondary | ICD-10-CM

## 2023-01-03 DIAGNOSIS — M79672 Pain in left foot: Secondary | ICD-10-CM

## 2023-01-03 DIAGNOSIS — M7662 Achilles tendinitis, left leg: Secondary | ICD-10-CM

## 2023-01-03 DIAGNOSIS — M79671 Pain in right foot: Secondary | ICD-10-CM

## 2023-01-03 MED ORDER — TRIAMCINOLONE ACETONIDE 10 MG/ML IJ SUSP
10.0000 mg | Freq: Once | INTRAMUSCULAR | Status: AC
  Administered 2023-01-03: 10 mg

## 2023-01-05 NOTE — Progress Notes (Signed)
Subjective:   Patient ID: Karina Greer, female   DOB: 57 y.o.   MRN: [id number]   HPI Patient states she has developed acute Achilles tendinitis left over right and has trouble being able to walk and states that it seems to be getting gradually more of an issue for her.  She has tried ice therapy she has tried heel lifts and shoe gear modifications   ROS      Objective:  Physical Exam  Neurovascular status intact exquisite discomfort lateral side Achilles at the insertional point tendon into the calcaneus left over right with moderate equinus condition.  Good digital perfusion well oriented with swelling associated with condition     Assessment:  Acute Achilles tendinitis left over right with inflammation fluid inability to walk comfortably on the left leg     Plan:  H&P x-rays reviewed condition discussed and at this point I did discuss injection explaining chances for risk and because of the amount of pain she is in the importance of immobilization.  I went ahead today did sterile prep and injected the Achilles tendon insertion left at calcaneus 3 mg Dexasone Kenalog 5 mg Xylocaine lateral side staying away from the central medial portion of the tendon and applied air fracture walker that was fitted properly to her lower leg to immobilize the lower leg.  Reappoint to recheck 4 weeks or earlier if needed  X-rays indicate small spur formations no indication stress fracture arthritis

## 2023-01-07 ENCOUNTER — Other Ambulatory Visit: Payer: Self-pay | Admitting: Podiatry

## 2023-01-07 DIAGNOSIS — M79671 Pain in right foot: Secondary | ICD-10-CM

## 2023-01-07 DIAGNOSIS — M79672 Pain in left foot: Secondary | ICD-10-CM

## 2023-01-07 DIAGNOSIS — M7662 Achilles tendinitis, left leg: Secondary | ICD-10-CM

## 2023-01-08 ENCOUNTER — Telehealth: Payer: Self-pay | Admitting: *Deleted

## 2023-01-08 ENCOUNTER — Ambulatory Visit (HOSPITAL_BASED_OUTPATIENT_CLINIC_OR_DEPARTMENT_OTHER): Payer: Medicare Other | Admitting: Psychiatry

## 2023-01-08 DIAGNOSIS — F411 Generalized anxiety disorder: Secondary | ICD-10-CM | POA: Diagnosis not present

## 2023-01-08 DIAGNOSIS — G4733 Obstructive sleep apnea (adult) (pediatric): Secondary | ICD-10-CM | POA: Diagnosis not present

## 2023-01-08 DIAGNOSIS — E039 Hypothyroidism, unspecified: Secondary | ICD-10-CM

## 2023-01-08 DIAGNOSIS — F609 Personality disorder, unspecified: Secondary | ICD-10-CM | POA: Diagnosis not present

## 2023-01-08 DIAGNOSIS — M797 Fibromyalgia: Secondary | ICD-10-CM

## 2023-01-08 MED ORDER — LAMOTRIGINE 25 MG PO TABS
25.0000 mg | ORAL_TABLET | Freq: Every day | ORAL | 2 refills | Status: DC
Start: 2023-01-08 — End: 2023-05-14

## 2023-01-08 NOTE — Progress Notes (Unsigned)
BH MD/PA/NP OP Progress Note  01/09/2023 10:10 AM Karina Greer  MRN:  [id number]  Visit Diagnosis:    ICD-10-CM   1. GENERALIZED ANXIETY DISORDER  F41.1 lamoTRIgine (LAMICTAL) 25 MG tablet    2. 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  F60.9     3. Acquired hypothyroidism  E03.9     4. Fibromyalgia  M79.7     5. OSA (obstructive sleep apnea)  G47.33       Assessment: Karina Greer is a 57 y.o. female with a history of OSA, fibromyalgia, hypothyroidism, and restless leg syndrome who presented to [facility] for initial evaluation on 10/04/2022.  During initial evaluation patient reported symptoms consistent with anxiety including excessive worry about multiple things, difficulty relaxing, increased irritability, racing thoughts, and fears that something awful happen.  In addition patient endorsed poor memory, difficulty concentrating, lack of focus, difficulty organizing task, and flight of ideas.  Of note patient has a history of obstructive sleep apnea, hypothyroidism, and fibromyalgia which could potentially contribute to her symptoms as well.  In addition MRI from 2021 has shown progression in white matter changes over 2 year period possibly secondary to atherosclerotic vascular disease or due to demyelinating disease.  Patient reported that she had neuropsych testing which had eliminated this concern however we were unable to review the relevant records.  Patient met criteria for generalized anxiety disorder in addition to having many traits consistent with BPD.   Karina Greer presents for follow-up evaluation. Today, 01/09/23, patient reports struggling with symptoms of mood lability, irritability, and anxiety since her last visit. We did review her gene site testing and past records from the [facility] today. One focus being her multiple past medication trials and failures, despite testing showing she is a normal medication metabolized.  Borderline personality disorder was reviewed as she had been diagnosed with this by a provider in the past. She endorsed symptoms of splitting, unstable interpersonal relationships, impulsive spending, mood lability, and inappropriate anger. Based off her meeting these criteria therapy and group work such as DBT/PHP were encouraged. Patient was open to both and was provided with referrals. We also will start on Lamictal 25 mg QD and monitor for potential side effects. Patient had tried this in the past but was unable to recall the side effects she had experienced. She was interested in giving the medication another trial.  Plan: - Start Lamictal 25 mg QD - Therapy referral - PHP referral - Genesite testing completed and reviewed - Vit D, homocysteine, CMP, CBC PTH, TSH, reviewed - Brain MRI form 07/2020 reviewed - Patient to work on getting records from her former psychiatrist and neurologist/neuropsychologist - Crisis resources reviewed - Follow up in 4-6 weeks   Chief Complaint:  Chief Complaint  Patient presents with   Follow-up   HPI: Karina Greer presents today to discuss her genesite testing in addition to the review from her past medical records which had been sent over in the interim.  Of note patient's gene site testing had showed that she was normal metabolizer of multiple medications, several of which she had reported adverse side effects to in the past.  Testing also showed that her MTHFR metabolism was within normal limits and there was not a delay as had been previously shown on genetic testing.  On review of patient's past records from [facility] it was noted that she had tried multiple medications several which she had developed side effects from after  a period of days to weeks, leading to her discontinuing the medication.  It had been mentioned that patient may be experiencing being immediate and often incongruent side effects secondary to her anxiety and mental state as opposed  to the medications.  We did review this with the patient today about how often times the reaction she has medications are either atypical or undocumented potential side effects.  Patient acknowledged this as a possibility, but does note extreme anxiety about trying new medications.  Of the testing results the only medications that she had not tried yet with adverse effects were Fetzima and Rexulti.  We did review these meds but patient was hesitant to try them due to experiencing irritability whenever she tries antidepressants in the past.  Alternative options were discussed to help manage mood instability/irritability such as mood stabilizers.  Patient had reported trying everything besides lithium in the past which she did not want to try today.  She was however open to retrialing Lamictal as she could not remember what the adverse side effects she had experienced with that was.  It was mentioned that her chart shows her having allergy to did not list what the side effects were.  We also reviewed patient's past diagnoses and at various times she been diagnosed with PTSD, MDD, GAD, OCD, and borderline personality disorder.  We reviewed the criteria some of these notably with borderline personality disorder and patient had reported that seemed to fit her well.  She described mood swings, impulsive behaviors, splitting behaviors, irrational anger, and unstable interpersonal relationships.  The treatment for borderline personality disorder was discussed explaining that while medications can be used therapy especially DBT tend to be the most effective avenues.  Patient was interested in this and was provided DBT resources in addition to being referred to [facility].  Past Psychiatric History: Possibly had neuropsych testing in the past around 2023. At which point she believes that the sleep apnea could be affecting her memory instead of something else that the neurologist stated. Saw a therapist and psychiatrist in the  past. Denies any history of SI or thoughts of self harm. Denies any prior psychiatric hospitalizations.   Has tried Xanax, Klonopin, pregablin, Abilify, Risperdal, Zyprexa, BuSpar, Atarax, Lamictal, trileptal, tegretol, Depakote,Topamax, Celexa, Cymbalta,Paxil, nortriptyline, trintellix, venlafaxine, Luvox, Prystiq, Viibryd, Vyvanse. Often with negative effects  Past Medical History:  Past Medical History:  Diagnosis Date   Anxiety    Anxiety disorder    Arthritis    Blepharitis    Chronic low back pain    Chronic pain    Depression    Diabetes mellitus without complication    Dyslipidemia    Family history of blood clots    Fibromyalgia    GERD (gastroesophageal reflux disease)    Headache    hx migraines   History of kidney stones    History of panic attacks    History of renal calculi    IBS (irritable bowel syndrome)    Interstitial cystitis    Lupus    tested positive for antibodies for lupus. dr to do further tests   Menorrhagia    OSA on CPAP    not used cpap for several weeks   Pneumonia    hx   PONV (postoperative nausea and vomiting)    RLS (restless legs syndrome)    Rosacea    Seasonal asthma    SI (sacroiliac) joint dysfunction    SUI (stress urinary incontinence, female)    UTI (lower urinary  tract infection)    hx   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 02/23/2013    Past Surgical History:  Procedure Laterality Date   ACHILLES TENDON SURGERY Right 3/16   ANTERIOR CERVICAL DECOMP/DISCECTOMY FUSION N/A 03/11/2016   Procedure: C5-6, C6-7 Anterior Cervical Discectomy and Fusion, Allograft, Plate;  Surgeon: Eldred Manges, MD;  Location: [facility] OR;  Service: Orthopedics;  Laterality: N/A;   CHOLECYSTECTOMY  2014   COLONOSCOPY WITH PROPOFOL N/A 08/25/2017   Procedure: COLONOSCOPY WITH PROPOFOL;  Surgeon: Scot Jun, MD;  Location: [facility] ENDOSCOPY;  Service: Endoscopy;  Laterality: N/A;   CYSTO WITH HYDRODISTENSION N/A 01/26/2014   Procedure:  CYSTOSCOPY/HYDRODISTENSION;  Surgeon: [name], MD;  Location: [facility];  Service: Urology;  Laterality: N/A;   CYSTO/  URETHRAL DILATION/  HYDRODISTENTION/   INSTILLATION THERAPY  07-16-2010//   12-30-2007//   10-27-2006   ELBOW SURGERY Right    EXERCISE TOLERENCE TEST  10-12-2010   NEGATIVE  ADEQUATE ETT/  NO ISCHEMIA OR EVIDENCE HIGH GRADE OBSTRUCTIVE CAD/  NO FURTHER TEST NEEDED   HYSTEROSCOPY W/  NOVASURE ENDOMETRIAL ABLATION  2014   LAPAROSCOPIC OVARIAN CYST BX  2005   AND  URETEROSCOPIC LASER LITHO  STONE EXTRACTION   SHOULDER OPEN ROTATOR CUFF REPAIR Right 2004   TONSILLECTOMY     TRANSTHORACIC ECHOCARDIOGRAM  06-07-2006   normal study/  ef 60-65%   TUBAL LIGATION Bilateral 1995    Family History:  Family History  Problem Relation Age of Onset   Hypertension Mother    Diabetes Father    Brain cancer Father    Clotting disorder Father        blood clots   Fibromyalgia Sister    Suicidality Sister    Hypertension Other        Cancer, Cerebrovascular disease run on mother side of family    Social History:  Social History   Socioeconomic History   Marital status: Divorced    Spouse name: Not on file   Number of children: 3   Years of education: HS   Highest education level: 12th grade  Occupational History   Occupation: Designer, industrial/product: UNEMPLOYED    Comment: Disability  Tobacco Use   Smoking status: Never   Smokeless tobacco: Never  Vaping Use   Vaping Use: Never used  Substance and Sexual Activity   Alcohol use: No   Drug use: No   Sexual activity: Not on file  Other Topics Concern   Not on file  Social History Narrative   Patient is right-handed. She avoids caffeine. She has recently been using the treadmill.   Social Determinants of Health   Financial Resource Strain: Not on file  Food Insecurity: Not on file  Transportation Needs: Not on file  Physical Activity: Not on file  Stress: Not on file  Social Connections:  Not on file    Allergies:  Allergies  Allergen Reactions   Alendronate Sodium Anaphylaxis    Closes throat up   Doxycycline Other (See Comments)   Gadolinium Derivatives Hives, Nausea Only, Nausea And Vomiting and Rash   Iodinated Contrast Media Hives and Swelling   Milnacipran Hcl Other (See Comments)    Makes patient bleed "from everywhere."   Elavil [Amitriptyline Hcl] Other (See Comments)    Hallucinations    Tramadol Palpitations and Other (See Comments)    Tachycardia    Carbapenems Other (See Comments)    "Felt like death" "Felt like death"  Codeine Sulfate Other (See Comments)   Cymbalta [Duloxetine Hcl] Other (See Comments)    Cannot remember    Duloxetine Other (See Comments)    Facial swelling   Lamotrigine    Morphine Other (See Comments)    Caused anxiety attack pt felt like she was going to have a heart attack Tachycardia per pt   Nsaids Other (See Comments)    Upset stomach Upset stomach   Other Other (See Comments)    bandaid bandaid bandaid bandaid   Ropinirole     Other reaction(s): Other (See Comments) Patient doesn't remember   Skelaxin Other (See Comments)   Soolantra [Ivermectin]     Also Ivermectin   Tessalon [Benzonatate] Other (See Comments)    "FEELS LIKE I'M CHOKING"   Diphenhydramine Hcl Anxiety   Gabapentin Nausea Only    Pt was unable to get out of bed, increased appetite  Pt was unable to get out of bed, increased appetite  Pt was unable to get out of bed, increased appetite    Oxycodone Itching and Rash    SEVERE ITCHING   Oxycodone-Acetaminophen Palpitations   Tetracycline Rash    Current Medications: Current Outpatient Medications  Medication Sig Dispense Refill   lamoTRIgine (LAMICTAL) 25 MG tablet Take 1 tablet (25 mg total) by mouth daily. 30 tablet 2   ACCU-CHEK GUIDE test strip USE 1 (ONE) EACH DAILY FOR GLUCOSE MONITORING     Accu-Chek Softclix Lancets lancets SMARTSIG:1 stick Topical Daily     albuterol  (PROVENTIL) (2.5 MG/3ML) 0.083% nebulizer solution USE 1 VIAL IN NEBULIZER EVERY 6 HOURS 120 mL 11   albuterol (VENTOLIN HFA) 108 (90 Base) MCG/ACT inhaler INHALE 2 PUFFS BY MOUTH EVERY 4 TO 6 HOURS AS NEEDED 8.5 each 6   ammonium lactate (LAC-HYDRIN) 12 % lotion APPLY 1 APPLICATION TOPICALLY AS NEEDED FOR DRY SKIN. 400 mL 0   b complex vitamins tablet Take 1 tablet by mouth daily.     calcitonin, salmon, (MIACALCIN/FORTICAL) 200 UNIT/ACT nasal spray Place 1 spray into alternate nostrils daily.      chlorhexidine (PERIDEX) 0.12 % solution SMARTSIG:By Mouth     Cholecalciferol (VITAMIN D) 50 MCG (2000 UT) CAPS Take 1 capsule by mouth once a week.     clotrimazole-betamethasone (LOTRISONE) cream Apply 1 Application topically 2 (two) times daily as needed.     DEXILANT 60 MG capsule Take 1 capsule by mouth daily as needed.     dicyclomine (BENTYL) 10 MG capsule Take 10 mg by mouth 2 (two) times daily as needed.     EPINEPHrine (EPIPEN 2-PAK) 0.3 mg/0.3 mL IJ SOAJ injection Use as directed 1 each 1   fexofenadine (ALLEGRA) 180 MG tablet Take 180 mg by mouth daily.     fluticasone (FLOVENT HFA) 110 MCG/ACT inhaler TAKE 1 PUFF BY MOUTH TWICE A DAY 12 each 5   HYDROcodone-acetaminophen (NORCO) 10-325 MG tablet Take 1 tablet by mouth every 8 (eight) hours as needed for pain. 90 tablet 0   hydrocortisone valerate cream (WESTCORT) 0.2 % Apply topically 2 (two) times daily.     ketoconazole (NIZORAL) 2 % shampoo Apply 1 application topically 2 (two) times a week.      KRILL OIL PO Take by mouth.     lidocaine (LIDODERM) 5 % Place 1 patch onto the skin daily. Remove & Discard patch within 12 hours or as directed by MD 90 patch 3   magnesium oxide (MAG-OX) 400 MG tablet Take by mouth.  meclizine (ANTIVERT) 25 MG tablet Take by mouth.     ondansetron (ZOFRAN-ODT) 8 MG disintegrating tablet Take 8 mg by mouth 3 (three) times daily.     PRESCRIPTION MEDICATION every 14 (fourteen) days. 2 ALLERGY INJECTIONS  EVERY 2 WKS     rizatriptan (MAXALT-MLT) 10 MG disintegrating tablet      SPIRULINA PO Take 1 capsule by mouth daily.     tiZANidine (ZANAFLEX) 2 MG tablet Take 1 tablet (2 mg total) by mouth every 8 (eight) hours as needed. 50 tablet 2   tiZANidine (ZANAFLEX) 4 MG capsule Take by mouth.     valACYclovir (VALTREX) 1000 MG tablet Take by mouth.     No current facility-administered medications for this visit.     Musculoskeletal: Strength & Muscle Tone: within normal limits Gait & Station: normal Patient leans: N/A  Psychiatric Specialty Exam: Review of Systems  There were no vitals taken for this visit.There is no height or weight on file to calculate BMI.  General Appearance: Fairly Groomed  Eye Contact:  Minimal  Speech:  Normal Rate  Volume:  Normal  Mood:  Anxious and Depressed  Affect:  Congruent  Thought Process:  Coherent  Orientation:  Full (Time, Place, and Person)  Thought Content: Logical, Illogical, and Rumination   Suicidal Thoughts:  No  Homicidal Thoughts:  No  Memory:  Immediate;   Fair  Judgement:  Fair  Insight:  Fair  Psychomotor Activity:  Decreased  Concentration:  Concentration: Good  Recall:  Fair  Fund of Knowledge: Fair  Language: Good  Akathisia:  NA    AIMS (if indicated): not done  Assets:  Community education officer  ADL's:  Intact  Cognition: WNL  Sleep:  Fair   Metabolic Disorder Labs: No results found for: "HGBA1C", "MPG" No results found for: "PROLACTIN" No results found for: "CHOL", "TRIG", "HDL", "CHOLHDL", "VLDL", "LDLCALC" Lab Results  Component Value Date   TSH 4.940 (H) 07/20/2018    Therapeutic Level Labs: No results found for: "LITHIUM" No results found for: "VALPROATE" No results found for: "CBMZ"   Screenings: GAD-7    Flowsheet Row Office Visit [facility] 10/04/2022 in [facility]  Total GAD-7 Score 14      PHQ2-9    Flowsheet Row Office Visit from  12/27/2022 in [facility] Office Visit from 11/29/2022 in [facility] Office Visit from 10/04/2022 in [facility] Office Visit from 09[facility]27/2022 in [facility] Office Visit from 09/03/2022 in [facility]  PHQ-2 Total Score 0 0 2 0 0  PHQ-9 Total Score -- -- 12 -- --      Flowsheet Row Office Visit from 10/04/2022 in [facility]  C-SSRS RISK CATEGORY No Risk       Collaboration of Care: Collaboration of Care: Medication Management AEB medication prescription, Psychiatrist AEB past record review, and Referral or follow-up with counselor/therapist AEB referral  45 minutes were spent in chart review, interview, psycho education, counseling, medical decision making, coordination of care and long-term prognosis.  Patient was given opportunity to ask question and all concerns and questions were addressed and answers. Excluding separately billable services.   Patient/Guardian was advised Release of Information must be obtained prior to any record release in order to collaborate their care with an outside provider. Patient/Guardian was advised if they have not already done so to contact the registration  department to sign all necessary forms in order for [place] to release information regarding their care.   Consent: Patient/Guardian gives verbal consent for treatment and assignment of benefits for services provided during this visit. Patient/Guardian expressed understanding and agreed to proceed.    Stasia Cavalier, MD 01/09/2023, 10:10 AM

## 2023-01-08 NOTE — Telephone Encounter (Signed)
Patient requesting a new prescription / letter of medical necessity. The original was sent back in Oct 2023 and patient did not contact them. She tried contacting them and was informed that she would need new rx. E-Stim Bracing  Hot/Cold Therapy

## 2023-01-09 ENCOUNTER — Telehealth (HOSPITAL_COMMUNITY): Payer: Self-pay | Admitting: Licensed Clinical Social Worker

## 2023-01-09 ENCOUNTER — Encounter (HOSPITAL_COMMUNITY): Payer: Self-pay | Admitting: Psychiatry

## 2023-01-09 DIAGNOSIS — F609 Personality disorder, unspecified: Secondary | ICD-10-CM | POA: Insufficient documentation

## 2023-01-10 NOTE — Telephone Encounter (Signed)
Pt.notified

## 2023-01-10 NOTE — Telephone Encounter (Signed)
The original that was scanned into chart has been placed in your office.

## 2023-01-21 DIAGNOSIS — M545 Low back pain, unspecified: Secondary | ICD-10-CM | POA: Diagnosis not present

## 2023-01-21 DIAGNOSIS — J019 Acute sinusitis, unspecified: Secondary | ICD-10-CM | POA: Diagnosis not present

## 2023-01-23 NOTE — Progress Notes (Signed)
Subjective:    Patient ID: Karina Greer, female    DOB: [birth year], 57 y.o.   MRN: [id number]  HPI: Karina Greer is a 57 y.o. female who returns for follow up appointment for chronic pain and medication refill. She states her  pain is located in her neck pain, right shoulder, lower back mainly left side.right ankle pain. She also reports bilateral feet pain She rates her pain 7. Her current exercise regime is walking and performing stretching exercises.  [name] Morphine equivalent is 30.00 MME.   Last UDS was Performed on 11/29/2022, it was consistent.      Pain Inventory Average Pain 9 Pain Right Now 7 My pain is constant, burning, dull, stabbing, tingling, and aching  In the last 24 hours, has pain interfered with the following? General activity 6 Relation with others 7 Enjoyment of life 6 What TIME of day is your pain at its worst? morning , daytime, evening, and night Sleep (in general) Fair  Pain is worse with: walking, bending, sitting, inactivity, standing, and some activites Pain improves with: rest, heat/ice, therapy/exercise, pacing activities, medication, TENS, and injections Relief from Meds:  GOOD  Family History  Problem Relation Age of Onset   Hypertension Mother    Diabetes Father    Brain cancer Father    Clotting disorder Father        blood clots   Fibromyalgia Sister    Suicidality Sister    Hypertension Other        Cancer, Cerebrovascular disease run on mother side of family   Social History   Socioeconomic History   Marital status: Divorced    Spouse name: Not on file   Number of children: 3   Years of education: HS   Highest education level: 12th grade  Occupational History   Occupation: Designer, industrial/product: UNEMPLOYED    Comment: Disability  Tobacco Use   Smoking status: Never   Smokeless tobacco: Never  Vaping Use   Vaping Use: Never used  Substance and Sexual Activity   Alcohol use: No   Drug use: No   Sexual activity:  Not on file  Other Topics Concern   Not on file  Social History Narrative   Patient is right-handed. She avoids caffeine. She has recently been using the treadmill.   Social Determinants of Health   Financial Resource Strain: Not on file  Food Insecurity: Not on file  Transportation Needs: Not on file  Physical Activity: Not on file  Stress: Not on file  Social Connections: Not on file   Past Surgical History:  Procedure Laterality Date   ACHILLES TENDON SURGERY Right 3/16   ANTERIOR CERVICAL DECOMP/DISCECTOMY FUSION N/A 03/11/2016   Procedure: C5-6, C6-7 Anterior Cervical Discectomy and Fusion, Allograft, Plate;  Surgeon: Eldred Manges, MD;  Location: [facility] OR;  Service: Orthopedics;  Laterality: N/A;   CHOLECYSTECTOMY  2014   COLONOSCOPY WITH PROPOFOL N/A 08/25/2017   Procedure: COLONOSCOPY WITH PROPOFOL;  Surgeon: Scot Jun, MD;  Location: [facility] ENDOSCOPY;  Service: Endoscopy;  Laterality: N/A;   CYSTO WITH HYDRODISTENSION N/A 01/26/2014   Procedure: CYSTOSCOPY/HYDRODISTENSION;  Surgeon: [name], MD;  [facility]: [facility]  Service: Urology;  Laterality: N/A;   CYSTO/  URETHRAL DILATION/  HYDRODISTENTION/   INSTILLATION THERAPY  07-16-2010//   12-30-2007//   10-27-2006   ELBOW SURGERY Right    EXERCISE TOLERENCE TEST  10-12-2010   NEGATIVE  ADEQUATE ETT/  NO ISCHEMIA  OR EVIDENCE HIGH GRADE OBSTRUCTIVE CAD/  NO FURTHER TEST NEEDED   HYSTEROSCOPY W/  NOVASURE ENDOMETRIAL ABLATION  2014   LAPAROSCOPIC OVARIAN CYST BX  2005   AND  URETEROSCOPIC LASER LITHO  STONE EXTRACTION   SHOULDER OPEN ROTATOR CUFF REPAIR Right 2004   TONSILLECTOMY     TRANSTHORACIC ECHOCARDIOGRAM  06-07-2006   normal study/  ef 60-65%   TUBAL LIGATION Bilateral 1995   Past Surgical History:  Procedure Laterality Date   ACHILLES TENDON SURGERY Right 3/16   ANTERIOR CERVICAL DECOMP/DISCECTOMY FUSION N/A 03/11/2016   Procedure: C5-6, C6-7 Anterior Cervical Discectomy and Fusion,  Allograft, Plate;  Surgeon: Eldred Manges, MD;  Location: [facility] OR;  Service: Orthopedics;  Laterality: N/A;   CHOLECYSTECTOMY  2014   COLONOSCOPY WITH PROPOFOL N/A 08/25/2017   Procedure: COLONOSCOPY WITH PROPOFOL;  Surgeon: Scot Jun, MD;  Location: [facility] ENDOSCOPY;  Service: Endoscopy;  Laterality: N/A;   CYSTO WITH HYDRODISTENSION N/A 01/26/2014   Procedure: CYSTOSCOPY/HYDRODISTENSION;  Surgeon: [name], MD;  Location: [facility];  Service: Urology;  Laterality: N/A;   CYSTO/  URETHRAL DILATION/  HYDRODISTENTION/   INSTILLATION THERAPY  07-16-2010//   12-30-2007//   10-27-2006   ELBOW SURGERY Right    EXERCISE TOLERENCE TEST  10-12-2010   NEGATIVE  ADEQUATE ETT/  NO ISCHEMIA OR EVIDENCE HIGH GRADE OBSTRUCTIVE CAD/  NO FURTHER TEST NEEDED   HYSTEROSCOPY W/  NOVASURE ENDOMETRIAL ABLATION  2014   LAPAROSCOPIC OVARIAN CYST BX  2005   AND  URETEROSCOPIC LASER LITHO  STONE EXTRACTION   SHOULDER OPEN ROTATOR CUFF REPAIR Right 2004   TONSILLECTOMY     TRANSTHORACIC ECHOCARDIOGRAM  06-07-2006   normal study/  ef 60-65%   TUBAL LIGATION Bilateral 1995   Past Medical History:  Diagnosis Date   Anxiety    Anxiety disorder    Arthritis    Blepharitis    Chronic low back pain    Chronic pain    Depression    Diabetes mellitus without complication (HCC)    Dyslipidemia    Family history of blood clots    Fibromyalgia    GERD (gastroesophageal reflux disease)    Headache    hx migraines   History of kidney stones    History of panic attacks    History of renal calculi    IBS (irritable bowel syndrome)    Interstitial cystitis    Lupus (HCC)    tested positive for antibodies for lupus. dr to do further tests   Menorrhagia    OSA on CPAP    not used cpap for several weeks   Pneumonia    hx   PONV (postoperative nausea and vomiting)    RLS (restless legs syndrome)    Rosacea    Seasonal asthma    SI (sacroiliac) joint dysfunction    SUI (stress urinary  incontinence, female)    UTI (lower urinary tract infection)    hx   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 02/23/2013   There were no vitals taken for this visit.  Opioid Risk Score:   Fall Risk Score:  `1  Depression screen [facility] 2/9     12/27/2022    8:42 AM 11/29/2022   10:37 AM 10/04/2022    8:29 AM 09/27/2022    9:50 AM 09/03/2022   12:46 PM 07/30/2022   10:21 AM 07/01/2022   10:02 AM  Depression screen PHQ 2/9  Decreased Interest 0 0  0 0  1 0  Down, Depressed, Hopeless 0 0  0 0 1 0  PHQ - 2 Score 0 0  0 0 2 0  Altered sleeping         Tired, decreased energy         Change in appetite         Feeling bad or failure about yourself          Trouble concentrating         Moving slowly or fidgety/restless         Suicidal thoughts         PHQ-9 Score            Information is confidential and restricted. Go to Review Flowsheets to unlock data.    Review of Systems  Musculoskeletal:  Positive for arthralgias, back pain and gait problem.       PAIN ALL OVER THE BODY  All other systems reviewed and are negative.     Objective:   Physical Exam Vitals and nursing note reviewed.  Constitutional:      Appearance: Normal appearance.  Cardiovascular:     Rate and Rhythm: Normal rate and regular rhythm.     Pulses: Normal pulses.     Heart sounds: Normal heart sounds.  Pulmonary:     Effort: Pulmonary effort is normal.     Breath sounds: Normal breath sounds.  Musculoskeletal:     Cervical back: Normal range of motion and neck supple.     Comments: Normal Muscle Bulk and Muscle Testing Reveals:  Upper Extremities: Right: Decreased ROM 90 Degrees and Muscle Strength 5/5 Left Upper Extremity: Full ROM and Muscle Strength  5/5 Left AC Joint Tenderness  Lumbar Paraspinal Tenderness: L-4-L-5 Lower Extremities: Full ROM and Muscle Strength 5/5 Arises from Table  Slowly Narrow Based  Gait     Skin:    General: Skin is warm and dry.  Neurological:     Mental Status: She is  alert and oriented to person, place, and time.  Psychiatric:        Mood and Affect: Mood normal.        Behavior: Behavior normal.         Assessment & Plan:  1. Lumbago/ Lumbar Spondylosis/ Left Lumbar Radiculitis: Continue to Monitor. 05/03/ 2024 Refilled: HYDROcodone 10/325mg  one tablet every 8 hours as needed #90.  We will continue the opioid monitoring program, this consists of regular clinic visits, examinations, urine drug screen, pill counts as well as use of [place] Controlled Substance Reporting system. A 12 month History has been reviewed on the [place] Controlled Substance Reporting System on 01/24/2023. 2. Fibromyalgia. Continue Current exercise Regime. 01/24/2023 3. Anxiety and depression:Psychiatry Following: Counselor Jessica  at [facility] . Continue Counseling at [facility]. 01/24/2023 4. Migraines: On Maxalt. Neurology Following. 01/24/2023 5. OSA : PCP Following. Continue to Monitor. 01/24/2023 6. Obesity: Dr Carlis Abbott Following: Continue  Healthy Diet Regimen and Continue HEP as Tolerated. 01/24/2023 7. Status Post Cervical Spinal Fusion: C5C6- C-6- C-7 Anterior Cervical Discectomy Fusion and Allograft Plate: Dr. Ophelia Charter Following. 01/24/2023 8. Cervicalgia/ Cervical Radiculitis/ S/P Cervical Spinal Fusion:  Continue to Monitor Dr. Ophelia Charter Following. 01/24/2023 9. Muscle Spasm: Continue Tizanidine as needed.01/24/2023 10.Hereditary and Idiopathic Peripheral Neuropathy Continue with Tens Unit. 01/24/2023. 11.Left Greater Trochanteric Bursitis: . Continue to Alternate Ice and heat Therapy. Continue to Monitor. 01/24/2023. 12. Chronic Bilateral Knee Pain: No Complaints Today. Ortho Following. Continue HEP as Tolerated. Continue to Monitor.  01/24/2023 13. Lateral Epicondylitis of Right Elbow: No complaints today.  Ortho Following. S/P   06/26/2020 Right lateral epicondyle debridement, drilling and repair  By Dr Ophelia Charter. 14. Neuralgia/ Right Facial  Droop: Neurology following. Continue to monitor. 01/24/2023. 15. Polyarthralgia: No complaints today. Continue HEP as tolerated. Continue to monitor. 01/24/2023  16. Paresthesia of Right Lower Extremity: No complaints today. Occasionally: Continue current medication regimen. Continue to monitor. 01/24/2023 17 . Left Calcaneal Spur: X-ray Reviewed. Ms. Troutwine will F/U with her Podiatrist Dr Allena Katz. She verbalizes understanding.    F/U in 1 month .

## 2023-01-24 ENCOUNTER — Other Ambulatory Visit (HOSPITAL_COMMUNITY): Payer: Self-pay

## 2023-01-24 ENCOUNTER — Other Ambulatory Visit: Payer: Self-pay

## 2023-01-24 ENCOUNTER — Encounter: Payer: Self-pay | Admitting: Registered Nurse

## 2023-01-24 ENCOUNTER — Encounter: Payer: Medicare Other | Attending: Physical Medicine & Rehabilitation | Admitting: Registered Nurse

## 2023-01-24 VITALS — BP 131/85 | HR 63 | Ht 63.0 in | Wt 219.0 lb

## 2023-01-24 DIAGNOSIS — Z5181 Encounter for therapeutic drug level monitoring: Secondary | ICD-10-CM

## 2023-01-24 DIAGNOSIS — M79672 Pain in left foot: Secondary | ICD-10-CM | POA: Diagnosis not present

## 2023-01-24 DIAGNOSIS — G894 Chronic pain syndrome: Secondary | ICD-10-CM

## 2023-01-24 DIAGNOSIS — M47816 Spondylosis without myelopathy or radiculopathy, lumbar region: Secondary | ICD-10-CM | POA: Diagnosis not present

## 2023-01-24 DIAGNOSIS — M797 Fibromyalgia: Secondary | ICD-10-CM

## 2023-01-24 DIAGNOSIS — Z79899 Other long term (current) drug therapy: Secondary | ICD-10-CM

## 2023-01-24 DIAGNOSIS — M542 Cervicalgia: Secondary | ICD-10-CM | POA: Diagnosis not present

## 2023-01-24 DIAGNOSIS — M25511 Pain in right shoulder: Secondary | ICD-10-CM | POA: Diagnosis not present

## 2023-01-24 DIAGNOSIS — M79671 Pain in right foot: Secondary | ICD-10-CM | POA: Diagnosis not present

## 2023-01-24 DIAGNOSIS — G8929 Other chronic pain: Secondary | ICD-10-CM | POA: Diagnosis not present

## 2023-01-24 MED ORDER — HYDROCODONE-ACETAMINOPHEN 10-325 MG PO TABS
1.0000 | ORAL_TABLET | Freq: Three times a day (TID) | ORAL | 0 refills | Status: DC | PRN
Start: 2023-01-24 — End: 2023-02-24
  Filled 2023-01-24: qty 90, 30d supply, fill #0

## 2023-01-30 ENCOUNTER — Telehealth: Payer: Self-pay

## 2023-01-30 NOTE — Telephone Encounter (Signed)
Karina Greer wanted to know if you will see her on July 5H? Her mother has an appointment with Dr. Berline Chough on that day.

## 2023-01-31 ENCOUNTER — Encounter: Payer: Self-pay | Admitting: Podiatry

## 2023-01-31 ENCOUNTER — Ambulatory Visit (INDEPENDENT_AMBULATORY_CARE_PROVIDER_SITE_OTHER): Payer: Medicare Other | Admitting: Podiatry

## 2023-01-31 DIAGNOSIS — M7662 Achilles tendinitis, left leg: Secondary | ICD-10-CM

## 2023-01-31 DIAGNOSIS — M7661 Achilles tendinitis, right leg: Secondary | ICD-10-CM | POA: Diagnosis not present

## 2023-01-31 MED ORDER — TRIAMCINOLONE ACETONIDE 10 MG/ML IJ SUSP
10.0000 mg | Freq: Once | INTRAMUSCULAR | Status: AC
Start: 2023-01-31 — End: 2023-01-31
  Administered 2023-01-31: 10 mg

## 2023-01-31 NOTE — Patient Instructions (Signed)

## 2023-02-02 NOTE — Progress Notes (Signed)
Subjective:   Patient ID: Karina Greer, female   DOB: 57 y.o.   MRN: [id number]   HPI Patient states the left has improved with immobilization medication but now there is pain on the right 1   ROS      Objective:  Physical Exam  Neurovascular status intact with patient's left posterior heel found to be improved mild discomfort still noted but overall good with the pain in the right present in the lateral portion of the heel at the insertion.  Patient is found to have no other pathology currently      Assessment:  Improved Achilles tendinitis left with inflammation of the right Achilles at the insertion to the calcaneus     Plan:  Patient.  Reviewed condition discussed injection right explained risk did sterile prep and injected the posterior heel lateral side 3 mg dexamethasone Kenalog 5 mg Xylocaine advised on ice and stretch and boot usage and reappoint as symptoms indicate

## 2023-02-19 ENCOUNTER — Telehealth: Payer: Self-pay | Admitting: Internal Medicine

## 2023-02-19 ENCOUNTER — Ambulatory Visit (HOSPITAL_COMMUNITY): Payer: Medicaid Other | Admitting: Psychiatry

## 2023-02-19 NOTE — Telephone Encounter (Signed)
Lvm and sent message to move 03/03/23 appointment to other provider-Toni

## 2023-02-24 ENCOUNTER — Other Ambulatory Visit: Payer: Self-pay

## 2023-02-24 ENCOUNTER — Ambulatory Visit (INDEPENDENT_AMBULATORY_CARE_PROVIDER_SITE_OTHER): Payer: Medicare Other | Admitting: Podiatry

## 2023-02-24 ENCOUNTER — Encounter: Payer: Medicare Other | Attending: Physical Medicine & Rehabilitation | Admitting: Registered Nurse

## 2023-02-24 ENCOUNTER — Encounter: Payer: Self-pay | Admitting: Podiatry

## 2023-02-24 ENCOUNTER — Encounter: Payer: Self-pay | Admitting: Registered Nurse

## 2023-02-24 VITALS — BP 135/82 | HR 70 | Ht 63.0 in | Wt 223.0 lb

## 2023-02-24 DIAGNOSIS — M7061 Trochanteric bursitis, right hip: Secondary | ICD-10-CM | POA: Insufficient documentation

## 2023-02-24 DIAGNOSIS — M47816 Spondylosis without myelopathy or radiculopathy, lumbar region: Secondary | ICD-10-CM | POA: Diagnosis not present

## 2023-02-24 DIAGNOSIS — Z79899 Other long term (current) drug therapy: Secondary | ICD-10-CM | POA: Diagnosis not present

## 2023-02-24 DIAGNOSIS — G8929 Other chronic pain: Secondary | ICD-10-CM | POA: Insufficient documentation

## 2023-02-24 DIAGNOSIS — M797 Fibromyalgia: Secondary | ICD-10-CM | POA: Insufficient documentation

## 2023-02-24 DIAGNOSIS — M5416 Radiculopathy, lumbar region: Secondary | ICD-10-CM | POA: Diagnosis not present

## 2023-02-24 DIAGNOSIS — M722 Plantar fascial fibromatosis: Secondary | ICD-10-CM | POA: Diagnosis not present

## 2023-02-24 DIAGNOSIS — M25571 Pain in right ankle and joints of right foot: Secondary | ICD-10-CM | POA: Insufficient documentation

## 2023-02-24 DIAGNOSIS — M79671 Pain in right foot: Secondary | ICD-10-CM | POA: Insufficient documentation

## 2023-02-24 DIAGNOSIS — Z5181 Encounter for therapeutic drug level monitoring: Secondary | ICD-10-CM | POA: Insufficient documentation

## 2023-02-24 DIAGNOSIS — G894 Chronic pain syndrome: Secondary | ICD-10-CM | POA: Diagnosis not present

## 2023-02-24 MED ORDER — HYDROCODONE-ACETAMINOPHEN 10-325 MG PO TABS
1.0000 | ORAL_TABLET | Freq: Three times a day (TID) | ORAL | 0 refills | Status: DC | PRN
  Filled 2023-02-24: qty 90, 30d supply, fill #0

## 2023-02-24 MED ORDER — TRIAMCINOLONE ACETONIDE 10 MG/ML IJ SUSP
10.0000 mg | Freq: Once | INTRAMUSCULAR | Status: AC
Start: 2023-02-24 — End: 2023-02-24
  Administered 2023-02-24: 10 mg

## 2023-02-24 NOTE — Progress Notes (Signed)
Subjective:   Patient ID: Karina Greer, female   DOB: 57 y.o.   MRN: [id number]   HPI Patient states the right heel is still hurting her and it hurts most when she steps down on it.  Also states the back hurts some but it seems the bottom has gotten much worse   ROS      Objective:  Physical Exam  Neurovascular status intact posterior heel pain moderately improved discomfort of an exquisite nature plantar heel region right     Assessment:  Acute Planter fasciitis right Achilles tendinitis still present     Plan:  H&P reviewed sterile prep injected the plantar fascia right 3 mg Kenalog 5 mg Xylocaine and if the back continues to hurt we will have to consider further treatment for that.  Patient to be seen back as needed

## 2023-02-24 NOTE — Progress Notes (Signed)
Subjective:    Patient ID: Karina Greer, female    DOB: [birth year], 57 y.o.   MRN: [id number]  HPI: Karina Greer is a 57 y.o. female who returns for follow up appointment for chronic pain and medication refill. She states her pain is located in her . Lower back radiating into her right hip, she also reports at time she states " it feels as though the pain is radiating from her right ankle to right knee and right hip pain. Podiatry is following her right ankle and right foot pain. She had a steroid injection on 01/03/2023, Dr Charlsie Merles. Podiatry note was reviewed.   She rates her pain 8. Her current exercise regime is walking and performing stretching exercises.  Karina Greer Morphine equivalent is 30.00 MME.   Last UDS was Performed on 11/29/2022, it was consistent.    Pain Inventory Average Pain 8 Pain Right Now 8 My pain is constant, sharp, stabbing, tingling, and aching  In the last 24 hours, has pain interfered with the following? General activity 9 Relation with others 9 Enjoyment of life 9 What TIME of day is your pain at its worst? daytime, evening, and night Sleep (in general) Fair  Pain is worse with: walking, bending, sitting, inactivity, standing, and some activites Pain improves with: rest, heat/ice, therapy/exercise, pacing activities, medication, TENS, and injections Relief from Meds: 8  Family History  Problem Relation Age of Onset   Hypertension Mother    Diabetes Father    Brain cancer Father    Clotting disorder Father        blood clots   Fibromyalgia Sister    Suicidality Sister    Hypertension Other        Cancer, Cerebrovascular disease run on mother side of family   Social History   Socioeconomic History   Marital status: Divorced    Spouse name: Not on file   Number of children: 3   Years of education: HS   Highest education level: 12th grade  Occupational History   Occupation: Designer, industrial/product: UNEMPLOYED    Comment: Disability   Tobacco Use   Smoking status: Never   Smokeless tobacco: Never  Vaping Use   Vaping Use: Never used  Substance and Sexual Activity   Alcohol use: No   Drug use: No   Sexual activity: Not on file  Other Topics Concern   Not on file  Social History Narrative   Patient is right-handed. She avoids caffeine. She has recently been using the treadmill.   Social Determinants of Health   Financial Resource Strain: Not on file  Food Insecurity: Not on file  Transportation Needs: Not on file  Physical Activity: Not on file  Stress: Not on file  Social Connections: Not on file   Past Surgical History:  Procedure Laterality Date   ACHILLES TENDON SURGERY Right 3/16   ANTERIOR CERVICAL DECOMP/DISCECTOMY FUSION N/A 03/11/2016   Procedure: C5-6, C6-7 Anterior Cervical Discectomy and Fusion, Allograft, Plate;  Surgeon: Eldred Manges, MD;  Location: [facility] OR;  Service: Orthopedics;  Laterality: N/A;   CHOLECYSTECTOMY  2014   COLONOSCOPY WITH PROPOFOL N/A 08/25/2017   Procedure: COLONOSCOPY WITH PROPOFOL;  Surgeon: Scot Jun, MD;  Location: [facility] ENDOSCOPY;  Service: Endoscopy;  Laterality: N/A;   CYSTO WITH HYDRODISTENSION N/A 01/26/2014   Procedure: CYSTOSCOPY/HYDRODISTENSION;  Surgeon: [name], MD;  Location: [facility];  Service: Urology;  Laterality: N/A;   CYSTO/  URETHRAL DILATION/  HYDRODISTENTION/   INSTILLATION THERAPY  07-16-2010//   12-30-2007//   10-27-2006   ELBOW SURGERY Right    EXERCISE TOLERENCE TEST  10-12-2010   NEGATIVE  ADEQUATE ETT/  NO ISCHEMIA OR EVIDENCE HIGH GRADE OBSTRUCTIVE CAD/  NO FURTHER TEST NEEDED   HYSTEROSCOPY W/  NOVASURE ENDOMETRIAL ABLATION  2014   LAPAROSCOPIC OVARIAN CYST BX  2005   AND  URETEROSCOPIC LASER LITHO  STONE EXTRACTION   SHOULDER OPEN ROTATOR CUFF REPAIR Right 2004   TONSILLECTOMY     TRANSTHORACIC ECHOCARDIOGRAM  06-07-2006   normal study/  ef 60-65%   TUBAL LIGATION Bilateral 1995   Past Surgical History:   Procedure Laterality Date   ACHILLES TENDON SURGERY Right 3/16   ANTERIOR CERVICAL DECOMP/DISCECTOMY FUSION N/A 03/11/2016   Procedure: C5-6, C6-7 Anterior Cervical Discectomy and Fusion, Allograft, Plate;  Surgeon: Eldred Manges, MD;  Location: [facility] OR;  Service: Orthopedics;  Laterality: N/A;   CHOLECYSTECTOMY  2014   COLONOSCOPY WITH PROPOFOL N/A 08/25/2017   Procedure: COLONOSCOPY WITH PROPOFOL;  Surgeon: Scot Jun, MD;  Location: [facility] ENDOSCOPY;  Service: Endoscopy;  Laterality: N/A;   CYSTO WITH HYDRODISTENSION N/A 01/26/2014   Procedure: CYSTOSCOPY/HYDRODISTENSION;  Surgeon: [name], MD;  Location: [facility];  Service: Urology;  Laterality: N/A;   CYSTO/  URETHRAL DILATION/  HYDRODISTENTION/   INSTILLATION THERAPY  07-16-2010//   12-30-2007//   10-27-2006   ELBOW SURGERY Right    EXERCISE TOLERENCE TEST  10-12-2010   NEGATIVE  ADEQUATE ETT/  NO ISCHEMIA OR EVIDENCE HIGH GRADE OBSTRUCTIVE CAD/  NO FURTHER TEST NEEDED   HYSTEROSCOPY W/  NOVASURE ENDOMETRIAL ABLATION  2014   LAPAROSCOPIC OVARIAN CYST BX  2005   AND  URETEROSCOPIC LASER LITHO  STONE EXTRACTION   SHOULDER OPEN ROTATOR CUFF REPAIR Right 2004   TONSILLECTOMY     TRANSTHORACIC ECHOCARDIOGRAM  06-07-2006   normal study/  ef 60-65%   TUBAL LIGATION Bilateral 1995   Past Medical History:  Diagnosis Date   Anxiety    Anxiety disorder    Arthritis    Blepharitis    Chronic low back pain    Chronic pain    Depression    Diabetes mellitus without complication (HCC)    Dyslipidemia    Family history of blood clots    Fibromyalgia    GERD (gastroesophageal reflux disease)    Headache    hx migraines   History of kidney stones    History of panic attacks    History of renal calculi    IBS (irritable bowel syndrome)    Interstitial cystitis    Lupus (HCC)    tested positive for antibodies for lupus. dr to do further tests   Menorrhagia    OSA on CPAP    not used cpap for several weeks    Pneumonia    hx   PONV (postoperative nausea and vomiting)    RLS (restless legs syndrome)    Rosacea    Seasonal asthma    SI (sacroiliac) joint dysfunction    SUI (stress urinary incontinence, female)    UTI (lower urinary tract infection)    hx   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 02/23/2013   BP 135/82   Pulse 70   Ht 5\' 3"  (1.6 m)   Wt 223 lb (101.2 kg)   SpO2 97%   BMI 39.50 kg/m   Opioid Risk Score:   Fall Risk Score:  `1  Depression screen  PHQ 2/9     02/24/2023    8:38 AM 01/24/2023    9:02 AM 12/27/2022    8:42 AM 11/29/2022   10:37 AM 10/04/2022    8:29 AM 09/27/2022    9:50 AM 09/03/2022   12:46 PM  Depression screen PHQ 2/9  Decreased Interest 0 0 0 0  0 0  Down, Depressed, Hopeless 0 0 0 0  0 0  PHQ - 2 Score 0 0 0 0  0 0  Altered sleeping         Tired, decreased energy         Change in appetite         Feeling bad or failure about yourself          Trouble concentrating         Moving slowly or fidgety/restless         Suicidal thoughts         PHQ-9 Score            Information is confidential and restricted. Go to Review Flowsheets to unlock data.    Review of Systems  Musculoskeletal:  Positive for back pain, myalgias and neck pain.       Pain all over the body  All other systems reviewed and are negative.     Objective:   Physical Exam Vitals and nursing note reviewed.  Constitutional:      Appearance: Normal appearance.  Neck:     Comments: Cervical Paraspinal Tenderness: C-5-C-6  Cardiovascular:     Rate and Rhythm: Normal rate and regular rhythm.     Pulses: Normal pulses.     Heart sounds: Normal heart sounds.  Pulmonary:     Effort: Pulmonary effort is normal.     Breath sounds: Normal breath sounds.  Musculoskeletal:     Cervical back: Normal range of motion and neck supple.     Comments: Normal Muscle Bulk and Muscle Testing Reveals:  Upper Extremities: Full ROM and Muscle Strength 5/5 Bilateral AC Joint  Tenderness Thoracic and Lumbar Hypersensitivity Lower Extremities: Full ROM and Muscle Strength 5/5 Arises from Chair slowly Narrow Based  Gait     Skin:    General: Skin is warm and dry.  Neurological:     Mental Status: She is alert and oriented to person, place, and time.  Psychiatric:        Mood and Affect: Mood normal.        Behavior: Behavior normal.         Assessment & Plan:  1. Lumbago/ Lumbar Spondylosis/ Right  Lumbar Radiculitis: Continue to Monitor. 06/03/ 2024 Refilled: HYDROcodone 10/325mg  one tablet every 8 hours as needed #90.  We will continue the opioid monitoring program, this consists of regular clinic visits, examinations, urine drug screen, pill counts as well as use of [place] Controlled Substance Reporting system. A 12 month History has been reviewed on the [place] Controlled Substance Reporting System on 02/24/2023. 2. Fibromyalgia. Continue Current exercise Regime. 02/24/2023 3. Anxiety and depression:Psychiatry Following: Counselor Jessica  at [facility] . Continue Counseling at [facility]. 02/24/2023 4. Migraines: On Maxalt. Neurology Following. 02/24/2023 5. OSA : PCP Following. Continue to Monitor. 02/24/2023 6. Obesity: Dr Carlis Abbott Following: Continue  Healthy Diet Regimen and Continue HEP as Tolerated. 02/24/2023 7. Status Post Cervical Spinal Fusion: C5C6- C-6- C-7 Anterior Cervical Discectomy Fusion and Allograft Plate: Dr. Ophelia Charter Following. 02/24/2023 8. Cervicalgia/ Cervical Radiculitis/ S/P Cervical Spinal Fusion:  Continue  to Monitor Dr. Ophelia Charter Following. 02/24/2023 9. Muscle Spasm: Continue Tizanidine as needed.02/24/2023 10.Hereditary and Idiopathic Peripheral Neuropathy Continue with Tens Unit. 02/24/2023. 11.Left Greater Trochanteric Bursitis: . Continue to Alternate Ice and heat Therapy. Continue to Monitor. 02/24/2023. 12. Chronic Bilateral Knee Pain: No Complaints Today. Ortho Following. Continue HEP as  Tolerated. Continue to Monitor. 02/24/2023 13. Lateral Epicondylitis of Right Elbow: No complaints today.  Ortho Following. S/P   06/26/2020 Right lateral epicondyle debridement, drilling and repair  By Dr Ophelia Charter. 14. Neuralgia/ Right Facial Droop: Neurology following. Continue to monitor. 02/24/2023. 15. Polyarthralgia: No complaints today. Continue HEP as tolerated. Continue to monitor. 02/24/2023  16. Paresthesia of Right Lower Extremity: No complaints today. Occasionally: Continue current medication regimen. Continue to monitor. 02/24/2023 17 . Right Foot Pain/ Chronic Right Ankle Pain:  S/P Cortisone injection last month by her Podiatrist Dr Regal.We will continue to Monitor.  .    F/U in 1 month .

## 2023-02-28 ENCOUNTER — Ambulatory Visit: Payer: Medicare Other | Admitting: Podiatry

## 2023-03-03 ENCOUNTER — Ambulatory Visit: Payer: Medicare Other | Admitting: Internal Medicine

## 2023-03-07 ENCOUNTER — Ambulatory Visit: Payer: Medicare Other | Admitting: Physician Assistant

## 2023-03-08 ENCOUNTER — Other Ambulatory Visit: Payer: Self-pay | Admitting: Podiatry

## 2023-03-13 ENCOUNTER — Ambulatory Visit (INDEPENDENT_AMBULATORY_CARE_PROVIDER_SITE_OTHER): Payer: Medicare Other | Admitting: Podiatry

## 2023-03-13 ENCOUNTER — Encounter: Payer: Self-pay | Admitting: Podiatry

## 2023-03-13 VITALS — BP 151/85 | HR 64

## 2023-03-13 DIAGNOSIS — M7661 Achilles tendinitis, right leg: Secondary | ICD-10-CM | POA: Diagnosis not present

## 2023-03-13 DIAGNOSIS — M722 Plantar fascial fibromatosis: Secondary | ICD-10-CM

## 2023-03-13 DIAGNOSIS — M7662 Achilles tendinitis, left leg: Secondary | ICD-10-CM

## 2023-03-13 MED ORDER — GABAPENTIN 300 MG PO CAPS
300.0000 mg | ORAL_CAPSULE | Freq: Three times a day (TID) | ORAL | 3 refills | Status: DC
Start: 2023-03-13 — End: 2023-05-14

## 2023-03-13 NOTE — Progress Notes (Signed)
Subjective:   Patient ID: Karina Greer, female   DOB: 57 y.o.   MRN: [id number]   HPI Patient states the bottom is feeling good but the back is really bothering me on the outside and I just cannot wear that big boot comfortably on this foot.  Left 1 feels good   ROS      Objective:  Physical Exam  Neurovascular status intact with inflammation which is 1 small area on the posterior lateral aspect of the right heel with a plantar heel doing much better with quite a bit of inflammation reduction     Assessment:  Inflammatory Achilles tendinitis right lateral side     Plan:  Reviewed at great length we are going to try 1 more time using medication I did explain risk in great detail chances for rupture if it ruptures or require repair.  I am hoping we can avoid surgery on this patient with medication and I did carefully inject the lateral side staying away from the central medial portion of the tendon with 3 mg dexamethasone Kenalog 5 mg Xylocaine tolerated well.  I dispensed night splint with all instructions given properly fitted to her right lower leg a prefab static device to stretch the Achilles and the plantar fascia at the same time

## 2023-03-14 DIAGNOSIS — N3010 Interstitial cystitis (chronic) without hematuria: Secondary | ICD-10-CM | POA: Diagnosis not present

## 2023-03-14 DIAGNOSIS — R309 Painful micturition, unspecified: Secondary | ICD-10-CM | POA: Diagnosis not present

## 2023-03-21 ENCOUNTER — Ambulatory Visit: Payer: Medicare Other | Admitting: Physician Assistant

## 2023-03-23 ENCOUNTER — Other Ambulatory Visit: Payer: Self-pay

## 2023-03-23 ENCOUNTER — Other Ambulatory Visit: Payer: Self-pay | Admitting: Registered Nurse

## 2023-03-23 DIAGNOSIS — M797 Fibromyalgia: Secondary | ICD-10-CM

## 2023-03-24 ENCOUNTER — Ambulatory Visit: Payer: Medicare Other | Admitting: Registered Nurse

## 2023-03-24 ENCOUNTER — Other Ambulatory Visit: Payer: Self-pay

## 2023-03-24 MED FILL — Hydrocodone-Acetaminophen Tab 10-325 MG: ORAL | 30 days supply | Qty: 90 | Fill #0 | Status: AC

## 2023-03-24 NOTE — Telephone Encounter (Signed)
PMP was Reviewed.  Hydrocodone e-scribed today.  Call placed to Karina Greer regarding the above, she verbalizes understanding.

## 2023-03-25 ENCOUNTER — Other Ambulatory Visit: Payer: Self-pay

## 2023-03-28 ENCOUNTER — Other Ambulatory Visit: Payer: Self-pay

## 2023-03-28 ENCOUNTER — Ambulatory Visit
Admission: RE | Admit: 2023-03-28 | Discharge: 2023-03-28 | Disposition: A | Discharge status: Home / Self Care | Payer: Medicare Other | Source: Ambulatory Visit | Attending: Registered Nurse | Admitting: Registered Nurse

## 2023-03-28 ENCOUNTER — Encounter: Payer: Self-pay | Admitting: Registered Nurse

## 2023-03-28 ENCOUNTER — Encounter: Payer: Medicare Other | Attending: Physical Medicine & Rehabilitation | Admitting: Registered Nurse

## 2023-03-28 VITALS — BP 116/81 | HR 60 | Ht 63.0 in | Wt 226.0 lb

## 2023-03-28 DIAGNOSIS — M47816 Spondylosis without myelopathy or radiculopathy, lumbar region: Secondary | ICD-10-CM | POA: Insufficient documentation

## 2023-03-28 DIAGNOSIS — M797 Fibromyalgia: Secondary | ICD-10-CM | POA: Insufficient documentation

## 2023-03-28 DIAGNOSIS — M7062 Trochanteric bursitis, left hip: Secondary | ICD-10-CM | POA: Diagnosis not present

## 2023-03-28 DIAGNOSIS — M545 Low back pain, unspecified: Secondary | ICD-10-CM | POA: Insufficient documentation

## 2023-03-28 DIAGNOSIS — M79671 Pain in right foot: Secondary | ICD-10-CM | POA: Diagnosis not present

## 2023-03-28 DIAGNOSIS — M7061 Trochanteric bursitis, right hip: Secondary | ICD-10-CM | POA: Diagnosis not present

## 2023-03-28 DIAGNOSIS — G8929 Other chronic pain: Secondary | ICD-10-CM | POA: Diagnosis not present

## 2023-03-28 DIAGNOSIS — M25551 Pain in right hip: Secondary | ICD-10-CM | POA: Diagnosis not present

## 2023-03-28 DIAGNOSIS — M5416 Radiculopathy, lumbar region: Secondary | ICD-10-CM | POA: Diagnosis not present

## 2023-03-28 MED ORDER — HYDROCODONE-ACETAMINOPHEN 10-325 MG PO TABS
1.0000 | ORAL_TABLET | Freq: Three times a day (TID) | ORAL | 0 refills | Status: DC | PRN
Start: 2023-03-28 — End: 2023-05-14
  Filled 2023-03-28 – 2023-04-23 (×3): qty 90, 30d supply, fill #0

## 2023-03-28 NOTE — Progress Notes (Unsigned)
Subjective:    Patient ID: Karina Greer, female    DOB: 1966-05-19, 57 y.o.   MRN: [id number]  HPI: Karina Greer is a 57 y.o. female who returns for follow up appointment for chronic pain and medication refill. states *** pain is located in  ***. rates pain ***. current exercise regime is walking and performing stretching exercises.  Ms. Rames Morphine equivalent is *** MME.   Last UDS was Performed  on 11/29/2022, it was consistent.     Pain Inventory Average Pain 8 Pain Right Now 8 My pain is constant, sharp, burning, dull, stabbing, tingling, and aching  In the last 24 hours, has pain interfered with the following? General activity 8 Relation with others 8 Enjoyment of life 8 What TIME of day is your pain at its worst? evening and night Sleep (in general) Fair  Pain is worse with: walking, bending, sitting, inactivity, standing, unsure, and some activites Pain improves with: rest, heat/ice, therapy/exercise, pacing activities, medication, TENS, and injections Relief from Meds: 8  Family History  Problem Relation Age of Onset   Hypertension Mother    Diabetes Father    Brain cancer Father    Clotting disorder Father        blood clots   Fibromyalgia Sister    Suicidality Sister    Hypertension Other        Cancer, Cerebrovascular disease run on mother side of family   Social History   Socioeconomic History   Marital status: Divorced    Spouse name: Not on file   Number of children: 3   Years of education: HS   Highest education level: 12th grade  Occupational History   Occupation: Designer, industrial/product: UNEMPLOYED    Comment: Disability  Tobacco Use   Smoking status: Never   Smokeless tobacco: Never  Vaping Use   Vaping Use: Never used  Substance and Sexual Activity   Alcohol use: No   Drug use: No   Sexual activity: Not on file  Other Topics Concern   Not on file  Social History Narrative   Patient is right-handed. She avoids caffeine. She has  recently been using the treadmill.   Social Determinants of Health   Financial Resource Strain: Not on file  Food Insecurity: Not on file  Transportation Needs: Not on file  Physical Activity: Not on file  Stress: Not on file  Social Connections: Not on file   Past Surgical History:  Procedure Laterality Date   ACHILLES TENDON SURGERY Right 3/16   ANTERIOR CERVICAL DECOMP/DISCECTOMY FUSION N/A 03/11/2016   Procedure: C5-6, C6-7 Anterior Cervical Discectomy and Fusion, Allograft, Plate;  Surgeon: Eldred Manges, MD;  Location: [facility];  Service: Orthopedics;  Laterality: N/A;   CHOLECYSTECTOMY  2014   COLONOSCOPY WITH PROPOFOL N/A 08/25/2017   Procedure: COLONOSCOPY WITH PROPOFOL;  Surgeon: Scot Jun, MD;  Location: [facility] ENDOSCOPY;  Service: Endoscopy;  Laterality: N/A;   CYSTO WITH HYDRODISTENSION N/A 01/26/2014   Procedure: CYSTOSCOPY/HYDRODISTENSION;  Surgeon: [name], MD;  Location: [facility];  Service: Urology;  Laterality: N/A;   CYSTO/  URETHRAL DILATION/  HYDRODISTENTION/   INSTILLATION THERAPY  07-16-2010//   12-30-2007//   10-27-2006   ELBOW SURGERY Right    EXERCISE TOLERENCE TEST  10-12-2010   NEGATIVE  ADEQUATE ETT/  NO ISCHEMIA OR EVIDENCE HIGH GRADE OBSTRUCTIVE CAD/  NO FURTHER TEST NEEDED   HYSTEROSCOPY W/  NOVASURE ENDOMETRIAL ABLATION  2014  LAPAROSCOPIC OVARIAN CYST BX  2005   AND  URETEROSCOPIC LASER LITHO  STONE EXTRACTION   SHOULDER OPEN ROTATOR CUFF REPAIR Right 2004   TONSILLECTOMY     TRANSTHORACIC ECHOCARDIOGRAM  06-07-2006   normal study/  ef 60-65%   TUBAL LIGATION Bilateral 1995   Past Surgical History:  Procedure Laterality Date   ACHILLES TENDON SURGERY Right 3/16   ANTERIOR CERVICAL DECOMP/DISCECTOMY FUSION N/A 03/11/2016   Procedure: C5-6, C6-7 Anterior Cervical Discectomy and Fusion, Allograft, Plate;  Surgeon: Eldred Manges, MD;  Location: [facility];  Service: Orthopedics;  Laterality: N/A;   CHOLECYSTECTOMY  2014    COLONOSCOPY WITH PROPOFOL N/A 08/25/2017   Procedure: COLONOSCOPY WITH PROPOFOL;  Surgeon: Scot Jun, MD;  Location: [facility] ENDOSCOPY;  Service: Endoscopy;  Laterality: N/A;   CYSTO WITH HYDRODISTENSION N/A 01/26/2014   Procedure: CYSTOSCOPY/HYDRODISTENSION;  Surgeon: [name], MD;  Location: [facility];  Service: Urology;  Laterality: N/A;   CYSTO/  URETHRAL DILATION/  HYDRODISTENTION/   INSTILLATION THERAPY  07-16-2010//   12-30-2007//   10-27-2006   ELBOW SURGERY Right    EXERCISE TOLERENCE TEST  10-12-2010   NEGATIVE  ADEQUATE ETT/  NO ISCHEMIA OR EVIDENCE HIGH GRADE OBSTRUCTIVE CAD/  NO FURTHER TEST NEEDED   HYSTEROSCOPY W/  NOVASURE ENDOMETRIAL ABLATION  2014   LAPAROSCOPIC OVARIAN CYST BX  2005   AND  URETEROSCOPIC LASER LITHO  STONE EXTRACTION   SHOULDER OPEN ROTATOR CUFF REPAIR Right 2004   TONSILLECTOMY     TRANSTHORACIC ECHOCARDIOGRAM  06-07-2006   normal study/  ef 60-65%   TUBAL LIGATION Bilateral 1995   Past Medical History:  Diagnosis Date   Anxiety    Anxiety disorder    Arthritis    Blepharitis    Chronic low back pain    Chronic pain    Depression    Diabetes mellitus without complication (HCC)    Dyslipidemia    Family history of blood clots    Fibromyalgia    GERD (gastroesophageal reflux disease)    Headache    hx migraines   History of kidney stones    History of panic attacks    History of renal calculi    IBS (irritable bowel syndrome)    Interstitial cystitis    Lupus (HCC)    tested positive for antibodies for lupus. 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   There  were no vitals taken for this visit.  Opioid Risk Score:   Fall Risk Score:  `1  Depression screen [facility] 2/9     02/24/2023    8:38 AM 01/24/2023    9:02 AM 12/27/2022    8:42 AM 11/29/2022   10:37 AM 10/04/2022    8:29 AM 09/27/2022    9:50 AM 09/03/2022   12:46 PM  Depression screen PHQ 2/9  Decreased Interest 0 0 0 0  0 0  Down, Depressed, Hopeless 0 0 0 0  0 0  PHQ - 2 Score 0 0 0  0  0 0  Altered sleeping         Tired, decreased energy         Change in appetite         Feeling bad or failure about yourself          Trouble concentrating         Moving slowly or fidgety/restless         Suicidal thoughts         PHQ-9 Score            Information is confidential and restricted. Go to Review Flowsheets to unlock data.    Review of Systems  Musculoskeletal:  Positive for arthralgias, back pain and gait problem.       Pain all over the body, right arms, knees, right foot  All other systems reviewed and are negative.     Objective:   Physical Exam        Assessment & Plan:  1. Lumbago/ Lumbar Spondylosis/ Right  Lumbar Radiculitis: Continue to Monitor. 06/03/ 2024 Refilled: HYDROcodone 10/325mg  one tablet every 8 hours as needed #90.  We will continue the opioid monitoring program, this consists of regular clinic visits, examinations, urine drug screen, pill counts as well as use of [place] Controlled Substance Reporting system. A 12 month History has been reviewed on the [place] Controlled Substance Reporting System on 02/24/2023. 2. Fibromyalgia. Continue Current exercise Regime. 02/24/2023 3. Anxiety and depression:Psychiatry Following: Counselor Jessica  at [facility] . Continue Counseling at [facility]. 02/24/2023 4. Migraines: On Maxalt. Neurology Following. 02/24/2023 5. OSA : PCP Following. Continue to Monitor. 02/24/2023 6. Obesity: Dr Carlis Abbott Following: Continue  Healthy Diet Regimen and Continue HEP as Tolerated. 02/24/2023 7. Status  Post Cervical Spinal Fusion: C5C6- C-6- C-7 Anterior Cervical Discectomy Fusion and Allograft Plate: Dr. Ophelia Charter Following. 02/24/2023 8. Cervicalgia/ Cervical Radiculitis/ S/P Cervical Spinal Fusion:  Continue to Monitor Dr. Ophelia Charter Following. 02/24/2023 9. Muscle Spasm: Continue Tizanidine as needed.02/24/2023 10.Hereditary and Idiopathic Peripheral Neuropathy Continue with Tens Unit. 02/24/2023. 11.Left Greater Trochanteric Bursitis: . Continue to Alternate Ice and heat Therapy. Continue to Monitor. 02/24/2023. 12. Chronic Bilateral Knee Pain: No Complaints Today. Ortho Following. Continue HEP as Tolerated. Continue to Monitor. 02/24/2023 13. Lateral Epicondylitis of Right Elbow: No complaints today.  Ortho Following. S/P   06/26/2020 Right lateral epicondyle debridement, drilling and repair  By Dr Ophelia Charter. 14. Neuralgia/ Right Facial Droop: Neurology following. Continue to monitor. 02/24/2023. 15. Polyarthralgia: No complaints today. Continue HEP as tolerated. Continue to monitor. 02/24/2023  16. Paresthesia of Right Lower Extremity: No complaints today. Occasionally: Continue current medication regimen. Continue to monitor. 02/24/2023 17 . Right Foot Pain/ Chronic Right Ankle Pain:  S/P Cortisone injection last month by her Podiatrist Dr Regal.We will continue to Monitor.  .    F/U in 1 month .

## 2023-04-04 ENCOUNTER — Other Ambulatory Visit (HOSPITAL_COMMUNITY): Payer: Self-pay | Admitting: Psychiatry

## 2023-04-04 DIAGNOSIS — F411 Generalized anxiety disorder: Secondary | ICD-10-CM

## 2023-04-07 ENCOUNTER — Ambulatory Visit: Payer: Medicare Other | Admitting: Podiatry

## 2023-04-22 ENCOUNTER — Ambulatory Visit (INDEPENDENT_AMBULATORY_CARE_PROVIDER_SITE_OTHER): Payer: Medicare Other

## 2023-04-22 ENCOUNTER — Other Ambulatory Visit (HOSPITAL_COMMUNITY): Payer: Self-pay

## 2023-04-22 ENCOUNTER — Other Ambulatory Visit: Payer: Self-pay

## 2023-04-22 ENCOUNTER — Ambulatory Visit (INDEPENDENT_AMBULATORY_CARE_PROVIDER_SITE_OTHER): Payer: Medicare Other | Admitting: Orthopaedic Surgery

## 2023-04-22 DIAGNOSIS — M25562 Pain in left knee: Secondary | ICD-10-CM | POA: Insufficient documentation

## 2023-04-22 DIAGNOSIS — Z981 Arthrodesis status: Secondary | ICD-10-CM | POA: Diagnosis not present

## 2023-04-22 MED ORDER — LIDOCAINE HCL 1 % IJ SOLN
0.5000 mL | INTRAMUSCULAR | Status: AC | PRN
Start: 2023-04-22 — End: 2023-04-22
  Administered 2023-04-22: .5 mL

## 2023-04-22 MED ORDER — METHYLPREDNISOLONE ACETATE 40 MG/ML IJ SUSP
40.0000 mg | INTRAMUSCULAR | Status: AC | PRN
Start: 2023-04-22 — End: 2023-04-22
  Administered 2023-04-22: 40 mg via INTRA_ARTICULAR

## 2023-04-22 MED ORDER — BUPIVACAINE HCL 0.5 % IJ SOLN
3.0000 mL | INTRAMUSCULAR | Status: AC | PRN
Start: 2023-04-22 — End: 2023-04-22
  Administered 2023-04-22: 3 mL via INTRA_ARTICULAR

## 2023-04-22 NOTE — Progress Notes (Signed)
Office Visit Note   Patient: Karina Greer           Date of Birth: 1966-03-14           MRN: [id number] Visit Date: 04/22/2023              Requested by: Lonie Peak, PA-C [address] [place],  [place] [address] PCP: Lonie Peak, PA-C   Assessment & Plan: Visit Diagnoses:  1. S/P cervical spinal fusion   2. Left knee pain, unspecified chronicity   3.     Cervical spondylosis  Plan: Patient requested a repeat knee injection which last year gave her good relief for a period of time.  Will set up for some physical therapy for her neck for cervical spondylosis with disc base narrowing above her solid fusion.  She has prominent spurs posteriorly and may have some cervical stenosis causing her upper and lower extremity symptoms.  I discussed with her she could talk with her therapist about trying a different antidepressant medication if she could find one that she tolerated I think it certainly would help her pain as well.  Follow-Up Instructions: Return in about 7 weeks (around 06/10/2023).   Orders:  Orders Placed This Encounter  Procedures   Large Joint Inj   XR Cervical Spine 2 or 3 views   Ambulatory referral to Physical Therapy   No orders of the defined types were placed in this encounter.     Procedures: Large Joint Inj: L knee on 04/22/2023 12:43 PM Indications: joint swelling and pain Details: 22 G 1.5 in needle, anterolateral approach  Arthrogram: No  Medications: 0.5 mL lidocaine 1 %; 3 mL bupivacaine 0.5 %; 40 mg methylPREDNISolone acetate 40 MG/ML Outcome: tolerated well, no immediate complications Procedure, treatment alternatives, risks and benefits explained, specific risks discussed. Consent was given by the patient. Immediately prior to procedure a time out was called to verify the correct patient, procedure, equipment, support staff and site/side marked as required. Patient was prepped and draped in the usual sterile fashion.       Clinical  Data: No additional findings.   Subjective: Chief Complaint  Patient presents with   Other    Bilateral leg/foot numbness    HPI 57 year old female returns with ongoing problems with pain when she tries to walk.  She has had pain in her legs numbness in her feet when she stands or walks.  Numbness with prolonged sitting or driving.  Some back pain occasionally.  Pain sometimes wakes her up at night.  She states she was diagnosed with fibromyalgia.  She gets hydrocodone 10/325 90 tablets a month by Jacalyn Lefevre Anp.  Patient states she still in considerable pain.  She has been trying to lose weight.  She has been on some antidepressant medications in the past she does not exactly recall which ones but were not well-tolerated.  She has been seen by behavioral health for generalized anxiety disorder.  It appears that she has had Celexa, Cymbalta, Paxil, nortriptyline, as well as many other medications in the past often with side effects.  Review of Systems all systems noncontributory to HPI.   Objective: Vital Signs: There were no vitals taken for this visit.  Physical Exam Constitutional:      Appearance: She is well-developed.  HENT:     Head: Normocephalic.     Right Ear: External ear normal.     Left Ear: External ear normal. There is no impacted cerumen.  Eyes:  Pupils: Pupils are equal, round, and reactive to light.  Neck:     Thyroid: No thyromegaly.     Trachea: No tracheal deviation.  Cardiovascular:     Rate and Rhythm: Normal rate.  Pulmonary:     Effort: Pulmonary effort is normal.  Abdominal:     Palpations: Abdomen is soft.  Musculoskeletal:     Cervical back: No rigidity.  Skin:    General: Skin is warm and dry.  Neurological:     Mental Status: She is alert and oriented to person, place, and time.  Psychiatric:        Behavior: Behavior normal.     Ortho Exam patient has tenderness left medial knee.  More medial and lateral joint line tenderness 2+  knee effusion crepitus with knee range of motion patellofemoral loading no subluxation.  ACL PCL is normal.  Negative logroll hips negative straight leg raising.  She has some tenderness to the brachial plexus upper extremity reflexes are symmetrical mild sciatic notch tenderness.  Specialty Comments:  No specialty comments available.  Imaging: XR Cervical Spine 2 or 3 views  Result Date: 04/22/2023 AP lateral cervical spine images with lateral flexion-extension x-rays are obtained and reviewed.  This shows fusion C5-6 C6-7 allograft and plate which is solid.  Patient has progressive spondylosis C4-5 greater than 5% loss of disc space height with prominent anterior posterior spurring at C4-5. Impression: Fused C5-6 C6-7.  Progressive spondylosis adjacent level C4-5 with prominent posterior osteophytes .    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 [facility]) 01/09/2023   Erythrocytosis 01/23/2022   Pain in left ankle and joints of left foot 12/14/2021   Family history of blood clots 10/09/2021   Lateral epicondylitis, right elbow 05/25/2019   Atherosclerosis of aorta (HCC) 10/28/2018   Elevated glucose 10/28/2018   Mixed hyperlipidemia 10/28/2018   Hypothyroidism 07/20/2018   LLQ pain 12/03/2017   Pre-diabetes 06/03/2017   Rectal bleeding 06/02/2017   S/P cervical spinal fusion 03/11/2016   Bilateral hand pain 11/29/2015   Elevated C-reactive protein 11/29/2015   Elevated rheumatoid factor 11/29/2015   Raynaud's phenomenon without gangrene 11/29/2015   Bloating 09/12/2014   Dizziness 08/08/2014   Peri-menopause 08/08/2014   Chronic tension-type headache, intractable 07/13/2014   Gastroesophageal reflux disease without esophagitis 07/12/2014   Anxiety 04/18/2014   Chronic arthritis 04/18/2014   Cystitis 04/18/2014   Morbid obesity (HCC) 04/18/2014   Fibromyalgia 04/18/2014    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HCC) 04/18/2014   Osteoporosis, 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   Arthropathy 04/18/2014   Cephalalgia 04/04/2014   Numbness and tingling 04/04/2014   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 02/23/2013   Nonspecific abnormal findings on radiological and other examination of skull and head 02/23/2013   Other nonspecific abnormal result of function study of brain and central nervous system 07/01/2012   Disturbance of skin sensation 07/01/2012   Migraine without aura 07/01/2012   Abdominal pain 02/11/2012   Chronic fatigue syndrome 02/11/2012   Chronic interstitial cystitis 02/11/2012   Dyspareunia 02/11/2012   Dysuria 02/11/2012   Female stress incontinence 02/11/2012   Irritable bowel syndrome with diarrhea 02/11/2012   Nausea and vomiting 02/11/2012   Nocturia 02/11/2012   Urge incontinence 02/11/2012   Urinary urgency 02/11/2012   Lumbar spondylosis 02/04/2012   Myalgia and myositis 02/04/2012   Depression 02/04/2012   Lumbosacral spondylosis without myelopathy 02/04/2012   OBESITY,  UNSPECIFIED 10/03/2010   GENERALIZED ANXIETY DISORDER 10/03/2010   Chest pain 10/03/2010   Past Medical History:  Diagnosis Date   Anxiety    Anxiety disorder    Arthritis    Blepharitis    Chronic low back pain    Chronic pain    Depression    Diabetes mellitus without complication (HCC)    Dyslipidemia    Family history of blood clots    Fibromyalgia    GERD (gastroesophageal reflux disease)    Headache    hx migraines   History of kidney stones    History of panic attacks    History of renal calculi    IBS (irritable bowel syndrome)    Interstitial cystitis    Lupus (HCC)    tested positive for antibodies for lupus. dr to do further tests   Menorrhagia    OSA on CPAP    not used cpap for several weeks   Pneumonia    hx   PONV (postoperative nausea and vomiting)    RLS (restless legs syndrome)    Rosacea    Seasonal asthma    SI (sacroiliac)  joint dysfunction    SUI (stress urinary incontinence, female)    UTI (lower urinary tract infection)    hx   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 02/23/2013    Family History  Problem Relation Age of Onset   Hypertension Mother    Diabetes Father    Brain cancer Father    Clotting disorder Father        blood clots   Fibromyalgia Sister    Suicidality Sister    Hypertension Other        Cancer, Cerebrovascular disease run on mother side of family    Past Surgical History:  Procedure Laterality Date   ACHILLES TENDON SURGERY Right 3/16   ANTERIOR CERVICAL DECOMP/DISCECTOMY FUSION N/A 03/11/2016   Procedure: C5-6, C6-7 Anterior Cervical Discectomy and Fusion, Allograft, Plate;  Surgeon: Eldred Manges, MD;  Location: [facility] OR;  Service: Orthopedics;  Laterality: N/A;   CHOLECYSTECTOMY  2014   COLONOSCOPY WITH PROPOFOL N/A 08/25/2017   Procedure: COLONOSCOPY WITH PROPOFOL;  Surgeon: Scot Jun, MD;  Location: [facility] ENDOSCOPY;  Service: Endoscopy;  Laterality: N/A;   CYSTO WITH HYDRODISTENSION N/A 01/26/2014   Procedure: CYSTOSCOPY/HYDRODISTENSION;  Surgeon: [name], MD;  Location: [facility];  Service: Urology;  Laterality: N/A;   CYSTO/  URETHRAL DILATION/  HYDRODISTENTION/   INSTILLATION THERAPY  07-16-2010//   12-30-2007//   10-27-2006   ELBOW SURGERY Right    EXERCISE TOLERENCE TEST  10-12-2010   NEGATIVE  ADEQUATE ETT/  NO ISCHEMIA OR EVIDENCE HIGH GRADE OBSTRUCTIVE CAD/  NO FURTHER TEST NEEDED   HYSTEROSCOPY W/  NOVASURE ENDOMETRIAL ABLATION  2014   LAPAROSCOPIC OVARIAN CYST BX  2005   AND  URETEROSCOPIC LASER LITHO  STONE EXTRACTION   SHOULDER OPEN ROTATOR CUFF REPAIR Right 2004   TONSILLECTOMY     TRANSTHORACIC ECHOCARDIOGRAM  06-07-2006   normal study/  ef 60-65%   TUBAL LIGATION Bilateral 1995   Social History   Occupational History   Occupation: Designer, industrial/product: UNEMPLOYED    Comment: Disability  Tobacco Use   Smoking status:  Never   Smokeless tobacco: Never  Vaping Use   Vaping status: Never Used  Substance and Sexual Activity   Alcohol use: No   Drug use: No   Sexual activity: Not on file

## 2023-04-23 ENCOUNTER — Ambulatory Visit (INDEPENDENT_AMBULATORY_CARE_PROVIDER_SITE_OTHER): Payer: Medicare Other | Admitting: Podiatry

## 2023-04-23 ENCOUNTER — Other Ambulatory Visit: Payer: Self-pay

## 2023-04-23 ENCOUNTER — Ambulatory Visit (INDEPENDENT_AMBULATORY_CARE_PROVIDER_SITE_OTHER): Payer: Medicare Other

## 2023-04-23 ENCOUNTER — Other Ambulatory Visit: Payer: Self-pay | Admitting: Podiatry

## 2023-04-23 ENCOUNTER — Encounter: Payer: Self-pay | Admitting: Podiatry

## 2023-04-23 DIAGNOSIS — M7662 Achilles tendinitis, left leg: Secondary | ICD-10-CM

## 2023-04-23 DIAGNOSIS — M7661 Achilles tendinitis, right leg: Secondary | ICD-10-CM

## 2023-04-23 DIAGNOSIS — M722 Plantar fascial fibromatosis: Secondary | ICD-10-CM

## 2023-04-23 DIAGNOSIS — M79671 Pain in right foot: Secondary | ICD-10-CM

## 2023-04-23 MED ORDER — TRIAMCINOLONE ACETONIDE 10 MG/ML IJ SUSP
10.0000 mg | Freq: Once | INTRAMUSCULAR | Status: AC
Start: 2023-04-23 — End: 2023-04-23
  Administered 2023-04-23: 10 mg via INTRA_ARTICULAR

## 2023-04-23 NOTE — Progress Notes (Signed)
Subjective:   Patient ID: Karina Greer, female   DOB: 57 y.o.   MRN: [id number]   HPI Patient states she has a lot of pain in the bottom of her right heel and the back of her right heel and states the last injection did not seem to be doing anything and she has been wearing a tall boot   ROS      Objective:  Physical Exam  Neuro vascular status intact inflammation pain posterior lateral aspect right heel but pain is more in the plantar proximal portion of the fascia with fluid buildup     Assessment:  Acute Planter fasciitis more proximal right with the possibility for Achilles tendinitis or compensatory with history of surgery in number years ago     Plan:  H&P reviewed did a careful proximal injection of the plantar fascia 3 mg Dexasone Kenalog 5 mg Xylocaine discussed boot usage will continue with that will reappoint 4 weeks and may eventually require more aggressive procedure but also having trouble with neck and left knee  X-rays indicate I do not see an increase in spur size indications of stress fracture or other bony pathology currently with spur present and history of implant

## 2023-04-25 ENCOUNTER — Ambulatory Visit: Payer: Medicare Other | Attending: Orthopaedic Surgery | Admitting: Physical Therapy

## 2023-04-25 DIAGNOSIS — R209 Unspecified disturbances of skin sensation: Secondary | ICD-10-CM

## 2023-04-25 DIAGNOSIS — M6281 Muscle weakness (generalized): Secondary | ICD-10-CM

## 2023-04-25 DIAGNOSIS — Z981 Arthrodesis status: Secondary | ICD-10-CM

## 2023-04-25 DIAGNOSIS — M542 Cervicalgia: Secondary | ICD-10-CM

## 2023-04-25 DIAGNOSIS — M79643 Pain in unspecified hand: Secondary | ICD-10-CM | POA: Diagnosis not present

## 2023-04-25 NOTE — Therapy (Signed)
OUTPATIENT PHYSICAL THERAPY CERVICAL EVALUATION   Patient Name: Karina Greer MRN: [id number] DOB:[birth year], 57 y.o., female Today's Date: 04/25/2023  END OF SESSION:  PT End of Session - 04/25/23 1024     Visit Number 1    Number of Visits 16    Date for PT Re-Evaluation 06/20/23    Authorization Type MCR, MCD [place]    PT Start Time 1020    PT Stop Time 1100    PT Time Calculation (min) 40 min    Activity Tolerance Patient tolerated treatment well    Behavior During Therapy [facility] for tasks assessed/performed;Restless             Past Medical History:  Diagnosis Date   Anxiety    Anxiety disorder    Arthritis    Blepharitis    Chronic low back pain    Chronic pain    Depression    Diabetes mellitus without complication (HCC)    Dyslipidemia    Family history of blood clots    Fibromyalgia    GERD (gastroesophageal reflux disease)    Headache    hx migraines   History of kidney stones    History of panic attacks    History of renal calculi    IBS (irritable bowel syndrome)    Interstitial cystitis    Lupus (HCC)    tested positive for antibodies for lupus. 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   Past Surgical History:  Procedure Laterality Date   ACHILLES TENDON SURGERY Right 3/16   ANTERIOR CERVICAL DECOMP/DISCECTOMY FUSION N/A 03/11/2016   Procedure: C5-6, C6-7 Anterior Cervical Discectomy and Fusion, Allograft, Plate;  Surgeon: Eldred Manges, MD;  Location: [facility] OR;  Service: Orthopedics;  Laterality: N/A;   CHOLECYSTECTOMY  2014   COLONOSCOPY WITH PROPOFOL N/A 08/25/2017   Procedure: COLONOSCOPY WITH  PROPOFOL;  Surgeon: Scot Jun, MD;  Location: [facility] ENDOSCOPY;  Service: Endoscopy;  Laterality: N/A;   CYSTO WITH HYDRODISTENSION N/A 01/26/2014   Procedure: CYSTOSCOPY/HYDRODISTENSION;  Surgeon: [name], MD;  Location: [facility];  Service: Urology;  Laterality: N/A;   CYSTO/  URETHRAL DILATION/  HYDRODISTENTION/   INSTILLATION THERAPY  07-16-2010//   12-30-2007//   10-27-2006   ELBOW SURGERY Right    EXERCISE TOLERENCE TEST  10-12-2010   NEGATIVE  ADEQUATE ETT/  NO ISCHEMIA OR EVIDENCE HIGH GRADE OBSTRUCTIVE CAD/  NO FURTHER TEST NEEDED   HYSTEROSCOPY W/  NOVASURE ENDOMETRIAL ABLATION  2014   LAPAROSCOPIC OVARIAN CYST BX  2005   AND  URETEROSCOPIC LASER LITHO  STONE EXTRACTION   SHOULDER OPEN ROTATOR CUFF REPAIR Right 2004   TONSILLECTOMY     TRANSTHORACIC ECHOCARDIOGRAM  06-07-2006   normal study/  ef 60-65%   TUBAL LIGATION Bilateral 1995   Patient Active Problem List   Diagnosis Date Noted   Pain in left knee 04/22/2023   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 [facility]) 01/09/2023   Erythrocytosis 01/23/2022   Pain in left ankle and  joints of left foot 12/14/2021   Family history of blood clots 10/09/2021   Lateral epicondylitis, right elbow 05/25/2019   Atherosclerosis of aorta (HCC) 10/28/2018   Elevated glucose 10/28/2018   Mixed hyperlipidemia 10/28/2018   Hypothyroidism 07/20/2018   LLQ pain 12/03/2017   Pre-diabetes 06/03/2017   Rectal bleeding 06/02/2017   S/P cervical spinal fusion 03/11/2016   Bilateral hand pain 11/29/2015   Elevated C-reactive protein 11/29/2015   Elevated rheumatoid factor 11/29/2015   Raynaud's phenomenon without gangrene 11/29/2015   Bloating 09/12/2014   Dizziness 08/08/2014   Peri-menopause 08/08/2014   Chronic tension-type headache, intractable 07/13/2014   Gastroesophageal reflux disease without esophagitis 07/12/2014   Anxiety 04/18/2014   Chronic arthritis 04/18/2014   Cystitis 04/18/2014   Morbid obesity  (HCC) 04/18/2014   Fibromyalgia 04/18/2014   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HCC) 04/18/2014   Osteoporosis, 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   Arthropathy 04/18/2014   Cephalalgia 04/04/2014   Numbness and tingling 04/04/2014   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 02/23/2013   Nonspecific abnormal findings on radiological and other examination of skull and head 02/23/2013   Other nonspecific abnormal result of function study of brain and central nervous system 07/01/2012   Disturbance of skin sensation 07/01/2012   Migraine without aura 07/01/2012   Abdominal pain 02/11/2012   Chronic fatigue syndrome 02/11/2012   Chronic interstitial cystitis 02/11/2012   Dyspareunia 02/11/2012   Dysuria 02/11/2012   Female stress incontinence 02/11/2012   Irritable bowel syndrome with diarrhea 02/11/2012   Nausea and vomiting 02/11/2012   Nocturia 02/11/2012   Urge incontinence 02/11/2012   Urinary urgency 02/11/2012   Lumbar spondylosis 02/04/2012   Myalgia and myositis 02/04/2012   Depression 02/04/2012   Lumbosacral spondylosis without myelopathy 02/04/2012   OBESITY, UNSPECIFIED 10/03/2010   GENERALIZED ANXIETY DISORDER 10/03/2010   Chest pain 10/03/2010    PCP: Lonie Peak PA-C  REFERRING PROVIDER: Eldred Manges, MD  REFERRING DIAG: Z98.1 (ICD-10-CM) - S/P cervical spinal fusion  THERAPY DIAG:  S/P cervical spinal fusion  Cervicalgia  Sensory disturbance  Muscle weakness (generalized)  Rationale for Evaluation and Treatment: Rehabilitation  ONSET DATE: chronic   SUBJECTIVE:                                                                                                                                                                                                         SUBJECTIVE STATEMENT: Patient had neck surgery in 2017.  She reports she never really had relief of her symptoms. For the past several months? She has had symptoms that she  did not realize may be coming  from her neck. She went to Dr. Ophelia Charter this past week.  She has balance issues, arms and legs go numb.  This happens mostly when she is sitting, driving or even walking in the grocery store.  She has been trying to lose weight by walking on the treadmill but within minutes both legs are numb and she feels her ankles getting weak.  At the time of the surgery she said he told her she might need surgery in the future for the levels above her fusion.   She does endorse "almost falling" quite a bit.  She has difficulty sleeping, and positioning at night.  She has difficulty with fine motor tasks and is unable to unopened cans jars at home.  When she turns her head, moves too quickly or lays down flat she reports dizziness and nausea every day.  The doctor said this could be coming from her neck.  She at times has worn her neck collar for relief.  She is hopeful that physical therapy can delay or eliminate the need for surgery    Hand dominance: Right  PERTINENT HISTORY:  Fibromyalgia, back pain, hip pain ,right shoulder rotator cuff repair, right foot surgery ,Achilles tendinitis, obesity, anxiety, osteoporosis, left knee pain  Dr. Ophelia Charter: Patient requested a repeat knee injection which last year gave her good relief for a period of time. Will set up for some physical therapy for her neck for cervical spondylosis with disc base narrowing above her solid fusion. She has prominent spurs posteriorly and may have some cervical stenosis causing her upper and lower extremity symptoms.   She has had pain in her legs numbness in her feet when she stands or walks. Numbness with prolonged sitting or driving. Some back pain occasionally. Pain sometimes wakes her up at night. She states she was diagnosed with fibromyalgia.  PAIN:  Are you having pain? Yes: NPRS scale: 5/10 Pain location: neck and shoulders, (also has pain not rated in back, bilateral LEs, L knee and Rt ankle, low back, RT arm )  Pain description:  sore Aggravating factors: activity, prolonged sitting Relieving factors: neck pillow, meds, rest  L knee pain with shot in her knee Rt achilles with shot    PRECAUTIONS: Other: none   RED FLAGS: None     WEIGHT BEARING RESTRICTIONS: No  FALLS:  Has patient fallen in last 6 months? 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 I want to get rid of this dizziness too.   NEXT MD VISIT: Unknown  OBJECTIVE:   DIAGNOSTIC FINDINGS:  Impression: Fused C5-6 C6-7.  Progressive spondylosis adjacent level C4-5  with prominent posterior osteophytes .   PATIENT SURVEYS:  FOTO 42%  COGNITION: Overall cognitive status: Within functional limits for tasks assessed  SENSATION: WFL UE and LE numb and tingly daily, off and on.  POSTURE: rounded shoulders, forward head, and increased thoracic kyphosis  PALPATION: Pain with palpation to right upper trap and suboccipitals posterior cervicals   CERVICAL ROM:   Active ROM A/PROM (deg) eval  Flexion 46  Extension 55  Right lateral flexion 45  Left lateral flexion 45  Right rotation WFL  Left rotation Pain on L , WFL   (Blank rows = not tested)  UPPER EXTREMITY ROM: Eval: WFLs   Active ROM Right eval Left eval  Shoulder flexion    Shoulder extension    Shoulder abduction    Shoulder adduction    Shoulder extension    Shoulder internal rotation    Shoulder external rotation    Elbow flexion    Elbow extension    Wrist flexion    Wrist extension    Wrist ulnar deviation    Wrist radial deviation    Wrist pronation    Wrist supination     (Blank  rows = not tested)  UPPER EXTREMITY MMT:  MMT Right eval Left eval  Shoulder flexion 4- 4  Shoulder extension    Shoulder abduction 3+ 4  Shoulder adduction    Shoulder extension    Shoulder internal rotation    Shoulder external rotation    Middle trapezius    Lower trapezius    Elbow flexion 4 4  Elbow extension 4 4  Wrist flexion    Wrist extension    Wrist ulnar deviation    Wrist radial deviation    Wrist pronation    Wrist supination    Grip strength 40 42   (Blank rows = not tested)  CERVICAL SPECIAL TESTS:  Neck flexor muscle endurance test: NT on eval  and Distraction test: no change    LOWER EXTREMITY MMT:  WFLs  MMT Right eval Left eval  Hip flexion    Hip extension    Hip abduction    Hip adduction    Hip internal rotation    Hip external rotation    Knee flexion    Knee extension    Ankle dorsiflexion    Ankle plantarflexion    Ankle inversion    Ankle eversion     (Blank rows = not tested)    FUNCTIONAL TESTS:  5 times sit to stand: 11 sec   TODAY'S TREATMENT:                                                                                                                              DATE: 04/25/23   PATIENT EDUCATION:  Education details: PT, POC, balance, causes of dizziness, role of activity with wgt loss  Person educated: Patient Education method: Explanation Education comprehension: verbalized understanding  HOME EXERCISE PROGRAM: Not done on eval   ASSESSMENT:  CLINICAL IMPRESSION: Patient is a 57 y.o. female who was seen today for physical therapy evaluation and treatment for neck pain, UE symptoms 7 yrs post cervical fusion.  Patient has chronic pain in multiple joints which limited the time we had to spend on developing a home routine.  Apparently the levels above her fusion are now increasing symptoms in her upper body right arm more symptomatic than  the left.  Her lower body is strong but does say that when she is standing and  walking she feels a functional weakness in her right leg mostly.  She agrees to physical therapy in hopes to improve upper body strength and symptoms so that she may not need surgery.  OBJECTIVE IMPAIRMENTS: decreased balance, decreased coordination, decreased endurance, decreased mobility, difficulty walking, decreased ROM, decreased strength, dizziness, hypomobility, increased fascial restrictions, impaired flexibility, impaired sensation, impaired UE functional use, postural dysfunction, obesity, and pain.   ACTIVITY LIMITATIONS: carrying, lifting, bending, sitting, standing, squatting, sleeping, transfers, bed mobility, reach over head, and locomotion level  PARTICIPATION LIMITATIONS: meal prep, cleaning, laundry, interpersonal relationship, driving, shopping, and community activity  PERSONAL FACTORS: Past/current experiences, Social background, Time since onset of injury/illness/exacerbation, and 3+ comorbidities: fibro, Rt ankle pain, L knee pain  are also affecting patient's functional outcome.   REHAB POTENTIAL: Good  CLINICAL DECISION MAKING: Evolving/moderate complexity  EVALUATION COMPLEXITY: Moderate   GOALS: Goals reviewed with patient? Yes SHORT TERM GOALS: Target date: 05/23/2023     Patient will be independent with home program for posture strength and mobility patient will be able to Baseline:  Goal status: INITIAL  2.  Patient will be able to report 3 self-care strategies for pain relief Baseline:  Goal status: INITIAL  3.  Patient will be able to complete functional testing /balance screen and have goals set Baseline:  Goal status: INITIAL   LONG TERM GOALS: Target date: 06/06/2023   Patient will improve Foto score to 50% or better to demonstrate improved functional mobility Baseline:  Goal status: INITIAL  2.  Pt will be able to walk on the treadmill 10 minutes every day to build the habit of exercise Baseline: LE numbness increases  Goal status:  INITIAL  3.  Patient will be able to report sensory symptoms in upper body improved by 25% Baseline: daily, moderate throughout the day  Goal status: INITIAL  4.  Patient will be able to complete light housework with no increase in pain Baseline: moderate pain in neck and UEs  Goal status: INITIAL  5.  Patient will be able to fully perform active range of motion in cervical spine without increased pain Baseline: Pain with left rotation and sidebending right and left Goal status: INITIAL  6.  Patient will be able to lift 10 pounds from the floor to shoulder height x 10 to demo improved functional strength Baseline:  Goal status: INITIAL   PLAN:  PT FREQUENCY: 2x/week  PT DURATION: 8 weeks  PLANNED INTERVENTIONS: Therapeutic exercises, Therapeutic activity, Neuromuscular re-education, Balance training, Gait training, Patient/Family education, Self Care, Joint mobilization, Aquatic Therapy, Dry Needling, Spinal mobilization, Cryotherapy, Moist heat, Taping, Manual therapy, and Re-evaluation  PLAN FOR NEXT SESSION: HEP! Gentle cervical ROM, shoulder ROM and strength, posture, modalities as needed   ,, PT 04/25/2023, 3:02 PM   [name], PT 04/25/23 3:02 PM Phone: [phone] Fax: [phone]

## 2023-05-02 ENCOUNTER — Other Ambulatory Visit: Payer: Self-pay | Admitting: Physical Medicine and Rehabilitation

## 2023-05-02 ENCOUNTER — Other Ambulatory Visit: Payer: Self-pay

## 2023-05-03 MED ORDER — TIZANIDINE HCL 2 MG PO TABS
2.0000 mg | ORAL_TABLET | Freq: Three times a day (TID) | ORAL | 2 refills | Status: DC | PRN
  Filled 2023-05-03: qty 50, 17d supply, fill #0
  Filled 2024-04-09: qty 50, 17d supply, fill #1

## 2023-05-04 ENCOUNTER — Other Ambulatory Visit: Payer: Self-pay

## 2023-05-05 NOTE — Therapy (Unsigned)
OUTPATIENT PHYSICAL THERAPY TREATMENT   Patient Name: Karina Greer MRN: [id number] DOB:June 07, 1966, 57 y.o., female Today's Date: 05/06/2023  END OF SESSION:  PT End of Session - 05/06/23 1214     Visit Number 2    Number of Visits 16    Date for PT Re-Evaluation 06/20/23    Authorization Type MCR, MCD [place]    PT Start Time 1225    PT Stop Time 1315    PT Time Calculation (min) 50 min    Activity Tolerance Patient tolerated treatment well    Behavior During Therapy [facility] for tasks assessed/performed;Restless              Past Medical History:  Diagnosis Date   Anxiety    Anxiety disorder    Arthritis    Blepharitis    Chronic low back pain    Chronic pain    Depression    Diabetes mellitus without complication (HCC)    Dyslipidemia    Family history of blood clots    Fibromyalgia    GERD (gastroesophageal reflux disease)    Headache    hx migraines   History of kidney stones    History of panic attacks    History of renal calculi    IBS (irritable bowel syndrome)    Interstitial cystitis    Lupus (HCC)    tested positive for antibodies for lupus. 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   Past Surgical History:  Procedure Laterality Date   ACHILLES TENDON SURGERY Right 3/16   ANTERIOR CERVICAL DECOMP/DISCECTOMY FUSION N/A 03/11/2016   Procedure: C5-6, C6-7 Anterior Cervical Discectomy and Fusion, Allograft, Plate;  Surgeon: Eldred Manges, MD;  Location: [facility] OR;  Service: Orthopedics;  Laterality: N/A;   CHOLECYSTECTOMY  2014   COLONOSCOPY WITH PROPOFOL N/A 08/25/2017   Procedure: COLONOSCOPY WITH PROPOFOL;   Surgeon: Scot Jun, MD;  Location[facility] ENDOSCOPY;  Service: Endoscopy;  Laterality: N/A;   CYSTO WITH HYDRODISTENSION N/A 01/26/2014   Procedure: CYSTOSCOPY/HYDRODISTENSION;  Surgeon: [name], MD;  Location: [facility];  Service: Urology;  Laterality: N/A;   CYSTO/  URETHRAL DILATION/  HYDRODISTENTION/   INSTILLATION THERAPY  07-16-2010//   12-30-2007//   10-27-2006   ELBOW SURGERY Right    EXERCISE TOLERENCE TEST  10-12-2010   NEGATIVE  ADEQUATE ETT/  NO ISCHEMIA OR EVIDENCE HIGH GRADE OBSTRUCTIVE CAD/  NO FURTHER TEST NEEDED   HYSTEROSCOPY W/  NOVASURE ENDOMETRIAL ABLATION  2014   LAPAROSCOPIC OVARIAN CYST BX  2005   AND  URETEROSCOPIC LASER LITHO  STONE EXTRACTION   SHOULDER OPEN ROTATOR CUFF REPAIR Right 2004   TONSILLECTOMY     TRANSTHORACIC ECHOCARDIOGRAM  06-07-2006   normal study/  ef 60-65%   TUBAL LIGATION Bilateral 1995   Patient Active Problem List   Diagnosis Date Noted   Pain in left knee 04/22/2023   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 [facility]) 01/09/2023   Erythrocytosis 01/23/2022   Pain in left ankle and  joints of left foot 12/14/2021   Family history of blood clots 10/09/2021   Lateral epicondylitis, right elbow 05/25/2019   Atherosclerosis of aorta (HCC) 10/28/2018   Elevated glucose 10/28/2018   Mixed hyperlipidemia 10/28/2018   Hypothyroidism 07/20/2018   LLQ pain 12/03/2017   Pre-diabetes 06/03/2017   Rectal bleeding 06/02/2017   S/P cervical spinal fusion 03/11/2016   Bilateral hand pain 11/29/2015   Elevated C-reactive protein 11/29/2015   Elevated rheumatoid factor 11/29/2015   Raynaud's phenomenon without gangrene 11/29/2015   Bloating 09/12/2014   Dizziness 08/08/2014   Peri-menopause 08/08/2014   Chronic tension-type headache, intractable 07/13/2014   Gastroesophageal reflux disease without esophagitis 07/12/2014   Anxiety 04/18/2014   Chronic arthritis 04/18/2014   Cystitis 04/18/2014   Morbid obesity (HCC)  04/18/2014   Fibromyalgia 04/18/2014   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HCC) 04/18/2014   Osteoporosis, 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   Arthropathy 04/18/2014   Cephalalgia 04/04/2014   Numbness and tingling 04/04/2014   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 02/23/2013   Nonspecific abnormal findings on radiological and other examination of skull and head 02/23/2013   Other nonspecific abnormal result of function study of brain and central nervous system 07/01/2012   Disturbance of skin sensation 07/01/2012   Migraine without aura 07/01/2012   Abdominal pain 02/11/2012   Chronic fatigue syndrome 02/11/2012   Chronic interstitial cystitis 02/11/2012   Dyspareunia 02/11/2012   Dysuria 02/11/2012   Female stress incontinence 02/11/2012   Irritable bowel syndrome with diarrhea 02/11/2012   Nausea and vomiting 02/11/2012   Nocturia 02/11/2012   Urge incontinence 02/11/2012   Urinary urgency 02/11/2012   Lumbar spondylosis 02/04/2012   Myalgia and myositis 02/04/2012   Depression 02/04/2012   Lumbosacral spondylosis without myelopathy 02/04/2012   OBESITY, UNSPECIFIED 10/03/2010   GENERALIZED ANXIETY DISORDER 10/03/2010   Chest pain 10/03/2010    PCP: Lonie Peak PA-C  REFERRING PROVIDER: Eldred Manges, MD  REFERRING DIAG: Z98.1 (ICD-10-CM) - S/P cervical spinal fusion  THERAPY DIAG:  S/P cervical spinal fusion  Cervicalgia  Sensory disturbance  Muscle weakness (generalized)  Rationale for Evaluation and Treatment: Rehabilitation  ONSET DATE: chronic   SUBJECTIVE:                                                                                                                                                                                                         SUBJECTIVE STATEMENT: I have had some headaches and I think they may be from my neck.  Non stop 3-4 mos. Mostly my Achilles hurts today.  My balance is  off and when I have the pain in my foot I stumble.   Also headaches may impact her balance.   Hand dominance: Right  PERTINENT HISTORY:  Fibromyalgia, back pain, hip pain ,right shoulder rotator cuff repair, right foot surgery ,Achilles tendinitis, obesity, anxiety, osteoporosis, left knee pain  Dr. Ophelia Charter: Patient requested a repeat knee injection which last year gave her good relief for a period of time. Will set up for some physical therapy for her neck for cervical spondylosis with disc base narrowing above her solid fusion. She has prominent spurs posteriorly and may have some cervical stenosis causing her upper and lower extremity symptoms.   She has had pain in her legs numbness in her feet when she stands or walks. Numbness with prolonged sitting or driving. Some back pain occasionally. Pain sometimes wakes her up at night. She states she was diagnosed with fibromyalgia.  PAIN:  Are you having pain? Yes: NPRS scale: 6/10 Pain location: neck and shoulders, (also has pain not rated in back, bilateral LEs, L knee and Rt ankle, low back, RT arm )  Pain description: sore Aggravating factors: activity, prolonged sitting Relieving factors: neck pillow, meds, rest  L knee pain with shot in her knee Rt achilles with shot 05/06/23: Rt achilles pain 9/10   PRECAUTIONS: Other: none   RED FLAGS: None     WEIGHT BEARING RESTRICTIONS: No  FALLS:  Has patient fallen in last 6 months? 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 I want to get rid of this dizziness too.   NEXT MD VISIT: Unknown  OBJECTIVE:   DIAGNOSTIC FINDINGS:  Impression: Fused C5-6 C6-7.   Progressive spondylosis adjacent level C4-5  with prominent posterior osteophytes .   PATIENT SURVEYS:  FOTO 42%  COGNITION: Overall cognitive status: Within functional limits for tasks assessed  SENSATION: WFL UE and LE numb and tingly daily, off and on.  POSTURE: rounded shoulders, forward head, and increased thoracic kyphosis  PALPATION: Pain with palpation to right upper trap and suboccipitals posterior cervicals   CERVICAL ROM:   Active ROM A/PROM (deg) eval  Flexion 46  Extension 55  Right lateral flexion 45  Left lateral flexion 45  Right rotation WFL  Left rotation Pain on L , WFL   (Blank rows = not tested)  UPPER EXTREMITY ROM: Eval: WFLs   Active ROM Right eval Left eval  Shoulder flexion    Shoulder extension    Shoulder abduction    Shoulder adduction    Shoulder extension    Shoulder internal rotation    Shoulder external rotation    Elbow flexion    Elbow extension    Wrist flexion    Wrist extension    Wrist ulnar deviation    Wrist radial deviation    Wrist pronation    Wrist supination     (Blank rows = not tested)  UPPER EXTREMITY MMT:  MMT Right eval Left eval  Shoulder flexion 4- 4  Shoulder extension    Shoulder abduction 3+ 4  Shoulder adduction    Shoulder extension  Shoulder internal rotation    Shoulder external rotation    Middle trapezius    Lower trapezius    Elbow flexion 4 4  Elbow extension 4 4  Wrist flexion    Wrist extension    Wrist ulnar deviation    Wrist radial deviation    Wrist pronation    Wrist supination    Grip strength 40 42   (Blank rows = not tested)  CERVICAL SPECIAL TESTS:  Neck flexor muscle endurance test: NT on eval  and Distraction test: no change    LOWER EXTREMITY MMT:  WFLs  MMT Right eval Left eval  Hip flexion    Hip extension    Hip abduction    Hip adduction    Hip internal rotation    Hip external rotation    Knee flexion    Knee extension    Ankle dorsiflexion     Ankle plantarflexion    Ankle inversion    Ankle eversion     (Blank rows = not tested)    FUNCTIONAL TESTS:  5 times sit to stand: 11 sec   TODAY'S TREATMENT:   [facility] Adult PT Treatment:                                                DATE: 05/06/23 Therapeutic Exercise: Supine LTR with head turns  Supine chin tuck x 10 added scapular retraction x 10  Supine abdominal draw -in x 10  Added march  Added UE arcs and combo UE/LE mod cues increased time  Horizontal abduction red band  x 10  Seated on dynadisc core balance for the following  Overhead arms red band  Upper trunk rotation with Pilates circle x 5  Alt. Knee extension on disc Alt. UE and LE lift x 10  Eyes closed Red band UE ER/IR attached  Cervical AROM, rotation, sidebending  BERG BALANCE  Sitting to Standing: Numbers; 0-4: 4 2. Standing unsupported: Numbers; 0-4: 4 3. Sitting unsupported: Numbers; 0-4: 4 4 Standing to Sitting: Numbers; 0-4: 4 5. Transfers: Numbers; 0-4: 4 6. Standing with eyes closed: Numbers; 0-4: 4 7. Standing with feet together: Numbers; 0-4: 4 8. Reaching forward with outstretched arm: Numbers; 0-4: 4 9. Retrieving object from the floor: Numbers; 0-4: 4 10. Turning to look behind: Numbers; 0-4: 4 11. Turning 360 degrees: Numbers; 0-4: 4 12. Place alternate foot on stool: Numbers; 0-4: 4 13. Standing with one foot in front: Numbers; 0-4: 4 14. Standing on one foot: Numbers; 0-4: 4  Total Score: 56/56                                                                                                                             DATE: 04/25/23   PATIENT EDUCATION:  Education details: PT, POC, balance, causes of dizziness, role  of activity with wgt loss  Person educated: Patient Education method: Explanation Education comprehension: verbalized understanding  HOME EXERCISE PROGRAM: Access Code: [facility] URL: [email or web address] Date: 05/06/2023 Prepared by: [name]  Exercises - Standing Cervical Rotation AROM with Overpressure  - 1 x daily - 7 x weekly - 2 sets - 10 reps - 10-15 hold - Seated Cervical Sidebending Stretch  - 1 x daily - 7 x weekly - 2 sets - 10 reps - 10-15 hold - Seated Scapular Retraction  - 1 x daily - 7 x weekly - 2 sets - 10 reps - 5 hold - Seated Cervical Retraction  - 1 x daily - 7 x weekly - 2 sets - 10 reps - 5 hold - Seated Marching with Opposite Shoulder Flexion  - 1 x daily - 7 x weekly - 2 sets - 10 reps - 5 hold - Standing Shoulder Horizontal Abduction with Resistance  - 1 x daily - 7 x weekly - 2 sets - 10 reps - 5 hold    ASSESSMENT:  CLINICAL IMPRESSION: Pt scored 56/56 on her Berg Balance test.  DGI may reveal other balance issues but pain was high in her Rt achilles so we deferred it today. Focused on posture and core stability, coordinating breath with movement.  She was able to do the exercises without increasing pain .  Will see tomorrow for more general mobility and begin cardio/walking.    OBJECTIVE IMPAIRMENTS: decreased balance, decreased coordination, decreased endurance, decreased mobility, difficulty walking, decreased ROM, decreased strength, dizziness, hypomobility, increased fascial restrictions, impaired flexibility, impaired sensation, impaired UE functional use, postural dysfunction, obesity, and pain.   ACTIVITY LIMITATIONS: carrying, lifting, bending, sitting, standing, squatting, sleeping, transfers, bed mobility, reach over head, and locomotion level  PARTICIPATION LIMITATIONS: meal prep, cleaning, laundry, interpersonal relationship, driving, shopping, and community activity  PERSONAL FACTORS: Past/current experiences, Social background, Time since onset of injury/illness/exacerbation, and 3+ comorbidities: fibro, Rt ankle pain, L knee pain  are also affecting patient's functional outcome.   REHAB POTENTIAL: Good  CLINICAL DECISION MAKING: Evolving/moderate complexity  EVALUATION  COMPLEXITY: Moderate   GOALS: Goals reviewed with patient? Yes SHORT TERM GOALS: Target date: 05/23/2023     Patient will be independent with home program for posture strength and mobility patient will be able to Baseline:  Goal status: INITIAL  2.  Patient will be able to report 3 self-care strategies for pain relief Baseline:  Goal status: INITIAL  3.  Patient will be able to complete functional testing /balance screen and have goals set Baseline:  Goal status: INITIAL   LONG TERM GOALS: Target date: 06/06/2023   Patient will improve Foto score to 50% or better to demonstrate improved functional mobility Baseline:  Goal status: INITIAL  2.  Pt will be able to walk on the treadmill 10 minutes every day to build the habit of exercise Baseline: LE numbness increases  Goal status: INITIAL  3.  Patient will be able to report sensory symptoms in upper body improved by 25% Baseline: daily, moderate throughout the day  Goal status: INITIAL  4.  Patient will be able to complete light housework with no increase in pain Baseline: moderate pain in neck and UEs  Goal status: INITIAL  5.  Patient will be able to fully perform active range of motion in cervical spine without increased pain Baseline: Pain with left rotation and sidebending right and left Goal status: INITIAL  6.  Patient will be able to lift 10 pounds from  the floor to shoulder height x 10 to demo improved functional strength Baseline:  Goal status: INITIAL   PLAN:  PT FREQUENCY: 2x/week  PT DURATION: 8 weeks  PLANNED INTERVENTIONS: Therapeutic exercises, Therapeutic activity, Neuromuscular re-education, Balance training, Gait training, Patient/Family education, Self Care, Joint mobilization, Aquatic Therapy, Dry Needling, Spinal mobilization, Cryotherapy, Moist heat, Taping, Manual therapy, and Re-evaluation  PLAN FOR NEXT SESSION: HEP! Gentle cervical ROM, shoulder ROM and strength, posture, modalities as  needed   ,, PT 05/06/2023, 1:23 PM   [name], PT 05/06/23 1:23 PM Phone: [phone] Fax: [phone]

## 2023-05-06 ENCOUNTER — Encounter: Payer: Self-pay | Admitting: Physical Therapy

## 2023-05-06 ENCOUNTER — Ambulatory Visit: Payer: Medicare Other | Admitting: Physical Therapy

## 2023-05-06 DIAGNOSIS — Z981 Arthrodesis status: Secondary | ICD-10-CM

## 2023-05-06 DIAGNOSIS — R209 Unspecified disturbances of skin sensation: Secondary | ICD-10-CM | POA: Diagnosis not present

## 2023-05-06 DIAGNOSIS — M6281 Muscle weakness (generalized): Secondary | ICD-10-CM | POA: Diagnosis not present

## 2023-05-06 DIAGNOSIS — M542 Cervicalgia: Secondary | ICD-10-CM

## 2023-05-06 NOTE — Therapy (Unsigned)
OUTPATIENT PHYSICAL THERAPY TREATMENT   Patient Name: Karina Greer MRN: [id number] DOB:[birth year], 57 y.o., female Today's Date: 05/07/2023  END OF SESSION:  PT End of Session - 05/07/23 1121     Visit Number 3    Number of Visits 16    Date for PT Re-Evaluation 06/20/23    Authorization Type MCR, MCD [place]    PT Start Time 1105    PT Stop Time 1145    PT Time Calculation (min) 40 min    Activity Tolerance Patient tolerated treatment well    Behavior During Therapy WFL for tasks assessed/performed               Past Medical History:  Diagnosis Date   Anxiety    Anxiety disorder    Arthritis    Blepharitis    Chronic low back pain    Chronic pain    Depression    Diabetes mellitus without complication (HCC)    Dyslipidemia    Family history of blood clots    Fibromyalgia    GERD (gastroesophageal reflux disease)    Headache    hx migraines   History of kidney stones    History of panic attacks    History of renal calculi    IBS (irritable bowel syndrome)    Interstitial cystitis    Lupus (HCC)    tested positive for antibodies for lupus. 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   Past Surgical History:  Procedure Laterality Date   ACHILLES TENDON SURGERY Right 3/16   ANTERIOR CERVICAL DECOMP/DISCECTOMY FUSION N/A 03/11/2016   Procedure: C5-6, C6-7 Anterior Cervical Discectomy and Fusion, Allograft, Plate;  Surgeon: Eldred Manges, MD;  Location: [facility] OR;  Service: Orthopedics;  Laterality: N/A;   CHOLECYSTECTOMY  2014   COLONOSCOPY WITH PROPOFOL N/A 08/25/2017   Procedure: COLONOSCOPY WITH PROPOFOL;  Surgeon:  Scot Jun, MD;  Location: [facility] ENDOSCOPY;  Service: Endoscopy;  Laterality: N/A;   CYSTO WITH HYDRODISTENSION N/A 01/26/2014   Procedure: CYSTOSCOPY/HYDRODISTENSION;  Surgeon: [name], MD;  Location: [facility];  Service: Urology;  Laterality: N/A;   CYSTO/  URETHRAL DILATION/  HYDRODISTENTION/   INSTILLATION THERAPY  07-16-2010//   12-30-2007//   10-27-2006   ELBOW SURGERY Right    EXERCISE TOLERENCE TEST  10-12-2010   NEGATIVE  ADEQUATE ETT/  NO ISCHEMIA OR EVIDENCE HIGH GRADE OBSTRUCTIVE CAD/  NO FURTHER TEST NEEDED   HYSTEROSCOPY W/  NOVASURE ENDOMETRIAL ABLATION  2014   LAPAROSCOPIC OVARIAN CYST BX  2005   AND  URETEROSCOPIC LASER LITHO  STONE EXTRACTION   SHOULDER OPEN ROTATOR CUFF REPAIR Right 2004   TONSILLECTOMY     TRANSTHORACIC ECHOCARDIOGRAM  06-07-2006   normal study/  ef 60-65%   TUBAL LIGATION Bilateral 1995   Patient Active Problem List   Diagnosis Date Noted   Pain in left knee 04/22/2023   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 [facility]) 01/09/2023   Erythrocytosis 01/23/2022   Pain in left ankle  and joints of left foot 12/14/2021   Family history of blood clots 10/09/2021   Lateral epicondylitis, right elbow 05/25/2019   Atherosclerosis of aorta (HCC) 10/28/2018   Elevated glucose 10/28/2018   Mixed hyperlipidemia 10/28/2018   Hypothyroidism 07/20/2018   LLQ pain 12/03/2017   Pre-diabetes 06/03/2017   Rectal bleeding 06/02/2017   S/P cervical spinal fusion 03/11/2016   Bilateral hand pain 11/29/2015   Elevated C-reactive protein 11/29/2015   Elevated rheumatoid factor 11/29/2015   Raynaud's phenomenon without gangrene 11/29/2015   Bloating 09/12/2014   Dizziness 08/08/2014   Peri-menopause 08/08/2014   Chronic tension-type headache, intractable 07/13/2014   Gastroesophageal reflux disease without esophagitis 07/12/2014   Anxiety 04/18/2014   Chronic arthritis 04/18/2014   Cystitis 04/18/2014   Morbid obesity (HCC) 04/18/2014    Fibromyalgia 04/18/2014   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HCC) 04/18/2014   Osteoporosis, 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   Arthropathy 04/18/2014   Cephalalgia 04/04/2014   Numbness and tingling 04/04/2014   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 02/23/2013   Nonspecific abnormal findings on radiological and other examination of skull and head 02/23/2013   Other nonspecific abnormal result of function study of brain and central nervous system 07/01/2012   Disturbance of skin sensation 07/01/2012   Migraine without aura 07/01/2012   Abdominal pain 02/11/2012   Chronic fatigue syndrome 02/11/2012   Chronic interstitial cystitis 02/11/2012   Dyspareunia 02/11/2012   Dysuria 02/11/2012   Female stress incontinence 02/11/2012   Irritable bowel syndrome with diarrhea 02/11/2012   Nausea and vomiting 02/11/2012   Nocturia 02/11/2012   Urge incontinence 02/11/2012   Urinary urgency 02/11/2012   Lumbar spondylosis 02/04/2012   Myalgia and myositis 02/04/2012   Depression 02/04/2012   Lumbosacral spondylosis without myelopathy 02/04/2012   OBESITY, UNSPECIFIED 10/03/2010   GENERALIZED ANXIETY DISORDER 10/03/2010   Chest pain 10/03/2010    PCP: Lonie Peak PA-C  REFERRING PROVIDER: Eldred Manges, MD  REFERRING DIAG: Z98.1 (ICD-10-CM) - S/P cervical spinal fusion  THERAPY DIAG:  S/P cervical spinal fusion  Cervicalgia  Sensory disturbance  Muscle weakness (generalized)  Rationale for Evaluation and Treatment: Rehabilitation  ONSET DATE: chronic   SUBJECTIVE:                                                                                                                                                                                                         SUBJECTIVE STATEMENT: I feel discombobulated. Couldnt sleep.  Had some facial pain on L side last night (h/o neuralgia). I have a bad headache.   Hand dominance: Right  PERTINENT HISTORY:  Fibromyalgia, back pain, hip  pain ,right shoulder rotator cuff repair, right foot surgery ,Achilles tendinitis, obesity, anxiety, osteoporosis, left knee pain  Dr. Ophelia Charter: Patient requested a repeat knee injection which last year gave her good relief for a period of time. Will set up for some physical therapy for her neck for cervical spondylosis with disc base narrowing above her solid fusion. She has prominent spurs posteriorly and may have some cervical stenosis causing her upper and lower extremity symptoms.   She has had pain in her legs numbness in her feet when she stands or walks. Numbness with prolonged sitting or driving. Some back pain occasionally. Pain sometimes wakes her up at night. She states she was diagnosed with fibromyalgia.  PAIN:  Are you having pain? Yes: NPRS scale: 8/10 Pain location: head Pain description: sore Aggravating factors: activity, prolonged sitting Relieving factors: neck pillow, meds, rest  L knee pain with shot in her knee Rt achilles with shot 05/06/23: Rt achilles pain 9/10   PRECAUTIONS: Other: none   RED FLAGS: None     WEIGHT BEARING RESTRICTIONS: No  FALLS:  Has patient fallen in last 6 months? 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 I want to get rid of this dizziness too.   NEXT MD VISIT: Unknown  OBJECTIVE:   DIAGNOSTIC FINDINGS:  Impression: Fused C5-6 C6-7.  Progressive spondylosis adjacent level C4-5  with prominent posterior osteophytes .   PATIENT SURVEYS:  FOTO 42%  COGNITION: Overall cognitive status: Within functional limits for tasks assessed  SENSATION: WFL UE and  LE numb and tingly daily, off and on.  POSTURE: rounded shoulders, forward head, and increased thoracic kyphosis  PALPATION: Pain with palpation to right upper trap and suboccipitals posterior cervicals   CERVICAL ROM:   Active ROM A/PROM (deg) eval  Flexion 46  Extension 55  Right lateral flexion 45  Left lateral flexion 45  Right rotation WFL  Left rotation Pain on L , WFL   (Blank rows = not tested)  UPPER EXTREMITY ROM: Eval: WFLs   Active ROM Right eval Left eval  Shoulder flexion    Shoulder extension    Shoulder abduction    Shoulder adduction    Shoulder extension    Shoulder internal rotation    Shoulder external rotation    Elbow flexion    Elbow extension    Wrist flexion    Wrist extension    Wrist ulnar deviation    Wrist radial deviation    Wrist pronation    Wrist supination     (Blank rows = not tested)  UPPER EXTREMITY MMT:  MMT Right eval Left eval  Shoulder flexion 4- 4  Shoulder extension    Shoulder abduction 3+ 4  Shoulder adduction    Shoulder extension    Shoulder internal rotation    Shoulder external rotation    Middle trapezius    Lower trapezius    Elbow flexion 4 4  Elbow extension 4 4  Wrist flexion    Wrist extension    Wrist ulnar deviation  Wrist radial deviation    Wrist pronation    Wrist supination    Grip strength 40 42   (Blank rows = not tested)  CERVICAL SPECIAL TESTS:  Neck flexor muscle endurance test: NT on eval  and Distraction test: no change    LOWER EXTREMITY MMT:  WFLs  MMT Right eval Left eval  Hip flexion    Hip extension    Hip abduction    Hip adduction    Hip internal rotation    Hip external rotation    Knee flexion    Knee extension    Ankle dorsiflexion    Ankle plantarflexion    Ankle inversion    Ankle eversion     (Blank rows = not tested)    FUNCTIONAL TESTS:  5 times sit to stand: 11 sec   TODAY'S TREATMENT:    [facility] Adult PT Treatment:                                                 DATE: 05/07/23 Therapeutic Exercise: Nustep L5 UE and LE for 8 min "I like this" Supine narrow grip red band overhead lift Horiz. Pull x 15  Diagonal x 10  Sit to stand x 15 with 10 lbs KB 2nd set added a partial chest press  Calf stretch at the wall for each LE x 30 sec x 3 Wall for posture: goal post , wall angels  x 10   [facility] Adult PT Treatment:                                                DATE: 05/06/23 Therapeutic Exercise: Supine LTR with head turns  Supine chin tuck x 10 added scapular retraction x 10  Supine abdominal draw -in x 10  Added march  Added UE arcs and combo UE/LE mod cues increased time  Horizontal abduction red band  x 10  Seated on dynadisc core balance for the following  Overhead arms red band  Upper trunk rotation with Pilates circle x 5  Alt. Knee extension on disc Alt. UE and LE lift x 10  Eyes closed Red band UE ER/IR attached  Cervical AROM, rotation, sidebending  BERG BALANCE  Sitting to Standing: Numbers; 0-4: 4 2. Standing unsupported: Numbers; 0-4: 4 3. Sitting unsupported: Numbers; 0-4: 4 4 Standing to Sitting: Numbers; 0-4: 4 5. Transfers: Numbers; 0-4: 4 6. Standing with eyes closed: Numbers; 0-4: 4 7. Standing with feet together: Numbers; 0-4: 4 8. Reaching forward with outstretched arm: Numbers; 0-4: 4 9. Retrieving object from the floor: Numbers; 0-4: 4 10. Turning to look behind: Numbers; 0-4: 4 11. Turning 360 degrees: Numbers; 0-4: 4 12. Place alternate foot on stool: Numbers; 0-4: 4 13. Standing with one foot in front: Numbers; 0-4: 4 14. Standing on one foot: Numbers; 0-4: 4  Total Score: 56/56  DATE: 04/25/23   PATIENT EDUCATION:  Education details: PT, POC, balance, causes of dizziness, role of activity with wgt loss  Person educated: Patient Education method: Explanation Education  comprehension: verbalized understanding  HOME EXERCISE PROGRAM: Access Code: [facility] URL: [email or web address] Date: 05/06/2023 Prepared by: [name]  Exercises - Standing Cervical Rotation AROM with Overpressure  - 1 x daily - 7 x weekly - 2 sets - 10 reps - 10-15 hold - Seated Cervical Sidebending Stretch  - 1 x daily - 7 x weekly - 2 sets - 10 reps - 10-15 hold - Seated Scapular Retraction  - 1 x daily - 7 x weekly - 2 sets - 10 reps - 5 hold - Seated Cervical Retraction  - 1 x daily - 7 x weekly - 2 sets - 10 reps - 5 hold - Seated Marching with Opposite Shoulder Flexion  - 1 x daily - 7 x weekly - 2 sets - 10 reps - 5 hold - Standing Shoulder Horizontal Abduction with Resistance  - 1 x daily - 7 x weekly - 2 sets - 10 reps - 5 hold -wall angels, goal post -Calf stretch    ASSESSMENT:  CLINICAL IMPRESSION: Patient able to tolerate exercise well despite headache.  She does need extra time to process directions and form with exercise.  She is appreciative of the exercises and would like to consider getting equipment for home use.  She will continue to benefit from skilled PT to further strengthen UEs and provide core stability for functional mobility.    OBJECTIVE IMPAIRMENTS: decreased balance, decreased coordination, decreased endurance, decreased mobility, difficulty walking, decreased ROM, decreased strength, dizziness, hypomobility, increased fascial restrictions, impaired flexibility, impaired sensation, impaired UE functional use, postural dysfunction, obesity, and pain.   ACTIVITY LIMITATIONS: carrying, lifting, bending, sitting, standing, squatting, sleeping, transfers, bed mobility, reach over head, and locomotion level  PARTICIPATION LIMITATIONS: meal prep, cleaning, laundry, interpersonal relationship, driving, shopping, and community activity  PERSONAL FACTORS: Past/current experiences, Social background, Time since onset of  injury/illness/exacerbation, and 3+ comorbidities: fibro, Rt ankle pain, L knee pain  are also affecting patient's functional outcome.   REHAB POTENTIAL: Good  CLINICAL DECISION MAKING: Evolving/moderate complexity  EVALUATION COMPLEXITY: Moderate   GOALS: Goals reviewed with patient? Yes SHORT TERM GOALS: Target date: 05/23/2023     Patient will be independent with home program for posture strength and mobility.  Baseline: up to date  Goal status: ongoing   2.  Patient will be able to report 3 self-care strategies for pain relief Baseline:  Goal status: ongoing   3.  Patient will be able to complete functional testing /balance screen and have goals set Baseline:  Goal status:MET    LONG TERM GOALS: Target date: 06/06/2023   Patient will improve Foto score to 50% or better to demonstrate improved functional mobility Baseline:  Goal status:ongoing   2.  Pt will be able to walk on the treadmill 10 minutes every day to build the habit of exercise Baseline: LE numbness increases  Goal status:ongoing   3.  Patient will be able to report sensory symptoms in upper body improved by 25% Baseline: daily, moderate throughout the day  Goal status: ongoing   4.  Patient will be able to complete light housework with no increase in pain Baseline: moderate pain in neck and UEs  Goal status: ongoing   5.  Patient will be able to fully perform active range of motion in cervical spine without increased pain Baseline: Pain with  left rotation and sidebending right and left Goal status: ongoing   6.  Patient will be able to lift 10 pounds from the floor to shoulder height x 10 to demo improved functional strength Baseline:  Goal status: ongoing    PLAN:  PT FREQUENCY: 2x/week  PT DURATION: 8 weeks  PLANNED INTERVENTIONS: Therapeutic exercises, Therapeutic activity, Neuromuscular re-education, Balance training, Gait training, Patient/Family education, Self Care, Joint mobilization,  Aquatic Therapy, Dry Needling, Spinal mobilization, Cryotherapy, Moist heat, Taping, Manual therapy, and Re-evaluation  PLAN FOR NEXT SESSION: add to HEP as needed. Gentle cervical ROM, shoulder ROM and strength, posture, modalities as needed   ,, PT 05/07/2023, 12:11 PM   [name], PT 05/07/23 12:11 PM Phone: [phone] Fax: [phone]

## 2023-05-07 ENCOUNTER — Ambulatory Visit: Payer: Medicare Other | Admitting: Physical Therapy

## 2023-05-07 DIAGNOSIS — M6281 Muscle weakness (generalized): Secondary | ICD-10-CM

## 2023-05-07 DIAGNOSIS — Z981 Arthrodesis status: Secondary | ICD-10-CM

## 2023-05-07 DIAGNOSIS — R209 Unspecified disturbances of skin sensation: Secondary | ICD-10-CM | POA: Diagnosis not present

## 2023-05-07 DIAGNOSIS — M542 Cervicalgia: Secondary | ICD-10-CM

## 2023-05-12 ENCOUNTER — Ambulatory Visit: Payer: Medicare Other | Admitting: Physical Therapy

## 2023-05-13 NOTE — Progress Notes (Unsigned)
Subjective:    Patient ID: Karina Greer, female    DOB: October 22, 1965, 57 y.o.   MRN: [id number]  HPI: Karina Greer is a 57 y.o. female who returns for follow up appointment for chronic pain and medication refill. She states her pain is located in her neck, lower back pain and right foot pain. She rates her pain 6. Her current exercise regime is attending physical therapy weekly, walking and performing stretching exercises.  Ms. Dahlin Morphine equivalent is 30.00 MME.   Last UDS was Performed on 11/29/2022, it was consistent.    Pain Inventory Average Pain 9 Pain Right Now 6 My pain is constant, sharp, burning, stabbing, tingling, and aching  In the last 24 hours, has pain interfered with the following? General activity 6 Relation with others 6 Enjoyment of life 6 What TIME of day is your pain at its worst? morning , daytime, evening, and night Sleep (in general) Poor  Pain is worse with: walking, bending, sitting, inactivity, standing, unsure, and some activites Pain improves with: rest, heat/ice, therapy/exercise, pacing activities, medication, TENS, and injections Relief from Meds: 7  Family History  Problem Relation Age of Onset   Hypertension Mother    Diabetes Father    Brain cancer Father    Clotting disorder Father        blood clots   Fibromyalgia Sister    Suicidality Sister    Hypertension Other        Cancer, Cerebrovascular disease run on mother side of family   Social History   Socioeconomic History   Marital status: Divorced    Spouse name: Not on file   Number of children: 3   Years of education: HS   Highest education level: 12th grade  Occupational History   Occupation: Designer, industrial/product: UNEMPLOYED    Comment: Disability  Tobacco Use   Smoking status: Never   Smokeless tobacco: Never  Vaping Use   Vaping status: Never Used  Substance and Sexual Activity   Alcohol use: No   Drug use: No   Sexual activity: Not on file  Other Topics  Concern   Not on file  Social History Narrative   Patient is right-handed. She avoids caffeine. She has recently been using the treadmill.   Social Determinants of Health   Financial Resource Strain: Not on file  Food Insecurity: Not on file  Transportation Needs: Not on file  Physical Activity: Not on file  Stress: Not on file  Social Connections: Not on file   Past Surgical History:  Procedure Laterality Date   ACHILLES TENDON SURGERY Right 3/16   ANTERIOR CERVICAL DECOMP/DISCECTOMY FUSION N/A 03/11/2016   Procedure: C5-6, C6-7 Anterior Cervical Discectomy and Fusion, Allograft, Plate;  Surgeon: Eldred Manges, MD;  Location: [facility] OR;  Service: Orthopedics;  Laterality: N/A;   CHOLECYSTECTOMY  2014   COLONOSCOPY WITH PROPOFOL N/A 08/25/2017   Procedure: COLONOSCOPY WITH PROPOFOL;  Surgeon: Scot Jun, MD;  Location: [facility] ENDOSCOPY;  Service: Endoscopy;  Laterality: N/A;   CYSTO WITH HYDRODISTENSION N/A 01/26/2014   Procedure: CYSTOSCOPY/HYDRODISTENSION;  Surgeon: [name], MD;  Location: [facility];  Service: Urology;  Laterality: N/A;   CYSTO/  URETHRAL DILATION/  HYDRODISTENTION/   INSTILLATION THERAPY  07-16-2010//   12-30-2007//   10-27-2006   ELBOW SURGERY Right    EXERCISE TOLERENCE TEST  10-12-2010   NEGATIVE  ADEQUATE ETT/  NO ISCHEMIA OR EVIDENCE HIGH GRADE OBSTRUCTIVE CAD/  NO  FURTHER TEST NEEDED   HYSTEROSCOPY W/  NOVASURE ENDOMETRIAL ABLATION  2014   LAPAROSCOPIC OVARIAN CYST BX  2005   AND  URETEROSCOPIC LASER LITHO  STONE EXTRACTION   SHOULDER OPEN ROTATOR CUFF REPAIR Right 2004   TONSILLECTOMY     TRANSTHORACIC ECHOCARDIOGRAM  06-07-2006   normal study/  ef 60-65%   TUBAL LIGATION Bilateral 1995   Past Surgical History:  Procedure Laterality Date   ACHILLES TENDON SURGERY Right 3/16   ANTERIOR CERVICAL DECOMP/DISCECTOMY FUSION N/A 03/11/2016   Procedure: C5-6, C6-7 Anterior Cervical Discectomy and Fusion, Allograft, Plate;  Surgeon: Eldred Manges, MD;  Location: [facility] OR;  Service: Orthopedics;  Laterality: N/A;   CHOLECYSTECTOMY  2014   COLONOSCOPY WITH PROPOFOL N/A 08/25/2017   Procedure: COLONOSCOPY WITH PROPOFOL;  Surgeon: Scot Jun, MD;  Location: [facility] ENDOSCOPY;  Service: Endoscopy;  Laterality: N/A;   CYSTO WITH HYDRODISTENSION N/A 01/26/2014   Procedure: CYSTOSCOPY/HYDRODISTENSION;  Surgeon: [name], MD;  Location: [facility];  Service: Urology;  Laterality: N/A;   CYSTO/  URETHRAL DILATION/  HYDRODISTENTION/   INSTILLATION THERAPY  07-16-2010//   12-30-2007//   10-27-2006   ELBOW SURGERY Right    EXERCISE TOLERENCE TEST  10-12-2010   NEGATIVE  ADEQUATE ETT/  NO ISCHEMIA OR EVIDENCE HIGH GRADE OBSTRUCTIVE CAD/  NO FURTHER TEST NEEDED   HYSTEROSCOPY W/  NOVASURE ENDOMETRIAL ABLATION  2014   LAPAROSCOPIC OVARIAN CYST BX  2005   AND  URETEROSCOPIC LASER LITHO  STONE EXTRACTION   SHOULDER OPEN ROTATOR CUFF REPAIR Right 2004   TONSILLECTOMY     TRANSTHORACIC ECHOCARDIOGRAM  06-07-2006   normal study/  ef 60-65%   TUBAL LIGATION Bilateral 1995   Past Medical History:  Diagnosis Date   Anxiety    Anxiety disorder    Arthritis    Blepharitis    Chronic low back pain    Chronic pain    Depression    Diabetes mellitus without complication (HCC)    Dyslipidemia    Family history of blood clots    Fibromyalgia    GERD (gastroesophageal reflux disease)    Headache    hx migraines   History of kidney stones    History of panic attacks    History of renal calculi    IBS (irritable bowel syndrome)    Interstitial cystitis    Lupus (HCC)    tested positive for antibodies for lupus. dr to do further tests   Menorrhagia    OSA on CPAP    not used cpap for several weeks   Pneumonia    hx   PONV (postoperative nausea and vomiting)    RLS (restless legs syndrome)    Rosacea    Seasonal asthma    SI (sacroiliac) joint dysfunction    SUI (stress urinary incontinence, female)    UTI (lower  urinary tract infection)    hx   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 02/23/2013   There were no vitals taken for this visit.  Opioid Risk Score:   Fall Risk Score:  `1  Depression screen [facility] 2/9     02/24/2023    8:38 AM 01/24/2023    9:02 AM 12/27/2022    8:42 AM 11/29/2022   10:37 AM 10/04/2022    8:29 AM 09/27/2022    9:50 AM 09/03/2022   12:46 PM  Depression screen PHQ 2/9  Decreased Interest 0 0 0 0  0 0  Down, Depressed, Hopeless  0 0 0 0  0 0  PHQ - 2 Score 0 0 0 0  0 0  Altered sleeping         Tired, decreased energy         Change in appetite         Feeling bad or failure about yourself          Trouble concentrating         Moving slowly or fidgety/restless         Suicidal thoughts         PHQ-9 Score            Information is confidential and restricted. Go to Review Flowsheets to unlock data.    Review of Systems  Musculoskeletal:  Positive for arthralgias and back pain.       Pain all over the body.  All other systems reviewed and are negative.      Objective:   Physical Exam Vitals and nursing note reviewed.  Constitutional:      Appearance: Normal appearance.  Neck:     Comments: Cervical Paraspinal Tenderness: C-5-C-6 Cardiovascular:     Rate and Rhythm: Normal rate and regular rhythm.     Pulses: Normal pulses.     Heart sounds: Normal heart sounds.  Pulmonary:     Effort: Pulmonary effort is normal.     Breath sounds: Normal breath sounds.  Musculoskeletal:     Cervical back: Normal range of motion and neck supple.     Comments: Normal Muscle Bulk and Muscle Testing Reveals:  Upper Extremities: Full ROM and Muscle Strength 5/5 Bilateral AC Joint Tenderness Thoracic Paraspinal Tenderness: T-1-T-3 Lumbar Paraspinal Tenderness: L-4-L-5 Lower Extremities: Full ROM and Muscle Strength 5/5 Arises from Chair slowly Narrow Based  Gait     Skin:    General: Skin is warm and dry.  Neurological:     Mental Status: She is alert and oriented to  person, place, and time.  Psychiatric:        Mood and Affect: Mood normal.        Behavior: Behavior normal.         Assessment & Plan:  1. Lumbago/ Lumbar Spondylosis/ Right  Lumbar Radiculitis: Continue to Monitor. 08/21/ 2024 Refilled: HYDROcodone 10/325mg  one tablet every 8 hours as needed #90.  We will continue the opioid monitoring program, this consists of regular clinic visits, examinations, urine drug screen, pill counts as well as use of [place] Controlled Substance Reporting system. A 12 month History has been reviewed on the [place] Controlled Substance Reporting System on 05/14/2023. 2. Fibromyalgia. Continue Current exercise Regime. 05/14/2023 3. Anxiety and depression:Psychiatry Following: Counselor Jessica  at [facility] . Continue Counseling at [facility]. 05/14/2023 4. Migraines/ Frontal Headache: On Maxalt. Keep a headache Journal. Schedule an appointment with Dr Carlis Abbott to discuss Migraine treatment, she verbalizes understanding. Neurology Following. 05/14/2023 5. OSA : PCP Following. Continue to Monitor. 05/14/2023 6. Obesity: Dr Carlis Abbott Following: Continue  Healthy Diet Regimen and Continue HEP as Tolerated. 05/14/2023 7. Status Post Cervical Spinal Fusion: C5C6- C-6- C-7 Anterior Cervical Discectomy Fusion and Allograft Plate: Dr. Ophelia Charter Following. 05/14/2023 8. Cervicalgia/ Cervical Radiculitis/ S/P Cervical Spinal Fusion:  Continue to Monitor Dr. Ophelia Charter Following. 05/14/2023 9. Muscle Spasm: Continue Tizanidine as needed.05/14/2023 10.Hereditary and Idiopathic Peripheral Neuropathy Continue with Tens Unit. 05/14/2023. 11.Left Greater Trochanteric Bursitis: . Continue to Alternate Ice and heat Therapy. Continue to Monitor. 05/14/2023. 12. Chronic Bilateral Knee Pain:  No Complaints Today. Ortho Following. Continue HEP as Tolerated. Continue to Monitor. 05/14/2023 13. Lateral Epicondylitis of Right Elbow: No complaints today.  Ortho Following.  S/P   06/26/2020 Right lateral epicondyle debridement, drilling and repair  By Dr Ophelia Charter. 14. Neuralgia/ Right Facial Droop: Neurology following. Continue to monitor. 05/14/2023. 15. Polyarthralgia: No complaints today. Continue HEP as tolerated. Continue to monitor. 05/14/2023  16. Paresthesia of Right Lower Extremity: No complaints today. Occasionally: Continue current medication regimen. Continue to monitor. 05/14/2023 17 . Right Foot Pain/ Chronic Right Ankle Pain:  Podiatry following. Dr Regal.We will continue to Monitor. 05/14/2023 .    F/U in 1 month .

## 2023-05-13 NOTE — Progress Notes (Deleted)
   Subjective:    Patient ID: Karina Greer, female    DOB: [birth year], 57 y.o.   MRN: [id number]  HPI   .cpr Review of Systems     Objective:   Physical Exam        Assessment & Plan:

## 2023-05-13 NOTE — Therapy (Unsigned)
OUTPATIENT PHYSICAL THERAPY TREATMENT   Patient Name: Karina Greer MRN: [id number] DOB:10-03-1965, 57 y.o., female Today's Date: 05/14/2023  END OF SESSION:  PT End of Session - 05/14/23 1020     Visit Number 4    Number of Visits 16    Date for PT Re-Evaluation 06/20/23    Authorization Type MCR, MCD [place]    PT Start Time 1018    PT Stop Time 1100    PT Time Calculation (min) 42 min    Activity Tolerance Patient tolerated treatment well    Behavior During Therapy WFL for tasks assessed/performed                Past Medical History:  Diagnosis Date   Anxiety    Anxiety disorder    Arthritis    Blepharitis    Chronic low back pain    Chronic pain    Depression    Diabetes mellitus without complication (HCC)    Dyslipidemia    Family history of blood clots    Fibromyalgia    GERD (gastroesophageal reflux disease)    Headache    hx migraines   History of kidney stones    History of panic attacks    History of renal calculi    IBS (irritable bowel syndrome)    Interstitial cystitis    Lupus (HCC)    tested positive for antibodies for lupus. 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   Past Surgical History:  Procedure Laterality Date   ACHILLES TENDON SURGERY Right 3/16   ANTERIOR CERVICAL DECOMP/DISCECTOMY FUSION N/A 03/11/2016   Procedure: C5-6, C6-7 Anterior Cervical Discectomy and Fusion, Allograft, Plate;  Surgeon: Eldred Manges, MD;  Location: [facility] OR;  Service: Orthopedics;  Laterality: N/A;   CHOLECYSTECTOMY  2014   COLONOSCOPY WITH PROPOFOL N/A 08/25/2017   Procedure: COLONOSCOPY WITH PROPOFOL;   Surgeon: Scot Jun, MD;  Location: [facility] ENDOSCOPY;  Service: Endoscopy;  Laterality: N/A;   CYSTO WITH HYDRODISTENSION N/A 01/26/2014   Procedure: CYSTOSCOPY/HYDRODISTENSION;  Surgeon: [name], MD;  Location: [facility];  Service: Urology;  Laterality: N/A;   CYSTO/  URETHRAL DILATION/  HYDRODISTENTION/   INSTILLATION THERAPY  07-16-2010//   12-30-2007//   10-27-2006   ELBOW SURGERY Right    EXERCISE TOLERENCE TEST  10-12-2010   NEGATIVE  ADEQUATE ETT/  NO ISCHEMIA OR EVIDENCE HIGH GRADE OBSTRUCTIVE CAD/  NO FURTHER TEST NEEDED   HYSTEROSCOPY W/  NOVASURE ENDOMETRIAL ABLATION  2014   LAPAROSCOPIC OVARIAN CYST BX  2005   AND  URETEROSCOPIC LASER LITHO  STONE EXTRACTION   SHOULDER OPEN ROTATOR CUFF REPAIR Right 2004   TONSILLECTOMY     TRANSTHORACIC ECHOCARDIOGRAM  06-07-2006   normal study/  ef 60-65%   TUBAL LIGATION Bilateral 1995   Patient Active Problem List   Diagnosis Date Noted   Pain in left knee 04/22/2023   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 [facility]) 01/09/2023   Erythrocytosis 01/23/2022   Pain in left  ankle and joints of left foot 12/14/2021   Family history of blood clots 10/09/2021   Lateral epicondylitis, right elbow 05/25/2019   Atherosclerosis of aorta (HCC) 10/28/2018   Elevated glucose 10/28/2018   Mixed hyperlipidemia 10/28/2018   Hypothyroidism 07/20/2018   LLQ pain 12/03/2017   Pre-diabetes 06/03/2017   Rectal bleeding 06/02/2017   S/P cervical spinal fusion 03/11/2016   Bilateral hand pain 11/29/2015   Elevated C-reactive protein 11/29/2015   Elevated rheumatoid factor 11/29/2015   Raynaud's phenomenon without gangrene 11/29/2015   Bloating 09/12/2014   Dizziness 08/08/2014   Peri-menopause 08/08/2014   Chronic tension-type headache, intractable 07/13/2014   Gastroesophageal reflux disease without esophagitis 07/12/2014   Anxiety 04/18/2014   Chronic arthritis 04/18/2014   Cystitis 04/18/2014   Morbid obesity (HCC)  04/18/2014   Fibromyalgia 04/18/2014   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HCC) 04/18/2014   Osteoporosis, 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   Arthropathy 04/18/2014   Cephalalgia 04/04/2014   Numbness and tingling 04/04/2014   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 02/23/2013   Nonspecific abnormal findings on radiological and other examination of skull and head 02/23/2013   Other nonspecific abnormal result of function study of brain and central nervous system 07/01/2012   Disturbance of skin sensation 07/01/2012   Migraine without aura 07/01/2012   Abdominal pain 02/11/2012   Chronic fatigue syndrome 02/11/2012   Chronic interstitial cystitis 02/11/2012   Dyspareunia 02/11/2012   Dysuria 02/11/2012   Female stress incontinence 02/11/2012   Irritable bowel syndrome with diarrhea 02/11/2012   Nausea and vomiting 02/11/2012   Nocturia 02/11/2012   Urge incontinence 02/11/2012   Urinary urgency 02/11/2012   Lumbar spondylosis 02/04/2012   Myalgia and myositis 02/04/2012   Depression 02/04/2012   Lumbosacral spondylosis without myelopathy 02/04/2012   OBESITY, UNSPECIFIED 10/03/2010   GENERALIZED ANXIETY DISORDER 10/03/2010   Chest pain 10/03/2010    PCP: Lonie Peak PA-C  REFERRING PROVIDER: Eldred Manges, MD  REFERRING DIAG: Z98.1 (ICD-10-CM) - S/P cervical spinal fusion  THERAPY DIAG:  S/P cervical spinal fusion  Cervicalgia  Sensory disturbance  Muscle weakness (generalized)  Rationale for Evaluation and Treatment: Rehabilitation  ONSET DATE: chronic   SUBJECTIVE:                                                                                                                                                                                                         SUBJECTIVE STATEMENT: I had some bad headaches, the past few days. My foot is killing me.    Hand dominance: Right  PERTINENT HISTORY:  Fibromyalgia, back  pain, hip pain ,right shoulder rotator cuff  repair, right foot surgery ,Achilles tendinitis, obesity, anxiety, osteoporosis, left knee pain  Dr. Ophelia Charter: Patient requested a repeat knee injection which last year gave her good relief for a period of time. Will set up for some physical therapy for her neck for cervical spondylosis with disc base narrowing above her solid fusion. She has prominent spurs posteriorly and may have some cervical stenosis causing her upper and lower extremity symptoms.   She has had pain in her legs numbness in her feet when she stands or walks. Numbness with prolonged sitting or driving. Some back pain occasionally. Pain sometimes wakes her up at night. She states she was diagnosed with fibromyalgia.  PAIN:  Are you having pain? Yes: NPRS scale: 8/10 Pain location: head Pain description: sore Aggravating factors: activity, prolonged sitting Relieving factors: neck pillow, meds, rest  L knee pain with shot in her knee Rt achilles with shot 05/14/23: Rt achilles pain 8/10   PRECAUTIONS: Other: none   RED FLAGS: None     WEIGHT BEARING RESTRICTIONS: No  FALLS:  Has patient fallen in last 6 months? 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 I want to get rid of this dizziness too.   NEXT MD VISIT: Unknown  OBJECTIVE:   DIAGNOSTIC FINDINGS:  Impression: Fused C5-6 C6-7.  Progressive spondylosis adjacent level C4-5  with prominent posterior osteophytes .   PATIENT SURVEYS:  FOTO 42%  COGNITION: Overall cognitive status: Within functional limits for tasks assessed  SENSATION: WFL UE and LE numb and tingly daily, off and  on.  POSTURE: rounded shoulders, forward head, and increased thoracic kyphosis  PALPATION: Pain with palpation to right upper trap and suboccipitals posterior cervicals   CERVICAL ROM:   Active ROM A/PROM (deg) eval  Flexion 46  Extension 55  Right lateral flexion 45  Left lateral flexion 45  Right rotation WFL  Left rotation Pain on L , WFL   (Blank rows = not tested)  UPPER EXTREMITY ROM: Eval: WFLs   Active ROM Right eval Left eval  Shoulder flexion    Shoulder extension    Shoulder abduction    Shoulder adduction    Shoulder extension    Shoulder internal rotation    Shoulder external rotation    Elbow flexion    Elbow extension    Wrist flexion    Wrist extension    Wrist ulnar deviation    Wrist radial deviation    Wrist pronation    Wrist supination     (Blank rows = not tested)  UPPER EXTREMITY MMT:  MMT Right eval Left eval  Shoulder flexion 4- 4  Shoulder extension    Shoulder abduction 3+ 4  Shoulder adduction    Shoulder extension    Shoulder internal rotation    Shoulder external rotation    Middle trapezius    Lower trapezius    Elbow flexion 4 4  Elbow extension 4 4  Wrist flexion    Wrist extension    Wrist ulnar deviation    Wrist radial deviation  Wrist pronation    Wrist supination    Grip strength 40 42   (Blank rows = not tested)  CERVICAL SPECIAL TESTS:  Neck flexor muscle endurance test: NT on eval  and Distraction test: no change    LOWER EXTREMITY MMT:  WFLs  MMT Right eval Left eval  Hip flexion    Hip extension    Hip abduction    Hip adduction    Hip internal rotation    Hip external rotation    Knee flexion    Knee extension    Ankle dorsiflexion    Ankle plantarflexion    Ankle inversion    Ankle eversion     (Blank rows = not tested)    FUNCTIONAL TESTS:  5 times sit to stand: 11 sec   TODAY'S TREATMENT:   [facility] Adult PT Treatment:                                                DATE:  05/13/23 Therapeutic Exercise: NuStep L6 UE and LE for 6 min  Slantboard 1 min x 2  Single leg balance for posture and proprioception  Heel raise x 15 , double and single  Standing upper back at wall: isometric shoulder retraction/extension Wall goal post and angels  Green band horizontal abd x 15  Open book (mat) x 5  Hip abduction x 15  Clam x 15  Seated neck stretches, review (rotation and sidebending)  Self Care: Achilles stretching     [facility] Adult PT Treatment:                                                DATE: 05/07/23 Therapeutic Exercise: Nustep L5 UE and LE for 8 min "I like this" Supine narrow grip red band overhead lift Horiz. Pull x 15  Diagonal x 10  Sit to stand x 15 with 10 lbs KB 2nd set added a partial chest press  Calf stretch at the wall for each LE x 30 sec x 3 Wall for posture: goal post , wall angels  x 10   [facility] Adult PT Treatment:                                                DATE: 05/06/23 Therapeutic Exercise: Supine LTR with head turns  Supine chin tuck x 10 added scapular retraction x 10  Supine abdominal draw -in x 10  Added march  Added UE arcs and combo UE/LE mod cues increased time  Horizontal abduction red band  x 10  Seated on dynadisc core balance for the following  Overhead arms red band  Upper trunk rotation with Pilates circle x 5  Alt. Knee extension on disc Alt. UE and LE lift x 10  Eyes closed Red band UE ER/IR attached  Cervical AROM, rotation, sidebending  BERG BALANCE  Sitting to Standing: Numbers; 0-4: 4 2. Standing unsupported: Numbers; 0-4: 4 3. Sitting unsupported: Numbers; 0-4: 4 4 Standing to Sitting: Numbers; 0-4: 4 5. Transfers: Numbers; 0-4: 4 6. Standing with eyes closed: Numbers; 0-4: 4 7. Standing  with feet together: Numbers; 0-4: 4 8. Reaching forward with outstretched arm: Numbers; 0-4: 4 9. Retrieving object from the floor: Numbers; 0-4: 4 10. Turning to look behind: Numbers; 0-4: 4 11. Turning 360  degrees: Numbers; 0-4: 4 12. Place alternate foot on stool: Numbers; 0-4: 4 13. Standing with one foot in front: Numbers; 0-4: 4 14. Standing on one foot: Numbers; 0-4: 4  Total Score: 56/56                                                                                                                             DATE: 04/25/23   PATIENT EDUCATION:  Education details: PT, POC, balance, causes of dizziness, role of activity with wgt loss  Person educated: Patient Education method: Explanation Education comprehension: verbalized understanding  HOME EXERCISE PROGRAM: Access Code: [facility] URL: [email or web address] Date: 05/14/2023 Prepared by: [name]  Exercises - Standing Cervical Rotation AROM with Overpressure  - 1 x daily - 7 x weekly - 2 sets - 10 reps - 10-15 hold - Seated Cervical Sidebending Stretch  - 1 x daily - 7 x weekly - 2 sets - 10 reps - 10-15 hold - Seated Scapular Retraction  - 1 x daily - 7 x weekly - 2 sets - 10 reps - 5 hold - Seated Cervical Retraction  - 1 x daily - 7 x weekly - 2 sets - 10 reps - 5 hold - Seated Marching with Opposite Shoulder Flexion  - 1 x daily - 7 x weekly - 2 sets - 10 reps - 5 hold - Standing Shoulder Horizontal Abduction with Resistance  - 1 x daily - 7 x weekly - 2 sets - 10 reps - 5 hold - Wall Angels  - 1 x daily - 7 x weekly - 2 sets - 10 reps - 2-3 hold - Gastroc Stretch on Wall  - 1 x daily - 7 x weekly - 1 sets - 5 reps - 30 hold - Standing Bilateral Heel Raise on Step  - 1 x daily - 7 x weekly - 2 sets - 10 reps - Single Leg Stance with Support  - 1 x daily - 7 x weekly - 1 sets - 3-5 reps - 30 hold  ASSESSMENT:  CLINICAL IMPRESSION: Pt continues to have pain in neck and mostly, her foot due to Achilles tendinitis.  PT was able to integrate standing balance and postures into stretching for her ankle/foot.  She has had multiple injection and no PT for her ankle.  She will continue to  benefit from skilled  physical therapy for multiple areas of pain and to improve confidence with mobility.   OBJECTIVE IMPAIRMENTS: decreased balance, decreased coordination, decreased endurance, decreased mobility, difficulty walking, decreased ROM, decreased strength, dizziness, hypomobility, increased fascial restrictions, impaired flexibility, impaired sensation, impaired UE functional use, postural dysfunction, obesity, and pain.   ACTIVITY LIMITATIONS: carrying, lifting, bending, sitting, standing, squatting, sleeping, transfers, bed mobility, reach over head, and  locomotion level  PARTICIPATION LIMITATIONS: meal prep, cleaning, laundry, interpersonal relationship, driving, shopping, and community activity  PERSONAL FACTORS: Past/current experiences, Social background, Time since onset of injury/illness/exacerbation, and 3+ comorbidities: fibro, Rt ankle pain, L knee pain  are also affecting patient's functional outcome.   REHAB POTENTIAL: Good  CLINICAL DECISION MAKING: Evolving/moderate complexity  EVALUATION COMPLEXITY: Moderate   GOALS: Goals reviewed with patient? Yes SHORT TERM GOALS: Target date: 05/23/2023     Patient will be independent with home program for posture strength and mobility.  Baseline: up to date  Goal status: ongoing   2.  Patient will be able to report 3 self-care strategies for pain relief Baseline:  Goal status: ongoing   3.  Patient will be able to complete functional testing /balance screen and have goals set Baseline:  Goal status:MET    LONG TERM GOALS: Target date: 06/06/2023   Patient will improve Foto score to 50% or better to demonstrate improved functional mobility Baseline:  Goal status:ongoing   2.  Pt will be able to walk on the treadmill 10 minutes every day to build the habit of exercise Baseline: LE numbness increases  Goal status:ongoing   3.  Patient will be able to report sensory symptoms in upper body improved by 25% Baseline: daily, moderate  throughout the day  Goal status: ongoing   4.  Patient will be able to complete light housework with no increase in pain Baseline: moderate pain in neck and UEs  Goal status: ongoing   5.  Patient will be able to fully perform active range of motion in cervical spine without increased pain Baseline: Pain with left rotation and sidebending right and left Goal status: ongoing   6.  Patient will be able to lift 10 pounds from the floor to shoulder height x 10 to demo improved functional strength Baseline:  Goal status: ongoing    PLAN:  PT FREQUENCY: 2x/week  PT DURATION: 8 weeks  PLANNED INTERVENTIONS: Therapeutic exercises, Therapeutic activity, Neuromuscular re-education, Balance training, Gait training, Patient/Family education, Self Care, Joint mobilization, Aquatic Therapy, Dry Needling, Spinal mobilization, Cryotherapy, Moist heat, Taping, Manual therapy, and Re-evaluation  PLAN FOR NEXT SESSION:  Gentle cervical ROM, shoulder ROM and strength, posture, modalities as needed.  Standing balance, Achilles   Elim Economou, PT 05/14/2023, 10:24 AM   [name], PT 05/14/23 10:24 AM Phone: [phone] Fax: [phone]

## 2023-05-14 ENCOUNTER — Ambulatory Visit: Payer: Medicare Other | Admitting: Physical Therapy

## 2023-05-14 ENCOUNTER — Encounter: Payer: Self-pay | Admitting: Registered Nurse

## 2023-05-14 ENCOUNTER — Other Ambulatory Visit: Payer: Self-pay

## 2023-05-14 ENCOUNTER — Encounter: Payer: Self-pay | Admitting: Physical Therapy

## 2023-05-14 ENCOUNTER — Encounter: Payer: Medicare Other | Attending: Physical Medicine & Rehabilitation | Admitting: Registered Nurse

## 2023-05-14 VITALS — BP 139/86 | HR 60 | Ht 63.0 in | Wt 223.0 lb

## 2023-05-14 DIAGNOSIS — M542 Cervicalgia: Secondary | ICD-10-CM

## 2023-05-14 DIAGNOSIS — M47816 Spondylosis without myelopathy or radiculopathy, lumbar region: Secondary | ICD-10-CM | POA: Insufficient documentation

## 2023-05-14 DIAGNOSIS — M797 Fibromyalgia: Secondary | ICD-10-CM | POA: Diagnosis not present

## 2023-05-14 DIAGNOSIS — Z5181 Encounter for therapeutic drug level monitoring: Secondary | ICD-10-CM | POA: Insufficient documentation

## 2023-05-14 DIAGNOSIS — Z981 Arthrodesis status: Secondary | ICD-10-CM | POA: Diagnosis not present

## 2023-05-14 DIAGNOSIS — R519 Headache, unspecified: Secondary | ICD-10-CM | POA: Diagnosis not present

## 2023-05-14 DIAGNOSIS — M6281 Muscle weakness (generalized): Secondary | ICD-10-CM

## 2023-05-14 DIAGNOSIS — R209 Unspecified disturbances of skin sensation: Secondary | ICD-10-CM | POA: Diagnosis not present

## 2023-05-14 DIAGNOSIS — Z79899 Other long term (current) drug therapy: Secondary | ICD-10-CM | POA: Insufficient documentation

## 2023-05-14 DIAGNOSIS — G894 Chronic pain syndrome: Secondary | ICD-10-CM | POA: Diagnosis not present

## 2023-05-14 MED ORDER — HYDROCODONE-ACETAMINOPHEN 10-325 MG PO TABS
1.0000 | ORAL_TABLET | Freq: Three times a day (TID) | ORAL | 0 refills | Status: AC | PRN
Start: 2023-05-14 — End: ?
  Filled 2023-05-14 – 2023-05-23 (×2): qty 90, 30d supply, fill #0

## 2023-05-15 ENCOUNTER — Encounter: Payer: Medicare Other | Admitting: Registered Nurse

## 2023-05-18 ENCOUNTER — Other Ambulatory Visit: Payer: Self-pay | Admitting: Podiatry

## 2023-05-19 ENCOUNTER — Ambulatory Visit: Payer: Medicare Other | Admitting: Physical Therapy

## 2023-05-21 ENCOUNTER — Ambulatory Visit: Payer: Medicare Other | Admitting: Podiatry

## 2023-05-21 ENCOUNTER — Ambulatory Visit: Payer: Medicare Other | Admitting: Physical Therapy

## 2023-05-22 DIAGNOSIS — R42 Dizziness and giddiness: Secondary | ICD-10-CM | POA: Diagnosis not present

## 2023-05-22 DIAGNOSIS — H6992 Unspecified Eustachian tube disorder, left ear: Secondary | ICD-10-CM | POA: Diagnosis not present

## 2023-05-23 ENCOUNTER — Other Ambulatory Visit: Payer: Self-pay

## 2023-05-27 ENCOUNTER — Ambulatory Visit: Payer: Medicare Other | Attending: Orthopaedic Surgery | Admitting: Physical Therapy

## 2023-05-27 ENCOUNTER — Encounter: Payer: Self-pay | Admitting: Physical Therapy

## 2023-05-27 DIAGNOSIS — M542 Cervicalgia: Secondary | ICD-10-CM | POA: Insufficient documentation

## 2023-05-27 DIAGNOSIS — Z981 Arthrodesis status: Secondary | ICD-10-CM | POA: Insufficient documentation

## 2023-05-27 DIAGNOSIS — R209 Unspecified disturbances of skin sensation: Secondary | ICD-10-CM | POA: Insufficient documentation

## 2023-05-27 DIAGNOSIS — M6281 Muscle weakness (generalized): Secondary | ICD-10-CM | POA: Diagnosis not present

## 2023-05-27 NOTE — Therapy (Signed)
OUTPATIENT PHYSICAL THERAPY TREATMENT   Patient Name: MIKEL KAELIN MRN: [id number] DOB:Apr 22, 1966, 58 y.o., female Today's Date: 05/27/2023  END OF SESSION:  PT End of Session - 05/27/23 1013     Visit Number 5    Number of Visits 16    Date for PT Re-Evaluation 06/20/23    Authorization Type MCR, MCD [place]    PT Start Time 1015    PT Stop Time 1055    PT Time Calculation (min) 40 min                Past Medical History:  Diagnosis Date   Anxiety    Anxiety disorder    Arthritis    Blepharitis    Chronic low back pain    Chronic pain    Depression    Diabetes mellitus without complication (HCC)    Dyslipidemia    Family history of blood clots    Fibromyalgia    GERD (gastroesophageal reflux disease)    Headache    hx migraines   History of kidney stones    History of panic attacks    History of renal calculi    IBS (irritable bowel syndrome)    Interstitial cystitis    Lupus (HCC)    tested positive for antibodies for lupus. 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   Past Surgical History:  Procedure Laterality Date   ACHILLES TENDON SURGERY Right 3/16   ANTERIOR CERVICAL DECOMP/DISCECTOMY FUSION N/A 03/11/2016   Procedure: C5-6, C6-7 Anterior Cervical Discectomy and Fusion, Allograft, Plate;  Surgeon: Eldred Manges, MD;  Location: [facility] OR;  Service: Orthopedics;  Laterality: N/A;   CHOLECYSTECTOMY  2014   COLONOSCOPY WITH PROPOFOL N/A 08/25/2017   Procedure: COLONOSCOPY WITH PROPOFOL;  Surgeon: Scot Jun, MD;  Location: [facility] ENDOSCOPY;  Service: Endoscopy;  Laterality: N/A;   CYSTO WITH  HYDRODISTENSION N/A 01/26/2014   Procedure: CYSTOSCOPY/HYDRODISTENSION;  Surgeon: [name], MD;  Location: [facility];  Service: Urology;  Laterality: N/A;   CYSTO/  URETHRAL DILATION/  HYDRODISTENTION/   INSTILLATION THERAPY  07-16-2010//   12-30-2007//   10-27-2006   ELBOW SURGERY Right    EXERCISE TOLERENCE TEST  10-12-2010   NEGATIVE  ADEQUATE ETT/  NO ISCHEMIA OR EVIDENCE HIGH GRADE OBSTRUCTIVE CAD/  NO FURTHER TEST NEEDED   HYSTEROSCOPY W/  NOVASURE ENDOMETRIAL ABLATION  2014   LAPAROSCOPIC OVARIAN CYST BX  2005   AND  URETEROSCOPIC LASER LITHO  STONE EXTRACTION   SHOULDER OPEN ROTATOR CUFF REPAIR Right 2004   TONSILLECTOMY     TRANSTHORACIC ECHOCARDIOGRAM  06-07-2006   normal study/  ef 60-65%   TUBAL LIGATION Bilateral 1995   Patient Active Problem List   Diagnosis Date Noted   Pain in left knee 04/22/2023   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 [facility]) 01/09/2023   Erythrocytosis 01/23/2022   Pain in left ankle and joints of left foot 12/14/2021   Family history of blood clots 10/09/2021   Lateral epicondylitis,  right elbow 05/25/2019   Atherosclerosis of aorta (HCC) 10/28/2018   Elevated glucose 10/28/2018   Mixed hyperlipidemia 10/28/2018   Hypothyroidism 07/20/2018   LLQ pain 12/03/2017   Pre-diabetes 06/03/2017   Rectal bleeding 06/02/2017   S/P cervical spinal fusion 03/11/2016   Bilateral hand pain 11/29/2015   Elevated C-reactive protein 11/29/2015   Elevated rheumatoid factor 11/29/2015   Raynaud's phenomenon without gangrene 11/29/2015   Bloating 09/12/2014   Dizziness 08/08/2014   Peri-menopause 08/08/2014   Chronic tension-type headache, intractable 07/13/2014   Gastroesophageal reflux disease without esophagitis 07/12/2014   Anxiety 04/18/2014   Chronic arthritis 04/18/2014   Cystitis 04/18/2014   Morbid obesity (HCC) 04/18/2014   Fibromyalgia 04/18/2014   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HCC) 04/18/2014   Osteoporosis,  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   Arthropathy 04/18/2014   Cephalalgia 04/04/2014   Numbness and tingling 04/04/2014   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 02/23/2013   Nonspecific abnormal findings on radiological and other examination of skull and head 02/23/2013   Other nonspecific abnormal result of function study of brain and central nervous system 07/01/2012   Disturbance of skin sensation 07/01/2012   Migraine without aura 07/01/2012   Abdominal pain 02/11/2012   Chronic fatigue syndrome 02/11/2012   Chronic interstitial cystitis 02/11/2012   Dyspareunia 02/11/2012   Dysuria 02/11/2012   Female stress incontinence 02/11/2012   Irritable bowel syndrome with diarrhea 02/11/2012   Nausea and vomiting 02/11/2012   Nocturia 02/11/2012   Urge incontinence 02/11/2012   Urinary urgency 02/11/2012   Lumbar spondylosis 02/04/2012   Myalgia and myositis 02/04/2012   Depression 02/04/2012   Lumbosacral spondylosis without myelopathy 02/04/2012   OBESITY, UNSPECIFIED 10/03/2010   GENERALIZED ANXIETY DISORDER 10/03/2010   Chest pain 10/03/2010    PCP: Lonie Peak PA-C  REFERRING PROVIDER: Eldred Manges, MD  REFERRING DIAG: Z98.1 (ICD-10-CM) - S/P cervical spinal fusion  THERAPY DIAG:  S/P cervical spinal fusion  Cervicalgia  Rationale for Evaluation and Treatment: Rehabilitation  ONSET DATE: chronic   SUBJECTIVE:                                                                                                                                                                                                         SUBJECTIVE STATEMENT: I had a bad week last week, a lot of vertigo, I went to the Doctor. I am able to drive, it is better. She gave me prednisone for the fluid but I am not going to take it. After my last PT I could not lift this arm for 2 days.  I had some bad headaches, the past few days. My foot is killing me.    Hand dominance: Right  PERTINENT HISTORY:   Fibromyalgia, back pain, hip pain ,right shoulder rotator cuff repair, right foot surgery ,Achilles tendinitis, obesity, anxiety, osteoporosis, left knee pain  Dr. Ophelia Charter: Patient requested a repeat knee injection which last year gave her good relief for a period of time. Will set up for some physical therapy for her neck for cervical spondylosis with disc base narrowing above her solid fusion. She has prominent spurs posteriorly and may have some cervical stenosis causing her upper and lower extremity symptoms.   She has had pain in her legs numbness in her feet when she stands or walks. Numbness with prolonged sitting or driving. Some back pain occasionally. Pain sometimes wakes her up at night. She states she was diagnosed with fibromyalgia.  PAIN:  Are you having pain? Yes: NPRS scale: 5/10 Pain location: posterior neck  Pain description: sore Aggravating factors: activity, prolonged sitting Relieving factors: neck pillow, meds, rest  L knee pain with shot in her knee Rt achilles with shot 05/14/23: Rt achilles pain 8/10   PRECAUTIONS: Other: none   RED FLAGS: None     WEIGHT BEARING RESTRICTIONS: No  FALLS:  Has patient fallen in last 6 months? 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 I want to get rid of this dizziness too.   NEXT MD VISIT: Unknown  OBJECTIVE:   DIAGNOSTIC FINDINGS:  Impression: Fused C5-6 C6-7.  Progressive spondylosis adjacent level C4-5  with prominent posterior osteophytes .   PATIENT SURVEYS:  FOTO 42%  COGNITION: Overall cognitive status: Within functional limits for  tasks assessed  SENSATION: WFL UE and LE numb and tingly daily, off and on.  POSTURE: rounded shoulders, forward head, and increased thoracic kyphosis  PALPATION: Pain with palpation to right upper trap and suboccipitals posterior cervicals   CERVICAL ROM:   Active ROM A/PROM (deg) eval  Flexion 46  Extension 55  Right lateral flexion 45  Left lateral flexion 45  Right rotation WFL  Left rotation Pain on L , WFL   (Blank rows = not tested)  UPPER EXTREMITY ROM: Eval: WFLs   Active ROM Right eval Left eval  Shoulder flexion    Shoulder extension    Shoulder abduction    Shoulder adduction    Shoulder extension    Shoulder internal rotation    Shoulder external rotation    Elbow flexion    Elbow extension    Wrist flexion    Wrist extension    Wrist ulnar deviation    Wrist radial deviation    Wrist pronation    Wrist supination     (Blank rows = not tested)  UPPER EXTREMITY MMT:  MMT Right eval Left eval  Shoulder flexion 4- 4  Shoulder extension    Shoulder abduction 3+ 4  Shoulder adduction    Shoulder extension    Shoulder internal rotation    Shoulder external rotation    Middle trapezius    Lower trapezius    Elbow  flexion 4 4  Elbow extension 4 4  Wrist flexion    Wrist extension    Wrist ulnar deviation    Wrist radial deviation    Wrist pronation    Wrist supination    Grip strength 40 42   (Blank rows = not tested)  CERVICAL SPECIAL TESTS:  Neck flexor muscle endurance test: NT on eval  and Distraction test: no change     LOWER EXTREMITY MMT:  WFLs  MMT Right eval Left eval  Hip flexion    Hip extension    Hip abduction    Hip adduction    Hip internal rotation    Hip external rotation    Knee flexion    Knee extension    Ankle dorsiflexion    Ankle plantarflexion    Ankle inversion    Ankle eversion     (Blank rows = not tested)    FUNCTIONAL TESTS:  5 times sit to stand: 11 sec   TODAY'S TREATMENT:  [facility] Adult  PT Treatment:                                                DATE: 05/27/23 Therapeutic Exercise: Nustep L5 x 6 minutes  Supine horiz abdct 10 x 2  Green  Supine alternating diagonals Green band 5 x 2  Supine ER bilat Green 10 x 2  Supine chin tuck over towel 5 sec 10 DNF endurance 32 sec  Manual Therapy: STW to cervical paraspinals, suboccipital release     [facility] Adult PT Treatment:                                                DATE: 05/13/23 Therapeutic Exercise: NuStep L6 UE and LE for 6 min  Slantboard 1 min x 2  Single leg balance for posture and proprioception  Heel raise x 15 , double and single  Standing upper back at wall: isometric shoulder retraction/extension Wall goal post and angels  Green band horizontal abd x 15  Open book (mat) x 5  Hip abduction x 15  Clam x 15  Seated neck stretches, review (rotation and sidebending)  Self Care: Achilles stretching     [facility] Adult PT Treatment:                                                DATE: 05/07/23 Therapeutic Exercise: Nustep L5 UE and LE for 8 min "I like this" Supine narrow grip red band overhead lift Horiz. Pull x 15  Diagonal x 10  Sit to stand x 15 with 10 lbs KB 2nd set added a partial chest press  Calf stretch at the wall for each LE x 30 sec x 3 Wall for posture: goal post , wall angels  x 10   [facility] Adult PT Treatment:  DATE: 05/06/23 Therapeutic Exercise: Supine LTR with head turns  Supine chin tuck x 10 added scapular retraction x 10  Supine abdominal draw -in x 10  Added march  Added UE arcs and combo UE/LE mod cues increased time  Horizontal abduction red band  x 10  Seated on dynadisc core balance for the following  Overhead arms red band  Upper trunk rotation with Pilates circle x 5  Alt. Knee extension on disc Alt. UE and LE lift x 10  Eyes closed Red band UE ER/IR attached  Cervical AROM, rotation, sidebending  BERG BALANCE  Sitting to  Standing: Numbers; 0-4: 4 2. Standing unsupported: Numbers; 0-4: 4 3. Sitting unsupported: Numbers; 0-4: 4 4 Standing to Sitting: Numbers; 0-4: 4 5. Transfers: Numbers; 0-4: 4 6. Standing with eyes closed: Numbers; 0-4: 4 7. Standing with feet together: Numbers; 0-4: 4 8. Reaching forward with outstretched arm: Numbers; 0-4: 4 9. Retrieving object from the floor: Numbers; 0-4: 4 10. Turning to look behind: Numbers; 0-4: 4 11. Turning 360 degrees: Numbers; 0-4: 4 12. Place alternate foot on stool: Numbers; 0-4: 4 13. Standing with one foot in front: Numbers; 0-4: 4 14. Standing on one foot: Numbers; 0-4: 4  Total Score: 56/56                                                                                                                             DATE: 04/25/23   PATIENT EDUCATION:  Education details: PT, POC, balance, causes of dizziness, role of activity with wgt loss  Person educated: Patient Education method: Explanation Education comprehension: verbalized understanding  HOME EXERCISE PROGRAM: Access Code: [facility] URL: [email or web address] Date: 05/14/2023 Prepared by: [name]  Exercises - Standing Cervical Rotation AROM with Overpressure  - 1 x daily - 7 x weekly - 2 sets - 10 reps - 10-15 hold - Seated Cervical Sidebending Stretch  - 1 x daily - 7 x weekly - 2 sets - 10 reps - 10-15 hold - Seated Scapular Retraction  - 1 x daily - 7 x weekly - 2 sets - 10 reps - 5 hold - Seated Cervical Retraction  - 1 x daily - 7 x weekly - 2 sets - 10 reps - 5 hold - Seated Marching with Opposite Shoulder Flexion  - 1 x daily - 7 x weekly - 2 sets - 10 reps - 5 hold - Standing Shoulder Horizontal Abduction with Resistance  - 1 x daily - 7 x weekly - 2 sets - 10 reps - 5 hold - Wall Angels  - 1 x daily - 7 x weekly - 2 sets - 10 reps - 2-3 hold - Gastroc Stretch on Wall  - 1 x daily - 7 x weekly - 1 sets - 5 reps - 30 hold - Standing Bilateral Heel Raise on Step  - 1  x daily - 7 x weekly - 2 sets - 10 reps - Single Leg Stance with  Support  - 1 x daily - 7 x weekly - 1 sets - 3-5 reps - 30 hold  ASSESSMENT:  CLINICAL IMPRESSION: Pt reports increased left shoulder pain after last session. She reports limited compliance with HEP last week due to vertigo flare. Worked on supine neck and scap stabilization with good tolerance. Updated HEP pt pt request. Gentle STW to posterior neck at end of session with pt reporting tenderness along left paraspinals. She will continue to  benefit from skilled physical therapy for multiple areas of pain and to improve confidence with mobility.   OBJECTIVE IMPAIRMENTS: decreased balance, decreased coordination, decreased endurance, decreased mobility, difficulty walking, decreased ROM, decreased strength, dizziness, hypomobility, increased fascial restrictions, impaired flexibility, impaired sensation, impaired UE functional use, postural dysfunction, obesity, and pain.   ACTIVITY LIMITATIONS: carrying, lifting, bending, sitting, standing, squatting, sleeping, transfers, bed mobility, reach over head, and locomotion level  PARTICIPATION LIMITATIONS: meal prep, cleaning, laundry, interpersonal relationship, driving, shopping, and community activity  PERSONAL FACTORS: Past/current experiences, Social background, Time since onset of injury/illness/exacerbation, and 3+ comorbidities: fibro, Rt ankle pain, L knee pain  are also affecting patient's functional outcome.   REHAB POTENTIAL: Good  CLINICAL DECISION MAKING: Evolving/moderate complexity  EVALUATION COMPLEXITY: Moderate   GOALS: Goals reviewed with patient? Yes SHORT TERM GOALS: Target date: 05/23/2023     Patient will be independent with home program for posture strength and mobility.  Baseline: up to date  05/27/23: less compliant last week due to vertigo  Goal status: ongoing   2.  Patient will be able to report 3 self-care strategies for pain relief Baseline:   05/27/23: muscle rub, soft neck brace, neck pillow  Goal status: MET    3.  Patient will be able to complete functional testing /balance screen and have goals set Baseline:  Goal status:MET    LONG TERM GOALS: Target date: 06/06/2023   Patient will improve Foto score to 50% or better to demonstrate improved functional mobility Baseline:  Goal status:ongoing   2.  Pt will be able to walk on the treadmill 10 minutes every day to build the habit of exercise Baseline: LE numbness increases  05/27/23: gave the T.M to son this weekend -interested in purchasing stepper  Goal status: ongoing  3.  Patient will be able to report sensory symptoms in upper body improved by 25% Baseline: daily, moderate throughout the day  Goal status: ongoing   4.  Patient will be able to complete light housework with no increase in pain Baseline: moderate pain in neck and UEs  Goal status: ongoing   5.  Patient will be able to fully perform active range of motion in cervical spine without increased pain Baseline: Pain with left rotation and sidebending right and left Goal status: ongoing   6.  Patient will be able to lift 10 pounds from the floor to shoulder height x 10 to demo improved functional strength Baseline:  Goal status: ongoing    PLAN:  PT FREQUENCY: 2x/week  PT DURATION: 8 weeks  PLANNED INTERVENTIONS: Therapeutic exercises, Therapeutic activity, Neuromuscular re-education, Balance training, Gait training, Patient/Family education, Self Care, Joint mobilization, Aquatic Therapy, Dry Needling, Spinal mobilization, Cryotherapy, Moist heat, Taping, Manual therapy, and Re-evaluation  PLAN FOR NEXT SESSION:  Gentle cervical ROM, shoulder ROM and strength, posture, modalities as needed.  Standing balance, Achilles   Jannette Spanner, PTA 05/27/23 11:07 AM Phone: [phone] Fax: [phone]

## 2023-05-29 ENCOUNTER — Ambulatory Visit: Payer: Medicare Other | Admitting: Physical Therapy

## 2023-05-29 ENCOUNTER — Encounter: Payer: Self-pay | Admitting: Physical Therapy

## 2023-05-29 ENCOUNTER — Encounter: Payer: Self-pay | Admitting: Podiatry

## 2023-05-29 ENCOUNTER — Ambulatory Visit (INDEPENDENT_AMBULATORY_CARE_PROVIDER_SITE_OTHER): Payer: Medicare Other | Admitting: Podiatry

## 2023-05-29 DIAGNOSIS — R209 Unspecified disturbances of skin sensation: Secondary | ICD-10-CM | POA: Diagnosis not present

## 2023-05-29 DIAGNOSIS — Z981 Arthrodesis status: Secondary | ICD-10-CM

## 2023-05-29 DIAGNOSIS — M7661 Achilles tendinitis, right leg: Secondary | ICD-10-CM | POA: Diagnosis not present

## 2023-05-29 DIAGNOSIS — T148XXA Other injury of unspecified body region, initial encounter: Secondary | ICD-10-CM

## 2023-05-29 DIAGNOSIS — M542 Cervicalgia: Secondary | ICD-10-CM | POA: Diagnosis not present

## 2023-05-29 DIAGNOSIS — M6281 Muscle weakness (generalized): Secondary | ICD-10-CM | POA: Diagnosis not present

## 2023-05-29 NOTE — Therapy (Signed)
OUTPATIENT PHYSICAL THERAPY TREATMENT   Patient Name: Karina Greer MRN: [id number] DOB:Sep 13, 1966, 57 y.o., female Today's Date: 05/29/2023  END OF SESSION:  PT End of Session - 05/29/23 1006     Visit Number 6    Number of Visits 16    Date for PT Re-Evaluation 06/20/23    Authorization Type MCR, MCD [place]    PT Start Time 1015    PT Stop Time 1058    PT Time Calculation (min) 43 min                Past Medical History:  Diagnosis Date   Anxiety    Anxiety disorder    Arthritis    Blepharitis    Chronic low back pain    Chronic pain    Depression    Diabetes mellitus without complication (HCC)    Dyslipidemia    Family history of blood clots    Fibromyalgia    GERD (gastroesophageal reflux disease)    Headache    hx migraines   History of kidney stones    History of panic attacks    History of renal calculi    IBS (irritable bowel syndrome)    Interstitial cystitis    Lupus (HCC)    tested positive for antibodies for lupus. 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   Past Surgical History:  Procedure Laterality Date   ACHILLES TENDON SURGERY Right 3/16   ANTERIOR CERVICAL DECOMP/DISCECTOMY FUSION N/A 03/11/2016   Procedure: C5-6, C6-7 Anterior Cervical Discectomy and Fusion, Allograft, Plate;  Surgeon: Eldred Manges, MD;  Location: [facility] OR;  Service: Orthopedics;  Laterality: N/A;   CHOLECYSTECTOMY  2014   COLONOSCOPY WITH PROPOFOL N/A 08/25/2017   Procedure: COLONOSCOPY WITH PROPOFOL;  Surgeon: Scot Jun, MD;  Location: [facility] ENDOSCOPY;  Service: Endoscopy;  Laterality: N/A;   CYSTO WITH  HYDRODISTENSION N/A 01/26/2014   Procedure: CYSTOSCOPY/HYDRODISTENSION;  Surgeon: [name], MD;  Location: [facility];  Service: Urology;  Laterality: N/A;   CYSTO/  URETHRAL DILATION/  HYDRODISTENTION/   INSTILLATION THERAPY  07-16-2010//   12-30-2007//   10-27-2006   ELBOW SURGERY Right    EXERCISE TOLERENCE TEST  10-12-2010   NEGATIVE  ADEQUATE ETT/  NO ISCHEMIA OR EVIDENCE HIGH GRADE OBSTRUCTIVE CAD/  NO FURTHER TEST NEEDED   HYSTEROSCOPY W/  NOVASURE ENDOMETRIAL ABLATION  2014   LAPAROSCOPIC OVARIAN CYST BX  2005   AND  URETEROSCOPIC LASER LITHO  STONE EXTRACTION   SHOULDER OPEN ROTATOR CUFF REPAIR Right 2004   TONSILLECTOMY     TRANSTHORACIC ECHOCARDIOGRAM  06-07-2006   normal study/  ef 60-65%   TUBAL LIGATION Bilateral 1995   Patient Active Problem List   Diagnosis Date Noted   Pain in left knee 04/22/2023   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 [facility]) 01/09/2023   Erythrocytosis 01/23/2022   Pain in left ankle and joints of left foot 12/14/2021   Family history of blood clots 10/09/2021   Lateral epicondylitis,  right elbow 05/25/2019   Atherosclerosis of aorta (HCC) 10/28/2018   Elevated glucose 10/28/2018   Mixed hyperlipidemia 10/28/2018   Hypothyroidism 07/20/2018   LLQ pain 12/03/2017   Pre-diabetes 06/03/2017   Rectal bleeding 06/02/2017   S/P cervical spinal fusion 03/11/2016   Bilateral hand pain 11/29/2015   Elevated C-reactive protein 11/29/2015   Elevated rheumatoid factor 11/29/2015   Raynaud's phenomenon without gangrene 11/29/2015   Bloating 09/12/2014   Dizziness 08/08/2014   Peri-menopause 08/08/2014   Chronic tension-type headache, intractable 07/13/2014   Gastroesophageal reflux disease without esophagitis 07/12/2014   Anxiety 04/18/2014   Chronic arthritis 04/18/2014   Cystitis 04/18/2014   Morbid obesity (HCC) 04/18/2014   Fibromyalgia 04/18/2014   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HCC) 04/18/2014   Osteoporosis,  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   Arthropathy 04/18/2014   Cephalalgia 04/04/2014   Numbness and tingling 04/04/2014   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 02/23/2013   Nonspecific abnormal findings on radiological and other examination of skull and head 02/23/2013   Other nonspecific abnormal result of function study of brain and central nervous system 07/01/2012   Disturbance of skin sensation 07/01/2012   Migraine without aura 07/01/2012   Abdominal pain 02/11/2012   Chronic fatigue syndrome 02/11/2012   Chronic interstitial cystitis 02/11/2012   Dyspareunia 02/11/2012   Dysuria 02/11/2012   Female stress incontinence 02/11/2012   Irritable bowel syndrome with diarrhea 02/11/2012   Nausea and vomiting 02/11/2012   Nocturia 02/11/2012   Urge incontinence 02/11/2012   Urinary urgency 02/11/2012   Lumbar spondylosis 02/04/2012   Myalgia and myositis 02/04/2012   Depression 02/04/2012   Lumbosacral spondylosis without myelopathy 02/04/2012   OBESITY, UNSPECIFIED 10/03/2010   GENERALIZED ANXIETY DISORDER 10/03/2010   Chest pain 10/03/2010    PCP: Lonie Peak PA-C  REFERRING PROVIDER: Eldred Manges, MD  REFERRING DIAG: Z98.1 (ICD-10-CM) - S/P cervical spinal fusion  THERAPY DIAG:  S/P cervical spinal fusion  Cervicalgia  Rationale for Evaluation and Treatment: Rehabilitation  ONSET DATE: chronic   SUBJECTIVE:                                                                                                                                                                                                         SUBJECTIVE STATEMENT: I was a little sore on the left side of the neck after last time.   Hand dominance: Right  PERTINENT HISTORY:  Fibromyalgia, back pain, hip pain ,right shoulder rotator cuff repair, right foot surgery ,Achilles tendinitis, obesity, anxiety, osteoporosis, left knee pain  Dr. Ophelia Charter: Patient requested a repeat  knee injection which last year  gave her good relief for a period of time. Will set up for some physical therapy for her neck for cervical spondylosis with disc base narrowing above her solid fusion. She has prominent spurs posteriorly and may have some cervical stenosis causing her upper and lower extremity symptoms.   She has had pain in her legs numbness in her feet when she stands or walks. Numbness with prolonged sitting or driving. Some back pain occasionally. Pain sometimes wakes her up at night. She states she was diagnosed with fibromyalgia.  PAIN:  Are you having pain? Yes: NPRS scale: 5/10 Pain location: posterior neck  Pain description: sore Aggravating factors: activity, prolonged sitting Relieving factors: neck pillow, meds, rest  L knee pain with shot in her knee Rt achilles with shot 05/14/23: Rt achilles pain 8/10   PRECAUTIONS: Other: none   RED FLAGS: None     WEIGHT BEARING RESTRICTIONS: No  FALLS:  Has patient fallen in last 6 months? 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 I want to get rid of this dizziness too.   NEXT MD VISIT: Unknown  OBJECTIVE:   DIAGNOSTIC FINDINGS:  Impression: Fused C5-6 C6-7.  Progressive spondylosis adjacent level C4-5  with prominent posterior osteophytes .   PATIENT SURVEYS:  FOTO 42% 05/29/23: 49% (predicted 50%)   COGNITION: Overall cognitive status: Within functional limits for tasks assessed  SENSATION: WFL UE and LE numb and tingly daily, off and on.  POSTURE: rounded shoulders, forward head, and increased thoracic kyphosis  PALPATION: Pain with palpation to right  upper trap and suboccipitals posterior cervicals   CERVICAL ROM:   Active ROM A/PROM (deg) eval AROM  05/29/23  Flexion 46   Extension 55   Right lateral flexion 45   Left lateral flexion 45   Right rotation WFL   Left rotation Pain on L , WFL Pain looking left, less, WFL   (Blank rows = not tested)  UPPER EXTREMITY ROM: Eval: WFLs   Active ROM Right eval Left eval  Shoulder flexion    Shoulder extension    Shoulder abduction    Shoulder adduction    Shoulder extension    Shoulder internal rotation    Shoulder external rotation    Elbow flexion    Elbow extension    Wrist flexion    Wrist extension    Wrist ulnar deviation    Wrist radial deviation    Wrist pronation    Wrist supination     (Blank rows = not tested)  UPPER EXTREMITY MMT:  MMT Right eval Left eval  Shoulder flexion 4- 4  Shoulder extension    Shoulder abduction 3+ 4  Shoulder adduction    Shoulder extension    Shoulder internal rotation    Shoulder external rotation    Middle trapezius    Lower trapezius    Elbow flexion 4 4  Elbow extension 4 4  Wrist flexion    Wrist extension    Wrist ulnar deviation    Wrist radial deviation    Wrist pronation  Wrist supination    Grip strength 40 42   (Blank rows = not tested)  CERVICAL SPECIAL TESTS:  Neck flexor muscle endurance test: NT on eval  and Distraction test: no change     LOWER EXTREMITY MMT:  WFLs  MMT Right eval Left eval  Hip flexion    Hip extension    Hip abduction    Hip adduction    Hip internal rotation    Hip external rotation    Knee flexion    Knee extension    Ankle dorsiflexion    Ankle plantarflexion    Ankle inversion    Ankle eversion     (Blank rows = not tested)    FUNCTIONAL TESTS:  5 times sit to stand: 11 sec   TODAY'S TREATMENT:  [facility] Adult PT Treatment:                                                DATE: 05/29/23 Therapeutic Exercise: Nustep L5 x 6 minutes  Green band row x 15 Green  band ext x 15  Slant board 60 sec  Tandem stance 15- 20 sec  Seated horiz abdct 10 x 2  Supine ER bilat Green 10 x 2   Manual Therapy: STW to cervical paraspinals, suboccipital release   Self Care: TPR with theracane   [facility] Adult PT Treatment:                                                DATE: 05/27/23 Therapeutic Exercise: Nustep L5 x 6 minutes  Supine horiz abdct 10 x 2  Green  Supine alternating diagonals Green band 5 x 2  Supine ER bilat Green 10 x 2  Supine chin tuck over towel 5 sec 10 DNF endurance 32 sec  Manual Therapy: STW to cervical paraspinals, suboccipital release     [facility] Adult PT Treatment:                                                DATE: 05/13/23 Therapeutic Exercise: NuStep L6 UE and LE for 6 min  Slantboard 1 min x 2  Single leg balance for posture and proprioception  Heel raise x 15 , double and single  Standing upper back at wall: isometric shoulder retraction/extension Wall goal post and angels  Green band horizontal abd x 15  Open book (mat) x 5  Hip abduction x 15  Clam x 15  Seated neck stretches, review (rotation and sidebending)  Self Care: Achilles stretching     [facility] Adult PT Treatment:                                                DATE: 05/07/23 Therapeutic Exercise: Nustep L5 UE and LE for 8 min "I like this" Supine narrow grip red band overhead lift Horiz. Pull x 15  Diagonal x 10  Sit to stand x 15 with 10 lbs KB 2nd set added a  partial chest press  Calf stretch at the wall for each LE x 30 sec x 3 Wall for posture: goal post , wall angels  x 10   [facility] Adult PT Treatment:                                                DATE: 05/06/23 Therapeutic Exercise: Supine LTR with head turns  Supine chin tuck x 10 added scapular retraction x 10  Supine abdominal draw -in x 10  Added march  Added UE arcs and combo UE/LE mod cues increased time  Horizontal abduction red band  x 10  Seated on dynadisc core balance for the following   Overhead arms red band  Upper trunk rotation with Pilates circle x 5  Alt. Knee extension on disc Alt. UE and LE lift x 10  Eyes closed Red band UE ER/IR attached  Cervical AROM, rotation, sidebending  BERG BALANCE  Sitting to Standing: Numbers; 0-4: 4 2. Standing unsupported: Numbers; 0-4: 4 3. Sitting unsupported: Numbers; 0-4: 4 4 Standing to Sitting: Numbers; 0-4: 4 5. Transfers: Numbers; 0-4: 4 6. Standing with eyes closed: Numbers; 0-4: 4 7. Standing with feet together: Numbers; 0-4: 4 8. Reaching forward with outstretched arm: Numbers; 0-4: 4 9. Retrieving object from the floor: Numbers; 0-4: 4 10. Turning to look behind: Numbers; 0-4: 4 11. Turning 360 degrees: Numbers; 0-4: 4 12. Place alternate foot on stool: Numbers; 0-4: 4 13. Standing with one foot in front: Numbers; 0-4: 4 14. Standing on one foot: Numbers; 0-4: 4  Total Score: 56/56                                                                                                                             DATE: 04/25/23   PATIENT EDUCATION:  Education details: PT, POC, balance, causes of dizziness, role of activity with wgt loss  Person educated: Patient Education method: Explanation Education comprehension: verbalized understanding  HOME EXERCISE PROGRAM: Access Code: [facility] URL: [email or web address] Date: 05/14/2023 Prepared by: [name]  Exercises - Standing Cervical Rotation AROM with Overpressure  - 1 x daily - 7 x weekly - 2 sets - 10 reps - 10-15 hold - Seated Cervical Sidebending Stretch  - 1 x daily - 7 x weekly - 2 sets - 10 reps - 10-15 hold - Seated Scapular Retraction  - 1 x daily - 7 x weekly - 2 sets - 10 reps - 5 hold - Seated Cervical Retraction  - 1 x daily - 7 x weekly - 2 sets - 10 reps - 5 hold - Seated Marching with Opposite Shoulder Flexion  - 1 x daily - 7 x weekly - 2 sets - 10 reps - 5 hold - Standing Shoulder Horizontal Abduction with Resistance  - 1 x daily -  7 x  weekly - 2 sets - 10 reps - 5 hold - Wall Angels  - 1 x daily - 7 x weekly - 2 sets - 10 reps - 2-3 hold - Gastroc Stretch on Wall  - 1 x daily - 7 x weekly - 1 sets - 5 reps - 30 hold - Standing Bilateral Heel Raise on Step  - 1 x daily - 7 x weekly - 2 sets - 10 reps - Single Leg Stance with Support  - 1 x daily - 7 x weekly - 1 sets - 3-5 reps - 30 hold  ASSESSMENT:  CLINICAL IMPRESSION: FOTO score improved nearly meeting Target. Pt demonstrates decreased pain with left cervical rotation. Able to progress to upright scapular bands today with good tolerance. Cues given to avoid over doing the reps and to stretch in between sets as needed. Repeated manual therapy to reduce left neck pain.    Pt reports increased left shoulder pain after last session. She reports limited compliance with HEP last week due to vertigo flare. Worked on supine neck and scap stabilization with good tolerance. Updated HEP pt pt request. Gentle STW to posterior neck at end of session with pt reporting tenderness along left paraspinals. She will continue to  benefit from skilled physical therapy for multiple areas of pain and to improve confidence with mobility.   OBJECTIVE IMPAIRMENTS: decreased balance, decreased coordination, decreased endurance, decreased mobility, difficulty walking, decreased ROM, decreased strength, dizziness, hypomobility, increased fascial restrictions, impaired flexibility, impaired sensation, impaired UE functional use, postural dysfunction, obesity, and pain.   ACTIVITY LIMITATIONS: carrying, lifting, bending, sitting, standing, squatting, sleeping, transfers, bed mobility, reach over head, and locomotion level  PARTICIPATION LIMITATIONS: meal prep, cleaning, laundry, interpersonal relationship, driving, shopping, and community activity  PERSONAL FACTORS: Past/current experiences, Social background, Time since onset of injury/illness/exacerbation, and 3+ comorbidities: fibro, Rt ankle  pain, L knee pain  are also affecting patient's functional outcome.   REHAB POTENTIAL: Good  CLINICAL DECISION MAKING: Evolving/moderate complexity  EVALUATION COMPLEXITY: Moderate   GOALS: Goals reviewed with patient? Yes SHORT TERM GOALS: Target date: 05/23/2023     Patient will be independent with home program for posture strength and mobility.  Baseline: up to date  05/27/23: less compliant last week due to vertigo  Goal status: ongoing   2.  Patient will be able to report 3 self-care strategies for pain relief Baseline:  05/27/23: muscle rub, soft neck brace, neck pillow  Goal status: MET    3.  Patient will be able to complete functional testing /balance screen and have goals set Baseline:  Goal status:MET    LONG TERM GOALS: Target date: 06/06/2023   Patient will improve Foto score to 50% or better to demonstrate improved functional mobility Baseline:  Goal status:ongoing   2.  Pt will be able to walk on the treadmill 10 minutes every day to build the habit of exercise Baseline: LE numbness increases  05/27/23: gave the T.M to son this weekend -interested in purchasing stepper  Goal status: ongoing  3.  Patient will be able to report sensory symptoms in upper body improved by 25% Baseline: daily, moderate throughout the day  Goal status: ongoing   4.  Patient will be able to complete light housework with no increase in pain Baseline: moderate pain in neck and UEs  Goal status: ongoing   5.  Patient will be able to fully perform active range of motion in cervical spine without increased pain Baseline: Pain with left rotation  and sidebending right and left Goal status: ongoing   6.  Patient will be able to lift 10 pounds from the floor to shoulder height x 10 to demo improved functional strength Baseline:  Goal status: ongoing    PLAN:  PT FREQUENCY: 2x/week  PT DURATION: 8 weeks  PLANNED INTERVENTIONS: Therapeutic exercises, Therapeutic activity,  Neuromuscular re-education, Balance training, Gait training, Patient/Family education, Self Care, Joint mobilization, Aquatic Therapy, Dry Needling, Spinal mobilization, Cryotherapy, Moist heat, Taping, Manual therapy, and Re-evaluation  PLAN FOR NEXT SESSION:  Gentle cervical ROM, shoulder ROM and strength, posture, modalities as needed.  Standing balance, Achilles, [employer], PTA 05/29/23 12:50 PM Phone: [phone] Fax: [phone]

## 2023-05-29 NOTE — Progress Notes (Signed)
Subjective:   Patient ID: Karina Greer, female   DOB: 57 y.o.   MRN: [id number]   HPI Patient presented stating she is still having a tremendous amount of discomfort in the back of the right heel and it seems to have intensified again recently   ROS      Objective:  Physical Exam  Neurovascular status intact exquisite discomfort posterior aspect right heel with swelling noted of the Achilles at insertion calcaneus     Assessment:  Inflammatory Achilles tendinitis right with spur formation     Plan:  Due to the intensity of discomfort I am ordering an MRI of the posterior heel right to try to rule out any other pathology that may be present and patient will have this done ice therapy can be used short-term along with boot.  Reappoint when we get results of MRI

## 2023-05-30 ENCOUNTER — Encounter: Payer: Medicare Other | Attending: Physical Medicine & Rehabilitation | Admitting: Physical Medicine and Rehabilitation

## 2023-05-30 DIAGNOSIS — M7660 Achilles tendinitis, unspecified leg: Secondary | ICD-10-CM

## 2023-05-30 DIAGNOSIS — M5090 Cervical disc disorder, unspecified, unspecified cervical region: Secondary | ICD-10-CM

## 2023-05-30 DIAGNOSIS — M797 Fibromyalgia: Secondary | ICD-10-CM | POA: Insufficient documentation

## 2023-05-30 NOTE — Progress Notes (Signed)
Subjective:    Patient ID: Karina Greer, female    DOB: December 26, 1965, 57 y.o.   MRN: [id number]  An audio/video tele-health visit is felt to be the most appropriate encounter for this patient at this time. This is a follow up tele-visit via phone. The patient is at home. MD is at office. Prior to scheduling this appointment, our staff discussed the limitations of evaluation and management by telemedicine and the availability of in-person appointments. The patient expressed understanding and agreed to proceed.   Karina Greer is a 57 y.o. female who presents for f/u of fibromyalgia and interstitial cystitis.   1) Interstitial cystitis -she has been having burning when she urinates -she got UA done yesterday, discussed results with her today  2) Diffuse chronic pain: back pain and diffuse in joints and muscles -her back has been more stiff recently -cold weather worsens her symptoms -she tried naltrexone, she felt like it made her sick, she feels nauseous every day so it wasn't that- it was more flulike symptoms. This was with the 1mg  of naltrexone. She did not take opioid that day.  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 Sometimes she does not eat until lunch. She has been trying to do the fasting we discussed last visit.  -Her son is 278 pounds and 6 foot tall. He went to 242 lbs following a similar diet.  -Her dietary goals are to make use of what she eats.  -she has been eating a lot of salads.  -she cannot handle hard  cheeses.  -she has been having a problem eating hamburger.  -Last night she made four pieces of chicken and put on some tomato sauce and red peppers, she sprinkled some cheese, added broccoli, olive oil, garlic, butter.  -she is planned to have her HgbA1c and lipid panel drawn by her PCP next week.  -has been having low back pain, MRI ordered and shows stable spondylosis from prior -she feels better when there is no food in her stomach -she got organic oranges from a farm and is going to make her own orange juice -she bought the Bioptemizer's magnesium so will start this soon -she is having a lot of pain in her neck and feels this is from the weather and her surgery -her calf is hurting a lot. She has not had an ultrasound. -blood clots run in her family.   -she bought [place], Advice worker, magnesium breakthrough, Krill oil, Spirulina, Coenzyme Q10 -she saw an endocrinologist and she did a swab test -she went today for bloodwork to check her potassium, cortisol to look for Cushing's Disease -she feels like her hormones are off balance.  -she feels that she has all the symptoms of Cushing's Disease -she was told her rheum panel is normal.  -she was told she has a meniscal tear by ortho   3) Trigeminal neuralgia bilateral: -she said a procedure on her left side with good effect -she tried carbamezepine and it made  her stare into outer space  4) Obesity -she is down to 225 lbs!  5) MTHFR mutation -discussed that homocysteine level is 7.3, which is in the normal range -she tries to eat healthy -she does not smoke or use alcohol  6) Cervical disc disease -she was told by Dr. Ophelia Charter that she will need surgery but she doesn't think she can do this at this time as she is taking care of both her son and mother.  -she is doing PT and this seems to help -she would be interested in trying a tens unit and a cervical traction device  Pain Inventory Average Pain 8 Pain Right Now 6 My pain is  constant, sharp, burning, dull, stabbing, and tingling  In the last 24 hours, has pain interfered with the following? General activity 5 Relation with others 5 Enjoyment of life 3 What TIME of day is your pain at its worst? morning , daytime, and night Sleep (in general) Poor  Pain is worse with: walking, bending, sitting, inactivity, standing, unsure, and some activites Pain improves with: rest, heat/ice, therapy/exercise, pacing activities, medication, TENS, and injections Relief from Meds: 7  Family History  Problem Relation Age of Onset   Hypertension Mother    Diabetes Father    Brain cancer Father    Clotting disorder Father        blood clots   Fibromyalgia Sister    Suicidality Sister    Hypertension Other        Cancer, Cerebrovascular disease run on mother side of family   Social History   Socioeconomic History   Marital status: Divorced    Spouse name: Not on file   Number of children: 3   Years of education: HS   Highest education level: 12th grade  Occupational History   Occupation: Designer, industrial/product: UNEMPLOYED    Comment: Disability  Tobacco Use   Smoking status: Never   Smokeless tobacco: Never  Vaping Use   Vaping status: Never Used  Substance and Sexual Activity   Alcohol use: No   Drug use: No   Sexual activity: Not on file  Other Topics Concern   Not on file  Social History Narrative   Patient is right-handed. She avoids caffeine. She has recently been using the treadmill.   Social Determinants of Health   Financial Resource Strain: Not on file  Food Insecurity: Not on file  Transportation Needs: Not on file  Physical Activity: Not on file  Stress: Not on file  Social Connections: Not on file   Past Surgical History:  Procedure Laterality Date   ACHILLES TENDON SURGERY Right 3/16   ANTERIOR CERVICAL DECOMP/DISCECTOMY FUSION N/A 03/11/2016   Procedure: C5-6, C6-7 Anterior Cervical Discectomy and Fusion, Allograft, Plate;  Surgeon:  Eldred Manges, MD;  Location: [facility] OR;  Service: Orthopedics;  Laterality: N/A;   CHOLECYSTECTOMY  2014   COLONOSCOPY WITH PROPOFOL N/A 08/25/2017   Procedure: COLONOSCOPY WITH PROPOFOL;  Surgeon: Scot Jun, MD;  Location: [facility] ENDOSCOPY;  Service: Endoscopy;  Laterality: N/A;   CYSTO WITH HYDRODISTENSION N/A 01/26/2014   Procedure: CYSTOSCOPY/HYDRODISTENSION;  Surgeon: [name], MD;  Location: [facility];  Service: Urology;  Laterality: N/A;   CYSTO/  URETHRAL DILATION/  HYDRODISTENTION/   INSTILLATION THERAPY  07-16-2010//   12-30-2007//   10-27-2006   ELBOW SURGERY Right    EXERCISE TOLERENCE TEST  10-12-2010   NEGATIVE  ADEQUATE ETT/  NO ISCHEMIA OR EVIDENCE HIGH GRADE  OBSTRUCTIVE CAD/  NO FURTHER TEST NEEDED   HYSTEROSCOPY W/  NOVASURE ENDOMETRIAL ABLATION  2014   LAPAROSCOPIC OVARIAN CYST BX  2005   AND  URETEROSCOPIC LASER LITHO  STONE EXTRACTION   SHOULDER OPEN ROTATOR CUFF REPAIR Right 2004   TONSILLECTOMY     TRANSTHORACIC ECHOCARDIOGRAM  06-07-2006   normal study/  ef 60-65%   TUBAL LIGATION Bilateral 1995   Past Surgical History:  Procedure Laterality Date   ACHILLES TENDON SURGERY Right 3/16   ANTERIOR CERVICAL DECOMP/DISCECTOMY FUSION N/A 03/11/2016   Procedure: C5-6, C6-7 Anterior Cervical Discectomy and Fusion, Allograft, Plate;  Surgeon: Eldred Manges, MD;  Location: [facility] OR;  Service: Orthopedics;  Laterality: N/A;   CHOLECYSTECTOMY  2014   COLONOSCOPY WITH PROPOFOL N/A 08/25/2017   Procedure: COLONOSCOPY WITH PROPOFOL;  Surgeon: Scot Jun, MD;  Location: [facility] ENDOSCOPY;  Service: Endoscopy;  Laterality: N/A;   CYSTO WITH HYDRODISTENSION N/A 01/26/2014   Procedure: CYSTOSCOPY/HYDRODISTENSION;  Surgeon: [name], MD;  Location: [facility];  Service: Urology;  Laterality: N/A;   CYSTO/  URETHRAL DILATION/  HYDRODISTENTION/   INSTILLATION THERAPY  07-16-2010//   12-30-2007//   10-27-2006   ELBOW SURGERY Right    EXERCISE  TOLERENCE TEST  10-12-2010   NEGATIVE  ADEQUATE ETT/  NO ISCHEMIA OR EVIDENCE HIGH GRADE OBSTRUCTIVE CAD/  NO FURTHER TEST NEEDED   HYSTEROSCOPY W/  NOVASURE ENDOMETRIAL ABLATION  2014   LAPAROSCOPIC OVARIAN CYST BX  2005   AND  URETEROSCOPIC LASER LITHO  STONE EXTRACTION   SHOULDER OPEN ROTATOR CUFF REPAIR Right 2004   TONSILLECTOMY     TRANSTHORACIC ECHOCARDIOGRAM  06-07-2006   normal study/  ef 60-65%   TUBAL LIGATION Bilateral 1995   Past Medical History:  Diagnosis Date   Anxiety    Anxiety disorder    Arthritis    Blepharitis    Chronic low back pain    Chronic pain    Depression    Diabetes mellitus without complication (HCC)    Dyslipidemia    Family history of blood clots    Fibromyalgia    GERD (gastroesophageal reflux disease)    Headache    hx migraines   History of kidney stones    History of panic attacks    History of renal calculi    IBS (irritable bowel syndrome)    Interstitial cystitis    Lupus (HCC)    tested positive for antibodies for lupus. 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   There were no vitals taken for this visit.  Opioid Risk Score:   Fall Risk Score:  `1  Depression screen [facility] 2/9     05/14/2023    9:07 AM 02/24/2023    8:38 AM 01/24/2023    9:02 AM 12/27/2022    8:42 AM 11/29/2022   10:37 AM 10/04/2022    8:29 AM 09/27/2022    9:50 AM  Depression screen PHQ 2/9  Decreased Interest 0 0 0 0 0  0  Down, Depressed, Hopeless 0 0 0 0 0  0  PHQ - 2 Score 0 0 0 0 0  0  Altered sleeping         Tired, decreased energy         Change in appetite         Feeling bad or failure about  yourself          Trouble concentrating         Moving slowly or fidgety/restless         Suicidal thoughts         PHQ-9 Score            Information is confidential and restricted. Go to Review Flowsheets to unlock data.    Review of Systems  Musculoskeletal:  Positive for arthralgias and back pain.       Pain all over the body  All other systems reviewed and are negative.      Objective:   Physical Exam Gen: no distress, normal appearing, weight 229 lbs, BMI 40.57 HEENT: oral mucosa pink and moist, NCAT Cardio: Reg rate Chest: normal effort, normal rate of breathing Abd: soft, non-distended Ext: no edema Psych: pleasant, normal affect Skin: intact Neuro: Alert and oriented x3    Assessment & Plan:  1. Lumbago/ Lumbar Spondylosis/ Left Lumbar Radiculitis: Continue to Monitor. 09/02/202. -Refilled HYDROcodone 10/325mg  one tablet every 8 hours as needed #90. Discussed that MRI results show spondylosis that is stable from prior. Continue using muscle rub. Refilled Lidocaine patches.   -discussed weaning off if she can tolerate.  -discussed risks and benefits of bracing. Prescribed lumboscaral brace with the goal of reducing pain medication use and improving mobility.   -prescribed Zynex Tens unit  -discussed mechanism of action of low dose naltrexone as an opioid receptor antagonist which stimulates your body's production of its own natural endogenous opioids, helping to decrease pain. Discussed that it can also decrease T cell response and thus be helpful in decreasing inflammation, and symptoms of brain fog, fatigue, anxiety, depression, and allergies. Discussed that this medication needs to be compounded at a compounding pharmacy and can more expensive. Discussed that I usually start at 1mg  and if this is not providing enough relief then I titrate upward on a monthly basis.    -UDS monitoring routinely  -Discussed her worsening pain with the cold weather  -refilled lidocaine  patches  2. Fibromyalgia. Continue Current exercise Regime. 05/25/2021. Discussed aqua therapy.  -recommended infrared light therapy -ordered Zynex Medical heating blanket for 90 days, discussed the benefits of heat in improving blood circulation.  -discussed use of TENs unit  3. Anxiety and depression:Psychiatry Following: Counselor Jessica  at [facility] . Continue Counseling at [facility]. 05/25/2021  4. Migraines: On Maxalt. Neurology Following. 05/25/2021. Start Bioptemizers magnesium.   5. OSA : PCP Following. Continue to Monitor. 05/25/2021  6. Obesity:   -commended on 6 lbs loss! -continue to avoid bread -check vitamin D level today.  -continue 2 TB of saurkraut every morning  -recommended to use her krill oil, Vitamin D, [place], magnesium breakthrough  -encouraged dandelion tea  -discussed wormwood/walnut oil cleanse -recommended nattokinase -encouraged endocrinology follow-up -counseled regarding the benefits of intermittent fasting and improving insulin sensitivity. -Educated that current weight is 225 lbs and current BMI is 40.00 -Educated regarding health benefits of weight loss- for pain, general health, chronic disease prevention, immune health, mental health.  -Will monitor weight every visit.  -Continue Ro[place]ois tea daily. -  Discussed the benefits of intermittent fasting. Continue this. Discussed that 30 hour fasts are healthy.  -Discussed foods that can assist in weight loss: 1) leafy greens- high in fiber and nutrients 2) dark chocolate- improves metabolism (if prefer sweetened, best to sweeten with honey instead of sugar).  3) cruciferous vegetables- high in fiber and protein 4) full fat yogurt: high in healthy fat, protein, calcium, and probiotics 5) apples- high in a variety of phytochemicals 6) nuts- high in fiber and protein that increase feelings of fullness 7) grapefruit: rich in nutrients, antioxidants, and fiber (not to be taken with  anticoagulation) 8) beans- high in protein and fiber 9) salmon- has high quality protein and healthy fats 10) green tea- rich in polyphenols 11) eggs- rich in choline and vitamin D 12) tuna- high protein, boosts metabolism 13) avocado- decreases visceral abdominal fat 14) chicken (pasture raised): high in protein and iron 15) blueberries- reduce abdominal fat and cholesterol 16) whole grains- decreases calories retained during digestion, speeds metabolism 17) chia seeds- curb appetite 18) chilies- increases fat metabolism  -Discussed supplements that can be used:  1) Metatrim 400mg  BID 30 minutes before breakfast and dinner  2) Sphaeranthus indicus and Garcinia mangostana (combinations of these and #1 can be found in capsicum and zychrome  3) green coffee bean extract 400mg  twice per day or Irvingia (african mango) 150 to 300mg  twice per day. Teas: Green tea Black tea Oolong tea White tea [place] of Tea, [place] Continue to avoid added sugar. Continue using honey or monk fruit as sweeteners.   7. Status Post Cervical Spinal Fusion: C5C6- C-6- C-7 Anterior Cervical Discectomy Fusion and Allograft Plate: Dr. Ophelia Charter Following. 05/25/2021  8. Cervicalgia/ Cervical Radiculitis/ S/P Cervical Spinal Fusion:  Continue to Monitor Dr. Ophelia Charter Following. 05/25/2021  9. 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 05/25/2021.  11.Right   Greater Trochanteric Bursitis: . Continue to Alternate Ice and heat Therapy. Continue to Monitor. 05/25/2021.  12. Chronic Bilateral Knee Pain: No Complaints Today. Continue HEP as Tolerated. Continue to Monitor. 05/25/2021  13. Lateral Epicondylitis of Right Elbow: No complaints today.  Ortho Following. S/P   06/26/2020 Right lateral epicondyle debridement, drilling and repair  By Dr Ophelia Charter. 14. Neuralgia/ Right Facial Droop: Neurology following. Continue to monitor.  05/25/2021.  15. Polyarthralgia: No complaints today. Continue HEP as tolerated. Continue to monitor.   16. Nausea: discussed trying an elimination diet avoiding gluten, dairy, and eggs for 4 weeks.   17. Right lower extremity pain: vascular ultrasound ordered to assess for clot.   18. Abdominal cramps -recommended saffron -recommended to follow-up with OB/GYN  19. Rib pain: -recommended trying lidocaine patch  20. Left knee: potential meniscal tear: discusses getting MRI -discussed benefits of infrared light therapy.  -discussed response to steroid shot- was quite painful but benefitted her afterward.    21. Brain Fog -discussed speech therapy -discussed her full 6 hour neuropsych testing at [facility] and was told overall she did well but there were some domains in which she stayed low -continue CPAP -continue to stay well hydrated.  -discussed low dose naltrexone  22. Interstitial cystitis -discussed that UA is normal, discussed medication treatment options for interstitial cystitis and sent her a link with more information. -discussed pentosan polysulfate sodium as a treatment to help restore the inner surface of the bladder.   23) MTHFR deficiency -discussed homocysteine level is 7.3 -commended on healthy lifestyle habits -made goal to eat leafy green  vegetables  24) Mixed hyperlipidemia: -check homocysteine level  25) Trigeminal neuralgia -discussed carbamazepine but she defers due to side effects in the past.   26. Cervical disc diseases Prescribing Home Zynex NexWave Stimulator Device and supplies as needed. IFC, NMES and TENS medically necessary Treatment Rx: Daily @ 30-40 minutes per treatment PRN. Zynex NexWave only, no substitutions. Treatment Goals: 1) To reduce and/or eliminate pain 2) To improve functional capacity and Activities of daily living 3) To reduce or prevent the need for oral medications 4) To improve circulation in the injured region 5) To decrease or  prevent muscle spasm and muscle atrophy 6) To provide a self-management tool to the patient The patient has not sufficiently improved with conservative care. Numerous studies indexed by Medline and PubMed.gov have shown Neuromuscular, Interferential, and TENS stimulators to reduce pain, improve function, and reduce medication use in injured patients. Continued use of this evidence based, safe, drug free treatment is both reasonable and medically necessary at this time.   27) Achilles tendinopathy Discussed extracorporeal shockwave therapy as a modality for treatment. Discussed that the device looks and feels like a massage gun and I would move it over the area of pain for about 10 minutes. The device releases sound waves to the area of pain and helps to improve blood flow and circulation to improve the healing process. Discuss that this initially induces inflammation and can sometimes cause short-term increase in pain. Discussed that we typically do three weekly treatments, but sometimes up to 6 if needed, and after 6 weeks long term benefits can sometimes be achieved. Discussed that this is an FDA approved device, but not covered by insurance and would cost $60 per session. Will scheduled patient for 6 consecutive appointments and can cancel latter three if benefits are achieved after first three sessions.     25 minutes spent in discussion of her cervical disc disease, that she does not think she can have surgery at this time as she has to care for her mother and son, that physical therapy is helping, that she is interested in a Nexwave/cervical traction device

## 2023-05-30 NOTE — Patient Instructions (Signed)
Subjective:    Patient ID: Karina Greer, female    DOB: Dec 01, 1965, 57 y.o.   MRN: [id number]  An audio/video tele-health visit is felt to be the most appropriate encounter for this patient at this time. This is a follow up tele-visit via phone. The patient is at home. MD is at office. Prior to scheduling this appointment, our staff discussed the limitations of evaluation and management by telemedicine and the availability of in-person appointments. The patient expressed understanding and agreed to proceed.   Karina Greer is a 57 y.o. female who presents for f/u of fibromyalgia and interstitial cystitis.   1) Interstitial cystitis -she has been having burning when she urinates -she got UA done yesterday, discussed results with her today  2) Diffuse chronic pain: back pain and diffuse in joints and muscles -her back has been more stiff recently -cold weather worsens her symptoms -she tried naltrexone, she felt like it made her sick, she feels nauseous every day so it wasn't that- it was more flulike symptoms. This was with the '1mg'$  of naltrexone. She did not take opioid that day.  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 Sometimes she does not eat until lunch. She has been trying to do the fasting we discussed last visit.  -Her son is 278 pounds and 6 foot tall. He went to 242 lbs following a similar diet.  -Her dietary goals are to make use of what she eats.  -she has been eating a lot of salads.  -she cannot handle hard  cheeses.  -she has been having a problem eating hamburger.  -Last night she made four pieces of chicken and put on some tomato sauce and red peppers, she sprinkled some cheese, added broccoli, olive oil, garlic, butter.  -she is planned to have her HgbA1c and lipid panel drawn by her PCP next week.  -has been having low back pain, MRI ordered and shows stable spondylosis from prior -she feels better when there is no food in her stomach -she got organic oranges from a farm and is going to make her own orange juice -she bought the Bioptemizer's magnesium so will start this soon -she is having a lot of pain in her neck and feels this is from the weather and her surgery -her calf is hurting a lot. She has not had an ultrasound. -blood clots run in her family.   -she bought [employer], Engineering geologist, magnesium breakthrough, Krill oil, Spirulina, Coenzyme Q10 -she saw an endocrinologist and she did a swab test -she went today for bloodwork to check her potassium, cortisol to look for Cushing's Disease -she feels like her hormones are off balance.  -she feels that she has all the symptoms of Cushing's Disease -she was told her rheum panel is normal.  -she was told she has a meniscal tear by ortho   3) Trigeminal neuralgia bilateral: -she said a procedure on her left side with good effect -she tried carbamezepine and it made  her stare into outer space  4) Obesity -she is down to 225 lbs!  5) MTHFR mutation -discussed that homocysteine level is 7.3, which is in the normal range -she tries to eat healthy -she does not smoke or use alcohol  Pain Inventory Average Pain 8 Pain Right Now 6 My pain is constant, sharp, burning, dull, stabbing, and tingling  In the last 24 hours, has pain interfered with the following? General activity 5 Relation with others 5 Enjoyment of life 3 What TIME of day is your pain at its worst? morning , daytime, and night Sleep (in general) Poor  Pain is worse with:  walking, bending, sitting, inactivity, standing, unsure, and some activites Pain improves with: rest, heat/ice, therapy/exercise, pacing activities, medication, TENS, and injections Relief from Meds: 7  Family History  Problem Relation Age of Onset   Hypertension Mother    Diabetes Father    Brain cancer Father    Clotting disorder Father        blood clots   Fibromyalgia Sister    Suicidality Sister    Hypertension Other        Cancer, Cerebrovascular disease run on mother side of family   Social History   Socioeconomic History   Marital status: Divorced    Spouse name: Not on file   Number of children: 3   Years of education: HS   Highest education level: 12th grade  Occupational History   Occupation: Merchandiser, retail: UNEMPLOYED    Comment: Disability  Tobacco Use   Smoking status: Never   Smokeless tobacco: Never  Vaping Use   Vaping Use: Never used  Substance and Sexual Activity   Alcohol use: No   Drug use: No   Sexual activity: Not on file  Other Topics Concern   Not on file  Social History Narrative   Patient is right-handed. She avoids caffeine. She has recently been using the treadmill.   Social Determinants of Health   Financial Resource Strain: Not on file  Food Insecurity: Not on file  Transportation Needs: Not on file  Physical Activity: Not on file  Stress: Not on file  Social Connections: Not on file   Past Surgical History:  Procedure Laterality Date   ACHILLES TENDON SURGERY Right 3/16   ANTERIOR CERVICAL DECOMP/DISCECTOMY FUSION N/A 03/11/2016   Procedure: C5-6, C6-7 Anterior Cervical Discectomy and Fusion, Allograft, Plate;  Surgeon: Marybelle Killings, MD;  Location: [place];  Service: Orthopedics;  Laterality: N/A;   CHOLECYSTECTOMY  2014   COLONOSCOPY WITH PROPOFOL N/A 08/25/2017   Procedure: COLONOSCOPY WITH PROPOFOL;  Surgeon: Manya Silvas, MD;  Location: [facility] ENDOSCOPY;  Service: Endoscopy;  Laterality: N/A;   CYSTO WITH  HYDRODISTENSION N/A 01/26/2014   Procedure: CYSTOSCOPY/HYDRODISTENSION;  Surgeon: Bernestine Amass, MD;  Location: [facility];  Service: Urology;  Laterality: N/A;   CYSTO/  URETHRAL DILATION/  HYDRODISTENTION/   INSTILLATION THERAPY  07-16-2010//   12-30-2007//   10-27-2006   ELBOW SURGERY Right    EXERCISE TOLERENCE TEST  10-12-2010   NEGATIVE  ADEQUATE ETT/  NO ISCHEMIA OR EVIDENCE HIGH GRADE OBSTRUCTIVE CAD/  NO FURTHER TEST NEEDED   HYSTEROSCOPY W/  NOVASURE ENDOMETRIAL ABLATION  2014   LAPAROSCOPIC OVARIAN CYST BX  2005   AND  URETEROSCOPIC LASER LITHO  STONE EXTRACTION   SHOULDER OPEN ROTATOR CUFF REPAIR Right 2004   TONSILLECTOMY     TRANSTHORACIC ECHOCARDIOGRAM  06-07-2006   normal study/  ef 60-65%  TUBAL LIGATION Bilateral 1995   Past Surgical History:  Procedure Laterality Date   ACHILLES TENDON SURGERY Right 3/16   ANTERIOR CERVICAL DECOMP/DISCECTOMY FUSION N/A 03/11/2016   Procedure: C5-6, C6-7 Anterior Cervical Discectomy and Fusion, Allograft, Plate;  Surgeon: Marybelle Killings, MD;  Location: [place];  Service: Orthopedics;  Laterality: N/A;   CHOLECYSTECTOMY  2014   COLONOSCOPY WITH PROPOFOL N/A 08/25/2017   Procedure: COLONOSCOPY WITH PROPOFOL;  Surgeon: Manya Silvas, MD;  Location: [facility] ENDOSCOPY;  Service: Endoscopy;  Laterality: N/A;   CYSTO WITH HYDRODISTENSION N/A 01/26/2014   Procedure: CYSTOSCOPY/HYDRODISTENSION;  Surgeon: Bernestine Amass, MD;  Location: [facility];  Service: Urology;  Laterality: N/A;   CYSTO/  URETHRAL DILATION/  HYDRODISTENTION/   INSTILLATION THERAPY  07-16-2010//   12-30-2007//   10-27-2006   ELBOW SURGERY Right    EXERCISE TOLERENCE TEST  10-12-2010   NEGATIVE  ADEQUATE ETT/  NO ISCHEMIA OR EVIDENCE HIGH GRADE OBSTRUCTIVE CAD/  NO FURTHER TEST NEEDED   HYSTEROSCOPY W/  [place] ENDOMETRIAL ABLATION  2014   LAPAROSCOPIC OVARIAN CYST BX  2005   AND  URETEROSCOPIC LASER LITHO  STONE EXTRACTION   SHOULDER OPEN ROTATOR  CUFF REPAIR Right 2004   TONSILLECTOMY     TRANSTHORACIC ECHOCARDIOGRAM  06-07-2006   normal study/  ef 60-65%   TUBAL LIGATION Bilateral 1995   Past Medical History:  Diagnosis Date   Anxiety    Anxiety disorder    Arthritis    Blepharitis    Chronic low back pain    Chronic pain    Depression    Diabetes mellitus without complication (HCC)    Dyslipidemia    Family history of blood clots    Fibromyalgia    GERD (gastroesophageal reflux disease)    Headache    hx migraines   History of kidney stones    History of panic attacks    History of renal calculi    IBS (irritable bowel syndrome)    Interstitial cystitis    Lupus ([place])    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   There were no vitals taken for this visit.  Opioid Risk Score:   Fall Risk Score:  `1  Depression screen [facility] 2/9     09/03/2022   12:46 PM 07/30/2022   10:21 AM 07/01/2022   10:02 AM 05/08/2022    9:58 AM 04/10/2022    9:35 AM 03/13/2022    9:52 AM 02/26/2022    9:25 AM  Depression screen PHQ 2/9  Decreased Interest 0 1 0 0 0 0 0  Down, Depressed, Hopeless 0 1 0 0 0 0 0  PHQ - 2 Score 0 2 0 0 0 0 0    Review of Systems  Musculoskeletal:  Positive for arthralgias and back pain.       Pain all over the body  All other systems reviewed and are negative.      Objective:   Physical  Exam Gen: no distress, normal appearing, weight 229 lbs, BMI 40.57 HEENT: oral mucosa pink and moist, NCAT Cardio: Reg rate Chest: normal effort, normal rate of breathing Abd: soft, non-distended Ext: no edema Psych:  pleasant, normal affect Skin: intact Neuro: Alert and oriented x3    Assessment & Plan:  1. Lumbago/ Lumbar Spondylosis/ Left Lumbar Radiculitis: Continue to Monitor. 09/02/202. -Refilled HYDROcodone 10/'325mg'$  one tablet every 8 hours as needed #90. Discussed that MRI results show spondylosis that is stable from prior. Continue using muscle rub. Refilled Lidocaine patches.   -discussed weaning off if she can tolerate.  -discussed risks and benefits of bracing. Prescribed lumboscaral brace with the goal of reducing pain medication use and improving mobility.   -prescribed Zynex Tens unit  -discussed mechanism of action of low dose naltrexone as an opioid receptor antagonist which stimulates your body's production of its own natural endogenous opioids, helping to decrease pain. Discussed that it can also decrease T cell response and thus be helpful in decreasing inflammation, and symptoms of brain fog, fatigue, anxiety, depression, and allergies. Discussed that this medication needs to be compounded at a compounding pharmacy and can more expensive. Discussed that I usually start at '1mg'$  and if this is not providing enough relief then I titrate upward on a monthly basis.    -UDS today  -Discussed her worsening pain with the cold weather  -refilled lidocaine patches  2. Fibromyalgia. Continue Current exercise Regime. 05/25/2021. Discussed aqua therapy.  -recommended infrared light therapy -ordered Zynex Medical heating blanket for 90 days, discussed the benefits of heat in improving blood circulation.  -discussed use of TENs unit  3. Anxiety and depression:Psychiatry Following: Counselor Jessica  at the [facility] . Continue Counseling at [facility]. 05/25/2021  4. Migraines: On Maxalt. Neurology Following. 05/25/2021. Start Bioptemizers magnesium.   5. OSA : PCP Following. Continue to Monitor. 05/25/2021  6. Obesity:   -commended on 6 lbs loss! -continue to avoid bread -check  vitamin D level today.  -continue 2 TB of saurkraut every morning  -recommended to use her krill oil, Vitamin D, [place], magnesium breakthrough  -encouraged dandelion tea  -discussed wormwood/walnut oil cleanse -recommended nattokinase -encouraged endocrinology follow-up -counseled regarding the benefits of intermittent fasting and improving insulin sensitivity. -Educated that current weight is 225 lbs and current BMI is 40.00 -Educated regarding health benefits of weight loss- for pain, general health, chronic disease prevention, immune health, mental health.  -Will monitor weight every visit.  -Continue Ro[place]ois tea daily. -Discussed the benefits of intermittent fasting. Continue this. Discussed that 30 hour fasts are healthy.  -Discussed foods that can assist in weight loss: 1) leafy greens- high in fiber and nutrients 2) dark chocolate- improves metabolism (if prefer sweetened, best to sweeten with honey instead of sugar).  3) cruciferous vegetables- high in fiber and protein 4) full fat yogurt: high in healthy fat, protein, calcium, and probiotics 5) apples- high in a variety of phytochemicals 6) nuts- high in fiber and protein that increase feelings of fullness 7) grapefruit: rich in nutrients, antioxidants, and fiber (not to be taken with anticoagulation) 8) beans- high in protein and fiber 9) salmon- has high quality protein and healthy fats 10) green tea- rich in polyphenols 11) eggs- rich in choline and vitamin D 12) tuna- high protein, boosts metabolism 13) avocado- decreases visceral abdominal fat 14) chicken (pasture raised): high in protein and iron 15) blueberries- reduce abdominal fat and cholesterol 16) whole grains- decreases calories retained during digestion, speeds metabolism  17) chia seeds- curb appetite 18) chilies- increases fat metabolism  -Discussed supplements that can be used:  1) Metatrim '400mg'$  BID 30 minutes before breakfast and dinner  2) Sphaeranthus  indicus and Garcinia mangostana (combinations of these and #1 can be found in capsicum and zychrome  3) green coffee bean extract '400mg'$  twice per day or Irvingia (african mango) 150 to '300mg'$  twice per day. Teas: Green tea Black tea Oolong tea White tea [place] of Tea, [place] Continue to avoid added sugar. Continue using honey or monk fruit as sweeteners.   7. Status Post Cervical Spinal Fusion: C5C6- C-6- C-7 Anterior Cervical Discectomy Fusion and Allograft Plate: Dr. Lorin Mercy Following. 05/25/2021  8. Cervicalgia/ Cervical Radiculitis/ S/P Cervical Spinal Fusion:  Continue to Monitor Dr. Lorin Mercy Following. 05/25/2021  9. 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 05/25/2021.  11.Right   Greater Trochanteric Bursitis: . Continue to Alternate Ice and heat Therapy. Continue to Monitor. 05/25/2021.  12. Chronic Bilateral Knee Pain: No Complaints Today. Continue HEP as Tolerated. Continue to Monitor. 05/25/2021  13. Lateral Epicondylitis of Right Elbow: No complaints today.  Ortho Following. S/P   06/26/2020 Right lateral epicondyle debridement, drilling and repair  By Dr Lorin Mercy. 14. Neuralgia/ Right Facial Droop: Neurology following. Continue to monitor. 05/25/2021.  15. Polyarthralgia: No complaints today. Continue HEP as tolerated. Continue to monitor.   16. Nausea: discussed trying an elimination diet avoiding gluten, dairy, and eggs for 4 weeks.   17. Right lower extremity pain: vascular ultrasound ordered to assess for clot.   18. Abdominal cramps -recommended saffron -recommended to follow-up with OB/GYN  19. Rib pain: -recommended trying lidocaine patch  20. Left knee: potential meniscal tear: discusses getting MRI -discussed benefits of infrared light therapy.  -discussed response to steroid shot- was quite painful but benefitted her afterward.    21. Brain Fog -discussed speech  therapy -discussed her full 6 hour neuropsych testing at [facility] and was told overall she did well but there were some domains in which she stayed low -continue CPAP -continue to stay well hydrated.  -discussed low dose naltrexone  22. Interstitial cystitis -discussed that UA is normal, discussed medication treatment options for interstitial cystitis and sent her a link with more information. -discussed pentosan polysulfate sodium as a treatment to help restore the inner surface of the bladder.   23) MTHFR deficiency -discussed homocysteine level is 7.3 -commended on healthy lifestyle habits -made goal to eat leafy green vegetables  24) Mixed hyperlipidemia: -check homocysteine level  25) Trigeminal neuralgia -discussed carbamazepine but she defers due to side effects in the past.    6 minutes spent in discussion that homocysteine level is 7.3, commended on healthy lifestyle habits

## 2023-06-02 ENCOUNTER — Other Ambulatory Visit: Payer: Self-pay

## 2023-06-04 ENCOUNTER — Ambulatory Visit: Payer: Medicare Other | Admitting: Physical Therapy

## 2023-06-04 ENCOUNTER — Ambulatory Visit
Admission: RE | Admit: 2023-06-04 | Discharge: 2023-06-04 | Disposition: A | Discharge status: Home / Self Care | Payer: Medicare Other | Source: Ambulatory Visit | Attending: Podiatry | Admitting: Podiatry

## 2023-06-04 DIAGNOSIS — M7661 Achilles tendinitis, right leg: Secondary | ICD-10-CM

## 2023-06-04 DIAGNOSIS — M71571 Other bursitis, not elsewhere classified, right ankle and foot: Secondary | ICD-10-CM | POA: Diagnosis not present

## 2023-06-04 DIAGNOSIS — M67873 Other specified disorders of tendon, right ankle and foot: Secondary | ICD-10-CM | POA: Diagnosis not present

## 2023-06-04 DIAGNOSIS — T148XXA Other injury of unspecified body region, initial encounter: Secondary | ICD-10-CM

## 2023-06-09 DIAGNOSIS — E559 Vitamin D deficiency, unspecified: Secondary | ICD-10-CM | POA: Diagnosis not present

## 2023-06-09 DIAGNOSIS — E119 Type 2 diabetes mellitus without complications: Secondary | ICD-10-CM | POA: Diagnosis not present

## 2023-06-09 DIAGNOSIS — G43009 Migraine without aura, not intractable, without status migrainosus: Secondary | ICD-10-CM | POA: Diagnosis not present

## 2023-06-09 DIAGNOSIS — I700 Atherosclerosis of aorta: Secondary | ICD-10-CM | POA: Diagnosis not present

## 2023-06-09 DIAGNOSIS — M810 Age-related osteoporosis without current pathological fracture: Secondary | ICD-10-CM | POA: Diagnosis not present

## 2023-06-09 DIAGNOSIS — H6990 Unspecified Eustachian tube disorder, unspecified ear: Secondary | ICD-10-CM | POA: Diagnosis not present

## 2023-06-09 DIAGNOSIS — Z79899 Other long term (current) drug therapy: Secondary | ICD-10-CM | POA: Diagnosis not present

## 2023-06-09 DIAGNOSIS — I708 Atherosclerosis of other arteries: Secondary | ICD-10-CM | POA: Diagnosis not present

## 2023-06-09 DIAGNOSIS — K219 Gastro-esophageal reflux disease without esophagitis: Secondary | ICD-10-CM | POA: Diagnosis not present

## 2023-06-09 DIAGNOSIS — E7800 Pure hypercholesterolemia, unspecified: Secondary | ICD-10-CM | POA: Diagnosis not present

## 2023-06-10 ENCOUNTER — Ambulatory Visit: Payer: Medicare Other | Admitting: Orthopaedic Surgery

## 2023-06-10 ENCOUNTER — Encounter: Payer: Self-pay | Admitting: Physical Medicine and Rehabilitation

## 2023-06-11 ENCOUNTER — Encounter (HOSPITAL_BASED_OUTPATIENT_CLINIC_OR_DEPARTMENT_OTHER): Payer: Medicare Other | Admitting: Physical Medicine and Rehabilitation

## 2023-06-11 ENCOUNTER — Other Ambulatory Visit: Payer: Self-pay | Admitting: Physical Medicine and Rehabilitation

## 2023-06-11 ENCOUNTER — Other Ambulatory Visit: Payer: Self-pay

## 2023-06-11 DIAGNOSIS — M7660 Achilles tendinitis, unspecified leg: Secondary | ICD-10-CM | POA: Diagnosis not present

## 2023-06-11 NOTE — Progress Notes (Signed)
Subjective:    Patient ID: Karina Greer, female    DOB: April 27, 1966, 57 y.o.   MRN: [id number]  An audio/video tele-health visit is felt to be the most appropriate encounter for this patient at this time. This is a follow up tele-visit via phone. The patient is at home. MD is at office. Prior to scheduling this appointment, our staff discussed the limitations of evaluation and management by telemedicine and the availability of in-person appointments. The patient expressed understanding and agreed to proceed.   Karina Greer is a 57 y.o. female who presents for f/u of fibromyalgia and interstitial cystitis.   1) Interstitial cystitis -she has been having burning when she urinates -she got UA done yesterday, discussed results with her today  2) Diffuse chronic pain: back pain and diffuse in joints and muscles -her back has been more stiff recently -cold weather worsens her symptoms -she tried naltrexone, she felt like it made her sick, she feels nauseous every day so it wasn't that- it was more flulike symptoms. This was with the 1mg  of naltrexone. She did not take opioid that day.  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 Sometimes she does not eat until lunch. She has been trying to do the fasting we discussed last visit.  -Her son is 278 pounds and 6 foot tall. He went to 242 lbs following a similar diet.  -Her dietary goals are to make use of what she eats.  -she has been eating a lot of salads.  -she cannot handle hard  cheeses.  -she has been having a problem eating hamburger.  -Last night she made four pieces of chicken and put on some tomato sauce and red peppers, she sprinkled some cheese, added broccoli, olive oil, garlic, butter.  -she is planned to have her HgbA1c and lipid panel drawn by her PCP next week.  -has been having low back pain, MRI ordered and shows stable spondylosis from prior -she feels better when there is no food in her stomach -she got organic oranges from a farm and is going to make her own orange juice -she bought the Bioptemizer's magnesium so will start this soon -she is having a lot of pain in her neck and feels this is from the weather and her surgery -her calf is hurting a lot. She has not had an ultrasound. -blood clots run in her family.   -she bought [place] [place], Advice worker, magnesium breakthrough, Krill oil, Spirulina, Coenzyme Q10 -she saw an endocrinologist and she did a swab test -she went today for bloodwork to check her potassium, cortisol to look for Cushing's Disease -she feels like her hormones are off balance.  -she feels that she has all the symptoms of Cushing's Disease -she was told her rheum panel is normal.  -she was told she has a meniscal tear by ortho   3) Trigeminal neuralgia bilateral: -she said a procedure on her left side with good effect -she tried carbamezepine and it made  her stare into outer space  4) Obesity -she is down to 225 lbs!  5) MTHFR mutation -discussed that homocysteine level is 7.3, which is in the normal range -she tries to eat healthy -she does not smoke or use alcohol  6) Cervical disc disease -she was told by Dr. Ophelia Charter that she will need surgery but she doesn't think she can do this at this time as she is taking care of both her son and mother.  -she is doing PT and this seems to help -she would be interested in trying a tens unit and a cervical traction device  7) Achilles tendinopathy -she noted in her provider notes that  she also has a tear and she asks if it is still ok for her to get ECSWT  Pain Inventory Average Pain 8 Pain Right Now 6 My pain is constant, sharp, burning, dull, stabbing, and tingling  In the last 24 hours, has pain interfered with the following? General activity 5 Relation with others 5 Enjoyment of life 3 What TIME of day is your pain at its worst? morning , daytime, and night Sleep (in general) Poor  Pain is worse with: walking, bending, sitting, inactivity, standing, unsure, and some activites Pain improves with: rest, heat/ice, therapy/exercise, pacing activities, medication, TENS, and injections Relief from Meds: 7  Family History  Problem Relation Age of Onset   Hypertension Mother    Diabetes Father    Brain cancer Father    Clotting disorder Father        blood clots   Fibromyalgia Sister    Suicidality Sister    Hypertension Other        Cancer, Cerebrovascular disease run on mother side of family   Social History   Socioeconomic History   Marital status: Divorced    Spouse name: Not on file   Number of children: 3   Years of education: HS   Highest education level: 12th grade  Occupational History   Occupation: Designer, industrial/product: UNEMPLOYED    Comment: Disability  Tobacco Use   Smoking status: Never   Smokeless tobacco: Never  Vaping Use   Vaping status: Never Used  Substance and Sexual Activity   Alcohol use: No   Drug use: No   Sexual activity: Not on file  Other Topics Concern   Not on file  Social History Narrative   Patient is right-handed. She avoids caffeine. She has recently been using the treadmill.   Social Determinants of Health   Financial Resource Strain: Not on file  Food Insecurity: Not on file  Transportation Needs: Not on file  Physical Activity: Not on file  Stress: Not on file  Social Connections: Not on file   Past Surgical History:  Procedure Laterality Date   ACHILLES TENDON SURGERY Right 3/16   ANTERIOR  CERVICAL DECOMP/DISCECTOMY FUSION N/A 03/11/2016   Procedure: C5-6, C6-7 Anterior Cervical Discectomy and Fusion, Allograft, Plate;  Surgeon: Eldred Manges, MD;  Location: [facility] OR;  Service: Orthopedics;  Laterality: N/A;   CHOLECYSTECTOMY  2014   COLONOSCOPY WITH PROPOFOL N/A 08/25/2017   Procedure: COLONOSCOPY WITH PROPOFOL;  Surgeon: Scot Jun, MD;  [facility] ENDOSCOPY;  Service: Endoscopy;  Laterality: N/A;   CYSTO WITH HYDRODISTENSION N/A 01/26/2014   Procedure: CYSTOSCOPY/HYDRODISTENSION;  Surgeon: [name], MD;  Location: [facility];  Service: Urology;  Laterality: N/A;   CYSTO/  URETHRAL DILATION/  HYDRODISTENTION/   INSTILLATION THERAPY  07-16-2010//   12-30-2007//  10-27-2006   ELBOW SURGERY Right    EXERCISE TOLERENCE TEST  10-12-2010   NEGATIVE  ADEQUATE ETT/  NO ISCHEMIA OR EVIDENCE HIGH GRADE OBSTRUCTIVE CAD/  NO FURTHER TEST NEEDED   HYSTEROSCOPY W/  NOVASURE ENDOMETRIAL ABLATION  2014   LAPAROSCOPIC OVARIAN CYST BX  2005   AND  URETEROSCOPIC LASER LITHO  STONE EXTRACTION   SHOULDER OPEN ROTATOR CUFF REPAIR Right 2004   TONSILLECTOMY     TRANSTHORACIC ECHOCARDIOGRAM  06-07-2006   normal study/  ef 60-65%   TUBAL LIGATION Bilateral 1995   Past Surgical History:  Procedure Laterality Date   ACHILLES TENDON SURGERY Right 3/16   ANTERIOR CERVICAL DECOMP/DISCECTOMY FUSION N/A 03/11/2016   Procedure: C5-6, C6-7 Anterior Cervical Discectomy and Fusion, Allograft, Plate;  Surgeon: Eldred Manges, MD;  Location: [facility] OR;  Service: Orthopedics;  Laterality: N/A;   CHOLECYSTECTOMY  2014   COLONOSCOPY WITH PROPOFOL N/A 08/25/2017   Procedure: COLONOSCOPY WITH PROPOFOL;  Surgeon: Scot Jun, MD;  Location: [facility] ENDOSCOPY;  Service: Endoscopy;  Laterality: N/A;   CYSTO WITH HYDRODISTENSION N/A 01/26/2014   Procedure: CYSTOSCOPY/HYDRODISTENSION;  Surgeon: [name], MD;  Location: [facility];  Service: Urology;  Laterality: N/A;   CYSTO/   URETHRAL DILATION/  HYDRODISTENTION/   INSTILLATION THERAPY  07-16-2010//   12-30-2007//   10-27-2006   ELBOW SURGERY Right    EXERCISE TOLERENCE TEST  10-12-2010   NEGATIVE  ADEQUATE ETT/  NO ISCHEMIA OR EVIDENCE HIGH GRADE OBSTRUCTIVE CAD/  NO FURTHER TEST NEEDED   HYSTEROSCOPY W/  NOVASURE ENDOMETRIAL ABLATION  2014   LAPAROSCOPIC OVARIAN CYST BX  2005   AND  URETEROSCOPIC LASER LITHO  STONE EXTRACTION   SHOULDER OPEN ROTATOR CUFF REPAIR Right 2004   TONSILLECTOMY     TRANSTHORACIC ECHOCARDIOGRAM  06-07-2006   normal study/  ef 60-65%   TUBAL LIGATION Bilateral 1995   Past Medical History:  Diagnosis Date   Anxiety    Anxiety disorder    Arthritis    Blepharitis    Chronic low back pain    Chronic pain    Depression    Diabetes mellitus without complication (HCC)    Dyslipidemia    Family history of blood clots    Fibromyalgia    GERD (gastroesophageal reflux disease)    Headache    hx migraines   History of kidney stones    History of panic attacks    History of renal calculi    IBS (irritable bowel syndrome)    Interstitial cystitis    Lupus (HCC)    tested positive for antibodies for lupus. 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   There were no vitals taken for this visit.  Opioid Risk Score:   Fall Risk Score:  `1  Depression screen [facility] 2/9     05/14/2023    9:07 AM 02/24/2023    8:38 AM 01/24/2023    9:02 AM 12/27/2022    8:42 AM 11/29/2022   10:37 AM  10/04/2022    8:29 AM 09/27/2022    9:50 AM  Depression screen PHQ 2/9  Decreased Interest 0 0 0 0 0  0  Down, Depressed, Hopeless 0 0 0 0 0  0  PHQ - 2  Score 0 0 0 0 0  0  Altered sleeping         Tired, decreased energy         Change in appetite         Feeling bad or failure about yourself          Trouble concentrating         Moving slowly or fidgety/restless         Suicidal thoughts         PHQ-9 Score            Information is confidential and restricted. Go to Review Flowsheets to unlock data.    Review of Systems  Musculoskeletal:  Positive for arthralgias and back pain.       Pain all over the body  All other systems reviewed and are negative.      Objective:   Physical Exam Gen: no distress, normal appearing, weight 229 lbs, BMI 40.57 HEENT: oral mucosa pink and moist, NCAT Cardio: Reg rate Chest: normal effort, normal rate of breathing Abd: soft, non-distended Ext: no edema Psych: pleasant, normal affect Skin: intact Neuro: Alert and oriented x3    Assessment & Plan:  1. Lumbago/ Lumbar Spondylosis/ Left Lumbar Radiculitis: Continue to Monitor. 09/02/202. -Refilled HYDROcodone 10/325mg  one tablet every 8 hours as needed #90. Discussed that MRI results show spondylosis that is stable from prior. Continue using muscle rub. Refilled Lidocaine patches.   -discussed weaning off if she can tolerate.  -discussed risks and benefits of bracing. Prescribed lumboscaral brace with the goal of reducing pain medication use and improving mobility.   -prescribed Zynex Tens unit  -discussed mechanism of action of low dose naltrexone as an opioid receptor antagonist which stimulates your body's production of its own natural endogenous opioids, helping to decrease pain. Discussed that it can also decrease T cell response and thus be helpful in decreasing inflammation, and symptoms of brain fog, fatigue, anxiety, depression, and allergies. Discussed that this medication needs to be compounded at a compounding pharmacy and can more expensive. Discussed that I usually start at 1mg  and if this is not providing enough relief then I  titrate upward on a monthly basis.    -UDS monitoring routinely  -Discussed her worsening pain with the cold weather  -refilled lidocaine patches  2. Fibromyalgia. Continue Current exercise Regime. 05/25/2021. Discussed aqua therapy.  -recommended infrared light therapy -ordered Zynex Medical heating blanket for 90 days, discussed the benefits of heat in improving blood circulation.  -discussed use of TENs unit  3. Anxiety and depression:Psychiatry Following: Counselor Jessica  at [facility] . Continue Counseling at [facility]. 05/25/2021  4. Migraines: On Maxalt. Neurology Following. 05/25/2021. Start Bioptemizers magnesium.   5. OSA : PCP Following. Continue to Monitor. 05/25/2021  6. Obesity:   -commended on 6 lbs loss! -continue to avoid bread -check vitamin D level today.  -continue 2 TB of saurkraut every morning  -recommended to use her krill oil, Vitamin D, [place], magnesium breakthrough  -encouraged dandelion tea  -discussed wormwood/walnut oil cleanse -recommended nattokinase -encouraged endocrinology follow-up -counseled regarding the benefits of intermittent fasting and improving insulin sensitivity. -Educated that current weight is 225 lbs and current BMI is 40.00 -Educated  regarding health benefits of weight loss- for pain, general health, chronic disease prevention, immune health, mental health.  -Will monitor weight every visit.  -Continue Ro[place]ois tea daily. -Discussed the benefits of intermittent fasting. Continue this. Discussed that 30 hour fasts are healthy.  -Discussed foods that can assist in weight loss: 1) leafy greens- high in fiber and nutrients 2) dark chocolate- improves metabolism (if prefer sweetened, best to sweeten with honey instead of sugar).  3) cruciferous vegetables- high in fiber and protein 4) full fat yogurt: high in healthy fat, protein, calcium, and probiotics 5) apples- high in a variety of phytochemicals 6) nuts- high in  fiber and protein that increase feelings of fullness 7) grapefruit: rich in nutrients, antioxidants, and fiber (not to be taken with anticoagulation) 8) beans- high in protein and fiber 9) salmon- has high quality protein and healthy fats 10) green tea- rich in polyphenols 11) eggs- rich in choline and vitamin D 12) tuna- high protein, boosts metabolism 13) avocado- decreases visceral abdominal fat 14) chicken (pasture raised): high in protein and iron 15) blueberries- reduce abdominal fat and cholesterol 16) whole grains- decreases calories retained during digestion, speeds metabolism 17) chia seeds- curb appetite 18) chilies- increases fat metabolism  -Discussed supplements that can be used:  1) Metatrim 400mg  BID 30 minutes before breakfast and dinner  2) Sphaeranthus indicus and Garcinia mangostana (combinations of these and #1 can be found in capsicum and zychrome  3) green coffee bean extract 400mg  twice per day or Irvingia (african mango) 150 to 300mg  twice per day. Teas: Green tea Black tea Oolong tea White tea [place] Tea, [place] Continue to avoid added sugar. Continue using honey or monk fruit as sweeteners.   7. Status Post Cervical Spinal Fusion: C5C6- C-6- C-7 Anterior Cervical Discectomy Fusion and Allograft Plate: Dr. Ophelia Charter Following. 05/25/2021  8. Cervicalgia/ Cervical Radiculitis/ S/P Cervical Spinal Fusion:  Continue to Monitor Dr. Ophelia Charter Following. 05/25/2021  9. 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 05/25/2021.  11.Right   Greater Trochanteric Bursitis: . Continue to Alternate Ice and heat Therapy. Continue to Monitor. 05/25/2021.  12. Chronic Bilateral Knee Pain: No Complaints Today. Continue HEP as Tolerated. Continue to Monitor. 05/25/2021  13. Lateral Epicondylitis of Right Elbow: No complaints today.  Ortho Following. S/P   06/26/2020 Right lateral epicondyle  debridement, drilling and repair  By Dr Ophelia Charter. 14. Neuralgia/ Right Facial Droop: Neurology following. Continue to monitor. 05/25/2021.  15. Polyarthralgia: No complaints today. Continue HEP as tolerated. Continue to monitor.   16. Nausea: discussed trying an elimination diet avoiding gluten, dairy, and eggs for 4 weeks.   17. Right lower extremity pain: vascular ultrasound ordered to assess for clot.   18. Abdominal cramps -recommended saffron -recommended to follow-up with OB/GYN  19. Rib pain: -recommended trying lidocaine patch  20. Left knee: potential meniscal tear: discusses getting MRI -discussed benefits of infrared light therapy.  -discussed response to steroid shot- was quite painful but benefitted her afterward.    21. Brain Fog -discussed speech therapy -discussed her full 6 hour neuropsych testing at [facility] and was told overall she did well but there were some domains in which she stayed low -continue CPAP -continue to stay well hydrated.  -discussed low dose naltrexone  22. Interstitial cystitis -discussed that UA is normal, discussed medication treatment options for interstitial cystitis and sent her a link with more information. -discussed pentosan polysulfate sodium as a treatment to help  restore the inner surface of the bladder.   23) MTHFR deficiency -discussed homocysteine level is 7.3 -commended on healthy lifestyle habits -made goal to eat leafy green vegetables  24) Mixed hyperlipidemia: -check homocysteine level  25) Trigeminal neuralgia -discussed carbamazepine but she defers due to side effects in the past.   26. Cervical disc diseases Prescribing Home Zynex NexWave Stimulator Device and supplies as needed. IFC, NMES and TENS medically necessary Treatment Rx: Daily @ 30-40 minutes per treatment PRN. Zynex NexWave only, no substitutions. Treatment Goals: 1) To reduce and/or eliminate pain 2) To improve functional capacity and Activities of daily  living 3) To reduce or prevent the need for oral medications 4) To improve circulation in the injured region 5) To decrease or prevent muscle spasm and muscle atrophy 6) To provide a self-management tool to the patient The patient has not sufficiently improved with conservative care. Numerous studies indexed by Medline and PubMed.gov have shown Neuromuscular, Interferential, and TENS stimulators to reduce pain, improve function, and reduce medication use in injured patients. Continued use of this evidence based, safe, drug free treatment is both reasonable and medically necessary at this time.   27) Achilles tendinopathy/tear Discussed extracorporeal shockwave therapy as a modality for treatment. Discussed that the device looks and feels like a massage gun and I would move it over the area of pain for about 10 minutes. The device releases sound waves to the area of pain and helps to improve blood flow and circulation to improve the healing process. Discuss that this initially induces inflammation and can sometimes cause short-term increase in pain. Discussed that we typically do three weekly treatments, but sometimes up to 6 if needed, and after 6 weeks long term benefits can sometimes be achieved. Discussed that this is an FDA approved device, but not covered by insurance and would cost $60 per session. Will scheduled patient for 6 consecutive appointments and can cancel latter three if benefits are achieved after first three sessions.     7 minutes spent in discussion of achilles tendinopathy and tear and that it is ok for her to still get the extracorporeal shockwave therapy, discussed that the first week may be painful

## 2023-06-13 ENCOUNTER — Encounter: Payer: Medicare Other | Admitting: Physical Medicine and Rehabilitation

## 2023-06-13 VITALS — BP 107/75 | HR 65 | Ht 63.0 in | Wt 223.0 lb

## 2023-06-13 DIAGNOSIS — M7660 Achilles tendinitis, unspecified leg: Secondary | ICD-10-CM

## 2023-06-13 NOTE — Progress Notes (Signed)
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

## 2023-06-13 NOTE — Patient Instructions (Signed)
Extracoporeal shockwave therapy

## 2023-06-16 ENCOUNTER — Encounter: Payer: Medicare Other | Admitting: Registered Nurse

## 2023-06-16 ENCOUNTER — Encounter: Payer: Self-pay | Admitting: Physical Therapy

## 2023-06-16 ENCOUNTER — Ambulatory Visit: Payer: Medicare Other | Admitting: Physical Therapy

## 2023-06-16 DIAGNOSIS — R209 Unspecified disturbances of skin sensation: Secondary | ICD-10-CM | POA: Diagnosis not present

## 2023-06-16 DIAGNOSIS — M542 Cervicalgia: Secondary | ICD-10-CM | POA: Diagnosis not present

## 2023-06-16 DIAGNOSIS — M6281 Muscle weakness (generalized): Secondary | ICD-10-CM

## 2023-06-16 DIAGNOSIS — Z981 Arthrodesis status: Secondary | ICD-10-CM

## 2023-06-16 NOTE — Therapy (Signed)
OUTPATIENT PHYSICAL THERAPY TREATMENT   Patient Name: Karina Greer MRN: [id number] DOB:09-26-1965, 57 y.o., female Today's Date: 06/16/2023  END OF SESSION:  PT End of Session - 06/16/23 1425     Visit Number 7    Number of Visits 16    Date for PT Re-Evaluation 06/20/23    Authorization Type MCR, MCD [place]    PT Start Time 1420    PT Stop Time 1500    PT Time Calculation (min) 40 min    Activity Tolerance Patient tolerated treatment well    Behavior During Therapy WFL for tasks assessed/performed                 Past Medical History:  Diagnosis Date   Anxiety    Anxiety disorder    Arthritis    Blepharitis    Chronic low back pain    Chronic pain    Depression    Diabetes mellitus without complication (HCC)    Dyslipidemia    Family history of blood clots    Fibromyalgia    GERD (gastroesophageal reflux disease)    Headache    hx migraines   History of kidney stones    History of panic attacks    History of renal calculi    IBS (irritable bowel syndrome)    Interstitial cystitis    Lupus (HCC)    tested positive for antibodies for lupus. 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   Past Surgical History:  Procedure Laterality Date   ACHILLES TENDON SURGERY Right 3/16   ANTERIOR CERVICAL DECOMP/DISCECTOMY FUSION N/A 03/11/2016   Procedure: C5-6, C6-7 Anterior Cervical Discectomy and Fusion, Allograft, Plate;  Surgeon: Eldred Manges, MD;  Location: [facility] OR;  Service: Orthopedics;  Laterality: N/A;   CHOLECYSTECTOMY  2014   COLONOSCOPY WITH PROPOFOL N/A 08/25/2017   Procedure: COLONOSCOPY WITH PROPOFOL;   Surgeon: Scot Jun, MD;  Location: [facility] ENDOSCOPY;  Service: Endoscopy;  Laterality: N/A;   CYSTO WITH HYDRODISTENSION N/A 01/26/2014   Procedure: CYSTOSCOPY/HYDRODISTENSION;  Surgeon: [name], MD;  Location: [facility];  Service: Urology;  Laterality: N/A;   CYSTO/  URETHRAL DILATION/  HYDRODISTENTION/   INSTILLATION THERAPY  07-16-2010//   12-30-2007//   10-27-2006   ELBOW SURGERY Right    EXERCISE TOLERENCE TEST  10-12-2010   NEGATIVE  ADEQUATE ETT/  NO ISCHEMIA OR EVIDENCE HIGH GRADE OBSTRUCTIVE CAD/  NO FURTHER TEST NEEDED   HYSTEROSCOPY W/  NOVASURE ENDOMETRIAL ABLATION  2014   LAPAROSCOPIC OVARIAN CYST BX  2005   AND  URETEROSCOPIC LASER LITHO  STONE EXTRACTION   SHOULDER OPEN ROTATOR CUFF REPAIR Right 2004   TONSILLECTOMY     TRANSTHORACIC ECHOCARDIOGRAM  06-07-2006   normal study/  ef 60-65%   TUBAL LIGATION Bilateral 1995   Patient Active Problem List   Diagnosis Date Noted   Pain in left knee 04/22/2023   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 [facility]) 01/09/2023   Erythrocytosis 01/23/2022   Pain in  left ankle and joints of left foot 12/14/2021   Family history of blood clots 10/09/2021   Lateral epicondylitis, right elbow 05/25/2019   Atherosclerosis of aorta (HCC) 10/28/2018   Elevated glucose 10/28/2018   Mixed hyperlipidemia 10/28/2018   Hypothyroidism 07/20/2018   LLQ pain 12/03/2017   Pre-diabetes 06/03/2017   Rectal bleeding 06/02/2017   S/P cervical spinal fusion 03/11/2016   Bilateral hand pain 11/29/2015   Elevated C-reactive protein 11/29/2015   Elevated rheumatoid factor 11/29/2015   Raynaud's phenomenon without gangrene 11/29/2015   Bloating 09/12/2014   Dizziness 08/08/2014   Peri-menopause 08/08/2014   Chronic tension-type headache, intractable 07/13/2014   Gastroesophageal reflux disease without esophagitis 07/12/2014   Anxiety 04/18/2014   Chronic arthritis 04/18/2014   Cystitis 04/18/2014   Morbid obesity (HCC)  04/18/2014   Fibromyalgia 04/18/2014   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HCC) 04/18/2014   Osteoporosis, 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   Arthropathy 04/18/2014   Cephalalgia 04/04/2014   Numbness and tingling 04/04/2014   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 02/23/2013   Nonspecific abnormal findings on radiological and other examination of skull and head 02/23/2013   Other nonspecific abnormal result of function study of brain and central nervous system 07/01/2012   Disturbance of skin sensation 07/01/2012   Migraine without aura 07/01/2012   Abdominal pain 02/11/2012   Chronic fatigue syndrome 02/11/2012   Chronic interstitial cystitis 02/11/2012   Dyspareunia 02/11/2012   Dysuria 02/11/2012   Female stress incontinence 02/11/2012   Irritable bowel syndrome with diarrhea 02/11/2012   Nausea and vomiting 02/11/2012   Nocturia 02/11/2012   Urge incontinence 02/11/2012   Urinary urgency 02/11/2012   Lumbar spondylosis 02/04/2012   Myalgia and myositis 02/04/2012   Depression 02/04/2012   Lumbosacral spondylosis without myelopathy 02/04/2012   OBESITY, UNSPECIFIED 10/03/2010   GENERALIZED ANXIETY DISORDER 10/03/2010   Chest pain 10/03/2010    PCP: Lonie Peak PA-C  REFERRING PROVIDER: Eldred Manges, MD  REFERRING DIAG: Z98.1 (ICD-10-CM) - S/P cervical spinal fusion  THERAPY DIAG:  S/P cervical spinal fusion  Cervicalgia  Sensory disturbance  Muscle weakness (generalized)  Rationale for Evaluation and Treatment: Rehabilitation  ONSET DATE: chronic   SUBJECTIVE:                                                                                                                                                                                                         SUBJECTIVE STATEMENT: Pt has min to no pain in neck today, is tolerable. Heel pain 9.5/10.  Had procedure for extracorporeal shockwave therapy the other day for  her achilles and it didn't really do anything  yet. She plans to get another TENS unit.    Hand dominance: Right  PERTINENT HISTORY:  Fibromyalgia, back pain, hip pain ,right shoulder rotator cuff repair, right foot surgery ,Achilles tendinitis, obesity, anxiety, osteoporosis, left knee pain  Dr. Ophelia Charter: Patient requested a repeat knee injection which last year gave her good relief for a period of time. Will set up for some physical therapy for her neck for cervical spondylosis with disc base narrowing above her solid fusion. She has prominent spurs posteriorly and may have some cervical stenosis causing her upper and lower extremity symptoms.   She has had pain in her legs numbness in her feet when she stands or walks. Numbness with prolonged sitting or driving. Some back pain occasionally. Pain sometimes wakes her up at night. She states she was diagnosed with fibromyalgia.  PAIN:  Are you having pain? Yes: NPRS scale: 1/10 Pain location: posterior neck  Pain description: sore Aggravating factors: activity, prolonged sitting Relieving factors: neck pillow, meds, rest  L knee pain with shot in her knee Rt achilles with shot 05/14/23: Rt achilles pain 9/10   PRECAUTIONS: Other: none   RED FLAGS: None     WEIGHT BEARING RESTRICTIONS: No  FALLS:  Has patient fallen in last 6 months? 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 I want to get rid of this dizziness too.   NEXT MD VISIT: Unknown  OBJECTIVE:   DIAGNOSTIC FINDINGS:  Impression: Fused C5-6 C6-7.  Progressive spondylosis adjacent level C4-5  with prominent posterior osteophytes .   PATIENT  SURVEYS:  FOTO 42% 05/29/23: 49% (predicted 50%)   COGNITION: Overall cognitive status: Within functional limits for tasks assessed  SENSATION: WFL UE and LE numb and tingly daily, off and on.  POSTURE: rounded shoulders, forward head, and increased thoracic kyphosis  PALPATION: Pain with palpation to right upper trap and suboccipitals posterior cervicals   CERVICAL ROM:   Active ROM A/PROM (deg) eval AROM  05/29/23 AROM  06/16/23  Flexion 46  WNL   Extension 55  WNL   Right lateral flexion 45  45  Left lateral flexion 45  45  Right rotation [facility]  WFL   Left rotation Pain on L , WFL Pain looking left, less, WFL Tighter than R    (Blank rows = not tested)  UPPER EXTREMITY ROM: Eval: WFLs   Active ROM Right eval Left eval  Shoulder flexion    Shoulder extension    Shoulder abduction    Shoulder adduction    Shoulder extension    Shoulder internal rotation    Shoulder external rotation    Elbow flexion    Elbow extension    Wrist flexion    Wrist extension    Wrist ulnar deviation    Wrist radial deviation    Wrist pronation    Wrist supination     (Blank rows = not tested)  UPPER EXTREMITY MMT:  MMT Right eval Left eval Rt,Lt.  06/16/23  Shoulder flexion 4- 4 4+, 4  Shoulder extension  Shoulder abduction 3+ 4 3+, 4   Shoulder adduction     Shoulder extension     Shoulder internal rotation     Shoulder external rotation     Middle trapezius     Lower trapezius     Elbow flexion 4 4   Elbow extension 4 4   Wrist flexion     Wrist extension     Wrist ulnar deviation     Wrist radial deviation     Wrist pronation     Wrist supination     Grip strength 40 42    (Blank rows = not tested)  CERVICAL SPECIAL TESTS:  Neck flexor muscle endurance test: NT on eval  and Distraction test: no change     LOWER EXTREMITY MMT:  WFLs  MMT Right eval Left eval  Hip flexion    Hip extension    Hip abduction    Hip adduction    Hip internal rotation     Hip external rotation    Knee flexion    Knee extension    Ankle dorsiflexion    Ankle plantarflexion    Ankle inversion    Ankle eversion     (Blank rows = not tested)    FUNCTIONAL TESTS:  5 times sit to stand: 11 sec    TODAY'S TREATMENT:  [facility] Adult PT Treatment:                                                DATE: 06/16/23 Therapeutic Exercise: Supine chin tuck and retraction of scapula Chest press and long arm flexion 2 lbs  x 10 Reverse fly and scaption in sidelying 2 lbs x 15  Seated cervical ROM, all planes  Slantboard 1 min  Abduction green band x 10 x 2  Flexion green band x 10 x 2  Row yellow springs x 15  Shoulder extension x 15 slastix  Standing chest press x 10 to overhead lift with slastix for shoulder mobility  Pt did NuStep 10 min independently post session.   [facility] Adult PT Treatment:                                                DATE: 05/29/23 Therapeutic Exercise: Nustep L5 x 6 minutes  Green band row x 15 Green band ext x 15  Slant board 60 sec  Tandem stance 15- 20 sec  Seated horiz abdct 10 x 2  Supine ER bilat Green 10 x 2   Manual Therapy: STW to cervical paraspinals, suboccipital release   Self Care: TPR with theracane   [facility] Adult PT Treatment:                                                DATE: 05/27/23 Therapeutic Exercise: Nustep L5 x 6 minutes  Supine horiz abdct 10 x 2  Green  Supine alternating diagonals Green band 5 x 2  Supine ER bilat Green 10 x 2  Supine chin tuck over towel 5 sec 10 DNF endurance 32 sec  Manual Therapy: STW to cervical paraspinals, suboccipital  release      PATIENT EDUCATION:  Education details: PT, POC, balance, causes of dizziness, role of activity with wgt loss  Person educated: Patient Education method: Explanation Education comprehension: verbalized understanding  HOME EXERCISE PROGRAM: Access Code: [facility] URL: [email or web address] Date: 05/14/2023 Prepared by: [name]  Exercises - Standing Cervical Rotation AROM with Overpressure  - 1 x daily - 7 x weekly - 2 sets - 10 reps - 10-15 hold - Seated Cervical Sidebending Stretch  - 1 x daily - 7 x weekly - 2 sets - 10 reps - 10-15 hold - Seated Scapular Retraction  - 1 x daily - 7 x weekly - 2 sets - 10 reps - 5 hold - Seated Cervical Retraction  - 1 x daily - 7 x weekly - 2 sets - 10 reps - 5 hold - Seated Marching with Opposite Shoulder Flexion  - 1 x daily - 7 x weekly - 2 sets - 10 reps - 5 hold - Standing Shoulder Horizontal Abduction with Resistance  - 1 x daily - 7 x weekly - 2 sets - 10 reps - 5 hold - Wall Angels  - 1 x daily - 7 x weekly - 2 sets - 10 reps - 2-3 hold - Gastroc Stretch on Wall  - 1 x daily - 7 x weekly - 1 sets - 5 reps - 30 hold - Standing Bilateral Heel Raise on Step  - 1 x daily - 7 x weekly - 2 sets - 10 reps - Single Leg Stance with Support  - 1 x daily - 7 x weekly - 1 sets - 3-5 reps - 30 hold -flexion and abduction  ASSESSMENT:  CLINICAL IMPRESSION: Overall patient reports minimal neck pain today and recently.  Her Achilles pain has been more severe and has probably distracted her from her neck and shoulder symptoms.  She is overall her right shoulder is a bit stronger than evaluation her neck range of motion is near normal.  I suggested she wrap up therapy for her neck in the next session and consider physical therapy for her Achilles pain if Dr. Charlsie Merles thinks it may benefit her.  Will reassess next visit.  OBJECTIVE IMPAIRMENTS: decreased balance, decreased coordination, decreased endurance, decreased mobility, difficulty walking, decreased ROM, decreased strength, dizziness, hypomobility, increased fascial restrictions, impaired flexibility, impaired sensation, impaired UE functional use, postural dysfunction, obesity, and pain.   ACTIVITY LIMITATIONS: carrying, lifting, bending, sitting, standing, squatting, sleeping, transfers, bed mobility, reach over head, and  locomotion level  PARTICIPATION LIMITATIONS: meal prep, cleaning, laundry, interpersonal relationship, driving, shopping, and community activity  PERSONAL FACTORS: Past/current experiences, Social background, Time since onset of injury/illness/exacerbation, and 3+ comorbidities: fibro, Rt ankle pain, L knee pain  are also affecting patient's functional outcome.   REHAB POTENTIAL: Good  CLINICAL DECISION MAKING: Evolving/moderate complexity  EVALUATION COMPLEXITY: Moderate   GOALS: Goals reviewed with patient? Yes SHORT TERM GOALS: Target date: 05/23/2023     Patient will be independent with home program for posture strength and mobility.  Baseline: up to date  05/27/23: less compliant last week due to vertigo  Goal status: ongoing   2.  Patient will be able to report 3 self-care strategies for pain relief Baseline:  05/27/23: muscle rub, soft neck brace, neck pillow  Goal status: MET    3.  Patient will be able to complete functional testing /balance screen and have goals set Baseline:  Goal status:MET    LONG TERM GOALS: Target date: 06/06/2023  Patient will improve Foto score to 50% or better to demonstrate improved functional mobility Baseline:  Goal status:ongoing   2.  Pt will be able to walk on the treadmill 10 minutes every day to build the habit of exercise Baseline: LE numbness increases  05/27/23: gave the T.M to son this weekend -interested in purchasing stepper  Goal status: ongoing  3.  Patient will be able to report sensory symptoms in upper body improved by 25% Baseline: daily, moderate throughout the day  Goal status: partially met , is much better   4.  Patient will be able to complete light housework with no increase in pain Baseline: moderate pain in neck and UEs  Goal status: ongoing   5.  Patient will be able to fully perform active range of motion in cervical spine without increased pain Baseline: Pain with left rotation and sidebending right and  left Goal status: ongoing   6.  Patient will be able to lift 10 pounds from the floor to shoulder height x 10 to demo improved functional strength Baseline:  Goal status: ongoing    PLAN:  PT FREQUENCY: 2x/week  PT DURATION: 8 weeks  PLANNED INTERVENTIONS: Therapeutic exercises, Therapeutic activity, Neuromuscular re-education, Balance training, Gait training, Patient/Family education, Self Care, Joint mobilization, Aquatic Therapy, Dry Needling, Spinal mobilization, Cryotherapy, Moist heat, Taping, Manual therapy, and Re-evaluation  PLAN FOR NEXT SESSION: See above consider discharge for neck .  gentle cervical ROM, shoulder ROM and strength, posture, modalities as needed.  Standing balance, Achilles, [employer], PT 06/16/23 5:12 PM Phone: [phone] Fax: [phone]

## 2023-06-17 ENCOUNTER — Encounter: Payer: Self-pay | Admitting: Physical Medicine and Rehabilitation

## 2023-06-20 ENCOUNTER — Encounter: Payer: Medicare Other | Admitting: Physical Medicine and Rehabilitation

## 2023-06-20 ENCOUNTER — Other Ambulatory Visit: Payer: Self-pay | Admitting: Physical Medicine and Rehabilitation

## 2023-06-20 ENCOUNTER — Telehealth: Payer: Self-pay | Admitting: Physical Medicine and Rehabilitation

## 2023-06-20 ENCOUNTER — Other Ambulatory Visit: Payer: Self-pay

## 2023-06-20 DIAGNOSIS — M797 Fibromyalgia: Secondary | ICD-10-CM

## 2023-06-20 MED ORDER — HYDROCODONE-ACETAMINOPHEN 10-325 MG PO TABS
1.0000 | ORAL_TABLET | Freq: Three times a day (TID) | ORAL | 0 refills | Status: DC | PRN
Start: 2023-06-20 — End: 2023-06-26
  Filled 2023-06-20 – 2023-06-23 (×2): qty 90, 30d supply, fill #0

## 2023-06-20 MED ORDER — HYDROCODONE-ACETAMINOPHEN 10-325 MG PO TABS
1.0000 | ORAL_TABLET | Freq: Three times a day (TID) | ORAL | 0 refills | Status: DC | PRN
Start: 2023-06-20 — End: 2023-06-20

## 2023-06-20 NOTE — Telephone Encounter (Signed)
Patient called in and states Hydrocodone needs to be sent to a different pharmacy - is requesting it sent to [facility] outpatient pharmacy and not [facility]

## 2023-06-20 NOTE — Progress Notes (Addendum)
Subjective:    Patient ID: Karina Greer, female    DOB: [birth year], 57 y.o.   MRN: [id number]  An audio/video tele-health visit is felt to be the most appropriate encounter for this patient at this time. This is a follow up tele-visit via phone. The patient is at home. MD is at office. Prior to scheduling this appointment, our staff discussed the limitations of evaluation and management by telemedicine and the availability of in-person appointments. The patient expressed understanding and agreed to proceed.   Karina Greer is a 57 y.o. female who presents for f/u of fibromyalgia and interstitial cystitis.   1) Interstitial cystitis -she has been having burning when she urinates -she got UA done yesterday, discussed results with her today  2) Diffuse chronic pain: back pain and diffuse in joints and muscles -her back has been more stiff recently -cold weather worsens her symptoms Had worsening lower back pain after the ECSWT to her lower extremity -she tried naltrexone, she felt like it made her sick, she feels nauseous every day so it wasn't that- it was more flulike symptoms. This was with the 1mg  of naltrexone. She did not take opioid that day.  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 Sometimes she does not eat until lunch. She has been trying to do the fasting we discussed last visit.  -Her son is 278 pounds and 6 foot tall. He went to 242 lbs following a similar diet.  -Her dietary goals are to make use of what she eats.   -she has been eating a lot of salads.  -she cannot handle hard cheeses.  -she has been having a problem eating hamburger.  -Last night she made four pieces of chicken and put on some tomato sauce and red peppers, she sprinkled some cheese, added broccoli, olive oil, garlic, butter.  -she is planned to have her HgbA1c and lipid panel drawn by her PCP next week.  -has been having low back pain, MRI ordered and shows stable spondylosis from prior -she feels better when there is no food in her stomach -she got organic oranges from a farm and is going to make her own orange juice -she bought the Bioptemizer's magnesium so will start this soon -she is having a lot of pain in her neck and feels this is from the weather and her surgery -her calf is hurting a lot. She has not had an ultrasound. -blood clots run in her family.   -she bought [place] [place], Advice worker, magnesium breakthrough, Krill oil, Spirulina, Coenzyme Q10 -she saw an endocrinologist and she did a swab test -she went today for bloodwork to check her potassium, cortisol to look for Cushing's Disease -she feels like her hormones are off balance.  -she feels that she has all the symptoms of Cushing's Disease -she was told her rheum panel is normal.  -she was told she has a meniscal tear by ortho   3) Trigeminal neuralgia bilateral: -she said a procedure on  her left side with good effect -she tried carbamezepine and it made her stare into outer space  4) Obesity -she is down to 225 lbs!  5) MTHFR mutation -discussed that homocysteine level is 7.3, which is in the normal range -she tries to eat healthy -she does not smoke or use alcohol  6) Cervical disc disease -she was told by Dr. Ophelia Charter that she will need surgery but she doesn't think she can do this at this time as she is taking care of both her son and mother.  -she is doing PT and this seems to help -she would be interested in trying a tens unit and a cervical traction  device  7) Achilles tendinopathy -she noted in her provider notes that she also has a tear and she asks if it is still ok for her to get ECSWT  Pain Inventory Average Pain 8 Pain Right Now 6 My pain is constant, sharp, burning, dull, stabbing, and tingling  In the last 24 hours, has pain interfered with the following? General activity 5 Relation with others 5 Enjoyment of life 3 What TIME of day is your pain at its worst? morning , daytime, and night Sleep (in general) Poor  Pain is worse with: walking, bending, sitting, inactivity, standing, unsure, and some activites Pain improves with: rest, heat/ice, therapy/exercise, pacing activities, medication, TENS, and injections Relief from Meds: 7  Family History  Problem Relation Age of Onset   Hypertension Mother    Diabetes Father    Brain cancer Father    Clotting disorder Father        blood clots   Fibromyalgia Sister    Suicidality Sister    Hypertension Other        Cancer, Cerebrovascular disease run on mother side of family   Social History   Socioeconomic History   Marital status: Divorced    Spouse name: Not on file   Number of children: 3   Years of education: HS   Highest education level: 12th grade  Occupational History   Occupation: Designer, industrial/product: UNEMPLOYED    Comment: Disability  Tobacco Use   Smoking status: Never   Smokeless tobacco: Never  Vaping Use   Vaping status: Never Used  Substance and Sexual Activity   Alcohol use: No   Drug use: No   Sexual activity: Not on file  Other Topics Concern   Not on file  Social History Narrative   Patient is right-handed. She avoids caffeine. She has recently been using the treadmill.   Social Determinants of Health   Financial Resource Strain: Not on file  Food Insecurity: Not on file  Transportation Needs: Not on file  Physical Activity: Not on file  Stress: Not on file  Social Connections: Not on file   Past Surgical History:   Procedure Laterality Date   ACHILLES TENDON SURGERY Right 3/16   ANTERIOR CERVICAL DECOMP/DISCECTOMY FUSION N/A 03/11/2016   Procedure: C5-6, C6-7 Anterior Cervical Discectomy and Fusion, Allograft, Plate;  Surgeon: Eldred Manges, MD;  Location: [facility] OR;  Service: Orthopedics;  Laterality: N/A;   CHOLECYSTECTOMY  2014   COLONOSCOPY WITH PROPOFOL N/A 08/25/2017   Procedure: COLONOSCOPY WITH PROPOFOL;  Surgeon: Scot Jun, MD;  Location: [facility] ENDOSCOPY;  Service: Endoscopy;  Laterality: N/A;   CYSTO WITH HYDRODISTENSION N/A 01/26/2014   Procedure: CYSTOSCOPY/HYDRODISTENSION;  Surgeon: [name], MD;  Location: [facility];  Service: Urology;  Laterality: N/A;   CYSTO/  URETHRAL  DILATION/  HYDRODISTENTION/   INSTILLATION THERAPY  07-16-2010//   12-30-2007//   10-27-2006   ELBOW SURGERY Right    EXERCISE TOLERENCE TEST  10-12-2010   NEGATIVE  ADEQUATE ETT/  NO ISCHEMIA OR EVIDENCE HIGH GRADE OBSTRUCTIVE CAD/  NO FURTHER TEST NEEDED   HYSTEROSCOPY W/  NOVASURE ENDOMETRIAL ABLATION  2014   LAPAROSCOPIC OVARIAN CYST BX  2005   AND  URETEROSCOPIC LASER LITHO  STONE EXTRACTION   SHOULDER OPEN ROTATOR CUFF REPAIR Right 2004   TONSILLECTOMY     TRANSTHORACIC ECHOCARDIOGRAM  06-07-2006   normal study/  ef 60-65%   TUBAL LIGATION Bilateral 1995   Past Surgical History:  Procedure Laterality Date   ACHILLES TENDON SURGERY Right 3/16   ANTERIOR CERVICAL DECOMP/DISCECTOMY FUSION N/A 03/11/2016   Procedure: C5-6, C6-7 Anterior Cervical Discectomy and Fusion, Allograft, Plate;  Surgeon: Eldred Manges, MD;  Location: [facility] OR;  Service: Orthopedics;  Laterality: N/A;   CHOLECYSTECTOMY  2014   COLONOSCOPY WITH PROPOFOL N/A 08/25/2017   Procedure: COLONOSCOPY WITH PROPOFOL;  Surgeon: Scot Jun, MD;  Location: [facility] ENDOSCOPY;  Service: Endoscopy;  Laterality: N/A;   CYSTO WITH HYDRODISTENSION N/A 01/26/2014   Procedure: CYSTOSCOPY/HYDRODISTENSION;  Surgeon: [name], MD;   Location: [facility];  Service: Urology;  Laterality: N/A;   CYSTO/  URETHRAL DILATION/  HYDRODISTENTION/   INSTILLATION THERAPY  07-16-2010//   12-30-2007//   10-27-2006   ELBOW SURGERY Right    EXERCISE TOLERENCE TEST  10-12-2010   NEGATIVE  ADEQUATE ETT/  NO ISCHEMIA OR EVIDENCE HIGH GRADE OBSTRUCTIVE CAD/  NO FURTHER TEST NEEDED   HYSTEROSCOPY W/  NOVASURE ENDOMETRIAL ABLATION  2014   LAPAROSCOPIC OVARIAN CYST BX  2005   AND  URETEROSCOPIC LASER LITHO  STONE EXTRACTION   SHOULDER OPEN ROTATOR CUFF REPAIR Right 2004   TONSILLECTOMY     TRANSTHORACIC ECHOCARDIOGRAM  06-07-2006   normal study/  ef 60-65%   TUBAL LIGATION Bilateral 1995   Past Medical History:  Diagnosis Date   Anxiety    Anxiety disorder    Arthritis    Blepharitis    Chronic low back pain    Chronic pain    Depression    Diabetes mellitus without complication (HCC)    Dyslipidemia    Family history of blood clots    Fibromyalgia    GERD (gastroesophageal reflux disease)    Headache    hx migraines   History of kidney stones    History of panic attacks    History of renal calculi    IBS (irritable bowel syndrome)    Interstitial cystitis    Lupus (HCC)    tested positive for antibodies for lupus. 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   There were no vitals taken for this visit.  Opioid Risk Score:   Fall Risk Score:  `1  Depression screen [facility] 2/9     05/14/2023    9:07 AM 02/24/2023    8:38 AM 01/24/2023  9:02 AM 12/27/2022    8:42 AM 11/29/2022   10:37 AM 10/04/2022    8:29 AM 09/27/2022    9:50 AM  Depression screen PHQ 2/9   Decreased Interest 0 0 0 0 0  0  Down, Depressed, Hopeless 0 0 0 0 0  0  PHQ - 2 Score 0 0 0 0 0  0  Altered sleeping         Tired, decreased energy         Change in appetite         Feeling bad or failure about yourself          Trouble concentrating         Moving slowly or fidgety/restless         Suicidal thoughts         PHQ-9 Score            Information is confidential and restricted. Go to Review Flowsheets to unlock data.    Review of Systems  Musculoskeletal:  Positive for arthralgias and back pain.       Pain all over the body  All other systems reviewed and are negative.      Objective:   Physical Exam Gen: no distress, normal appearing, weight 229 lbs, BMI 40.57 HEENT: oral mucosa pink and moist, NCAT Cardio: Reg rate Chest: normal effort, normal rate of breathing Abd: soft, non-distended Ext: no edema Psych: pleasant, normal affect Skin: intact Neuro: Alert and oriented x3    Assessment & Plan:  1. Lumbago/ Lumbar Spondylosis/ Left Lumbar Radiculitis: Continue to Monitor. 09/02/202. -Refilled HYDROcodone 10/325mg  one tablet every 8 hours as needed #90. Discussed that MRI results show spondylosis that is stable from prior. Continue using muscle rub. Refilled Lidocaine patches.   -discussed weaning off if she can tolerate.  -discussed risks and benefits of bracing. Prescribed lumboscaral brace with the goal of reducing pain medication use and improving mobility.   -prescribed Zynex Tens unit  -discussed mechanism of action of low dose naltrexone as an opioid receptor antagonist which stimulates your body's production of its own natural endogenous opioids, helping to decrease pain. Discussed that it can also decrease T cell response and thus be helpful in decreasing inflammation, and symptoms of brain fog, fatigue, anxiety, depression, and allergies. Discussed that this medication needs to be compounded at a compounding pharmacy and can more expensive. Discussed  that I usually start at 1mg  and if this is not providing enough relief then I titrate upward on a monthly basis.    -UDS monitoring routinely  -Discussed her worsening pain with the cold weather  -refilled lidocaine patches  2. Fibromyalgia. Continue Current exercise Regime. 05/25/2021. Discussed aqua therapy.  -recommended infrared light therapy -ordered Zynex Medical heating blanket for 90 days, discussed the benefits of heat in improving blood circulation.  -discussed use of TENs unit  3. Anxiety and depression:Psychiatry Following: Counselor Jessica  at [facility] . Continue Counseling at [facility]. 05/25/2021  4. Migraines: On Maxalt. Neurology Following. 05/25/2021. Start Bioptemizers magnesium.   5. OSA : PCP Following. Continue to Monitor. 05/25/2021  6. Obesity:   -commended on 6 lbs loss! -continue to avoid bread -check vitamin D level today.  -continue 2 TB of saurkraut every morning  -recommended to use her krill oil, Vitamin D, [place], magnesium breakthrough  -encouraged dandelion tea  -discussed wormwood/walnut oil cleanse -recommended nattokinase -encouraged endocrinology follow-up -counseled regarding the benefits of intermittent fasting and improving insulin sensitivity. -  Educated that current weight is 225 lbs and current BMI is 40.00 -Educated regarding health benefits of weight loss- for pain, general health, chronic disease prevention, immune health, mental health.  -Will monitor weight every visit.  -Continue Ro[place]ois tea daily. -Discussed the benefits of intermittent fasting. Continue this. Discussed that 30 hour fasts are healthy.  -Discussed foods that can assist in weight loss: 1) leafy greens- high in fiber and nutrients 2) dark chocolate- improves metabolism (if prefer sweetened, best to sweeten with honey instead of sugar).  3) cruciferous vegetables- high in fiber and protein 4) full fat yogurt: high in healthy fat, protein, calcium, and  probiotics 5) apples- high in a variety of phytochemicals 6) nuts- high in fiber and protein that increase feelings of fullness 7) grapefruit: rich in nutrients, antioxidants, and fiber (not to be taken with anticoagulation) 8) beans- high in protein and fiber 9) salmon- has high quality protein and healthy fats 10) green tea- rich in polyphenols 11) eggs- rich in choline and vitamin D 12) tuna- high protein, boosts metabolism 13) avocado- decreases visceral abdominal fat 14) chicken (pasture raised): high in protein and iron 15) blueberries- reduce abdominal fat and cholesterol 16) whole grains- decreases calories retained during digestion, speeds metabolism 17) chia seeds- curb appetite 18) chilies- increases fat metabolism  -Discussed supplements that can be used:  1) Metatrim 400mg  BID 30 minutes before breakfast and dinner  2) Sphaeranthus indicus and Garcinia mangostana (combinations of these and #1 can be found in capsicum and zychrome  3) green coffee bean extract 400mg  twice per day or Irvingia (african mango) 150 to 300mg  twice per day. Teas: Green tea Black tea Oolong tea White tea [place] Tea, [place] Continue to avoid added sugar. Continue using honey or monk fruit as sweeteners.   7. Status Post Cervical Spinal Fusion: C5C6- C-6- C-7 Anterior Cervical Discectomy Fusion and Allograft Plate: Dr. Ophelia Charter Following. 05/25/2021  8. Cervicalgia/ Cervical Radiculitis/ S/P Cervical Spinal Fusion:  Continue to Monitor Dr. Ophelia Charter Following. 05/25/2021  9. 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 05/25/2021.  11.Right   Greater Trochanteric Bursitis: . Continue to Alternate Ice and heat Therapy. Continue to Monitor. 05/25/2021.  12. Chronic Bilateral Knee Pain: No Complaints Today. Continue HEP as Tolerated. Continue to Monitor. 05/25/2021  13. Lateral Epicondylitis of Right Elbow: No  complaints today.  Ortho Following. S/P   06/26/2020 Right lateral epicondyle debridement, drilling and repair  By Dr Ophelia Charter. 14. Neuralgia/ Right Facial Droop: Neurology following. Continue to monitor. 05/25/2021.  15. Polyarthralgia: No complaints today. Continue HEP as tolerated. Continue to monitor.   16. Nausea: discussed trying an elimination diet avoiding gluten, dairy, and eggs for 4 weeks.   17. Right lower extremity pain: vascular ultrasound ordered to assess for clot.   18. Abdominal cramps -recommended saffron -recommended to follow-up with OB/GYN  19. Rib pain: -recommended trying lidocaine patch  20. Left knee: potential meniscal tear: discusses getting MRI -discussed benefits of infrared light therapy.  -discussed response to steroid shot- was quite painful but benefitted her afterward.    21. Brain Fog -discussed speech therapy -discussed her full 6 hour neuropsych testing at [facility] and was told overall she did well but there were some domains in which she stayed low -continue CPAP -continue to stay well hydrated.  -discussed low dose naltrexone  22. Interstitial cystitis -discussed that UA is normal, discussed medication treatment options for interstitial cystitis and sent her a  link with more information. -discussed pentosan polysulfate sodium as a treatment to help restore the inner surface of the bladder.   23) MTHFR deficiency -discussed homocysteine level is 7.3 -commended on healthy lifestyle habits -made goal to eat leafy green vegetables  24) Mixed hyperlipidemia: -check homocysteine level  25) Trigeminal neuralgia -discussed carbamazepine but she defers due to side effects in the past.   26. Cervical disc diseases Prescribing Home Zynex NexWave Stimulator Device and supplies as needed. IFC, NMES and TENS medically necessary Treatment Rx: Daily @ 30-40 minutes per treatment PRN. Zynex NexWave only, no substitutions. Treatment Goals: 1) To reduce  and/or eliminate pain 2) To improve functional capacity and Activities of daily living 3) To reduce or prevent the need for oral medications 4) To improve circulation in the injured region 5) To decrease or prevent muscle spasm and muscle atrophy 6) To provide a self-management tool to the patient The patient has not sufficiently improved with conservative care. Numerous studies indexed by Medline and PubMed.gov have shown Neuromuscular, Interferential, and TENS stimulators to reduce pain, improve function, and reduce medication use in injured patients. Continued use of this evidence based, safe, drug free treatment is both reasonable and medically necessary at this time.   27) Achilles tendinopathy/tear Discussed extracorporeal shockwave therapy as a modality for treatment. Discussed that the device looks and feels like a massage gun and I would move it over the area of pain for about 10 minutes. The device releases sound waves to the area of pain and helps to improve blood flow and circulation to improve the healing process. Discuss that this initially induces inflammation and can sometimes cause short-term increase in pain. Discussed that we typically do three weekly treatments, but sometimes up to 6 if needed, and after 6 weeks long term benefits can sometimes be achieved. Discussed that this is an FDA approved device, but not covered by insurance and would cost $60 per session. Will scheduled patient for 6 consecutive appointments and can cancel latter three if benefits are achieved after first three sessions.    -discussed response to last ECSWT treatment, discussed that she is not sure if it is related but that she developed sacral pain and vaginal pain later in the week that was so severe that she almost went to the ED, discussed that the ECSWT was applied near the S1 nerve root and that the sacral area is innervated by S2-S4 and that thought I would not expect it to aggravate this area it is possible  since they are located closely in the sacral plexus   21 minutes spent in discussion of response to last ECSWT treatment, discussed that she is not sure if it is related but she developed sacral pain and vaginal pain later in the week was so severe that she almost went to the ED, discussed that the ECSWT was applied near the S1 nerve root on the calf and that the sacral area is innervated by S2-S4 and that though I would not expect it to aggravate this area it is possible since they are located close together in the sacral plexus

## 2023-06-23 ENCOUNTER — Other Ambulatory Visit: Payer: Self-pay

## 2023-06-24 ENCOUNTER — Encounter: Payer: Self-pay | Admitting: Orthopaedic Surgery

## 2023-06-24 ENCOUNTER — Ambulatory Visit (INDEPENDENT_AMBULATORY_CARE_PROVIDER_SITE_OTHER): Payer: Medicare Other | Admitting: Orthopaedic Surgery

## 2023-06-24 ENCOUNTER — Encounter: Payer: Self-pay | Admitting: Physical Therapy

## 2023-06-24 ENCOUNTER — Ambulatory Visit: Payer: Medicare Other | Attending: Orthopaedic Surgery | Admitting: Physical Therapy

## 2023-06-24 ENCOUNTER — Other Ambulatory Visit (INDEPENDENT_AMBULATORY_CARE_PROVIDER_SITE_OTHER): Payer: Self-pay

## 2023-06-24 VITALS — BP 120/89 | HR 67

## 2023-06-24 DIAGNOSIS — Z981 Arthrodesis status: Secondary | ICD-10-CM

## 2023-06-24 DIAGNOSIS — M25511 Pain in right shoulder: Secondary | ICD-10-CM | POA: Diagnosis not present

## 2023-06-24 DIAGNOSIS — M542 Cervicalgia: Secondary | ICD-10-CM | POA: Insufficient documentation

## 2023-06-24 NOTE — Therapy (Signed)
OUTPATIENT PHYSICAL THERAPY TREATMENT DISCHARGE   Patient Name: Karina Greer MRN: [id number] DOB:05-08-1966, 57 y.o., female Today's Date: 06/24/2023  PHYSICAL THERAPY DISCHARGE SUMMARY  Visits from Start of Care: 8  Current functional level related to goals / functional outcomes: See below, neck pain is much improved    Remaining deficits: Weakness, pain in multiple joints, endurance   Education / Equipment: HEP, posture , lifting    Patient agrees to discharge. Patient goals were met. Patient is being discharged due to meeting the stated rehab goals.   Karina Greer, PT 06/25/23 8:00 AM Phone: [phone] Fax: [phone]    END OF SESSION:  PT End of Session - 06/24/23 0726     Visit Number 8    Number of Visits 16    Date for PT Re-Evaluation 06/20/23    Authorization Type MCR, MCD [place]    PT Start Time 0715    PT Stop Time 0755    PT Time Calculation (min) 40 min              Past Medical History:  Diagnosis Date   Anxiety    Anxiety disorder    Arthritis    Blepharitis    Chronic low back pain    Chronic pain    Depression    Diabetes mellitus without complication (HCC)    Dyslipidemia    Family history of blood clots    Fibromyalgia    GERD (gastroesophageal reflux disease)    Headache    hx migraines   History of kidney stones    History of panic attacks    History of renal calculi    IBS (irritable bowel syndrome)    Interstitial cystitis    Lupus    tested positive for antibodies for lupus. 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   Past Surgical History:  Procedure Laterality  Date   ACHILLES TENDON SURGERY Right 3/16   ANTERIOR CERVICAL DECOMP/DISCECTOMY FUSION N/A 03/11/2016   Procedure: C5-6, C6-7 Anterior Cervical Discectomy and Fusion, Allograft, Plate;  Surgeon: Eldred Manges, MD;  Location: [facility] OR;  Service: Orthopedics;  Laterality: N/A;   CHOLECYSTECTOMY  2014   COLONOSCOPY WITH PROPOFOL N/A 08/25/2017   Procedure: COLONOSCOPY WITH PROPOFOL;  Surgeon: Scot Jun, MD;  Location: [facility] ENDOSCOPY;  Service: Endoscopy;  Laterality: N/A;   CYSTO WITH HYDRODISTENSION N/A 01/26/2014   Procedure: CYSTOSCOPY/HYDRODISTENSION;  Surgeon: [name], MD;  Location: [facility];  Service: Urology;  Laterality: N/A;   CYSTO/  URETHRAL DILATION/  HYDRODISTENTION/   INSTILLATION THERAPY  07-16-2010//   12-30-2007//   10-27-2006   ELBOW SURGERY Right    EXERCISE TOLERENCE TEST  10-12-2010   NEGATIVE  ADEQUATE ETT/  NO ISCHEMIA OR EVIDENCE HIGH GRADE OBSTRUCTIVE CAD/  NO FURTHER TEST NEEDED   HYSTEROSCOPY W/  NOVASURE ENDOMETRIAL ABLATION  2014   LAPAROSCOPIC OVARIAN CYST BX  2005   AND  URETEROSCOPIC LASER LITHO  STONE EXTRACTION   SHOULDER OPEN ROTATOR CUFF  REPAIR Right 2004   TONSILLECTOMY     TRANSTHORACIC ECHOCARDIOGRAM  06-07-2006   normal study/  ef 60-65%   TUBAL LIGATION Bilateral 1995   Patient Active Problem List   Diagnosis Date Noted   Pain in left knee 04/22/2023   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 [facility]) 01/09/2023   Erythrocytosis 01/23/2022   Pain in left ankle and joints of left foot 12/14/2021   Family history of blood clots 10/09/2021   Lateral epicondylitis, right elbow 05/25/2019   Atherosclerosis of aorta (HCC) 10/28/2018   Elevated glucose 10/28/2018   Mixed hyperlipidemia 10/28/2018   Hypothyroidism 07/20/2018   LLQ pain 12/03/2017   Pre-diabetes 06/03/2017   Rectal bleeding 06/02/2017   S/P cervical spinal fusion 03/11/2016   Bilateral hand pain 11/29/2015   Elevated C-reactive protein 11/29/2015   Elevated  rheumatoid factor 11/29/2015   Raynaud's phenomenon without gangrene 11/29/2015   Bloating 09/12/2014   Dizziness 08/08/2014   Peri-menopause 08/08/2014   Chronic tension-type headache, intractable 07/13/2014   Gastroesophageal reflux disease without esophagitis 07/12/2014   Anxiety 04/18/2014   Chronic arthritis 04/18/2014   Cystitis 04/18/2014   Morbid obesity (HCC) 04/18/2014   Fibromyalgia 04/18/2014   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HCC) 04/18/2014   Osteoporosis, 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   Arthropathy 04/18/2014   Cephalalgia 04/04/2014   Numbness and tingling 04/04/2014   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 02/23/2013   Nonspecific abnormal findings on radiological and other examination of skull and head 02/23/2013   Other nonspecific abnormal result of function study of brain and central nervous system 07/01/2012   Disturbance of skin sensation 07/01/2012   Migraine without aura 07/01/2012   Abdominal pain 02/11/2012   Chronic fatigue syndrome 02/11/2012   Chronic interstitial cystitis 02/11/2012   Dyspareunia 02/11/2012   Dysuria 02/11/2012   Female stress incontinence 02/11/2012   Irritable bowel syndrome with diarrhea 02/11/2012   Nausea and vomiting 02/11/2012   Nocturia 02/11/2012   Urge incontinence 02/11/2012   Urinary urgency 02/11/2012   Lumbar spondylosis 02/04/2012   Myalgia and myositis 02/04/2012   Depression 02/04/2012   Lumbosacral spondylosis without myelopathy 02/04/2012   OBESITY, UNSPECIFIED 10/03/2010   GENERALIZED ANXIETY DISORDER 10/03/2010   Chest pain 10/03/2010    PCP: Lonie Peak PA-C  REFERRING PROVIDER: Eldred Manges, MD  REFERRING DIAG: Z98.1 (ICD-10-CM) - S/P cervical spinal fusion  THERAPY DIAG:  S/P cervical spinal fusion  Cervicalgia  Rationale for Evaluation and Treatment: Rehabilitation  ONSET DATE: chronic   SUBJECTIVE:  SUBJECTIVE STATEMENT: Just a little neck pain. No longer having extremity swelling/numbness. Maybe a little better after shockwave for her achilles. She has another treatment of shockwave later this week.     Hand dominance: Right  PERTINENT HISTORY:  Fibromyalgia, back pain, hip pain ,right shoulder rotator cuff repair, right foot surgery ,Achilles tendinitis, obesity, anxiety, osteoporosis, left knee pain  Dr. Ophelia Charter: Patient requested a repeat knee injection which last year gave her good relief for a period of time. Will set up for some physical therapy for her neck for cervical spondylosis with disc base narrowing above her solid fusion. She has prominent spurs posteriorly and may have some cervical stenosis causing her upper and lower extremity symptoms.   She has had pain in her legs numbness in her feet when she stands or walks. Numbness with prolonged sitting or driving. Some back pain occasionally. Pain sometimes wakes her up at night. She states she was diagnosed with fibromyalgia.  PAIN:  Are you having pain? Yes: NPRS scale: 1-2/10 Pain location: posterior neck  Pain description: sore Aggravating factors: activity, prolonged sitting Relieving factors: neck pillow, meds, rest  L knee pain with shot in her knee Rt achilles with shot 05/14/23: Rt achilles pain 9/10   PRECAUTIONS: Other: none   RED FLAGS: None     WEIGHT BEARING RESTRICTIONS: No  FALLS:  Has patient fallen in last 6 months? 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 I want to get rid of this dizziness too.   NEXT MD VISIT: Unknown  OBJECTIVE:   DIAGNOSTIC FINDINGS:  Impression: Fused C5-6 C6-7.  Progressive spondylosis adjacent level C4-5  with prominent posterior osteophytes .   PATIENT SURVEYS:  FOTO 42% 05/29/23: 49% (predicted 50%)  06/24/23: 49%  COGNITION: Overall cognitive status: Within functional limits for tasks assessed  SENSATION: WFL UE and LE numb and tingly daily, off and on.  POSTURE: rounded shoulders, forward head, and increased thoracic kyphosis  PALPATION: Pain with palpation to right upper trap and suboccipitals posterior cervicals   CERVICAL ROM:   Active ROM A/PROM (deg) eval AROM  05/29/23 AROM  06/16/23  Flexion 46  WNL   Extension 55  WNL   Right lateral flexion 45  45  Left lateral flexion 45  45  Right rotation [facility]  WFL   Left rotation Pain on L , WFL Pain looking left, less, WFL Tighter than R    (Blank rows = not tested)  UPPER EXTREMITY ROM: Eval: WFLs   Active ROM Right eval Left eval  Shoulder flexion    Shoulder extension    Shoulder abduction    Shoulder adduction    Shoulder extension    Shoulder internal rotation    Shoulder external rotation    Elbow flexion    Elbow extension    Wrist flexion    Wrist extension    Wrist ulnar deviation    Wrist radial deviation    Wrist pronation    Wrist supination     (Blank rows = not tested)  UPPER EXTREMITY MMT:  MMT Right eval  Left eval Rt,Lt.  06/16/23  Shoulder flexion 4- 4 4+, 4  Shoulder extension     Shoulder abduction 3+ 4 3+, 4   Shoulder adduction     Shoulder extension     Shoulder internal rotation     Shoulder external rotation     Middle trapezius     Lower trapezius     Elbow flexion 4 4   Elbow extension 4 4   Wrist flexion     Wrist extension     Wrist ulnar deviation     Wrist radial deviation     Wrist pronation     Wrist supination     Grip strength 40 42     (Blank rows = not tested)  CERVICAL SPECIAL TESTS:  Neck flexor muscle endurance test: NT on eval  and Distraction test: no change     LOWER EXTREMITY MMT:  WFLs  MMT Right eval Left eval  Hip flexion    Hip extension    Hip abduction    Hip adduction    Hip internal rotation    Hip external rotation    Knee flexion    Knee extension    Ankle dorsiflexion    Ankle plantarflexion    Ankle inversion    Ankle eversion     (Blank rows = not tested)    FUNCTIONAL TESTS:  5 times sit to stand: 11 sec    TODAY'S TREATMENT:  [facility] Adult PT Treatment:                                                DATE: 06/24/23 Therapeutic Exercise: Review of HEP    [facility] Adult PT Treatment:                                                DATE: 06/16/23 Therapeutic Exercise: Supine chin tuck and retraction of scapula Chest press and long arm flexion 2 lbs  x 10 Reverse fly and scaption in sidelying 2 lbs x 15  Seated cervical ROM, all planes  Slantboard 1 min  Abduction green band x 10 x 2  Flexion green band x 10 x 2  Row yellow springs x 15  Shoulder extension x 15 slastix  Standing chest press x 10 to overhead lift with slastix for shoulder mobility  Pt did NuStep 10 min independently post session.   [facility] Adult PT Treatment:                                                DATE: 05/29/23 Therapeutic Exercise: Nustep L5 x 6 minutes  Green band row x 15 Green band ext x 15  Slant board 60 sec  Tandem stance 15- 20 sec  Seated horiz abdct 10 x 2  Supine ER bilat Green 10 x 2   Manual Therapy: STW to cervical paraspinals, suboccipital release   Self Care: TPR with theracane   [facility] Adult PT Treatment:  DATE: 05/27/23 Therapeutic Exercise: Nustep L5 x 6 minutes  Supine horiz abdct 10 x 2  Green  Supine alternating diagonals Green band 5 x 2  Supine ER bilat Green 10 x 2  Supine chin tuck over towel 5 sec 10 DNF endurance 32 sec   Manual Therapy: STW to cervical paraspinals, suboccipital release      PATIENT EDUCATION:  Education details: PT, POC, balance, causes of dizziness, role of activity with wgt loss  Person educated: Patient Education method: Explanation Education comprehension: verbalized understanding  HOME EXERCISE PROGRAM: Access Code: [facility] URL: [email or web address] Date: 05/14/2023 Prepared by: Karina Greer  Exercises - Standing Cervical Rotation AROM with Overpressure  - 1 x daily - 7 x weekly - 2 sets - 10 reps - 10-15 hold - Seated Cervical Sidebending Stretch  - 1 x daily - 7 x weekly - 2 sets - 10 reps - 10-15 hold - Seated Scapular Retraction  - 1 x daily - 7 x weekly - 2 sets - 10 reps - 5 hold - Seated Cervical Retraction  - 1 x daily - 7 x weekly - 2 sets - 10 reps - 5 hold - Seated Marching with Opposite Shoulder Flexion  - 1 x daily - 7 x weekly - 2 sets - 10 reps - 5 hold - Standing Shoulder Horizontal Abduction with Resistance  - 1 x daily - 7 x weekly - 2 sets - 10 reps - 5 hold - Wall Angels  - 1 x daily - 7 x weekly - 2 sets - 10 reps - 2-3 hold - Gastroc Stretch on Wall  - 1 x daily - 7 x weekly - 1 sets - 5 reps - 30 hold - Standing Bilateral Heel Raise on Step  - 1 x daily - 7 x weekly - 2 sets - 10 reps - Single Leg Stance with Support  - 1 x daily - 7 x weekly - 1 sets - 3-5 reps - 30 hold -flexion and abduction  ASSESSMENT:  CLINICAL IMPRESSION: Overall patient reports minimal neck pain today and overall improvement. She notes decreased UE swelling and decreased radicular symptoms. She is able to lift and carry light items with less often of dropping them. She is able to pick up 10# from floor and lift to shoulder height without pain today. Her right shoulder mobility has improved. She is walking approx 1 mile per day in total. She is completing light housework with improved tolerance. Overall she has MET or nearly Met all LTGS and is appropriate and  agreeable to discharge to HEP. She plans to return with referral for her ankle.   Review HEP and she is independent.   OBJECTIVE IMPAIRMENTS: decreased balance, decreased coordination, decreased endurance, decreased mobility, difficulty walking, decreased ROM, decreased strength, dizziness, hypomobility, increased fascial restrictions, impaired flexibility, impaired sensation, impaired UE functional use, postural dysfunction, obesity, and pain.   ACTIVITY LIMITATIONS: carrying, lifting, bending, sitting, standing, squatting, sleeping, transfers, bed mobility, reach over head, and locomotion level  PARTICIPATION LIMITATIONS: meal prep, cleaning, laundry, interpersonal relationship, driving, shopping, and community activity  PERSONAL FACTORS: Past/current experiences, Social background, Time since onset of injury/illness/exacerbation, and 3+ comorbidities: fibro, Rt ankle pain, L knee pain  are also affecting patient's functional outcome.   REHAB POTENTIAL: Good  CLINICAL DECISION MAKING: Evolving/moderate complexity  EVALUATION COMPLEXITY: Moderate   GOALS: Goals reviewed with patient? Yes SHORT TERM GOALS: Target date: 05/23/2023     Patient will be independent with home program for posture  strength and mobility.  Baseline: up to date  05/27/23: less compliant last week due to vertigo  07/14/23: Has to have the papers, but does complete them  Goal status: MET  2.  Patient will be able to report 3 self-care strategies for pain relief Baseline:  05/27/23: muscle rub, soft neck brace, neck pillow  Goal status: MET    3.  Patient will be able to complete functional testing /balance screen and have goals set Baseline:  Goal status:MET    LONG TERM GOALS: Target date: 06/06/2023   Patient will improve Foto score to 50% or better to demonstrate improved functional mobility Baseline:  49% NEARLY MET  2.  Pt will be able to walk on the treadmill 10 minutes every day to build the habit  of exercise Baseline: LE numbness increases  05/27/23: gave the T.M to son this weekend -interested in purchasing stepper  06/24/23: walks dog multiple times per day (at least a mile)  Goal status: MET  3.  Patient will be able to report sensory symptoms in upper body improved by 25% Baseline: daily, moderate throughout the day  06/24/23: Can lift and move the arm better. Less hand and finger swelling/Rx symptoms, 25-50% better.  Goal status: MET   4.  Patient will be able to complete light housework with no increase in pain Baseline: moderate pain in neck and UEs  06/24/23: improved, less dropping items Goal status: MET   5.  Patient will be able to fully perform active range of motion in cervical spine without increased pain Baseline: Pain with left rotation and sidebending right and left 10/1/2: WFL, min tightness turning to the right  Goal status: MET  6.  Patient will be able to lift 10 pounds from the floor to shoulder height x 10 to demo improved functional strength Baseline:  Goal status: MET   PLAN:  PT FREQUENCY: 2x/week  PT DURATION: 8 weeks  PLANNED INTERVENTIONS: Therapeutic exercises, Therapeutic activity, Neuromuscular re-education, Balance training, Gait training, Patient/Family education, Self Care, Joint mobilization, Aquatic Therapy, Dry Needling, Spinal mobilization, Cryotherapy, Moist heat, Taping, Manual therapy, and Re-evaluation  PLAN FOR NEXT SESSION: DC to HEP today  Jannette Spanner, PTA 06/24/23 3:52 PM Phone: [phone] Fax: [phone]

## 2023-06-24 NOTE — Progress Notes (Signed)
Office Visit Note   Patient: Karina Greer           Date of Birth: 1966-05-14           MRN: [id number] Visit Date: 06/24/2023              Requested by: Lonie Peak, PA-C [address] [place],  [place] [address] PCP: Lonie Peak, PA-C   Assessment & Plan: Visit Diagnoses:  1. Acute pain of right shoulder   2. S/P cervical spinal fusion     Plan: Patient's fused C5-6 C6-7 with some adjacent level narrowing and spurring at C4-5 if she has not had reimaging.  Principal problem is decreased shoulder range of motion which is now improved after therapy.  She will follow-up with me in 3 months.  Follow-Up Instructions: Return in about 3 months (around 09/24/2023).   Orders:  Orders Placed This Encounter  Procedures   XR Shoulder Right   No orders of the defined types were placed in this encounter.     Procedures: No procedures performed   Clinical Data: No additional findings.   Subjective: Chief Complaint  Patient presents with   Right Shoulder - Pain, Follow-up   Neck - Follow-up    HPI 57 year old female returns she states the injection her knee gave her good relief of pain has not bothered her since 04/22/2023.  She is going to therapy for her neck and right shoulder symptoms and states she can get her arm up overhead now.  She reminds me that she had surgery on her shoulder in [place] more than 25 years ago with rotator cuff repair.  X-rays today shows some rotator cuff calcification.  She is able to get her arm up overhead comfortably with minimal discomfort.  Still has a little discomfort when she reaches behind her but can reach to T10.  Patient states she has had foot problems did not have a tear but has follow-up visit with her podiatrist to review her MRI scan.  Patient is ambulating with a tennis shoe.  She states she is hoping the podiatrist sends her to therapy for her foot.  Review of Systems he is systems updated  unchanged.   Objective: Vital Signs: BP 120/89   Pulse 67   Physical Exam Constitutional:      Appearance: She is well-developed.  HENT:     Head: Normocephalic.     Right Ear: External ear normal.     Left Ear: External ear normal. There is no impacted cerumen.  Eyes:     Pupils: Pupils are equal, round, and reactive to light.  Neck:     Thyroid: No thyromegaly.     Trachea: No tracheal deviation.  Cardiovascular:     Rate and Rhythm: Normal rate.  Pulmonary:     Effort: Pulmonary effort is normal.  Abdominal:     Palpations: Abdomen is soft.  Musculoskeletal:     Cervical back: No rigidity.  Skin:    General: Skin is warm and dry.  Neurological:     Mental Status: She is alert and oriented to person, place, and time.  Psychiatric:        Behavior: Behavior normal.     Ortho Exam patient can get her arm from overhead easily.  Internal rotation hand to T12.  No problems reaching to her opposite shoulder.  Good cervical range of motion well-healed cervical incision.  Specialty Comments:  No specialty comments available.  Imaging: No results found.  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 [facility]) 01/09/2023   Erythrocytosis 01/23/2022   Pain in left ankle and joints of left foot 12/14/2021   Family history of blood clots 10/09/2021   Lateral epicondylitis, right elbow 05/25/2019   Atherosclerosis of aorta (HCC) 10/28/2018   Elevated glucose 10/28/2018   Mixed hyperlipidemia 10/28/2018   Hypothyroidism 07/20/2018   LLQ pain 12/03/2017   Pre-diabetes 06/03/2017   Rectal bleeding 06/02/2017   S/P cervical spinal fusion 03/11/2016   Bilateral hand pain 11/29/2015   Elevated C-reactive protein 11/29/2015   Elevated rheumatoid factor 11/29/2015   Raynaud's phenomenon without gangrene 11/29/2015   Bloating 09/12/2014   Dizziness 08/08/2014   Peri-menopause  08/08/2014   Chronic tension-type headache, intractable 07/13/2014   Gastroesophageal reflux disease without esophagitis 07/12/2014   Anxiety 04/18/2014   Chronic arthritis 04/18/2014   Cystitis 04/18/2014   Morbid obesity (HCC) 04/18/2014   Fibromyalgia 04/18/2014   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HCC) 04/18/2014   Osteoporosis, 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   Arthropathy 04/18/2014   Cephalalgia 04/04/2014   Numbness and tingling 04/04/2014   White matter abnormality on MRI of brain 02/23/2013   Nonspecific abnormal findings on radiological and other examination of skull and head 02/23/2013   Other nonspecific abnormal result of function study of brain and central nervous system 07/01/2012   Disturbance of skin sensation 07/01/2012   Migraine without aura 07/01/2012   Abdominal pain 02/11/2012   Chronic fatigue syndrome 02/11/2012   Chronic interstitial cystitis 02/11/2012   Dyspareunia 02/11/2012   Dysuria 02/11/2012   Female stress incontinence 02/11/2012   Irritable bowel syndrome with diarrhea 02/11/2012   Nausea and vomiting 02/11/2012   Nocturia 02/11/2012   Urge incontinence 02/11/2012   Urinary urgency 02/11/2012   Lumbar spondylosis 02/04/2012   Myalgia and myositis 02/04/2012   Depression 02/04/2012   Lumbosacral spondylosis without myelopathy 02/04/2012   OBESITY, UNSPECIFIED 10/03/2010   GENERALIZED ANXIETY DISORDER 10/03/2010   Chest pain 10/03/2010   Past Medical History:  Diagnosis Date   Anxiety    Anxiety disorder    Arthritis    Blepharitis    Chronic low back pain    Chronic pain    Depression    Diabetes mellitus without complication (HCC)    Dyslipidemia    Family history of blood clots    Fibromyalgia    GERD (gastroesophageal reflux disease)    Headache    hx migraines   History of kidney stones    History of panic attacks    History of renal calculi    IBS (irritable bowel syndrome)    Interstitial cystitis    Lupus    tested  positive for antibodies for lupus. 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    Family History  Problem Relation Age of Onset   Hypertension Mother    Diabetes Father    Brain cancer Father    Clotting  disorder Father        blood clots   Fibromyalgia Sister    Suicidality Sister    Hypertension Other        Cancer, Cerebrovascular disease run on mother side of family    Past Surgical History:  Procedure Laterality Date   ACHILLES TENDON SURGERY Right 3/16   ANTERIOR CERVICAL DECOMP/DISCECTOMY FUSION N/A 03/11/2016   Procedure: C5-6, C6-7 Anterior Cervical Discectomy and Fusion, Allograft, Plate;  Surgeon: Eldred Manges, MD;  Location: [facility] OR;  Service: Orthopedics;  Laterality: N/A;   CHOLECYSTECTOMY  2014   COLONOSCOPY WITH PROPOFOL N/A 08/25/2017   Procedure: COLONOSCOPY WITH PROPOFOL;  Surgeon: Scot Jun, MD;  Location: [facility] ENDOSCOPY;  Service: Endoscopy;  Laterality: N/A;   CYSTO WITH HYDRODISTENSION N/A 01/26/2014   Procedure: CYSTOSCOPY/HYDRODISTENSION;  Surgeon: [name], MD;  Location: [facility];  Service: Urology;  Laterality: N/A;   CYSTO/  URETHRAL DILATION/  HYDRODISTENTION/   INSTILLATION THERAPY  07-16-2010//   12-30-2007//   10-27-2006   ELBOW SURGERY Right    EXERCISE TOLERENCE TEST  10-12-2010   NEGATIVE  ADEQUATE ETT/  NO ISCHEMIA OR EVIDENCE HIGH GRADE OBSTRUCTIVE CAD/  NO FURTHER TEST NEEDED   HYSTEROSCOPY W/  NOVASURE ENDOMETRIAL ABLATION  2014   LAPAROSCOPIC OVARIAN CYST BX  2005   AND  URETEROSCOPIC LASER LITHO  STONE EXTRACTION   SHOULDER OPEN ROTATOR CUFF REPAIR Right 2004    TONSILLECTOMY     TRANSTHORACIC ECHOCARDIOGRAM  06-07-2006   normal study/  ef 60-65%   TUBAL LIGATION Bilateral 1995   Social History   Occupational History   Occupation: Designer, industrial/product: UNEMPLOYED    Comment: Disability  Tobacco Use   Smoking status: Never   Smokeless tobacco: Never  Vaping Use   Vaping status: Never Used  Substance and Sexual Activity   Alcohol use: No   Drug use: No   Sexual activity: Not on file

## 2023-06-26 ENCOUNTER — Encounter: Payer: Self-pay | Admitting: Physical Medicine and Rehabilitation

## 2023-06-26 ENCOUNTER — Other Ambulatory Visit: Payer: Self-pay

## 2023-06-26 ENCOUNTER — Encounter: Payer: Medicare Other | Attending: Physical Medicine & Rehabilitation | Admitting: Physical Medicine and Rehabilitation

## 2023-06-26 DIAGNOSIS — M797 Fibromyalgia: Secondary | ICD-10-CM | POA: Insufficient documentation

## 2023-06-26 DIAGNOSIS — M7660 Achilles tendinitis, unspecified leg: Secondary | ICD-10-CM | POA: Insufficient documentation

## 2023-06-26 MED ORDER — HYDROCODONE-ACETAMINOPHEN 10-325 MG PO TABS
1.0000 | ORAL_TABLET | Freq: Three times a day (TID) | ORAL | 0 refills | Status: DC | PRN
  Filled 2023-06-26 – 2023-07-22 (×3): qty 90, 30d supply, fill #0

## 2023-06-26 NOTE — Progress Notes (Signed)
.  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

## 2023-06-27 ENCOUNTER — Encounter: Payer: Medicare Other | Admitting: Registered Nurse

## 2023-07-02 ENCOUNTER — Encounter: Payer: Self-pay | Admitting: Podiatry

## 2023-07-02 ENCOUNTER — Ambulatory Visit (INDEPENDENT_AMBULATORY_CARE_PROVIDER_SITE_OTHER): Payer: Medicare Other | Admitting: Podiatry

## 2023-07-02 VITALS — Ht 63.0 in | Wt 227.0 lb

## 2023-07-02 DIAGNOSIS — M7662 Achilles tendinitis, left leg: Secondary | ICD-10-CM

## 2023-07-02 DIAGNOSIS — M722 Plantar fascial fibromatosis: Secondary | ICD-10-CM | POA: Diagnosis not present

## 2023-07-02 DIAGNOSIS — M7661 Achilles tendinitis, right leg: Secondary | ICD-10-CM | POA: Diagnosis not present

## 2023-07-02 NOTE — Progress Notes (Signed)
Subjective:   Patient ID: Karina Greer, female   DOB: 57 y.o.   MRN: [id number]   HPI Patient presents stating still having a lot of pain in the left heel and is here to review the MRI also is doing low level shockwave therapy at an office currently and is interested in PRP   ROS      Objective:  Physical Exam  Neurovascular status intact exquisite discomfort still noted posterior aspect right heel she had had a partial procedure done about 8 years ago with the left doing okay     Assessment:  Chronic posterior Achilles tendinitis right very painful     Plan:  H&P reviewed surgery could be done but it would be difficult and we will want to try anything we cannot avoid and she totally agrees she will finish her protocol of shockwave give it some time if not improved she will see Dr. Allena Katz for PRP injections and if not improved we will have to consider surgery for this condition.  Reviewed MRI indicating inflammation but did not indicate any other pathology

## 2023-07-03 DIAGNOSIS — H9202 Otalgia, left ear: Secondary | ICD-10-CM | POA: Diagnosis not present

## 2023-07-03 DIAGNOSIS — M26622 Arthralgia of left temporomandibular joint: Secondary | ICD-10-CM | POA: Diagnosis not present

## 2023-07-04 ENCOUNTER — Encounter: Payer: Medicare Other | Admitting: Physical Medicine and Rehabilitation

## 2023-07-04 ENCOUNTER — Encounter: Payer: Self-pay | Admitting: Physical Medicine and Rehabilitation

## 2023-07-04 VITALS — BP 130/88 | HR 62 | Ht 63.0 in | Wt 229.2 lb

## 2023-07-04 DIAGNOSIS — M7660 Achilles tendinitis, unspecified leg: Secondary | ICD-10-CM

## 2023-07-04 NOTE — Addendum Note (Signed)
Addended by: Horton Chin on: 07/04/2023 09:50 AM   Modules accepted: Orders

## 2023-07-04 NOTE — Progress Notes (Signed)
Chronic aliodynia, 3rd treatment  Frequency 10-16Hx Power 120 mJ 2000-3000 shicks

## 2023-07-11 ENCOUNTER — Encounter: Payer: Self-pay | Admitting: Physical Medicine and Rehabilitation

## 2023-07-11 ENCOUNTER — Encounter: Payer: Medicare Other | Admitting: Physical Medicine and Rehabilitation

## 2023-07-11 VITALS — BP 127/74 | HR 66 | Ht 63.0 in | Wt 228.0 lb

## 2023-07-11 DIAGNOSIS — M7660 Achilles tendinitis, unspecified leg: Secondary | ICD-10-CM

## 2023-07-11 NOTE — Progress Notes (Signed)
Chronic aliodynia, 4th treatment  Frequency 10-16Hx Power 120 mJ 2000-3000 shicks

## 2023-07-18 ENCOUNTER — Encounter: Payer: Medicare Other | Admitting: Physical Medicine and Rehabilitation

## 2023-07-18 VITALS — BP 132/85 | HR 67 | Ht 67.0 in | Wt 227.0 lb

## 2023-07-18 DIAGNOSIS — M7660 Achilles tendinitis, unspecified leg: Secondary | ICD-10-CM

## 2023-07-18 NOTE — Patient Instructions (Signed)
Dr. Josefine Class collagen [facility]

## 2023-07-18 NOTE — Progress Notes (Signed)
Chronic aliodynia, 5th treatment  Frequency 10-16Hx Power 120 mJ 2000-3000 shicks

## 2023-07-22 ENCOUNTER — Other Ambulatory Visit (HOSPITAL_COMMUNITY): Payer: Self-pay

## 2023-07-22 ENCOUNTER — Other Ambulatory Visit: Payer: Self-pay

## 2023-07-23 ENCOUNTER — Ambulatory Visit: Payer: Self-pay | Admitting: Podiatry

## 2023-07-28 ENCOUNTER — Ambulatory Visit: Payer: Medicare Other | Admitting: Physical Therapy

## 2023-08-05 ENCOUNTER — Encounter: Payer: Medicare Other | Attending: Physical Medicine & Rehabilitation | Admitting: Physical Medicine and Rehabilitation

## 2023-08-05 DIAGNOSIS — M722 Plantar fascial fibromatosis: Secondary | ICD-10-CM

## 2023-08-05 NOTE — Progress Notes (Signed)
Chronic plantar fascia, 1st treatment  Frequency 10 Power 90 mJ 3000 shocks

## 2023-08-07 DIAGNOSIS — K760 Fatty (change of) liver, not elsewhere classified: Secondary | ICD-10-CM | POA: Diagnosis not present

## 2023-08-07 DIAGNOSIS — Z87442 Personal history of urinary calculi: Secondary | ICD-10-CM | POA: Diagnosis not present

## 2023-08-07 DIAGNOSIS — R1032 Left lower quadrant pain: Secondary | ICD-10-CM | POA: Diagnosis not present

## 2023-08-07 DIAGNOSIS — Z860100 Personal history of colon polyps, unspecified: Secondary | ICD-10-CM | POA: Diagnosis not present

## 2023-08-07 DIAGNOSIS — K582 Mixed irritable bowel syndrome: Secondary | ICD-10-CM | POA: Diagnosis not present

## 2023-08-11 ENCOUNTER — Encounter: Payer: Medicare Other | Admitting: Physical Medicine and Rehabilitation

## 2023-08-11 VITALS — BP 133/73 | HR 84 | Ht 67.0 in | Wt 227.0 lb

## 2023-08-11 DIAGNOSIS — M722 Plantar fascial fibromatosis: Secondary | ICD-10-CM

## 2023-08-11 NOTE — Progress Notes (Signed)
Chronic plantar fascia, 2nd treatment  Frequency 10 Power 90 mJ 3000 shocks

## 2023-08-13 ENCOUNTER — Ambulatory Visit: Payer: Medicare Other

## 2023-08-18 ENCOUNTER — Other Ambulatory Visit: Payer: Self-pay | Admitting: Physical Medicine and Rehabilitation

## 2023-08-18 ENCOUNTER — Other Ambulatory Visit: Payer: Self-pay

## 2023-08-18 DIAGNOSIS — M797 Fibromyalgia: Secondary | ICD-10-CM

## 2023-08-18 MED ORDER — HYDROCODONE-ACETAMINOPHEN 10-325 MG PO TABS
1.0000 | ORAL_TABLET | Freq: Three times a day (TID) | ORAL | 0 refills | Status: DC | PRN
  Filled 2023-08-18 – 2023-08-19 (×2): qty 90, 30d supply, fill #0

## 2023-08-18 NOTE — Telephone Encounter (Signed)
PMP was Reviewed.  Hydrocodone e-scribed to pharmacy. Karina Greer has a scheduled appointment with Dr Carlis Abbott in December.  My-Chart message sent to Karina Greer regarding the above.

## 2023-08-18 NOTE — Telephone Encounter (Signed)
Patient asking for a refill on her Hydrocodone   Filled  Written  ID  Drug  QTY  Days  Prescriber  RX #  Dispenser  Refill  Daily Dose*  Pymt Type  PMP  07/22/2023 06/26/2023 2  Hydrocodone-Acetamin 10-325 Mg 90.00 30 Kr Rau [id number] Con (6112) 0/0 30.00 MME Other [place]

## 2023-08-19 ENCOUNTER — Other Ambulatory Visit: Payer: Self-pay

## 2023-08-20 ENCOUNTER — Telehealth: Payer: Self-pay

## 2023-08-20 ENCOUNTER — Other Ambulatory Visit: Payer: Self-pay

## 2023-08-20 NOTE — Telephone Encounter (Signed)
Karina Greer is requesting Qutenza on her next visit. Call back phone [phone].

## 2023-08-26 ENCOUNTER — Encounter: Payer: Self-pay | Admitting: Physical Medicine and Rehabilitation

## 2023-08-26 ENCOUNTER — Other Ambulatory Visit: Payer: Self-pay

## 2023-08-26 ENCOUNTER — Encounter: Payer: Medicare Other | Attending: Physical Medicine & Rehabilitation | Admitting: Physical Medicine and Rehabilitation

## 2023-08-26 VITALS — BP 124/83 | HR 71 | Ht 67.0 in | Wt 226.8 lb

## 2023-08-26 DIAGNOSIS — G609 Hereditary and idiopathic neuropathy, unspecified: Secondary | ICD-10-CM | POA: Insufficient documentation

## 2023-08-26 DIAGNOSIS — M7062 Trochanteric bursitis, left hip: Secondary | ICD-10-CM | POA: Diagnosis not present

## 2023-08-26 DIAGNOSIS — G894 Chronic pain syndrome: Secondary | ICD-10-CM | POA: Diagnosis not present

## 2023-08-26 DIAGNOSIS — M5416 Radiculopathy, lumbar region: Secondary | ICD-10-CM | POA: Insufficient documentation

## 2023-08-26 DIAGNOSIS — M542 Cervicalgia: Secondary | ICD-10-CM | POA: Insufficient documentation

## 2023-08-26 DIAGNOSIS — Z5181 Encounter for therapeutic drug level monitoring: Secondary | ICD-10-CM | POA: Insufficient documentation

## 2023-08-26 DIAGNOSIS — Z79899 Other long term (current) drug therapy: Secondary | ICD-10-CM | POA: Diagnosis not present

## 2023-08-26 DIAGNOSIS — M47816 Spondylosis without myelopathy or radiculopathy, lumbar region: Secondary | ICD-10-CM | POA: Insufficient documentation

## 2023-08-26 DIAGNOSIS — M5412 Radiculopathy, cervical region: Secondary | ICD-10-CM | POA: Diagnosis not present

## 2023-08-26 DIAGNOSIS — M797 Fibromyalgia: Secondary | ICD-10-CM | POA: Insufficient documentation

## 2023-08-26 MED ORDER — CAPSAICIN-CLEANSING GEL 8 % EX KIT
4.0000 | PACK | Freq: Once | CUTANEOUS | Status: AC
  Administered 2023-08-26: 4 via TOPICAL

## 2023-08-26 MED ORDER — HYDROXYZINE HCL 25 MG PO TABS
25.0000 mg | ORAL_TABLET | Freq: Three times a day (TID) | ORAL | 3 refills | Status: DC | PRN
  Filled 2023-08-26: qty 30, 10d supply, fill #0
  Filled 2023-09-18: qty 30, 10d supply, fill #1
  Filled 2023-10-13: qty 30, 10d supply, fill #2
  Filled 2023-11-18: qty 30, 10d supply, fill #3

## 2023-08-26 NOTE — Progress Notes (Signed)
-  Discussed Qutenza as an option for neuropathic pain control. Discussed that this is a capsaicin patch, stronger than capsaicin cream. Discussed that it is currently approved for diabetic peripheral neuropathy and post-herpetic neuralgia, but that it has also shown benefit in treating other forms of neuropathy. Provided patient with link to site to learn more about the patch: https://www.clark.biz/. Discussed that the patch would be placed in office and benefits usually last 3 months. Discussed that unintended exposure to capsaicin can cause severe irritation of eyes, mucous membranes, respiratory tract, and skin, but that Qutenza is a local treatment and does not have the systemic side effects of other nerve medications. Discussed that there may be pain, itching, erythema, and decreased sensory function associated with the application of Qutenza. Side effects usually subside within 1 week. A cold pack of analgesic medications can help with these side effects. Blood pressure can also be increased due to pain associated with administration of the patch.   4 patches of Qutenza [id number]) was applied to the area of pain. Ice packs were applied during the procedure to ensure patient comfort. Blood pressure was monitored every 15 minutes. The patient tolerated the procedure well. Post-procedure instructions were given and follow-up has been scheduled.  Topical system measures 14cm x20cm (280cm for a total 1120units) were applied which will cause deeper penetration for destruction of the peripheral nerve using a chemical (Qutenza) which infuses into the skin like an injection and heat technique (occlusive, compressive dressing cauing endothermic heat technique)

## 2023-08-26 NOTE — Addendum Note (Signed)
Addended by: Doreene Eland on: 08/26/2023 03:29 PM   Modules accepted: Orders

## 2023-09-01 ENCOUNTER — Ambulatory Visit: Payer: Medicare Other

## 2023-09-04 ENCOUNTER — Encounter: Payer: Medicare Other | Admitting: Physical Medicine and Rehabilitation

## 2023-09-08 ENCOUNTER — Other Ambulatory Visit: Payer: Self-pay | Admitting: Physical Medicine and Rehabilitation

## 2023-09-09 ENCOUNTER — Other Ambulatory Visit: Payer: Self-pay

## 2023-09-18 ENCOUNTER — Other Ambulatory Visit: Payer: Self-pay

## 2023-09-18 ENCOUNTER — Other Ambulatory Visit: Payer: Self-pay | Admitting: Physical Medicine and Rehabilitation

## 2023-09-18 ENCOUNTER — Other Ambulatory Visit: Payer: Self-pay | Admitting: Registered Nurse

## 2023-09-18 DIAGNOSIS — M797 Fibromyalgia: Secondary | ICD-10-CM

## 2023-09-18 MED ORDER — LIDOCAINE 5 % EX PTCH
1.0000 | MEDICATED_PATCH | CUTANEOUS | 3 refills | Status: AC
  Filled 2023-09-18 – 2023-09-19 (×2): qty 90, 90d supply, fill #0
  Filled 2024-04-09: qty 90, 90d supply, fill #1
  Filled 2024-08-26: qty 90, 90d supply, fill #2

## 2023-09-19 ENCOUNTER — Other Ambulatory Visit: Payer: Self-pay | Admitting: Physical Medicine and Rehabilitation

## 2023-09-19 ENCOUNTER — Other Ambulatory Visit: Payer: Self-pay

## 2023-09-19 ENCOUNTER — Telehealth: Payer: Self-pay

## 2023-09-19 DIAGNOSIS — M797 Fibromyalgia: Secondary | ICD-10-CM

## 2023-09-19 MED ORDER — HYDROCODONE-ACETAMINOPHEN 10-325 MG PO TABS
1.0000 | ORAL_TABLET | Freq: Three times a day (TID) | ORAL | 0 refills | Status: DC | PRN
  Filled 2023-09-19: qty 90, 30d supply, fill #0

## 2023-09-19 NOTE — Telephone Encounter (Signed)
PMP was Reviewed.  Hydrocodone e-scribed to pharmacy.  My-Chart message sent to Karina Greer regarding the above.

## 2023-09-19 NOTE — Telephone Encounter (Signed)
PA submitted for lidocaine patches   Karina Greer (Key: [id number])

## 2023-09-19 NOTE — Telephone Encounter (Signed)
  Patient called a Hydrocodone 10-325 MG refill :  PMP:  Filled  Written  ID  Drug  QTY  Days  Prescriber  RX #  Dispenser  Refill  Daily Dose*  Pymt Type  PMP  08/19/2023 08/18/2023 2  Hydrocodone-Acetamin 10-325 Mg 90.00 30 Eu Tho [id number] Con (6112) 0/0 30.00 MME Other [place] 07/22/2023 06/26/2023 2  Hydrocodone-Acetamin 10-325 Mg 90.00 30 Kr Rau [id number] Con (6112) 0/0 30.00 MME Other [place] 06/23/2023 06/20/2023 2  Hydrocodone-Acetamin 10-325 Mg 90.00 30 Kr Rau [id number] Con (6112) 0/0 30.00 MME Other [place]

## 2023-09-23 ENCOUNTER — Encounter: Payer: Self-pay | Admitting: Registered Nurse

## 2023-09-23 ENCOUNTER — Ambulatory Visit: Payer: Medicare Other | Admitting: Orthopaedic Surgery

## 2023-09-23 ENCOUNTER — Encounter: Payer: Medicare Other | Admitting: Registered Nurse

## 2023-09-23 ENCOUNTER — Encounter (HOSPITAL_BASED_OUTPATIENT_CLINIC_OR_DEPARTMENT_OTHER): Payer: Medicare Other | Admitting: Registered Nurse

## 2023-09-23 VITALS — BP 149/75 | HR 71 | Ht 67.0 in | Wt 224.0 lb

## 2023-09-23 VITALS — BP 143/79 | HR 74 | Ht 67.0 in | Wt 226.0 lb

## 2023-09-23 DIAGNOSIS — Z5181 Encounter for therapeutic drug level monitoring: Secondary | ICD-10-CM

## 2023-09-23 DIAGNOSIS — M7541 Impingement syndrome of right shoulder: Secondary | ICD-10-CM | POA: Diagnosis not present

## 2023-09-23 DIAGNOSIS — M797 Fibromyalgia: Secondary | ICD-10-CM | POA: Diagnosis not present

## 2023-09-23 DIAGNOSIS — Z79899 Other long term (current) drug therapy: Secondary | ICD-10-CM

## 2023-09-23 DIAGNOSIS — M7062 Trochanteric bursitis, left hip: Secondary | ICD-10-CM | POA: Diagnosis not present

## 2023-09-23 DIAGNOSIS — M5412 Radiculopathy, cervical region: Secondary | ICD-10-CM | POA: Diagnosis not present

## 2023-09-23 DIAGNOSIS — M5416 Radiculopathy, lumbar region: Secondary | ICD-10-CM

## 2023-09-23 DIAGNOSIS — M47816 Spondylosis without myelopathy or radiculopathy, lumbar region: Secondary | ICD-10-CM | POA: Diagnosis not present

## 2023-09-23 DIAGNOSIS — M542 Cervicalgia: Secondary | ICD-10-CM

## 2023-09-23 DIAGNOSIS — G894 Chronic pain syndrome: Secondary | ICD-10-CM | POA: Diagnosis not present

## 2023-09-23 DIAGNOSIS — G609 Hereditary and idiopathic neuropathy, unspecified: Secondary | ICD-10-CM | POA: Diagnosis not present

## 2023-09-23 MED ORDER — METHYLPREDNISOLONE ACETATE 40 MG/ML IJ SUSP
40.0000 mg | INTRAMUSCULAR | Status: AC | PRN
  Administered 2023-09-23: 40 mg via INTRA_ARTICULAR

## 2023-09-23 MED ORDER — BUPIVACAINE HCL 0.25 % IJ SOLN
4.0000 mL | INTRAMUSCULAR | Status: AC | PRN
  Administered 2023-09-23: 4 mL via INTRA_ARTICULAR

## 2023-09-23 MED ORDER — LIDOCAINE HCL 1 % IJ SOLN
0.5000 mL | INTRAMUSCULAR | Status: AC | PRN
  Administered 2023-09-23: .5 mL

## 2023-09-23 NOTE — Progress Notes (Signed)
 Subjective:    Patient ID: Karina Greer, female    DOB: 1966-05-25, 57 y.o.   MRN: [id number]  HPI: Karina Greer is a 57 y.o. female who returns for follow up appointment for chronic pain and medication refill. She states her pain is located in her neck radiating into her right shoulder and lower back pain radiating into her left hip. She rates her pain 6. Her current exercise regime is walking and performing stretching exercises.  Karina Greer Morphine equivalent is 30.00 MME.   UDS ordered today.    Pain Inventory Average Pain 8 Pain Right Now 6 My pain is sharp, burning, dull, stabbing, tingling, and aching  In the last 24 hours, has pain interfered with the following? General activity 9 Relation with others 9 Enjoyment of life 5 What TIME of day is your pain at its worst? morning , daytime, evening, and night Sleep (in general) Fair  Pain is worse with: walking, bending, sitting, inactivity, standing, and some activites Pain improves with: rest, heat/ice, therapy/exercise, pacing activities, and medication Relief from Meds: 6  Family History  Problem Relation Age of Onset   Hypertension Mother    Diabetes Father    Brain cancer Father    Clotting disorder Father        blood clots   Fibromyalgia Sister    Suicidality Sister    Hypertension Other        Cancer, Cerebrovascular disease run on mother side of family   Social History   Socioeconomic History   Marital status: Divorced    Spouse name: Not on file   Number of children: 3   Years of education: HS   Highest education level: 12th grade  Occupational History   Occupation: Designer, Industrial/product: UNEMPLOYED    Comment: Disability  Tobacco Use   Smoking status: Never   Smokeless tobacco: Never  Vaping Use   Vaping status: Never Used  Substance and Sexual Activity   Alcohol  use: No   Drug use: No   Sexual activity: Not on file  Other Topics Concern   Not on file  Social History Narrative    Patient is right-handed. She avoids caffeine. She has recently been using the treadmill.   Social Drivers of Corporate Investment Banker Strain: Not on file  Food Insecurity: Not on file  Transportation Needs: Not on file  Physical Activity: Not on file  Stress: Not on file  Social Connections: Not on file   Past Surgical History:  Procedure Laterality Date   ACHILLES TENDON SURGERY Right 3/16   ANTERIOR CERVICAL DECOMP/DISCECTOMY FUSION N/A 03/11/2016   Procedure: C5-6, C6-7 Anterior Cervical Discectomy and Fusion, Allograft, Plate;  Surgeon: Oneil JAYSON Herald, MD;  Location: [facility] OR;  Service: Orthopedics;  Laterality: N/A;   CHOLECYSTECTOMY  2014   COLONOSCOPY WITH PROPOFOL  N/A 08/25/2017   Procedure: COLONOSCOPY WITH PROPOFOL ;  Surgeon: Viktoria Lamar DASEN, MD;  Location: [facility] ENDOSCOPY;  Service: Endoscopy;  Laterality: N/A;   CYSTO WITH HYDRODISTENSION N/A 01/26/2014   Procedure: CYSTOSCOPY/HYDRODISTENSION;  Surgeon: Alm GORMAN Fragmin, MD;  Location: [facility];  Service: Urology;  Laterality: N/A;   CYSTO/  URETHRAL DILATION/  HYDRODISTENTION/   INSTILLATION THERAPY  07-16-2010//   12-30-2007//   10-27-2006   ELBOW SURGERY Right    EXERCISE TOLERENCE TEST  10-12-2010   NEGATIVE  ADEQUATE ETT/  NO ISCHEMIA OR EVIDENCE HIGH GRADE OBSTRUCTIVE CAD/  NO FURTHER TEST NEEDED   HYSTEROSCOPY  W/  NOVASURE ENDOMETRIAL ABLATION  2014   LAPAROSCOPIC OVARIAN CYST BX  2005   AND  URETEROSCOPIC LASER LITHO  STONE EXTRACTION   SHOULDER OPEN ROTATOR CUFF REPAIR Right 2004   TONSILLECTOMY     TRANSTHORACIC ECHOCARDIOGRAM  06-07-2006   normal study/  ef 60-65%   TUBAL LIGATION Bilateral 1995   Past Surgical History:  Procedure Laterality Date   ACHILLES TENDON SURGERY Right 3/16   ANTERIOR CERVICAL DECOMP/DISCECTOMY FUSION N/A 03/11/2016   Procedure: C5-6, C6-7 Anterior Cervical Discectomy and Fusion, Allograft, Plate;  Surgeon: Oneil JAYSON Herald, MD;  Location: [facility] OR;  Service: Orthopedics;   Laterality: N/A;   CHOLECYSTECTOMY  2014   COLONOSCOPY WITH PROPOFOL  N/A 08/25/2017   Procedure: COLONOSCOPY WITH PROPOFOL ;  Surgeon: Viktoria Lamar DASEN, MD;  Location: [facility] ENDOSCOPY;  Service: Endoscopy;  Laterality: N/A;   CYSTO WITH HYDRODISTENSION N/A 01/26/2014   Procedure: CYSTOSCOPY/HYDRODISTENSION;  Surgeon: Alm GORMAN Fragmin, MD;  Location: [facility];  Service: Urology;  Laterality: N/A;   CYSTO/  URETHRAL DILATION/  HYDRODISTENTION/   INSTILLATION THERAPY  07-16-2010//   12-30-2007//   10-27-2006   ELBOW SURGERY Right    EXERCISE TOLERENCE TEST  10-12-2010   NEGATIVE  ADEQUATE ETT/  NO ISCHEMIA OR EVIDENCE HIGH GRADE OBSTRUCTIVE CAD/  NO FURTHER TEST NEEDED   HYSTEROSCOPY W/  NOVASURE ENDOMETRIAL ABLATION  2014   LAPAROSCOPIC OVARIAN CYST BX  2005   AND  URETEROSCOPIC LASER LITHO  STONE EXTRACTION   SHOULDER OPEN ROTATOR CUFF REPAIR Right 2004   TONSILLECTOMY     TRANSTHORACIC ECHOCARDIOGRAM  06-07-2006   normal study/  ef 60-65%   TUBAL LIGATION Bilateral 1995   Past Medical History:  Diagnosis Date   Anxiety    Anxiety disorder    Arthritis    Blepharitis    Chronic low back pain    Chronic pain    Depression    Diabetes mellitus without complication (HCC)    Dyslipidemia    Family history of blood clots    Fibromyalgia    GERD (gastroesophageal reflux disease)    Headache    hx migraines   History of kidney stones    History of panic attacks    History of renal calculi    IBS (irritable bowel syndrome)    Interstitial cystitis    Lupus    tested positive for antibodies for lupus. dr to do further tests   Menorrhagia    OSA on CPAP    not used cpap for several weeks   Pneumonia    hx   PONV (postoperative nausea and vomiting)    RLS (restless legs syndrome)    Rosacea    Seasonal asthma    SI (sacroiliac) joint dysfunction    SUI (stress urinary incontinence, female)    UTI (lower urinary tract infection)    hx   White matter abnormality  on MRI of brain 02/23/2013   BP (!) 149/75   Pulse 71   Ht 5' 7 (1.702 m)   Wt 224 lb (101.6 kg)   SpO2 97%   BMI 35.08 kg/m   Opioid Risk Score:   Fall Risk Score:  `1  Depression screen [facility] 2/9     07/11/2023    9:40 AM 07/04/2023    9:10 AM 06/26/2023   11:13 AM 05/14/2023    9:07 AM 02/24/2023    8:38 AM 01/24/2023    9:02 AM 12/27/2022    8:42 AM  Depression  screen PHQ 2/9  Decreased Interest 0 0 0 0 0 0 0  Down, Depressed, Hopeless 0 0 0 0 0 0 0  PHQ - 2 Score 0 0 0 0 0 0 0      Review of Systems  Musculoskeletal:  Positive for myalgias.  All other systems reviewed and are negative.      Objective:   Physical Exam Vitals and nursing note reviewed.  Constitutional:      Appearance: Normal appearance.  Neck:     Comments: Cervical Paraspinal Tenderness: C-5-C-6  Cardiovascular:     Rate and Rhythm: Normal rate and regular rhythm.     Pulses: Normal pulses.     Heart sounds: Normal heart sounds.  Pulmonary:     Effort: Pulmonary effort is normal.     Breath sounds: Normal breath sounds.  Musculoskeletal:     Comments: Normal Muscle Bulk and Muscle Testing Reveals:  Upper Extremities: Right:  Decreased ROM  90 Degrees and Muscle Strength 5/5 Left Upper Extremity: Full ROM and Muscle Strength 5/5 Bilateral AC Joint Tenderness  Thoracic and  Lumbar Hypersensitivity Left Greater Trochanter Tenderness Lower Extremities: Full ROM and Muscle strength 5/5 Arises from chair with ease Narrow Based  Gait     Skin:    General: Skin is warm and dry.  Neurological:     Mental Status: She is alert and oriented to person, place, and time.  Psychiatric:        Mood and Affect: Mood normal.        Behavior: Behavior normal.         Assessment & Plan:  1. Lumbago/ Lumbar Spondylosis/ Right  Lumbar Radiculitis: Continue to Monitor. 12/31/ 2024 Continue : HYDROcodone  10/325mg  one tablet every 8 hours as needed #90.  We will continue the opioid monitoring program,  this consists of regular clinic visits, examinations, urine drug screen, pill counts as well as use of [place]  Controlled Substance Reporting system. A 12 month History has been reviewed on the [place]  Controlled Substance Reporting System on 09/23/2023. 2. Fibromyalgia. Continue Current exercise Regime. 09/23/2023 3. Anxiety and depression:Psychiatry Following: Counselor Jessica  at [facility] . Continue Counseling at [facility]. 09/23/2023 4. Migraines/ Frontal Headache: On Maxalt . Keep a headache Journal. Schedule an appointment with Dr Lorilee to discuss Migraine treatment, she verbalizes understanding. Neurology Following. 09/23/2023 5. OSA : PCP Following. Continue to Monitor. 09/23/2023 6. Obesity: Dr Lorilee Following: Continue  Healthy Diet Regimen and Continue HEP as Tolerated. 09/23/2023 7. Status Post Cervical Spinal Fusion: C5C6- C-6- C-7 Anterior Cervical Discectomy Fusion and Allograft Plate: Dr. Barbarann Following. 09/23/2023 8. Cervicalgia/ Cervical Radiculitis/ S/P Cervical Spinal Fusion:  Continue to Monitor Dr. Barbarann Following. 09/23/2023 9. Muscle Spasm: Continue Tizanidine  as needed.09/23/2023 10.Hereditary and Idiopathic Peripheral Neuropathy Continue with Tens Unit. 09/23/2023. 11.Left Greater Trochanteric Bursitis: . Continue to Alternate Ice and heat Therapy. Continue to Monitor. 09/23/2023. 12. Chronic Bilateral Knee Pain: No Complaints Today. Ortho Following. Continue HEP as Tolerated. Continue to Monitor. 09/23/2023 13. Lateral Epicondylitis of Right Elbow: No complaints today.  Ortho Following. S/P   06/26/2020 Right lateral epicondyle debridement, drilling and repair  By Dr Barbarann. 14. Neuralgia/ Right Facial Droop: Neurology following. Continue to monitor. 09/23/2023. 15. Polyarthralgia: No complaints today. Continue HEP as tolerated. Continue to monitor. 09/23/2023  16. Paresthesia of Right Lower Extremity: No complaints today. Occasionally:  Continue current medication regimen. Continue to monitor. 09/23/2023 17 . Right Foot Pain/ Chronic Right Ankle Pain:  Podiatry following. Dr Regal.We will continue to Monitor. 09/23/2023 .    F/U in 1 month .

## 2023-09-23 NOTE — Addendum Note (Signed)
 Addended by: Silas [place] T on: 09/23/2023 03:19 PM   Modules accepted: Orders

## 2023-09-23 NOTE — Progress Notes (Signed)
 Office Visit Note   Patient: Karina Greer           Date of Birth: 15-May-1966           MRN: [id number] Visit Date: 09/23/2023              Requested by: Montey Lot, PA-C [address] [place],  [place] [address] PCP: Montey Lot, PA-C   Assessment & Plan: Visit Diagnoses:  1. Impingement syndrome of right shoulder     Plan: Patient's range of motion of her shoulder improved with physical therapy.  She can get her arm up overhead still has some impingement likely some tendinopathy with possible partial or small full-thickness tear post repair around 1998 or 1999 surgery done elsewhere.  Subacromial injection performed which she tolerated well if she has persistent problems to call let us  know and we can proceed with MRI imaging studies.  No metal anchors are present in her shoulder.  Follow-Up Instructions: No follow-ups on file.   Orders:  Orders Placed This Encounter  Procedures   Large Joint Inj   No orders of the defined types were placed in this encounter.     Procedures: Large Joint Inj: R subacromial bursa on 09/23/2023 1:29 PM Indications: pain Details: 22 G 1.5 in needle  Arthrogram: No  Medications: 4 mL bupivacaine  0.25 %; 40 mg methylPREDNISolone  acetate 40 MG/ML; 0.5 mL lidocaine  1 % Outcome: tolerated well, no immediate complications Procedure, treatment alternatives, risks and benefits explained, specific risks discussed. Consent was given by the patient. Immediately prior to procedure a time out was called to verify the correct patient, procedure, equipment, support staff and site/side marked as required. Patient was prepped and draped in the usual sterile fashion.       Clinical Data: No additional findings.   Subjective: Chief Complaint  Patient presents with   Right Shoulder - Pain   Neck - Pain    HPI 57 year old female returns she has been through therapy ongoing problems with outstretched reaching right shoulder and overhead  activity washing her hair etc.  Previous rotator cuff repair many years ago and plain radiograph showed 2 cm x 8 mm area of calcific tendinopathy involving the supraspinatus tendon.  She had previous cervical neck fusion has some adjacent spondylosis above her solid fusion.  She denies problems with rotation of her neck denies numbness problems in her hand.  Review of Systems all other systems noncontributory to HPI.   Objective: Vital Signs: BP (!) 143/79   Pulse 74   Ht 5' 7 (1.702 m)   Wt 226 lb (102.5 kg)   BMI 35.40 kg/m   Physical Exam Constitutional:      Appearance: She is well-developed.  HENT:     Head: Normocephalic.     Right Ear: External ear normal.     Left Ear: External ear normal. There is no impacted cerumen.  Eyes:     Pupils: Pupils are equal, round, and reactive to light.  Neck:     Thyroid : No thyromegaly.     Trachea: No tracheal deviation.  Cardiovascular:     Rate and Rhythm: Normal rate.  Pulmonary:     Effort: Pulmonary effort is normal.  Abdominal:     Palpations: Abdomen is soft.  Musculoskeletal:     Cervical back: No rigidity.  Skin:    General: Skin is warm and dry.  Neurological:     Mental Status: She is alert and oriented to person, place, and  time.  Psychiatric:        Behavior: Behavior normal.     Ortho Exam positive impingement negative drop arm test.  Oblique incision running from the [facility] joint anterolateral over the deltoid well-healed.  No distal biceps muscle migration.  Opposite left shoulder shows good range of motion.  Well-healed incision lateral epicondyle right elbow.  No tenderness of the bicipital groove.  Specialty Comments:  No specialty comments available.  Imaging: No results found.   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 [facility]) 01/09/2023   Erythrocytosis  01/23/2022   Pain in left ankle and joints of left foot 12/14/2021   Family history of blood clots 10/09/2021   Lateral epicondylitis, right elbow 05/25/2019   Atherosclerosis of aorta (HCC) 10/28/2018   Elevated glucose 10/28/2018   Mixed hyperlipidemia 10/28/2018   Hypothyroidism 07/20/2018   LLQ pain 12/03/2017   Pre-diabetes 06/03/2017   Rectal bleeding 06/02/2017   S/P cervical spinal fusion 03/11/2016   Bilateral hand pain 11/29/2015   Elevated C-reactive protein 11/29/2015   Elevated rheumatoid factor 11/29/2015   Raynaud's phenomenon without gangrene 11/29/2015   Bloating 09/12/2014   Dizziness 08/08/2014   Peri-menopause 08/08/2014   Chronic tension-type headache, intractable 07/13/2014   Gastroesophageal reflux disease without esophagitis 07/12/2014   Anxiety 04/18/2014   Chronic arthritis 04/18/2014   Cystitis 04/18/2014   Morbid obesity (HCC) 04/18/2014   Fibromyalgia 04/18/2014   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HCC) 04/18/2014   Osteoporosis, 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   Arthropathy 04/18/2014   Cephalalgia 04/04/2014   Numbness and tingling 04/04/2014   White matter abnormality on MRI of brain 02/23/2013   Nonspecific abnormal findings on radiological and other examination of skull and head 02/23/2013   Other nonspecific abnormal result of function study of brain and central nervous system 07/01/2012   Disturbance of skin sensation 07/01/2012   Migraine without aura 07/01/2012   Abdominal pain 02/11/2012   Chronic fatigue syndrome 02/11/2012   Chronic interstitial cystitis 02/11/2012   Dyspareunia 02/11/2012   Dysuria 02/11/2012   Female stress incontinence 02/11/2012   Irritable bowel syndrome with diarrhea 02/11/2012   Nausea and vomiting 02/11/2012   Nocturia 02/11/2012   Urge incontinence 02/11/2012   Urinary urgency 02/11/2012   Lumbar spondylosis 02/04/2012   Myalgia and myositis 02/04/2012   Depression 02/04/2012   Lumbosacral spondylosis  without myelopathy 02/04/2012   OBESITY, UNSPECIFIED 10/03/2010   GENERALIZED ANXIETY DISORDER 10/03/2010   Chest pain 10/03/2010   Past Medical History:  Diagnosis Date   Anxiety    Anxiety disorder    Arthritis    Blepharitis    Chronic low back pain    Chronic pain    Depression    Diabetes mellitus without complication (HCC)    Dyslipidemia    Family history of blood clots    Fibromyalgia    GERD (gastroesophageal reflux disease)    Headache    hx migraines   History of kidney stones    History of panic attacks    History of renal calculi    IBS (irritable bowel syndrome)    Interstitial cystitis    Lupus    tested positive for antibodies for lupus. dr to do further tests   Menorrhagia    OSA on CPAP    not used cpap for several weeks   Pneumonia    hx  PONV (postoperative nausea and vomiting)    RLS (restless legs syndrome)    Rosacea    Seasonal asthma    SI (sacroiliac) joint dysfunction    SUI (stress urinary incontinence, female)    UTI (lower urinary tract infection)    hx   White matter abnormality on MRI of brain 02/23/2013    Family History  Problem Relation Age of Onset   Hypertension Mother    Diabetes Father    Brain cancer Father    Clotting disorder Father        blood clots   Fibromyalgia Sister    Suicidality Sister    Hypertension Other        Cancer, Cerebrovascular disease run on mother side of family    Past Surgical History:  Procedure Laterality Date   ACHILLES TENDON SURGERY Right 3/16   ANTERIOR CERVICAL DECOMP/DISCECTOMY FUSION N/A 03/11/2016   Procedure: C5-6, C6-7 Anterior Cervical Discectomy and Fusion, Allograft, Plate;  Surgeon: Oneil JAYSON Herald, MD;  Location: [facility] OR;  Service: Orthopedics;  Laterality: N/A;   CHOLECYSTECTOMY  2014   COLONOSCOPY WITH PROPOFOL  N/A 08/25/2017   Procedure: COLONOSCOPY WITH PROPOFOL ;  Surgeon: Viktoria Lamar DASEN, MD;  Location: [facility] ENDOSCOPY;  Service: Endoscopy;  Laterality: N/A;   CYSTO WITH  HYDRODISTENSION N/A 01/26/2014   Procedure: CYSTOSCOPY/HYDRODISTENSION;  Surgeon: Alm GORMAN Fragmin, MD;  Location: [facility];  Service: Urology;  Laterality: N/A;   CYSTO/  URETHRAL DILATION/  HYDRODISTENTION/   INSTILLATION THERAPY  07-16-2010//   12-30-2007//   10-27-2006   ELBOW SURGERY Right    EXERCISE TOLERENCE TEST  10-12-2010   NEGATIVE  ADEQUATE ETT/  NO ISCHEMIA OR EVIDENCE HIGH GRADE OBSTRUCTIVE CAD/  NO FURTHER TEST NEEDED   HYSTEROSCOPY W/  NOVASURE ENDOMETRIAL ABLATION  2014   LAPAROSCOPIC OVARIAN CYST BX  2005   AND  URETEROSCOPIC LASER LITHO  STONE EXTRACTION   SHOULDER OPEN ROTATOR CUFF REPAIR Right 2004   TONSILLECTOMY     TRANSTHORACIC ECHOCARDIOGRAM  06-07-2006   normal study/  ef 60-65%   TUBAL LIGATION Bilateral 1995   Social History   Occupational History   Occupation: Designer, Industrial/product: UNEMPLOYED    Comment: Disability  Tobacco Use   Smoking status: Never   Smokeless tobacco: Never  Vaping Use   Vaping status: Never Used  Substance and Sexual Activity   Alcohol  use: No   Drug use: No   Sexual activity: Not on file

## 2023-09-26 DIAGNOSIS — R300 Dysuria: Secondary | ICD-10-CM | POA: Diagnosis not present

## 2023-09-26 DIAGNOSIS — B9689 Other specified bacterial agents as the cause of diseases classified elsewhere: Secondary | ICD-10-CM | POA: Diagnosis not present

## 2023-09-26 DIAGNOSIS — N760 Acute vaginitis: Secondary | ICD-10-CM | POA: Diagnosis not present

## 2023-09-26 LAB — DRUG TOX MONITOR 1 W/CONF, ORAL FLD
Amphetamines: NEGATIVE ng/mL (ref <10)
Barbiturates: NEGATIVE ng/mL (ref <10)
Benzodiazepines: NEGATIVE ng/mL (ref <0.50)
Buprenorphine: NEGATIVE ng/mL (ref <0.10)
Cocaine: NEGATIVE ng/mL (ref <5.0)
Codeine: NEGATIVE ng/mL (ref <2.5)
Dihydrocodeine: NEGATIVE ng/mL (ref <2.5)
Fentanyl: NEGATIVE ng/mL (ref <0.10)
Heroin Metabolite: NEGATIVE ng/mL (ref <1.0)
Hydrocodone: 24.7 ng/mL — ABNORMAL HIGH (ref <2.5)
Hydromorphone: NEGATIVE ng/mL (ref <2.5)
MARIJUANA: NEGATIVE ng/mL (ref <2.5)
MDMA: NEGATIVE ng/mL (ref <10)
Meprobamate: NEGATIVE ng/mL (ref <2.5)
Methadone: NEGATIVE ng/mL (ref <5.0)
Morphine: NEGATIVE ng/mL (ref <2.5)
Nicotine Metabolite: NEGATIVE ng/mL (ref <5.0)
Norhydrocodone: NEGATIVE ng/mL (ref <2.5)
Noroxycodone: NEGATIVE ng/mL (ref <2.5)
Opiates: POSITIVE ng/mL — AB (ref <2.5)
Oxycodone: NEGATIVE ng/mL (ref <2.5)
Oxymorphone: NEGATIVE ng/mL (ref <2.5)
Phencyclidine: NEGATIVE ng/mL (ref <10)
Tapentadol: NEGATIVE ng/mL (ref <5.0)
Tramadol: NEGATIVE ng/mL (ref <5.0)
Zolpidem: NEGATIVE ng/mL (ref <5.0)

## 2023-09-26 LAB — DRUG TOX ALC METAB W/CON, ORAL FLD: Alcohol Metabolite: NEGATIVE ng/mL (ref <25)

## 2023-09-30 ENCOUNTER — Ambulatory Visit: Payer: Medicare Other | Admitting: Orthopaedic Surgery

## 2023-10-17 ENCOUNTER — Encounter: Payer: Medicare Other | Attending: Physical Medicine & Rehabilitation | Admitting: Registered Nurse

## 2023-10-17 ENCOUNTER — Encounter: Payer: Self-pay | Admitting: Registered Nurse

## 2023-10-17 ENCOUNTER — Other Ambulatory Visit: Payer: Self-pay

## 2023-10-17 VITALS — BP 134/81 | HR 78 | Ht 67.0 in | Wt 225.0 lb

## 2023-10-17 DIAGNOSIS — Z5181 Encounter for therapeutic drug level monitoring: Secondary | ICD-10-CM | POA: Diagnosis not present

## 2023-10-17 DIAGNOSIS — G894 Chronic pain syndrome: Secondary | ICD-10-CM | POA: Diagnosis not present

## 2023-10-17 DIAGNOSIS — Z79899 Other long term (current) drug therapy: Secondary | ICD-10-CM | POA: Insufficient documentation

## 2023-10-17 DIAGNOSIS — M7062 Trochanteric bursitis, left hip: Secondary | ICD-10-CM | POA: Insufficient documentation

## 2023-10-17 DIAGNOSIS — M542 Cervicalgia: Secondary | ICD-10-CM | POA: Insufficient documentation

## 2023-10-17 DIAGNOSIS — M797 Fibromyalgia: Secondary | ICD-10-CM | POA: Insufficient documentation

## 2023-10-17 DIAGNOSIS — M5416 Radiculopathy, lumbar region: Secondary | ICD-10-CM | POA: Insufficient documentation

## 2023-10-17 MED ORDER — HYDROCODONE-ACETAMINOPHEN 10-325 MG PO TABS
1.0000 | ORAL_TABLET | Freq: Three times a day (TID) | ORAL | 0 refills | Status: DC | PRN
  Filled 2023-10-17: qty 90, 30d supply, fill #0

## 2023-10-17 NOTE — Progress Notes (Unsigned)
Subjective:    Patient ID: Karina Greer, female    DOB: Jul 31, 1966, 58 y.o.   MRN: [id number]  HPI: Karina Greer is a 58 y.o. female who returns for follow up appointment for chronic pain and medication refill. states *** pain is located in  ***. rates pain ***. current exercise regime is walking and performing stretching exercises.  Karina Greer Morphine equivalent is *** MME.   Last Oral Swab was Performed on 09/23/2023, it was consistent.    Pain Inventory Average Pain 8 Pain Right Now 7 My pain is sharp, burning, dull, stabbing, tingling, aching, and pulsating  In the last 24 hours, has pain interfered with the following? General activity 7 Relation with others 7 Enjoyment of life 7 What TIME of day is your pain at its worst? morning  and night Sleep (in general) Fair  Pain is worse with: bending, standing, and cold weather Pain improves with: heat/ice and medication Relief from Meds: 5  Family History  Problem Relation Age of Onset   Hypertension Mother    Diabetes Father    Brain cancer Father    Clotting disorder Father        blood clots   Fibromyalgia Sister    Suicidality Sister    Hypertension Other        Cancer, Cerebrovascular disease run on mother side of family   Social History   Socioeconomic History   Marital status: Divorced    Spouse name: Not on file   Number of children: 3   Years of education: HS   Highest education level: 12th grade  Occupational History   Occupation: Designer, industrial/product: UNEMPLOYED    Comment: Disability  Tobacco Use   Smoking status: Never   Smokeless tobacco: Never  Vaping Use   Vaping status: Never Used  Substance and Sexual Activity   Alcohol use: No   Drug use: No   Sexual activity: Not on file  Other Topics Concern   Not on file  Social History Narrative   Patient is right-handed. She avoids caffeine. She has recently been using the treadmill.   Social Drivers of Corporate investment banker Strain:  Not on file  Food Insecurity: Not on file  Transportation Needs: Not on file  Physical Activity: Not on file  Stress: Not on file  Social Connections: Not on file   Past Surgical History:  Procedure Laterality Date   ACHILLES TENDON SURGERY Right 3/16   ANTERIOR CERVICAL DECOMP/DISCECTOMY FUSION N/A 03/11/2016   Procedure: C5-6, C6-7 Anterior Cervical Discectomy and Fusion, Allograft, Plate;  Surgeon: Eldred Manges, MD;  Location: [facility] OR;  Service: Orthopedics;  Laterality: N/A;   CHOLECYSTECTOMY  2014   COLONOSCOPY WITH PROPOFOL N/A 08/25/2017   Procedure: COLONOSCOPY WITH PROPOFOL;  Surgeon: Scot Jun, MD;  Location[facility] ENDOSCOPY;  Service: Endoscopy;  Laterality: N/A;   CYSTO WITH HYDRODISTENSION N/A 01/26/2014   Procedure: CYSTOSCOPY/HYDRODISTENSION;  Surgeon: [name], MD;  Location: [facility];  Service: Urology;  Laterality: N/A;   CYSTO/  URETHRAL DILATION/  HYDRODISTENTION/   INSTILLATION THERAPY  07-16-2010//   12-30-2007//   10-27-2006   ELBOW SURGERY Right    EXERCISE TOLERENCE TEST  10-12-2010   NEGATIVE  ADEQUATE ETT/  NO ISCHEMIA OR EVIDENCE HIGH GRADE OBSTRUCTIVE CAD/  NO FURTHER TEST NEEDED   HYSTEROSCOPY W/  NOVASURE ENDOMETRIAL ABLATION  2014   LAPAROSCOPIC OVARIAN CYST BX  2005   AND  URETEROSCOPIC LASER LITHO  STONE EXTRACTION   SHOULDER OPEN ROTATOR CUFF REPAIR Right 2004   TONSILLECTOMY     TRANSTHORACIC ECHOCARDIOGRAM  06-07-2006   normal study/  ef 60-65%   TUBAL LIGATION Bilateral 1995   Past Surgical History:  Procedure Laterality Date   ACHILLES TENDON SURGERY Right 3/16   ANTERIOR CERVICAL DECOMP/DISCECTOMY FUSION N/A 03/11/2016   Procedure: C5-6, C6-7 Anterior Cervical Discectomy and Fusion, Allograft, Plate;  Surgeon: Eldred Manges, MD;  Location: [facility] OR;  Service: Orthopedics;  Laterality: N/A;   CHOLECYSTECTOMY  2014   COLONOSCOPY WITH PROPOFOL N/A 08/25/2017   Procedure: COLONOSCOPY WITH PROPOFOL;  Surgeon: Scot Jun, MD;  Location: [facility] ENDOSCOPY;  Service: Endoscopy;  Laterality: N/A;   CYSTO WITH HYDRODISTENSION N/A 01/26/2014   Procedure: CYSTOSCOPY/HYDRODISTENSION;  Surgeon: [name], MD;  [facility];  Service: Urology;  Laterality: N/A;   CYSTO/  URETHRAL DILATION/  HYDRODISTENTION/   INSTILLATION THERAPY  07-16-2010//   12-30-2007//   10-27-2006   ELBOW SURGERY Right    EXERCISE TOLERENCE TEST  10-12-2010   NEGATIVE  ADEQUATE ETT/  NO ISCHEMIA OR EVIDENCE HIGH GRADE OBSTRUCTIVE CAD/  NO FURTHER TEST NEEDED   HYSTEROSCOPY W/  NOVASURE ENDOMETRIAL ABLATION  2014   LAPAROSCOPIC OVARIAN CYST BX  2005   AND  URETEROSCOPIC LASER LITHO  STONE EXTRACTION   SHOULDER OPEN ROTATOR CUFF REPAIR Right 2004   TONSILLECTOMY     TRANSTHORACIC ECHOCARDIOGRAM  06-07-2006   normal study/  ef 60-65%   TUBAL LIGATION Bilateral 1995   Past Medical History:  Diagnosis Date   Anxiety    Anxiety disorder    Arthritis    Blepharitis    Chronic low back pain    Chronic pain    Depression    Diabetes mellitus without complication (HCC)    Dyslipidemia    Family history of blood clots    Fibromyalgia    GERD (gastroesophageal reflux disease)    Headache    hx migraines   History of kidney stones    History of panic attacks    History of renal calculi    IBS (irritable bowel syndrome)    Interstitial cystitis    Lupus    tested positive for antibodies for lupus. 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   BP (!) 125/58   Pulse 78   Ht 5\' 7"  (1.702 m)   Wt 225 lb (102.1 kg)   SpO2 94%   BMI 35.24 kg/m   Opioid Risk  Score:   Fall Risk Score:  `1  Depression screen [facility] 2/9     07/11/2023    9:40 AM 07/04/2023    9:10 AM 06/26/2023   11:13 AM 05/14/2023    9:07 AM 02/24/2023    8:38 AM 01/24/2023    9:02 AM 12/27/2022    8:42 AM  Depression screen PHQ 2/9  Decreased Interest 0 0 0 0 0 0 0  Down, Depressed, Hopeless 0 0  0 0 0 0 0  PHQ - 2 Score 0 0 0 0 0 0 0      Review of Systems  Musculoskeletal:  Positive for back pain.  All other systems reviewed and are negative.     Objective:   Physical Exam        Assessment & Plan:  1. Lumbago/ Lumbar Spondylosis/ Right  Lumbar Radiculitis: Continue to Monitor. 12/31/ 2024 Continue : HYDROcodone 10/325mg  one tablet every 8 hours as needed #90.  We will continue the opioid monitoring program, this consists of regular clinic visits, examinations, urine drug screen, pill counts as well as use of [place] Controlled Substance Reporting system. A 12 month History has been reviewed on the [place] Controlled Substance Reporting System on 09/23/2023. 2. Fibromyalgia. Continue Current exercise Regime. 09/23/2023 3. Anxiety and depression:Psychiatry Following: Counselor Jessica  at [facility] . Continue Counseling at [facility]. 09/23/2023 4. Migraines/ Frontal Headache: On Maxalt. Keep a headache Journal. Schedule an appointment with Dr Carlis Abbott to discuss Migraine treatment, she verbalizes understanding. Neurology Following. 09/23/2023 5. OSA : PCP Following. Continue to Monitor. 09/23/2023 6. Obesity: Dr Carlis Abbott Following: Continue  Healthy Diet Regimen and Continue HEP as Tolerated. 09/23/2023 7. Status Post Cervical Spinal Fusion: C5C6- C-6- C-7 Anterior Cervical Discectomy Fusion and Allograft Plate: Dr. Ophelia Charter Following. 09/23/2023 8. Cervicalgia/ Cervical Radiculitis/ S/P Cervical Spinal Fusion:  Continue to Monitor Dr. Ophelia Charter Following. 09/23/2023 9. Muscle Spasm: Continue Tizanidine as needed.09/23/2023 10.Hereditary and  Idiopathic Peripheral Neuropathy Continue with Tens Unit. 09/23/2023. 11.Left Greater Trochanteric Bursitis: . Continue to Alternate Ice and heat Therapy. Continue to Monitor. 09/23/2023. 12. Chronic Bilateral Knee Pain: No Complaints Today. Ortho Following. Continue HEP as Tolerated. Continue to Monitor. 09/23/2023 13. Lateral Epicondylitis of Right Elbow: No complaints today.  Ortho Following. S/P   06/26/2020 Right lateral epicondyle debridement, drilling and repair  By Dr Ophelia Charter. 14. Neuralgia/ Right Facial Droop: Neurology following. Continue to monitor. 09/23/2023. 15. Polyarthralgia: No complaints today. Continue HEP as tolerated. Continue to monitor. 09/23/2023  16. Paresthesia of Right Lower Extremity: No complaints today. Occasionally: Continue current medication regimen. Continue to monitor. 09/23/2023 17 . Right Foot Pain/ Chronic Right Ankle Pain:  Podiatry following. Dr Regal.We will continue to Monitor. 09/23/2023 .    F/U in 1 month .

## 2023-10-20 ENCOUNTER — Other Ambulatory Visit: Payer: Self-pay

## 2023-11-10 ENCOUNTER — Encounter: Payer: Self-pay | Admitting: Registered Nurse

## 2023-11-10 ENCOUNTER — Encounter: Payer: Medicare Other | Attending: Physical Medicine & Rehabilitation | Admitting: Registered Nurse

## 2023-11-10 ENCOUNTER — Other Ambulatory Visit: Payer: Self-pay

## 2023-11-10 VITALS — BP 139/73 | HR 79 | Ht 67.0 in | Wt 227.0 lb

## 2023-11-10 DIAGNOSIS — M542 Cervicalgia: Secondary | ICD-10-CM | POA: Diagnosis not present

## 2023-11-10 DIAGNOSIS — M5416 Radiculopathy, lumbar region: Secondary | ICD-10-CM | POA: Diagnosis not present

## 2023-11-10 DIAGNOSIS — M79671 Pain in right foot: Secondary | ICD-10-CM | POA: Diagnosis not present

## 2023-11-10 DIAGNOSIS — Z5181 Encounter for therapeutic drug level monitoring: Secondary | ICD-10-CM | POA: Insufficient documentation

## 2023-11-10 DIAGNOSIS — M25511 Pain in right shoulder: Secondary | ICD-10-CM | POA: Diagnosis not present

## 2023-11-10 DIAGNOSIS — Z79899 Other long term (current) drug therapy: Secondary | ICD-10-CM | POA: Insufficient documentation

## 2023-11-10 DIAGNOSIS — M25562 Pain in left knee: Secondary | ICD-10-CM | POA: Diagnosis not present

## 2023-11-10 DIAGNOSIS — G8929 Other chronic pain: Secondary | ICD-10-CM | POA: Insufficient documentation

## 2023-11-10 DIAGNOSIS — M797 Fibromyalgia: Secondary | ICD-10-CM | POA: Diagnosis not present

## 2023-11-10 DIAGNOSIS — G894 Chronic pain syndrome: Secondary | ICD-10-CM | POA: Insufficient documentation

## 2023-11-10 DIAGNOSIS — M7062 Trochanteric bursitis, left hip: Secondary | ICD-10-CM | POA: Diagnosis not present

## 2023-11-10 MED ORDER — HYDROCODONE-ACETAMINOPHEN 10-325 MG PO TABS
1.0000 | ORAL_TABLET | Freq: Three times a day (TID) | ORAL | 0 refills | Status: DC | PRN
  Filled 2023-11-10 – 2023-11-18 (×2): qty 90, 30d supply, fill #0

## 2023-11-10 NOTE — Progress Notes (Signed)
Subjective:    Patient ID: Karina Greer, female    DOB: [birth year], 58 y.o.   MRN: [id number]  HPI: Karina Greer is a 58 y.o. female who returns for follow up appointment for chronic pain and medication refill. She states her pain is located in her neck, right shoulder, lower back radiating into her left hip and left lower extremity. She also reports left knee pain and right foot pain. She rates her pain 6. Her current exercise regime is walking and performing stretching exercises.  Karina Greer Morphine equivalent is 30.00 MME.   Lasst Oral Swab was Performed on 09/23/2023, it was consistent.      Pain Inventory Average Pain 10 Pain Right Now 6 My pain is intermittent, constant, sharp, burning, dull, stabbing, tingling, and aching  In the last 24 hours, has pain interfered with the following? General activity 8 Relation with others 9 Enjoyment of life 6 What TIME of day is your pain at its worst? night and varies Sleep (in general) Poor  Pain is worse with: walking, bending, sitting, inactivity, standing, and some activites Pain improves with: rest, heat/ice, therapy/exercise, medication, and TENS Relief from Meds: 7  Family History  Problem Relation Age of Onset   Hypertension Mother    Diabetes Father    Brain cancer Father    Clotting disorder Father        blood clots   Fibromyalgia Sister    Suicidality Sister    Hypertension Other        Cancer, Cerebrovascular disease run on mother side of family   Social History   Socioeconomic History   Marital status: Divorced    Spouse name: Not on file   Number of children: 3   Years of education: HS   Highest education level: 12th grade  Occupational History   Occupation: Designer, industrial/product: UNEMPLOYED    Comment: Disability  Tobacco Use   Smoking status: Never   Smokeless tobacco: Never  Vaping Use   Vaping status: Never Used  Substance and Sexual Activity   Alcohol use: No   Drug use: No   Sexual  activity: Not on file  Other Topics Concern   Not on file  Social History Narrative   Patient is right-handed. She avoids caffeine. She has recently been using the treadmill.   Social Drivers of Corporate investment banker Strain: Not on file  Food Insecurity: Not on file  Transportation Needs: Not on file  Physical Activity: Not on file  Stress: Not on file  Social Connections: Not on file   Past Surgical History:  Procedure Laterality Date   ACHILLES TENDON SURGERY Right 3/16   ANTERIOR CERVICAL DECOMP/DISCECTOMY FUSION N/A 03/11/2016   Procedure: C5-6, C6-7 Anterior Cervical Discectomy and Fusion, Allograft, Plate;  Surgeon: Eldred Manges, MD;  Location: [facility];  Service: Orthopedics;  Laterality: N/A;   CHOLECYSTECTOMY  2014   COLONOSCOPY WITH PROPOFOL N/A 08/25/2017   Procedure: COLONOSCOPY WITH PROPOFOL;  Surgeon: Scot Jun, MD;  Location: [facility] ENDOSCOPY;  Service: Endoscopy;  Laterality: N/A;   CYSTO WITH HYDRODISTENSION N/A 01/26/2014   Procedure: CYSTOSCOPY/HYDRODISTENSION;  Surgeon: [name], MD;  Location: [facility];  Service: Urology;  Laterality: N/A;   CYSTO/  URETHRAL DILATION/  HYDRODISTENTION/   INSTILLATION THERAPY  07-16-2010//   12-30-2007//   10-27-2006   ELBOW SURGERY Right    EXERCISE TOLERENCE TEST  10-12-2010   NEGATIVE  ADEQUATE ETT/  NO ISCHEMIA OR EVIDENCE HIGH GRADE OBSTRUCTIVE CAD/  NO FURTHER TEST NEEDED   HYSTEROSCOPY W/  NOVASURE ENDOMETRIAL ABLATION  2014   LAPAROSCOPIC OVARIAN CYST BX  2005   AND  URETEROSCOPIC LASER LITHO  STONE EXTRACTION   SHOULDER OPEN ROTATOR CUFF REPAIR Right 2004   TONSILLECTOMY     TRANSTHORACIC ECHOCARDIOGRAM  06-07-2006   normal study/  ef 60-65%   TUBAL LIGATION Bilateral 1995   Past Surgical History:  Procedure Laterality Date   ACHILLES TENDON SURGERY Right 3/16   ANTERIOR CERVICAL DECOMP/DISCECTOMY FUSION N/A 03/11/2016   Procedure: C5-6, C6-7 Anterior Cervical Discectomy and Fusion,  Allograft, Plate;  Surgeon: Eldred Manges, MD;  Location: [facility] OR;  Service: Orthopedics;  Laterality: N/A;   CHOLECYSTECTOMY  2014   COLONOSCOPY WITH PROPOFOL N/A 08/25/2017   Procedure: COLONOSCOPY WITH PROPOFOL;  Surgeon: Scot Jun, MD;  Location: [facility] ENDOSCOPY;  Service: Endoscopy;  Laterality: N/A;   CYSTO WITH HYDRODISTENSION N/A 01/26/2014   Procedure: CYSTOSCOPY/HYDRODISTENSION;  Surgeon: [name], MD;  Location: [facility];  Service: Urology;  Laterality: N/A;   CYSTO/  URETHRAL DILATION/  HYDRODISTENTION/   INSTILLATION THERAPY  07-16-2010//   12-30-2007//   10-27-2006   ELBOW SURGERY Right    EXERCISE TOLERENCE TEST  10-12-2010   NEGATIVE  ADEQUATE ETT/  NO ISCHEMIA OR EVIDENCE HIGH GRADE OBSTRUCTIVE CAD/  NO FURTHER TEST NEEDED   HYSTEROSCOPY W/  NOVASURE ENDOMETRIAL ABLATION  2014   LAPAROSCOPIC OVARIAN CYST BX  2005   AND  URETEROSCOPIC LASER LITHO  STONE EXTRACTION   SHOULDER OPEN ROTATOR CUFF REPAIR Right 2004   TONSILLECTOMY     TRANSTHORACIC ECHOCARDIOGRAM  06-07-2006   normal study/  ef 60-65%   TUBAL LIGATION Bilateral 1995   Past Medical History:  Diagnosis Date   Anxiety    Anxiety disorder    Arthritis    Blepharitis    Chronic low back pain    Chronic pain    Depression    Diabetes mellitus without complication (HCC)    Dyslipidemia    Family history of blood clots    Fibromyalgia    GERD (gastroesophageal reflux disease)    Headache    hx migraines   History of kidney stones    History of panic attacks    History of renal calculi    IBS (irritable bowel syndrome)    Interstitial cystitis    Lupus    tested positive for antibodies for lupus. dr to do further tests   Menorrhagia    OSA on CPAP    not used cpap for several weeks   Pneumonia    hx   PONV (postoperative nausea and vomiting)    RLS (restless legs syndrome)    Rosacea    Seasonal asthma    SI (sacroiliac) joint dysfunction    SUI (stress urinary  incontinence, female)    UTI (lower urinary tract infection)    hx   White matter abnormality on MRI of brain 02/23/2013   BP 139/73   Pulse 79   Ht 5\' 7"  (1.702 m)   Wt 227 lb (103 kg)   SpO2 95%   BMI 35.55 kg/m   Opioid Risk Score:   Fall Risk Score:  `1  Depression screen [facility] 2/9     07/11/2023    9:40 AM 07/04/2023    9:10 AM 06/26/2023   11:13 AM 05/14/2023    9:07 AM 02/24/2023    8:38  AM 01/24/2023    9:02 AM 12/27/2022    8:42 AM  Depression screen PHQ 2/9  Decreased Interest 0 0 0 0 0 0 0  Down, Depressed, Hopeless 0 0 0 0 0 0 0  PHQ - 2 Score 0 0 0 0 0 0 0    Review of Systems  Musculoskeletal:  Positive for back pain and neck pain.       Right shoulder pain, left knee pain Pain all over  All other systems reviewed and are negative.      Objective:   Physical Exam Vitals and nursing note reviewed.  Constitutional:      Appearance: Normal appearance. She is obese.  Neck:     Comments: Cervical Paraspinal Tenderness: C-5-C-6  Cardiovascular:     Rate and Rhythm: Normal rate and regular rhythm.     Pulses: Normal pulses.     Heart sounds: Normal heart sounds.  Pulmonary:     Effort: Pulmonary effort is normal.     Breath sounds: Normal breath sounds.  Musculoskeletal:     Comments: Normal Muscle Bulk and Muscle Testing Reveals:  Upper Extremities: Full ROM and Muscle Strength 5/5 Left AC Joint Tenderness  Thoracic Paraspinal Tenderness: T-10-T-12  Lumbar Paraspinal Tenderness: L-3-L-5 Left Greater Trochanter Tenderness Lower Extremities : Full ROM and Muscle Strength 5/5 Arises from Chair slowly Narrow Based Gait     Skin:    General: Skin is warm and dry.  Neurological:     Mental Status: She is alert and oriented to person, place, and time.  Psychiatric:        Mood and Affect: Mood normal.        Behavior: Behavior normal.         Assessment & Plan:  1. Lumbago/ Lumbar Spondylosis/ Right  Lumbar Radiculitis: Continue to Monitor.  02/17/ 2025 Continue : HYDROcodone 10/325mg  one tablet every 8 hours as needed #90.  We will continue the opioid monitoring program, this consists of regular clinic visits, examinations, urine drug screen, pill counts as well as use of [place] Controlled Substance Reporting system. A 12 month History has been reviewed on the [place] Controlled Substance Reporting System on 11/10/2023. 2. Fibromyalgia. Continue Current exercise Regime. 11/10/2023 3. Anxiety and depression:Psychiatry Following: [facility]or [facility]  at [facility] . Continue Counseling at [facility]. 11/10/2023 4. Migraines/ Frontal Headache: On Maxalt. Keep a headache Journal. Schedule an appointment with Dr Carlis Abbott to discuss Migraine treatment, she verbalizes understanding. Neurology Following. 11/10/2023 5. OSA : PCP Following. Continue to Monitor. 11/10/2023 6. Obesity: Dr Carlis Abbott Following: Continue  Healthy Diet Regimen and Continue HEP as Tolerated. 11/10/2023 7. Status Post Cervical Spinal Fusion: C5C6- C-6- C-7 Anterior Cervical Discectomy Fusion and Allograft Plate: Dr. Ophelia Charter Following. 11/10/2023 8. Cervicalgia/ Cervical Radiculitis/ S/P Cervical Spinal Fusion:  Continue to Monitor Dr. Ophelia Charter Following. 11/10/2023 9. Muscle Spasm: Continue Tizanidine as needed.11/10/2023 10.Hereditary and Idiopathic Peripheral Neuropathy Continue with Tens Unit. 11/10/2023. 11.Left Greater Trochanteric Bursitis: . Continue to Alternate Ice and heat Therapy. Continue to Monitor. 11/10/2023. 12. Chronic Left  Knee Pain:  Ortho Following. Continue HEP as Tolerated. Continue to Monitor. 11/10/2023 13. Lateral Epicondylitis of Right Elbow: No complaints today.  Ortho Following. S/P   06/26/2020 Right lateral epicondyle debridement, drilling and repair  By Dr Ophelia Charter. 14. Neuralgia/ Right Facial Droop: Neurology following. Continue to monitor. 11/10/2023. 15. Polyarthralgia: No complaints today. Continue HEP as tolerated.  Continue to monitor. 11/10/2023  16. Paresthesia of Right Lower  Extremity: No complaints today. Occasionally: Continue current medication regimen. Continue to monitor. 11/10/2023 17 . Right Foot Pain/ Chronic Right Ankle Pain:  Podiatry following. Dr Regal.We will continue to Monitor. 11/10/2023 .    F/U in 1 month .

## 2023-11-13 ENCOUNTER — Encounter: Payer: Medicare Other | Admitting: Registered Nurse

## 2023-11-18 ENCOUNTER — Other Ambulatory Visit: Payer: Self-pay

## 2023-11-25 ENCOUNTER — Encounter: Payer: Medicare Other | Admitting: Physical Medicine and Rehabilitation

## 2023-12-08 DIAGNOSIS — N3010 Interstitial cystitis (chronic) without hematuria: Secondary | ICD-10-CM | POA: Diagnosis not present

## 2023-12-08 DIAGNOSIS — E7800 Pure hypercholesterolemia, unspecified: Secondary | ICD-10-CM | POA: Diagnosis not present

## 2023-12-08 DIAGNOSIS — M810 Age-related osteoporosis without current pathological fracture: Secondary | ICD-10-CM | POA: Diagnosis not present

## 2023-12-08 DIAGNOSIS — K219 Gastro-esophageal reflux disease without esophagitis: Secondary | ICD-10-CM | POA: Diagnosis not present

## 2023-12-08 DIAGNOSIS — Z1211 Encounter for screening for malignant neoplasm of colon: Secondary | ICD-10-CM | POA: Diagnosis not present

## 2023-12-08 DIAGNOSIS — L219 Seborrheic dermatitis, unspecified: Secondary | ICD-10-CM | POA: Diagnosis not present

## 2023-12-08 DIAGNOSIS — E119 Type 2 diabetes mellitus without complications: Secondary | ICD-10-CM | POA: Diagnosis not present

## 2023-12-08 DIAGNOSIS — G43009 Migraine without aura, not intractable, without status migrainosus: Secondary | ICD-10-CM | POA: Diagnosis not present

## 2023-12-08 DIAGNOSIS — E559 Vitamin D deficiency, unspecified: Secondary | ICD-10-CM | POA: Diagnosis not present

## 2023-12-10 NOTE — Progress Notes (Signed)
 Subjective:    Patient ID: Karina Greer, female    DOB: 1966/09/02, 58 y.o.   MRN: [id number]  HPI: Karina Greer is a 58 y.o. female who returns for follow up appointment for chronic pain and medication refill. She states her pain is located in her neck radiating into her left shoulder, right shoulder pain, lower back, left hip, and left knee pain. Also reports bilateral feet pai R>L. [name] She rates her pain 7. Her current exercise regime is walking  her dog, about 1 mile a day and performing stretching exercises.  [name] Morphine equivalent is 30.00 MME.   Last Oral Swab was Performed on 09/22/2024   Pain Inventory Average Pain 7 Pain Right Now 7 My pain is intermittent, constant, sharp, burning, dull, stabbing, tingling, and aching  In the last 24 hours, has pain interfered with the following? General activity 6 Relation with others 6 Enjoyment of life 4 What TIME of day is your pain at its worst? varies Sleep (in general) Poor  Pain is worse with: walking, bending, sitting, inactivity, and standing Pain improves with: medication Relief from Meds: 6  Family History  Problem Relation Age of Onset   Hypertension Mother    Diabetes Father    Brain cancer Father    Clotting disorder Father        blood clots   Fibromyalgia Sister    Suicidality Sister    Hypertension Other        Cancer, Cerebrovascular disease run on mother side of family   Social History   Socioeconomic History   Marital status: Divorced    Spouse name: Not on file   Number of children: 3   Years of education: HS   Highest education level: 12th grade  Occupational History   Occupation: Designer, industrial/product: UNEMPLOYED    Comment: Disability  Tobacco Use   Smoking status: Never   Smokeless tobacco: Never  Vaping Use   Vaping status: Never Used  Substance and Sexual Activity   Alcohol use: No   Drug use: No   Sexual activity: Not on file  Other Topics Concern   Not on file  Social  History Narrative   Patient is right-handed. She avoids caffeine. She has recently been using the treadmill.   Social Drivers of Corporate investment banker Strain: Not on file  Food Insecurity: Not on file  Transportation Needs: Not on file  Physical Activity: Not on file  Stress: Not on file  Social Connections: Not on file   Past Surgical History:  Procedure Laterality Date   ACHILLES TENDON SURGERY Right 3/16   ANTERIOR CERVICAL DECOMP/DISCECTOMY FUSION N/A 03/11/2016   Procedure: C5-6, C6-7 Anterior Cervical Discectomy and Fusion, Allograft, Plate;  Surgeon: Eldred Manges, MD;  Location: [facility] OR;  Service: Orthopedics;  Laterality: N/A;   CHOLECYSTECTOMY  2014   COLONOSCOPY WITH PROPOFOL N/A 08/25/2017   Procedure: COLONOSCOPY WITH PROPOFOL;  Surgeon: Scot Jun, MD;  Location: [facility] ENDOSCOPY;  Service: Endoscopy;  Laterality: N/A;   CYSTO WITH HYDRODISTENSION N/A 01/26/2014   Procedure: CYSTOSCOPY/HYDRODISTENSION;  Surgeon: [name], MD;  Location: [facility];  Service: Urology;  Laterality: N/A;   CYSTO/  URETHRAL DILATION/  HYDRODISTENTION/   INSTILLATION THERAPY  07-16-2010//   12-30-2007//   10-27-2006   ELBOW SURGERY Right    EXERCISE TOLERENCE TEST  10-12-2010   NEGATIVE  ADEQUATE ETT/  NO ISCHEMIA OR EVIDENCE HIGH GRADE OBSTRUCTIVE  CAD/  NO FURTHER TEST NEEDED   HYSTEROSCOPY W/  NOVASURE ENDOMETRIAL ABLATION  2014   LAPAROSCOPIC OVARIAN CYST BX  2005   AND  URETEROSCOPIC LASER LITHO  STONE EXTRACTION   SHOULDER OPEN ROTATOR CUFF REPAIR Right 2004   TONSILLECTOMY     TRANSTHORACIC ECHOCARDIOGRAM  06-07-2006   normal study/  ef 60-65%   TUBAL LIGATION Bilateral 1995   Past Surgical History:  Procedure Laterality Date   ACHILLES TENDON SURGERY Right 3/16   ANTERIOR CERVICAL DECOMP/DISCECTOMY FUSION N/A 03/11/2016   Procedure: C5-6, C6-7 Anterior Cervical Discectomy and Fusion, Allograft, Plate;  Surgeon: Eldred Manges, MD;  Location: [facility];   Service: Orthopedics;  Laterality: N/A;   CHOLECYSTECTOMY  2014   COLONOSCOPY WITH PROPOFOL N/A 08/25/2017   Procedure: COLONOSCOPY WITH PROPOFOL;  Surgeon: Scot Jun, MD;  Location: [facility] ENDOSCOPY;  Service: Endoscopy;  Laterality: N/A;   CYSTO WITH HYDRODISTENSION N/A 01/26/2014   Procedure: CYSTOSCOPY/HYDRODISTENSION;  Surgeon: [name], MD;  Location: [facility];  Service: Urology;  Laterality: N/A;   CYSTO/  URETHRAL DILATION/  HYDRODISTENTION/   INSTILLATION THERAPY  07-16-2010//   12-30-2007//   10-27-2006   ELBOW SURGERY Right    EXERCISE TOLERENCE TEST  10-12-2010   NEGATIVE  ADEQUATE ETT/  NO ISCHEMIA OR EVIDENCE HIGH GRADE OBSTRUCTIVE CAD/  NO FURTHER TEST NEEDED   HYSTEROSCOPY W/  NOVASURE ENDOMETRIAL ABLATION  2014   LAPAROSCOPIC OVARIAN CYST BX  2005   AND  URETEROSCOPIC LASER LITHO  STONE EXTRACTION   SHOULDER OPEN ROTATOR CUFF REPAIR Right 2004   TONSILLECTOMY     TRANSTHORACIC ECHOCARDIOGRAM  06-07-2006   normal study/  ef 60-65%   TUBAL LIGATION Bilateral 1995   Past Medical History:  Diagnosis Date   Anxiety    Anxiety disorder    Arthritis    Blepharitis    Chronic low back pain    Chronic pain    Depression    Diabetes mellitus without complication (HCC)    Dyslipidemia    Family history of blood clots    Fibromyalgia    GERD (gastroesophageal reflux disease)    Headache    hx migraines   History of kidney stones    History of panic attacks    History of renal calculi    IBS (irritable bowel syndrome)    Interstitial cystitis    Lupus    tested positive for antibodies for lupus. dr to do further tests   Menorrhagia    OSA on CPAP    not used cpap for several weeks   Pneumonia    hx   PONV (postoperative nausea and vomiting)    RLS (restless legs syndrome)    Rosacea    Seasonal asthma    SI (sacroiliac) joint dysfunction    SUI (stress urinary incontinence, female)    UTI (lower urinary tract infection)    hx    White matter abnormality on MRI of brain 02/23/2013   Resp 16   Ht 5\' 3"  (1.6 m)   Wt 220 lb (99.8 kg)   BMI 38.97 kg/m   Opioid Risk Score:   Fall Risk Score:  `1  Depression screen [facility] 2/9     07/11/2023    9:40 AM 07/04/2023    9:10 AM 06/26/2023   11:13 AM 05/14/2023    9:07 AM 02/24/2023    8:38 AM 01/24/2023    9:02 AM 12/27/2022    8:42 AM  Depression  screen PHQ 2/9  Decreased Interest 0 0 0 0 0 0 0  Down, Depressed, Hopeless 0 0 0 0 0 0 0  PHQ - 2 Score 0 0 0 0 0 0 0    Review of Systems  Musculoskeletal:  Positive for back pain and neck pain.       Right shoulder Left knee B/l feet       Objective:   Physical Exam Vitals and nursing note reviewed.  Constitutional:      Appearance: Normal appearance. She is obese.  Neck:     Comments: Cervical Paraspinal Tenderness: C-5-C-6 Cardiovascular:     Rate and Rhythm: Normal rate and regular rhythm.     Pulses: Normal pulses.     Heart sounds: Normal heart sounds.  Pulmonary:     Effort: Pulmonary effort is normal.     Breath sounds: Normal breath sounds.  Musculoskeletal:     Comments: Normal Muscle Bulk and Muscle Testing Reveals:  Upper Extremities: Decreased ROM 90 Degrees and Muscle Strength  5/5 Thoracic Paraspinal Tenderness T-1- T-2 T-4-T-6 Left Greater Trochanter tenderness Lower Extremities: Full ROM and Muscle Strength 5/5 Arises from chair slowly Narrow based  Gait     Skin:    General: Skin is warm and dry.  Neurological:     Mental Status: She is alert and oriented to person, place, and time.  Psychiatric:        Mood and Affect: Mood normal.        Behavior: Behavior normal.         Assessment & Plan:  1. Lumbago/ Lumbar Spondylosis/ Right  Lumbar Radiculitis: Continue to Monitor. 03/20/ 2025 Continue : HYDROcodone 10/325mg  one tablet every 8 hours as needed #90.  We will continue the opioid monitoring program, this consists of regular clinic visits, examinations, urine drug screen, pill  counts as well as use of [place] Controlled Substance Reporting system. A 12 month History has been reviewed on the [place] Controlled Substance Reporting System on 12/11/2023. 2. Fibromyalgia. Continue Current exercise Regime. 12/11/2023 3. Anxiety and depression:Psychiatry Following: Counselor Jessica  at [facility] . Continue Counseling at [facility]. 12/11/2023 4. Migraines/ Frontal Headache: On Maxalt. Keep a headache Journal. . Neurology Following. 12/11/2023 5. OSA : PCP Following. Continue to Monitor. 12/11/2023 6. Obesity: Dr Carlis Abbott Following: Continue  Healthy Diet Regimen and Continue HEP as Tolerated. 12/11/2023 7. Status Post Cervical Spinal Fusion: C5C6- C-6- C-7 Anterior Cervical Discectomy Fusion and Allograft Plate: Dr. Ophelia Charter Following. 12/11/2023 8. Cervicalgia/ Cervical Radiculitis/ S/P Cervical Spinal Fusion:  Continue to Monitor Dr. Ophelia Charter Following. 12/11/2023 9. Muscle Spasm: Continue Tizanidine as needed.12/11/2023 10.Hereditary and Idiopathic Peripheral Neuropathy Continue with Tens Unit. 12/11/2023. 11.Left Greater Trochanteric Bursitis: . Continue to Alternate Ice and heat Therapy. Continue to Monitor. 12/11/2023. 12. Chronic Left  Knee Pain:  Ortho Following. Continue HEP as Tolerated. Continue to Monitor. 12/11/2023 13. Lateral Epicondylitis of Right Elbow: No complaints today.  Ortho Following. S/P   06/26/2020 Right lateral epicondyle debridement, drilling and repair  By Dr Ophelia Charter. 14. Neuralgia/ Right Facial Droop: Neurology following. Continue to monitor. 12/11/2023. 15. Polyarthralgia: No complaints today. Continue HEP as tolerated. Continue to monitor. 12/11/2023  16. Paresthesia of Right Lower Extremity: No complaints today. Occasionally: Continue current medication regimen. Continue to monitor. 12/11/2023 17 . Bilateral Foot Pain/ Chronic Right Ankle Pain:  Podiatry following. Dr Regal.We will continue to Monitor. 12/11/2023 .    F/U  in 1 month .

## 2023-12-11 ENCOUNTER — Other Ambulatory Visit: Payer: Self-pay

## 2023-12-11 ENCOUNTER — Encounter: Payer: Medicare Other | Attending: Physical Medicine & Rehabilitation | Admitting: Registered Nurse

## 2023-12-11 VITALS — BP 131/84 | HR 62 | Resp 16 | Ht 63.0 in | Wt 220.0 lb

## 2023-12-11 DIAGNOSIS — G8929 Other chronic pain: Secondary | ICD-10-CM | POA: Diagnosis not present

## 2023-12-11 DIAGNOSIS — M7062 Trochanteric bursitis, left hip: Secondary | ICD-10-CM

## 2023-12-11 DIAGNOSIS — M542 Cervicalgia: Secondary | ICD-10-CM | POA: Diagnosis not present

## 2023-12-11 DIAGNOSIS — Z79899 Other long term (current) drug therapy: Secondary | ICD-10-CM

## 2023-12-11 DIAGNOSIS — G894 Chronic pain syndrome: Secondary | ICD-10-CM | POA: Diagnosis not present

## 2023-12-11 DIAGNOSIS — M545 Low back pain, unspecified: Secondary | ICD-10-CM | POA: Insufficient documentation

## 2023-12-11 DIAGNOSIS — M797 Fibromyalgia: Secondary | ICD-10-CM

## 2023-12-11 DIAGNOSIS — Z5181 Encounter for therapeutic drug level monitoring: Secondary | ICD-10-CM

## 2023-12-11 DIAGNOSIS — M5412 Radiculopathy, cervical region: Secondary | ICD-10-CM

## 2023-12-11 MED ORDER — HYDROCODONE-ACETAMINOPHEN 10-325 MG PO TABS
1.0000 | ORAL_TABLET | Freq: Three times a day (TID) | ORAL | 0 refills | Status: DC | PRN
  Filled 2023-12-11: qty 90, 30d supply, fill #0

## 2023-12-12 ENCOUNTER — Other Ambulatory Visit: Payer: Self-pay

## 2023-12-15 ENCOUNTER — Other Ambulatory Visit: Payer: Self-pay | Admitting: Nurse Practitioner

## 2023-12-18 ENCOUNTER — Other Ambulatory Visit: Payer: Self-pay | Admitting: Nurse Practitioner

## 2023-12-18 DIAGNOSIS — R1032 Left lower quadrant pain: Secondary | ICD-10-CM

## 2023-12-19 ENCOUNTER — Ambulatory Visit
Admission: RE | Admit: 2023-12-19 | Discharge: 2023-12-19 | Disposition: A | Discharge status: Home / Self Care | Source: Ambulatory Visit | Attending: Nurse Practitioner | Admitting: Nurse Practitioner

## 2023-12-19 DIAGNOSIS — K588 Other irritable bowel syndrome: Secondary | ICD-10-CM | POA: Diagnosis not present

## 2023-12-19 DIAGNOSIS — Z9049 Acquired absence of other specified parts of digestive tract: Secondary | ICD-10-CM | POA: Diagnosis not present

## 2023-12-19 DIAGNOSIS — R1032 Left lower quadrant pain: Secondary | ICD-10-CM | POA: Diagnosis not present

## 2023-12-19 DIAGNOSIS — R109 Unspecified abdominal pain: Secondary | ICD-10-CM | POA: Diagnosis not present

## 2023-12-19 DIAGNOSIS — K760 Fatty (change of) liver, not elsewhere classified: Secondary | ICD-10-CM | POA: Diagnosis not present

## 2023-12-29 ENCOUNTER — Ambulatory Visit: Admit: 2023-12-29 | Payer: Medicare Other | Admitting: Gastroenterology

## 2023-12-29 SURGERY — COLONOSCOPY WITH PROPOFOL
Anesthesia: General

## 2024-01-13 ENCOUNTER — Other Ambulatory Visit: Payer: Self-pay

## 2024-01-13 ENCOUNTER — Encounter: Attending: Physical Medicine & Rehabilitation | Admitting: Registered Nurse

## 2024-01-13 VITALS — BP 138/83 | HR 75 | Ht 63.0 in | Wt 225.0 lb

## 2024-01-13 DIAGNOSIS — M797 Fibromyalgia: Secondary | ICD-10-CM | POA: Insufficient documentation

## 2024-01-13 DIAGNOSIS — Z79891 Long term (current) use of opiate analgesic: Secondary | ICD-10-CM | POA: Diagnosis not present

## 2024-01-13 DIAGNOSIS — G894 Chronic pain syndrome: Secondary | ICD-10-CM | POA: Diagnosis not present

## 2024-01-13 DIAGNOSIS — M79672 Pain in left foot: Secondary | ICD-10-CM | POA: Diagnosis not present

## 2024-01-13 DIAGNOSIS — Z5181 Encounter for therapeutic drug level monitoring: Secondary | ICD-10-CM | POA: Insufficient documentation

## 2024-01-13 DIAGNOSIS — G8929 Other chronic pain: Secondary | ICD-10-CM | POA: Insufficient documentation

## 2024-01-13 DIAGNOSIS — M545 Low back pain, unspecified: Secondary | ICD-10-CM | POA: Diagnosis not present

## 2024-01-13 DIAGNOSIS — M79671 Pain in right foot: Secondary | ICD-10-CM | POA: Diagnosis not present

## 2024-01-13 DIAGNOSIS — Z79899 Other long term (current) drug therapy: Secondary | ICD-10-CM | POA: Diagnosis not present

## 2024-01-13 DIAGNOSIS — M62838 Other muscle spasm: Secondary | ICD-10-CM | POA: Diagnosis not present

## 2024-01-13 MED ORDER — HYDROCODONE-ACETAMINOPHEN 10-325 MG PO TABS
1.0000 | ORAL_TABLET | Freq: Three times a day (TID) | ORAL | 0 refills | Status: DC | PRN
  Filled 2024-01-13: qty 90, 30d supply, fill #0

## 2024-01-13 NOTE — Progress Notes (Signed)
 Subjective:    Patient ID: Karina Greer, female    DOB: [birth year], 58 y.o.   MRN: [id number]  HPI: Karina Greer is a 58 y.o. female who returns for follow up appointment for chronic pain and medication refill. She states her pain is located in her  lower back,  left lower extremity muscle spasm and bilateral feet. She rates her pain 6. Her current exercise regime is walking and performing stretching exercises.  Karina Greer Morphine equivalent is 30.00 MME.   Oral Swab ordered today.      Pain Inventory Average Pain 8 Pain Right Now 6 My pain is constant, sharp, burning, stabbing, tingling, and aching  In the last 24 hours, has pain interfered with the following? General activity 6 Relation with others 7 Enjoyment of life 8 What TIME of day is your pain at its worst? morning , daytime, evening, and night Sleep (in general) Poor  Pain is worse with: walking, bending, sitting, inactivity, standing, unsure, and some activites Pain improves with: rest, heat/ice, therapy/exercise, pacing activities, medication, TENS, and injections Relief from Meds: 8  Family History  Problem Relation Age of Onset   Hypertension Mother    Diabetes Father    Brain cancer Father    Clotting disorder Father        blood clots   Fibromyalgia Sister    Suicidality Sister    Hypertension Other        Cancer, Cerebrovascular disease run on mother side of family   Social History   Socioeconomic History   Marital status: Divorced    Spouse name: Not on file   Number of children: 3   Years of education: HS   Highest education level: 12th grade  Occupational History   Occupation: Designer, industrial/product: UNEMPLOYED    Comment: Disability  Tobacco Use   Smoking status: Never   Smokeless tobacco: Never  Vaping Use   Vaping status: Never Used  Substance and Sexual Activity   Alcohol  use: No   Drug use: No   Sexual activity: Not on file  Other Topics Concern   Not on file  Social History  Narrative   Patient is right-handed. She avoids caffeine. She has recently been using the treadmill.   Social Drivers of Corporate investment banker Strain: Not on file  Food Insecurity: Not on file  Transportation Needs: Not on file  Physical Activity: Not on file  Stress: Not on file  Social Connections: Not on file   Past Surgical History:  Procedure Laterality Date   ACHILLES TENDON SURGERY Right 3/16   ANTERIOR CERVICAL DECOMP/DISCECTOMY FUSION N/A 03/11/2016   Procedure: C5-6, C6-7 Anterior Cervical Discectomy and Fusion, Allograft, Plate;  Surgeon: Adah Acron, MD;  Location: [facility] OR;  Service: Orthopedics;  Laterality: N/A;   CHOLECYSTECTOMY  2014   COLONOSCOPY WITH PROPOFOL  N/A 08/25/2017   Procedure: COLONOSCOPY WITH PROPOFOL ;  Surgeon: Cassie Click, MD;  Location: [facility] ENDOSCOPY;  Service: Endoscopy;  Laterality: N/A;   CYSTO WITH HYDRODISTENSION N/A 01/26/2014   Procedure: CYSTOSCOPY/HYDRODISTENSION;  Surgeon: Livingston Rigg, MD;  Location: [facility];  Service: Urology;  Laterality: N/A;   CYSTO/  URETHRAL DILATION/  HYDRODISTENTION/   INSTILLATION THERAPY  07-16-2010//   12-30-2007//   10-27-2006   ELBOW SURGERY Right    EXERCISE TOLERENCE TEST  10-12-2010   NEGATIVE  ADEQUATE ETT/  NO ISCHEMIA OR EVIDENCE HIGH GRADE OBSTRUCTIVE CAD/  NO FURTHER TEST NEEDED  HYSTEROSCOPY W/  NOVASURE ENDOMETRIAL ABLATION  2014   LAPAROSCOPIC OVARIAN CYST BX  2005   AND  URETEROSCOPIC LASER LITHO  STONE EXTRACTION   SHOULDER OPEN ROTATOR CUFF REPAIR Right 2004   TONSILLECTOMY     TRANSTHORACIC ECHOCARDIOGRAM  06-07-2006   normal study/  ef 60-65%   TUBAL LIGATION Bilateral 1995   Past Surgical History:  Procedure Laterality Date   ACHILLES TENDON SURGERY Right 3/16   ANTERIOR CERVICAL DECOMP/DISCECTOMY FUSION N/A 03/11/2016   Procedure: C5-6, C6-7 Anterior Cervical Discectomy and Fusion, Allograft, Plate;  Surgeon: Adah Acron, MD;  Location: [facility];  Service:  Orthopedics;  Laterality: N/A;   CHOLECYSTECTOMY  2014   COLONOSCOPY WITH PROPOFOL  N/A 08/25/2017   Procedure: COLONOSCOPY WITH PROPOFOL ;  Surgeon: Cassie Click, MD;  Location: [facility] ENDOSCOPY;  Service: Endoscopy;  Laterality: N/A;   CYSTO WITH HYDRODISTENSION N/A 01/26/2014   Procedure: CYSTOSCOPY/HYDRODISTENSION;  Surgeon: Livingston Rigg, MD;  Location: [facility];  Service: Urology;  Laterality: N/A;   CYSTO/  URETHRAL DILATION/  HYDRODISTENTION/   INSTILLATION THERAPY  07-16-2010//   12-30-2007//   10-27-2006   ELBOW SURGERY Right    EXERCISE TOLERENCE TEST  10-12-2010   NEGATIVE  ADEQUATE ETT/  NO ISCHEMIA OR EVIDENCE HIGH GRADE OBSTRUCTIVE CAD/  NO FURTHER TEST NEEDED   HYSTEROSCOPY W/  NOVASURE ENDOMETRIAL ABLATION  2014   LAPAROSCOPIC OVARIAN CYST BX  2005   AND  URETEROSCOPIC LASER LITHO  STONE EXTRACTION   SHOULDER OPEN ROTATOR CUFF REPAIR Right 2004   TONSILLECTOMY     TRANSTHORACIC ECHOCARDIOGRAM  06-07-2006   normal study/  ef 60-65%   TUBAL LIGATION Bilateral 1995   Past Medical History:  Diagnosis Date   Anxiety    Anxiety disorder    Arthritis    Blepharitis    Chronic low back pain    Chronic pain    Depression    Diabetes mellitus without complication (HCC)    Dyslipidemia    Family history of blood clots    Fibromyalgia    GERD (gastroesophageal reflux disease)    Headache    hx migraines   History of kidney stones    History of panic attacks    History of renal calculi    IBS (irritable bowel syndrome)    Interstitial cystitis    Lupus    tested positive for antibodies for lupus. dr to do further tests   Menorrhagia    OSA on CPAP    not used cpap for several weeks   Pneumonia    hx   PONV (postoperative nausea and vomiting)    RLS (restless legs syndrome)    Rosacea    Seasonal asthma    SI (sacroiliac) joint dysfunction    SUI (stress urinary incontinence, female)    UTI (lower urinary tract infection)    hx   White matter  abnormality on MRI of brain 02/23/2013   BP 138/83   Pulse 75   Ht 5\' 3"  (1.6 m)   Wt 225 lb (102.1 kg)   SpO2 98%   BMI 39.86 kg/m   Opioid Risk Score:   Fall Risk Score:  `1  Depression screen [facility] 2/9     12/11/2023   10:02 AM 07/11/2023    9:40 AM 07/04/2023    9:10 AM 06/26/2023   11:13 AM 05/14/2023    9:07 AM 02/24/2023    8:38 AM 01/24/2023    9:02 AM  Depression screen  PHQ 2/9  Decreased Interest 0 0 0 0 0 0 0  Down, Depressed, Hopeless 0 0 0 0 0 0 0  PHQ - 2 Score 0 0 0 0 0 0 0    Review of Systems  Musculoskeletal:  Positive for back pain and myalgias.  All other systems reviewed and are negative.     Objective:   Physical Exam Vitals and nursing note reviewed.  Constitutional:      Appearance: Normal appearance. She is obese.  Cardiovascular:     Rate and Rhythm: Normal rate and regular rhythm.     Pulses: Normal pulses.     Heart sounds: Normal heart sounds.  Pulmonary:     Effort: Pulmonary effort is normal.     Breath sounds: Normal breath sounds.  Musculoskeletal:     Comments: Normal Muscle Bulk and Muscle Testing Reveals:  Upper Extremities: Full ROM and Muscle Strength 5/5 Thoracic, Paraspinal Tenderness: T-7-T-10 Lumbar Paraspinal Tenderness: L-3-L-5 Lower Extremities: Full ROM and Muscle Strength 5/5 Arises from Chair slowly Narrow Based Gait     Skin:    General: Skin is warm and dry.  Neurological:     Mental Status: She is alert and oriented to person, place, and time.  Psychiatric:        Mood and Affect: Mood normal.        Behavior: Behavior normal.         Assessment & Plan:  1. Lumbago/ Lumbar Spondylosis/ Right  Lumbar Radiculitis: Continue to Monitor. 04/22/ 2025 Continue : HYDROcodone  10/325mg  one tablet every 8 hours as needed #90.  We will continue the opioid monitoring program, this consists of regular clinic visits, examinations, urine drug screen, pill counts as well as use of [place]  Controlled Substance  Reporting system. A 12 month History has been reviewed on the [place]  Controlled Substance Reporting System on 01/13/2024. 2. Fibromyalgia. Continue Current exercise Regime. 01/13/2024 3. Anxiety and depression:Psychiatry Following: [facility]  at [facility] . Continue Counseling at [facility]. 01/13/2024 4. Migraines/ Frontal Headache: On Maxalt . Keep a headache Journal. . Neurology Following. 01/13/2024 5. OSA : PCP Following. Continue to Monitor. 01/13/2024 6. Obesity: Dr Alessandra Ancona Following: Continue  Healthy Diet Regimen and Continue HEP as Tolerated. 01/13/2024 7. Status Post Cervical Spinal Fusion: C5C6- C-6- C-7 Anterior Cervical Discectomy Fusion and Allograft Plate: Dr. Murrel Arnt Following. 01/13/2024 8. Cervicalgia/ Cervical Radiculitis/ S/P Cervical Spinal Fusion:  Continue to Monitor Dr. Murrel Arnt Following. 01/13/2024 9. Muscle Spasm: Continue Tizanidine  as needed.01/13/2024 10.Hereditary and Idiopathic Peripheral Neuropathy Continue with Tens Unit. 01/13/2024. 11.Left Greater Trochanteric Bursitis: No complaints today. . Continue to Alternate Ice and heat Therapy. Continue to Monitor. 01/13/2024. 12. Chronic Left  Knee Pain: No complaints today.  Ortho Following. Continue HEP as Tolerated. Continue to Monitor. 01/13/2024 13. Lateral Epicondylitis of Right Elbow: No complaints today.  Ortho Following. S/P   06/26/2020 Right lateral epicondyle debridement, drilling and repair  By Dr Murrel Arnt. 14. Neuralgia/ Right Facial Droop: Neurology following. Continue to monitor. 01/13/2024. 15. Polyarthralgia: No complaints today. Continue HEP as tolerated. Continue to monitor. 01/13/2024  16. Paresthesia of Right Lower Extremity: No complaints today. Occasionally: Continue current medication regimen. Continue to monitor. 01/13/2024 17 . Bilateral Foot Pain/ Chronic Right Ankle Pain:  Podiatry following. Dr Regal.We will continue to Monitor. 01/13/2024 .    F/U in 1 month .

## 2024-01-17 LAB — DRUG TOX MONITOR 1 W/CONF, ORAL FLD
Amphetamines: NEGATIVE ng/mL (ref <10)
Barbiturates: NEGATIVE ng/mL (ref <10)
Benzodiazepines: NEGATIVE ng/mL (ref <0.50)
Buprenorphine: NEGATIVE ng/mL (ref <0.10)
Cocaine: NEGATIVE ng/mL (ref <5.0)
Codeine: NEGATIVE ng/mL (ref <2.5)
Dihydrocodeine: 4.3 ng/mL — ABNORMAL HIGH (ref <2.5)
Fentanyl: NEGATIVE ng/mL (ref <0.10)
Heroin Metabolite: NEGATIVE ng/mL (ref <1.0)
Hydrocodone: 74.3 ng/mL — ABNORMAL HIGH (ref <2.5)
Hydromorphone: NEGATIVE ng/mL (ref <2.5)
MARIJUANA: NEGATIVE ng/mL (ref <2.5)
MDMA: NEGATIVE ng/mL (ref <10)
Meprobamate: NEGATIVE ng/mL (ref <2.5)
Methadone: NEGATIVE ng/mL (ref <5.0)
Morphine: NEGATIVE ng/mL (ref <2.5)
Nicotine Metabolite: NEGATIVE ng/mL (ref <5.0)
Norhydrocodone: NEGATIVE ng/mL (ref <2.5)
Noroxycodone: NEGATIVE ng/mL (ref <2.5)
Opiates: POSITIVE ng/mL — AB (ref <2.5)
Oxycodone: NEGATIVE ng/mL (ref <2.5)
Oxymorphone: NEGATIVE ng/mL (ref <2.5)
Phencyclidine: NEGATIVE ng/mL (ref <10)
Tapentadol: NEGATIVE ng/mL (ref <5.0)
Tramadol: NEGATIVE ng/mL (ref <5.0)
Zolpidem: NEGATIVE ng/mL (ref <5.0)

## 2024-01-17 LAB — DRUG TOX ALC METAB W/CON, ORAL FLD: Alcohol Metabolite: NEGATIVE ng/mL (ref <25)

## 2024-01-19 ENCOUNTER — Other Ambulatory Visit (HOSPITAL_COMMUNITY): Payer: Self-pay

## 2024-01-27 DIAGNOSIS — Z1212 Encounter for screening for malignant neoplasm of rectum: Secondary | ICD-10-CM | POA: Diagnosis not present

## 2024-01-27 DIAGNOSIS — Z1211 Encounter for screening for malignant neoplasm of colon: Secondary | ICD-10-CM | POA: Diagnosis not present

## 2024-02-09 ENCOUNTER — Encounter: Attending: Physical Medicine & Rehabilitation | Admitting: Registered Nurse

## 2024-02-09 ENCOUNTER — Other Ambulatory Visit: Payer: Self-pay

## 2024-02-09 ENCOUNTER — Encounter: Payer: Self-pay | Admitting: Registered Nurse

## 2024-02-09 VITALS — BP 125/85 | HR 66 | Ht 63.0 in | Wt 226.2 lb

## 2024-02-09 DIAGNOSIS — Z79899 Other long term (current) drug therapy: Secondary | ICD-10-CM | POA: Diagnosis not present

## 2024-02-09 DIAGNOSIS — M542 Cervicalgia: Secondary | ICD-10-CM

## 2024-02-09 DIAGNOSIS — M545 Low back pain, unspecified: Secondary | ICD-10-CM

## 2024-02-09 DIAGNOSIS — M25561 Pain in right knee: Secondary | ICD-10-CM

## 2024-02-09 DIAGNOSIS — G894 Chronic pain syndrome: Secondary | ICD-10-CM

## 2024-02-09 DIAGNOSIS — M7062 Trochanteric bursitis, left hip: Secondary | ICD-10-CM | POA: Diagnosis not present

## 2024-02-09 DIAGNOSIS — M25562 Pain in left knee: Secondary | ICD-10-CM | POA: Diagnosis not present

## 2024-02-09 DIAGNOSIS — M797 Fibromyalgia: Secondary | ICD-10-CM

## 2024-02-09 DIAGNOSIS — G8929 Other chronic pain: Secondary | ICD-10-CM | POA: Diagnosis not present

## 2024-02-09 DIAGNOSIS — Z5181 Encounter for therapeutic drug level monitoring: Secondary | ICD-10-CM

## 2024-02-09 MED ORDER — HYDROCODONE-ACETAMINOPHEN 10-325 MG PO TABS
1.0000 | ORAL_TABLET | Freq: Three times a day (TID) | ORAL | 0 refills | Status: DC | PRN
  Filled 2024-02-09 – 2024-02-12 (×3): qty 90, 30d supply, fill #0

## 2024-02-09 NOTE — Progress Notes (Signed)
 Subjective:    Patient ID: Karina Greer, female    DOB: 01-08-1966, 58 y.o.   MRN: [id number]  HPI: Karina Greer is a 58 y.o. female who returns for follow up appointment for chronic pain and medication refill.She  states her pain is located in her neck, lower back, left hip, bilateral knees and bilateral feet. He  rates his pain 6. Her current exercise regime is walking and performing stretching exercises.  [name] Morphine equivalent is 30.00 MME.   Last Oral Swab was Performed on 01/13/2024, it was consistent.     Pain Inventory Average Pain 8 Pain Right Now 6 My pain is constant, sharp, stabbing, tingling, and aching  In the last 24 hours, has pain interfered with the following? General activity 6 Relation with others 7 Enjoyment of life 7 What TIME of day is your pain at its worst? morning , daytime, evening, and night Sleep (in general) Fair  Pain is worse with: walking, bending, sitting, inactivity, standing, and some activites Pain improves with: rest, heat/ice, therapy/exercise, pacing activities, medication, TENS, and injections Relief from Meds: 7  Family History  Problem Relation Age of Onset   Hypertension Mother    Diabetes Father    Brain cancer Father    Clotting disorder Father        blood clots   Fibromyalgia Sister    Suicidality Sister    Hypertension Other        Cancer, Cerebrovascular disease run on mother side of family   Social History   Socioeconomic History   Marital status: Divorced    Spouse name: Not on file   Number of children: 3   Years of education: HS   Highest education level: 12th grade  Occupational History   Occupation: Designer, industrial/product: UNEMPLOYED    Comment: Disability  Tobacco Use   Smoking status: Never   Smokeless tobacco: Never  Vaping Use   Vaping status: Never Used  Substance and Sexual Activity   Alcohol  use: No   Drug use: No   Sexual activity: Not on file  Other Topics Concern   Not on file   Social History Narrative   Patient is right-handed. She avoids caffeine. She has recently been using the treadmill.   Social Drivers of Corporate investment banker Strain: Not on file  Food Insecurity: Not on file  Transportation Needs: Not on file  Physical Activity: Not on file  Stress: Not on file  Social Connections: Not on file   Past Surgical History:  Procedure Laterality Date   ACHILLES TENDON SURGERY Right 3/16   ANTERIOR CERVICAL DECOMP/DISCECTOMY FUSION N/A 03/11/2016   Procedure: C5-6, C6-7 Anterior Cervical Discectomy and Fusion, Allograft, Plate;  Surgeon: Adah Acron, MD;  Location: [facility] OR;  Service: Orthopedics;  Laterality: N/A;   CHOLECYSTECTOMY  2014   COLONOSCOPY WITH PROPOFOL  N/A 08/25/2017   Procedure: COLONOSCOPY WITH PROPOFOL ;  Surgeon: Cassie Click, MD;  Location: [facility] ENDOSCOPY;  Service: Endoscopy;  Laterality: N/A;   CYSTO WITH HYDRODISTENSION N/A 01/26/2014   Procedure: CYSTOSCOPY/HYDRODISTENSION;  Surgeon: Livingston Rigg, MD;  Location: [facility];  Service: Urology;  Laterality: N/A;   CYSTO/  URETHRAL DILATION/  HYDRODISTENTION/   INSTILLATION THERAPY  07-16-2010//   12-30-2007//   10-27-2006   ELBOW SURGERY Right    EXERCISE TOLERENCE TEST  10-12-2010   NEGATIVE  ADEQUATE ETT/  NO ISCHEMIA OR EVIDENCE HIGH GRADE OBSTRUCTIVE CAD/  NO FURTHER  TEST NEEDED   HYSTEROSCOPY W/  NOVASURE ENDOMETRIAL ABLATION  2014   LAPAROSCOPIC OVARIAN CYST BX  2005   AND  URETEROSCOPIC LASER LITHO  STONE EXTRACTION   SHOULDER OPEN ROTATOR CUFF REPAIR Right 2004   TONSILLECTOMY     TRANSTHORACIC ECHOCARDIOGRAM  06-07-2006   normal study/  ef 60-65%   TUBAL LIGATION Bilateral 1995   Past Surgical History:  Procedure Laterality Date   ACHILLES TENDON SURGERY Right 3/16   ANTERIOR CERVICAL DECOMP/DISCECTOMY FUSION N/A 03/11/2016   Procedure: C5-6, C6-7 Anterior Cervical Discectomy and Fusion, Allograft, Plate;  Surgeon: Adah Acron, MD;  Location: [facility]  OR;  Service: Orthopedics;  Laterality: N/A;   CHOLECYSTECTOMY  2014   COLONOSCOPY WITH PROPOFOL  N/A 08/25/2017   Procedure: COLONOSCOPY WITH PROPOFOL ;  Surgeon: Cassie Click, MD;  Location: [facility] ENDOSCOPY;  Service: Endoscopy;  Laterality: N/A;   CYSTO WITH HYDRODISTENSION N/A 01/26/2014   Procedure: CYSTOSCOPY/HYDRODISTENSION;  Surgeon: Livingston Rigg, MD;  Location: [facility];  Service: Urology;  Laterality: N/A;   CYSTO/  URETHRAL DILATION/  HYDRODISTENTION/   INSTILLATION THERAPY  07-16-2010//   12-30-2007//   10-27-2006   ELBOW SURGERY Right    EXERCISE TOLERENCE TEST  10-12-2010   NEGATIVE  ADEQUATE ETT/  NO ISCHEMIA OR EVIDENCE HIGH GRADE OBSTRUCTIVE CAD/  NO FURTHER TEST NEEDED   HYSTEROSCOPY W/  NOVASURE ENDOMETRIAL ABLATION  2014   LAPAROSCOPIC OVARIAN CYST BX  2005   AND  URETEROSCOPIC LASER LITHO  STONE EXTRACTION   SHOULDER OPEN ROTATOR CUFF REPAIR Right 2004   TONSILLECTOMY     TRANSTHORACIC ECHOCARDIOGRAM  06-07-2006   normal study/  ef 60-65%   TUBAL LIGATION Bilateral 1995   Past Medical History:  Diagnosis Date   Anxiety    Anxiety disorder    Arthritis    Blepharitis    Chronic low back pain    Chronic pain    Depression    Diabetes mellitus without complication (HCC)    Dyslipidemia    Family history of blood clots    Fibromyalgia    GERD (gastroesophageal reflux disease)    Headache    hx migraines   History of kidney stones    History of panic attacks    History of renal calculi    IBS (irritable bowel syndrome)    Interstitial cystitis    Lupus    tested positive for antibodies for lupus. dr to do further tests   Menorrhagia    OSA on CPAP    not used cpap for several weeks   Pneumonia    hx   PONV (postoperative nausea and vomiting)    RLS (restless legs syndrome)    Rosacea    Seasonal asthma    SI (sacroiliac) joint dysfunction    SUI (stress urinary incontinence, female)    UTI (lower urinary tract infection)    hx    White matter abnormality on MRI of brain 02/23/2013   BP 125/85 (BP Location: Left Arm, Cuff Size: Large)   Pulse 66   Ht 5\' 3"  (1.6 m)   Wt 226 lb 3.2 oz (102.6 kg)   SpO2 98%   BMI 40.07 kg/m   Opioid Risk Score:   Fall Risk Score:  `1  Depression screen [facility] 2/9     02/09/2024    9:36 AM 12/11/2023   10:02 AM 07/11/2023    9:40 AM 07/04/2023    9:10 AM 06/26/2023   11:13 AM 05/14/2023  9:07 AM 02/24/2023    8:38 AM  Depression screen PHQ 2/9  Decreased Interest 0 0 0 0 0 0 0  Down, Depressed, Hopeless 0 0 0 0 0 0 0  PHQ - 2 Score 0 0 0 0 0 0 0  Altered sleeping 0        Tired, decreased energy 0        Change in appetite 1        Feeling bad or failure about yourself  0        Trouble concentrating 0        Moving slowly or fidgety/restless 0        Suicidal thoughts 0        PHQ-9 Score 1        Difficult doing work/chores Somewhat difficult           Review of Systems  Musculoskeletal:  Positive for arthralgias and myalgias.       Patient reports pain is widespread throughout her body  All other systems reviewed and are negative.      Objective:   Physical Exam Vitals and nursing note reviewed.  Constitutional:      Appearance: Normal appearance. She is obese.  Neck:     Comments: Cervical Paraspinal Tenderness: C-5-C-6 Cardiovascular:     Rate and Rhythm: Normal rate and regular rhythm.     Pulses: Normal pulses.     Heart sounds: Normal heart sounds.  Pulmonary:     Effort: Pulmonary effort is normal.     Breath sounds: Normal breath sounds.  Musculoskeletal:     Comments: Normal Muscle Bulk and Muscle Testing Reveals:  Upper Extremities: Right: Decreased ROM 90 Degrees and Muscle Strength 5/5 Left Upper Extremity: Full ROM and Muscle Strength 5/5 ,Lumbar Paraspinal Tenderness: L-4-L-5 Lower Extremities: Full ROM and Muscle Strength 5/5 Arises from Chair slowly Narrow Based  Gait     Skin:    General: Skin is warm and dry.  Neurological:      Mental Status: She is alert and oriented to person, place, and time.  Psychiatric:        Mood and Affect: Mood normal.        Behavior: Behavior normal.         Assessment & Plan:  1. Lumbago/ Lumbar Spondylosis/ Right  Lumbar Radiculitis: Continue to Monitor. 02/09/2024 Continue : HYDROcodone  10/325mg  one tablet every 8 hours as needed #90.  We will continue the opioid monitoring program, this consists of regular clinic visits, examinations, urine drug screen, pill counts as well as use of [place]  Controlled Substance Reporting system. A 12 month History has been reviewed on the [place]  Controlled Substance Reporting System on 02/09/2024. 2. Fibromyalgia. Continue Current exercise Regime. 02/09/2024 3. Anxiety and depression:Psychiatry Following: Counselor Jessica  at [facility] . Continue Counseling at [facility]. 02/09/2024 4. Migraines/ Frontal Headache: On Maxalt . Keep a headache Journal. . Neurology Following. 02/09/2024 5. OSA : PCP Following. Continue to Monitor. 02/09/2024 6. Obesity: Dr Alessandra Ancona Following: Continue  Healthy Diet Regimen and Continue HEP as Tolerated. 02/09/2024 7. Status Post Cervical Spinal Fusion: C5C6- C-6- C-7 Anterior Cervical Discectomy Fusion and Allograft Plate: Dr. Murrel Arnt Following. 02/09/2024 8. Cervicalgia/ Cervical Radiculitis/ S/P Cervical Spinal Fusion:  Continue to Monitor Dr. Murrel Arnt Following. 02/09/2024 9. Muscle Spasm: Continue Tizanidine  as needed.02/09/2024 10.Hereditary and Idiopathic Peripheral Neuropathy Continue with Tens Unit. 02/09/2024. 11.Left Greater Trochanteric Bursitis: . Continue to Alternate Ice and heat Therapy. Continue  to Monitor. 02/09/2024. 12. Chronic Left  Knee Pain:  Ortho Following. Continue HEP as Tolerated. Continue to Monitor. 02/09/2024 13. Lateral Epicondylitis of Right Elbow: No complaints today.  Ortho Following. S/P   06/26/2020 Right lateral epicondyle debridement, drilling and repair   By Dr Murrel Arnt. 14. Neuralgia/ Right Facial Droop: Neurology following. Continue to monitor. 02/09/2024. 15. Polyarthralgia: No complaints today. Continue HEP as tolerated. Continue to monitor. 02/09/2024  16. Paresthesia of Right Lower Extremity: No complaints today. Occasionally: Continue current medication regimen. Continue to monitor. 02/09/2024 17 . Bilateral Feet Pain/ Chronic Right Ankle Pain:  Podiatry following. Dr Regal.We will continue to Monitor. 02/09/2024 .    F/U in 1 month .

## 2024-02-10 ENCOUNTER — Other Ambulatory Visit: Payer: Self-pay

## 2024-02-11 ENCOUNTER — Other Ambulatory Visit: Payer: Self-pay

## 2024-02-12 ENCOUNTER — Other Ambulatory Visit: Payer: Self-pay

## 2024-02-12 ENCOUNTER — Other Ambulatory Visit (HOSPITAL_COMMUNITY): Payer: Self-pay

## 2024-02-23 DIAGNOSIS — B372 Candidiasis of skin and nail: Secondary | ICD-10-CM | POA: Diagnosis not present

## 2024-02-23 DIAGNOSIS — R42 Dizziness and giddiness: Secondary | ICD-10-CM | POA: Diagnosis not present

## 2024-02-23 DIAGNOSIS — H6592 Unspecified nonsuppurative otitis media, left ear: Secondary | ICD-10-CM | POA: Diagnosis not present

## 2024-03-08 DIAGNOSIS — Z23 Encounter for immunization: Secondary | ICD-10-CM | POA: Diagnosis not present

## 2024-03-09 ENCOUNTER — Other Ambulatory Visit: Payer: Self-pay

## 2024-03-09 ENCOUNTER — Encounter: Attending: Physical Medicine & Rehabilitation | Admitting: Registered Nurse

## 2024-03-09 ENCOUNTER — Encounter: Payer: Self-pay | Admitting: Registered Nurse

## 2024-03-09 VITALS — BP 117/82 | HR 68 | Ht 63.0 in | Wt 222.0 lb

## 2024-03-09 DIAGNOSIS — M7062 Trochanteric bursitis, left hip: Secondary | ICD-10-CM | POA: Diagnosis not present

## 2024-03-09 DIAGNOSIS — Y92009 Unspecified place in unspecified non-institutional (private) residence as the place of occurrence of the external cause: Secondary | ICD-10-CM | POA: Insufficient documentation

## 2024-03-09 DIAGNOSIS — M7061 Trochanteric bursitis, right hip: Secondary | ICD-10-CM | POA: Insufficient documentation

## 2024-03-09 DIAGNOSIS — M51372 Other intervertebral disc degeneration, lumbosacral region with discogenic back pain and lower extremity pain: Secondary | ICD-10-CM | POA: Insufficient documentation

## 2024-03-09 DIAGNOSIS — Z79899 Other long term (current) drug therapy: Secondary | ICD-10-CM | POA: Insufficient documentation

## 2024-03-09 DIAGNOSIS — G8929 Other chronic pain: Secondary | ICD-10-CM | POA: Insufficient documentation

## 2024-03-09 DIAGNOSIS — Z5181 Encounter for therapeutic drug level monitoring: Secondary | ICD-10-CM | POA: Diagnosis not present

## 2024-03-09 DIAGNOSIS — M5412 Radiculopathy, cervical region: Secondary | ICD-10-CM | POA: Insufficient documentation

## 2024-03-09 DIAGNOSIS — M797 Fibromyalgia: Secondary | ICD-10-CM | POA: Diagnosis not present

## 2024-03-09 DIAGNOSIS — W010XXD Fall on same level from slipping, tripping and stumbling without subsequent striking against object, subsequent encounter: Secondary | ICD-10-CM | POA: Diagnosis not present

## 2024-03-09 DIAGNOSIS — M545 Low back pain, unspecified: Secondary | ICD-10-CM | POA: Insufficient documentation

## 2024-03-09 DIAGNOSIS — G894 Chronic pain syndrome: Secondary | ICD-10-CM | POA: Diagnosis not present

## 2024-03-09 DIAGNOSIS — M542 Cervicalgia: Secondary | ICD-10-CM | POA: Diagnosis not present

## 2024-03-09 DIAGNOSIS — W19XXXD Unspecified fall, subsequent encounter: Secondary | ICD-10-CM | POA: Diagnosis not present

## 2024-03-09 MED ORDER — HYDROCODONE-ACETAMINOPHEN 10-325 MG PO TABS
1.0000 | ORAL_TABLET | Freq: Three times a day (TID) | ORAL | 0 refills | Status: DC | PRN
Start: 2024-03-09 — End: 2024-04-08
  Filled 2024-03-09 – 2024-03-12 (×2): qty 90, 30d supply, fill #0

## 2024-03-09 NOTE — Progress Notes (Unsigned)
 Subjective:    Patient ID: Karina Greer, female    DOB: 10-19-1965, 58 y.o.   MRN: [id number]  HPI: Karina Greer is a 58 y.o. female who returns for follow up appointment for chronic pain and medication refill. states *** pain is located in  ***. rates pain ***. current exercise regime is walking and performing stretching exercises.  Ms Arcos Morphine equivalent is *** MME.   Last Oral Swab was Performed on 01/13/2024, it was consistent.     Pain Inventory Average Pain 9 Pain Right Now 6 My pain is sharp, burning, dull, stabbing, tingling, and aching  In the last 24 hours, has pain interfered with the following? General activity 8 Relation with others 8 Enjoyment of life 9 What TIME of day is your pain at its worst? daytime, evening, and night Sleep (in general) Fair  Pain is worse with: walking, bending, sitting, inactivity, standing, and some activites Pain improves with: rest, heat/ice, therapy/exercise, pacing activities, medication, TENS, and injections Relief from Meds: 7  Family History  Problem Relation Age of Onset   Hypertension Mother    Diabetes Father    Brain cancer Father    Clotting disorder Father        blood clots   Fibromyalgia Sister    Suicidality Sister    Hypertension Other        Cancer, Cerebrovascular disease run on mother side of family   Social History   Socioeconomic History   Marital status: Divorced    Spouse name: Not on file   Number of children: 3   Years of education: HS   Highest education level: 12th grade  Occupational History   Occupation: Designer, industrial/product: UNEMPLOYED    Comment: Disability  Tobacco Use   Smoking status: Never   Smokeless tobacco: Never  Vaping Use   Vaping status: Never Used  Substance and Sexual Activity   Alcohol  use: No   Drug use: No   Sexual activity: Not on file  Other Topics Concern   Not on file  Social History Narrative   Patient is right-handed. She avoids caffeine. She has  recently been using the treadmill.   Social Drivers of Corporate investment banker Strain: Not on file  Food Insecurity: Not on file  Transportation Needs: Not on file  Physical Activity: Not on file  Stress: Not on file  Social Connections: Not on file   Past Surgical History:  Procedure Laterality Date   ACHILLES TENDON SURGERY Right 3/16   ANTERIOR CERVICAL DECOMP/DISCECTOMY FUSION N/A 03/11/2016   Procedure: C5-6, C6-7 Anterior Cervical Discectomy and Fusion, Allograft, Plate;  Surgeon: Adah Acron, MD;  Location: [facility];  Service: Orthopedics;  Laterality: N/A;   CHOLECYSTECTOMY  2014   COLONOSCOPY WITH PROPOFOL  N/A 08/25/2017   Procedure: COLONOSCOPY WITH PROPOFOL ;  Surgeon: Cassie Click, MD;  Location: [facility] ENDOSCOPY;  Service: Endoscopy;  Laterality: N/A;   CYSTO WITH HYDRODISTENSION N/A 01/26/2014   Procedure: CYSTOSCOPY/HYDRODISTENSION;  Surgeon: Livingston Rigg, MD;  Location: [facility];  Service: Urology;  Laterality: N/A;   CYSTO/  URETHRAL DILATION/  HYDRODISTENTION/   INSTILLATION THERAPY  07-16-2010//   12-30-2007//   10-27-2006   ELBOW SURGERY Right    EXERCISE TOLERENCE TEST  10-12-2010   NEGATIVE  ADEQUATE ETT/  NO ISCHEMIA OR EVIDENCE HIGH GRADE OBSTRUCTIVE CAD/  NO FURTHER TEST NEEDED   HYSTEROSCOPY W/  NOVASURE ENDOMETRIAL ABLATION  2014   LAPAROSCOPIC  OVARIAN CYST BX  2005   AND  URETEROSCOPIC LASER LITHO  STONE EXTRACTION   SHOULDER OPEN ROTATOR CUFF REPAIR Right 2004   TONSILLECTOMY     TRANSTHORACIC ECHOCARDIOGRAM  06-07-2006   normal study/  ef 60-65%   TUBAL LIGATION Bilateral 1995   Past Surgical History:  Procedure Laterality Date   ACHILLES TENDON SURGERY Right 3/16   ANTERIOR CERVICAL DECOMP/DISCECTOMY FUSION N/A 03/11/2016   Procedure: C5-6, C6-7 Anterior Cervical Discectomy and Fusion, Allograft, Plate;  Surgeon: Adah Acron, MD;  Location: [facility];  Service: Orthopedics;  Laterality: N/A;   CHOLECYSTECTOMY  2014   COLONOSCOPY  WITH PROPOFOL  N/A 08/25/2017   Procedure: COLONOSCOPY WITH PROPOFOL ;  Surgeon: Cassie Click, MD;  Location: [facility] ENDOSCOPY;  Service: Endoscopy;  Laterality: N/A;   CYSTO WITH HYDRODISTENSION N/A 01/26/2014   Procedure: CYSTOSCOPY/HYDRODISTENSION;  Surgeon: Livingston Rigg, MD;  Location: [facility];  Service: Urology;  Laterality: N/A;   CYSTO/  URETHRAL DILATION/  HYDRODISTENTION/   INSTILLATION THERAPY  07-16-2010//   12-30-2007//   10-27-2006   ELBOW SURGERY Right    EXERCISE TOLERENCE TEST  10-12-2010   NEGATIVE  ADEQUATE ETT/  NO ISCHEMIA OR EVIDENCE HIGH GRADE OBSTRUCTIVE CAD/  NO FURTHER TEST NEEDED   HYSTEROSCOPY W/  NOVASURE ENDOMETRIAL ABLATION  2014   LAPAROSCOPIC OVARIAN CYST BX  2005   AND  URETEROSCOPIC LASER LITHO  STONE EXTRACTION   SHOULDER OPEN ROTATOR CUFF REPAIR Right 2004   TONSILLECTOMY     TRANSTHORACIC ECHOCARDIOGRAM  06-07-2006   normal study/  ef 60-65%   TUBAL LIGATION Bilateral 1995   Past Medical History:  Diagnosis Date   Anxiety    Anxiety disorder    Arthritis    Blepharitis    Chronic low back pain    Chronic pain    Depression    Diabetes mellitus without complication (HCC)    Dyslipidemia    Family history of blood clots    Fibromyalgia    GERD (gastroesophageal reflux disease)    Headache    hx migraines   History of kidney stones    History of panic attacks    History of renal calculi    IBS (irritable bowel syndrome)    Interstitial cystitis    Lupus    tested positive for antibodies for lupus. 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   BP 117/82   Pulse 68   Ht  5' 3 (1.6 m)   Wt 222 lb (100.7 kg)   SpO2 96%   BMI 39.33 kg/m   Opioid Risk Score:   Fall Risk Score:  `1  Depression screen [facility] 2/9     02/09/2024    9:36 AM 12/11/2023   10:02 AM 07/11/2023    9:40 AM 07/04/2023    9:10 AM 06/26/2023   11:13 AM 05/14/2023    9:07 AM 02/24/2023    8:38 AM  Depression screen PHQ 2/9  Decreased Interest 0 0 0 0 0 0  0  Down, Depressed, Hopeless 0 0 0 0 0 0 0  PHQ - 2 Score 0 0 0 0 0 0 0  Altered sleeping 0        Tired, decreased energy 0        Change in appetite 1        Feeling bad or failure about yourself  0        Trouble concentrating 0        Moving slowly or fidgety/restless 0        Suicidal thoughts 0        PHQ-9 Score 1        Difficult doing work/chores Somewhat difficult           Review of Systems  Musculoskeletal:  Positive for arthralgias, back pain, myalgias and neck pain.  All other systems reviewed and are negative.      Objective:   Physical Exam        Assessment & Plan:  1. Lumbago/ Lumbar Spondylosis/ Right  Lumbar Radiculitis: Continue to Monitor. 02/09/2024 Continue : HYDROcodone  10/325mg  one tablet every 8 hours as needed #90.  We will continue the opioid monitoring program, this consists of regular clinic visits, examinations, urine drug screen, pill counts as well as use of [place]  Controlled Substance Reporting system. A 12 month History has been reviewed on the [place]  Controlled Substance Reporting System on 02/09/2024. 2. Fibromyalgia. Continue Current exercise Regime. 02/09/2024 3. Anxiety and depression:Psychiatry Following: Counselor Jessica  at [facility] . Continue Counseling at [facility]. 02/09/2024 4. Migraines/ Frontal Headache: On Maxalt . Keep a headache Journal. . Neurology Following. 02/09/2024 5. OSA : PCP Following. Continue to Monitor. 02/09/2024 6. Obesity: Dr Alessandra Ancona Following: Continue  Healthy Diet Regimen and Continue HEP as Tolerated. 02/09/2024 7.  Status Post Cervical Spinal Fusion: C5C6- C-6- C-7 Anterior Cervical Discectomy Fusion and Allograft Plate: Dr. Murrel Arnt Following. 02/09/2024 8. Cervicalgia/ Cervical Radiculitis/ S/P Cervical Spinal Fusion:  Continue to Monitor Dr. Murrel Arnt Following. 02/09/2024 9. Muscle Spasm: Continue Tizanidine  as needed.02/09/2024 10.Hereditary and Idiopathic Peripheral Neuropathy Continue with Tens Unit. 02/09/2024. 11.Left Greater Trochanteric Bursitis: . Continue to Alternate Ice and heat Therapy. Continue to Monitor. 02/09/2024. 12. Chronic Left  Knee Pain:  Ortho Following. Continue HEP as Tolerated. Continue to Monitor. 02/09/2024 13. Lateral Epicondylitis of Right Elbow: No complaints today.  Ortho Following. S/P   06/26/2020 Right lateral epicondyle debridement, drilling and repair  By Dr Murrel Arnt. 14. Neuralgia/ Right Facial Droop: Neurology following. Continue to monitor. 02/09/2024. 15. Polyarthralgia: No complaints today. Continue HEP as tolerated. Continue to monitor. 02/09/2024  16. Paresthesia of Right Lower Extremity: No complaints today. Occasionally: Continue current medication regimen. Continue to monitor. 02/09/2024 17 . Bilateral Feet Pain/ Chronic Right Ankle Pain:  Podiatry following. Dr Regal.We will continue to Monitor. 02/09/2024 .    F/U in 1 month .

## 2024-03-12 ENCOUNTER — Other Ambulatory Visit: Payer: Self-pay

## 2024-04-08 ENCOUNTER — Other Ambulatory Visit: Payer: Self-pay

## 2024-04-08 ENCOUNTER — Encounter: Attending: Physical Medicine & Rehabilitation | Admitting: Physical Medicine and Rehabilitation

## 2024-04-08 ENCOUNTER — Encounter: Payer: Self-pay | Admitting: Physical Medicine and Rehabilitation

## 2024-04-08 VITALS — BP 129/84 | HR 67 | Ht 63.0 in | Wt 218.4 lb

## 2024-04-08 DIAGNOSIS — M545 Low back pain, unspecified: Secondary | ICD-10-CM | POA: Insufficient documentation

## 2024-04-08 DIAGNOSIS — G894 Chronic pain syndrome: Secondary | ICD-10-CM | POA: Diagnosis not present

## 2024-04-08 DIAGNOSIS — M7072 Other bursitis of hip, left hip: Secondary | ICD-10-CM | POA: Diagnosis not present

## 2024-04-08 DIAGNOSIS — G8929 Other chronic pain: Secondary | ICD-10-CM | POA: Insufficient documentation

## 2024-04-08 DIAGNOSIS — M5010 Cervical disc disorder with radiculopathy, unspecified cervical region: Secondary | ICD-10-CM | POA: Insufficient documentation

## 2024-04-08 MED ORDER — HYDROCODONE-ACETAMINOPHEN 10-325 MG PO TABS
1.0000 | ORAL_TABLET | Freq: Three times a day (TID) | ORAL | 0 refills | Status: DC | PRN
  Filled 2024-04-08 – 2024-04-09 (×2): qty 90, 30d supply, fill #0

## 2024-04-08 MED ORDER — LIDOCAINE HCL 1 % IJ SOLN
4.0000 mL | Freq: Once | INTRAMUSCULAR | Status: AC
  Administered 2024-04-08: 4 mL

## 2024-04-08 MED ORDER — BETAMETHASONE SOD PHOS & ACET 6 (3-3) MG/ML IJ SUSP
6.0000 mg | Freq: Once | INTRAMUSCULAR | Status: AC
Start: 2024-04-08 — End: 2024-04-08
  Administered 2024-04-08: 6 mg via INTRA_ARTICULAR

## 2024-04-08 NOTE — Addendum Note (Signed)
 Addended by: LORILEE SVEN SQUIBB on: 04/08/2024 10:08 AM   Modules accepted: Orders

## 2024-04-08 NOTE — Addendum Note (Signed)
 Addended by: LORILEE SVEN SQUIBB on: 04/08/2024 10:12 AM   Modules accepted: Orders

## 2024-04-08 NOTE — Patient Instructions (Signed)
 Red light therapy  SAD box

## 2024-04-08 NOTE — Progress Notes (Signed)
 Trochanteric bursa injection, left hip  Indication Trochanteric bursitis. Exam has tenderness over the greater trochanter of the hip. Pain has not responded to conservative care such as exercise therapy and oral medications. Pain interferes with sleep or with mobility Informed consent was obtained after describing risks and benefits of the procedure with the patient these include bleeding bruising and infection. Patient has signed written consent form. Patient placed in a lateral decubitus position with the affected hip superior. Point of maximal pain was palpated marked and prepped with Betadine and entered with a needle to bone contact. Needle slightly withdrawn then 6mg  of betamethasone  with 4 cc 1% lidocaine  were injected. Patient tolerated procedure well. Post procedure instructions given.

## 2024-04-09 ENCOUNTER — Other Ambulatory Visit: Payer: Self-pay

## 2024-04-09 ENCOUNTER — Other Ambulatory Visit: Payer: Self-pay | Admitting: Physical Medicine and Rehabilitation

## 2024-04-09 MED ORDER — HYDROXYZINE HCL 25 MG PO TABS
25.0000 mg | ORAL_TABLET | Freq: Three times a day (TID) | ORAL | 3 refills | Status: AC | PRN
  Filled 2024-04-09: qty 30, 10d supply, fill #0
  Filled 2024-05-07 – 2024-06-28 (×2): qty 30, 10d supply, fill #1
  Filled 2024-08-26: qty 30, 10d supply, fill #2
  Filled 2024-10-27: qty 30, 10d supply, fill #3

## 2024-04-13 DIAGNOSIS — M26629 Arthralgia of temporomandibular joint, unspecified side: Secondary | ICD-10-CM | POA: Diagnosis not present

## 2024-04-13 DIAGNOSIS — R42 Dizziness and giddiness: Secondary | ICD-10-CM | POA: Diagnosis not present

## 2024-04-13 DIAGNOSIS — G43009 Migraine without aura, not intractable, without status migrainosus: Secondary | ICD-10-CM | POA: Diagnosis not present

## 2024-04-13 DIAGNOSIS — J019 Acute sinusitis, unspecified: Secondary | ICD-10-CM | POA: Diagnosis not present

## 2024-04-16 DIAGNOSIS — M5412 Radiculopathy, cervical region: Secondary | ICD-10-CM | POA: Insufficient documentation

## 2024-05-03 ENCOUNTER — Other Ambulatory Visit: Payer: Self-pay

## 2024-05-04 ENCOUNTER — Telehealth: Payer: Self-pay | Admitting: Nurse Practitioner

## 2024-05-04 ENCOUNTER — Other Ambulatory Visit: Payer: Self-pay

## 2024-05-04 DIAGNOSIS — M5412 Radiculopathy, cervical region: Secondary | ICD-10-CM | POA: Diagnosis not present

## 2024-05-04 MED ORDER — MECLIZINE HCL 25 MG PO TABS
25.0000 mg | ORAL_TABLET | Freq: Three times a day (TID) | ORAL | 1 refills | Status: DC | PRN
  Filled 2024-05-07 – 2024-05-26 (×2): qty 30, 10d supply, fill #0
  Filled 2024-07-02: qty 30, 10d supply, fill #1

## 2024-05-04 MED ORDER — KETOCONAZOLE 2 % EX SHAM
MEDICATED_SHAMPOO | CUTANEOUS | 1 refills | Status: DC
  Filled 2024-05-04 – 2024-05-26 (×3): qty 120, 30d supply, fill #0

## 2024-05-04 MED ORDER — VITAMIN D (ERGOCALCIFEROL) 1.25 MG (50000 UNIT) PO CAPS: 50000.0000 [IU] | ORAL_CAPSULE | ORAL | 6 refills | Status: DC

## 2024-05-04 MED ORDER — DEXLANSOPRAZOLE 60 MG PO CPDR
60.0000 mg | DELAYED_RELEASE_CAPSULE | Freq: Every day | ORAL | 0 refills | Status: AC
  Filled 2024-05-07 – 2024-05-26 (×2): qty 90, 90d supply, fill #0

## 2024-05-04 NOTE — Telephone Encounter (Addendum)
 Received Albuterol  order from [facility]. Faxed back stating patient is no longer with us . Last seen 09/04/2022-Karina Greer

## 2024-05-04 NOTE — Telephone Encounter (Signed)
 error

## 2024-05-05 ENCOUNTER — Other Ambulatory Visit: Payer: Self-pay

## 2024-05-05 ENCOUNTER — Encounter: Attending: Physical Medicine & Rehabilitation | Admitting: Registered Nurse

## 2024-05-05 ENCOUNTER — Encounter: Payer: Self-pay | Admitting: Registered Nurse

## 2024-05-05 VITALS — BP 123/81 | HR 73 | Ht 63.0 in | Wt 224.0 lb

## 2024-05-05 DIAGNOSIS — G8929 Other chronic pain: Secondary | ICD-10-CM | POA: Insufficient documentation

## 2024-05-05 DIAGNOSIS — M7062 Trochanteric bursitis, left hip: Secondary | ICD-10-CM | POA: Diagnosis not present

## 2024-05-05 DIAGNOSIS — Z79891 Long term (current) use of opiate analgesic: Secondary | ICD-10-CM | POA: Insufficient documentation

## 2024-05-05 DIAGNOSIS — M545 Low back pain, unspecified: Secondary | ICD-10-CM | POA: Insufficient documentation

## 2024-05-05 DIAGNOSIS — M542 Cervicalgia: Secondary | ICD-10-CM | POA: Insufficient documentation

## 2024-05-05 DIAGNOSIS — M797 Fibromyalgia: Secondary | ICD-10-CM | POA: Insufficient documentation

## 2024-05-05 DIAGNOSIS — Z5181 Encounter for therapeutic drug level monitoring: Secondary | ICD-10-CM | POA: Diagnosis not present

## 2024-05-05 DIAGNOSIS — M5412 Radiculopathy, cervical region: Secondary | ICD-10-CM | POA: Insufficient documentation

## 2024-05-05 DIAGNOSIS — G894 Chronic pain syndrome: Secondary | ICD-10-CM | POA: Diagnosis not present

## 2024-05-05 MED ORDER — HYDROCODONE-ACETAMINOPHEN 10-325 MG PO TABS
1.0000 | ORAL_TABLET | Freq: Three times a day (TID) | ORAL | 0 refills | Status: DC | PRN
  Filled 2024-05-05 – 2024-05-07 (×3): qty 90, 30d supply, fill #0
  Filled ????-??-??: fill #0

## 2024-05-05 NOTE — Progress Notes (Signed)
 Subjective:    Patient ID: Karina Greer, female    DOB: 01/28/1966, 58 y.o.   MRN: [id number]  HPI: Karina Greer is a 58 y.o. female who returns for follow up appointment for chronic pain and medication refill. She states her pain is located in her neck radiating into her bilateral shoulders, lower back and left hip. pain She rates her pain 8. Her current exercise regime is walking and performing stretching exercises.  Ms. Kivett Morphine equivalent is 30.00 MME.   Oral Swab was Performed Today.     Pain Inventory Average Pain 9 Pain Right Now 8 My pain is intermittent, constant, sharp, burning, dull, stabbing, tingling, and aching  In the last 24 hours, has pain interfered with the following? General activity 8 Relation with others 8 Enjoyment of life 8 What TIME of day is your pain at its worst? morning , daytime, evening, and night Sleep (in general) Poor  Pain is worse with: walking, bending, sitting, standing, and some activites Pain improves with: rest, heat/ice, therapy/exercise, pacing activities, medication, and injections Relief from Meds: 5  Family History  Problem Relation Age of Onset   Hypertension Mother    Diabetes Father    Brain cancer Father    Clotting disorder Father        blood clots   Fibromyalgia Sister    Suicidality Sister    Hypertension Other        Cancer, Cerebrovascular disease run on mother side of family   Social History   Socioeconomic History   Marital status: Divorced    Spouse name: Not on file   Number of children: 3   Years of education: HS   Highest education level: 12th grade  Occupational History   Occupation: Designer, industrial/product: UNEMPLOYED    Comment: Disability  Tobacco Use   Smoking status: Never   Smokeless tobacco: Never  Vaping Use   Vaping status: Never Used  Substance and Sexual Activity   Alcohol  use: No   Drug use: No   Sexual activity: Not on file  Other Topics Concern   Not on file  Social  History Narrative   Patient is right-handed. She avoids caffeine. She has recently been using the treadmill.   Social Drivers of Corporate investment banker Strain: Not on file  Food Insecurity: Not on file  Transportation Needs: Not on file  Physical Activity: Not on file  Stress: Not on file  Social Connections: Not on file   Past Surgical History:  Procedure Laterality Date   ACHILLES TENDON SURGERY Right 3/16   ANTERIOR CERVICAL DECOMP/DISCECTOMY FUSION N/A 03/11/2016   Procedure: C5-6, C6-7 Anterior Cervical Discectomy and Fusion, Allograft, Plate;  Surgeon: Oneil JAYSON Herald, MD;  Location: [facility] OR;  Service: Orthopedics;  Laterality: N/A;   CHOLECYSTECTOMY  2014   COLONOSCOPY WITH PROPOFOL  N/A 08/25/2017   Procedure: COLONOSCOPY WITH PROPOFOL ;  Surgeon: Viktoria Lamar DASEN, MD;  Location: [facility] ENDOSCOPY;  Service: Endoscopy;  Laterality: N/A;   CYSTO WITH HYDRODISTENSION N/A 01/26/2014   Procedure: CYSTOSCOPY/HYDRODISTENSION;  Surgeon: Alm GORMAN Fragmin, MD;  Location: [facility];  Service: Urology;  Laterality: N/A;   CYSTO/  URETHRAL DILATION/  HYDRODISTENTION/   INSTILLATION THERAPY  07-16-2010//   12-30-2007//   10-27-2006   ELBOW SURGERY Right    EXERCISE TOLERENCE TEST  10-12-2010   NEGATIVE  ADEQUATE ETT/  NO ISCHEMIA OR EVIDENCE HIGH GRADE OBSTRUCTIVE CAD/  NO FURTHER TEST NEEDED  HYSTEROSCOPY W/  NOVASURE ENDOMETRIAL ABLATION  2014   LAPAROSCOPIC OVARIAN CYST BX  2005   AND  URETEROSCOPIC LASER LITHO  STONE EXTRACTION   SHOULDER OPEN ROTATOR CUFF REPAIR Right 2004   TONSILLECTOMY     TRANSTHORACIC ECHOCARDIOGRAM  06-07-2006   normal study/  ef 60-65%   TUBAL LIGATION Bilateral 1995   Past Surgical History:  Procedure Laterality Date   ACHILLES TENDON SURGERY Right 3/16   ANTERIOR CERVICAL DECOMP/DISCECTOMY FUSION N/A 03/11/2016   Procedure: C5-6, C6-7 Anterior Cervical Discectomy and Fusion, Allograft, Plate;  Surgeon: Oneil JAYSON Herald, MD;  Location: [facility];   Service: Orthopedics;  Laterality: N/A;   CHOLECYSTECTOMY  2014   COLONOSCOPY WITH PROPOFOL  N/A 08/25/2017   Procedure: COLONOSCOPY WITH PROPOFOL ;  Surgeon: Viktoria Lamar DASEN, MD;  Location: [facility] ENDOSCOPY;  Service: Endoscopy;  Laterality: N/A;   CYSTO WITH HYDRODISTENSION N/A 01/26/2014   Procedure: CYSTOSCOPY/HYDRODISTENSION;  Surgeon: Alm GORMAN Fragmin, MD;  Location: [facility];  Service: Urology;  Laterality: N/A;   CYSTO/  URETHRAL DILATION/  HYDRODISTENTION/   INSTILLATION THERAPY  07-16-2010//   12-30-2007//   10-27-2006   ELBOW SURGERY Right    EXERCISE TOLERENCE TEST  10-12-2010   NEGATIVE  ADEQUATE ETT/  NO ISCHEMIA OR EVIDENCE HIGH GRADE OBSTRUCTIVE CAD/  NO FURTHER TEST NEEDED   HYSTEROSCOPY W/  NOVASURE ENDOMETRIAL ABLATION  2014   LAPAROSCOPIC OVARIAN CYST BX  2005   AND  URETEROSCOPIC LASER LITHO  STONE EXTRACTION   SHOULDER OPEN ROTATOR CUFF REPAIR Right 2004   TONSILLECTOMY     TRANSTHORACIC ECHOCARDIOGRAM  06-07-2006   normal study/  ef 60-65%   TUBAL LIGATION Bilateral 1995   Past Medical History:  Diagnosis Date   Anxiety    Anxiety disorder    Arthritis    Blepharitis    Chronic low back pain    Chronic pain    Depression    Diabetes mellitus without complication (HCC)    Dyslipidemia    Family history of blood clots    Fibromyalgia    GERD (gastroesophageal reflux disease)    Headache    hx migraines   History of kidney stones    History of panic attacks    History of renal calculi    IBS (irritable bowel syndrome)    Interstitial cystitis    Lupus    tested positive for antibodies for lupus. dr to do further tests   Menorrhagia    OSA on CPAP    not used cpap for several weeks   Pneumonia    hx   PONV (postoperative nausea and vomiting)    RLS (restless legs syndrome)    Rosacea    Seasonal asthma    SI (sacroiliac) joint dysfunction    SUI (stress urinary incontinence, female)    UTI (lower urinary tract infection)    hx    White matter abnormality on MRI of brain 02/23/2013   BP 123/81   Pulse 73   Ht 5' 3 (1.6 m)   Wt 224 lb (101.6 kg)   SpO2 93%   BMI 39.68 kg/m   Opioid Risk Score:   Fall Risk Score:  `1  Depression screen [facility] 2/9     04/08/2024    9:45 AM 02/09/2024    9:36 AM 12/11/2023   10:02 AM 07/11/2023    9:40 AM 07/04/2023    9:10 AM 06/26/2023   11:13 AM 05/14/2023    9:07 AM  Depression screen  PHQ 2/9  Decreased Interest 0 0 0 0 0 0 0  Down, Depressed, Hopeless 0 0 0 0 0 0 0  PHQ - 2 Score 0 0 0 0 0 0 0  Altered sleeping  0       Tired, decreased energy  0       Change in appetite  1       Feeling bad or failure about yourself   0       Trouble concentrating  0       Moving slowly or fidgety/restless  0       Suicidal thoughts  0       PHQ-9 Score  1       Difficult doing work/chores  Somewhat difficult         Review of Systems  Musculoskeletal:  Positive for back pain, myalgias and neck pain.  All other systems reviewed and are negative.      Objective:   Physical Exam Vitals and nursing note reviewed.  Constitutional:      Appearance: Normal appearance.  Neck:     Comments: Cervical Paraspinal Tenderness: C-5-C-6 Cardiovascular:     Rate and Rhythm: Normal rate and regular rhythm.     Pulses: Normal pulses.     Heart sounds: Normal heart sounds.  Pulmonary:     Effort: Pulmonary effort is normal.     Breath sounds: Normal breath sounds.  Musculoskeletal:     Comments: Normal Muscle Bulk and Muscle Testing Reveals:  Upper Extremities: Full ROM and Muscle Strength 5/5  Thoracic and Lumbar Hypersensitivity Left Greater Trochanter Tenderness Lower Extremities: Full ROM and Muscle Strength 5/5 Right Lower Extremity Flexion Produces Pain into her Right Groin and Right Hip Arises from Chair slowly Antalgic  Gait     Skin:    General: Skin is warm and dry.  Neurological:     Mental Status: She is alert and oriented to person, place, and time.  Psychiatric:         Mood and Affect: Mood normal.        Behavior: Behavior normal.          Assessment & Plan:  1. Lumbago/ Lumbar Spondylosis/ Right  Lumbar Radiculitis: Continue to Monitor. 05/05/2024 Continue : HYDROcodone  10/325mg  one tablet every 8 hours as needed #90.  We will continue the opioid monitoring program, this consists of regular clinic visits, examinations, urine drug screen, pill counts as well as use of [place]  Controlled Substance Reporting system. A 12 month History has been reviewed on the   Controlled Substance Reporting System on 05/05/2024. 2. Fibromyalgia. Continue Current exercise Regime. 05/05/2024 3. Anxiety and depression:Psychiatry Following: Counselor Jessica  at [facility] . Continue Counseling at [facility]. 05/05/2024 4. Migraines/ Frontal Headache: On Maxalt . Keep a headache Journal. . Neurology Following. 05/05/2024 5. OSA : PCP Following. Continue to Monitor. 05/05/2024 6. Obesity: Dr Lorilee Following: Continue  Healthy Diet Regimen and Continue HEP as Tolerated. 05/05/2024 7. Status Post Cervical Spinal Fusion: C5C6- C-6- C-7 Anterior Cervical Discectomy Fusion and Allograft Plate: Dr. Barbarann Following. 05/05/2024 8. Cervicalgia/ Cervical Radiculitis/ S/P Cervical Spinal Fusion:  Continue to Monitor Dr. Barbarann Following. 05/05/2024 9. Muscle Spasm: Continue Tizanidine  as needed.05/05/2024 10.Hereditary and Idiopathic Peripheral Neuropathy Continue with Tens Unit. 05/05/2024. 11.Left Greater Trochanteric Bursitis: . Continue to Alternate Ice and heat Therapy. Continue to Monitor. 06813/2025. 12. Chronic Left  Knee Pain:  Ortho Following. Continue HEP as Tolerated. Continue to Monitor. 03/09/2024  13. Lateral Epicondylitis of Right Elbow: No complaints today.  Ortho Following. S/P   06/26/2020 Right lateral epicondyle debridement, drilling and repair  By Dr Barbarann. 14. Neuralgia/ Right Facial Droop: Neurology following. Continue to  monitor. 05/05/2024. 15. Polyarthralgia:  Continue HEP as tolerated. Continue to monitor. 05/05/2024  16. Paresthesia of Right Lower Extremity: No complaints today. Occasionally: Continue current medication regimen. Continue to monitor. 05/05/2024 17 . Bilateral Feet Pain/ Chronic Right Ankle Pain:  Podiatry following. Dr Regal.We will continue to Monitor. 05/05/2024 .    F/U in 1 month .

## 2024-05-07 ENCOUNTER — Other Ambulatory Visit: Payer: Self-pay | Admitting: Physical Medicine and Rehabilitation

## 2024-05-07 ENCOUNTER — Other Ambulatory Visit: Payer: Self-pay

## 2024-05-07 MED ORDER — ONDANSETRON 8 MG PO TBDP
8.0000 mg | ORAL_TABLET | Freq: Three times a day (TID) | ORAL | 5 refills | Status: AC | PRN
  Filled 2024-05-07 – 2024-05-26 (×2): qty 30, 10d supply, fill #0
  Filled 2024-07-02: qty 30, 10d supply, fill #1
  Filled 2024-07-30: qty 30, 10d supply, fill #2
  Filled 2024-08-26: qty 30, 10d supply, fill #3
  Filled 2024-09-27: qty 30, 10d supply, fill #4
  Filled 2024-10-27: qty 30, 10d supply, fill #5

## 2024-05-07 MED FILL — Tizanidine HCl Tab 2 MG (Base Equivalent): ORAL | 17 days supply | Qty: 50 | Fill #0 | Status: CN

## 2024-05-10 ENCOUNTER — Other Ambulatory Visit: Payer: Self-pay

## 2024-05-10 LAB — DRUG TOX MONITOR 1 W/CONF, ORAL FLD
Amphetamines: NEGATIVE ng/mL (ref <10)
Barbiturates: NEGATIVE ng/mL (ref <10)
Benzodiazepines: NEGATIVE ng/mL (ref <0.50)
Buprenorphine: NEGATIVE ng/mL (ref <0.10)
Cocaine: NEGATIVE ng/mL (ref <5.0)
Codeine: NEGATIVE ng/mL (ref <2.5)
Dihydrocodeine: NEGATIVE ng/mL (ref <2.5)
Fentanyl: NEGATIVE ng/mL (ref <0.10)
Heroin Metabolite: NEGATIVE ng/mL (ref <1.0)
Hydrocodone: 6.6 ng/mL — ABNORMAL HIGH (ref <2.5)
Hydromorphone: NEGATIVE ng/mL (ref <2.5)
MARIJUANA: NEGATIVE ng/mL (ref <2.5)
MDMA: NEGATIVE ng/mL (ref <10)
Meprobamate: NEGATIVE ng/mL (ref <2.5)
Methadone: NEGATIVE ng/mL (ref <5.0)
Morphine: NEGATIVE ng/mL (ref <2.5)
Nicotine Metabolite: NEGATIVE ng/mL (ref <5.0)
Norhydrocodone: NEGATIVE ng/mL (ref <2.5)
Noroxycodone: NEGATIVE ng/mL (ref <2.5)
Opiates: POSITIVE ng/mL — AB (ref <2.5)
Oxycodone: NEGATIVE ng/mL (ref <2.5)
Oxymorphone: NEGATIVE ng/mL (ref <2.5)
Phencyclidine: NEGATIVE ng/mL (ref <10)
Tapentadol: NEGATIVE ng/mL (ref <5.0)
Tramadol: NEGATIVE ng/mL (ref <5.0)
Zolpidem: NEGATIVE ng/mL (ref <5.0)

## 2024-05-10 LAB — DRUG TOX ALC METAB W/CON, ORAL FLD: Alcohol Metabolite: NEGATIVE ng/mL (ref <25)

## 2024-05-17 DIAGNOSIS — M5412 Radiculopathy, cervical region: Secondary | ICD-10-CM | POA: Diagnosis not present

## 2024-05-17 DIAGNOSIS — Z8669 Personal history of other diseases of the nervous system and sense organs: Secondary | ICD-10-CM | POA: Diagnosis not present

## 2024-05-18 ENCOUNTER — Other Ambulatory Visit: Payer: Self-pay

## 2024-05-26 ENCOUNTER — Ambulatory Visit (INDEPENDENT_AMBULATORY_CARE_PROVIDER_SITE_OTHER)

## 2024-05-26 ENCOUNTER — Other Ambulatory Visit: Payer: Self-pay

## 2024-05-26 ENCOUNTER — Encounter: Payer: Self-pay | Admitting: Podiatry

## 2024-05-26 ENCOUNTER — Ambulatory Visit (INDEPENDENT_AMBULATORY_CARE_PROVIDER_SITE_OTHER): Admitting: Podiatry

## 2024-05-26 VITALS — Ht 63.0 in | Wt 224.0 lb

## 2024-05-26 DIAGNOSIS — M7661 Achilles tendinitis, right leg: Secondary | ICD-10-CM | POA: Diagnosis not present

## 2024-05-26 DIAGNOSIS — M722 Plantar fascial fibromatosis: Secondary | ICD-10-CM | POA: Diagnosis not present

## 2024-05-27 NOTE — Progress Notes (Signed)
 Subjective:   Patient ID: Karina Greer, female   DOB: 58 y.o.   MRN: [id number]   HPI Patient continues to experience tremendous discomfort in the back of the right heel and has not been seen in approximate 11 months.  States that she has just been dealing with it but becoming increasingly difficult to deal with.  She did have surgery by orthopedic surgeon approximately 30 years ago but they just removed a lateral spur and the entire back of the heel has remained sore and has not responded to conservative   ROS      Objective:  Physical Exam  Neurovascular status intact with quite a bit of discomfort in the posterior aspect of the right heel with thickness around the area and pain both medial lateral and central around the Achilles insertion right     Assessment:  Chronic Achilles tendinitis right moderate equinus with inflammation pain and bone spur formation     Plan:  H&P reviewed condition in great length and discussed.  I do think given this long-term history of failure to respond conservatively with PRP injection which had been administered last year also that detachment of the tendon and removal of all posterior spurs with reattachment and possible lengthening will be necessary.  Patient wants to get this done and wants to do it in the next month or 2 and I am referring her to Dr. Prentice Ovens for evaluation and treatment of condition  X-rays indicate large posterior spur right history of having had small implant lateral side where the tendon was probably tacked down no other pathology noted

## 2024-06-01 DIAGNOSIS — Z6839 Body mass index (BMI) 39.0-39.9, adult: Secondary | ICD-10-CM | POA: Diagnosis not present

## 2024-06-01 DIAGNOSIS — R200 Anesthesia of skin: Secondary | ICD-10-CM | POA: Diagnosis not present

## 2024-06-03 ENCOUNTER — Other Ambulatory Visit: Payer: Self-pay

## 2024-06-03 NOTE — Progress Notes (Signed)
 Subjective:    Patient ID: Karina Greer, female    DOB: [birth year], 58 y.o.   MRN: [id number]  HPI: Karina Greer is a 58 y.o. female who returns for follow up appointment for chronic pain and medication refill. She states her pain is located in her neck radiating into her bilateral shoulders, bilateral upper extremities and finger tips, upper- lower back, bilateral hips R>L, left knee and right foot pain. She also reports she has a Fibro flare. She rates her pain 6. Her current exercise regime is walking and performing stretching exercises.  Ms, Funches Morphine equivalent is 30.00 MME.   Last Oral Swab was Performed on 05/05/2024, it was consistent.      Pain Inventory Average Pain 8 Pain Right Now 6 My pain is constant, sharp, burning, stabbing, tingling, and aching  In the last 24 hours, has pain interfered with the following? General activity 7 Relation with others 7 Enjoyment of life 7 What TIME of day is your pain at its worst? morning , daytime, evening, and night Sleep (in general) Poor  Pain is worse with: walking, bending, sitting, inactivity, and standing Pain improves with: rest, heat/ice, therapy/exercise, pacing activities, medication, TENS, and injections Relief from Meds: 7  Family History  Problem Relation Age of Onset   Hypertension Mother    Diabetes Father    Brain cancer Father    Clotting disorder Father        blood clots   Fibromyalgia Sister    Suicidality Sister    Hypertension Other        Cancer, Cerebrovascular disease run on mother side of family   Social History   Socioeconomic History   Marital status: Divorced    Spouse name: Not on file   Number of children: 3   Years of education: HS   Highest education level: 12th grade  Occupational History   Occupation: Designer, industrial/product: UNEMPLOYED    Comment: Disability  Tobacco Use   Smoking status: Never   Smokeless tobacco: Never  Vaping Use   Vaping status: Never Used   Substance and Sexual Activity   Alcohol  use: No   Drug use: No   Sexual activity: Not on file  Other Topics Concern   Not on file  Social History Narrative   Patient is right-handed. She avoids caffeine. She has recently been using the treadmill.   Social Drivers of Corporate investment banker Strain: Not on file  Food Insecurity: Not on file  Transportation Needs: Not on file  Physical Activity: Not on file  Stress: Not on file  Social Connections: Not on file   Past Surgical History:  Procedure Laterality Date   ACHILLES TENDON SURGERY Right 3/16   ANTERIOR CERVICAL DECOMP/DISCECTOMY FUSION N/A 03/11/2016   Procedure: C5-6, C6-7 Anterior Cervical Discectomy and Fusion, Allograft, Plate;  Surgeon: Oneil JAYSON Herald, MD;  Location: [facility] OR;  Service: Orthopedics;  Laterality: N/A;   CHOLECYSTECTOMY  2014   COLONOSCOPY WITH PROPOFOL  N/A 08/25/2017   Procedure: COLONOSCOPY WITH PROPOFOL ;  Surgeon: Viktoria Lamar DASEN, MD;  Location: [facility] ENDOSCOPY;  Service: Endoscopy;  Laterality: N/A;   CYSTO WITH HYDRODISTENSION N/A 01/26/2014   Procedure: CYSTOSCOPY/HYDRODISTENSION;  Surgeon: Alm GORMAN Fragmin, MD;  Location: [facility];  Service: Urology;  Laterality: N/A;   CYSTO/  URETHRAL DILATION/  HYDRODISTENTION/   INSTILLATION THERAPY  07-16-2010//   12-30-2007//   10-27-2006   ELBOW SURGERY Right    EXERCISE TOLERENCE  TEST  10-12-2010   NEGATIVE  ADEQUATE ETT/  NO ISCHEMIA OR EVIDENCE HIGH GRADE OBSTRUCTIVE CAD/  NO FURTHER TEST NEEDED   HYSTEROSCOPY W/  NOVASURE ENDOMETRIAL ABLATION  2014   LAPAROSCOPIC OVARIAN CYST BX  2005   AND  URETEROSCOPIC LASER LITHO  STONE EXTRACTION   SHOULDER OPEN ROTATOR CUFF REPAIR Right 2004   TONSILLECTOMY     TRANSTHORACIC ECHOCARDIOGRAM  06-07-2006   normal study/  ef 60-65%   TUBAL LIGATION Bilateral 1995   Past Surgical History:  Procedure Laterality Date   ACHILLES TENDON SURGERY Right 3/16   ANTERIOR CERVICAL DECOMP/DISCECTOMY FUSION N/A  03/11/2016   Procedure: C5-6, C6-7 Anterior Cervical Discectomy and Fusion, Allograft, Plate;  Surgeon: Oneil JAYSON Herald, MD;  Location: [facility] OR;  Service: Orthopedics;  Laterality: N/A;   CHOLECYSTECTOMY  2014   COLONOSCOPY WITH PROPOFOL  N/A 08/25/2017   Procedure: COLONOSCOPY WITH PROPOFOL ;  Surgeon: Viktoria Lamar DASEN, MD;  Location: [facility] ENDOSCOPY;  Service: Endoscopy;  Laterality: N/A;   CYSTO WITH HYDRODISTENSION N/A 01/26/2014   Procedure: CYSTOSCOPY/HYDRODISTENSION;  Surgeon: Alm GORMAN Fragmin, MD;  Location: [facility];  Service: Urology;  Laterality: N/A;   CYSTO/  URETHRAL DILATION/  HYDRODISTENTION/   INSTILLATION THERAPY  07-16-2010//   12-30-2007//   10-27-2006   ELBOW SURGERY Right    EXERCISE TOLERENCE TEST  10-12-2010   NEGATIVE  ADEQUATE ETT/  NO ISCHEMIA OR EVIDENCE HIGH GRADE OBSTRUCTIVE CAD/  NO FURTHER TEST NEEDED   HYSTEROSCOPY W/  NOVASURE ENDOMETRIAL ABLATION  2014   LAPAROSCOPIC OVARIAN CYST BX  2005   AND  URETEROSCOPIC LASER LITHO  STONE EXTRACTION   SHOULDER OPEN ROTATOR CUFF REPAIR Right 2004   TONSILLECTOMY     TRANSTHORACIC ECHOCARDIOGRAM  06-07-2006   normal study/  ef 60-65%   TUBAL LIGATION Bilateral 1995   Past Medical History:  Diagnosis Date   Anxiety    Anxiety disorder    Arthritis    Blepharitis    Chronic low back pain    Chronic pain    Depression    Diabetes mellitus without complication (HCC)    Dyslipidemia    Family history of blood clots    Fibromyalgia    GERD (gastroesophageal reflux disease)    Headache    hx migraines   History of kidney stones    History of panic attacks    History of renal calculi    IBS (irritable bowel syndrome)    Interstitial cystitis    Lupus    tested positive for antibodies for lupus. dr to do further tests   Menorrhagia    OSA on CPAP    not used cpap for several weeks   Pneumonia    hx   PONV (postoperative nausea and vomiting)    RLS (restless legs syndrome)    Rosacea    Seasonal  asthma    SI (sacroiliac) joint dysfunction    SUI (stress urinary incontinence, female)    UTI (lower urinary tract infection)    hx   White matter abnormality on MRI of brain 02/23/2013   There were no vitals taken for this visit.  Opioid Risk Score:   Fall Risk Score:  `1  Depression screen [facility] 2/9     04/08/2024    9:45 AM 02/09/2024    9:36 AM 12/11/2023   10:02 AM 07/11/2023    9:40 AM 07/04/2023    9:10 AM 06/26/2023   11:13 AM 05/14/2023    9:07 AM  Depression screen PHQ 2/9  Decreased Interest 0 0 0 0 0 0 0  Down, Depressed, Hopeless 0 0 0 0 0 0 0  PHQ - 2 Score 0 0 0 0 0 0 0  Altered sleeping  0       Tired, decreased energy  0       Change in appetite  1       Feeling bad or failure about yourself   0       Trouble concentrating  0       Moving slowly or fidgety/restless  0       Suicidal thoughts  0       PHQ-9 Score  1       Difficult doing work/chores  Somewhat difficult         Review of Systems  Musculoskeletal:  Positive for back pain, myalgias and neck pain.       Pain all over the body  All other systems reviewed and are negative.      Objective:   Physical Exam Vitals and nursing note reviewed.  Constitutional:      Appearance: Normal appearance.  Neck:     Comments: Cervical Paraspinal Tenderness: C-5-C-6 Cardiovascular:     Rate and Rhythm: Normal rate and regular rhythm.     Pulses: Normal pulses.     Heart sounds: Normal heart sounds.  Pulmonary:     Effort: Pulmonary effort is normal.     Breath sounds: Normal breath sounds.  Musculoskeletal:     Comments: Normal Muscle Bulk and Muscle Testing Reveals:  Upper Extremities: Full ROM and Muscle Strength 5/5 Bilateral AC Joint Tenderness  Thoracic Paraspinal Tenderness: T-1-T-4  Lumbar Paraspinal Tenderness: L-3-L-5 Bilateral Greater Trochanter Tenderness Lower Extremities: Full ROM and Muscle Strength 5/5 Arises from chair with ease Narrow Based  Gait     Skin:    General: Skin is  warm and dry.  Neurological:     Mental Status: She is alert and oriented to person, place, and time.  Psychiatric:        Mood and Affect: Mood normal.        Behavior: Behavior normal.          Assessment & Plan:  1. Lumbago/ Lumbar Spondylosis/ Right  Lumbar Radiculitis: Continue to Monitor. 06/03/2024 Continue : HYDROcodone  10/325mg  one tablet every 8 hours as needed #90.  We will continue the opioid monitoring program, this consists of regular clinic visits, examinations, urine drug screen, pill counts as well as use of [place]  Controlled Substance Reporting system. A 12 month History has been reviewed on the [place]  Controlled Substance Reporting System on 06/03/2024. 2. Fibromyalgia. Continue Current exercise Regime. 06/03/2024 3. Anxiety and depression:Psychiatry Following: Counselor Jessica  at [facility] . Continue Counseling at [facility]. 06/03/2024 4. Migraines/ Frontal Headache: On Maxalt . Keep a headache Journal. . Neurology Following. 06/03/2024 5. OSA : PCP Following. Continue to Monitor. 06/03/2024 6. Obesity: Dr Lorilee Following: Continue  Healthy Diet Regimen and Continue HEP as Tolerated. 06/03/2024 7. Status Post Cervical Spinal Fusion: C5C6- C-6- C-7 Anterior Cervical Discectomy Fusion and Allograft Plate: Dr. Barbarann Following. 06/03/2024 8. Cervicalgia/ Cervical Radiculitis/ S/P Cervical Spinal Fusion:  Continue to Monitor Dr. Barbarann Following. 06/03/2024 9. 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 06/03/2024. 11.Left Greater Trochanteric Bursitis: . Continue to Alternate Ice and heat Therapy. Continue to Monitor. 06/03/2024. 12. Chronic Left  Knee Pain:  Ortho Following. Continue HEP as Tolerated. Continue to Monitor. 06/03/2024 13. Lateral Epicondylitis of Right Elbow: No complaints today.  Ortho Following. S/P   06/26/2020 Right lateral epicondyle  debridement, drilling and repair  By Dr Barbarann. 14. Neuralgia/ Right Facial Droop: Neurology following. Continue to monitor. 06/03/2024. 15. Polyarthralgia:  Continue HEP as tolerated. Continue to monitor. 06/03/2024  16. Paresthesia of Right Lower Extremity: No complaints today. Occasionally: Continue current medication regimen. Continue to monitor. 06/03/2024 17 . Right foot Pain/ Chronic Right Ankle Pain:  Podiatry following. Dr Regal.We will continue to Monitor. 06/03/2024 .    F/U in 1 month .

## 2024-06-04 ENCOUNTER — Encounter: Attending: Physical Medicine & Rehabilitation | Admitting: Registered Nurse

## 2024-06-04 ENCOUNTER — Encounter: Payer: Self-pay | Admitting: Registered Nurse

## 2024-06-04 ENCOUNTER — Other Ambulatory Visit: Payer: Self-pay

## 2024-06-04 ENCOUNTER — Encounter: Payer: Self-pay | Admitting: Pharmacist

## 2024-06-04 VITALS — BP 117/79 | HR 65 | Ht 63.0 in | Wt 226.0 lb

## 2024-06-04 DIAGNOSIS — G8929 Other chronic pain: Secondary | ICD-10-CM | POA: Diagnosis not present

## 2024-06-04 DIAGNOSIS — M797 Fibromyalgia: Secondary | ICD-10-CM | POA: Diagnosis not present

## 2024-06-04 DIAGNOSIS — G894 Chronic pain syndrome: Secondary | ICD-10-CM | POA: Diagnosis not present

## 2024-06-04 DIAGNOSIS — M546 Pain in thoracic spine: Secondary | ICD-10-CM | POA: Insufficient documentation

## 2024-06-04 DIAGNOSIS — M79671 Pain in right foot: Secondary | ICD-10-CM | POA: Diagnosis not present

## 2024-06-04 DIAGNOSIS — Z5181 Encounter for therapeutic drug level monitoring: Secondary | ICD-10-CM | POA: Insufficient documentation

## 2024-06-04 DIAGNOSIS — M5412 Radiculopathy, cervical region: Secondary | ICD-10-CM | POA: Diagnosis not present

## 2024-06-04 DIAGNOSIS — M7062 Trochanteric bursitis, left hip: Secondary | ICD-10-CM | POA: Insufficient documentation

## 2024-06-04 DIAGNOSIS — M25562 Pain in left knee: Secondary | ICD-10-CM | POA: Diagnosis not present

## 2024-06-04 DIAGNOSIS — M7061 Trochanteric bursitis, right hip: Secondary | ICD-10-CM | POA: Diagnosis not present

## 2024-06-04 DIAGNOSIS — M545 Low back pain, unspecified: Secondary | ICD-10-CM | POA: Diagnosis not present

## 2024-06-04 DIAGNOSIS — M542 Cervicalgia: Secondary | ICD-10-CM | POA: Insufficient documentation

## 2024-06-04 MED ORDER — HYDROCODONE-ACETAMINOPHEN 10-325 MG PO TABS
1.0000 | ORAL_TABLET | Freq: Three times a day (TID) | ORAL | 0 refills | Status: DC | PRN
  Filled 2024-06-04: qty 90, 30d supply, fill #0

## 2024-06-07 ENCOUNTER — Other Ambulatory Visit: Payer: Self-pay

## 2024-06-07 DIAGNOSIS — R42 Dizziness and giddiness: Secondary | ICD-10-CM | POA: Diagnosis not present

## 2024-06-07 DIAGNOSIS — R519 Headache, unspecified: Secondary | ICD-10-CM | POA: Diagnosis not present

## 2024-06-07 DIAGNOSIS — R110 Nausea: Secondary | ICD-10-CM | POA: Diagnosis not present

## 2024-06-07 DIAGNOSIS — F40298 Other specified phobia: Secondary | ICD-10-CM | POA: Diagnosis not present

## 2024-06-07 DIAGNOSIS — H53149 Visual discomfort, unspecified: Secondary | ICD-10-CM | POA: Diagnosis not present

## 2024-06-07 DIAGNOSIS — Z1331 Encounter for screening for depression: Secondary | ICD-10-CM | POA: Diagnosis not present

## 2024-06-07 MED ORDER — NORTRIPTYLINE HCL 10 MG PO CAPS
ORAL_CAPSULE | ORAL | 3 refills | Status: AC
  Filled 2024-06-07: qty 60, 33d supply, fill #0

## 2024-06-09 ENCOUNTER — Telehealth: Payer: Self-pay

## 2024-06-09 ENCOUNTER — Ambulatory Visit (INDEPENDENT_AMBULATORY_CARE_PROVIDER_SITE_OTHER)

## 2024-06-09 DIAGNOSIS — M62461 Contracture of muscle, right lower leg: Secondary | ICD-10-CM

## 2024-06-09 DIAGNOSIS — R200 Anesthesia of skin: Secondary | ICD-10-CM | POA: Insufficient documentation

## 2024-06-09 DIAGNOSIS — M79671 Pain in right foot: Secondary | ICD-10-CM | POA: Diagnosis not present

## 2024-06-09 DIAGNOSIS — M9261 Juvenile osteochondrosis of tarsus, right ankle: Secondary | ICD-10-CM

## 2024-06-09 DIAGNOSIS — M7661 Achilles tendinitis, right leg: Secondary | ICD-10-CM

## 2024-06-09 NOTE — Telephone Encounter (Signed)
 Received surgical consent form date 10/13 asked to confirm with pt date.    Left message for pt to call me back to confirm date of surgery 10/13 would be ok with her.

## 2024-06-09 NOTE — Progress Notes (Deleted)
 Subjective:  Patient ID: Karina Greer, female    DOB: 13-Oct-1965,  MRN: [id number]  Chief Complaint  Patient presents with   Foot Pain    Follow up achilles tendonitis right - no change in symptoms, would like to proceed with surgical treatment   Gastroc, haglund's, PT to help with instability 58 y.o. female presents with the above complaint.  She has been dealing with pain to the posterior aspect of her right heel for 1 year.  She has attempted immobilization, injection therapy without any relief.  She did have an MRI completed approximately 1 year ago that showed a prior Achilles tendon repair with mild tendinosis of the Achilles tendon and subcortical bone marrow in the posterior calcaneus with retrocalcaneal bursitis.  She relates to history of Achilles surgery but does not remember what the surgery was for as it was approximately 30 years ago. she states that her symptomatology is the same as it was at that time.  Review of Systems: Negative except as noted in the HPI. Denies N/V/F/Ch.  Past Medical History:  Diagnosis Date   Anxiety    Anxiety disorder    Arthritis    Blepharitis    Chronic low back pain    Chronic pain    Depression    Diabetes mellitus without complication (HCC)    Dyslipidemia    Family history of blood clots    Fibromyalgia    GERD (gastroesophageal reflux disease)    Headache    hx migraines   History of kidney stones    History of panic attacks    History of renal calculi    IBS (irritable bowel syndrome)    Interstitial cystitis    Lupus    tested positive for antibodies for lupus. dr to do further tests   Menorrhagia    OSA on CPAP    not used cpap for several weeks   Pneumonia    hx   PONV (postoperative nausea and vomiting)    RLS (restless legs syndrome)    Rosacea    Seasonal asthma    SI (sacroiliac) joint dysfunction    SUI (stress urinary incontinence, female)    UTI (lower urinary tract infection)    hx   White matter  abnormality on MRI of brain 02/23/2013    Current Outpatient Medications:    ACCU-CHEK GUIDE test strip, USE 1 (ONE) EACH DAILY FOR GLUCOSE MONITORING, Disp: , Rfl:    Accu-Chek Softclix Lancets lancets, SMARTSIG:1 stick Topical Daily, Disp: , Rfl:    albuterol  (PROVENTIL ) (2.5 MG/3ML) 0.083% nebulizer solution, USE 1 VIAL IN NEBULIZER EVERY 6 HOURS, Disp: 120 mL, Rfl: 11   albuterol  (VENTOLIN  HFA) 108 (90 Base) MCG/ACT inhaler, Inhale into the lungs., Disp: , Rfl:    alendronate (FOSAMAX) 70 MG tablet, Take 70 mg by mouth once a week., Disp: , Rfl:    ammonium lactate  (LAC-HYDRIN ) 12 % lotion, APPLY 1 APPLICATION TOPICALLY AS NEEDED FOR DRY SKIN., Disp: 400 mL, Rfl: 0   b complex vitamins tablet, Take 1 tablet by mouth daily., Disp: , Rfl:    chlorhexidine  (PERIDEX ) 0.12 % solution, SMARTSIG:By Mouth, Disp: , Rfl:    Cholecalciferol (VITAMIN D ) 50 MCG (2000 UT) CAPS, Take 1 capsule by mouth once a week., Disp: , Rfl:    dexlansoprazole  (DEXILANT ) 60 MG capsule, Take 1 capsule (60 mg total) by mouth daily., Disp: 90 capsule, Rfl: 0   dicyclomine  (BENTYL ) 10 MG capsule, Take 10 mg by mouth 2 (two)  times daily as needed., Disp: , Rfl:    EPINEPHrine  (EPIPEN  2-PAK) 0.3 mg/0.3 mL IJ SOAJ injection, Use as directed, Disp: 1 each, Rfl: 1   fexofenadine (ALLEGRA) 180 MG tablet, Take 180 mg by mouth daily., Disp: , Rfl:    fluticasone  (FLOVENT  HFA) 110 MCG/ACT inhaler, TAKE 1 PUFF BY MOUTH TWICE A DAY, Disp: 12 each, Rfl: 5   HYDROcodone -acetaminophen  (NORCO) 10-325 MG tablet, Take 1 tablet by mouth every 8 (eight) hours as needed., Disp: 90 tablet, Rfl: 0   hydrocortisone valerate cream (WESTCORT) 0.2 %, Apply topically 2 (two) times daily., Disp: , Rfl:    hydrOXYzine  (ATARAX ) 25 MG tablet, Take 1 tablet (25 mg total) by mouth every 8 (eight) hours as needed., Disp: 30 tablet, Rfl: 3   ketoconazole  (NIZORAL ) 2 % shampoo, Apply 1 application topically 2 (two) times a week. , Disp: , Rfl:    KRILL OIL  PO, Take by mouth., Disp: , Rfl:    lidocaine  (LIDODERM ) 5 %, Place 1 patch onto the skin daily. Remove & Discard patch within 12 hours or as directed by MD, Disp: 90 patch, Rfl: 3   magnesium oxide (MAG-OX) 400 MG tablet, Take by mouth., Disp: , Rfl:    meclizine  (ANTIVERT ) 25 MG tablet, Take 1 tablet (25 mg total) by mouth 3 (three) times daily as needed for dizziness., Disp: 30 tablet, Rfl: 1   nortriptyline  (PAMELOR ) 10 MG capsule, Take 1 capsule (10 mg total) by mouth at bedtime for 7 days, THEN 2 capsules (20 mg total) at bedtime., Disp: 60 capsule, Rfl: 3   ondansetron  (ZOFRAN -ODT) 8 MG disintegrating tablet, Dissolve 1 tablet (8 mg total) by mouth 3 (three) times daily as needed for nausea/ dizziness., Disp: 30 tablet, Rfl: 5   SPIRULINA PO, Take 1 capsule by mouth daily., Disp: , Rfl:    tiZANidine  (ZANAFLEX ) 2 MG tablet, Take 1 tablet (2 mg total) by mouth every 8 (eight) hours as needed., Disp: 50 tablet, Rfl: 2   valACYclovir (VALTREX) 1000 MG tablet, Take by mouth., Disp: , Rfl:    Vitamin D , Ergocalciferol , (DRISDOL ) 1.25 MG (50000 UNIT) CAPS capsule, Take 50,000 Units by mouth 2 (two) times a week., Disp: , Rfl:   Social History   Tobacco Use  Smoking Status Never  Smokeless Tobacco Never    Allergies  Allergen Reactions   Alendronate Sodium Anaphylaxis    Closes throat up   Doxycycline Other (See Comments)   Gadolinium Derivatives Hives, Nausea And Vomiting, Nausea Only and Rash    Other Reaction(s): GI Intolerance   Iodinated Contrast Media Hives and Swelling   Milnacipran Hcl Other (See Comments)    Makes patient bleed from everywhere.   Elavil [Amitriptyline Hcl] Other (See Comments)    Hallucinations    Tramadol Palpitations and Other (See Comments)    Tachycardia    Atorvastatin    Carbapenems Other (See Comments)    Felt like death Felt like death   Clonazepam    Codeine Sulfate Other (See Comments)   Cymbalta [Duloxetine Hcl] Other (See Comments)     Cannot remember    Duloxetine Other (See Comments)    Facial swelling   Ivermectin     Also Ivermectin  Other Reaction(s): Other (See Comments)   Lamotrigine     Methocarbamol     Morphine Other (See Comments)    Caused anxiety attack pt felt like she was going to have a heart attack Tachycardia per pt   Nsaids Other (See Comments)  Upset stomach Upset stomach   Other Other (See Comments)    bandaid bandaid bandaid bandaid   Ropinirole     Other reaction(s): Other (See Comments) Patient doesn't remember   Skelaxin Other (See Comments)   Sulfamethoxazole    Tessalon [Benzonatate] Other (See Comments)    FEELS LIKE I'M CHOKING   Tolmetin    Vilazodone    Diphenhydramine  Hcl Anxiety   Gabapentin  Nausea Only    Pt was unable to get out of bed, increased appetite  Pt was unable to get out of bed, increased appetite  Pt was unable to get out of bed, increased appetite    Oxycodone Itching and Rash    SEVERE ITCHING   Oxycodone-Acetaminophen  Palpitations   Tetracycline Rash   Objective:  There were no vitals filed for this visit. There is no height or weight on file to calculate BMI. Constitutional Well developed. Well nourished. Oriented to person, place, and time.  Vascular Dorsalis pedis pulses palpable bilaterally. Posterior tibial pulses palpable bilaterally. Capillary refill normal to all digits.  No cyanosis or clubbing noted. Pedal hair growth normal.  Neurologic Normal speech. Epicritic sensation to light touch grossly present bilaterally. Negative tinel sign at tarsal tunnel bilaterally.   Dermatologic Skin texture and turgor are within normal limits.  No open wounds. No skin lesions.  Musculoskeletal: Right foot focused. 5 out of 5 muscle strength to all major pedal muscle groups.  Hindfoot range of motion smooth and pain-free.  Mild global cavus foot structure.  Minimal pain to palpation of lateral ligament complex.  Significant pain to palpation at  the insertion of the Achilles tendon complex.  No pain at watershed area.  Gastrocnemius equinus present with knee extended the ankle is unable to get to 90, able to get to 90 with the knee flexed.   Radiographs: Previously taken imaging was reviewed today.  This does show an anchor in the posterior calcaneus consistent with an Achilles tendon repair.  There is bony spurring within the tendon distally, increased soft tissue density and an enlarged posterior calcaneus that is easily palpable.   Assessment:   1. Achilles tendinitis, right leg   2. Foot pain, right   3. Haglund's deformity, right   4. Gastrocnemius equinus, right    Plan:  - Patient was evaluated and treated and all questions answered.  Achilles tendinitis, Haglund's deformity, gastrocnemius equinus of the right -Discussed with patient conservative versus surgical treatment options, at this point the patient has failed conservative treatment.  She agrees with this and has been dealing with the pain for over 1 year.  She is interested in surgical intervention.   - Based on her pathology and previous imaging, recommend gastrocnemius recession, secondary Achilles repair with reattachment, calcaneal ostectomy.  We did discuss doing physical therapy after her skin heals, this would also likely help with her lateral ankle ligament complex that occasionally bothers her.  She does not feel that it is painful or problematic enough to warrant repair.  Informed surgical risk consent was reviewed and read aloud to the patient.  I reviewed the imaging.  I have discussed my findings with the patient in great detail.  I have discussed all risks including but not limited to infection, stiffness, scarring, limp, disability, deformity, damage to blood vessels and nerves, numbness, poor healing, need for braces, arthritis, chronic pain, amputation, and death.  All benefits and realistic expectations discussed in great detail.  I have made no promises  as to the outcome.  I have provided realistic expectations.  I have offered the patient a 2nd opinion, which they have declined and assured me they preferred to proceed despite the risks.  Planned procedures: Gastrocnemius recession, secondary Achilles repair with reattachment, calcaneal ostectomy  Post-operative weight bearing status: NWB in post-operative splint  VTE risk assessment/need for prophylaxis:  Aspirin 81mg  BID x 21 days or until weightbearing   Post-operative pain management: Oxycodone/Acetaminophen  5mg /325mg  q6h PRN  Patient risk factors that were discussed: obesity   Follow-up after surgery  Prentice Ovens, DPM AACFAS Fellowship Trained Podiatric Surgeon [facility]

## 2024-06-09 NOTE — Telephone Encounter (Signed)
 Pt called back and left message returning my call 2 times.  I returned call and pt is scheduled for 07/05/24.  Not on blood thinners or GLP1 medications and pharmacy correct in chart.

## 2024-06-09 NOTE — Progress Notes (Signed)
 Subjective:  Patient ID: Karina Greer, female    DOB: [birth year],  MRN: [id number]  Chief Complaint  Patient presents with   Foot Pain    Follow up achilles tendonitis right - no change in symptoms, would like to proceed with surgical treatment   Gastroc, haglund's, PT to help with instability 58 y.o. female presents with the above complaint.  She has been dealing with pain to the posterior aspect of her right heel for 1 year.  She has attempted immobilization, injection therapy without any relief.  She did have an MRI completed approximately 1 year ago that showed a prior Achilles tendon repair with mild tendinosis of the Achilles tendon and subcortical bone marrow in the posterior calcaneus with retrocalcaneal bursitis.  She relates to history of Achilles surgery but does not remember what the surgery was for as it was approximately 30 years ago. she states that her symptomatology is the same as it was at that time.  Review of Systems: Negative except as noted in the HPI. Denies N/V/F/Ch.  Past Medical History:  Diagnosis Date   Anxiety    Anxiety disorder    Arthritis    Blepharitis    Chronic low back pain    Chronic pain    Depression    Diabetes mellitus without complication (HCC)    Dyslipidemia    Family history of blood clots    Fibromyalgia    GERD (gastroesophageal reflux disease)    Headache    hx migraines   History of kidney stones    History of panic attacks    History of renal calculi    IBS (irritable bowel syndrome)    Interstitial cystitis    Lupus    tested positive for antibodies for lupus. dr to do further tests   Menorrhagia    OSA on CPAP    not used cpap for several weeks   Pneumonia    hx   PONV (postoperative nausea and vomiting)    RLS (restless legs syndrome)    Rosacea    Seasonal asthma    SI (sacroiliac) joint dysfunction    SUI (stress urinary incontinence, female)    UTI (lower urinary tract infection)    hx   White matter  abnormality on MRI of brain 02/23/2013    Current Outpatient Medications:    ACCU-CHEK GUIDE test strip, USE 1 (ONE) EACH DAILY FOR GLUCOSE MONITORING, Disp: , Rfl:    Accu-Chek Softclix Lancets lancets, SMARTSIG:1 stick Topical Daily, Disp: , Rfl:    albuterol  (PROVENTIL ) (2.5 MG/3ML) 0.083% nebulizer solution, USE 1 VIAL IN NEBULIZER EVERY 6 HOURS, Disp: 120 mL, Rfl: 11   albuterol  (VENTOLIN  HFA) 108 (90 Base) MCG/ACT inhaler, Inhale into the lungs., Disp: , Rfl:    alendronate (FOSAMAX) 70 MG tablet, Take 70 mg by mouth once a week., Disp: , Rfl:    ammonium lactate  (LAC-HYDRIN ) 12 % lotion, APPLY 1 APPLICATION TOPICALLY AS NEEDED FOR DRY SKIN., Disp: 400 mL, Rfl: 0   b complex vitamins tablet, Take 1 tablet by mouth daily., Disp: , Rfl:    chlorhexidine  (PERIDEX ) 0.12 % solution, SMARTSIG:By Mouth, Disp: , Rfl:    Cholecalciferol (VITAMIN D ) 50 MCG (2000 UT) CAPS, Take 1 capsule by mouth once a week., Disp: , Rfl:    dexlansoprazole  (DEXILANT ) 60 MG capsule, Take 1 capsule (60 mg total) by mouth daily., Disp: 90 capsule, Rfl: 0   dicyclomine  (BENTYL ) 10 MG capsule, Take 10 mg by mouth 2 (two)  times daily as needed., Disp: , Rfl:    EPINEPHrine  (EPIPEN  2-PAK) 0.3 mg/0.3 mL IJ SOAJ injection, Use as directed, Disp: 1 each, Rfl: 1   fexofenadine (ALLEGRA) 180 MG tablet, Take 180 mg by mouth daily., Disp: , Rfl:    fluticasone  (FLOVENT  HFA) 110 MCG/ACT inhaler, TAKE 1 PUFF BY MOUTH TWICE A DAY, Disp: 12 each, Rfl: 5   HYDROcodone -acetaminophen  (NORCO) 10-325 MG tablet, Take 1 tablet by mouth every 8 (eight) hours as needed., Disp: 90 tablet, Rfl: 0   hydrocortisone valerate cream (WESTCORT) 0.2 %, Apply topically 2 (two) times daily., Disp: , Rfl:    hydrOXYzine  (ATARAX ) 25 MG tablet, Take 1 tablet (25 mg total) by mouth every 8 (eight) hours as needed., Disp: 30 tablet, Rfl: 3   ketoconazole  (NIZORAL ) 2 % shampoo, Apply 1 application topically 2 (two) times a week. , Disp: , Rfl:    KRILL OIL  PO, Take by mouth., Disp: , Rfl:    lidocaine  (LIDODERM ) 5 %, Place 1 patch onto the skin daily. Remove & Discard patch within 12 hours or as directed by MD, Disp: 90 patch, Rfl: 3   magnesium oxide (MAG-OX) 400 MG tablet, Take by mouth., Disp: , Rfl:    meclizine  (ANTIVERT ) 25 MG tablet, Take 1 tablet (25 mg total) by mouth 3 (three) times daily as needed for dizziness., Disp: 30 tablet, Rfl: 1   nortriptyline  (PAMELOR ) 10 MG capsule, Take 1 capsule (10 mg total) by mouth at bedtime for 7 days, THEN 2 capsules (20 mg total) at bedtime., Disp: 60 capsule, Rfl: 3   ondansetron  (ZOFRAN -ODT) 8 MG disintegrating tablet, Dissolve 1 tablet (8 mg total) by mouth 3 (three) times daily as needed for nausea/ dizziness., Disp: 30 tablet, Rfl: 5   SPIRULINA PO, Take 1 capsule by mouth daily., Disp: , Rfl:    tiZANidine  (ZANAFLEX ) 2 MG tablet, Take 1 tablet (2 mg total) by mouth every 8 (eight) hours as needed., Disp: 50 tablet, Rfl: 2   valACYclovir (VALTREX) 1000 MG tablet, Take by mouth., Disp: , Rfl:    Vitamin D , Ergocalciferol , (DRISDOL ) 1.25 MG (50000 UNIT) CAPS capsule, Take 50,000 Units by mouth 2 (two) times a week., Disp: , Rfl:   Social History   Tobacco Use  Smoking Status Never  Smokeless Tobacco Never    Allergies  Allergen Reactions   Alendronate Sodium Anaphylaxis    Closes throat up   Doxycycline Other (See Comments)   Gadolinium Derivatives Hives, Nausea And Vomiting, Nausea Only and Rash    Other Reaction(s): GI Intolerance   Iodinated Contrast Media Hives and Swelling   Milnacipran Hcl Other (See Comments)    Makes patient bleed from everywhere.   Elavil [Amitriptyline Hcl] Other (See Comments)    Hallucinations    Tramadol Palpitations and Other (See Comments)    Tachycardia    Atorvastatin    Carbapenems Other (See Comments)    Felt like death Felt like death   Clonazepam    Codeine Sulfate Other (See Comments)   Cymbalta [Duloxetine Hcl] Other (See Comments)     Cannot remember    Duloxetine Other (See Comments)    Facial swelling   Ivermectin     Also Ivermectin  Other Reaction(s): Other (See Comments)   Lamotrigine     Methocarbamol     Morphine Other (See Comments)    Caused anxiety attack pt felt like she was going to have a heart attack Tachycardia per pt   Nsaids Other (See Comments)  Upset stomach Upset stomach   Other Other (See Comments)    bandaid bandaid bandaid bandaid   Ropinirole     Other reaction(s): Other (See Comments) Patient doesn't remember   Skelaxin Other (See Comments)   Sulfamethoxazole    Tessalon [Benzonatate] Other (See Comments)    FEELS LIKE I'M CHOKING   Tolmetin    Vilazodone    Diphenhydramine  Hcl Anxiety   Gabapentin  Nausea Only    Pt was unable to get out of bed, increased appetite  Pt was unable to get out of bed, increased appetite  Pt was unable to get out of bed, increased appetite    Oxycodone Itching and Rash    SEVERE ITCHING   Oxycodone-Acetaminophen  Palpitations   Tetracycline Rash   Objective:  There were no vitals filed for this visit. There is no height or weight on file to calculate BMI. Constitutional Well developed. Well nourished. Oriented to person, place, and time.  Vascular Dorsalis pedis pulses palpable bilaterally. Posterior tibial pulses palpable bilaterally. Capillary refill normal to all digits.  No cyanosis or clubbing noted. Pedal hair growth normal.  Neurologic Normal speech. Epicritic sensation to light touch grossly present bilaterally. Negative tinel sign at tarsal tunnel bilaterally.   Dermatologic Skin texture and turgor are within normal limits.  No open wounds. No skin lesions.  Musculoskeletal: Right foot focused. 5 out of 5 muscle strength to all major pedal muscle groups.  Hindfoot range of motion smooth and pain-free.  Mild global cavus foot structure.  Minimal pain to palpation of lateral ligament complex.  Significant pain to palpation at  the insertion of the Achilles tendon complex.  No pain at watershed area.  Gastrocnemius equinus present with knee extended the ankle is unable to get to 90, able to get to 90 with the knee flexed.   Radiographs: Previously taken imaging was reviewed today.  This does show an anchor in the posterior calcaneus consistent with an Achilles tendon repair.  There is bony spurring within the tendon distally, increased soft tissue density and an enlarged posterior calcaneus that is easily palpable.   Assessment:   1. Achilles tendinitis, right leg   2. Foot pain, right   3. Haglund's deformity, right   4. Gastrocnemius equinus, right    Plan:  - Patient was evaluated and treated and all questions answered.  Achilles tendinitis, Haglund's deformity, gastrocnemius equinus of the right -Discussed with patient conservative versus surgical treatment options, at this point the patient has failed conservative treatment.  She agrees with this and has been dealing with the pain for over 1 year.  She is interested in surgical intervention.   - Based on her pathology and previous imaging, recommend gastrocnemius recession, secondary Achilles repair with reattachment, calcaneal ostectomy.  We did discuss doing physical therapy after her skin heals, this would also likely help with her lateral ankle ligament complex that occasionally bothers her.  She does not feel that it is painful or problematic enough to warrant repair.  Informed surgical risk consent was reviewed and read aloud to the patient.  I reviewed the imaging.  I have discussed my findings with the patient in great detail.  I have discussed all risks including but not limited to infection, stiffness, scarring, limp, disability, deformity, damage to blood vessels and nerves, numbness, poor healing, need for braces, arthritis, chronic pain, amputation, and death.  All benefits and realistic expectations discussed in great detail.  I have made no promises  as to the outcome.  I have provided realistic expectations.  I have offered the patient a 2nd opinion, which they have declined and assured me they preferred to proceed despite the risks.  Planned procedures: Gastrocnemius recession, secondary Achilles repair with reattachment, calcaneal ostectomy  Post-operative weight bearing status: NWB in post-operative splint  VTE risk assessment/need for prophylaxis:  Aspirin 81mg  BID x 21 days or until weightbearing   Post-operative pain management: Hydrocodone /acetaminophen  5/325  Patient risk factors that were discussed: obesity   Follow-up after surgery  Prentice Ovens, DPM AACFAS Fellowship Trained Podiatric Surgeon [facility]

## 2024-06-10 DIAGNOSIS — E559 Vitamin D deficiency, unspecified: Secondary | ICD-10-CM | POA: Diagnosis not present

## 2024-06-10 DIAGNOSIS — E119 Type 2 diabetes mellitus without complications: Secondary | ICD-10-CM | POA: Diagnosis not present

## 2024-06-10 DIAGNOSIS — J309 Allergic rhinitis, unspecified: Secondary | ICD-10-CM | POA: Diagnosis not present

## 2024-06-10 DIAGNOSIS — M810 Age-related osteoporosis without current pathological fracture: Secondary | ICD-10-CM | POA: Diagnosis not present

## 2024-06-10 DIAGNOSIS — E7800 Pure hypercholesterolemia, unspecified: Secondary | ICD-10-CM | POA: Diagnosis not present

## 2024-06-10 DIAGNOSIS — K219 Gastro-esophageal reflux disease without esophagitis: Secondary | ICD-10-CM | POA: Diagnosis not present

## 2024-06-10 DIAGNOSIS — G43009 Migraine without aura, not intractable, without status migrainosus: Secondary | ICD-10-CM | POA: Diagnosis not present

## 2024-06-17 DIAGNOSIS — G5622 Lesion of ulnar nerve, left upper limb: Secondary | ICD-10-CM | POA: Diagnosis not present

## 2024-06-17 DIAGNOSIS — M5412 Radiculopathy, cervical region: Secondary | ICD-10-CM | POA: Diagnosis not present

## 2024-06-28 ENCOUNTER — Other Ambulatory Visit: Payer: Self-pay

## 2024-06-28 MED FILL — Tizanidine HCl Tab 2 MG (Base Equivalent): ORAL | 17 days supply | Qty: 50 | Fill #0 | Status: AC

## 2024-06-30 ENCOUNTER — Other Ambulatory Visit: Payer: Self-pay

## 2024-06-30 ENCOUNTER — Encounter: Payer: Self-pay | Admitting: Registered Nurse

## 2024-06-30 ENCOUNTER — Encounter: Attending: Physical Medicine & Rehabilitation | Admitting: Registered Nurse

## 2024-06-30 VITALS — BP 137/76 | HR 76 | Ht 63.0 in | Wt 222.0 lb

## 2024-06-30 DIAGNOSIS — G8929 Other chronic pain: Secondary | ICD-10-CM | POA: Insufficient documentation

## 2024-06-30 DIAGNOSIS — M545 Low back pain, unspecified: Secondary | ICD-10-CM | POA: Insufficient documentation

## 2024-06-30 DIAGNOSIS — M7062 Trochanteric bursitis, left hip: Secondary | ICD-10-CM | POA: Diagnosis not present

## 2024-06-30 DIAGNOSIS — Z79899 Other long term (current) drug therapy: Secondary | ICD-10-CM | POA: Diagnosis not present

## 2024-06-30 DIAGNOSIS — Z5181 Encounter for therapeutic drug level monitoring: Secondary | ICD-10-CM | POA: Insufficient documentation

## 2024-06-30 DIAGNOSIS — M542 Cervicalgia: Secondary | ICD-10-CM | POA: Insufficient documentation

## 2024-06-30 DIAGNOSIS — M5412 Radiculopathy, cervical region: Secondary | ICD-10-CM | POA: Insufficient documentation

## 2024-06-30 DIAGNOSIS — M7061 Trochanteric bursitis, right hip: Secondary | ICD-10-CM | POA: Insufficient documentation

## 2024-06-30 DIAGNOSIS — M797 Fibromyalgia: Secondary | ICD-10-CM | POA: Diagnosis not present

## 2024-06-30 DIAGNOSIS — G894 Chronic pain syndrome: Secondary | ICD-10-CM | POA: Insufficient documentation

## 2024-06-30 DIAGNOSIS — M546 Pain in thoracic spine: Secondary | ICD-10-CM | POA: Diagnosis not present

## 2024-06-30 MED ORDER — HYDROCODONE-ACETAMINOPHEN 10-325 MG PO TABS
1.0000 | ORAL_TABLET | Freq: Three times a day (TID) | ORAL | 0 refills | Status: DC | PRN
  Filled 2024-06-30 – 2024-07-05 (×4): qty 90, 30d supply, fill #0

## 2024-06-30 NOTE — Progress Notes (Signed)
 Subjective:    Patient ID: Karina Greer, female    DOB: July 08, 1966, 58 y.o.   MRN: [id number]  HPI: Karina Greer is a 58 y.o. female who returns for follow up appointment for chronic pain and medication refill. She states her pain is located in her neck radiating into her left shoulder, upper- lower back and right foot pain. Also reports bilateral hip pain L>R. She rates her pain 6. Her current exercise regime is walking and performing stretching exercises.  Ms. Kayes Morphine equivalent is 30.00 MME.   Last Oral Swab was Performed on 05/05/2024, it was consistent.    Pain Inventory Average Pain 8 Pain Right Now 6 My pain is sharp, burning, dull, stabbing, tingling, and aching  In the last 24 hours, has pain interfered with the following? General activity 6 Relation with others 7 Enjoyment of life 7 What TIME of day is your pain at its worst? morning , daytime, evening, and night Sleep (in general) Poor  Pain is worse with: walking, bending, sitting, inactivity, standing, unsure, and some activites Pain improves with: rest, heat/ice, therapy/exercise, pacing activities, medication, TENS, and injections Relief from Meds: 5  Family History  Problem Relation Age of Onset   Hypertension Mother    Diabetes Father    Brain cancer Father    Clotting disorder Father        blood clots   Fibromyalgia Sister    Suicidality Sister    Hypertension Other        Cancer, Cerebrovascular disease run on mother side of family   Social History   Socioeconomic History   Marital status: Divorced    Spouse name: Not on file   Number of children: 3   Years of education: HS   Highest education level: 12th grade  Occupational History   Occupation: Designer, industrial/product: UNEMPLOYED    Comment: Disability  Tobacco Use   Smoking status: Never   Smokeless tobacco: Never  Vaping Use   Vaping status: Never Used  Substance and Sexual Activity   Alcohol  use: No   Drug use: No   Sexual  activity: Not on file  Other Topics Concern   Not on file  Social History Narrative   Patient is right-handed. She avoids caffeine. She has recently been using the treadmill.   Social Drivers of Corporate investment banker Strain: Not on file  Food Insecurity: Not on file  Transportation Needs: Not on file  Physical Activity: Not on file  Stress: Not on file  Social Connections: Not on file   Past Surgical History:  Procedure Laterality Date   ACHILLES TENDON SURGERY Right 3/16   ANTERIOR CERVICAL DECOMP/DISCECTOMY FUSION N/A 03/11/2016   Procedure: C5-6, C6-7 Anterior Cervical Discectomy and Fusion, Allograft, Plate;  Surgeon: Oneil JAYSON Herald, MD;  Location: [facility] OR;  Service: Orthopedics;  Laterality: N/A;   CHOLECYSTECTOMY  2014   COLONOSCOPY WITH PROPOFOL  N/A 08/25/2017   Procedure: COLONOSCOPY WITH PROPOFOL ;  Surgeon: Viktoria Lamar DASEN, MD;  Location: [facility] ENDOSCOPY;  Service: Endoscopy;  Laterality: N/A;   CYSTO WITH HYDRODISTENSION N/A 01/26/2014   Procedure: CYSTOSCOPY/HYDRODISTENSION;  Surgeon: Alm GORMAN Fragmin, MD;  Location: [facility];  Service: Urology;  Laterality: N/A;   CYSTO/  URETHRAL DILATION/  HYDRODISTENTION/   INSTILLATION THERAPY  07-16-2010//   12-30-2007//   10-27-2006   ELBOW SURGERY Right    EXERCISE TOLERENCE TEST  10-12-2010   NEGATIVE  ADEQUATE ETT/  NO ISCHEMIA  OR EVIDENCE HIGH GRADE OBSTRUCTIVE CAD/  NO FURTHER TEST NEEDED   HYSTEROSCOPY W/  NOVASURE ENDOMETRIAL ABLATION  2014   LAPAROSCOPIC OVARIAN CYST BX  2005   AND  URETEROSCOPIC LASER LITHO  STONE EXTRACTION   SHOULDER OPEN ROTATOR CUFF REPAIR Right 2004   TONSILLECTOMY     TRANSTHORACIC ECHOCARDIOGRAM  06-07-2006   normal study/  ef 60-65%   TUBAL LIGATION Bilateral 1995   Past Surgical History:  Procedure Laterality Date   ACHILLES TENDON SURGERY Right 3/16   ANTERIOR CERVICAL DECOMP/DISCECTOMY FUSION N/A 03/11/2016   Procedure: C5-6, C6-7 Anterior Cervical Discectomy and Fusion,  Allograft, Plate;  Surgeon: Oneil JAYSON Herald, MD;  Location: [facility] OR;  Service: Orthopedics;  Laterality: N/A;   CHOLECYSTECTOMY  2014   COLONOSCOPY WITH PROPOFOL  N/A 08/25/2017   Procedure: COLONOSCOPY WITH PROPOFOL ;  Surgeon: Viktoria Lamar DASEN, MD;  Location: [facility] ENDOSCOPY;  Service: Endoscopy;  Laterality: N/A;   CYSTO WITH HYDRODISTENSION N/A 01/26/2014   Procedure: CYSTOSCOPY/HYDRODISTENSION;  Surgeon: Alm GORMAN Fragmin, MD;  Location: [facility];  Service: Urology;  Laterality: N/A;   CYSTO/  URETHRAL DILATION/  HYDRODISTENTION/   INSTILLATION THERAPY  07-16-2010//   12-30-2007//   10-27-2006   ELBOW SURGERY Right    EXERCISE TOLERENCE TEST  10-12-2010   NEGATIVE  ADEQUATE ETT/  NO ISCHEMIA OR EVIDENCE HIGH GRADE OBSTRUCTIVE CAD/  NO FURTHER TEST NEEDED   HYSTEROSCOPY W/  NOVASURE ENDOMETRIAL ABLATION  2014   LAPAROSCOPIC OVARIAN CYST BX  2005   AND  URETEROSCOPIC LASER LITHO  STONE EXTRACTION   SHOULDER OPEN ROTATOR CUFF REPAIR Right 2004   TONSILLECTOMY     TRANSTHORACIC ECHOCARDIOGRAM  06-07-2006   normal study/  ef 60-65%   TUBAL LIGATION Bilateral 1995   Past Medical History:  Diagnosis Date   Anxiety    Anxiety disorder    Arthritis    Blepharitis    Chronic low back pain    Chronic pain    Depression    Diabetes mellitus without complication (HCC)    Dyslipidemia    Family history of blood clots    Fibromyalgia    GERD (gastroesophageal reflux disease)    Headache    hx migraines   History of kidney stones    History of panic attacks    History of renal calculi    IBS (irritable bowel syndrome)    Interstitial cystitis    Lupus    tested positive for antibodies for lupus. dr to do further tests   Menorrhagia    OSA on CPAP    not used cpap for several weeks   Pneumonia    hx   PONV (postoperative nausea and vomiting)    RLS (restless legs syndrome)    Rosacea    Seasonal asthma    SI (sacroiliac) joint dysfunction    SUI (stress urinary  incontinence, female)    UTI (lower urinary tract infection)    hx   White matter abnormality on MRI of brain 02/23/2013   BP (!) 142/102   Pulse 71   Ht 5' 3 (1.6 m)   Wt 222 lb (100.7 kg)   SpO2 98%   BMI 39.33 kg/m   Opioid Risk Score:   Fall Risk Score:  `1  Depression screen [facility] 2/9     06/04/2024    9:40 AM 04/08/2024    9:45 AM 02/09/2024    9:36 AM 12/11/2023   10:02 AM 07/11/2023    9:40 AM  07/04/2023    9:10 AM 06/26/2023   11:13 AM  Depression screen PHQ 2/9  Decreased Interest 0 0 0 0 0 0 0  Down, Depressed, Hopeless 0 0 0 0 0 0 0  PHQ - 2 Score 0 0 0 0 0 0 0  Altered sleeping   0      Tired, decreased energy   0      Change in appetite   1      Feeling bad or failure about yourself    0      Trouble concentrating   0      Moving slowly or fidgety/restless   0      Suicidal thoughts   0      PHQ-9 Score   1      Difficult doing work/chores   Somewhat difficult          Review of Systems  Musculoskeletal:  Positive for myalgias.       Right foot pain  All other systems reviewed and are negative.      Objective:   Physical Exam Vitals and nursing note reviewed.  Constitutional:      Appearance: Normal appearance.  Neck:     Comments: Cervical Paraspinal Tenderness: C-5-C-6 Mainly Left Side  Cardiovascular:     Rate and Rhythm: Normal rate.     Pulses: Normal pulses.     Heart sounds: Normal heart sounds.  Pulmonary:     Effort: Pulmonary effort is normal.     Breath sounds: Normal breath sounds.  Musculoskeletal:     Comments: Normal Muscle Bulk and Muscle Testing Reveals:  Upper Extremities: Full ROM and Muscle Strength 5/5 Left AC Joint Tenderness  Thoracic and Lumbar Hypersensitivity: Mainly Left Side  Bilateral Greater Trochanter Bursitis: L>R Lower Extremities: Full ROM and Muscle Strength 5/5 Arises from Chair slowly Narrow Based  Gait     Skin:    General: Skin is warm and dry.  Neurological:     Mental Status: She is alert and  oriented to person, place, and time.  Psychiatric:        Mood and Affect: Mood normal.        Behavior: Behavior normal.          Assessment & Plan:  1. Lumbago/ Lumbar Spondylosis/ Right  Lumbar Radiculitis: Continue to Monitor. 06/30/2024 Continue : HYDROcodone  10/325mg  one tablet every 8 hours as needed #90.  We will continue the opioid monitoring program, this consists of regular clinic visits, examinations, urine drug screen, pill counts as well as use of [place]  Controlled Substance Reporting system. A 12 month History has been reviewed on the [place]  Controlled Substance Reporting System on 06/30/2024. 2. Fibromyalgia. Continue Current exercise Regime. 06/30/2024 3. Anxiety and depression:Psychiatry Following: Counselor Jessica  at [facility] . Continue Counseling at [facility]. 06/30/2024 4. Migraines/ Frontal Headache: On Maxalt . Keep a headache Journal. . Neurology Following. 06/30/2024 5. OSA : PCP Following. Continue to Monitor. 06/30/2024 6. Obesity: Dr Lorilee Following: Continue  Healthy Diet Regimen and Continue HEP as Tolerated. 06/30/2024 7. Status Post Cervical Spinal Fusion: C5C6- C-6- C-7 Anterior Cervical Discectomy Fusion and Allograft Plate: Ortho Following. 10/08//2025 8. Cervicalgia/ Cervical Radiculitis/ S/P Cervical Spinal Fusion:  Continue to Monitor Dr. Barbarann Following. 06/30/2024 9. Muscle Spasm: Continue Tizanidine  as needed.06/30/2024 10.Hereditary and Idiopathic Peripheral Neuropathy Continue with Tens Unit. 06/30/2024. 11.Left Greater Trochanteric Bursitis: . Continue to Alternate Ice and heat Therapy. Continue to Monitor. 06/30/2024.  12. Chronic Left  Knee Pain:  Ortho Following. Continue HEP as Tolerated. Continue to Monitor. 06/30/2024 13. Lateral Epicondylitis of Right Elbow: No complaints today.  Ortho Following. S/P   06/26/2020 Right lateral epicondyle debridement, drilling and repair  By Dr Barbarann. 14. Neuralgia/ Right  Facial Droop: Neurology following. Continue to monitor. 06/30/2024. 15. Polyarthralgia:  Continue HEP as tolerated. Continue to monitor. 06/30/2024  16. Paresthesia of Right Lower Extremity: No complaints today. Occasionally: Continue current medication regimen. Continue to monitor. 06/30/2024 17 . Right foot Pain/ Chronic Right Ankle Pain:  Podiatry following. Dr Regal.We will continue to Monitor. 06/30/2024 .    F/U in 1 month .

## 2024-07-02 ENCOUNTER — Ambulatory Visit: Admitting: Registered Nurse

## 2024-07-02 ENCOUNTER — Other Ambulatory Visit: Payer: Self-pay

## 2024-07-05 ENCOUNTER — Other Ambulatory Visit: Payer: Self-pay

## 2024-07-05 DIAGNOSIS — M9261 Juvenile osteochondrosis of tarsus, right ankle: Secondary | ICD-10-CM | POA: Diagnosis not present

## 2024-07-05 DIAGNOSIS — M898X7 Other specified disorders of bone, ankle and foot: Secondary | ICD-10-CM | POA: Diagnosis not present

## 2024-07-05 DIAGNOSIS — M7661 Achilles tendinitis, right leg: Secondary | ICD-10-CM | POA: Diagnosis not present

## 2024-07-05 DIAGNOSIS — G8918 Other acute postprocedural pain: Secondary | ICD-10-CM | POA: Diagnosis not present

## 2024-07-05 MED ORDER — HYDROCODONE-ACETAMINOPHEN 5-325 MG PO TABS
1.0000 | ORAL_TABLET | Freq: Four times a day (QID) | ORAL | 0 refills | Status: DC | PRN
  Filled 2024-07-05: qty 25, 7d supply, fill #0

## 2024-07-06 ENCOUNTER — Other Ambulatory Visit: Payer: Self-pay

## 2024-07-07 ENCOUNTER — Other Ambulatory Visit (HOSPITAL_COMMUNITY): Payer: Self-pay

## 2024-07-13 ENCOUNTER — Ambulatory Visit (INDEPENDENT_AMBULATORY_CARE_PROVIDER_SITE_OTHER)

## 2024-07-13 DIAGNOSIS — M7661 Achilles tendinitis, right leg: Secondary | ICD-10-CM | POA: Diagnosis not present

## 2024-07-13 DIAGNOSIS — Z0279 Encounter for issue of other medical certificate: Secondary | ICD-10-CM

## 2024-07-13 NOTE — Progress Notes (Unsigned)
 Subjective:  Patient ID: Karina Greer, female    DOB: 04-05-1966,  MRN: [id number]  No chief complaint on file.   DOS: 07/05/24 Procedure: Right leg calcaneal ostectomy, secondary achilles debridement  58 y.o. female returns for post-op check. ***  Review of Systems: Negative except as noted in the HPI. Denies N/V/F/Ch.  Past Medical History:  Diagnosis Date   Anxiety    Anxiety disorder    Arthritis    Blepharitis    Chronic low back pain    Chronic pain    Depression    Diabetes mellitus without complication (HCC)    Dyslipidemia    Family history of blood clots    Fibromyalgia    GERD (gastroesophageal reflux disease)    Headache    hx migraines   History of kidney stones    History of panic attacks    History of renal calculi    IBS (irritable bowel syndrome)    Interstitial cystitis    Lupus    tested positive for antibodies for lupus. 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    Current Outpatient Medications:    ACCU-CHEK GUIDE test strip, USE 1 (ONE) EACH DAILY FOR GLUCOSE MONITORING, Disp: , Rfl:    Accu-Chek Softclix Lancets lancets, SMARTSIG:1 stick Topical Daily, Disp: , Rfl:    albuterol  (PROVENTIL ) (2.5 MG/3ML) 0.083% nebulizer solution, USE 1 VIAL IN NEBULIZER EVERY 6 HOURS, Disp: 120 mL, Rfl: 11   albuterol  (VENTOLIN  HFA) 108 (90 Base) MCG/ACT inhaler, Inhale into the lungs., Disp: , Rfl:    alendronate (FOSAMAX) 70 MG tablet, Take 70 mg by mouth once a week., Disp: , Rfl:    ammonium lactate  (LAC-HYDRIN ) 12 % lotion, APPLY 1 APPLICATION TOPICALLY AS NEEDED FOR DRY SKIN., Disp: 400 mL, Rfl: 0   b  complex vitamins tablet, Take 1 tablet by mouth daily., Disp: , Rfl:    chlorhexidine  (PERIDEX ) 0.12 % solution, SMARTSIG:By Mouth, Disp: , Rfl:    Cholecalciferol (VITAMIN D ) 50 MCG (2000 UT) CAPS, Take 1 capsule by mouth once a week., Disp: , Rfl:    dexlansoprazole  (DEXILANT ) 60 MG capsule, Take 1 capsule (60 mg total) by mouth daily., Disp: 90 capsule, Rfl: 0   dicyclomine  (BENTYL ) 10 MG capsule, Take 10 mg by mouth 2 (two) times daily as needed., Disp: , Rfl:    EPINEPHrine  (EPIPEN  2-PAK) 0.3 mg/0.3 mL IJ SOAJ injection, Use as directed, Disp: 1 each, Rfl: 1   fexofenadine (ALLEGRA) 180 MG tablet, Take 180 mg by mouth daily., Disp: , Rfl:    fluticasone  (FLOVENT  HFA) 110 MCG/ACT inhaler, TAKE 1 PUFF BY MOUTH TWICE A DAY, Disp: 12 each, Rfl: 5   HYDROcodone -acetaminophen  (NORCO) 10-325 MG tablet, Take 1 tablet by mouth every 8 (eight) hours as needed., Disp: 90 tablet, Rfl: 0   hydrocortisone valerate cream (WESTCORT) 0.2 %, Apply topically 2 (two) times daily., Disp: , Rfl:    hydrOXYzine  (ATARAX ) 25 MG tablet, Take 1 tablet (25 mg total) by mouth every 8 (  eight) hours as needed., Disp: 30 tablet, Rfl: 3   ketoconazole  (NIZORAL ) 2 % shampoo, Apply 1 application topically 2 (two) times a week. , Disp: , Rfl:    KRILL OIL PO, Take by mouth., Disp: , Rfl:    lidocaine  (LIDODERM ) 5 %, Place 1 patch onto the skin daily. Remove & Discard patch within 12 hours or as directed by MD, Disp: 90 patch, Rfl: 3   magnesium oxide (MAG-OX) 400 MG tablet, Take by mouth., Disp: , Rfl:    meclizine  (ANTIVERT ) 25 MG tablet, Take 1 tablet (25 mg total) by mouth 3 (three) times daily as needed for dizziness., Disp: 30 tablet, Rfl: 1   nortriptyline  (PAMELOR ) 10 MG capsule, Take 1 capsule (10 mg total) by mouth at bedtime for 7 days, THEN 2 capsules (20 mg total) at bedtime., Disp: 60 capsule, Rfl: 3   ondansetron  (ZOFRAN -ODT) 8 MG disintegrating tablet, Dissolve 1 tablet (8 mg total) by mouth 3 (three) times daily  as needed for nausea/ dizziness., Disp: 30 tablet, Rfl: 5   SPIRULINA PO, Take 1 capsule by mouth daily., Disp: , Rfl:    tiZANidine  (ZANAFLEX ) 2 MG tablet, Take 1 tablet (2 mg total) by mouth every 8 (eight) hours as needed., Disp: 50 tablet, Rfl: 2   valACYclovir (VALTREX) 1000 MG tablet, Take by mouth., Disp: , Rfl:    Vitamin D , Ergocalciferol , (DRISDOL ) 1.25 MG (50000 UNIT) CAPS capsule, Take 50,000 Units by mouth 2 (two) times a week., Disp: , Rfl:   Social History   Tobacco Use  Smoking Status Never  Smokeless Tobacco Never    Allergies  Allergen Reactions   Alendronate Sodium Anaphylaxis    Closes throat up   Doxycycline Other (See Comments)   Gadolinium Derivatives Hives, Nausea And Vomiting, Nausea Only and Rash    Other Reaction(s): GI Intolerance   Iodinated Contrast Media Hives and Swelling   Milnacipran Hcl Other (See Comments)    Makes patient bleed from everywhere.   Elavil [Amitriptyline Hcl] Other (See Comments)    Hallucinations    Tramadol Palpitations and Other (See Comments)    Tachycardia    Atorvastatin    Carbapenems Other (See Comments)    Felt like death Felt like death   Clonazepam    Codeine Sulfate Other (See Comments)   Cymbalta [Duloxetine Hcl] Other (See Comments)    Cannot remember    Duloxetine Other (See Comments)    Facial swelling   Ivermectin     Also Ivermectin  Other Reaction(s): Other (See Comments)   Lamotrigine     Methocarbamol     Morphine Other (See Comments)    Caused anxiety attack pt felt like she was going to have a heart attack Tachycardia per pt   Nsaids Other (See Comments)    Upset stomach Upset stomach   Other Other (See Comments)    bandaid bandaid bandaid bandaid   Ropinirole     Other reaction(s): Other (See Comments) Patient doesn't remember   Skelaxin Other (See Comments)   Sulfamethoxazole    Tessalon [Benzonatate] Other (See Comments)    FEELS LIKE I'M CHOKING   Tolmetin    Vilazodone     Diphenhydramine  Hcl Anxiety   Gabapentin  Nausea Only    Pt was unable to get out of bed, increased appetite  Pt was unable to get out of bed, increased appetite  Pt was unable to get out of bed, increased appetite    Oxycodone Itching and Rash    SEVERE  ITCHING   Oxycodone-Acetaminophen  Palpitations   Tetracycline Rash   Objective:  There were no vitals filed for this visit. There is no height or weight on file to calculate BMI. Constitutional Well developed. Well nourished.  Vascular Foot warm and well perfused. Capillary refill normal to all digits.   Neurologic Normal speech. Oriented to person, place, and time. Epicritic sensation to light touch grossly present bilaterally.  Dermatologic Skin healing well without signs of infection. Skin edges well coapted without signs of infection. Mild edema around surgical site.   Orthopedic: Mild tenderness to palpation noted about the surgical site within normal limits.   Radiographs: *** Assessment:   1. Achilles tendinitis, right leg    Plan:  Patient was evaluated and treated and all questions answered. Doing well in post operative recovery.   S/p {R/L/Bi:18860} {arsurgfootankle:33543} surgery  -Progressing as expected post-operatively. -XR: *** -WB Status: {ARweightbearingstatus:33540} in {ARpostopsplintboot:33541}. -Sutures: {arsutures:33542}. -Medications: *** -Foot redressed. -XR at next visit: {arXRoptions:33544}  No follow-ups on file.

## 2024-07-14 ENCOUNTER — Telehealth: Payer: Self-pay

## 2024-07-14 NOTE — Telephone Encounter (Signed)
 Pt paid 25 for forms for leave for her son Fonda MATSU to be out to asst her. Will email to [email or web address]   RTW 08/03/24

## 2024-07-20 ENCOUNTER — Other Ambulatory Visit (HOSPITAL_BASED_OUTPATIENT_CLINIC_OR_DEPARTMENT_OTHER): Payer: Self-pay

## 2024-07-26 ENCOUNTER — Encounter: Payer: Self-pay | Admitting: Radiology

## 2024-07-29 ENCOUNTER — Telehealth: Payer: Self-pay

## 2024-07-29 ENCOUNTER — Encounter: Admitting: Registered Nurse

## 2024-07-29 ENCOUNTER — Other Ambulatory Visit: Payer: Self-pay

## 2024-07-29 ENCOUNTER — Telehealth: Payer: Self-pay | Admitting: Registered Nurse

## 2024-07-29 DIAGNOSIS — G8929 Other chronic pain: Secondary | ICD-10-CM

## 2024-07-29 DIAGNOSIS — M542 Cervicalgia: Secondary | ICD-10-CM

## 2024-07-29 DIAGNOSIS — G894 Chronic pain syndrome: Secondary | ICD-10-CM

## 2024-07-29 MED ORDER — HYDROCODONE-ACETAMINOPHEN 10-325 MG PO TABS
1.0000 | ORAL_TABLET | Freq: Three times a day (TID) | ORAL | 0 refills | Status: DC | PRN
  Filled 2024-07-29 – 2024-08-02 (×2): qty 90, 30d supply, fill #0

## 2024-07-29 NOTE — Telephone Encounter (Signed)
 PMP was revied.  Hydrocodone  e-scribed to pharmacy.  Karina Greer is aware of the above.

## 2024-07-29 NOTE — Telephone Encounter (Signed)
 Patient will need a refill on Hydrocodone  for November.  Patient is s/p foot surgery and due to new Medicare changes, video/phone visits are no longer covered by [employer].  Appointment has been cxl'd for today and patient will call the office around 08/30/24 for refill and follow up in office on 09/14/24 at 11:00 with [facility].

## 2024-07-30 ENCOUNTER — Other Ambulatory Visit: Payer: Self-pay

## 2024-07-30 MED ORDER — MECLIZINE HCL 25 MG PO TABS
25.0000 mg | ORAL_TABLET | Freq: Three times a day (TID) | ORAL | 2 refills | Status: DC | PRN
  Filled 2024-07-30: qty 30, 10d supply, fill #0
  Filled 2024-08-26: qty 30, 10d supply, fill #1
  Filled 2024-09-27: qty 30, 10d supply, fill #2

## 2024-08-02 ENCOUNTER — Other Ambulatory Visit: Payer: Self-pay

## 2024-08-03 ENCOUNTER — Ambulatory Visit (INDEPENDENT_AMBULATORY_CARE_PROVIDER_SITE_OTHER)

## 2024-08-03 DIAGNOSIS — M7661 Achilles tendinitis, right leg: Secondary | ICD-10-CM

## 2024-08-03 DIAGNOSIS — M9261 Juvenile osteochondrosis of tarsus, right ankle: Secondary | ICD-10-CM

## 2024-08-03 DIAGNOSIS — Z9889 Other specified postprocedural states: Secondary | ICD-10-CM

## 2024-08-04 NOTE — Progress Notes (Signed)
 Subjective:  Patient ID: Karina Greer, female    DOB: 1966/04/04,  MRN: [id number]  Chief Complaint  Patient presents with   Routine Post Op    Rm9 poV2 DOS 07/05/2024 RT Gastrocnemius recession, rt calcaneal osteotomy, RT secondary achilles tendon repair, pt says she is having pain in toes.    DOS: 07/05/24 Procedure: Right leg calcaneal ostectomy, secondary achilles debridement  58 y.o. female returns for post-op check.  She is doing quite well in her recovery and states that her pain has been well-controlled. She does still experience intermittent burning.  She has been compliant with nonweightbearing and elevating the leg.  Denies any complication.  Review of Systems: Negative except as noted in the HPI. Denies N/V/F/Ch.  Past Medical History:  Diagnosis Date   Anxiety    Anxiety disorder    Arthritis    Blepharitis    Chronic low back pain    Chronic pain    Depression    Diabetes mellitus without complication (HCC)    Dyslipidemia    Family history of blood clots    Fibromyalgia    GERD (gastroesophageal reflux disease)    Headache    hx migraines   History of kidney stones    History of panic attacks    History of renal calculi    IBS (irritable bowel syndrome)    Interstitial cystitis    Lupus    tested positive for antibodies for lupus. 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    Current Outpatient Medications:    ACCU-CHEK GUIDE test strip, USE 1 (ONE) EACH DAILY FOR GLUCOSE MONITORING, Disp: , Rfl:    Accu-Chek Softclix Lancets lancets, SMARTSIG:1 stick Topical Daily, Disp: , Rfl:    albuterol   (PROVENTIL ) (2.5 MG/3ML) 0.083% nebulizer solution, USE 1 VIAL IN NEBULIZER EVERY 6 HOURS, Disp: 120 mL, Rfl: 11   albuterol  (VENTOLIN  HFA) 108 (90 Base) MCG/ACT inhaler, Inhale into the lungs., Disp: , Rfl:    alendronate (FOSAMAX) 70 MG tablet, Take 70 mg by mouth once a week., Disp: , Rfl:    ammonium lactate  (LAC-HYDRIN ) 12 % lotion, APPLY 1 APPLICATION TOPICALLY AS NEEDED FOR DRY SKIN., Disp: 400 mL, Rfl: 0   b complex vitamins tablet, Take 1 tablet by mouth daily., Disp: , Rfl:    chlorhexidine  (PERIDEX ) 0.12 % solution, SMARTSIG:By Mouth, Disp: , Rfl:    Cholecalciferol (VITAMIN D ) 50 MCG (2000 UT) CAPS, Take 1 capsule by mouth once a week., Disp: , Rfl:    dexlansoprazole  (DEXILANT ) 60 MG capsule, Take 1 capsule (60 mg total) by mouth daily., Disp: 90 capsule, Rfl: 0   dicyclomine  (BENTYL ) 10 MG capsule, Take 10 mg by mouth 2 (two) times daily as needed., Disp: , Rfl:    EPINEPHrine  (EPIPEN  2-PAK) 0.3 mg/0.3 mL IJ SOAJ injection, Use as directed, Disp: 1 each, Rfl: 1   fexofenadine (ALLEGRA) 180 MG tablet, Take 180 mg by mouth daily., Disp: , Rfl:    fluticasone  (FLOVENT  HFA) 110 MCG/ACT inhaler, TAKE  1 PUFF BY MOUTH TWICE A DAY, Disp: 12 each, Rfl: 5   HYDROcodone -acetaminophen  (NORCO) 10-325 MG tablet, Take 1 tablet by mouth every 8 (eight) hours as needed., Disp: 90 tablet, Rfl: 0   hydrocortisone valerate cream (WESTCORT) 0.2 %, Apply topically 2 (two) times daily., Disp: , Rfl:    hydrOXYzine  (ATARAX ) 25 MG tablet, Take 1 tablet (25 mg total) by mouth every 8 (eight) hours as needed., Disp: 30 tablet, Rfl: 3   ketoconazole  (NIZORAL ) 2 % shampoo, Apply 1 application topically 2 (two) times a week. , Disp: , Rfl:    KRILL OIL PO, Take by mouth., Disp: , Rfl:    lidocaine  (LIDODERM ) 5 %, Place 1 patch onto the skin daily. Remove & Discard patch within 12 hours or as directed by MD, Disp: 90 patch, Rfl: 3   magnesium oxide (MAG-OX) 400 MG tablet, Take by mouth., Disp: , Rfl:    meclizine   (ANTIVERT ) 25 MG tablet, Take 1 tablet (25 mg total) by mouth 3 (three) times daily as needed for dizziness., Disp: 30 tablet, Rfl: 2   nortriptyline  (PAMELOR ) 10 MG capsule, Take 1 capsule (10 mg total) by mouth at bedtime for 7 days, THEN 2 capsules (20 mg total) at bedtime., Disp: 60 capsule, Rfl: 3   ondansetron  (ZOFRAN -ODT) 8 MG disintegrating tablet, Dissolve 1 tablet (8 mg total) by mouth 3 (three) times daily as needed for nausea/ dizziness., Disp: 30 tablet, Rfl: 5   SPIRULINA PO, Take 1 capsule by mouth daily., Disp: , Rfl:    tiZANidine  (ZANAFLEX ) 2 MG tablet, Take 1 tablet (2 mg total) by mouth every 8 (eight) hours as needed., Disp: 50 tablet, Rfl: 2   valACYclovir (VALTREX) 1000 MG tablet, Take by mouth., Disp: , Rfl:    Vitamin D , Ergocalciferol , (DRISDOL ) 1.25 MG (50000 UNIT) CAPS capsule, Take 50,000 Units by mouth 2 (two) times a week., Disp: , Rfl:   Social History   Tobacco Use  Smoking Status Never  Smokeless Tobacco Never    Allergies  Allergen Reactions   Alendronate Sodium Anaphylaxis    Closes throat up   Doxycycline Other (See Comments)   Gadolinium Derivatives Hives, Nausea And Vomiting, Nausea Only and Rash    Other Reaction(s): GI Intolerance   Iodinated Contrast Media Hives and Swelling   Milnacipran Hcl Other (See Comments)    Makes patient bleed from everywhere.   Elavil [Amitriptyline Hcl] Other (See Comments)    Hallucinations    Tramadol Palpitations and Other (See Comments)    Tachycardia    Atorvastatin    Carbapenems Other (See Comments)    Felt like death Felt like death   Clonazepam    Codeine Sulfate Other (See Comments)   Cymbalta [Duloxetine Hcl] Other (See Comments)    Cannot remember    Duloxetine Other (See Comments)    Facial swelling   Ivermectin     Also Ivermectin  Other Reaction(s): Other (See Comments)   Lamotrigine     Methocarbamol     Morphine Other (See Comments)    Caused anxiety attack pt felt like she was  going to have a heart attack Tachycardia per pt   Nsaids Other (See Comments)    Upset stomach Upset stomach   Other Other (See Comments)    bandaid bandaid bandaid bandaid   Ropinirole     Other reaction(s): Other (See Comments) Patient doesn't remember   Skelaxin Other (See Comments)   Sulfamethoxazole    Tessalon [Benzonatate] Other (See Comments)  FEELS LIKE I'M CHOKING   Tolmetin    Vilazodone    Diphenhydramine  Hcl Anxiety   Gabapentin  Nausea Only    Pt was unable to get out of bed, increased appetite  Pt was unable to get out of bed, increased appetite  Pt was unable to get out of bed, increased appetite    Oxycodone Itching and Rash    SEVERE ITCHING   Oxycodone-Acetaminophen  Palpitations   Tetracycline Rash   Objective:  There were no vitals filed for this visit. There is no height or weight on file to calculate BMI. Constitutional Well developed. Well nourished.  Vascular Foot warm and well perfused. Capillary refill normal to all digits.   Neurologic Normal speech. Oriented to person, place, and time. Epicritic sensation to light touch grossly present bilaterally.  Dermatologic Skin healing well without signs of infection. Skin edges well coapted without signs of infection. Mild edema around surgical site. Sutures intact. No dehiscence or gapping following suture removal.   Orthopedic: Mild tenderness to palpation noted about the surgical site within normal limits.   Assessment:   1. Achilles tendinitis, right leg   2. Haglund's deformity, right   3. Status post right foot surgery     Plan:  Patient was evaluated and treated and all questions answered. Doing well in post operative recovery.   S/p Right ankle surgery  -Progressing as expected post-operatively. -XR: Not needed today -WB Status: Patient dispensed CAM walker with 4 heel wedges. She is to begin transition to WBAT, 25% body weight first week, 50% next, 75% following, transition to 100.  She will remove 1 heel wedge/week, getting to neutral at 8 weeks. Plan for PT beginning next visit.  -Sutures: removed today without incident. Steri strips applied. -Medications: No longer requires aspirin -Foot redressed. -XR at next visit: 3 views foot plus calc axial  RTC 4 weeks for repeat evaluation with pre-clinical XR  Prentice Ovens, DPM

## 2024-08-12 ENCOUNTER — Telehealth: Payer: Self-pay

## 2024-08-12 MED ORDER — AMOXICILLIN-POT CLAVULANATE 875-125 MG PO TABS: 1.0000 | ORAL_TABLET | Freq: Two times a day (BID) | ORAL | 0 refills | Status: AC

## 2024-08-12 NOTE — Telephone Encounter (Signed)
 I spoke with patient and she is aware that medication has been sent to pharmacy and to make a sooner appointment if it doesn't improve.

## 2024-08-12 NOTE — Telephone Encounter (Signed)
 Patient called stating that she had taking the dressing off of and she had white pus and oozing.She states that the drainage is pinkish in color,but clear.  She states it's the area toward the heel. No pain or noticeable swelling. Drainage is not soaking the gauze.She didn't know if she needed to put antibiotic ointment on the area or if she may need an antibiotic. She will try to send pictures via mychart.

## 2024-08-12 NOTE — Telephone Encounter (Signed)
 Patient contacted our office stating she is having some oozing from her incision. Ordered augmentin  BID 875/125 x 7 days out of precaution. She is to keep the area dressed and clean. We will see her in clinic when possible.   Prentice Ovens, DPM

## 2024-08-26 ENCOUNTER — Other Ambulatory Visit: Payer: Self-pay

## 2024-08-26 MED ORDER — DEXLANSOPRAZOLE 60 MG PO CPDR
60.0000 mg | DELAYED_RELEASE_CAPSULE | Freq: Every day | ORAL | 0 refills | Status: AC
  Filled 2024-08-26: qty 90, 90d supply, fill #0

## 2024-08-27 ENCOUNTER — Telehealth: Payer: Self-pay

## 2024-08-27 ENCOUNTER — Other Ambulatory Visit: Payer: Self-pay

## 2024-08-27 ENCOUNTER — Other Ambulatory Visit: Payer: Self-pay | Admitting: Physical Medicine and Rehabilitation

## 2024-08-27 DIAGNOSIS — M542 Cervicalgia: Secondary | ICD-10-CM

## 2024-08-27 DIAGNOSIS — G8929 Other chronic pain: Secondary | ICD-10-CM

## 2024-08-27 DIAGNOSIS — G894 Chronic pain syndrome: Secondary | ICD-10-CM

## 2024-08-27 MED ORDER — HYDROCODONE-ACETAMINOPHEN 10-325 MG PO TABS
1.0000 | ORAL_TABLET | Freq: Three times a day (TID) | ORAL | 0 refills | Status: DC | PRN
  Filled 2024-08-27: qty 90, 30d supply, fill #0

## 2024-08-27 NOTE — Telephone Encounter (Signed)
 Patient calling into the office requesting a refill on Hydrocodone .  Patient states Fidela advised patient to call the office 3 days prior to date she will be out of medication.  Patient states she has enough to get her to Tuesday 08/31/24.  Patient advised that Fidela is not in the office and I will send the request to another Provider.  Patient verbalized understanding.

## 2024-08-28 ENCOUNTER — Other Ambulatory Visit: Payer: Self-pay

## 2024-08-30 ENCOUNTER — Other Ambulatory Visit: Payer: Self-pay

## 2024-08-30 ENCOUNTER — Other Ambulatory Visit (HOSPITAL_COMMUNITY): Payer: Self-pay

## 2024-09-01 ENCOUNTER — Other Ambulatory Visit (HOSPITAL_BASED_OUTPATIENT_CLINIC_OR_DEPARTMENT_OTHER): Payer: Self-pay

## 2024-09-02 ENCOUNTER — Other Ambulatory Visit: Payer: Self-pay

## 2024-09-03 ENCOUNTER — Ambulatory Visit

## 2024-09-03 DIAGNOSIS — M7661 Achilles tendinitis, right leg: Secondary | ICD-10-CM

## 2024-09-03 DIAGNOSIS — Z9889 Other specified postprocedural states: Secondary | ICD-10-CM

## 2024-09-03 DIAGNOSIS — M9261 Juvenile osteochondrosis of tarsus, right ankle: Secondary | ICD-10-CM

## 2024-09-03 NOTE — Progress Notes (Unsigned)
 Subjective:  Patient ID: Karina Greer, female    DOB: 06-19-1966,  MRN: [id number]  Chief Complaint  Patient presents with   Routine Post Op    Rm16 Post op right foot / pt says she has some tenderness when walking on foot    DOS: 07/05/24 Procedure: Right leg calcaneal ostectomy, secondary achilles debridement  58 y.o. female returns for post-op check.  She is doing quite well in her recovery and states that her pain has been well-controlled. She has been compliant with weightbearing as tolerated in cam walker and elevating the leg.  She has removed the heel wedges and is doing well with it.  She completed the oral Augmentin  that was prescribed and states the incision is doing well.  Denies any complication.  Review of Systems: Negative except as noted in the HPI. Denies N/V/F/Ch.  Past Medical History:  Diagnosis Date   Anxiety    Anxiety disorder    Arthritis    Blepharitis    Chronic low back pain    Chronic pain    Depression    Diabetes mellitus without complication (HCC)    Dyslipidemia    Family history of blood clots    Fibromyalgia    GERD (gastroesophageal reflux disease)    Headache    hx migraines   History of kidney stones    History of panic attacks    History of renal calculi    IBS (irritable bowel syndrome)    Interstitial cystitis    Lupus    tested positive for antibodies for lupus. 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    Current Outpatient Medications:    ACCU-CHEK GUIDE test strip, USE 1 (ONE) EACH DAILY FOR GLUCOSE MONITORING, Disp: , Rfl:    Accu-Chek Softclix Lancets lancets,  SMARTSIG:1 stick Topical Daily, Disp: , Rfl:    albuterol  (PROVENTIL ) (2.5 MG/3ML) 0.083% nebulizer solution, USE 1 VIAL IN NEBULIZER EVERY 6 HOURS, Disp: 120 mL, Rfl: 11   albuterol  (VENTOLIN  HFA) 108 (90 Base) MCG/ACT inhaler, Inhale into the lungs., Disp: , Rfl:    alendronate (FOSAMAX) 70 MG tablet, Take 70 mg by mouth once a week., Disp: , Rfl:    ammonium lactate  (LAC-HYDRIN ) 12 % lotion, APPLY 1 APPLICATION TOPICALLY AS NEEDED FOR DRY SKIN., Disp: 400 mL, Rfl: 0   amoxicillin -clavulanate (AUGMENTIN ) 875-125 MG tablet, Take 1 tablet by mouth 2 (two) times daily., Disp: 14 tablet, Rfl: 0   b complex vitamins tablet, Take 1 tablet by mouth daily., Disp: , Rfl:    chlorhexidine  (PERIDEX ) 0.12 % solution, SMARTSIG:By Mouth, Disp: , Rfl:    Cholecalciferol (VITAMIN D ) 50 MCG (2000 UT) CAPS, Take 1 capsule by mouth once a week., Disp: , Rfl:    dexlansoprazole  (DEXILANT ) 60 MG capsule, Take 1 capsule (60 mg total) by mouth daily., Disp: 90 capsule, Rfl: 0   dexlansoprazole  (DEXILANT ) 60 MG capsule, Take 1 capsule (60 mg total) by mouth daily., Disp: 90 capsule, Rfl: 0  dicyclomine  (BENTYL ) 10 MG capsule, Take 10 mg by mouth 2 (two) times daily as needed., Disp: , Rfl:    EPINEPHrine  (EPIPEN  2-PAK) 0.3 mg/0.3 mL IJ SOAJ injection, Use as directed, Disp: 1 each, Rfl: 1   fexofenadine (ALLEGRA) 180 MG tablet, Take 180 mg by mouth daily., Disp: , Rfl:    fluticasone  (FLOVENT  HFA) 110 MCG/ACT inhaler, TAKE 1 PUFF BY MOUTH TWICE A DAY, Disp: 12 each, Rfl: 5   HYDROcodone -acetaminophen  (NORCO) 10-325 MG tablet, Take 1 tablet by mouth every 8 (eight) hours as needed., Disp: 90 tablet, Rfl: 0   hydrocortisone valerate cream (WESTCORT) 0.2 %, Apply topically 2 (two) times daily., Disp: , Rfl:    hydrOXYzine  (ATARAX ) 25 MG tablet, Take 1 tablet (25 mg total) by mouth every 8 (eight) hours as needed., Disp: 30 tablet, Rfl: 3   ketoconazole  (NIZORAL ) 2 % shampoo, Apply 1 application topically 2 (two) times a  week. , Disp: , Rfl:    KRILL OIL PO, Take by mouth., Disp: , Rfl:    lidocaine  (LIDODERM ) 5 %, Place 1 patch onto the skin daily. Remove & Discard patch within 12 hours or as directed by MD, Disp: 90 patch, Rfl: 3   magnesium oxide (MAG-OX) 400 MG tablet, Take by mouth., Disp: , Rfl:    meclizine  (ANTIVERT ) 25 MG tablet, Take 1 tablet (25 mg total) by mouth 3 (three) times daily as needed for dizziness., Disp: 30 tablet, Rfl: 2   nortriptyline  (PAMELOR ) 10 MG capsule, Take 1 capsule (10 mg total) by mouth at bedtime for 7 days, THEN 2 capsules (20 mg total) at bedtime., Disp: 60 capsule, Rfl: 3   ondansetron  (ZOFRAN -ODT) 8 MG disintegrating tablet, Dissolve 1 tablet (8 mg total) by mouth 3 (three) times daily as needed for nausea/ dizziness., Disp: 30 tablet, Rfl: 5   SPIRULINA PO, Take 1 capsule by mouth daily., Disp: , Rfl:    tiZANidine  (ZANAFLEX ) 2 MG tablet, Take 1 tablet (2 mg total) by mouth every 8 (eight) hours as needed., Disp: 50 tablet, Rfl: 2   valACYclovir (VALTREX) 1000 MG tablet, Take by mouth., Disp: , Rfl:    Vitamin D , Ergocalciferol , (DRISDOL ) 1.25 MG (50000 UNIT) CAPS capsule, Take 50,000 Units by mouth 2 (two) times a week., Disp: , Rfl:   Social History   Tobacco Use  Smoking Status Never  Smokeless Tobacco Never    Allergies  Allergen Reactions   Alendronate Sodium Anaphylaxis    Closes throat up   Doxycycline Other (See Comments)   Gadolinium Derivatives Hives, Nausea And Vomiting, Nausea Only and Rash    Other Reaction(s): GI Intolerance   Iodinated Contrast Media Hives and Swelling   Milnacipran Hcl Other (See Comments)    Makes patient bleed from everywhere.   Elavil [Amitriptyline Hcl] Other (See Comments)    Hallucinations    Tramadol Palpitations and Other (See Comments)    Tachycardia    Atorvastatin    Carbapenems Other (See Comments)    Felt like death Felt like death   Clonazepam    Codeine Sulfate Other (See Comments)   Cymbalta  [Duloxetine Hcl] Other (See Comments)    Cannot remember    Duloxetine Other (See Comments)    Facial swelling   Ivermectin     Also Ivermectin  Other Reaction(s): Other (See Comments)   Lamotrigine     Methocarbamol     Morphine Other (See Comments)    Caused anxiety attack pt felt like she was going to have  a heart attack Tachycardia per pt   Nsaids Other (See Comments)    Upset stomach Upset stomach   Other Other (See Comments)    bandaid bandaid bandaid bandaid   Ropinirole     Other reaction(s): Other (See Comments) Patient doesn't remember   Skelaxin Other (See Comments)   Sulfamethoxazole    Tessalon [Benzonatate] Other (See Comments)    FEELS LIKE I'M CHOKING   Tolmetin    Vilazodone    Diphenhydramine  Hcl Anxiety   Gabapentin  Nausea Only    Pt was unable to get out of bed, increased appetite  Pt was unable to get out of bed, increased appetite  Pt was unable to get out of bed, increased appetite    Oxycodone Itching and Rash    SEVERE ITCHING   Oxycodone-Acetaminophen  Palpitations   Tetracycline Rash   Objective:  There were no vitals filed for this visit. There is no height or weight on file to calculate BMI. Constitutional Well developed. Well nourished.  Vascular Foot warm and well perfused. Capillary refill normal to all digits.   Neurologic Normal speech. Oriented to person, place, and time. Epicritic sensation to light touch grossly present bilaterally.  Dermatologic Skin healing well without signs of infection. Skin edges well healed without signs of infection. Mild edema around surgical site.  Pinpoint eschar at distal aspect of incision.  Otherwise incision very well-healed.  Orthopedic: Mild tenderness to palpation 2 to 3 cm proximal to the calcaneal insertion.  Patient does have strength to her Achilles tendon.   Radiographs: 3 views of the right foot were taken today.  These show appropriate resection of the Haglund's prominence off the  posterior superior calcaneus.  No intratendinous spur remaining.  No complications identified.  Assessment:   1. Achilles tendinitis, right leg   2. Haglund's deformity, right   3. Status post right foot surgery     Plan:  Patient was evaluated and treated and all questions answered. Doing well in post operative recovery.   S/p Right ankle surgery  -Progressing as expected post-operatively.  She is fully weightbearing in cam walker without wedges at this time. -XR: As above, as expected -WB Status: Patient can slowly begin transition out of cam walker into normal supportive tennis shoe.  She is not to stretch past 90 degrees of ankle dorsiflexion.  Prescription for physical therapy was placed, she has been to this physical therapist before and liked it.  She is to work on strengthening her Achilles, desensitization, transition to stable supportive tennis shoes.  Do not flex beyond 90 degrees ankle dorsiflexion for another 4 weeks. - Incision: Pinpoint eschar remaining, incision otherwise fully healed.  No complications.  RTC 6 weeks for repeat evaluation with pre-clinical XR  Prentice Ovens, DPM

## 2024-09-14 ENCOUNTER — Other Ambulatory Visit: Payer: Self-pay

## 2024-09-14 ENCOUNTER — Encounter: Attending: Physical Medicine & Rehabilitation | Admitting: Registered Nurse

## 2024-09-14 VITALS — BP 124/82 | HR 63 | Ht 63.0 in | Wt 222.0 lb

## 2024-09-14 DIAGNOSIS — M542 Cervicalgia: Secondary | ICD-10-CM | POA: Insufficient documentation

## 2024-09-14 DIAGNOSIS — G894 Chronic pain syndrome: Secondary | ICD-10-CM | POA: Insufficient documentation

## 2024-09-14 DIAGNOSIS — M25562 Pain in left knee: Secondary | ICD-10-CM | POA: Insufficient documentation

## 2024-09-14 DIAGNOSIS — M79671 Pain in right foot: Secondary | ICD-10-CM | POA: Insufficient documentation

## 2024-09-14 DIAGNOSIS — Z79899 Other long term (current) drug therapy: Secondary | ICD-10-CM | POA: Diagnosis present

## 2024-09-14 DIAGNOSIS — M797 Fibromyalgia: Secondary | ICD-10-CM | POA: Diagnosis present

## 2024-09-14 DIAGNOSIS — M7061 Trochanteric bursitis, right hip: Secondary | ICD-10-CM | POA: Insufficient documentation

## 2024-09-14 DIAGNOSIS — M546 Pain in thoracic spine: Secondary | ICD-10-CM | POA: Diagnosis not present

## 2024-09-14 DIAGNOSIS — M25561 Pain in right knee: Secondary | ICD-10-CM | POA: Diagnosis present

## 2024-09-14 DIAGNOSIS — M545 Low back pain, unspecified: Secondary | ICD-10-CM | POA: Diagnosis present

## 2024-09-14 DIAGNOSIS — G8929 Other chronic pain: Secondary | ICD-10-CM | POA: Insufficient documentation

## 2024-09-14 DIAGNOSIS — M7062 Trochanteric bursitis, left hip: Secondary | ICD-10-CM | POA: Insufficient documentation

## 2024-09-14 DIAGNOSIS — Z5181 Encounter for therapeutic drug level monitoring: Secondary | ICD-10-CM | POA: Insufficient documentation

## 2024-09-14 DIAGNOSIS — M5412 Radiculopathy, cervical region: Secondary | ICD-10-CM | POA: Insufficient documentation

## 2024-09-14 MED ORDER — HYDROCODONE-ACETAMINOPHEN 10-325 MG PO TABS
1.0000 | ORAL_TABLET | Freq: Three times a day (TID) | ORAL | 0 refills | Status: DC | PRN
  Filled 2024-09-14 – 2024-09-27 (×2): qty 90, 30d supply, fill #0

## 2024-09-14 NOTE — Progress Notes (Signed)
 "  Subjective:    Patient ID: Karina Greer, female    DOB: [birth year], 58 y.o.   MRN: [id number]  HPI: Karina Greer is a 58 y.o. female who returns for follow up appointment for chronic pain and medication refill. She states her pain is located in her neck radiating into her right shoulder, mid- lower back, bilateral hips R>L and bilateral knee pain R>L . She rates her pain 7. Her current exercise regime is walking and performing stretching exercises.  Ms. Lennox Morphine equivalent is 30.00 MME.   Last Oral Swab was Performed on 05/05/2024, it was consistent.    Pain Inventory Average Pain 9 Pain Right Now 7 My pain is constant, sharp, burning, dull, stabbing, tingling, and aching  In the last 24 hours, has pain interfered with the following? General activity 7 Relation with others 7 Enjoyment of life 7 What TIME of day is your pain at its worst? morning , daytime, evening, and night Sleep (in general) Fair  Pain is worse with: walking, bending, sitting, inactivity, standing, unsure, and some activites Pain improves with: rest, heat/ice, therapy/exercise, pacing activities, medication, TENS, and injections Relief from Meds: 6  Family History  Problem Relation Age of Onset   Hypertension Mother    Diabetes Father    Brain cancer Father    Clotting disorder Father        blood clots   Fibromyalgia Sister    Suicidality Sister    Hypertension Other        Cancer, Cerebrovascular disease run on mother side of family   Social History   Socioeconomic History   Marital status: Divorced    Spouse name: Not on file   Number of children: 3   Years of education: HS   Highest education level: 12th grade  Occupational History   Occupation: Designer, Industrial/product: UNEMPLOYED    Comment: Disability  Tobacco Use   Smoking status: Never   Smokeless tobacco: Never  Vaping Use   Vaping status: Never Used  Substance and Sexual Activity   Alcohol  use: No   Drug use: No    Sexual activity: Not on file  Other Topics Concern   Not on file  Social History Narrative   Patient is right-handed. She avoids caffeine. She has recently been using the treadmill.   Social Drivers of Health   Tobacco Use: Low Risk (09/03/2024)   Patient History    Smoking Tobacco Use: Never    Smokeless Tobacco Use: Never    Passive Exposure: Not on file  Financial Resource Strain: Not on file  Food Insecurity: Not on file  Transportation Needs: Not on file  Physical Activity: Not on file  Stress: Not on file  Social Connections: Not on file  Depression (PHQ2-9): Low Risk (06/04/2024)   Depression (PHQ2-9)    PHQ-2 Score: 0  Alcohol  Screen: Low Risk (01/02/2022)   Alcohol  Screen    Last Alcohol  Screening Score (AUDIT): 0  Housing: Not on file  Utilities: Not on file  Health Literacy: Not on file   Past Surgical History:  Procedure Laterality Date   ACHILLES TENDON SURGERY Right 3/16   ANTERIOR CERVICAL DECOMP/DISCECTOMY FUSION N/A 03/11/2016   Procedure: C5-6, C6-7 Anterior Cervical Discectomy and Fusion, Allograft, Plate;  Surgeon: Oneil JAYSON Herald, MD;  Location: [facility] OR;  Service: Orthopedics;  Laterality: N/A;   CHOLECYSTECTOMY  2014   COLONOSCOPY WITH PROPOFOL  N/A 08/25/2017   Procedure: COLONOSCOPY WITH PROPOFOL ;  Surgeon: Viktoria,  Lamar DASEN, MD;  Location: [facility] ENDOSCOPY;  Service: Endoscopy;  Laterality: N/A;   CYSTO WITH HYDRODISTENSION N/A 01/26/2014   Procedure: CYSTOSCOPY/HYDRODISTENSION;  Surgeon: Alm GORMAN Fragmin, MD;  Location: [facility];  Service: Urology;  Laterality: N/A;   CYSTO/  URETHRAL DILATION/  HYDRODISTENTION/   INSTILLATION THERAPY  07-16-2010//   12-30-2007//   10-27-2006   ELBOW SURGERY Right    EXERCISE TOLERENCE TEST  10-12-2010   NEGATIVE  ADEQUATE ETT/  NO ISCHEMIA OR EVIDENCE HIGH GRADE OBSTRUCTIVE CAD/  NO FURTHER TEST NEEDED   HYSTEROSCOPY W/  NOVASURE ENDOMETRIAL ABLATION  2014   LAPAROSCOPIC OVARIAN CYST BX  2005   AND   URETEROSCOPIC LASER LITHO  STONE EXTRACTION   SHOULDER OPEN ROTATOR CUFF REPAIR Right 2004   TONSILLECTOMY     TRANSTHORACIC ECHOCARDIOGRAM  06-07-2006   normal study/  ef 60-65%   TUBAL LIGATION Bilateral 1995   Past Surgical History:  Procedure Laterality Date   ACHILLES TENDON SURGERY Right 3/16   ANTERIOR CERVICAL DECOMP/DISCECTOMY FUSION N/A 03/11/2016   Procedure: C5-6, C6-7 Anterior Cervical Discectomy and Fusion, Allograft, Plate;  Surgeon: Oneil JAYSON Herald, MD;  Location: [facility] OR;  Service: Orthopedics;  Laterality: N/A;   CHOLECYSTECTOMY  2014   COLONOSCOPY WITH PROPOFOL  N/A 08/25/2017   Procedure: COLONOSCOPY WITH PROPOFOL ;  Surgeon: Viktoria Lamar DASEN, MD;  Location: [facility] ENDOSCOPY;  Service: Endoscopy;  Laterality: N/A;   CYSTO WITH HYDRODISTENSION N/A 01/26/2014   Procedure: CYSTOSCOPY/HYDRODISTENSION;  Surgeon: Alm GORMAN Fragmin, MD;  Location: [facility];  Service: Urology;  Laterality: N/A;   CYSTO/  URETHRAL DILATION/  HYDRODISTENTION/   INSTILLATION THERAPY  07-16-2010//   12-30-2007//   10-27-2006   ELBOW SURGERY Right    EXERCISE TOLERENCE TEST  10-12-2010   NEGATIVE  ADEQUATE ETT/  NO ISCHEMIA OR EVIDENCE HIGH GRADE OBSTRUCTIVE CAD/  NO FURTHER TEST NEEDED   HYSTEROSCOPY W/  NOVASURE ENDOMETRIAL ABLATION  2014   LAPAROSCOPIC OVARIAN CYST BX  2005   AND  URETEROSCOPIC LASER LITHO  STONE EXTRACTION   SHOULDER OPEN ROTATOR CUFF REPAIR Right 2004   TONSILLECTOMY     TRANSTHORACIC ECHOCARDIOGRAM  06-07-2006   normal study/  ef 60-65%   TUBAL LIGATION Bilateral 1995   Past Medical History:  Diagnosis Date   Anxiety    Anxiety disorder    Arthritis    Blepharitis    Chronic low back pain    Chronic pain    Depression    Diabetes mellitus without complication (HCC)    Dyslipidemia    Family history of blood clots    Fibromyalgia    GERD (gastroesophageal reflux disease)    Headache    hx migraines   History of kidney stones    History of panic attacks     History of renal calculi    IBS (irritable bowel syndrome)    Interstitial cystitis    Lupus    tested positive for antibodies for lupus. dr to do further tests   Menorrhagia    OSA on CPAP    not used cpap for several weeks   Pneumonia    hx   PONV (postoperative nausea and vomiting)    RLS (restless legs syndrome)    Rosacea    Seasonal asthma    SI (sacroiliac) joint dysfunction    SUI (stress urinary incontinence, female)    UTI (lower urinary tract infection)    hx   White matter abnormality on MRI of  brain 02/23/2013   BP 124/82   Pulse 63   Ht 5' 3 (1.6 m)   Wt 222 lb (100.7 kg)   SpO2 96%   BMI 39.33 kg/m   Opioid Risk Score:   Fall Risk Score:  `1  Depression screen [facility] 2/9     06/04/2024    9:40 AM 04/08/2024    9:45 AM 02/09/2024    9:36 AM 12/11/2023   10:02 AM 07/11/2023    9:40 AM 07/04/2023    9:10 AM 06/26/2023   11:13 AM  Depression screen PHQ 2/9  Decreased Interest 0 0 0 0 0 0 0  Down, Depressed, Hopeless 0 0 0 0 0 0 0  PHQ - 2 Score 0 0 0 0 0 0 0  Altered sleeping   0      Tired, decreased energy   0      Change in appetite   1      Feeling bad or failure about yourself    0      Trouble concentrating   0      Moving slowly or fidgety/restless   0      Suicidal thoughts   0      PHQ-9 Score   1       Difficult doing work/chores   Somewhat difficult         Data saved with a previous flowsheet row definition      Review of Systems  Musculoskeletal:  Positive for myalgias.  All other systems reviewed and are negative.      Objective:   Physical Exam Vitals and nursing note reviewed.  Constitutional:      Appearance: Normal appearance.  Cardiovascular:     Rate and Karina: Normal rate and regular Karina.     Pulses: Normal pulses.     Heart sounds: Normal heart sounds.  Pulmonary:     Effort: Pulmonary effort is normal.     Breath sounds: Normal breath sounds.  Musculoskeletal:     Comments: Normal Muscle Bulk and Muscle  Testing Reveals:  Upper Extremities: Right: Decreased ROM 30 Degrees and Muscle Strength 5/5 Right AC Joint Tenderness Left Upper Extremity: Full ROM and Muscle Strength 5/5 Thoracic Paraspinal Tenderness: T-1-T-4  Lumbar Paraspinal Tenderness: L-4-L-5 Bilateral Greater Trochanter Tenderness Lower Extremities: Right: Decreased ROM and Muscle Strength 5/5 Right Lower Extremity Flexion Produces Pain into her Right hip and right Patella Wearing Right Cam-boot Left Lower Extremity: Full ROM and Muscle Strength 5/5 Arises from Chair slowly  Antalgic Gait     Skin:    General: Skin is warm and dry.  Neurological:     Mental Status: She is alert and oriented to person, place, and time.  Psychiatric:        Mood and Affect: Mood normal.        Behavior: Behavior normal.          Assessment & Plan:  1. Lumbago/ Lumbar Spondylosis/ Right  Lumbar Radiculitis: Continue to Monitor. 09/13/2024 Continue : HYDROcodone  10/325mg  one tablet every 8 hours as needed #90.  We will continue the opioid monitoring program, this consists of regular clinic visits, examinations, urine drug screen, pill counts as well as use of [place]  Controlled Substance Reporting system. A 12 month History has been reviewed on the [place]  Controlled Substance Reporting System on 09/13/2024. 2. Fibromyalgia. Continue Current exercise Regime. 09/13/2024 3. Anxiety and depression:Psychiatry Following: Counselor Jessica  at [facility] . Continue Counseling  at [facility]. 09/13/2024 4. Migraines/ Frontal Headache: On Maxalt . Keep a headache Journal. . Neurology Following. 09/13/2024 5. OSA : PCP Following. Continue to Monitor. 09/13/2024 6. Obesity: Dr Lorilee Following: Continue  Healthy Diet Regimen and Continue HEP as Tolerated. 09/13/2024 7. Status Post Cervical Spinal Fusion: C5C6- C-6- C-7 Anterior Cervical Discectomy Fusion and Allograft Plate: Ortho Following. 12/22//2025 8. Cervicalgia/  Cervical Radiculitis/ S/P Cervical Spinal Fusion:  Continue to Monitor Dr. Barbarann Following. 09/13/2024 9. Muscle Spasm: Continue Tizanidine  as needed.09/13/2024 10.Hereditary and Idiopathic Peripheral Neuropathy Continue with Tens Unit. 09/13/2024. 11.Left Greater Trochanteric Bursitis: . Continue to Alternate Ice and heat Therapy. Continue to Monitor. 09/13/2024. 12. Chronic Left  Knee Pain:  Ortho Following. Continue HEP as Tolerated. Continue to Monitor. 09/13/2024 13. Lateral Epicondylitis of Right Elbow: No complaints today.  Ortho Following. S/P   06/26/2020 Right lateral epicondyle debridement, drilling and repair  By Dr Barbarann. 14. Neuralgia/ Right Facial Droop: Neurology following. Continue to monitor. 09/13/2024. 15. Polyarthralgia:  Continue HEP as tolerated. Continue to monitor. 09/13/2024  16. Paresthesia of Right Lower Extremity: No complaints today. Occasionally: Continue current medication regimen. Continue to monitor. 09/13/2024 17 . Right foot Pain/ Chronic Right Ankle Pain:  Podiatry following. Dr Regal.We will continue to Monitor. 09/13/2024 .    F/U in 1 month .              "

## 2024-09-17 ENCOUNTER — Encounter: Payer: Self-pay | Admitting: Registered Nurse

## 2024-09-21 ENCOUNTER — Ambulatory Visit (INDEPENDENT_AMBULATORY_CARE_PROVIDER_SITE_OTHER)

## 2024-09-21 ENCOUNTER — Ambulatory Visit

## 2024-09-21 DIAGNOSIS — M9261 Juvenile osteochondrosis of tarsus, right ankle: Secondary | ICD-10-CM

## 2024-09-21 DIAGNOSIS — M79671 Pain in right foot: Secondary | ICD-10-CM

## 2024-09-21 DIAGNOSIS — S9001XD Contusion of right ankle, subsequent encounter: Secondary | ICD-10-CM | POA: Diagnosis not present

## 2024-09-21 DIAGNOSIS — Z9889 Other specified postprocedural states: Secondary | ICD-10-CM

## 2024-09-21 DIAGNOSIS — M7661 Achilles tendinitis, right leg: Secondary | ICD-10-CM

## 2024-09-21 MED ORDER — METHYLPREDNISOLONE 4 MG PO TBPK: ORAL_TABLET | ORAL | 0 refills | Status: AC

## 2024-09-21 NOTE — Progress Notes (Unsigned)
 "    Subjective:  Patient ID: Karina Greer, female    DOB: July 05, 1966,  MRN: [id number]  Chief Complaint  Patient presents with   Foot Pain    Patient is here for right ankle pain and burning    DOS: 07/05/24 Procedure: Right leg calcaneal ostectomy, secondary achilles debridement  58 y.o. female returns for post-op check.  She relates to increased pain since Friday.  She states that the area was very swollen but has now calmed down.  She was doing very well after her last visit with me.  She has been weightbearing as tolerated in cam walker, frequently using crutch assist.  Review of Systems: Negative except as noted in the HPI. Denies N/V/F/Ch.  Past Medical History:  Diagnosis Date   Anxiety    Anxiety disorder    Arthritis    Blepharitis    Chronic low back pain    Chronic pain    Depression    Diabetes mellitus without complication (HCC)    Dyslipidemia    Family history of blood clots    Fibromyalgia    GERD (gastroesophageal reflux disease)    Headache    hx migraines   History of kidney stones    History of panic attacks    History of renal calculi    IBS (irritable bowel syndrome)    Interstitial cystitis    Lupus    tested positive for antibodies for lupus. 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    Current Outpatient Medications:    methylPREDNISolone  (MEDROL  DOSEPAK) 4 MG TBPK tablet, 1 pack tapered oral methylprednisolone , Disp: 1 each, Rfl: 0   ACCU-CHEK GUIDE test strip, USE 1 (ONE) EACH DAILY FOR GLUCOSE MONITORING, Disp: , Rfl:    Accu-Chek Softclix Lancets lancets, SMARTSIG:1 stick Topical  Daily, Disp: , Rfl:    albuterol  (PROVENTIL ) (2.5 MG/3ML) 0.083% nebulizer solution, USE 1 VIAL IN NEBULIZER EVERY 6 HOURS, Disp: 120 mL, Rfl: 11   albuterol  (VENTOLIN  HFA) 108 (90 Base) MCG/ACT inhaler, Inhale into the lungs., Disp: , Rfl:    alendronate (FOSAMAX) 70 MG tablet, Take 70 mg by mouth once a week., Disp: , Rfl:    ammonium lactate  (LAC-HYDRIN ) 12 % lotion, APPLY 1 APPLICATION TOPICALLY AS NEEDED FOR DRY SKIN., Disp: 400 mL, Rfl: 0   amoxicillin -clavulanate (AUGMENTIN ) 875-125 MG tablet, Take 1 tablet by mouth 2 (two) times daily., Disp: 14 tablet, Rfl: 0   b complex vitamins tablet, Take 1 tablet by mouth daily., Disp: , Rfl:    chlorhexidine  (PERIDEX ) 0.12 % solution, SMARTSIG:By Mouth, Disp: , Rfl:    Cholecalciferol (VITAMIN D ) 50 MCG (2000 UT) CAPS, Take 1 capsule by mouth once a week., Disp: , Rfl:    dexlansoprazole  (DEXILANT ) 60 MG capsule, Take 1 capsule (60 mg total) by mouth daily., Disp: 90 capsule, Rfl: 0   dexlansoprazole  (DEXILANT ) 60 MG capsule, Take 1 capsule (60 mg total) by mouth daily., Disp: 90 capsule, Rfl: 0   dicyclomine  (BENTYL ) 10 MG capsule,  Take 10 mg by mouth 2 (two) times daily as needed., Disp: , Rfl:    EPINEPHrine  (EPIPEN  2-PAK) 0.3 mg/0.3 mL IJ SOAJ injection, Use as directed, Disp: 1 each, Rfl: 1   fexofenadine (ALLEGRA) 180 MG tablet, Take 180 mg by mouth daily., Disp: , Rfl:    fluticasone  (FLOVENT  HFA) 110 MCG/ACT inhaler, TAKE 1 PUFF BY MOUTH TWICE A DAY, Disp: 12 each, Rfl: 5   HYDROcodone -acetaminophen  (NORCO) 10-325 MG tablet, Take 1 tablet by mouth every 8 (eight) hours as needed. Do Not fill Before 09/27/2024, Disp: 90 tablet, Rfl: 0   hydrocortisone valerate cream (WESTCORT) 0.2 %, Apply topically 2 (two) times daily., Disp: , Rfl:    hydrOXYzine  (ATARAX ) 25 MG tablet, Take 1 tablet (25 mg total) by mouth every 8 (eight) hours as needed., Disp: 30 tablet, Rfl: 3   ketoconazole  (NIZORAL ) 2 % shampoo, Apply 1 application topically 2 (two)  times a week. , Disp: , Rfl:    KRILL OIL PO, Take by mouth., Disp: , Rfl:    lidocaine  (LIDODERM ) 5 %, Place 1 patch onto the skin daily. Remove & Discard patch within 12 hours or as directed by MD, Disp: 90 patch, Rfl: 3   magnesium oxide (MAG-OX) 400 MG tablet, Take by mouth., Disp: , Rfl:    meclizine  (ANTIVERT ) 25 MG tablet, Take 1 tablet (25 mg total) by mouth 3 (three) times daily as needed for dizziness., Disp: 30 tablet, Rfl: 2   nortriptyline  (PAMELOR ) 10 MG capsule, Take 1 capsule (10 mg total) by mouth at bedtime for 7 days, THEN 2 capsules (20 mg total) at bedtime., Disp: 60 capsule, Rfl: 3   ondansetron  (ZOFRAN -ODT) 8 MG disintegrating tablet, Dissolve 1 tablet (8 mg total) by mouth 3 (three) times daily as needed for nausea/ dizziness., Disp: 30 tablet, Rfl: 5   SPIRULINA PO, Take 1 capsule by mouth daily., Disp: , Rfl:    tiZANidine  (ZANAFLEX ) 2 MG tablet, Take 1 tablet (2 mg total) by mouth every 8 (eight) hours as needed., Disp: 50 tablet, Rfl: 2   valACYclovir (VALTREX) 1000 MG tablet, Take by mouth., Disp: , Rfl:    Vitamin D , Ergocalciferol , (DRISDOL ) 1.25 MG (50000 UNIT) CAPS capsule, Take 50,000 Units by mouth 2 (two) times a week., Disp: , Rfl:   Social History   Tobacco Use  Smoking Status Never  Smokeless Tobacco Never    Allergies  Allergen Reactions   Alendronate Sodium Anaphylaxis    Closes throat up   Doxycycline Other (See Comments)   Gadolinium Derivatives Hives, Nausea And Vomiting, Nausea Only and Rash    Other Reaction(s): GI Intolerance   Iodinated Contrast Media Hives and Swelling   Milnacipran Hcl Other (See Comments)    Makes patient bleed from everywhere.   Elavil [Amitriptyline Hcl] Other (See Comments)    Hallucinations    Tramadol Palpitations and Other (See Comments)    Tachycardia    Atorvastatin    Carbapenems Other (See Comments)    Felt like death Felt like death   Clonazepam    Codeine Sulfate Other (See Comments)    Cymbalta [Duloxetine Hcl] Other (See Comments)    Cannot remember    Duloxetine Other (See Comments)    Facial swelling   Ivermectin     Also Ivermectin  Other Reaction(s): Other (See Comments)   Lamotrigine     Methocarbamol     Morphine Other (See Comments)    Caused anxiety attack pt felt like she was going to have  a heart attack Tachycardia per pt   Nsaids Other (See Comments)    Upset stomach Upset stomach   Other Other (See Comments)    bandaid bandaid bandaid bandaid   Ropinirole     Other reaction(s): Other (See Comments) Patient doesn't remember   Skelaxin Other (See Comments)   Sulfamethoxazole    Tessalon [Benzonatate] Other (See Comments)    FEELS LIKE I'M CHOKING   Tolmetin    Vilazodone    Diphenhydramine  Hcl Anxiety   Gabapentin  Nausea Only    Pt was unable to get out of bed, increased appetite  Pt was unable to get out of bed, increased appetite  Pt was unable to get out of bed, increased appetite    Oxycodone Itching and Rash    SEVERE ITCHING   Oxycodone-Acetaminophen  Palpitations   Tetracycline Rash   Objective:  There were no vitals filed for this visit. There is no height or weight on file to calculate BMI. Constitutional Well developed. Well nourished.  Vascular Foot warm and well perfused. Capillary refill normal to all digits.   Neurologic Normal speech. Oriented to person, place, and time. Epicritic sensation to light touch grossly present bilaterally.  Dermatologic Skin is well-healed without any signs of infection.   Orthopedic: Mild tenderness to palpation 2 to 3 cm proximal to the calcaneal insertion.  Patient does have strength to her Achilles tendon.   Radiographs: 3 views of the right foot were taken today.  These show appropriate resection of the Haglund's prominence off the posterior superior calcaneus.  No intratendinous spur remaining.  No complications identified.  Assessment:   1. Contusion of right ankle, subsequent  encounter     Plan:  Patient was evaluated and treated and all questions answered. Doing well in post operative recovery.   S/p Right ankle surgery  -Progressing as expected post-operatively.  She is fully weightbearing in cam walker without wedges at this time. -XR: As above, as expected -WB Status: Patient can slowly begin transition out of cam walker into normal supportive tennis shoe.  She is not to stretch past 90 degrees of ankle dorsiflexion.  Prescription for physical therapy was placed, she has been to this physical therapist before and liked it.  She is to work on strengthening her Achilles, desensitization, transition to stable supportive tennis shoes.  Do not flex beyond 90 degrees ankle dorsiflexion for another 4 weeks. - Incision: Pinpoint eschar remaining, incision otherwise fully healed.  No complications.  RTC 6 weeks for repeat evaluation with pre-clinical XR  Prentice Ovens, DPM  "

## 2024-09-22 ENCOUNTER — Ambulatory Visit

## 2024-09-27 ENCOUNTER — Other Ambulatory Visit: Payer: Self-pay

## 2024-09-28 ENCOUNTER — Other Ambulatory Visit: Payer: Self-pay

## 2024-09-29 ENCOUNTER — Telehealth: Payer: Self-pay | Admitting: Registered Nurse

## 2024-09-29 DIAGNOSIS — M542 Cervicalgia: Secondary | ICD-10-CM

## 2024-09-29 DIAGNOSIS — G894 Chronic pain syndrome: Secondary | ICD-10-CM

## 2024-09-29 DIAGNOSIS — M545 Low back pain, unspecified: Secondary | ICD-10-CM

## 2024-09-29 DIAGNOSIS — G8929 Other chronic pain: Secondary | ICD-10-CM

## 2024-09-29 NOTE — Telephone Encounter (Signed)
 Patient picked up medication

## 2024-09-30 ENCOUNTER — Other Ambulatory Visit: Payer: Self-pay | Admitting: Neurological Surgery

## 2024-09-30 ENCOUNTER — Other Ambulatory Visit: Payer: Self-pay

## 2024-09-30 DIAGNOSIS — M5412 Radiculopathy, cervical region: Secondary | ICD-10-CM

## 2024-09-30 MED ORDER — HYDROCODONE-ACETAMINOPHEN 10-325 MG PO TABS
1.0000 | ORAL_TABLET | Freq: Three times a day (TID) | ORAL | 0 refills | Status: AC | PRN
  Filled 2024-09-30 – 2024-10-26 (×2): qty 90, 30d supply, fill #0

## 2024-09-30 NOTE — Telephone Encounter (Signed)
 PDMP was Reviewed,  Hydrocodone  e-scribed.

## 2024-10-01 ENCOUNTER — Encounter: Payer: Self-pay | Admitting: Physical Therapy

## 2024-10-01 ENCOUNTER — Ambulatory Visit: Admitting: Physical Therapy

## 2024-10-01 ENCOUNTER — Other Ambulatory Visit: Payer: Self-pay

## 2024-10-01 ENCOUNTER — Ambulatory Visit

## 2024-10-01 DIAGNOSIS — M25511 Pain in right shoulder: Secondary | ICD-10-CM | POA: Diagnosis present

## 2024-10-01 DIAGNOSIS — M7661 Achilles tendinitis, right leg: Secondary | ICD-10-CM

## 2024-10-01 DIAGNOSIS — G8929 Other chronic pain: Secondary | ICD-10-CM | POA: Diagnosis present

## 2024-10-01 DIAGNOSIS — M6281 Muscle weakness (generalized): Secondary | ICD-10-CM | POA: Insufficient documentation

## 2024-10-01 DIAGNOSIS — M25571 Pain in right ankle and joints of right foot: Secondary | ICD-10-CM | POA: Insufficient documentation

## 2024-10-01 DIAGNOSIS — M9261 Juvenile osteochondrosis of tarsus, right ankle: Secondary | ICD-10-CM

## 2024-10-01 DIAGNOSIS — R2689 Other abnormalities of gait and mobility: Secondary | ICD-10-CM | POA: Insufficient documentation

## 2024-10-01 DIAGNOSIS — Z9889 Other specified postprocedural states: Secondary | ICD-10-CM

## 2024-10-01 DIAGNOSIS — M25512 Pain in left shoulder: Secondary | ICD-10-CM | POA: Diagnosis present

## 2024-10-01 NOTE — Therapy (Signed)
 " OUTPATIENT PHYSICAL THERAPY LOWER EXTREMITY EVALUATION   Patient Name: Karina Greer MRN: [id number] DOB:12-11-1965, 59 y.o., female Today's Date: 10/01/2024  END OF SESSION:  PT End of Session - 10/01/24 1251     Visit Number 1    Number of Visits 18    Date for Recertification  12/30/24    Authorization Type MCR MCD    Progress Note Due on Visit 10    PT Start Time 1300    PT Stop Time 1345    PT Time Calculation (min) 45 min    Activity Tolerance Patient tolerated treatment well    Behavior During Therapy WFL for tasks assessed/performed          Past Medical History:  Diagnosis Date   Anxiety    Anxiety disorder    Arthritis    Blepharitis    Chronic low back pain    Chronic pain    Depression    Diabetes mellitus without complication (HCC)    Dyslipidemia    Family history of blood clots    Fibromyalgia    GERD (gastroesophageal reflux disease)    Headache    hx migraines   History of kidney stones    History of panic attacks    History of renal calculi    IBS (irritable bowel syndrome)    Interstitial cystitis    Lupus    tested positive for antibodies for lupus. 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   Past Surgical History:  Procedure Laterality Date   ACHILLES TENDON SURGERY Right 3/16   ANTERIOR CERVICAL DECOMP/DISCECTOMY FUSION N/A 03/11/2016   Procedure: C5-6, C6-7 Anterior Cervical Discectomy and Fusion, Allograft, Plate;  Surgeon: Oneil JAYSON Herald, MD;  Location: [facility] OR;  Service: Orthopedics;  Laterality: N/A;   CHOLECYSTECTOMY  2014   COLONOSCOPY WITH PROPOFOL  N/A 08/25/2017   Procedure:  COLONOSCOPY WITH PROPOFOL ;  Surgeon: Viktoria Lamar DASEN, MD;  Location: [facility] ENDOSCOPY;  Service: Endoscopy;  Laterality: N/A;   CYSTO WITH HYDRODISTENSION N/A 01/26/2014   Procedure: CYSTOSCOPY/HYDRODISTENSION;  Surgeon: Alm GORMAN Fragmin, MD;  Location: [facility];  Service: Urology;  Laterality: N/A;   CYSTO/  URETHRAL DILATION/  HYDRODISTENTION/   INSTILLATION THERAPY  07-16-2010//   12-30-2007//   10-27-2006   ELBOW SURGERY Right    EXERCISE TOLERENCE TEST  10-12-2010   NEGATIVE  ADEQUATE ETT/  NO ISCHEMIA OR EVIDENCE HIGH GRADE OBSTRUCTIVE CAD/  NO FURTHER TEST NEEDED   HYSTEROSCOPY W/  NOVASURE ENDOMETRIAL ABLATION  2014   LAPAROSCOPIC OVARIAN CYST BX  2005   AND  URETEROSCOPIC LASER LITHO  STONE EXTRACTION   SHOULDER OPEN ROTATOR CUFF REPAIR Right 2004   TONSILLECTOMY     TRANSTHORACIC ECHOCARDIOGRAM  06-07-2006   normal study/  ef 60-65%   TUBAL LIGATION Bilateral 1995   Patient Active Problem List   Diagnosis Date Noted   Numbness of hand 06/09/2024   Cervical radiculopathy 04/16/2024   Impingement syndrome of right shoulder 09/23/2023   Pain  in left knee 04/22/2023   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 [facility]) 01/09/2023   Epigastric pain 02/12/2022   NAFLD (nonalcoholic fatty liver disease) 94/76/7976   Erythrocytosis 01/23/2022   Pain in left ankle and joints of left foot 12/14/2021   Family history of blood clots 10/09/2021   Difficulty sleeping 06/28/2021   History of Bell's palsy 11/21/2020   Mood change 08/01/2020   Neuralgia of right glossopharyngeal nerve 10/13/2019   Lateral epicondylitis, right elbow 05/25/2019   Bartholin's gland cyst 11/04/2018   Atherosclerosis of aorta 10/28/2018   Elevated glucose 10/28/2018   Mixed hyperlipidemia 10/28/2018   Chronic superficial gastritis without bleeding 09/24/2018   Hypothyroidism 07/20/2018   LLQ pain 12/03/2017   Pre-diabetes 06/03/2017   Rectal bleeding 06/02/2017   S/P cervical spinal fusion  03/11/2016   Bilateral hand pain 11/29/2015   Elevated C-reactive protein 11/29/2015   Elevated rheumatoid factor 11/29/2015   Raynaud's phenomenon without gangrene 11/29/2015   Bloating 09/12/2014   Dizziness 08/08/2014   Peri-menopause 08/08/2014   Chronic tension-type headache, intractable 07/13/2014   Gastroesophageal reflux disease without esophagitis 07/12/2014   Anxiety 04/18/2014   Chronic arthritis 04/18/2014   Cystitis 04/18/2014   Morbid obesity (HCC) 04/18/2014   Fibromyalgia 04/18/2014   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HCC) 04/18/2014   Osteoporosis, 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   Arthropathy 04/18/2014   Cephalalgia 04/04/2014   Numbness and tingling 04/04/2014   White matter abnormality on MRI of brain 02/23/2013   Nonspecific abnormal findings on radiological and other examination of skull and head 02/23/2013   Other nonspecific abnormal result of function study of brain and central nervous system 07/01/2012   Disturbance of skin sensation 07/01/2012   Migraine without aura 07/01/2012   Abdominal pain 02/11/2012   Chronic fatigue syndrome 02/11/2012   Chronic interstitial cystitis 02/11/2012   Dyspareunia 02/11/2012   Dysuria 02/11/2012   Female stress incontinence 02/11/2012   Irritable bowel syndrome with diarrhea 02/11/2012   Nausea and vomiting 02/11/2012   Nocturia 02/11/2012   Urge incontinence 02/11/2012   Urinary urgency 02/11/2012   Lumbar spondylosis 02/04/2012   Myalgia and myositis 02/04/2012   Depression 02/04/2012   Lumbosacral spondylosis without myelopathy 02/04/2012   OBESITY, UNSPECIFIED 10/03/2010   GENERALIZED ANXIETY DISORDER 10/03/2010   Chest pain 10/03/2010    PCP: Rankin Dike, PA-C  REFERRING PROVIDER: Magdalen Prentice PARAS, DPM  REFERRING DIAG: M76.61 (ICD-10-CM) - Achilles tendinitis, right leg  8 weeks s/p achilles tendon repair with deattach/reattach. Work on print production planner, transition out of CAM walker into normal tennis  shoes. Begin stretching of achilles tendon early January.  THERAPY DIAG:  Other abnormalities of gait and mobility  Pain in right ankle and joints of right foot  Muscle weakness (generalized)  Rationale for Evaluation and Treatment: Rehabilitation  ONSET DATE: DOS: 07/05/24 Procedure: Right leg calcaneal ostectomy, secondary achilles debridement    SUBJECTIVE:   SUBJECTIVE STATEMENT: Pt states that she has been doing well overall since surgery, reports episode of redness and drainage from the incision mid November, placed on ABX and noted improvement. She states that about 2 weeks post op, her son was helping clean the area and noted loose stitch which he pulled out and pt noted immediate improvement. Pt came out of CAM boot this week, dons bilateral clogs today due to inc pain with heel of shoes. Symptoms range in intensity from 4/10-10/10. Since beginning prednisone  pt reports 6/10 high end. Pt plans to complete 4 additional weeks before progress DF beyond neutral, can start  wearing DF sock at night. Reports paraesthesia in RLE intermittently and R knee pain which she feels came about from donning CAM boot.   PERTINENT HISTORY: 8 weeks s/p achilles tendon repair with deattach/reattach. Work on print production planner, transition out of CAM walker into normal tennis shoes. Begin stretching of achilles tendon early January. PAIN:  Are you having pain? Yes: NPRS scale: 5/10 Pain location: right ankle and heel Pain description: burning, ache Aggravating factors: walking, weight bearing  Relieving factors: rest, elevation  PRECAUTIONS: Other: WBAT, no DF beyond neutral, okay to wear DF sock at night  RED FLAGS: None   WEIGHT BEARING RESTRICTIONS: Yes WBAT in CAM boot WB Status: Patient can slowly begin transition out of cam walker into normal supportive tennis shoe. She is not to stretch past 90 degrees of ankle dorsiflexion. She is to work on strengthening her Achilles, desensitization,  transition to stable supportive tennis shoes. Do not flex beyond 90 degrees ankle dorsiflexion for another 4 weeks.    FALLS:  Has patient fallen in last 6 months? Yes. Number of falls 4, first week post op, fell down steps going to MD appt, clustered early stages post op with steps.   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  DIAGNOSTIC FINDINGS: see imaging section for details  PATIENT SURVEYS:  LEFS  Extreme difficulty/unable (0), Quite a bit of difficulty (1), Moderate difficulty (2), Little difficulty (3), No difficulty (4) Survey date:  10/01/24  Any of your usual work, housework or school activities 2  2. Usual hobbies, recreational or sporting activities 1  3. Getting into/out of the bath 1  4. Walking between rooms 4  5. Putting on socks/shoes 3  6. Squatting  2  7. Lifting an object, like a bag of groceries from the floor 3  8. Performing light activities around your home 1  9. Performing heavy activities around your home 3  10. Getting into/out of a car 3  11. Walking 2 blocks 0  12. Walking 1 mile 0  13. Going up/down 10 stairs (1 flight) 0  14. Standing for 1 hour 0  15.  sitting for 1 hour 0  16. Running on even ground 0  17. Running on uneven ground 0  18. Making sharp turns while running fast 0  19. Hopping  0  20. Rolling over in bed 4  Score total:  27/80     COGNITION: Overall cognitive status: Within functional limits for tasks assessed     SENSATION: Inc sensitivity noted in ankle complex  EDEMA:   Minor edema noted in the R ankle surrounding malleolus   PALPATION: Inc TTP   LOWER EXTREMITY ROM:  Active ROM Right eval Left eval  Hip flexion    Hip extension    Hip abduction    Hip adduction    Hip internal rotation    Hip external rotation    Knee flexion    Knee extension    Ankle dorsiflexion 0   Ankle plantarflexion 50   Ankle inversion    Ankle eversion     (Blank rows =  not tested)  LOWER EXTREMITY MMT:  MMT Right eval Left eval  Hip flexion    Hip extension    Hip abduction    Hip adduction    Hip internal rotation    Hip external rotation    Knee flexion    Knee extension    Ankle dorsiflexion 3/5   Ankle plantarflexion 3+/5   Ankle inversion    Ankle eversion     (Blank rows = not tested)   FUNCTIONAL TESTS:  Deferred, can integrate at later session  GAIT: Distance walked: lobby to treatment area Assistive device utilized: None Level of assistance: Modified independence Comments: dec WB on RLE, altered heel strike, and dec push off on RLE, dec stride length on LLE                                                                                                                                TREATMENT DATE:   10/01/24- EVAL    PATIENT EDUCATION:  Education details: Pt educated on relevant anatomy, physiology, pathology, diagnosis, prognosis, progression of care, pain and activity modification related to R ankle pain and post operative status. Person educated: Patient Education method: Explanation, Demonstration, and Handouts Education comprehension: verbalized understanding and returned demonstration  HOME EXERCISE PROGRAM: Access Code: [email or web address] URL: [email or web address] Date: 10/01/2024 Prepared by: Stann Ohara  Exercises - Side to Side Weight Shift with Unilateral Counter Support  - 1 x daily - 7 x weekly - 1 sets - 30 reps - 2 hold - Seated Heel Raise  - 1 x daily - 7 x weekly - 3 sets - 15 reps - 2 hold -  Ankle Inversion Eversion Towel Slide  - 1 x daily - 7 x weekly - 3 sets - 15 reps - 2 hold - Towel Desensitization  - 1 x daily - 7 x weekly  ASSESSMENT:  CLINICAL IMPRESSION: Patient is a 59 y.o. F who was seen today for physical therapy evaluation and treatment for s/p Right leg calcaneal ostectomy, secondary achilles debridement 07/05/24. Pt symptoms are consistent with post operative status. Incision site is healing well, no anchoring noted and appears to be closed with some dry skin noted, pt able to see referring provider prior to this PT eval.  Pt is limited in her ROM, strength, stability, endurance, sensitivity at the operative site, and motor control deficits affect pt function. Pt stands to benefit from continued skilled physical therapy to address deficit areas and restore safety with activities and participations at home and in the community.    OBJECTIVE IMPAIRMENTS: Abnormal gait, decreased activity tolerance, decreased balance, decreased mobility, difficulty walking, decreased ROM, decreased strength, increased edema, increased fascial restrictions, increased muscle spasms, impaired sensation, impaired UE functional use, and pain.   ACTIVITY LIMITATIONS: carrying, lifting, bending, standing, squatting, and stairs  PARTICIPATION LIMITATIONS: meal prep, cleaning, laundry, driving, shopping, and community activity  PERSONAL FACTORS: Past/current experiences and Time since onset of injury/illness/exacerbation  are also affecting patient's functional outcome.   REHAB POTENTIAL: Excellent  CLINICAL DECISION MAKING: Stable/uncomplicated  EVALUATION COMPLEXITY: Low   GOALS: Goals reviewed with patient? Yes  SHORT TERM GOALS: Target date: 11/25/24   Pt will report compliance with HEP to work towards ind and home management strategies Baseline: Goal status: INITIAL   2.  Pt will score no less than 37/80 on LEFS to demonstrate improved activity tolerance Baseline: 27 Goal status:  INITIAL   3.  Pt will improve R ankle ROM to full and painless in order to demonstrate progress towards activity tolerance and improved function Baseline:  Goal status: INITIAL   4.  Pt will complete obstacle course of even and uneven surfaces, incline/decline, and elevation change with LRAD and distance no less than 751ft Baseline:  Goal status: INITIAL   5.  Pt will demonstrate no less than 10 degrees DF in R ankle to allow for improved gait mechanics Baseline:  Goal status: INITIAL     LONG TERM GOALS: Target date: 12/30/24   Pt will score no less than 60/80 on LEFS to demonstrate improved activity tolerance Baseline:  Goal status: INITIAL   2.  Pt will report no greater than 0/10 pain over 7 consecutive days to demonstrate maintained reduction in symptoms and improved tolerance to activity Baseline:  Goal status: INITIAL   3.  Pt will be ind in the management of their symptoms at home and in the community Baseline:  Goal status: INITIAL    PLAN:  PT FREQUENCY: 1-2x/week  PT DURATION: 12 weeks  PLANNED INTERVENTIONS: 97110-Therapeutic exercises, 97530- Therapeutic activity, [id number]- Neuromuscular re-education, 97535- Self Care, [id number]- Manual therapy, [id number]- Gait training, [phone]- Orthotic Initial, [phone]- Orthotic/Prosthetic subsequent, [id number]- Electrical stimulation (unattended), [id number]- Vasopneumatic device, [phone] (1-2 muscles), [id number] (3+ muscles)- Dry Needling, Patient/Family education, Cryotherapy, and Moist heat  PLAN FOR NEXT SESSION: Modify HEP as needed, progress soft tissue mobility to R calf complex, progress weight bearing tolerance, modalities as indicated, gait training, provider discretion   Stann DELENA Ohara, PT 10/01/2024, 4:10 PM  "

## 2024-10-01 NOTE — Progress Notes (Unsigned)
 Way better- starting PT today. Minimal discomfort

## 2024-10-05 ENCOUNTER — Other Ambulatory Visit: Payer: Self-pay

## 2024-10-05 ENCOUNTER — Ambulatory Visit

## 2024-10-05 MED ORDER — CHLORHEXIDINE GLUCONATE 0.12 % MT SOLN
OROMUCOSAL | 2 refills | Status: AC
  Filled 2024-10-05: qty 473, 30d supply, fill #0

## 2024-10-05 MED ORDER — AZELASTINE HCL 0.1 % NA SOLN
2.0000 | Freq: Two times a day (BID) | NASAL | 5 refills | Status: AC | PRN
  Filled 2024-10-05: qty 30, 50d supply, fill #0

## 2024-10-05 MED ORDER — DESLORATADINE 5 MG PO TABS
5.0000 mg | ORAL_TABLET | Freq: Every day | ORAL | 3 refills | Status: AC
  Filled 2024-10-05: qty 90, 90d supply, fill #0

## 2024-10-05 MED ORDER — AZELASTINE HCL 0.05 % OP SOLN
1.0000 [drp] | Freq: Two times a day (BID) | OPHTHALMIC | 5 refills | Status: AC | PRN
  Filled 2024-10-05: qty 6, 30d supply, fill #0

## 2024-10-05 MED ORDER — EPINEPHRINE 0.3 MG/0.3ML IJ SOAJ
INTRAMUSCULAR | 0 refills | Status: AC
  Filled 2024-10-05: qty 2, 30d supply, fill #0

## 2024-10-05 MED ORDER — BUDESONIDE-FORMOTEROL FUMARATE 80-4.5 MCG/ACT IN AERO
2.0000 | INHALATION_SPRAY | RESPIRATORY_TRACT | 3 refills | Status: AC | PRN
  Filled 2024-10-05: qty 10.2, 30d supply, fill #0

## 2024-10-07 ENCOUNTER — Other Ambulatory Visit: Payer: Self-pay

## 2024-10-07 ENCOUNTER — Telehealth: Payer: Self-pay

## 2024-10-07 MED ORDER — PREDNISONE 50 MG PO TABS
ORAL_TABLET | ORAL | 0 refills | Status: AC
  Filled 2024-10-07: qty 3, 1d supply, fill #0

## 2024-10-07 NOTE — Telephone Encounter (Signed)
 13 hour prep for iodine contrast allergy. Pt states she does'nt remember the iodine contrast allergy but allergy logged in 2020. Due to numerous allergies, pt agreed to pre med, however, she has a Benedryl intolerance of high anxiety. Spoke with Dr Shoshana and he is aware of pt's allergies and is ok with giving Prednisone  only. Dr Shoshana hidalgo to try to offer Benadryl  on pt arrival.

## 2024-10-11 ENCOUNTER — Other Ambulatory Visit: Payer: Self-pay

## 2024-10-11 ENCOUNTER — Ambulatory Visit: Admitting: Physical Therapy

## 2024-10-12 ENCOUNTER — Other Ambulatory Visit: Payer: Self-pay | Admitting: Medical Genetics

## 2024-10-13 ENCOUNTER — Ambulatory Visit: Payer: Self-pay

## 2024-10-13 ENCOUNTER — Ambulatory Visit

## 2024-10-13 ENCOUNTER — Ambulatory Visit (INDEPENDENT_AMBULATORY_CARE_PROVIDER_SITE_OTHER): Admitting: Orthopedic Surgery

## 2024-10-13 VITALS — BP 146/79 | HR 86 | Ht 63.0 in | Wt 222.0 lb

## 2024-10-13 DIAGNOSIS — R2689 Other abnormalities of gait and mobility: Secondary | ICD-10-CM | POA: Diagnosis not present

## 2024-10-13 DIAGNOSIS — M25511 Pain in right shoulder: Secondary | ICD-10-CM

## 2024-10-13 DIAGNOSIS — M25512 Pain in left shoulder: Secondary | ICD-10-CM

## 2024-10-13 DIAGNOSIS — M542 Cervicalgia: Secondary | ICD-10-CM

## 2024-10-13 DIAGNOSIS — G8929 Other chronic pain: Secondary | ICD-10-CM

## 2024-10-13 DIAGNOSIS — M6281 Muscle weakness (generalized): Secondary | ICD-10-CM

## 2024-10-13 DIAGNOSIS — M25571 Pain in right ankle and joints of right foot: Secondary | ICD-10-CM

## 2024-10-13 NOTE — Progress Notes (Signed)
 Orthopedic Spine Surgery Office Note  Assessment: Patient is a 59 y.o. female with bilateral shoulder pain. No significant stenosis seen on MRI of her neck to explain the pain. Does have physical exam findings point towards the shoulder as etiology   Plan: -Explained that initially conservative treatment is tried as a significant number of patients may experience relief with these treatment modalities. Discussed that the conservative treatments include:  -activity modification  -physical therapy  -over the counter pain medications  -medrol  dosepak  -steroid injections -Patient has tried TENS unit, PT, topical analgesia, home exercises, oral steroids - It has been a while since patient has done PT for her shoulders but it helped in the past.  Provided her with a new referral today -Will need an A1c of 7.5 or less prior to any surgery -Patient has 30 allergies listed in her chart. Will need to set realistic expectations if we ever decide on surgery as an option -Patient should return to office on an as needed basis   Patient expressed understanding of the plan and all questions were answered to the patient's satisfaction.   ___________________________________________________________________________   History:  Patient is a 59 y.o. female who presents today for cervical spine.  Patient had a ACDF with Dr. Barbarann in 2017.  She did well after surgery but within the last year has noticed pain in the bilateral shoulders.  It started in her right shoulder.  She said it felt locked up.  She had difficulty moving it.  She noticed improvement after a course of oral steroids.  She has noticed some more mild pain and stiffness in the left shoulder.  There was no trauma or injury that preceded the onset of the pain.  She had been seeing Dr. Joshua with neurosurgery who told her that he did not feel it was coming from her neck.  He believes that the symptoms were more consistent with shoulder.  She said  that Dr. Barbarann had told her that it was more likely coming from the neck.  So, she comes in today for another opinion.  Has a history of stress incontinence.  No recent changes in bowel or bladder habits.  No saddle anesthesia.  No unsteadiness with gait.  Is ambulate without any assist devices.  No difficulty with fine motor skills in the hands.  Treatments tried: TENS unit, PT, topical analgesia, home exercises, oral steroids  Review of systems: Denies fevers and chills, night sweats, unexplained weight loss, history of cancer.  Has had pain that wakes her at night  Past medical history: Anxiety Fibromyalgia Chronic low back pain HLD DM GERD IBS Interstitial cystitis OSA Stress incontinence  Allergies x30  Past surgical history:  C5-7 ACDF Cholecystectomy Achilles tendon repair Elbow surgery Tonsillectomy Endometrial ablation Tubal ligation Rotator cuff repair  Social history: Denies use of nicotine product (smoking, vaping, patches, smokeless) Alcohol  use: denies Denies recreational drug use   Physical Exam:  BMI of 39.3  General: no acute distress, appears stated age Neurologic: alert, answering questions appropriately, following commands Respiratory: unlabored breathing on room air, symmetric chest rise Psychiatric: appropriate affect, normal cadence to speech   MSK (spine):  -Strength exam      Left  Right Grip strength                5/5  5/5 Interosseus   5/5   5/5 Wrist extension  5/5  5/5 Wrist flexion   5/5  5/5 Elbow flexion   5/5  5/5 Deltoid  5/5  5/5  -Sensory exam    Sensation intact to light touch in C5-T1 nerve distributions of bilateral upper extremities  -Brachioradialis DTR: 1/4 on the left, 1/4 on the right -Biceps DTR: 1/4 on the left, 1/4 on the right  -Spurling: Negative bilaterally -Hoffman sign: Negative bilaterally -Clonus: No beats bilaterally -Interosseous wasting: None seen -Grip and release test: Negative -Gait:  Normal, unaided  Left shoulder exam: Pain with external rotation past 70 degrees, no pain with internal rotation, full passive range of motion, mild pain with Jobe test, no weakness with Jobe test, negative belly press, no weakness with external rotation with arm at side Right shoulder exam: Pain with external rotation past 80 degrees, full passive range of motion, pain with Jobe test but no weakness, negative belly press, no weakness with external rotation with arm at side   Imaging: XRs of the cervical spine from 10/13/2024 were independently reviewed and interpreted, showing disc height loss with anterior osteophyte formation at C3/4 and C4/5.  There is anterior instrumentation from C5-C7.  No lucency seen around the screws.  Interbody's appear in appropriate position.  There appears to be fusion mass spanning the former C5/6 and C6/7 disc bases.  No fracture or dislocation seen.  XRs of the right shoulder from 10/13/2024 were independently reviewed and interpreted, showing no significant degenerative changes.  No fracture or dislocation seen.  XRs of the left shoulder from 10/13/2024 were independently reviewed and interpreted, showing no significant degenerative changes.  No fracture or dislocation seen.  MRI of the cervical spine from 05/04/2024 was independent reviewed and interpreted, showing no significant central or foraminal stenosis in the cervical spine.   Patient name: Karina Greer Patient MRN: [id number] Date of visit: 10/13/24

## 2024-10-13 NOTE — Therapy (Signed)
 " OUTPATIENT PHYSICAL THERAPY LOWER EXTREMITY EVALUATION   Patient Name: Karina Greer MRN: [id number] DOB:October 08, 1965, 59 y.o., female Today's Date: 10/14/2024  END OF SESSION:  PT End of Session - 10/13/24 1539     Visit Number 2    Number of Visits 18    Date for Recertification  12/30/24    Authorization Type MCR MCD    Progress Note Due on Visit 10    PT Start Time 1530    PT Stop Time 1610    PT Time Calculation (min) 40 min    Activity Tolerance Patient tolerated treatment well    Behavior During Therapy WFL for tasks assessed/performed           Past Medical History:  Diagnosis Date   Anxiety    Anxiety disorder    Arthritis    Blepharitis    Chronic low back pain    Chronic pain    Depression    Diabetes mellitus without complication (HCC)    Dyslipidemia    Family history of blood clots    Fibromyalgia    GERD (gastroesophageal reflux disease)    Headache    hx migraines   History of kidney stones    History of panic attacks    History of renal calculi    IBS (irritable bowel syndrome)    Interstitial cystitis    Lupus    tested positive for antibodies for lupus. 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   Past Surgical History:  Procedure Laterality Date   ACHILLES TENDON SURGERY Right 3/16   ANTERIOR CERVICAL DECOMP/DISCECTOMY FUSION N/A 03/11/2016   Procedure: C5-6, C6-7 Anterior Cervical Discectomy and Fusion, Allograft, Plate;  Surgeon: Oneil JAYSON Herald, MD;  Location: [facility] OR;  Service: Orthopedics;  Laterality: N/A;   CHOLECYSTECTOMY  2014   COLONOSCOPY WITH PROPOFOL  N/A 08/25/2017   Procedure:  COLONOSCOPY WITH PROPOFOL ;  Surgeon: Viktoria Lamar DASEN, MD;  Location: [facility] ENDOSCOPY;  Service: Endoscopy;  Laterality: N/A;   CYSTO WITH HYDRODISTENSION N/A 01/26/2014   Procedure: CYSTOSCOPY/HYDRODISTENSION;  Surgeon: Alm GORMAN Fragmin, MD;  Location: [facility];  Service: Urology;  Laterality: N/A;   CYSTO/  URETHRAL DILATION/  HYDRODISTENTION/   INSTILLATION THERAPY  07-16-2010//   12-30-2007//   10-27-2006   ELBOW SURGERY Right    EXERCISE TOLERENCE TEST  10-12-2010   NEGATIVE  ADEQUATE ETT/  NO ISCHEMIA OR EVIDENCE HIGH GRADE OBSTRUCTIVE CAD/  NO FURTHER TEST NEEDED   HYSTEROSCOPY W/  NOVASURE ENDOMETRIAL ABLATION  2014   LAPAROSCOPIC OVARIAN CYST BX  2005   AND  URETEROSCOPIC LASER LITHO  STONE EXTRACTION   SHOULDER OPEN ROTATOR CUFF REPAIR Right 2004   TONSILLECTOMY     TRANSTHORACIC ECHOCARDIOGRAM  06-07-2006   normal study/  ef 60-65%   TUBAL LIGATION Bilateral 1995   Patient Active Problem List   Diagnosis Date Noted   Numbness of hand 06/09/2024   Cervical radiculopathy 04/16/2024   Impingement syndrome of right shoulder 09/23/2023  Pain in left knee 04/22/2023   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 [facility]) 01/09/2023   Epigastric pain 02/12/2022   NAFLD (nonalcoholic fatty liver disease) 94/76/7976   Erythrocytosis 01/23/2022   Pain in left ankle and joints of left foot 12/14/2021   Family history of blood clots 10/09/2021   Difficulty sleeping 06/28/2021   History of Bell's palsy 11/21/2020   Mood change 08/01/2020   Neuralgia of right glossopharyngeal nerve 10/13/2019   Lateral epicondylitis, right elbow 05/25/2019   Bartholin's gland cyst 11/04/2018   Atherosclerosis of aorta 10/28/2018   Elevated glucose 10/28/2018   Mixed hyperlipidemia 10/28/2018   Chronic superficial gastritis without bleeding 09/24/2018   Hypothyroidism 07/20/2018   LLQ pain 12/03/2017   Pre-diabetes 06/03/2017   Rectal bleeding 06/02/2017   S/P cervical spinal fusion  03/11/2016   Bilateral hand pain 11/29/2015   Elevated C-reactive protein 11/29/2015   Elevated rheumatoid factor 11/29/2015   Raynaud's phenomenon without gangrene 11/29/2015   Bloating 09/12/2014   Dizziness 08/08/2014   Peri-menopause 08/08/2014   Chronic tension-type headache, intractable 07/13/2014   Gastroesophageal reflux disease without esophagitis 07/12/2014   Anxiety 04/18/2014   Chronic arthritis 04/18/2014   Cystitis 04/18/2014   Morbid obesity (HCC) 04/18/2014   Fibromyalgia 04/18/2014   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HCC) 04/18/2014   Osteoporosis, 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   Arthropathy 04/18/2014   Cephalalgia 04/04/2014   Numbness and tingling 04/04/2014   White matter abnormality on MRI of brain 02/23/2013   Nonspecific abnormal findings on radiological and other examination of skull and head 02/23/2013   Other nonspecific abnormal result of function study of brain and central nervous system 07/01/2012   Disturbance of skin sensation 07/01/2012   Migraine without aura 07/01/2012   Abdominal pain 02/11/2012   Chronic fatigue syndrome 02/11/2012   Chronic interstitial cystitis 02/11/2012   Dyspareunia 02/11/2012   Dysuria 02/11/2012   Female stress incontinence 02/11/2012   Irritable bowel syndrome with diarrhea 02/11/2012   Nausea and vomiting 02/11/2012   Nocturia 02/11/2012   Urge incontinence 02/11/2012   Urinary urgency 02/11/2012   Lumbar spondylosis 02/04/2012   Myalgia and myositis 02/04/2012   Depression 02/04/2012   Lumbosacral spondylosis without myelopathy 02/04/2012   OBESITY, UNSPECIFIED 10/03/2010   GENERALIZED ANXIETY DISORDER 10/03/2010   Chest pain 10/03/2010    PCP: Rankin Dike, PA-C  REFERRING PROVIDER: Magdalen Prentice PARAS, DPM  REFERRING DIAG: M76.61 (ICD-10-CM) - Achilles tendinitis, right leg  8 weeks s/p achilles tendon repair with deattach/reattach. Work on print production planner, transition out of CAM walker into normal tennis  shoes. Begin stretching of achilles tendon early January.  THERAPY DIAG:  Pain in right ankle and joints of right foot  Muscle weakness (generalized)  Chronic pain of both shoulders  Rationale for Evaluation and Treatment: Rehabilitation  ONSET DATE: DOS: 07/05/24 Procedure: Right leg calcaneal ostectomy, secondary achilles debridement    SUBJECTIVE:   SUBJECTIVE STATEMENT:  Pain in R foot is 5/10 currently. Has been able to walk without shoes in the house for a short duration. Exercises have been helping. 5/10 BL shoulders.   Pt states that she has been doing well overall since surgery, reports episode of redness and drainage from the incision mid November, placed on ABX and noted improvement. She states that about 2 weeks post op, her son was helping clean the area and noted loose stitch which he pulled out and pt noted immediate improvement. Pt came out of CAM boot this week, dons bilateral clogs today due to inc pain with  heel of shoes. Symptoms range in intensity from 4/10-10/10. Since beginning prednisone  pt reports 6/10 high end. Pt plans to complete 4 additional weeks before progress DF beyond neutral, can start wearing DF sock at night. Reports paraesthesia in RLE intermittently and R knee pain which she feels came about from donning CAM boot.   PERTINENT HISTORY: 8 weeks s/p achilles tendon repair with deattach/reattach. Work on print production planner, transition out of CAM walker into normal tennis shoes. Begin stretching of achilles tendon early January. PAIN:  Are you having pain? Yes: NPRS scale: 5/10 Pain location: right ankle and heel Pain description: burning, ache Aggravating factors: walking, weight bearing  Relieving factors: rest, elevation  PRECAUTIONS: Other: WBAT, no DF beyond neutral, okay to wear DF sock at night  RED FLAGS: None   WEIGHT BEARING RESTRICTIONS: Yes WBAT in CAM boot WB Status: Patient can slowly begin transition out of cam walker into normal  supportive tennis shoe. She is not to stretch past 90 degrees of ankle dorsiflexion. She is to work on strengthening her Achilles, desensitization, transition to stable supportive tennis shoes. Do not flex beyond 90 degrees ankle dorsiflexion for another 4 weeks.    FALLS:  Has patient fallen in last 6 months? Yes. Number of falls 4, first week post op, fell down steps going to MD appt, clustered early stages post op with steps.   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  DIAGNOSTIC FINDINGS: see imaging section for details  PATIENT SURVEYS:  LEFS  Extreme difficulty/unable (0), Quite a bit of difficulty (1), Moderate difficulty (2), Little difficulty (3), No difficulty (4) Survey date:  10/01/24  Any of your usual work, housework or school activities 2  2. Usual hobbies, recreational or sporting activities 1  3. Getting into/out of the bath 1  4. Walking between rooms 4  5. Putting on socks/shoes 3  6. Squatting  2  7. Lifting an object, like a bag of groceries from the floor 3  8. Performing light activities around your home 1  9. Performing heavy activities around your home 3  10. Getting into/out of a car 3  11. Walking 2 blocks 0  12. Walking 1 mile 0  13. Going up/down 10 stairs (1 flight) 0  14. Standing for 1 hour 0  15.  sitting for 1 hour 0  16. Running on even ground 0  17. Running on uneven ground 0  18. Making sharp turns while running fast 0  19. Hopping  0  20. Rolling over in bed 4  Score total:  27/80      BL SHOULDERS: Quickdash: 37   COGNITION: Overall cognitive status: Within functional limits for tasks assessed     SENSATION: Inc sensitivity noted in ankle complex  EDEMA:  Minor edema noted in the R ankle surrounding malleolus   PALPATION: Inc TTP  TTP BL infraspinatus and lateral shoulder Upper Extremity ROM:  <90 abd R!, 115 abd L!  Flexion BL WFL  LOWER EXTREMITY ROM:  Active ROM Right eval Left eval  Hip flexion    Hip extension  Hip abduction    Hip adduction    Hip internal rotation    Hip external rotation    Knee flexion    Knee extension    Ankle dorsiflexion 0   Ankle plantarflexion 50   Ankle inversion    Ankle eversion     (Blank rows = not tested)   Upper Extremity MMT:  Flexion 4-BL Abduction 4- BL 4 ER and 4+ IR L  4- ER and 4+ IR R  LOWER EXTREMITY MMT:  MMT Right eval Left eval  Hip flexion    Hip extension    Hip abduction    Hip adduction    Hip internal rotation    Hip external rotation    Knee flexion    Knee extension    Ankle dorsiflexion 3/5   Ankle plantarflexion 3+/5   Ankle inversion    Ankle eversion     (Blank rows = not tested)   FUNCTIONAL TESTS:  Deferred, can integrate at later session  +empty can test on R  GAIT: Distance walked: lobby to treatment area Assistive device utilized: None Level of assistance: Modified independence Comments: dec WB on RLE, altered heel strike, and dec push off on RLE, dec stride length on LLE                                                                                                                                TREATMENT DATE:    10/13/24: Therapeutic Activity: Recert; added in BL shoulders to POC. HEP reassessment and update   10/01/24- EVAL    PATIENT EDUCATION:  Education details: Pt educated on relevant anatomy, physiology, pathology, diagnosis, prognosis, progression of care, pain and activity modification related to R ankle pain and post  operative status. Person educated: Patient Education method: Explanation, Demonstration, and Handouts Education comprehension: verbalized understanding and returned demonstration  HOME EXERCISE PROGRAM: Access Code: [email or web address] URL: [email or web address] Date: 10/01/2024 Prepared by: Stann Ohara  Exercises - Side to Side Weight Shift with Unilateral Counter Support  - 1 x daily - 7 x weekly - 1 sets - 30 reps - 2 hold - Seated Heel Raise  - 1 x daily - 7 x weekly - 3 sets - 15 reps - 2 hold - Ankle Inversion Eversion Towel Slide  - 1 x daily - 7 x weekly - 3 sets - 15 reps - 2 hold - Towel Desensitization  - 1 x daily - 7 x weekly  BL shoulders  Access Code: [email or web address] URL: [email or web address] Date: 10/13/2024 Prepared by: Washington Scot  Exercises - Shoulder External Rotation with Anchored Resistance  - 2 x daily - 5 x weekly - 2 sets - 6 reps - 3 hold - Standing Bilateral Low Shoulder Row with Anchored Resistance  - 2 x daily - 5 x weekly - 2 sets - 6 reps - 3 hold  ASSESSMENT:  CLINICAL IMPRESSION: Patient tolerated treatment with no increases in pain with  progressions in BL shoulder sx modulation and HEP update. Current deficits include: excessive pain in RLE and BL shoulders, functional strength/activity tolerance, and ROM. As a result, patient would continue to benefit from skilled PT to address said deficits via plan below.    Patient is a 59 y.o. F who was seen today for physical therapy evaluation and treatment for s/p Right leg calcaneal ostectomy, secondary achilles debridement 07/05/24. Pt symptoms are consistent with post operative status. Incision site is healing well, no anchoring noted and appears to be closed with some dry skin noted, pt able to see referring provider prior to this PT eval.  Pt is limited in her ROM, strength, stability, endurance, sensitivity at the operative site, and motor control deficits affect pt function. Pt stands to  benefit from continued skilled physical therapy to address deficit areas and restore safety with activities and participations at home and in the community.    OBJECTIVE IMPAIRMENTS: Abnormal gait, decreased activity tolerance, decreased balance, decreased mobility, difficulty walking, decreased ROM, decreased strength, increased edema, increased fascial restrictions, increased muscle spasms, impaired sensation, impaired UE functional use, and pain.   ACTIVITY LIMITATIONS: carrying, lifting, bending, standing, squatting, and stairs  PARTICIPATION LIMITATIONS: meal prep, cleaning, laundry, driving, shopping, and community activity  PERSONAL FACTORS: Past/current experiences and Time since onset of injury/illness/exacerbation are also affecting patient's functional outcome.   REHAB POTENTIAL: Excellent  CLINICAL DECISION MAKING: Stable/uncomplicated  EVALUATION COMPLEXITY: Low   GOALS: Goals reviewed with patient? Yes  SHORT TERM GOALS: Target date: 11/25/24   Pt will report compliance with HEP to work towards ind and home management strategies Baseline: Goal status: INITIAL   2.  Pt will score no less than 37/80 on LEFS to demonstrate improved activity tolerance Baseline: 27 Goal status: INITIAL   3.  Pt will improve R ankle ROM to full and painless in order to demonstrate progress towards activity tolerance and improved function Baseline:  Goal status: INITIAL   4.  Pt will complete obstacle course of even and uneven surfaces, incline/decline, and elevation change with LRAD and distance no less than 747ft Baseline:  Goal status: INITIAL   5.  Pt will demonstrate no less than 10 degrees DF in R ankle to allow for improved gait mechanics Baseline:  Goal status: INITIAL     LONG TERM GOALS: Target date: 12/30/24   Pt will score no less than 60/80 on LEFS to demonstrate improved activity tolerance Baseline:  Goal status: INITIAL   2.  Pt will report no greater than 0/10 pain  over 7 consecutive days to demonstrate maintained reduction in symptoms and improved tolerance to activity Baseline:  Goal status: INITIAL   3.  Pt will be ind in the management of their symptoms at home and in the community Baseline:  Goal status: INITIAL    PLAN:  PT FREQUENCY: 1-2x/week  PT DURATION: 12 weeks  PLANNED INTERVENTIONS: 97110-Therapeutic exercises, 97530- Therapeutic activity, [id number]- Neuromuscular re-education, 97535- Self Care, [id number]- Manual therapy, [id number]- Gait training, [phone]- Orthotic Initial, [phone]- Orthotic/Prosthetic subsequent, [id number]- Electrical stimulation (unattended), 97016- Vasopneumatic device, [phone] (1-2 muscles), [id number] (3+ muscles)- Dry Needling, Patient/Family education, Cryotherapy, and Moist heat  PLAN FOR NEXT SESSION: Modify HEP as needed, progress soft tissue mobility to R calf complex, progress weight bearing tolerance, modalities as indicated, gait training, provider discretion   Washington Odessia Scot, PT 10/14/2024, 2:11 PM  "

## 2024-10-14 ENCOUNTER — Ambulatory Visit: Admitting: Physical Therapy

## 2024-10-15 ENCOUNTER — Inpatient Hospital Stay: Admission: RE | Admit: 2024-10-15 | Source: Ambulatory Visit

## 2024-10-15 ENCOUNTER — Other Ambulatory Visit

## 2024-10-18 ENCOUNTER — Ambulatory Visit

## 2024-10-20 NOTE — Therapy (Signed)
 " OUTPATIENT PHYSICAL THERAPY LOWER EXTREMITY TREATMENT   Patient Name: Karina Greer MRN: [id number] DOB:02/23/1966, 59 y.o., female Today's Date: 10/21/2024  END OF SESSION:  PT End of Session - 10/21/24 1515     Visit Number 3    Number of Visits 18    Date for Recertification  12/30/24    Authorization Type MCR MCD    Progress Note Due on Visit 10    PT Start Time 1515    PT Stop Time 1612    PT Time Calculation (min) 57 min    Activity Tolerance Patient tolerated treatment well    Behavior During Therapy WFL for tasks assessed/performed            Past Medical History:  Diagnosis Date   Anxiety    Anxiety disorder    Arthritis    Blepharitis    Chronic low back pain    Chronic pain    Depression    Diabetes mellitus without complication (HCC)    Dyslipidemia    Family history of blood clots    Fibromyalgia    GERD (gastroesophageal reflux disease)    Headache    hx migraines   History of kidney stones    History of panic attacks    History of renal calculi    IBS (irritable bowel syndrome)    Interstitial cystitis    Lupus    tested positive for antibodies for lupus. 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   Past Surgical History:  Procedure Laterality Date   ACHILLES TENDON SURGERY Right 3/16   ANTERIOR CERVICAL DECOMP/DISCECTOMY FUSION N/A 03/11/2016   Procedure: C5-6, C6-7 Anterior Cervical Discectomy and Fusion, Allograft, Plate;  Surgeon: Oneil JAYSON Herald, MD;  Location: [facility] OR;  Service: Orthopedics;  Laterality: N/A;   CHOLECYSTECTOMY  2014   COLONOSCOPY WITH PROPOFOL  N/A 08/25/2017   Procedure:  COLONOSCOPY WITH PROPOFOL ;  Surgeon: Viktoria Lamar DASEN, MD;  Location: [facility] ENDOSCOPY;  Service: Endoscopy;  Laterality: N/A;   CYSTO WITH HYDRODISTENSION N/A 01/26/2014   Procedure: CYSTOSCOPY/HYDRODISTENSION;  Surgeon: Alm GORMAN Fragmin, MD;  Location: [facility];  Service: Urology;  Laterality: N/A;   CYSTO/  URETHRAL DILATION/  HYDRODISTENTION/   INSTILLATION THERAPY  07-16-2010//   12-30-2007//   10-27-2006   ELBOW SURGERY Right    EXERCISE TOLERENCE TEST  10-12-2010   NEGATIVE  ADEQUATE ETT/  NO ISCHEMIA OR EVIDENCE HIGH GRADE OBSTRUCTIVE CAD/  NO FURTHER TEST NEEDED   HYSTEROSCOPY W/  NOVASURE ENDOMETRIAL ABLATION  2014   LAPAROSCOPIC OVARIAN CYST BX  2005   AND  URETEROSCOPIC LASER LITHO  STONE EXTRACTION   SHOULDER OPEN ROTATOR CUFF REPAIR Right 2004   TONSILLECTOMY     TRANSTHORACIC ECHOCARDIOGRAM  06-07-2006   normal study/  ef 60-65%   TUBAL LIGATION Bilateral 1995   Patient Active Problem List   Diagnosis Date Noted   Numbness of hand 06/09/2024   Cervical radiculopathy 04/16/2024   Impingement syndrome of right shoulder 09/23/2023  Pain in left knee 04/22/2023   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 [facility]) 01/09/2023   Epigastric pain 02/12/2022   NAFLD (nonalcoholic fatty liver disease) 94/76/7976   Erythrocytosis 01/23/2022   Pain in left ankle and joints of left foot 12/14/2021   Family history of blood clots 10/09/2021   Difficulty sleeping 06/28/2021   History of Bell's palsy 11/21/2020   Mood change 08/01/2020   Neuralgia of right glossopharyngeal nerve 10/13/2019   Lateral epicondylitis, right elbow 05/25/2019   Bartholin's gland cyst 11/04/2018   Atherosclerosis of aorta 10/28/2018   Elevated glucose 10/28/2018   Mixed hyperlipidemia 10/28/2018   Chronic superficial gastritis without bleeding 09/24/2018   Hypothyroidism 07/20/2018   LLQ pain 12/03/2017   Pre-diabetes 06/03/2017   Rectal bleeding 06/02/2017   S/P cervical spinal fusion  03/11/2016   Bilateral hand pain 11/29/2015   Elevated C-reactive protein 11/29/2015   Elevated rheumatoid factor 11/29/2015   Raynaud's phenomenon without gangrene 11/29/2015   Bloating 09/12/2014   Dizziness 08/08/2014   Peri-menopause 08/08/2014   Chronic tension-type headache, intractable 07/13/2014   Gastroesophageal reflux disease without esophagitis 07/12/2014   Anxiety 04/18/2014   Chronic arthritis 04/18/2014   Cystitis 04/18/2014   Morbid obesity (HCC) 04/18/2014   Fibromyalgia 04/18/2014   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HCC) 04/18/2014   Osteoporosis, 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   Arthropathy 04/18/2014   Cephalalgia 04/04/2014   Numbness and tingling 04/04/2014   White matter abnormality on MRI of brain 02/23/2013   Nonspecific abnormal findings on radiological and other examination of skull and head 02/23/2013   Other nonspecific abnormal result of function study of brain and central nervous system 07/01/2012   Disturbance of skin sensation 07/01/2012   Migraine without aura 07/01/2012   Abdominal pain 02/11/2012   Chronic fatigue syndrome 02/11/2012   Chronic interstitial cystitis 02/11/2012   Dyspareunia 02/11/2012   Dysuria 02/11/2012   Female stress incontinence 02/11/2012   Irritable bowel syndrome with diarrhea 02/11/2012   Nausea and vomiting 02/11/2012   Nocturia 02/11/2012   Urge incontinence 02/11/2012   Urinary urgency 02/11/2012   Lumbar spondylosis 02/04/2012   Myalgia and myositis 02/04/2012   Depression 02/04/2012   Lumbosacral spondylosis without myelopathy 02/04/2012   OBESITY, UNSPECIFIED 10/03/2010   GENERALIZED ANXIETY DISORDER 10/03/2010   Chest pain 10/03/2010    PCP: Rankin Dike, PA-C  REFERRING PROVIDER: Magdalen Prentice PARAS, DPM  REFERRING DIAG: M76.61 (ICD-10-CM) - Achilles tendinitis, right leg  8 weeks s/p achilles tendon repair with deattach/reattach. Work on print production planner, transition out of CAM walker into normal tennis  shoes. Begin stretching of achilles tendon early January.  THERAPY DIAG:  Pain in right ankle and joints of right foot  Muscle weakness (generalized)  Chronic pain of both shoulders  Other abnormalities of gait and mobility  Rationale for Evaluation and Treatment: Rehabilitation  ONSET DATE: DOS: 07/05/24 Procedure: Right leg calcaneal ostectomy, secondary achilles debridement    SUBJECTIVE:   SUBJECTIVE STATEMENT: Pt states that Tuesday afternoon, pt was walking her dog and slipped, she was able to regain her balance, but reports aberant movement of the R foot in the process. Noted pulling sensation/tension in the achilles tendon immediately following. Reports inc pain described as strong ache with AROM DF of the right foot, able to return symptoms to baseline with cessation of movement. Reports similar symptoms with heel off moment when amb. Symptoms are overall unchanging since this incident.   Pt states that she has been doing well overall since surgery, reports episode of redness and  drainage from the incision mid November, placed on ABX and noted improvement. She states that about 2 weeks post op, her son was helping clean the area and noted loose stitch which he pulled out and pt noted immediate improvement. Pt came out of CAM boot this week, dons bilateral clogs today due to inc pain with heel of shoes. Symptoms range in intensity from 4/10-10/10. Since beginning prednisone  pt reports 6/10 high end. Pt plans to complete 4 additional weeks before progress DF beyond neutral, can start wearing DF sock at night. Reports paraesthesia in RLE intermittently and R knee pain which she feels came about from donning CAM boot.   PERTINENT HISTORY: 8 weeks s/p achilles tendon repair with deattach/reattach. Work on print production planner, transition out of CAM walker into normal tennis shoes. Begin stretching of achilles tendon early January. PAIN:  Are you having pain? Yes: NPRS scale: 5/10 Pain  location: right ankle and heel Pain description: burning, ache Aggravating factors: walking, weight bearing  Relieving factors: rest, elevation  PRECAUTIONS: Other: WBAT, no DF beyond neutral, okay to wear DF sock at night  RED FLAGS: None   WEIGHT BEARING RESTRICTIONS: Yes WBAT in CAM boot WB Status: Patient can slowly begin transition out of cam walker into normal supportive tennis shoe. She is not to stretch past 90 degrees of ankle dorsiflexion. She is to work on strengthening her Achilles, desensitization, transition to stable supportive tennis shoes. Do not flex beyond 90 degrees ankle dorsiflexion for another 4 weeks.    FALLS:  Has patient fallen in last 6 months? Yes. Number of falls 4, first week post op, fell down steps going to MD appt, clustered early stages post op with steps.   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  DIAGNOSTIC FINDINGS: see imaging section for details  PATIENT SURVEYS:  LEFS  Extreme difficulty/unable (0), Quite a bit of difficulty (1), Moderate difficulty (2), Little difficulty (3), No difficulty (4) Survey date:  10/01/24  Any of your usual work, housework or school activities 2  2. Usual hobbies, recreational or sporting activities 1  3. Getting into/out of the bath 1  4. Walking between rooms 4  5. Putting on socks/shoes 3  6. Squatting  2  7. Lifting an object, like a bag of groceries from the floor 3  8. Performing light activities around your home 1  9. Performing heavy activities  around your home 3  10. Getting into/out of a car 3  11. Walking 2 blocks 0  12. Walking 1 mile 0  13. Going up/down 10 stairs (1 flight) 0  14. Standing for 1 hour 0  15.  sitting for 1 hour 0  16. Running on even ground 0  17. Running on uneven ground 0  18. Making sharp turns while running fast 0  19. Hopping  0  20. Rolling over in bed 4  Score total:  27/80     BL SHOULDERS: Quickdash: 37   COGNITION: Overall cognitive status: Within functional limits for tasks assessed     SENSATION: Inc sensitivity noted in ankle complex  EDEMA:  Minor edema noted in the R ankle surrounding malleolus   PALPATION: Inc TTP  TTP BL infraspinatus and lateral shoulder Upper Extremity ROM:  <90 abd R!, 115 abd L!  Flexion BL WFL  LOWER EXTREMITY ROM:  Active ROM Right eval Left eval  Hip flexion    Hip extension    Hip abduction    Hip adduction    Hip internal rotation    Hip external rotation    Knee flexion    Knee extension    Ankle dorsiflexion 0   Ankle plantarflexion 50   Ankle inversion    Ankle eversion     (Blank rows = not tested)   Upper Extremity MMT:  Flexion 4-BL Abduction 4- BL 4 ER and 4+ IR L  4- ER and 4+ IR R  LOWER EXTREMITY MMT:  MMT Right eval Left eval  Hip flexion    Hip extension    Hip abduction    Hip adduction    Hip internal rotation    Hip external rotation    Knee flexion    Knee extension    Ankle dorsiflexion 3/5   Ankle plantarflexion 3+/5   Ankle inversion    Ankle eversion     (Blank rows = not tested)   FUNCTIONAL TESTS:  Deferred, can integrate at later session  +empty can test on R  GAIT: Distance walked: lobby to treatment area Assistive device utilized: None Level of assistance: Modified independence Comments: dec WB on RLE, altered heel strike, and dec push off on RLE, dec stride length on LLE                                                                                                                                 TREATMENT DATE:   [facility] Adult PT Treatment:                                                DATE: 10/21/24 Therapeutic Exercise: Wall slides scaption x15 BUE  Ankle IV EV x30 Seated piriformis stretch 2x10 in fig 4 Cervical retraction x10 in sitting, improves BUE shoulder symptoms with baseline AROM c/s flexion Levator scap stretch x30s R HEP revised and provided for both UE and LE management  Therapeutic Activity: Discussion and assessment of R ankle and foot based on complaints of inc symptoms following near fall. Pt has slight inc in edema in the retrocalcaneal region. AROM within anticipated range for post operative status. Likely inflammatory, able to weight bear. Inc warmth noted.  Education on inflammatory process, symptom response to activity, anticipated recovery time for current flare up, when to seek further care based on worsening symptoms    10/13/24: Therapeutic Activity: Recert; added in BL shoulders to POC. HEP reassessment and update   10/01/24- EVAL  PATIENT EDUCATION:  Education details: Pt educated on relevant anatomy, physiology, pathology, diagnosis, prognosis, progression of care, pain and activity modification related to R ankle pain and post operative status. Person educated: Patient Education method: Explanation, Demonstration, and Handouts Education comprehension: verbalized understanding and returned demonstration  HOME EXERCISE PROGRAM: Access Code: [email or web address] URL: [email or web address] Date: 10/21/2024 Prepared by: Stann Ohara  Exercises - Side to Side Weight Shift with Unilateral Counter Support  - 1 x daily - 7 x weekly - 1 sets - 30 reps - 2 hold - Seated Heel Raise  - 1 x daily - 7 x weekly - 3 sets - 15 reps - 2 hold - Ankle Inversion Eversion Towel Slide  - 1 x daily - 7 x weekly - 3 sets - 15 reps - 2 hold - Towel Desensitization  - 1 x daily - 7 x weekly - Seated Piriformis Stretch with Trunk Bend  - 1  x daily - 7 x weekly - 1 sets - 30 reps - 2 hold  BL shoulders  Access Code: [email or web address] URL: [email or web address] Date: 10/21/2024 Prepared by: Stann Ohara  Exercises - Shoulder External Rotation with Anchored Resistance  - 2 x daily - 5 x weekly - 2 sets - 6 reps - 3 hold - Standing Bilateral Low Shoulder Row with Anchored Resistance  - 2 x daily - 5 x weekly - 2 sets - 6 reps - 3 hold - Scaption Wall Slide with Towel  - 1 x daily - 4 x weekly - 3 sets - 10-15 reps - 2 hold - Gentle Levator Scapulae Stretch  - 1 x daily - 4 x weekly - 1 sets - 3 reps - 30 s hold - Cervical Retraction with Overpressure  - 3 x daily - 7 x weekly - 1 sets - 10 reps - 2 hold  ASSESSMENT:  CLINICAL IMPRESSION: Pt presents with a near fall earlier this week and has concerns over recent inc in R ankle pain. Discussed mechanisms and symptoms, assessed ROM and there is slight inc in edema in the retrocalcaneal region as well and inc temp to touch, pt reports inc symptoms of pain and pulling with heel off when amb. Discussed symptoms being consistent with inflammatory response. Activity modifications discussed based on this response. Introduced cervical retractions which are classified as provisional classification of cervicogenic symptoms to BUE shoulders. Will continue to monitor and progress as indicated. Pt to follow up next week, cancelled upcoming Monday appt due to weather.    Patient is a 59 y.o. F who was seen today for physical therapy evaluation and treatment for s/p Right leg calcaneal ostectomy, secondary achilles debridement 07/05/24. Pt symptoms are consistent with post operative status. Incision site is healing well, no anchoring noted and appears to be closed with some dry skin noted, pt able to see referring provider prior to this PT eval.  Pt is limited in her ROM, strength, stability, endurance, sensitivity at the operative site, and motor control deficits affect pt function. Pt stands  to benefit from continued skilled physical therapy to address deficit areas and restore safety with activities and participations at home and in the community.    OBJECTIVE IMPAIRMENTS: Abnormal gait, decreased activity tolerance, decreased balance, decreased mobility, difficulty walking, decreased ROM, decreased strength, increased edema, increased fascial restrictions, increased muscle spasms, impaired sensation, impaired UE functional use, and pain.   ACTIVITY LIMITATIONS: carrying, lifting, bending, standing, squatting, and stairs  PARTICIPATION LIMITATIONS: meal prep, cleaning, laundry, driving, shopping, and community activity  PERSONAL FACTORS: Past/current experiences and Time since onset of injury/illness/exacerbation are also affecting patient's functional outcome.   REHAB POTENTIAL: Excellent  CLINICAL DECISION MAKING: Stable/uncomplicated  EVALUATION COMPLEXITY: Low   GOALS: Goals reviewed with patient? Yes  SHORT TERM GOALS: Target date: 11/25/24   Pt will report compliance with HEP to work towards ind and home management strategies Baseline: Goal status: INITIAL   2.  Pt will score no less than 37/80 on LEFS to demonstrate improved activity tolerance Baseline: 27 Goal status: INITIAL   3.  Pt will improve R ankle ROM to full and painless in order to demonstrate progress towards activity tolerance and improved function Baseline:  Goal status: INITIAL   4.  Pt will complete obstacle course of even and uneven surfaces, incline/decline, and elevation change with LRAD and distance no less than 730ft Baseline:  Goal status: INITIAL   5.  Pt will demonstrate no less than 10 degrees DF in R ankle to allow for improved gait mechanics Baseline:  Goal status: INITIAL     LONG TERM GOALS: Target date: 12/30/24   Pt will score no less than 60/80 on LEFS to demonstrate improved activity tolerance Baseline:  Goal status: INITIAL   2.  Pt will report no greater than 0/10  pain over 7 consecutive days to demonstrate maintained reduction in symptoms and improved tolerance to activity Baseline:  Goal status: INITIAL   3.  Pt will be ind in the management of their symptoms at home and in the community Baseline:  Goal status: INITIAL    PLAN:  PT FREQUENCY: 1-2x/week  PT DURATION: 12 weeks  PLANNED INTERVENTIONS: 97110-Therapeutic exercises, 97530- Therapeutic activity, [id number]- Neuromuscular re-education, 97535- Self Care, [id number]- Manual therapy, [id number]- Gait training, [phone]- Orthotic Initial, [phone]- Orthotic/Prosthetic subsequent, [id number]- Electrical stimulation (unattended), 97016- Vasopneumatic device, [phone] (1-2 muscles), [id number] (3+ muscles)- Dry Needling, Patient/Family education, Cryotherapy, and Moist heat  PLAN FOR NEXT SESSION: Modify HEP as needed, progress soft tissue mobility to R calf complex, progress weight bearing tolerance, modalities as indicated, gait training, provider discretion   Stann DELENA Ohara, PT 10/21/2024, 4:50 PM  "

## 2024-10-21 ENCOUNTER — Encounter: Payer: Self-pay | Admitting: Physical Therapy

## 2024-10-21 ENCOUNTER — Ambulatory Visit: Admitting: Physical Therapy

## 2024-10-21 DIAGNOSIS — M6281 Muscle weakness (generalized): Secondary | ICD-10-CM

## 2024-10-21 DIAGNOSIS — G8929 Other chronic pain: Secondary | ICD-10-CM

## 2024-10-21 DIAGNOSIS — R2689 Other abnormalities of gait and mobility: Secondary | ICD-10-CM

## 2024-10-21 DIAGNOSIS — M25571 Pain in right ankle and joints of right foot: Secondary | ICD-10-CM

## 2024-10-25 ENCOUNTER — Ambulatory Visit

## 2024-10-26 ENCOUNTER — Other Ambulatory Visit: Payer: Self-pay

## 2024-10-27 ENCOUNTER — Other Ambulatory Visit: Payer: Self-pay

## 2024-10-27 ENCOUNTER — Telehealth: Payer: Self-pay | Admitting: Registered Nurse

## 2024-10-27 MED ORDER — MECLIZINE HCL 25 MG PO TABS
25.0000 mg | ORAL_TABLET | Freq: Three times a day (TID) | ORAL | 2 refills | Status: AC | PRN
  Filled 2024-10-27: qty 30, 10d supply, fill #0

## 2024-10-27 MED FILL — Tizanidine HCl Tab 2 MG (Base Equivalent): ORAL | 17 days supply | Qty: 50 | Fill #1 | Status: AC

## 2024-10-27 NOTE — Telephone Encounter (Signed)
 Call placed to [name] , her prescription was sent to pharmacy on 09/30/2024.  Call placed to [name] regarding the above, she will call her pharmacy. She verbalizes understanding. She will call office if she has any problems/.

## 2024-10-27 NOTE — Telephone Encounter (Signed)
 Pt called for meds refill hydrocodone 

## 2024-10-28 ENCOUNTER — Ambulatory Visit

## 2024-10-29 ENCOUNTER — Other Ambulatory Visit

## 2024-11-03 ENCOUNTER — Ambulatory Visit: Admitting: Orthopedic Surgery

## 2024-11-12 ENCOUNTER — Ambulatory Visit

## 2024-11-17 ENCOUNTER — Encounter: Admitting: Registered Nurse
